# Patient Record
Sex: Female | Born: 1940 | Race: Black or African American | Hispanic: No | Marital: Married | Smoking: Former smoker
Health system: Southern US, Community
[De-identification: ages and names within clinical notes are randomized; demographics above are authoritative.]

## PROBLEM LIST (undated history)

## (undated) DIAGNOSIS — R0600 Dyspnea, unspecified: Secondary | ICD-10-CM

## (undated) DIAGNOSIS — K219 Gastro-esophageal reflux disease without esophagitis: Secondary | ICD-10-CM

## (undated) DIAGNOSIS — E785 Hyperlipidemia, unspecified: Secondary | ICD-10-CM

## (undated) DIAGNOSIS — I10 Essential (primary) hypertension: Secondary | ICD-10-CM

## (undated) DIAGNOSIS — J849 Interstitial pulmonary disease, unspecified: Secondary | ICD-10-CM

## (undated) DIAGNOSIS — M109 Gout, unspecified: Secondary | ICD-10-CM

## (undated) DIAGNOSIS — Z86010 Personal history of colonic polyps: Secondary | ICD-10-CM

## (undated) DIAGNOSIS — M1990 Unspecified osteoarthritis, unspecified site: Secondary | ICD-10-CM

## (undated) DIAGNOSIS — N189 Chronic kidney disease, unspecified: Secondary | ICD-10-CM

## (undated) DIAGNOSIS — D649 Anemia, unspecified: Secondary | ICD-10-CM

## (undated) DIAGNOSIS — R55 Syncope and collapse: Secondary | ICD-10-CM

## (undated) DIAGNOSIS — R011 Cardiac murmur, unspecified: Secondary | ICD-10-CM

## (undated) DIAGNOSIS — Q249 Congenital malformation of heart, unspecified: Secondary | ICD-10-CM

## (undated) DIAGNOSIS — I5042 Chronic combined systolic (congestive) and diastolic (congestive) heart failure: Secondary | ICD-10-CM

## (undated) DIAGNOSIS — J45909 Unspecified asthma, uncomplicated: Secondary | ICD-10-CM

## (undated) DIAGNOSIS — E538 Deficiency of other specified B group vitamins: Secondary | ICD-10-CM

## (undated) DIAGNOSIS — F419 Anxiety disorder, unspecified: Secondary | ICD-10-CM

## (undated) DIAGNOSIS — J84112 Idiopathic pulmonary fibrosis: Secondary | ICD-10-CM

## (undated) DIAGNOSIS — E669 Obesity, unspecified: Secondary | ICD-10-CM

## (undated) DIAGNOSIS — Z9889 Other specified postprocedural states: Secondary | ICD-10-CM

## (undated) DIAGNOSIS — E559 Vitamin D deficiency, unspecified: Secondary | ICD-10-CM

## (undated) DIAGNOSIS — I358 Other nonrheumatic aortic valve disorders: Secondary | ICD-10-CM

## (undated) DIAGNOSIS — M5136 Other intervertebral disc degeneration, lumbar region: Secondary | ICD-10-CM

## (undated) DIAGNOSIS — Z9289 Personal history of other medical treatment: Secondary | ICD-10-CM

## (undated) DIAGNOSIS — E119 Type 2 diabetes mellitus without complications: Secondary | ICD-10-CM

## (undated) DIAGNOSIS — I5032 Chronic diastolic (congestive) heart failure: Secondary | ICD-10-CM

## (undated) DIAGNOSIS — R0602 Shortness of breath: Secondary | ICD-10-CM

## (undated) DIAGNOSIS — I2510 Atherosclerotic heart disease of native coronary artery without angina pectoris: Secondary | ICD-10-CM

## (undated) HISTORY — DX: Personal history of colonic polyps: Z86.010

## (undated) HISTORY — DX: Syncope and collapse: R55

## (undated) HISTORY — DX: Personal history of other medical treatment: Z92.89

## (undated) HISTORY — DX: Vitamin D deficiency, unspecified: E55.9

## (undated) HISTORY — DX: Other specified postprocedural states: Z98.890

## (undated) HISTORY — PX: REPLACEMENT TOTAL KNEE: SUR1224

## (undated) HISTORY — DX: Obesity, unspecified: E66.9

## (undated) HISTORY — DX: Interstitial pulmonary disease, unspecified: J84.9

## (undated) HISTORY — PX: TOTAL KNEE ARTHROPLASTY: SHX125

## (undated) HISTORY — DX: Congenital malformation of heart, unspecified: Q24.9

## (undated) HISTORY — PX: EYE SURGERY: SHX253

## (undated) HISTORY — DX: Type 2 diabetes mellitus without complications: E11.9

## (undated) HISTORY — PX: JOINT REPLACEMENT: SHX530

## (undated) HISTORY — DX: Dyspnea, unspecified: R06.00

## (undated) HISTORY — PX: BREAST BIOPSY: SHX20

## (undated) HISTORY — DX: Shortness of breath: R06.02

## (undated) HISTORY — DX: Hyperlipidemia, unspecified: E78.5

## (undated) HISTORY — PX: REVISION TOTAL KNEE ARTHROPLASTY: SHX767

## (undated) HISTORY — PX: TONSILLECTOMY: SUR1361

## (undated) HISTORY — DX: Other nonrheumatic aortic valve disorders: I35.8

---

## 2003-04-21 ENCOUNTER — Other Ambulatory Visit: Payer: Self-pay

## 2004-04-10 DIAGNOSIS — M5136 Other intervertebral disc degeneration, lumbar region: Secondary | ICD-10-CM

## 2004-04-10 DIAGNOSIS — Z9889 Other specified postprocedural states: Secondary | ICD-10-CM

## 2004-04-10 DIAGNOSIS — M51369 Other intervertebral disc degeneration, lumbar region without mention of lumbar back pain or lower extremity pain: Secondary | ICD-10-CM

## 2004-04-10 HISTORY — DX: Other specified postprocedural states: Z98.890

## 2004-04-10 HISTORY — PX: BACK SURGERY: SHX140

## 2004-04-10 HISTORY — DX: Other intervertebral disc degeneration, lumbar region: M51.36

## 2004-04-10 HISTORY — PX: CARDIAC CATHETERIZATION: SHX172

## 2004-04-10 HISTORY — DX: Other intervertebral disc degeneration, lumbar region without mention of lumbar back pain or lower extremity pain: M51.369

## 2004-04-12 ENCOUNTER — Ambulatory Visit: Payer: Self-pay | Admitting: Family Medicine

## 2004-09-22 ENCOUNTER — Ambulatory Visit: Payer: Self-pay | Admitting: General Practice

## 2005-10-30 ENCOUNTER — Emergency Department: Payer: Self-pay | Admitting: Emergency Medicine

## 2006-06-06 ENCOUNTER — Ambulatory Visit: Payer: Self-pay

## 2007-04-12 ENCOUNTER — Ambulatory Visit: Payer: Self-pay | Admitting: Family Medicine

## 2007-05-23 ENCOUNTER — Ambulatory Visit: Payer: Self-pay | Admitting: Family Medicine

## 2007-09-02 ENCOUNTER — Emergency Department: Payer: Self-pay | Admitting: Emergency Medicine

## 2008-05-27 DIAGNOSIS — Z9289 Personal history of other medical treatment: Secondary | ICD-10-CM

## 2008-05-27 HISTORY — DX: Personal history of other medical treatment: Z92.89

## 2008-09-30 ENCOUNTER — Ambulatory Visit: Payer: Self-pay | Admitting: Family Medicine

## 2008-12-09 ENCOUNTER — Ambulatory Visit: Payer: Self-pay | Admitting: Ophthalmology

## 2009-09-23 DIAGNOSIS — Z9289 Personal history of other medical treatment: Secondary | ICD-10-CM

## 2009-09-23 HISTORY — DX: Personal history of other medical treatment: Z92.89

## 2009-10-05 ENCOUNTER — Ambulatory Visit: Payer: Self-pay | Admitting: General Practice

## 2009-10-18 ENCOUNTER — Inpatient Hospital Stay: Payer: Self-pay | Admitting: General Practice

## 2010-01-05 ENCOUNTER — Ambulatory Visit: Payer: Self-pay | Admitting: Ophthalmology

## 2010-06-11 ENCOUNTER — Emergency Department: Payer: Self-pay | Admitting: Emergency Medicine

## 2010-07-27 ENCOUNTER — Ambulatory Visit: Payer: Self-pay | Admitting: Family Medicine

## 2010-10-28 DIAGNOSIS — Z860100 Personal history of colon polyps, unspecified: Secondary | ICD-10-CM

## 2010-10-28 DIAGNOSIS — D649 Anemia, unspecified: Secondary | ICD-10-CM

## 2010-10-28 DIAGNOSIS — E119 Type 2 diabetes mellitus without complications: Secondary | ICD-10-CM

## 2010-10-28 DIAGNOSIS — E1165 Type 2 diabetes mellitus with hyperglycemia: Secondary | ICD-10-CM | POA: Insufficient documentation

## 2010-10-28 DIAGNOSIS — E538 Deficiency of other specified B group vitamins: Secondary | ICD-10-CM | POA: Insufficient documentation

## 2010-10-28 DIAGNOSIS — I2510 Atherosclerotic heart disease of native coronary artery without angina pectoris: Secondary | ICD-10-CM

## 2010-10-28 DIAGNOSIS — D638 Anemia in other chronic diseases classified elsewhere: Secondary | ICD-10-CM | POA: Insufficient documentation

## 2010-10-28 DIAGNOSIS — M1712 Unilateral primary osteoarthritis, left knee: Secondary | ICD-10-CM | POA: Insufficient documentation

## 2010-10-28 DIAGNOSIS — E785 Hyperlipidemia, unspecified: Secondary | ICD-10-CM | POA: Insufficient documentation

## 2010-10-28 DIAGNOSIS — M1990 Unspecified osteoarthritis, unspecified site: Secondary | ICD-10-CM

## 2010-10-28 DIAGNOSIS — I10 Essential (primary) hypertension: Secondary | ICD-10-CM

## 2010-10-28 DIAGNOSIS — M109 Gout, unspecified: Secondary | ICD-10-CM

## 2010-10-28 DIAGNOSIS — E669 Obesity, unspecified: Secondary | ICD-10-CM

## 2010-10-28 HISTORY — DX: Type 2 diabetes mellitus without complications: E11.9

## 2010-10-28 HISTORY — DX: Gout, unspecified: M10.9

## 2010-10-28 HISTORY — DX: Deficiency of other specified B group vitamins: E53.8

## 2010-10-28 HISTORY — DX: Anemia, unspecified: D64.9

## 2010-10-28 HISTORY — DX: Hyperlipidemia, unspecified: E78.5

## 2010-10-28 HISTORY — DX: Unspecified osteoarthritis, unspecified site: M19.90

## 2010-10-28 HISTORY — DX: Essential (primary) hypertension: I10

## 2010-10-28 HISTORY — DX: Obesity, unspecified: E66.9

## 2010-10-28 HISTORY — DX: Personal history of colonic polyps: Z86.010

## 2010-10-28 HISTORY — DX: Atherosclerotic heart disease of native coronary artery without angina pectoris: I25.10

## 2010-10-28 HISTORY — DX: Personal history of colon polyps, unspecified: Z86.0100

## 2011-02-22 DIAGNOSIS — E559 Vitamin D deficiency, unspecified: Secondary | ICD-10-CM

## 2011-02-22 HISTORY — DX: Vitamin D deficiency, unspecified: E55.9

## 2011-04-11 DIAGNOSIS — R55 Syncope and collapse: Secondary | ICD-10-CM

## 2011-04-11 HISTORY — DX: Syncope and collapse: R55

## 2011-06-18 ENCOUNTER — Emergency Department: Payer: Self-pay | Admitting: *Deleted

## 2011-08-01 ENCOUNTER — Ambulatory Visit: Payer: Self-pay | Admitting: Family Medicine

## 2012-10-25 ENCOUNTER — Ambulatory Visit: Payer: Self-pay | Admitting: Family Medicine

## 2012-11-07 ENCOUNTER — Ambulatory Visit: Payer: Self-pay | Admitting: Ophthalmology

## 2013-08-26 ENCOUNTER — Emergency Department: Payer: Self-pay | Admitting: Emergency Medicine

## 2013-12-02 DIAGNOSIS — N1832 Chronic kidney disease, stage 3b: Secondary | ICD-10-CM | POA: Insufficient documentation

## 2013-12-16 DIAGNOSIS — J8489 Other specified interstitial pulmonary diseases: Secondary | ICD-10-CM | POA: Insufficient documentation

## 2013-12-26 HISTORY — PX: TOTAL HIP ARTHROPLASTY: SHX124

## 2014-02-23 ENCOUNTER — Ambulatory Visit: Payer: Self-pay | Admitting: Family Medicine

## 2015-08-06 ENCOUNTER — Other Ambulatory Visit: Payer: Self-pay | Admitting: Family Medicine

## 2015-08-06 DIAGNOSIS — Z1231 Encounter for screening mammogram for malignant neoplasm of breast: Secondary | ICD-10-CM

## 2015-08-16 ENCOUNTER — Ambulatory Visit: Payer: Self-pay | Attending: Family Medicine

## 2015-10-21 ENCOUNTER — Ambulatory Visit: Admission: RE | Admit: 2015-10-21 | Payer: Self-pay | Source: Ambulatory Visit

## 2015-12-15 ENCOUNTER — Ambulatory Visit: Admission: RE | Admit: 2015-12-15 | Payer: Self-pay | Source: Ambulatory Visit

## 2015-12-23 ENCOUNTER — Ambulatory Visit
Admission: RE | Admit: 2015-12-23 | Discharge: 2015-12-23 | Disposition: A | Discharge status: Home / Self Care | Payer: Medicare HMO | Source: Ambulatory Visit | Attending: Family Medicine | Admitting: Family Medicine

## 2015-12-23 ENCOUNTER — Other Ambulatory Visit: Payer: Self-pay | Admitting: Family Medicine

## 2015-12-23 DIAGNOSIS — Z1231 Encounter for screening mammogram for malignant neoplasm of breast: Secondary | ICD-10-CM | POA: Diagnosis present

## 2016-11-30 ENCOUNTER — Encounter
Admission: RE | Admit: 2016-11-30 | Discharge: 2016-11-30 | Disposition: A | Discharge status: Home / Self Care | Payer: Medicare HMO | Source: Ambulatory Visit | Attending: Orthopedic Surgery | Admitting: Orthopedic Surgery

## 2016-11-30 DIAGNOSIS — Z0183 Encounter for blood typing: Secondary | ICD-10-CM | POA: Insufficient documentation

## 2016-11-30 DIAGNOSIS — Z01812 Encounter for preprocedural laboratory examination: Secondary | ICD-10-CM | POA: Insufficient documentation

## 2016-11-30 DIAGNOSIS — M1611 Unilateral primary osteoarthritis, right hip: Secondary | ICD-10-CM | POA: Insufficient documentation

## 2016-11-30 HISTORY — DX: Other intervertebral disc degeneration, lumbar region: M51.36

## 2016-11-30 HISTORY — DX: Unspecified osteoarthritis, unspecified site: M19.90

## 2016-11-30 HISTORY — DX: Type 2 diabetes mellitus without complications: E11.9

## 2016-11-30 HISTORY — DX: Chronic kidney disease, unspecified: N18.9

## 2016-11-30 HISTORY — DX: Anemia, unspecified: D64.9

## 2016-11-30 HISTORY — DX: Deficiency of other specified B group vitamins: E53.8

## 2016-11-30 HISTORY — DX: Atherosclerotic heart disease of native coronary artery without angina pectoris: I25.10

## 2016-11-30 HISTORY — DX: Cardiac murmur, unspecified: R01.1

## 2016-11-30 HISTORY — DX: Gout, unspecified: M10.9

## 2016-11-30 HISTORY — DX: Anxiety disorder, unspecified: F41.9

## 2016-11-30 HISTORY — DX: Gastro-esophageal reflux disease without esophagitis: K21.9

## 2016-11-30 HISTORY — DX: Unspecified asthma, uncomplicated: J45.909

## 2016-11-30 HISTORY — DX: Hyperlipidemia, unspecified: E78.5

## 2016-11-30 HISTORY — DX: Essential (primary) hypertension: I10

## 2016-11-30 LAB — CBC
HEMATOCRIT: 34 % — AB (ref 35.0–47.0)
HEMOGLOBIN: 11.4 g/dL — AB (ref 12.0–16.0)
MCH: 33.3 pg (ref 26.0–34.0)
MCHC: 33.5 g/dL (ref 32.0–36.0)
MCV: 99.5 fL (ref 80.0–100.0)
Platelets: 260 10*3/uL (ref 150–440)
RBC: 3.42 MIL/uL — AB (ref 3.80–5.20)
RDW: 14.6 % — ABNORMAL HIGH (ref 11.5–14.5)
WBC: 6.4 10*3/uL (ref 3.6–11.0)

## 2016-11-30 LAB — TYPE AND SCREEN
ABO/RH(D): O POS
ANTIBODY SCREEN: NEGATIVE

## 2016-11-30 LAB — PROTIME-INR
INR: 1.13
Prothrombin Time: 14.6 seconds (ref 11.4–15.2)

## 2016-11-30 LAB — BASIC METABOLIC PANEL
Anion gap: 8 (ref 5–15)
BUN: 20 mg/dL (ref 6–20)
CHLORIDE: 106 mmol/L (ref 101–111)
CO2: 27 mmol/L (ref 22–32)
CREATININE: 1.12 mg/dL — AB (ref 0.44–1.00)
Calcium: 10.1 mg/dL (ref 8.9–10.3)
GFR calc non Af Amer: 46 mL/min — ABNORMAL LOW (ref >60)
GFR, EST AFRICAN AMERICAN: 53 mL/min — AB (ref >60)
GLUCOSE: 124 mg/dL — AB (ref 65–99)
Potassium: 4.7 mmol/L (ref 3.5–5.1)
Sodium: 141 mmol/L (ref 135–145)

## 2016-11-30 LAB — URINALYSIS, COMPLETE (UACMP) WITH MICROSCOPIC
BACTERIA UA: NONE SEEN
BILIRUBIN URINE: NEGATIVE
GLUCOSE, UA: NEGATIVE mg/dL
Hgb urine dipstick: NEGATIVE
Ketones, ur: NEGATIVE mg/dL
Leukocytes, UA: NEGATIVE
NITRITE: NEGATIVE
PH: 6 (ref 5.0–8.0)
Protein, ur: NEGATIVE mg/dL
SPECIFIC GRAVITY, URINE: 1.012 (ref 1.005–1.030)

## 2016-11-30 LAB — SURGICAL PCR SCREEN
MRSA, PCR: NEGATIVE
Staphylococcus aureus: NEGATIVE

## 2016-11-30 LAB — APTT: aPTT: 29 seconds (ref 24–36)

## 2016-11-30 LAB — SEDIMENTATION RATE: Sed Rate: 28 mm/hr (ref 0–30)

## 2016-11-30 NOTE — Patient Instructions (Signed)
Your procedure is scheduled on: December 12, 2016 (TUESDAY) Report to Same Day Surgery 2nd floor medical mall [facility] Entrance-take elevator on left to 2nd floor.  Check in with surgery information desk.) To find out your arrival time please call [phone] between 1PM - 3PM on December 08, 2016 (FRIDAY ) Remember: Instructions that are not followed completely may result in serious medical risk, up to and including death, or upon the discretion of your surgeon and anesthesiologist your surgery may need to be rescheduled.    _x___ 1. Do not eat food or drink liquids after midnight. No gum chewing or hard candies                               __x__ 2. No Alcohol for 24 hours before or after surgery.   __x__3. No Smoking for 24 prior to surgery.   ____  4. Bring all medications with you on the day of surgery if instructed.    __x__ 5. Notify your doctor if there is any change in your medical condition     (cold, fever, infections).     Do not wear jewelry, make-up, hairpins, clips or nail polish.  Do not wear lotions, powders, or perfumes.   Do not shave 48 hours prior to surgery. Men may shave face and neck.  Do not bring valuables to the hospital.    [facility] is not responsible for any belongings or valuables.               Contacts, dentures or bridgework may not be worn into surgery.  Leave your suitcase in the car. After surgery it may be brought to your room.  For patients admitted to the hospital, discharge time is determined by your treatment team                     Patients discharged the day of surgery will not be allowed to drive home.  You will need someone to drive you home and stay with you the night of your procedure.    Please read over the following fact sheets that you were given:   [facility] Preparing for Surgery and or MRSA Information   Take the following medications with a sip of water the morning of surgery :  1. GABAPENTIN  2. LISINOPRIL  3.  METOPROLOL  4. RANITIDINE  5.  6.  ____Fleets enema or Magnesium Citrate as directed.   _x___ Use CHG Soap or sage wipes as directed on instruction sheet   __x__ Use inhalers on the day of surgery and bring to hospital day of surgery (USE ADVAIR AND VENTOLIN  INHALERS THE MORNING OF SURGERY AND BRING INHALERS WITH YOU TO HOSPITAL )  _X___ Stop Metformin  2 days prior to surgery.(STOP METFORMIN ON SEPTEMBER   2 )    ____ Take 1/2 of usual insulin dose the night before surgery and none on the morning surgery     _x___ Follow recommendations from Cardiologist, Pulmonologist or PCP regarding          stopping Aspirin, Coumadin, Plavix ,Eliquis, Effient, or Pradaxa, and Pletal. (STOP ASPIRIN ONE WEEK BEFORE SURGERY )  X____Stop Anti-inflammatories such as Advil, Aleve, Ibuprofen, Motrin, Naproxen, Naprosyn, Goodies powders or aspirin products. OK to take Tylenol    _x___ Stop supplements until after surgery.  But may continue Vitamin D, Vitamin B,  And multivitamin  (STOP CINNAMON AND  OTHER HERBAL SUPPLEMENTS )      ____ Bring C-Pap to the hospital.

## 2016-11-30 NOTE — Pre-Procedure Instructions (Signed)
Component Name Value Ref Range  Vent Rate (bpm) 63   PR Interval (msec) 172   QRS Interval (msec) 78   QT Interval (msec) 416   QTc (msec) 425   Result Narrative  Normal sinus rhythm Minimal voltage criteria for LVH, may be normal variant When compared with ECG of 10-Sep-2013 09:04, Inverted T waves have replaced nonspecific T wave abnormality in Inferior leads I reviewed and concur with this report. Electronically signed by:HENKE MD, ELIZABETH ([id number]) on 11/21/2016 7:33:35 PM  Status

## 2016-12-01 LAB — URINE CULTURE

## 2016-12-02 NOTE — Pre-Procedure Instructions (Signed)
Urine culture results sent to Dr. Menz for review.  Asked if wanted it recollected as suggested? 

## 2016-12-11 MED ORDER — CEFAZOLIN SODIUM-DEXTROSE 2-4 GM/100ML-% IV SOLN
2.0000 g | Freq: Once | INTRAVENOUS | Status: AC
  Administered 2016-12-12: 2 g via INTRAVENOUS

## 2016-12-12 ENCOUNTER — Inpatient Hospital Stay: Payer: Medicare HMO | Admitting: Anesthesiology

## 2016-12-12 ENCOUNTER — Inpatient Hospital Stay: Payer: Medicare HMO

## 2016-12-12 ENCOUNTER — Encounter
Admission: RE | Disposition: A | Discharge status: Skilled Nursing Facility | Payer: Self-pay | Source: Ambulatory Visit | Attending: Orthopedic Surgery

## 2016-12-12 ENCOUNTER — Inpatient Hospital Stay
Admission: RE | Admit: 2016-12-12 | Discharge: 2016-12-14 | DRG: 470 | Disposition: A | Discharge status: Skilled Nursing Facility | Payer: Medicare HMO | Source: Ambulatory Visit | Attending: Orthopedic Surgery | Admitting: Orthopedic Surgery

## 2016-12-12 ENCOUNTER — Encounter: Payer: Self-pay | Admitting: *Deleted

## 2016-12-12 DIAGNOSIS — M1611 Unilateral primary osteoarthritis, right hip: Secondary | ICD-10-CM | POA: Diagnosis present

## 2016-12-12 DIAGNOSIS — G8918 Other acute postprocedural pain: Secondary | ICD-10-CM

## 2016-12-12 DIAGNOSIS — Z79899 Other long term (current) drug therapy: Secondary | ICD-10-CM | POA: Diagnosis not present

## 2016-12-12 DIAGNOSIS — I129 Hypertensive chronic kidney disease with stage 1 through stage 4 chronic kidney disease, or unspecified chronic kidney disease: Secondary | ICD-10-CM | POA: Diagnosis present

## 2016-12-12 DIAGNOSIS — F419 Anxiety disorder, unspecified: Secondary | ICD-10-CM | POA: Diagnosis present

## 2016-12-12 DIAGNOSIS — Z885 Allergy status to narcotic agent status: Secondary | ICD-10-CM

## 2016-12-12 DIAGNOSIS — Z833 Family history of diabetes mellitus: Secondary | ICD-10-CM

## 2016-12-12 DIAGNOSIS — J45909 Unspecified asthma, uncomplicated: Secondary | ICD-10-CM | POA: Diagnosis present

## 2016-12-12 DIAGNOSIS — T40605A Adverse effect of unspecified narcotics, initial encounter: Secondary | ICD-10-CM | POA: Diagnosis not present

## 2016-12-12 DIAGNOSIS — E559 Vitamin D deficiency, unspecified: Secondary | ICD-10-CM | POA: Diagnosis present

## 2016-12-12 DIAGNOSIS — D62 Acute posthemorrhagic anemia: Secondary | ICD-10-CM | POA: Diagnosis not present

## 2016-12-12 DIAGNOSIS — K219 Gastro-esophageal reflux disease without esophagitis: Secondary | ICD-10-CM | POA: Diagnosis present

## 2016-12-12 DIAGNOSIS — E1122 Type 2 diabetes mellitus with diabetic chronic kidney disease: Secondary | ICD-10-CM | POA: Diagnosis present

## 2016-12-12 DIAGNOSIS — Z7982 Long term (current) use of aspirin: Secondary | ICD-10-CM | POA: Diagnosis not present

## 2016-12-12 DIAGNOSIS — Z87891 Personal history of nicotine dependence: Secondary | ICD-10-CM

## 2016-12-12 DIAGNOSIS — R011 Cardiac murmur, unspecified: Secondary | ICD-10-CM | POA: Diagnosis present

## 2016-12-12 DIAGNOSIS — Z972 Presence of dental prosthetic device (complete) (partial): Secondary | ICD-10-CM

## 2016-12-12 DIAGNOSIS — M5136 Other intervertebral disc degeneration, lumbar region: Secondary | ICD-10-CM | POA: Diagnosis present

## 2016-12-12 DIAGNOSIS — Z7984 Long term (current) use of oral hypoglycemic drugs: Secondary | ICD-10-CM

## 2016-12-12 DIAGNOSIS — M109 Gout, unspecified: Secondary | ICD-10-CM | POA: Diagnosis present

## 2016-12-12 DIAGNOSIS — E785 Hyperlipidemia, unspecified: Secondary | ICD-10-CM | POA: Diagnosis present

## 2016-12-12 DIAGNOSIS — D631 Anemia in chronic kidney disease: Secondary | ICD-10-CM | POA: Diagnosis present

## 2016-12-12 DIAGNOSIS — N183 Chronic kidney disease, stage 3 (moderate): Secondary | ICD-10-CM | POA: Diagnosis present

## 2016-12-12 DIAGNOSIS — Z419 Encounter for procedure for purposes other than remedying health state, unspecified: Secondary | ICD-10-CM

## 2016-12-12 DIAGNOSIS — I2510 Atherosclerotic heart disease of native coronary artery without angina pectoris: Secondary | ICD-10-CM | POA: Diagnosis present

## 2016-12-12 DIAGNOSIS — R112 Nausea with vomiting, unspecified: Secondary | ICD-10-CM | POA: Diagnosis not present

## 2016-12-12 DIAGNOSIS — Z96642 Presence of left artificial hip joint: Secondary | ICD-10-CM | POA: Diagnosis present

## 2016-12-12 DIAGNOSIS — E538 Deficiency of other specified B group vitamins: Secondary | ICD-10-CM | POA: Diagnosis present

## 2016-12-12 DIAGNOSIS — Z823 Family history of stroke: Secondary | ICD-10-CM

## 2016-12-12 HISTORY — PX: TOTAL HIP ARTHROPLASTY: SHX124

## 2016-12-12 LAB — CBC
HEMATOCRIT: 32.4 % — AB (ref 35.0–47.0)
HEMOGLOBIN: 10.8 g/dL — AB (ref 12.0–16.0)
MCH: 32.5 pg (ref 26.0–34.0)
MCHC: 33.2 g/dL (ref 32.0–36.0)
MCV: 97.8 fL (ref 80.0–100.0)
Platelets: 271 10*3/uL (ref 150–440)
RBC: 3.32 MIL/uL — ABNORMAL LOW (ref 3.80–5.20)
RDW: 13.8 % (ref 11.5–14.5)
WBC: 13.1 10*3/uL — AB (ref 3.6–11.0)

## 2016-12-12 LAB — GLUCOSE, CAPILLARY
GLUCOSE-CAPILLARY: 196 mg/dL — AB (ref 65–99)
GLUCOSE-CAPILLARY: 200 mg/dL — AB (ref 65–99)
Glucose-Capillary: 132 mg/dL — ABNORMAL HIGH (ref 65–99)
Glucose-Capillary: 163 mg/dL — ABNORMAL HIGH (ref 65–99)
Glucose-Capillary: 145 mg/dL — ABNORMAL HIGH (ref 65–99)

## 2016-12-12 LAB — ABO/RH: ABO/RH(D): O POS

## 2016-12-12 LAB — CREATININE, SERUM
CREATININE: 1.2 mg/dL — AB (ref 0.44–1.00)
GFR, EST AFRICAN AMERICAN: 49 mL/min — AB (ref >60)
GFR, EST NON AFRICAN AMERICAN: 42 mL/min — AB (ref >60)

## 2016-12-12 SURGERY — ARTHROPLASTY, HIP, TOTAL, ANTERIOR APPROACH
Anesthesia: General | Site: Hip | Laterality: Right | Wound class: Clean

## 2016-12-12 MED ORDER — NEOMYCIN-POLYMYXIN B GU 40-200000 IR SOLN
Status: AC
  Filled 2016-12-12: qty 4

## 2016-12-12 MED ORDER — ONDANSETRON HCL 4 MG/2ML IJ SOLN
INTRAMUSCULAR | Status: AC
  Filled 2016-12-12: qty 2

## 2016-12-12 MED ORDER — BUPIVACAINE-EPINEPHRINE 0.25% -1:200000 IJ SOLN
INTRAMUSCULAR | Status: DC | PRN
  Administered 2016-12-12: 30 mL

## 2016-12-12 MED ORDER — INSULIN ASPART 100 UNIT/ML ~~LOC~~ SOLN
0.0000 [IU] | Freq: Three times a day (TID) | SUBCUTANEOUS | Status: DC
  Administered 2016-12-12 (×2): 3 [IU] via SUBCUTANEOUS
  Administered 2016-12-13 (×3): 2 [IU] via SUBCUTANEOUS
  Administered 2016-12-14: 3 [IU] via SUBCUTANEOUS
  Filled 2016-12-12 (×6): qty 1

## 2016-12-12 MED ORDER — FAMOTIDINE 20 MG PO TABS
20.0000 mg | ORAL_TABLET | Freq: Every day | ORAL | Status: DC
  Administered 2016-12-13 – 2016-12-14 (×2): 20 mg via ORAL
  Filled 2016-12-12 (×2): qty 1

## 2016-12-12 MED ORDER — BUPIVACAINE-EPINEPHRINE (PF) 0.25% -1:200000 IJ SOLN
INTRAMUSCULAR | Status: AC
  Filled 2016-12-12: qty 30

## 2016-12-12 MED ORDER — BISACODYL 10 MG RE SUPP: 10.0000 mg | Freq: Every day | RECTAL | Status: DC | PRN

## 2016-12-12 MED ORDER — ALUM & MAG HYDROXIDE-SIMETH 200-200-20 MG/5ML PO SUSP: 30.0000 mL | ORAL | Status: DC | PRN

## 2016-12-12 MED ORDER — ONDANSETRON HCL 4 MG/2ML IJ SOLN
INTRAMUSCULAR | Status: DC | PRN
  Administered 2016-12-12: 4 mg via INTRAVENOUS

## 2016-12-12 MED ORDER — ROCURONIUM BROMIDE 100 MG/10ML IV SOLN
INTRAVENOUS | Status: DC | PRN
  Administered 2016-12-12: 10 mg via INTRAVENOUS
  Administered 2016-12-12: 40 mg via INTRAVENOUS

## 2016-12-12 MED ORDER — ONDANSETRON HCL 4 MG/2ML IJ SOLN: 4.0000 mg | Freq: Once | INTRAMUSCULAR | Status: DC | PRN

## 2016-12-12 MED ORDER — SUGAMMADEX SODIUM 200 MG/2ML IV SOLN
INTRAVENOUS | Status: AC
  Filled 2016-12-12: qty 2

## 2016-12-12 MED ORDER — MENTHOL 3 MG MT LOZG
1.0000 | LOZENGE | OROMUCOSAL | Status: DC | PRN
  Filled 2016-12-12: qty 9

## 2016-12-12 MED ORDER — FENTANYL CITRATE (PF) 100 MCG/2ML IJ SOLN
INTRAMUSCULAR | Status: DC | PRN
  Administered 2016-12-12 (×5): 50 ug via INTRAVENOUS

## 2016-12-12 MED ORDER — ALBUTEROL SULFATE (2.5 MG/3ML) 0.083% IN NEBU: 2.5000 mg | INHALATION_SOLUTION | Freq: Four times a day (QID) | RESPIRATORY_TRACT | Status: DC | PRN

## 2016-12-12 MED ORDER — SODIUM CHLORIDE 0.9 % IV SOLN
INTRAVENOUS | Status: DC
  Administered 2016-12-12: 07:00:00 via INTRAVENOUS

## 2016-12-12 MED ORDER — TRANEXAMIC ACID 1000 MG/10ML IV SOLN
INTRAVENOUS | Status: AC
  Filled 2016-12-12: qty 10

## 2016-12-12 MED ORDER — FENTANYL CITRATE (PF) 100 MCG/2ML IJ SOLN
INTRAMUSCULAR | Status: AC
  Filled 2016-12-12: qty 2

## 2016-12-12 MED ORDER — ACETAMINOPHEN 10 MG/ML IV SOLN
INTRAVENOUS | Status: AC
  Filled 2016-12-12: qty 100

## 2016-12-12 MED ORDER — SODIUM CHLORIDE 0.9 % IV SOLN
INTRAVENOUS | Status: DC | PRN
  Administered 2016-12-12: 50 ug/min via INTRAVENOUS

## 2016-12-12 MED ORDER — LIDOCAINE HCL (PF) 2 % IJ SOLN
INTRAMUSCULAR | Status: AC
  Filled 2016-12-12: qty 2

## 2016-12-12 MED ORDER — FLUTICASONE FUROATE-VILANTEROL 200-25 MCG/INH IN AEPB
1.0000 | INHALATION_SPRAY | Freq: Every day | RESPIRATORY_TRACT | Status: DC
  Administered 2016-12-13 – 2016-12-14 (×2): 1 via RESPIRATORY_TRACT
  Filled 2016-12-12: qty 28

## 2016-12-12 MED ORDER — SODIUM CHLORIDE 0.9 % IV SOLN
INTRAVENOUS | Status: DC
  Administered 2016-12-12 (×2): via INTRAVENOUS

## 2016-12-12 MED ORDER — FENTANYL CITRATE (PF) 250 MCG/5ML IJ SOLN
INTRAMUSCULAR | Status: AC
  Filled 2016-12-12: qty 5

## 2016-12-12 MED ORDER — MIDAZOLAM HCL 2 MG/2ML IJ SOLN
INTRAMUSCULAR | Status: AC
  Filled 2016-12-12: qty 2

## 2016-12-12 MED ORDER — VITAMIN D 1000 UNITS PO TABS
1000.0000 [IU] | ORAL_TABLET | Freq: Every day | ORAL | Status: DC
  Administered 2016-12-13 – 2016-12-14 (×2): 1000 [IU] via ORAL
  Filled 2016-12-12 (×2): qty 1

## 2016-12-12 MED ORDER — ENOXAPARIN SODIUM 40 MG/0.4ML ~~LOC~~ SOLN
40.0000 mg | SUBCUTANEOUS | Status: DC
  Administered 2016-12-13 – 2016-12-14 (×2): 40 mg via SUBCUTANEOUS
  Filled 2016-12-12 (×2): qty 0.4

## 2016-12-12 MED ORDER — SCOPOLAMINE 1 MG/3DAYS TD PT72
1.0000 | MEDICATED_PATCH | TRANSDERMAL | Status: DC
  Administered 2016-12-12: 1.5 mg via TRANSDERMAL
  Filled 2016-12-12: qty 1

## 2016-12-12 MED ORDER — LIDOCAINE HCL (CARDIAC) 20 MG/ML IV SOLN
INTRAVENOUS | Status: DC | PRN
  Administered 2016-12-12: 100 mg via INTRAVENOUS

## 2016-12-12 MED ORDER — METOPROLOL SUCCINATE ER 25 MG PO TB24
25.0000 mg | ORAL_TABLET | Freq: Every day | ORAL | Status: DC
  Administered 2016-12-14: 25 mg via ORAL
  Filled 2016-12-12: qty 1

## 2016-12-12 MED ORDER — METHOCARBAMOL 500 MG PO TABS
500.0000 mg | ORAL_TABLET | Freq: Four times a day (QID) | ORAL | Status: DC | PRN
  Administered 2016-12-12 – 2016-12-13 (×2): 500 mg via ORAL
  Filled 2016-12-12 (×2): qty 1

## 2016-12-12 MED ORDER — SUGAMMADEX SODIUM 200 MG/2ML IV SOLN
INTRAVENOUS | Status: DC | PRN
  Administered 2016-12-12: 195 mg via INTRAVENOUS

## 2016-12-12 MED ORDER — HYDROMORPHONE HCL 1 MG/ML IJ SOLN: 0.5000 mg | INTRAMUSCULAR | Status: DC | PRN

## 2016-12-12 MED ORDER — DIPHENHYDRAMINE HCL 12.5 MG/5ML PO ELIX
12.5000 mg | ORAL_SOLUTION | ORAL | Status: DC | PRN
  Administered 2016-12-12 – 2016-12-14 (×5): 12.5 mg via ORAL
  Filled 2016-12-12 (×3): qty 5
  Filled 2016-12-12: qty 10
  Filled 2016-12-12: qty 5

## 2016-12-12 MED ORDER — ACETAMINOPHEN 650 MG RE SUPP: 650.0000 mg | Freq: Four times a day (QID) | RECTAL | Status: DC | PRN

## 2016-12-12 MED ORDER — TRANEXAMIC ACID 1000 MG/10ML IV SOLN
INTRAVENOUS | Status: DC | PRN
  Administered 2016-12-12: 1000 mg via INTRAVENOUS

## 2016-12-12 MED ORDER — DOCUSATE SODIUM 100 MG PO CAPS
100.0000 mg | ORAL_CAPSULE | Freq: Two times a day (BID) | ORAL | Status: DC
  Administered 2016-12-12 – 2016-12-14 (×4): 100 mg via ORAL
  Filled 2016-12-12 (×4): qty 1

## 2016-12-12 MED ORDER — ALLOPURINOL 300 MG PO TABS
300.0000 mg | ORAL_TABLET | Freq: Every day | ORAL | Status: DC
  Administered 2016-12-13 – 2016-12-14 (×2): 300 mg via ORAL
  Filled 2016-12-12 (×3): qty 1

## 2016-12-12 MED ORDER — PROPOFOL 10 MG/ML IV BOLUS
INTRAVENOUS | Status: DC | PRN
  Administered 2016-12-12: 120 mg via INTRAVENOUS

## 2016-12-12 MED ORDER — ONDANSETRON HCL 4 MG PO TABS: 4.0000 mg | ORAL_TABLET | Freq: Four times a day (QID) | ORAL | Status: DC | PRN

## 2016-12-12 MED ORDER — DEXAMETHASONE SODIUM PHOSPHATE 10 MG/ML IJ SOLN
INTRAMUSCULAR | Status: AC
  Filled 2016-12-12: qty 1

## 2016-12-12 MED ORDER — METHOCARBAMOL 1000 MG/10ML IJ SOLN
500.0000 mg | Freq: Four times a day (QID) | INTRAVENOUS | Status: DC | PRN
  Filled 2016-12-12: qty 5

## 2016-12-12 MED ORDER — PROPOFOL 500 MG/50ML IV EMUL
INTRAVENOUS | Status: AC
  Filled 2016-12-12: qty 50

## 2016-12-12 MED ORDER — ONDANSETRON HCL 4 MG/2ML IJ SOLN
4.0000 mg | Freq: Four times a day (QID) | INTRAMUSCULAR | Status: DC | PRN
  Administered 2016-12-12 (×2): 4 mg via INTRAVENOUS
  Filled 2016-12-12 (×2): qty 2

## 2016-12-12 MED ORDER — METFORMIN HCL 500 MG PO TABS
1000.0000 mg | ORAL_TABLET | Freq: Every day | ORAL | Status: DC
Start: 2016-12-12 — End: 2016-12-14
  Administered 2016-12-13: 1000 mg via ORAL
  Filled 2016-12-12 (×3): qty 2

## 2016-12-12 MED ORDER — LACTATED RINGERS IV SOLN: INTRAVENOUS | Status: DC | PRN

## 2016-12-12 MED ORDER — OMEGA-3-ACID ETHYL ESTERS 1 G PO CAPS
1.0000 g | ORAL_CAPSULE | Freq: Every day | ORAL | Status: DC
  Administered 2016-12-13 – 2016-12-14 (×2): 1 g via ORAL
  Filled 2016-12-12 (×2): qty 1

## 2016-12-12 MED ORDER — METOCLOPRAMIDE HCL 10 MG PO TABS: 5.0000 mg | ORAL_TABLET | Freq: Three times a day (TID) | ORAL | Status: DC | PRN

## 2016-12-12 MED ORDER — ACETAMINOPHEN 325 MG PO TABS
650.0000 mg | ORAL_TABLET | Freq: Four times a day (QID) | ORAL | Status: DC | PRN
  Administered 2016-12-12 – 2016-12-14 (×6): 650 mg via ORAL
  Filled 2016-12-12 (×6): qty 2

## 2016-12-12 MED ORDER — ACETAMINOPHEN 10 MG/ML IV SOLN
INTRAVENOUS | Status: DC | PRN
  Administered 2016-12-12: 1000 mg via INTRAVENOUS

## 2016-12-12 MED ORDER — METOCLOPRAMIDE HCL 5 MG/ML IJ SOLN: 5.0000 mg | Freq: Three times a day (TID) | INTRAMUSCULAR | Status: DC | PRN

## 2016-12-12 MED ORDER — PHENYLEPHRINE HCL 10 MG/ML IJ SOLN
INTRAMUSCULAR | Status: DC | PRN
  Administered 2016-12-12 (×2): 100 ug via INTRAVENOUS

## 2016-12-12 MED ORDER — ZOLPIDEM TARTRATE 5 MG PO TABS: 5.0000 mg | ORAL_TABLET | Freq: Every evening | ORAL | Status: DC | PRN

## 2016-12-12 MED ORDER — OXYCODONE-ACETAMINOPHEN 5-325 MG PO TABS: 1.0000 | ORAL_TABLET | ORAL | Status: DC | PRN

## 2016-12-12 MED ORDER — ALBUTEROL SULFATE HFA 108 (90 BASE) MCG/ACT IN AERS: 1.0000 | INHALATION_SPRAY | Freq: Four times a day (QID) | RESPIRATORY_TRACT | Status: DC | PRN

## 2016-12-12 MED ORDER — LISINOPRIL 20 MG PO TABS: 40.0000 mg | ORAL_TABLET | Freq: Every day | ORAL | Status: DC

## 2016-12-12 MED ORDER — VITAMIN B-12 1000 MCG PO TABS
1000.0000 ug | ORAL_TABLET | Freq: Every day | ORAL | Status: DC
  Administered 2016-12-13 – 2016-12-14 (×2): 1000 ug via ORAL
  Filled 2016-12-12 (×2): qty 1

## 2016-12-12 MED ORDER — SIMVASTATIN 20 MG PO TABS
40.0000 mg | ORAL_TABLET | Freq: Every day | ORAL | Status: DC
  Administered 2016-12-13: 40 mg via ORAL
  Filled 2016-12-12 (×2): qty 2

## 2016-12-12 MED ORDER — ASPIRIN EC 81 MG PO TBEC
81.0000 mg | DELAYED_RELEASE_TABLET | Freq: Every day | ORAL | Status: DC
  Administered 2016-12-13 – 2016-12-14 (×2): 81 mg via ORAL
  Filled 2016-12-12 (×2): qty 1

## 2016-12-12 MED ORDER — DEXAMETHASONE SODIUM PHOSPHATE 10 MG/ML IJ SOLN
INTRAMUSCULAR | Status: DC | PRN
  Administered 2016-12-12: 10 mg via INTRAVENOUS

## 2016-12-12 MED ORDER — GABAPENTIN 300 MG PO CAPS
600.0000 mg | ORAL_CAPSULE | Freq: Three times a day (TID) | ORAL | Status: DC
Start: 2016-12-12 — End: 2016-12-14
  Administered 2016-12-12 – 2016-12-14 (×5): 600 mg via ORAL
  Filled 2016-12-12 (×6): qty 2

## 2016-12-12 MED ORDER — MAGNESIUM CITRATE PO SOLN
1.0000 | Freq: Once | ORAL | Status: DC | PRN
  Filled 2016-12-12: qty 296

## 2016-12-12 MED ORDER — MAGNESIUM HYDROXIDE 400 MG/5ML PO SUSP
30.0000 mL | Freq: Every day | ORAL | Status: DC | PRN
  Filled 2016-12-12: qty 30

## 2016-12-12 MED ORDER — CEFAZOLIN SODIUM-DEXTROSE 2-4 GM/100ML-% IV SOLN
2.0000 g | Freq: Four times a day (QID) | INTRAVENOUS | Status: AC
  Administered 2016-12-12 – 2016-12-13 (×3): 2 g via INTRAVENOUS
  Filled 2016-12-12 (×3): qty 100

## 2016-12-12 MED ORDER — HYDROCHLOROTHIAZIDE 25 MG PO TABS: 25.0000 mg | ORAL_TABLET | Freq: Every day | ORAL | Status: DC

## 2016-12-12 MED ORDER — PHENOL 1.4 % MT LIQD
1.0000 | OROMUCOSAL | Status: DC | PRN
  Filled 2016-12-12: qty 177

## 2016-12-12 MED ORDER — OXYCODONE HCL 5 MG PO TABS
5.0000 mg | ORAL_TABLET | ORAL | Status: DC | PRN
  Administered 2016-12-12: 5 mg via ORAL
  Filled 2016-12-12: qty 1

## 2016-12-12 MED ORDER — FENTANYL CITRATE (PF) 100 MCG/2ML IJ SOLN
25.0000 ug | INTRAMUSCULAR | Status: DC | PRN
  Administered 2016-12-12: 25 ug via INTRAVENOUS

## 2016-12-12 MED ORDER — ROCURONIUM BROMIDE 50 MG/5ML IV SOLN
INTRAVENOUS | Status: AC
  Filled 2016-12-12: qty 1

## 2016-12-12 MED ORDER — NEOMYCIN-POLYMYXIN B GU 40-200000 IR SOLN
Status: DC | PRN
  Administered 2016-12-12: 4 mL

## 2016-12-12 MED ORDER — CEFAZOLIN SODIUM-DEXTROSE 2-4 GM/100ML-% IV SOLN
INTRAVENOUS | Status: AC
  Filled 2016-12-12: qty 100

## 2016-12-12 MED ORDER — SUCCINYLCHOLINE CHLORIDE 20 MG/ML IJ SOLN
INTRAMUSCULAR | Status: AC
  Filled 2016-12-12: qty 1

## 2016-12-12 SURGICAL SUPPLY — 51 items
BLADE SAW SAG 18.5X105 (BLADE) ×2 IMPLANT
BNDG COHESIVE 6X5 TAN STRL LF (GAUZE/BANDAGES/DRESSINGS) ×6 IMPLANT
CANISTER SUCT 1200ML W/VALVE (MISCELLANEOUS) ×2 IMPLANT
CAPT HIP TOTAL HALF DEPUY (Capitated) ×2 IMPLANT
CAPT HIP TOTAL HALF MEDACTA (Capitated) ×2 IMPLANT
CATH FOL LEG HOLDER (MISCELLANEOUS) ×2 IMPLANT
CATH TRAY METER 16FR LF (MISCELLANEOUS) ×2 IMPLANT
CHLORAPREP W/TINT 26ML (MISCELLANEOUS) ×2 IMPLANT
DRAPE C-ARM XRAY 36X54 (DRAPES) ×2 IMPLANT
DRAPE INCISE IOBAN 66X60 STRL (DRAPES) IMPLANT
DRAPE POUCH INSTRU U-SHP 10X18 (DRAPES) ×2 IMPLANT
DRAPE SHEET LG 3/4 BI-LAMINATE (DRAPES) ×6 IMPLANT
DRAPE TABLE BACK 80X90 (DRAPES) ×2 IMPLANT
DRESSING SURGICEL FIBRLLR 1X2 (HEMOSTASIS) ×2 IMPLANT
DRSG OPSITE POSTOP 4X8 (GAUZE/BANDAGES/DRESSINGS) ×4 IMPLANT
DRSG SURGICEL FIBRILLAR 1X2 (HEMOSTASIS) ×4
ELECT BLADE 6.5 EXT (BLADE) ×2 IMPLANT
ELECT REM PT RETURN 9FT ADLT (ELECTROSURGICAL) ×2
ELECTRODE REM PT RTRN 9FT ADLT (ELECTROSURGICAL) ×1 IMPLANT
EVACUATOR 1/8 PVC DRAIN (DRAIN) IMPLANT
GLOVE BIOGEL PI IND STRL 9 (GLOVE) ×1 IMPLANT
GLOVE BIOGEL PI INDICATOR 9 (GLOVE) ×1
GLOVE SURG SYN 9.0  PF PI (GLOVE) ×2
GLOVE SURG SYN 9.0 PF PI (GLOVE) ×2 IMPLANT
GOWN SRG 2XL LVL 4 RGLN SLV (GOWNS) ×1 IMPLANT
GOWN STRL NON-REIN 2XL LVL4 (GOWNS) ×1
GOWN STRL REUS W/ TWL LRG LVL3 (GOWN DISPOSABLE) ×1 IMPLANT
GOWN STRL REUS W/TWL LRG LVL3 (GOWN DISPOSABLE) ×1
HOOD PEEL AWAY FLYTE STAYCOOL (MISCELLANEOUS) ×2 IMPLANT
KIT PREVENA INCISION MGT 13 (CANNISTER) ×2 IMPLANT
MAT BLUE FLOOR 46X72 FLO (MISCELLANEOUS) ×2 IMPLANT
NDL SAFETY 18GX1.5 (NEEDLE) ×2 IMPLANT
NEEDLE SPNL 18GX3.5 QUINCKE PK (NEEDLE) ×2 IMPLANT
NS IRRIG 1000ML POUR BTL (IV SOLUTION) ×2 IMPLANT
PACK HIP COMPR (MISCELLANEOUS) ×2 IMPLANT
SOL PREP PVP 2OZ (MISCELLANEOUS) ×2
SOLUTION PREP PVP 2OZ (MISCELLANEOUS) ×1 IMPLANT
SPONGE DRAIN TRACH 4X4 STRL 2S (GAUZE/BANDAGES/DRESSINGS) ×2 IMPLANT
STAPLER SKIN PROX 35W (STAPLE) ×2 IMPLANT
STRAP SAFETY BODY (MISCELLANEOUS) ×2 IMPLANT
SUT DVC 2 QUILL PDO  T11 36X36 (SUTURE) ×1
SUT DVC 2 QUILL PDO T11 36X36 (SUTURE) ×1 IMPLANT
SUT SILK 0 (SUTURE) ×1
SUT SILK 0 30XBRD TIE 6 (SUTURE) ×1 IMPLANT
SUT V-LOC 90 ABS DVC 3-0 CL (SUTURE) ×2 IMPLANT
SUT VIC AB 1 CT1 36 (SUTURE) ×2 IMPLANT
SYR 20CC LL (SYRINGE) ×2 IMPLANT
SYR 30ML LL (SYRINGE) ×2 IMPLANT
TAPE MICROFOAM 4IN (TAPE) ×2 IMPLANT
TOWEL OR 17X26 4PK STRL BLUE (TOWEL DISPOSABLE) ×2 IMPLANT
WND VAC CANISTER 500ML (MISCELLANEOUS) ×2 IMPLANT

## 2016-12-12 NOTE — Care Management Note (Signed)
Case Management Note  Patient Details  Name: Sierra NajjarRena M Guzman MRN: [id number] Date of Birth: [birth year]  Subjective/Objective: POD # 0 THA right hip. RNCM consult for discharge planning. PT pending.                   Action/Plan: Following progression  Expected Discharge Date:                  Expected Discharge Plan:     In-House Referral:     Discharge planning Services  CM Consult  Post Acute Care Choice:    Choice offered to:     DME Arranged:    DME Agency:     HH Arranged:    HH Agency:     Status of Service:  In process, will continue to follow  If discussed at Long Length of Stay Meetings, dates discussed:    Additional Comments:  Marily MemosLisa M Atul Delucia, RN 12/12/2016, 11:09 AM

## 2016-12-12 NOTE — Op Note (Signed)
12/12/2016  9:15 AM  PATIENT:  Sierra Guzman  76 y.o. female  PRE-OPERATIVE DIAGNOSIS:  primary localized osteoarthritis right hip  POST-OPERATIVE DIAGNOSIS:  primary localized osteoarthritis right hip  PROCEDURE:  Procedure(s): TOTAL HIP ARTHROPLASTY ANTERIOR APPROACH (Right)  SURGEON: Leitha SchullerMichael J Aundre Hietala, MD  ASSISTANTS: none  ANESTHESIA:   general  EBL:  Total I/O In: -  Out: 400 [Urine:200; Blood:200]  BLOOD ADMINISTERED:none  DRAINS: none   LOCAL MEDICATIONS USED:  MARCAINE     SPECIMEN:  Source of Specimen:  right femoral head   DISPOSITION OF SPECIMEN:  PATHOLOGY  COUNTS:  YES  TOURNIQUET:  * No tourniquets in log *  IMPLANTS: Medacta 52 DM Mpact cup and liner, DePuy Actis 7 atandard stem with 1.5 mm 28 mm metal head  DICTATION: .Dragon Dictation   The patient was brought to the operating room and after spinal anesthesia was obtained patient was placed on the operative table with the ipsilateral foot into the Medacta attachment, contralateral leg on a well-padded table. C-arm was brought in and preop template x-ray taken. After prepping and draping in usual sterile fashion appropriate patient identification and timeout procedures were completed. Anterior approach to the hip was obtained and centered over the greater trochanter and TFL muscle. The subcutaneous tissue was incised hemostasis being achieved by electrocautery. TFL fascia was incised and the muscle retracted laterally deep retractor placed. The lateral femoral circumflex vessels were identified and ligated. The anterior capsule was exposed and a capsulotomy performed. The neck was identified and a femoral neck cut carried out with a saw. The head was removed without difficulty and showed sclerotic femoral head and acetabulum. Reaming was carried out to 52 mm and a 52 mm cup trial gave appropriate tightness to the acetabular component a 52 cup was impacted into position. The leg was then externally rotated and  ischiofemoral and pubofemoral releases carried out. The femur was sequentially broached to a size 7, size 7 standard with +1.5  trials were placed and the final components chosen. The 7 standard stem was inserted along with a +1.5  28 mm head and 52 mm liner. The hip was reduced and was stable the wound was thoroughly irrigated with fibrilar placed along posterior capsule incision. The deep fascia was closed using a heavy Quill after infiltration of 30 cc of quarter percent Sensorcaine with epinephrine. 2-0 vicryl to close the skin with skin staples and an incisional wound vac applied  PLAN OF CARE: Admit to inpatient

## 2016-12-12 NOTE — Clinical Social Work Placement (Signed)
   CLINICAL SOCIAL WORK PLACEMENT  NOTE  Date:  12/12/2016  Patient Details  Name: Otho NajjarRena M Moure MRN: [id number] Date of Birth: June 06, 1940  Clinical Social Work is seeking post-discharge placement for this patient at the Skilled  Nursing Facility level of care (*CSW will initial, date and re-position this form in  chart as items are completed):  Yes   Patient/family provided with [place] Clinical Social Work Department's list of facilities offering this level of care within the geographic area requested by the patient (or if unable, by the patient's family).  Yes   Patient/family informed of their freedom to choose among providers that offer the needed level of care, that participate in Medicare, Medicaid or managed care program needed by the patient, have an available bed and are willing to accept the patient.  Yes   Patient/family informed of [facility] ownership interest in [facility] and [facility], as well as of the fact that they are under no obligation to receive care at these facilities.  PASRR submitted to EDS on       PASRR number received on       Existing PASRR number confirmed on 12/12/16     FL2 transmitted to all facilities in geographic area requested by pt/family on 12/12/16     FL2 transmitted to all facilities within larger geographic area on       Patient informed that his/her managed care company has contracts with or will negotiate with certain facilities, including the following:        Yes   Patient/family informed of bed offers received.  Patient chooses bed at  [facility]     Physician recommends and patient chooses bed at      Patient to be transferred to   on  .  Patient to be transferred to facility by       Patient family notified on   of transfer.  Name of family member notified:        PHYSICIAN       Additional Comment:    _______________________________________________ Ramsay Bognar, Darleen CrockerBailey M, LCSW 12/12/2016, 2:42  PM

## 2016-12-12 NOTE — Progress Notes (Signed)
Pt nauseous with  400 ml emesis. MD Rosita KeaMenz notified, MD ordering scopolamine patch.

## 2016-12-12 NOTE — Clinical Social Work Note (Signed)
Clinical Social Work Assessment  Patient Details  Name: Sierra Guzman MRN: [id number] Date of Birth: 07-25-1940  Date of referral:  12/12/16               Reason for consult:  Facility Placement                Permission sought to share information with:  Chartered certified accountant granted to share information::  Yes, Verbal Permission Granted  Name::      [place]::   [place]   Relationship::     Contact Information:     Housing/Transportation Living arrangements for the past 2 months:  [place] of Information:  Patient Patient Interpreter Needed:  None Criminal Activity/Legal Involvement Pertinent to Current Situation/Hospitalization:  No - Comment as needed Significant Relationships:  Adult Children, Other Family Members Lives with:  Self, Adult Children Do you feel safe going back to the place where you live?  Yes Need for family participation in patient care:  Yes (Comment)  Care giving concerns:  Patient lives in [place], [place] and usually stays alone however her daughter Sierra Guzman is temporally staying with her.    Social Worker assessment / plan:  Holiday representative (CSW) received SNF consult. PT is recommending SNF. CSW met with patient and her sister in law Sierra Guzman was at bedside. Patient was alert and oriented X4 and was sitting up in the chair at bedside. CSW introduced self and explained role of CSW department. Patient reported that she lives alone however her daughter is staying with her for a little while. CSW explained SNF process and that Mcarthur Rossetti will have to approve SNF. Patient is agreeable to SNF search in [facility] and prefers [facility]. FL2 complete and faxed out.   CSW presented bed offers to patient and she chose [facility]. Patient is okay with a semi-private room at [facility]. CSW will start [place] authorization through [facility] tomorrow on post op day 1. [facility] admissions  coordinator at [facility] is aware of above. CSW will continue to follow and assist as needed.    Employment status:  Retired Nurse, adult PT Recommendations:  [place] / Referral to community resources:  [place]  Patient/Family's Response to care:  Patient is agreeable to go to [facility] for short term rehab.   Patient/Family's Understanding of and Emotional Response to Diagnosis, Current Treatment, and Prognosis:  Patient was very pleasant and thanked CSW for assistance.   Emotional Assessment Appearance:  Appears stated age Attitude/Demeanor/Rapport:    Affect (typically observed):  Accepting, Adaptable, Pleasant Orientation:  Oriented to Self, Oriented to Place, Oriented to  Time, Oriented to Situation Alcohol / Substance use:  Not Applicable Psych involvement (Current and /or in the community):  No (Comment)  Discharge Needs  Concerns to be addressed:  Discharge Planning Concerns Readmission within the last 30 days:  No Current discharge risk:  Dependent with Mobility Barriers to Discharge:  Continued Medical Work up   [employer], Veronia Beets, LCSW 12/12/2016, 2:43 PM

## 2016-12-12 NOTE — Anesthesia Preprocedure Evaluation (Addendum)
Anesthesia Evaluation  Patient identified by MRN, date of birth, ID band Patient awake    Reviewed: Allergy & Precautions, NPO status , Patient's Chart, lab work & pertinent test results  Airway Mallampati: II  TM Distance: >3 FB     Dental  (+) Upper Dentures, Lower Dentures   Pulmonary asthma , former smoker,    Pulmonary exam normal        Cardiovascular hypertension, Pt. on medications + CAD  Normal cardiovascular exam+ Valvular Problems/Murmurs      Neuro/Psych Anxiety    GI/Hepatic GERD  ,  Endo/Other  diabetes, Well Controlled, Type 2, Oral Hypoglycemic Agents  Renal/GU Renal disease  negative genitourinary   Musculoskeletal  (+) Arthritis , Osteoarthritis,    Abdominal Normal abdominal exam  (+)   Peds negative pediatric ROS (+)  Hematology  (+) anemia ,   Anesthesia Other Findings Past Medical History: No date: Anemia No date: Anxiety No date: Arthritis No date: Asthma No date: Chronic kidney disease     Comment:  Chronic Kidney Disease---Stage III No date: Coronary artery disease No date: DDD (degenerative disc disease), lumbar No date: Diabetes mellitus without complication (HCC) No date: GERD (gastroesophageal reflux disease) No date: Gout No date: Heart murmur No date: Hyperlipidemia No date: Hypertension No date: Osteoarthritis No date: Vitamin B 12 deficiency  Reproductive/Obstetrics                            Anesthesia Physical Anesthesia Plan  ASA: III  Anesthesia Plan: General   Post-op Pain Management:    Induction:   PONV Risk Score and Plan:   Airway Management Planned: Oral ETT  Additional Equipment:   Intra-op Plan:   Post-operative Plan: Extubation in OR  Informed Consent: I have reviewed the patients History and Physical, chart, labs and discussed the procedure including the risks, benefits and alternatives for the proposed anesthesia  with the patient or authorized representative who has indicated his/her understanding and acceptance.   Dental advisory given  Plan Discussed with: CRNA and Surgeon  Anesthesia Plan Comments:         Anesthesia Quick Evaluation

## 2016-12-12 NOTE — Evaluation (Signed)
Physical Therapy Evaluation Patient Details Name: Sierra Guzman MRN: [id number] DOB: 1940/05/13 Today's Date: 12/12/2016   History of Present Illness  Pt underwent R THR anterior approach with post-op nausea. She is POD#0 at time of PT evaluation. PMH includes DMII, CKD, anxiety, hyperlipidemia, GERD, HTN, bilateral TKR, L THR, and CAD.   Clinical Impression  Pt admitted with above diagnosis. Pt currently with functional limitations due to the deficits listed below (see PT Problem List).  Pt requiring minA+1 for bed mobility and transfers. She is able to take short, shuffling steps to transfer from bed to recliner. Pt moves very slow and demonstrates decreased weight shifting to RLE. Immediately following ambulation pt becomes very nauseated and proceeds to vomit approximately of fluid. RN notified of vomiting and pt requesting benedryl due to feeling "itchy." Pt demonstrates RLE weakness and limited mobility on this date. Current recommendation is for SNF placement. Will continue to follow and update DC recommendations as appropriate.  Pt will benefit from PT services to address deficits in strength, balance, and mobility in order to return to full function at home.      Follow Up Recommendations SNF    Equipment Recommendations  Rolling walker with 5" wheels    Recommendations for Other Services OT consult     Precautions / Restrictions Precautions Precautions: Anterior Hip Precaution Booklet Issued: Yes (comment) Restrictions Weight Bearing Restrictions: Yes RLE Weight Bearing: Weight bearing as tolerated      Mobility  Bed Mobility Overal bed mobility: Needs Assistance Bed Mobility: Supine to Sit     Supine to sit: Min assist     General bed mobility comments: Prior to bed mobility pt placed on room air. SaO2 drops to 85% and doesn't recover until supplemental O2 reapplied. Pt requiring minA+1 for RLE management when moving toward EOB. HOB elevated and bed rails utilized.  Good UE strength noted. Once upright at EOB pt reports feeling dizzy. Vitals obtained and are WNL.  Transfers Overall transfer level: Needs assistance Equipment used: Rolling walker (2 wheeled) Transfers: Sit to/from Stand Sit to Stand: Min assist         General transfer comment: Pt moves slowly and requires minA+1 due to poor RLE strength and decreased weight acceptance. Once upright she is steady and stable with UE support on rolling walker  Ambulation/Gait Ambulation/Gait assistance: Min guard Ambulation Distance (Feet): 3 Feet Assistive device: Rolling walker (2 wheeled) Gait Pattern/deviations: Decreased weight shift to right;Decreased stance time - right;Decreased step length - left;Step-to pattern Gait velocity: Decreased Gait velocity interpretation: <1.8 ft/sec, indicative of risk for recurrent falls General Gait Details: Pt able to take short, shuffling steps to transfer from bed to recliner. Pt moves very slow and demonstrates decreased weight shifting to RLE. Immediately following ambulation pt becomes very nauseated and proceeds to vomit approximately of fluid. RN notified of vomiting and pt requesting benedryl due to feeling "itchy." RN arrives and administers medication  Stairs            Wheelchair Mobility    Modified Rankin (Stroke Patients Only)       Balance Overall balance assessment: Needs assistance Sitting-balance support: No upper extremity supported Sitting balance-Leahy Scale: Good     Standing balance support: Bilateral upper extremity supported Standing balance-Leahy Scale: Fair Standing balance comment: Pt able to remain in standing with bilateral UE support on rolling walker  Pertinent Vitals/Pain Pain Assessment: 0-10 Pain Score: 9  Pain Location: R hip Pain Descriptors / Indicators: Operative site guarding Pain Intervention(s): Monitored during session;Premedicated before session     Home Living Family/patient expects to be discharged to:: Skilled nursing facility Living Arrangements: Children Available Help at Discharge: Family Type of Home: House Home Access: Ramped entrance     Home Layout: One level;Other (Comment) ("a few steps down into laundry room.") Home Equipment: Cane - single point;Shower seat;Grab bars - tub/shower      Prior Function Level of Independence: Needs assistance   Gait / Transfers Assistance Needed: Independent for ambulation with prn use of spc. No falls  ADL's / Homemaking Assistance Needed: Independent with ADLs, assist for IADLs from family        Hand Dominance   Dominant Hand: Right    Extremity/Trunk Assessment   Upper Extremity Assessment Upper Extremity Assessment: Overall WFL for tasks assessed    Lower Extremity Assessment Lower Extremity Assessment: RLE deficits/detail RLE Deficits / Details: Requires 2 finger assist for RLE SLR. Reports intact sensation to light touch. Full DF/PF.        Communication   Communication: No difficulties  Cognition Arousal/Alertness: Lethargic Behavior During Therapy: WFL for tasks assessed/performed Overall Cognitive Status: Within Functional Limits for tasks assessed                                        General Comments      Exercises Total Joint Exercises Ankle Circles/Pumps: AROM;Both;10 reps;Supine Quad Sets: Strengthening;Both;10 reps;Supine Gluteal Sets: Strengthening;Both;10 reps;Supine Towel Squeeze: Strengthening;Both;10 reps;Supine Short Arc Quad: Strengthening;Right;10 reps;Supine Heel Slides: Strengthening;Right;10 reps;Supine Hip ABduction/ADduction: Strengthening;Right;10 reps;Supine Straight Leg Raises: Strengthening;Right;10 reps;Supine   Assessment/Plan    PT Assessment Patient needs continued PT services  PT Problem List Decreased strength;Decreased activity tolerance;Decreased balance;Decreased mobility;Pain       PT  Treatment Interventions DME instruction;Gait training;Stair training;Functional mobility training;Therapeutic activities;Therapeutic exercise;Balance training;Neuromuscular re-education;Patient/family education;Manual techniques    PT Goals (Current goals can be found in the Care Plan section)  Acute Rehab PT Goals Patient Stated Goal: "I need to go to rehab." Pt eager to return to prior level of function PT Goal Formulation: With patient/family Time For Goal Achievement: 12/26/16 Potential to Achieve Goals: Good    Frequency BID   Barriers to discharge        Co-evaluation               AM-PAC PT "6 Clicks" Daily Activity  Outcome Measure Difficulty turning over in bed (including adjusting bedclothes, sheets and blankets)?: Unable Difficulty moving from lying on back to sitting on the side of the bed? : Unable Difficulty sitting down on and standing up from a chair with arms (e.g., wheelchair, bedside commode, etc,.)?: Unable Help needed moving to and from a bed to chair (including a wheelchair)?: A Little Help needed walking in hospital room?: A Little Help needed climbing 3-5 steps with a railing? : A Lot 6 Click Score: 11    End of Session Equipment Utilized During Treatment: Gait belt;Oxygen Activity Tolerance: Patient tolerated treatment well Patient left: with call bell/phone within reach;in chair;with chair alarm set;with family/visitor present Nurse Communication: Mobility status;Other (comment) (nausea and vomiting, feeling "itchy") PT Visit Diagnosis: Unsteadiness on feet (R26.81);Muscle weakness (generalized) (M62.81);Pain Pain - Right/Left: Right Pain - part of body: Hip    Time: [id number] PT Time Calculation (min) (ACUTE  ONLY): 38 min   Charges:   PT Evaluation $PT Eval Low Complexity: 1 Low PT Treatments $Therapeutic Exercise: 8-22 mins   PT G Codes:   PT G-Codes **NOT FOR INPATIENT CLASS** Functional Assessment Tool Used: AM-PAC 6 Clicks Basic  Mobility Functional Limitation: Mobility: Walking and moving around Mobility: Walking and Moving Around Current Status (W2956): At least 60 percent but less than 80 percent impaired, limited or restricted Mobility: Walking and Moving Around Goal Status [phone]): At least 20 percent but less than 40 percent impaired, limited or restricted    Lynnea Maizes PT, DPT    Huprich,Jason 12/12/2016, 2:38 PM

## 2016-12-12 NOTE — Anesthesia Post-op Follow-up Note (Signed)
Anesthesia QCDR form completed.        

## 2016-12-12 NOTE — Transfer of Care (Signed)
Immediate Anesthesia Transfer of Care Note  Patient: Sierra NajjarRena M Rosero  Procedure(s) Performed: Procedure(s): TOTAL HIP ARTHROPLASTY ANTERIOR APPROACH (Right)  Patient Location: PACU  Anesthesia Type:General  Level of Consciousness: awake, oriented and sedated  Airway & Oxygen Therapy: Patient Spontanous Breathing and Patient connected to face mask oxygen  Post-op Assessment: Report given to RN and Post -op Vital signs reviewed and stable  Post vital signs: Reviewed and stable  Last Vitals:  Vitals:   12/12/16 0618  BP: (!) 178/69  Pulse: 72  Temp: (!) 36.4 C  SpO2: 98%    Last Pain:  Vitals:   12/12/16 0618  TempSrc: Oral         Complications: No apparent anesthesia complications

## 2016-12-12 NOTE — Anesthesia Procedure Notes (Signed)
Procedure Name: Intubation Date/Time: 12/12/2016 8:49 AM Performed by: Nelda Marseille Pre-anesthesia Checklist: Patient identified, Patient being monitored, Timeout performed, Emergency Drugs available and Suction available Patient Re-evaluated:Patient Re-evaluated prior to induction Oxygen Delivery Method: Circle system utilized Preoxygenation: Pre-oxygenation with 100% oxygen Induction Type: IV induction Ventilation: Mask ventilation without difficulty Laryngoscope Size: Mac and 3 Grade View: Grade I Tube type: Oral Tube size: 7.0 mm Number of attempts: 1 Airway Equipment and Method: Stylet Placement Confirmation: ETT inserted through vocal cords under direct vision,  positive ETCO2 and breath sounds checked- equal and bilateral Secured at: 21 cm Tube secured with: Tape Dental Injury: Teeth and Oropharynx as per pre-operative assessment

## 2016-12-12 NOTE — Progress Notes (Signed)
Pt stating oxycodone makes her sick. Pt requesting Percocet. MD Rosita KeaMenz notified. Orders received for Percocet 5-325 mg. Order placed.

## 2016-12-12 NOTE — [place] FL2 (Signed)
[place] MEDICAID FL2 LEVEL OF CARE SCREENING TOOL     IDENTIFICATION  Patient Name: Sierra NajjarRena M Guzman Birthdate: 07-May-1940 Sex: female Admission Date (Current Location): 12/12/2016  [place]   Facility and Address:  [facility], [address], [place], [place] [address]      Provider Number: [id number]  Attending Physician Name and Address:  Kennedy BuckerMenz, Michael, MD  Relative Name and Phone Number:       Current Level of Care: Hospital Recommended Level of Care: Skilled Nursing Facility Prior Approval Number:    Date Approved/Denied:   PASRR Number:  [id number] )  Discharge Plan: SNF    Current Diagnoses: Patient Active Problem List   Diagnosis Date Noted  . Primary localized osteoarthritis of right hip 12/12/2016    Orientation RESPIRATION BLADDER Height & Weight     Self, Time, Situation, Place  O2 (2.5 Liters ) Continent Weight:   Height:     BEHAVIORAL SYMPTOMS/MOOD NEUROLOGICAL BOWEL NUTRITION STATUS   (none)  (none) Continent Diet (Diet: Clear Liquid to be Advanced. )  AMBULATORY STATUS COMMUNICATION OF NEEDS Skin   Extensive Assist Verbally Surgical wounds, Wound Vac (Incision: Right Hip. Provena wound vac. )                       Personal Care Assistance Level of Assistance  Bathing, Feeding, Dressing Bathing Assistance: Limited assistance Feeding assistance: Independent Dressing Assistance: Limited assistance     Functional Limitations Info  Sight, Hearing, Speech Sight Info: Adequate Hearing Info: Adequate Speech Info: Adequate    SPECIAL CARE FACTORS FREQUENCY  PT (By licensed PT), OT (By licensed OT)     PT Frequency:  (5) OT Frequency:  (5)            Contractures      Additional Factors Info  Code Status, Allergies Code Status Info:  (Full Code. ) Allergies Info:  (Morphine and Related )           Current Medications (12/12/2016):  This is the current hospital active  medication list Current Facility-Administered Medications  Medication Dose Route Frequency Provider Last Rate Last Dose  . 0.9 %  sodium chloride infusion   Intravenous Continuous Kennedy BuckerMenz, Michael, MD      . acetaminophen (TYLENOL) tablet 650 mg  650 mg Oral Q6H PRN Kennedy BuckerMenz, Michael, MD       Or  . acetaminophen (TYLENOL) suppository 650 mg  650 mg Rectal Q6H PRN Kennedy BuckerMenz, Michael, MD      . albuterol (PROVENTIL) (2.5 MG/3ML) 0.083% nebulizer solution 2.5 mg  2.5 mg Nebulization Q6H PRN Kennedy BuckerMenz, Michael, MD      . allopurinol (ZYLOPRIM) tablet 300 mg  300 mg Oral Daily Kennedy BuckerMenz, Michael, MD      . alum & mag hydroxide-simeth (MAALOX/MYLANTA) 200-200-20 MG/5ML suspension 30 mL  30 mL Oral Q4H PRN Kennedy BuckerMenz, Michael, MD      . aspirin EC tablet 81 mg  81 mg Oral Daily Kennedy BuckerMenz, Michael, MD      . bisacodyl (DULCOLAX) suppository 10 mg  10 mg Rectal Daily PRN Kennedy BuckerMenz, Michael, MD      . ceFAZolin (ANCEF) IVPB 2g/100 mL premix  2 g Intravenous Q6H Kennedy BuckerMenz, Michael, MD      . cholecalciferol (VITAMIN D) tablet 1,000 Units  1,000 Units Oral Daily Kennedy BuckerMenz, Michael, MD      . diphenhydrAMINE (BENADRYL) 12.5 MG/5ML elixir 12.5-25 mg  12.5-25 mg Oral Q4H PRN  Kennedy Bucker, MD      . docusate sodium (COLACE) capsule 100 mg  100 mg Oral BID Kennedy Bucker, MD      . Melene Muller ON 12/13/2016] enoxaparin (LOVENOX) injection 40 mg  40 mg Subcutaneous Q24H Kennedy Bucker, MD      . Melene Muller ON 12/13/2016] famotidine (PEPCID) tablet 20 mg  20 mg Oral Daily Kennedy Bucker, MD      . fentaNYL (SUBLIMAZE) 100 MCG/2ML injection           . fluticasone furoate-vilanterol (BREO ELLIPTA) 200-25 MCG/INH 1 puff  1 puff Inhalation Daily Kennedy Bucker, MD      . gabapentin (NEURONTIN) capsule 600 mg  600 mg Oral TID Kennedy Bucker, MD      . hydrochlorothiazide (HYDRODIURIL) tablet 25 mg  25 mg Oral Daily Kennedy Bucker, MD      . HYDROmorphone (DILAUDID) injection 0.5 mg  0.5 mg Intravenous Q2H PRN Kennedy Bucker, MD      . insulin aspart (novoLOG) injection 0-15 Units   0-15 Units Subcutaneous TID WC Kennedy Bucker, MD      . Melene Muller ON 12/13/2016] lisinopril (PRINIVIL,ZESTRIL) tablet 40 mg  40 mg Oral Daily Kennedy Bucker, MD      . magnesium citrate solution 1 Bottle  1 Bottle Oral Once PRN Kennedy Bucker, MD      . magnesium hydroxide (MILK OF MAGNESIA) suspension 30 mL  30 mL Oral Daily PRN Kennedy Bucker, MD      . menthol-cetylpyridinium (CEPACOL) lozenge 3 mg  1 lozenge Oral PRN Kennedy Bucker, MD       Or  . phenol (CHLORASEPTIC) mouth spray 1 spray  1 spray Mouth/Throat PRN Kennedy Bucker, MD      . metFORMIN (GLUCOPHAGE) tablet 1,000 mg  1,000 mg Oral Q supper Kennedy Bucker, MD      . methocarbamol (ROBAXIN) tablet 500 mg  500 mg Oral Q6H PRN Kennedy Bucker, MD       Or  . methocarbamol (ROBAXIN) 500 mg in dextrose 5 % 50 mL IVPB  500 mg Intravenous Q6H PRN Kennedy Bucker, MD      . metoCLOPramide (REGLAN) tablet 5-10 mg  5-10 mg Oral Q8H PRN Kennedy Bucker, MD       Or  . metoCLOPramide (REGLAN) injection 5-10 mg  5-10 mg Intravenous Q8H PRN Kennedy Bucker, MD      . Melene Muller ON 12/13/2016] metoprolol succinate (TOPROL-XL) 24 hr tablet 25 mg  25 mg Oral Daily Kennedy Bucker, MD      . omega-3 acid ethyl esters (LOVAZA) capsule 1 g  1 g Oral Daily Kennedy Bucker, MD      . ondansetron [facility]) tablet 4 mg  4 mg Oral Q6H PRN Kennedy Bucker, MD       Or  . ondansetron [facility]) injection 4 mg  4 mg Intravenous Q6H PRN Kennedy Bucker, MD      . oxyCODONE (Oxy IR/ROXICODONE) immediate release tablet 5-10 mg  5-10 mg Oral Q3H PRN Kennedy Bucker, MD      . simvastatin (ZOCOR) tablet 40 mg  40 mg Oral q1800 Kennedy Bucker, MD      . vitamin B-12 (CYANOCOBALAMIN) tablet 1,000 mcg  1,000 mcg Oral Daily Kennedy Bucker, MD      . zolpidem (AMBIEN) tablet 5 mg  5 mg Oral QHS PRN Kennedy Bucker, MD         Discharge Medications: Please see discharge summary for a list of discharge medications.  Relevant Imaging Results:  Relevant Lab Results:   Additional Information  (SSN:  [id number])  Joziyah Roblero, Darleen Crocker, LCSW

## 2016-12-12 NOTE — H&P (Signed)
Reviewed paper H+P, will be scanned into chart. No changes noted.  

## 2016-12-12 NOTE — Anesthesia Postprocedure Evaluation (Signed)
Anesthesia Post Note  Patient: Sierra NajjarRena M Guzman  Procedure(s) Performed: Procedure(s) (LRB): TOTAL HIP ARTHROPLASTY ANTERIOR APPROACH (Right)  Patient location during evaluation: PACU Anesthesia Type: General Level of consciousness: awake and alert and oriented Pain management: pain level controlled Vital Signs Assessment: post-procedure vital signs reviewed and stable Respiratory status: spontaneous breathing Cardiovascular status: blood pressure returned to baseline Anesthetic complications: no     Last Vitals:  Vitals:   12/12/16 1350 12/12/16 1442  BP: 131/73 (!) 114/56  Pulse: 69 67  Resp:  18  Temp:  36.4 C  SpO2: 97%     Last Pain:  Vitals:   12/12/16 1442  TempSrc: Oral  PainSc:                  Sierra Guzman

## 2016-12-13 ENCOUNTER — Encounter: Payer: Self-pay | Admitting: Orthopedic Surgery

## 2016-12-13 LAB — CBC
HCT: 28.1 % — ABNORMAL LOW (ref 35.0–47.0)
HEMOGLOBIN: 9.4 g/dL — AB (ref 12.0–16.0)
MCH: 33.1 pg (ref 26.0–34.0)
MCHC: 33.6 g/dL (ref 32.0–36.0)
MCV: 98.6 fL (ref 80.0–100.0)
PLATELETS: 217 10*3/uL (ref 150–440)
RBC: 2.85 MIL/uL — AB (ref 3.80–5.20)
RDW: 13.5 % (ref 11.5–14.5)
WBC: 10.1 10*3/uL (ref 3.6–11.0)

## 2016-12-13 LAB — BASIC METABOLIC PANEL
Anion gap: 7 (ref 5–15)
BUN: 21 mg/dL — ABNORMAL HIGH (ref 6–20)
CHLORIDE: 108 mmol/L (ref 101–111)
CO2: 23 mmol/L (ref 22–32)
CREATININE: 1.19 mg/dL — AB (ref 0.44–1.00)
Calcium: 9 mg/dL (ref 8.9–10.3)
GFR, EST AFRICAN AMERICAN: 50 mL/min — AB (ref >60)
GFR, EST NON AFRICAN AMERICAN: 43 mL/min — AB (ref >60)
Glucose, Bld: 169 mg/dL — ABNORMAL HIGH (ref 65–99)
Potassium: 4.3 mmol/L (ref 3.5–5.1)
SODIUM: 138 mmol/L (ref 135–145)

## 2016-12-13 LAB — GLUCOSE, CAPILLARY
GLUCOSE-CAPILLARY: 129 mg/dL — AB (ref 65–99)
GLUCOSE-CAPILLARY: 175 mg/dL — AB (ref 65–99)
GLUCOSE-CAPILLARY: 182 mg/dL — AB (ref 65–99)
Glucose-Capillary: 134 mg/dL — ABNORMAL HIGH (ref 65–99)

## 2016-12-13 MED ORDER — FE FUMARATE-B12-VIT C-FA-IFC PO CAPS
1.0000 | ORAL_CAPSULE | Freq: Two times a day (BID) | ORAL | Status: DC
  Administered 2016-12-13 – 2016-12-14 (×3): 1 via ORAL
  Filled 2016-12-13 (×4): qty 1

## 2016-12-13 NOTE — Progress Notes (Signed)
Pt has remained alert and oriented this shift. Medicated for pain with Tylenol with good results. Pt is REFUSING all narcotic medication this shift for the fear of emesis. Pt states that she does not do well with Narcotic medication. Tramadol was mentioned as an alternative and pt did not want md called for tramadol order. She states that tramadol also makes her vomit. Dressing dry and intact. Foley patent and draining urine. No drainage in wound vac. Pt able to sleep in between care. No nausea or vomitting during the night. Pt able to tolerate sips of clear liquids through the shift.

## 2016-12-13 NOTE — Progress Notes (Signed)
[facility], auth # [id number]. Plan is for patient to [facility] pending medical clearance. Patient is aware of above. [facility] coordinator [facility] is aware of above.   [employer]Bailey Payne Garske, LCSW [phone]

## 2016-12-13 NOTE — Progress Notes (Signed)
   Subjective: 1 Day Post-Op Procedure(s) (LRB): TOTAL HIP ARTHROPLASTY ANTERIOR APPROACH (Right) Patient reports pain as moderate.   Patient is well, and has had no acute complaints or problems. Not tolerating narcotics due to nausea. Requesting to only take tylenol for pain.  Denies any CP, SOB, ABD pain. We will continue therapy today.  Plan is to go Skilled nursing facility after hospital stay.  Objective: Vital signs in last 24 hours: Temp:  [97.5 F (36.4 C)-99.1 F (37.3 C)] 99.1 F (37.3 C) (09/05 0414) Pulse Rate:  [54-83] 64 (09/05 0414) Resp:  [12-20] 18 (09/05 0414) BP: (104-150)/(54-99) 105/54 (09/05 0414) SpO2:  [96 %-100 %] 100 % (09/05 0414) Weight:  [97.5 kg (215 lb)] 97.5 kg (215 lb) (09/04 1132)  Intake/Output from previous day: 09/04 0701 - 09/05 0700 In: 2696.7 [P.O.:760; I.V.:1636.7; IV Piggyback:300] Out: 2575 [Urine:1975; Emesis/NG output:400; Blood:200] Intake/Output this shift: No intake/output data recorded.   Recent Labs  12/12/16 1052 12/13/16 0354  HGB 10.8* 9.4*    Recent Labs  12/12/16 1052 12/13/16 0354  WBC 13.1* 10.1  RBC 3.32* 2.85*  HCT 32.4* 28.1*  PLT 271 217    Recent Labs  12/12/16 1052 12/13/16 0354  NA  --  138  K  --  4.3  CL  --  108  CO2  --  23  BUN  --  21*  CREATININE 1.20* 1.19*  GLUCOSE  --  169*  CALCIUM  --  9.0   No results for input(s): LABPT, INR in the last 72 hours.  EXAM General - Patient is Alert, Appropriate and Oriented Extremity - Neurovascular intact Sensation intact distally Intact pulses distally Dorsiflexion/Plantar flexion intact No cellulitis present Compartment soft Dressing - dressing C/D/I, no drainage and wound vac intact, no drainage Motor Function - intact, moving foot and toes well on exam.   Past Medical History:  Diagnosis Date  . Anemia   . Anxiety   . Arthritis   . Asthma   . Chronic kidney disease    Chronic Kidney Disease---Stage III  . Coronary artery  disease   . DDD (degenerative disc disease), lumbar   . Diabetes mellitus without complication (HCC)   . GERD (gastroesophageal reflux disease)   . Gout   . Heart murmur   . Hyperlipidemia   . Hypertension   . Osteoarthritis   . Vitamin B 12 deficiency     Assessment/Plan:   1 Day Post-Op Procedure(s) (LRB): TOTAL HIP ARTHROPLASTY ANTERIOR APPROACH (Right) Active Problems:   Primary localized osteoarthritis of right hip  Estimated body mass index is 33.67 kg/m as calculated from the following:   Height as of this encounter: 5\' 7"  (1.702 m).   Weight as of this encounter: 97.5 kg (215 lb). Advance diet Up with therapy  Needs BM Acute post op blood loss anemia with underlying chronic anemia, start Iron  Recheck labs in the am CM to assist with discharge Encouraged incentive spirometer Continue with IV fluids, monitor BP  DVT Prophylaxis - Lovenox, Foot Pumps and TED hose Weight-Bearing as tolerated to right leg   T. Cranston Neighborhris Gaines, PA-C [facility] 12/13/2016, 8:19 AM

## 2016-12-13 NOTE — Progress Notes (Signed)
Physical Therapy Treatment Patient Details Name: Sierra Guzman MRN: [id number] DOB: 09-07-1940 Today's Date: 12/13/2016    History of Present Illness Pt underwent R THR anterior approach with post-op nausea. She is POD#0 at time of PT evaluation. PMH includes DMII, CKD, anxiety, hyperlipidemia, GERD, HTN, bilateral TKR, L THR, and CAD.     PT Comments    Pt continues to make progress with physical therapy but is fatigued with ambulation and only able to slightly increase her distance during PM session. She is able to complete all seated exercises with improving R hip flexion strength. Reports increased pain this afternoon reporting 8/10 R hip pain at start of session. Pt will benefit from PT services to address deficits in strength, balance, and mobility in order to return to full function at home.      Follow Up Recommendations  SNF     Equipment Recommendations  Rolling walker with 5" wheels    Recommendations for Other Services OT consult     Precautions / Restrictions Precautions Precautions: Anterior Hip Precaution Booklet Issued: Yes (comment) Restrictions Weight Bearing Restrictions: Yes RLE Weight Bearing: Weight bearing as tolerated    Mobility  Bed Mobility Overal bed mobility: Needs Assistance Bed Mobility: Sit to Supine     Supine to sit: Min assist     General bed mobility comments: Pt continues to require bilateral LE assist when returning to bed  Transfers Overall transfer level: Needs assistance Equipment used: Rolling walker (2 wheeled) Transfers: Sit to/from Stand Sit to Stand: Min guard         General transfer comment: Pt continues to demontrate improved safety awareness and stability with transfers. Continues to require UE assist for transfers  Ambulation/Gait Ambulation/Gait assistance: Min guard Ambulation Distance (Feet): 80 Feet Assistive device: Rolling walker (2 wheeled) Gait Pattern/deviations: Decreased weight shift to  right;Decreased stance time - right;Decreased step length - left;Step-to pattern Gait velocity: Decreased Gait velocity interpretation: <1.8 ft/sec, indicative of risk for recurrent falls General Gait Details: Pt able to slightly increase her ambulation distance this afternoon but unable to go farther. Pt continually monitored for fatigue. Improved step length and progression to partial step-through pattern again this afternoon   Stairs            Wheelchair Mobility    Modified Rankin (Stroke Patients Only)       Balance Overall balance assessment: Needs assistance Sitting-balance support: No upper extremity supported Sitting balance-Leahy Scale: Good     Standing balance support: Bilateral upper extremity supported Standing balance-Leahy Scale: Fair Standing balance comment: Pt able to remain in standing with bilateral UE support on rolling walker                            Cognition Arousal/Alertness: Lethargic Behavior During Therapy: WFL for tasks assessed/performed Overall Cognitive Status: Within Functional Limits for tasks assessed                                        Exercises Total Joint Exercises Ankle Circles/Pumps: AROM;Both;10 reps;Seated;Other (comment) (Heel raises) Quad Sets: Strengthening;Both;10 reps;Supine Gluteal Sets: Strengthening;Both;10 reps;Supine Towel Squeeze: Strengthening;Both;10 reps;Supine Short Arc Quad: Strengthening;Right;10 reps;Supine Heel Slides: Strengthening;Right;10 reps;Supine Hip ABduction/ADduction: Strengthening;10 reps;Both;Seated Straight Leg Raises: Strengthening;Right;10 reps;Supine Long Arc Quad: Strengthening;Right;10 reps;Seated Knee Flexion: Strengthening;Right;10 reps;Seated Marching in Standing: Strengthening;Both;10 reps;Seated    General Comments  Pertinent Vitals/Pain Pain Assessment: 0-10 Pain Score: 8  Pain Location: R hip Pain Descriptors / Indicators: Operative  site guarding    Home Living                      Prior Function            PT Goals (current goals can now be found in the care plan section) Acute Rehab PT Goals Patient Stated Goal: "I need to go to rehab." Pt eager to return to prior level of function PT Goal Formulation: With patient/family Time For Goal Achievement: 12/26/16 Potential to Achieve Goals: Good Progress towards PT goals: Progressing toward goals    Frequency    BID      PT Plan Current plan remains appropriate    Co-evaluation              AM-PAC PT "6 Clicks" Daily Activity  Outcome Measure  Difficulty turning over in bed (including adjusting bedclothes, sheets and blankets)?: Unable Difficulty moving from lying on back to sitting on the side of the bed? : Unable Difficulty sitting down on and standing up from a chair with arms (e.g., wheelchair, bedside commode, etc,.)?: A Little Help needed moving to and from a bed to chair (including a wheelchair)?: A Little Help needed walking in hospital room?: A Little Help needed climbing 3-5 steps with a railing? : A Lot 6 Click Score: 13    End of Session Equipment Utilized During Treatment: Gait belt Activity Tolerance: Patient tolerated treatment well Patient left: with family/visitor present;in bed;with bed alarm set;with call bell/phone within reach;with SCD's reapplied Nurse Communication: Other (comment) (Ready for bath, BSC needs to be emptied) PT Visit Diagnosis: Unsteadiness on feet (R26.81);Muscle weakness (generalized) (M62.81);Pain Pain - Right/Left: Right Pain - part of body: Hip     Time: [id number] PT Time Calculation (min) (ACUTE ONLY): 36 min  Charges:  $Gait Training: 8-22 mins $Therapeutic Exercise: 8-22 mins                    G Codes:       Sharalyn InkJason D Angelea Penny PT, DPT     [name] 12/13/2016, 2:41 PM

## 2016-12-13 NOTE — Progress Notes (Signed)
Clinical Child psychotherapistocial Worker (CSW) faxed in clinicals to [facility] [employer] today to start [place]. [facility] pending medical clearance and [facility] authorization.  [employer]Bailey Gyasi Hazzard, LCSW [phone]

## 2016-12-13 NOTE — Progress Notes (Signed)
Per conversation with MD, held BP meds this am 

## 2016-12-13 NOTE — Progress Notes (Signed)
Pt pain 5/10 refusing to take any narcotic medications tonight. Pt states that they make her very sick. Pt would like to stick with just Tylenol for pain control.

## 2016-12-13 NOTE — Progress Notes (Signed)
Physical Therapy Treatment Patient Details Name: Sierra NajjarRena M Guzman MRN: [id number] DOB: Sep 16, 1940 Today's Date: 12/13/2016    History of Present Illness Pt underwent R THR anterior approach with post-op nausea. She is POD#0 at time of PT evaluation. PMH includes DMII, CKD, anxiety, hyperlipidemia, GERD, HTN, bilateral TKR, L THR, and CAD.     PT Comments    Pt making good progress with therapy on this date. She continues to require assist for LE management during bed mobility. Improved sequencing with transfers. She is able to ambulate from room to RN station and back to bed. Gait is slow but steady and pt is stable with use of rolling walker. Education/cues provided for proper sequencing with rolling walker. Step-to pattern initially but progresses with cues to partial step-through pattern. She is able to complete bed exercises as instructed. Pt will benefit from PT services to address deficits in strength, balance, and mobility in order to return to full function at home.   Follow Up Recommendations  SNF     Equipment Recommendations  Rolling walker with 5" wheels    Recommendations for Other Services OT consult     Precautions / Restrictions Precautions Precautions: Anterior Hip Precaution Booklet Issued: Yes (comment) Restrictions Weight Bearing Restrictions: Yes RLE Weight Bearing: Weight bearing as tolerated    Mobility  Bed Mobility Overal bed mobility: Needs Assistance Bed Mobility: Supine to Sit     Supine to sit: Min assist     General bed mobility comments: Pt requiring assist for LE management during bed mobility when exiting on the R side. HOB elevated and use of bed rails  Transfers Overall transfer level: Needs assistance Equipment used: Rolling walker (2 wheeled) Transfers: Sit to/from Stand Sit to Stand: Min guard         General transfer comment: Pt moves slowly but is steady. Improved weight acceptance to RLE. Steady and stable in  standing  Ambulation/Gait Ambulation/Gait assistance: Min guard Ambulation Distance (Feet): 50 Feet Assistive device: Rolling walker (2 wheeled) Gait Pattern/deviations: Decreased weight shift to right;Decreased stance time - right;Decreased step length - left;Step-to pattern Gait velocity: Decreased Gait velocity interpretation: <1.8 ft/sec, indicative of risk for recurrent falls General Gait Details: Pt able to ambulate from room to RN station and back. Gait is slow but steady and pt is stable with use of rolling walker. Education/cues provided for proper sequencing with rolling walker. Step-to pattern initially but progresses with cues to partial step-through pattern   Stairs            Wheelchair Mobility    Modified Rankin (Stroke Patients Only)       Balance Overall balance assessment: Needs assistance Sitting-balance support: No upper extremity supported Sitting balance-Leahy Scale: Good     Standing balance support: Bilateral upper extremity supported Standing balance-Leahy Scale: Fair Standing balance comment: Pt able to remain in standing with bilateral UE support on rolling walker                            Cognition Arousal/Alertness: Lethargic Behavior During Therapy: WFL for tasks assessed/performed Overall Cognitive Status: Within Functional Limits for tasks assessed                                        Exercises Total Joint Exercises Ankle Circles/Pumps: AROM;Both;10 reps;Supine Quad Sets: Strengthening;Both;10 reps;Supine Gluteal Sets: Strengthening;Both;10 reps;Supine  Towel Squeeze: Strengthening;Both;10 reps;Supine Short Arc Quad: Strengthening;Right;10 reps;Supine Heel Slides: Strengthening;Right;10 reps;Supine Hip ABduction/ADduction: Strengthening;Right;10 reps;Supine Straight Leg Raises: Strengthening;Right;10 reps;Supine    General Comments        Pertinent Vitals/Pain Pain Assessment: 0-10 Pain Score: 3   Pain Location: R hip Pain Descriptors / Indicators: Operative site guarding    Home Living                      Prior Function            PT Goals (current goals can now be found in the care plan section) Acute Rehab PT Goals Patient Stated Goal: "I need to go to rehab." Pt eager to return to prior level of function PT Goal Formulation: With patient/family Time For Goal Achievement: 12/26/16 Potential to Achieve Goals: Good Progress towards PT goals: Progressing toward goals    Frequency    BID      PT Plan Current plan remains appropriate    Co-evaluation              AM-PAC PT "6 Clicks" Daily Activity  Outcome Measure  Difficulty turning over in bed (including adjusting bedclothes, sheets and blankets)?: Unable Difficulty moving from lying on back to sitting on the side of the bed? : Unable Difficulty sitting down on and standing up from a chair with arms (e.g., wheelchair, bedside commode, etc,.)?: A Little Help needed moving to and from a bed to chair (including a wheelchair)?: A Little Help needed walking in hospital room?: A Little Help needed climbing 3-5 steps with a railing? : A Lot 6 Click Score: 13    End of Session Equipment Utilized During Treatment: Gait belt Activity Tolerance: Patient tolerated treatment well Patient left: with call bell/phone within reach;in chair;with chair alarm set;with family/visitor present   PT Visit Diagnosis: Unsteadiness on feet (R26.81);Muscle weakness (generalized) (M62.81);Pain Pain - Right/Left: Right Pain - part of body: Hip     Time: 1610-9604 PT Time Calculation (min) (ACUTE ONLY): 30 min  Charges:  $Gait Training: 8-22 mins $Therapeutic Exercise: 8-22 mins                    G Codes:      Sierra Guzman PT, DPT     Sierra Guzman 12/13/2016, 11:16 AM

## 2016-12-14 ENCOUNTER — Encounter
Admission: RE | Admit: 2016-12-14 | Discharge: 2016-12-14 | Disposition: A | Discharge status: Home / Self Care | Payer: Medicare HMO | Source: Ambulatory Visit | Attending: Internal Medicine | Admitting: Internal Medicine

## 2016-12-14 LAB — CBC
HEMATOCRIT: 26.8 % — AB (ref 35.0–47.0)
Hemoglobin: 9.2 g/dL — ABNORMAL LOW (ref 12.0–16.0)
MCH: 33.6 pg (ref 26.0–34.0)
MCHC: 34.4 g/dL (ref 32.0–36.0)
MCV: 97.8 fL (ref 80.0–100.0)
PLATELETS: 197 10*3/uL (ref 150–440)
RBC: 2.74 MIL/uL — AB (ref 3.80–5.20)
RDW: 13.8 % (ref 11.5–14.5)
WBC: 10.2 10*3/uL (ref 3.6–11.0)

## 2016-12-14 LAB — BASIC METABOLIC PANEL
ANION GAP: 6 (ref 5–15)
BUN: 21 mg/dL — AB (ref 6–20)
CO2: 24 mmol/L (ref 22–32)
Calcium: 8.8 mg/dL — ABNORMAL LOW (ref 8.9–10.3)
Chloride: 106 mmol/L (ref 101–111)
Creatinine, Ser: 1.29 mg/dL — ABNORMAL HIGH (ref 0.44–1.00)
GFR calc Af Amer: 45 mL/min — ABNORMAL LOW (ref >60)
GFR, EST NON AFRICAN AMERICAN: 39 mL/min — AB (ref >60)
GLUCOSE: 169 mg/dL — AB (ref 65–99)
POTASSIUM: 3.9 mmol/L (ref 3.5–5.1)
Sodium: 136 mmol/L (ref 135–145)

## 2016-12-14 LAB — SURGICAL PATHOLOGY

## 2016-12-14 LAB — GLUCOSE, CAPILLARY
GLUCOSE-CAPILLARY: 122 mg/dL — AB (ref 65–99)
Glucose-Capillary: 172 mg/dL — ABNORMAL HIGH (ref 65–99)

## 2016-12-14 MED ORDER — METHOCARBAMOL 500 MG PO TABS: 500.0000 mg | ORAL_TABLET | Freq: Four times a day (QID) | ORAL | 0 refills | Status: DC | PRN

## 2016-12-14 MED ORDER — BISACODYL 10 MG RE SUPP: 10.0000 mg | Freq: Every day | RECTAL | 0 refills | Status: DC | PRN

## 2016-12-14 MED ORDER — ENOXAPARIN SODIUM 40 MG/0.4ML ~~LOC~~ SOLN: 40.0000 mg | SUBCUTANEOUS | 0 refills | Status: DC

## 2016-12-14 MED ORDER — DOCUSATE SODIUM 100 MG PO CAPS: 100.0000 mg | ORAL_CAPSULE | Freq: Two times a day (BID) | ORAL | 0 refills | Status: DC

## 2016-12-14 MED ORDER — SODIUM CHLORIDE 0.9 % IV BOLUS (SEPSIS)
500.0000 mL | Freq: Once | INTRAVENOUS | Status: AC
  Administered 2016-12-14: 500 mL via INTRAVENOUS

## 2016-12-14 MED ORDER — ONDANSETRON HCL 4 MG PO TABS: 4.0000 mg | ORAL_TABLET | Freq: Four times a day (QID) | ORAL | 0 refills | Status: DC | PRN

## 2016-12-14 MED ORDER — ACETAMINOPHEN 500 MG PO TABS: 1000.0000 mg | ORAL_TABLET | Freq: Four times a day (QID) | ORAL | 0 refills | Status: DC | PRN

## 2016-12-14 NOTE — Progress Notes (Signed)
Physical Therapy Treatment Patient Details Name: Sierra Guzman MRN: [id number] DOB: [birth year] Today's Date: 12/14/2016    History of Present Illness 76 y/o female w/p R THR anterior approach 9/4.PMH includes DMII, CKD, anxiety, hyperlipidemia, GERD, HTN, bilateral TKR, L THR, and CAD.     PT Comments    Pt did well with PT despite c/o pain t/o session.  She showed increased bed mobility and strength with some exercises but did not feel as though she could increase ambulation distance.  She did have increased confidence and speed with ambulation today, but is still hesitant and reliant on the walker.  Pt eager to get to rehab and continue to PT as her pain levels decrease.     Follow Up Recommendations  SNF     Equipment Recommendations       Recommendations for Other Services       Precautions / Restrictions Precautions Precautions: Anterior Hip Restrictions Weight Bearing Restrictions: Yes RLE Weight Bearing: Weight bearing as tolerated    Mobility  Bed Mobility Overal bed mobility: Needs Assistance Bed Mobility: Sit to Supine     Supine to sit: Min guard     General bed mobility comments: Pt was able to use L LE to assist R to EOB, slow/deliberate with the effort but able to get to sitting w/o direct physical assist  Transfers Overall transfer level: Needs assistance Equipment used: Rolling walker (2 wheeled) Transfers: Sit to/from Stand Sit to Stand: Min guard         General transfer comment: reminders for positioning and sequencing, able to rise w/o assist, despite cuing she did "flop" back into seat after ambulation  Ambulation/Gait Ambulation/Gait assistance: Min guard Ambulation Distance (Feet): 80 Feet Assistive device: Rolling walker (2 wheeled)       General Gait Details: Pt with slow but safe ambulation.  She was fatigued with the effort (O2 did drop to 88% on room air, increased to mid/high 90s once sitting).  She did not feel able to go any  further than previous ambulation attempts, but ultimately was safe and showed increased confidence despite fatigue.    Stairs            Wheelchair Mobility    Modified Rankin (Stroke Patients Only)       Balance Overall balance assessment: Needs assistance Sitting-balance support: No upper extremity supported Sitting balance-Leahy Scale: Good     Standing balance support: Bilateral upper extremity supported Standing balance-Leahy Scale: Fair Standing balance comment: Good balance with walker use, some unsteadiness with transition to sitting/moving UEs                            Cognition Arousal/Alertness: Awake/alert Behavior During Therapy: WFL for tasks assessed/performed Overall Cognitive Status: Within Functional Limits for tasks assessed                                        Exercises Total Joint Exercises Ankle Circles/Pumps: 20 reps;Strengthening Quad Sets: 15 reps;Strengthening Gluteal Sets: 15 reps;Strengthening Short Arc Quad: Strengthening;15 reps Heel Slides: Strengthening;10 reps Hip ABduction/ADduction: Strengthening;15 reps Straight Leg Raises: AAROM;10 reps    General Comments        Pertinent Vitals/Pain Pain Assessment: 0-10 Pain Score: 4  Pain Location: R hip, reports some increased sore with activity and WBing but is able to participate fully  Home Living                      Prior Function            PT Goals (current goals can now be found in the care plan section) Progress towards PT goals: Progressing toward goals    Frequency    BID      PT Plan Current plan remains appropriate    Co-evaluation              AM-PAC PT "6 Clicks" Daily Activity  Outcome Measure  Difficulty turning over in bed (including adjusting bedclothes, sheets and blankets)?: A Lot Difficulty moving from lying on back to sitting on the side of the bed? : A Lot Difficulty sitting down on and  standing up from a chair with arms (e.g., wheelchair, bedside commode, etc,.)?: A Little Help needed moving to and from a bed to chair (including a wheelchair)?: A Little Help needed walking in hospital room?: A Little Help needed climbing 3-5 steps with a railing? : A Lot 6 Click Score: 15    End of Session Equipment Utilized During Treatment: Gait belt Activity Tolerance: Patient tolerated treatment well Patient left: with family/visitor present;in bed;with bed alarm set;with call bell/phone within reach;with SCD's reapplied Nurse Communication: Mobility status PT Visit Diagnosis: Unsteadiness on feet (R26.81);Muscle weakness (generalized) (M62.81);Pain Pain - Right/Left: Right Pain - part of body: Hip     Time: [id number] PT Time Calculation (min) (ACUTE ONLY): 29 min  Charges:  $Gait Training: 8-22 mins $Therapeutic Exercise: 8-22 mins                    G Codes:       Malachi ProGalen R Khaleed Holan, DPT 12/14/2016, 11:16 AM

## 2016-12-14 NOTE — Care Management Important Message (Signed)
Important Message  Patient Details  Name: Otho NajjarRena M Samet MRN: [id number] Date of Birth: [birth year]   Medicare Important Message Given:  N/A - LOS <3 / Initial given by admissions    Marily MemosLisa M Josanna Hefel, RN 12/14/2016, 2:58 PM

## 2016-12-14 NOTE — Progress Notes (Signed)
Patient is being [facility]. Wound vac changed over to prevena. Family made aware. IV removed with cath intact. Report called. Sent paperwork,  prevena booklet, and scripts sent with patient.

## 2016-12-14 NOTE — Progress Notes (Signed)
Patient's BP 100/55 this am, no IV fluids running, HCTZ and Lisinopril ordered.  Notified MD. New orders to hold BP meds and give 500 NS bolus.

## 2016-12-14 NOTE — Clinical Social Work Placement (Signed)
   CLINICAL SOCIAL WORK PLACEMENT  NOTE  Date:  12/14/2016  Patient Details  Name: Sierra NajjarRena M Zegers MRN: [id number] Date of Birth: [birth year]  Clinical Social Work is seeking post-discharge placement for this patient at the Skilled  Nursing Facility level of care (*CSW will initial, date and re-position this form in  chart as items are completed):  Yes   Patient/family provided with [place] Clinical Social Work Department's list of facilities offering this level of care within the geographic area requested by the patient (or if unable, by the patient's family).  Yes   Patient/family informed of their freedom to choose among providers that offer the needed level of care, that participate in Medicare, Medicaid or managed care program needed by the patient, have an available bed and are willing to accept the patient.  Yes   Patient/family informed of [facility] ownership interest in [facility] and [facility], as well as of the fact that they are under no obligation to receive care at these facilities.  PASRR submitted to EDS on       PASRR number received on       Existing PASRR number confirmed on 12/12/16     FL2 transmitted to all facilities in geographic area requested by pt/family on 12/12/16     FL2 transmitted to all facilities within larger geographic area on       Patient informed that his/her managed care company has contracts with or will negotiate with certain facilities, including the following:        Yes   Patient/family informed of bed offers received.  Patient chooses bed at  [facility] )     Physician recommends and patient chooses bed at      Patient to be transferred to  [facility] ) on 12/14/16.  Patient to be transferred to facility by  [facility] )     Patient family notified on 12/14/16 of transfer.  Name of family member notified:   (Patinet's son Christen BameRonnie and daughter Scheryl MartenVerna are at bedside and aware of D/C today. )      PHYSICIAN       Additional Comment:    _______________________________________________ Mujahid Jalomo, Darleen CrockerBailey M, LCSW 12/14/2016, 3:12 PM

## 2016-12-14 NOTE — Progress Notes (Signed)
Patient is medically stable for D/C to [facility] today. [facility] patient can come today to room 204-A. RN will call report at [phone] and arrange EMS for transport. Clinical Child psychotherapistocial Worker (CSW) sent D/C orders [facility] via [employer]. [employer] authorization has been received through [employer]. Patient is aware of above. Patient's son Christen BameRonnie and daughter Scheryl MartenVerna are at bedside and aware of above. Please reconsult if future social work needs arise. CSW signing off.   [employer]Bailey Kea Callan, LCSW [phone]

## 2016-12-14 NOTE — Progress Notes (Signed)
   Subjective: 2 Days Post-Op Procedure(s) (LRB): TOTAL HIP ARTHROPLASTY ANTERIOR APPROACH (Right) Patient reports pain as mild.   Patient is well, and has had no acute complaints or problems.  Denies any CP, SOB, ABD pain. We will continue therapy today.  Plan is to go Skilled nursing facility after hospital stay.  Objective: Vital signs in last 24 hours: Temp:  [97.8 F (36.6 C)-100.1 F (37.8 C)] 100.1 F (37.8 C) (09/06 0746) Pulse Rate:  [92-103] 103 (09/06 0746) Resp:  [18] 18 (09/05 2139) BP: (100-114)/(51-55) 100/55 (09/06 0746) SpO2:  [94 %-97 %] 94 % (09/06 0746)  Intake/Output from previous day: 09/05 0701 - 09/06 0700 In: 240 [P.O.:240] Out: 301 [Urine:301] Intake/Output this shift: No intake/output data recorded.   Recent Labs  12/12/16 1052 12/13/16 0354 12/14/16 0422  HGB 10.8* 9.4* 9.2*    Recent Labs  12/13/16 0354 12/14/16 0422  WBC 10.1 10.2  RBC 2.85* 2.74*  HCT 28.1* 26.8*  PLT 217 197    Recent Labs  12/13/16 0354 12/14/16 0422  NA 138 136  K 4.3 3.9  CL 108 106  CO2 23 24  BUN 21* 21*  CREATININE 1.19* 1.29*  GLUCOSE 169* 169*  CALCIUM 9.0 8.8*   No results for input(s): LABPT, INR in the last 72 hours.  EXAM General - Patient is Alert, Appropriate and Oriented Extremity - Neurovascular intact Sensation intact distally Intact pulses distally Dorsiflexion/Plantar flexion intact No cellulitis present Compartment soft Dressing - dressing C/D/I, no drainage and wound vac intact, no drainage Motor Function - intact, moving foot and toes well on exam.   Past Medical History:  Diagnosis Date  . Anemia   . Anxiety   . Arthritis   . Asthma   . Chronic kidney disease    Chronic Kidney Disease---Stage III  . Coronary artery disease   . DDD (degenerative disc disease), lumbar   . Diabetes mellitus without complication (HCC)   . GERD (gastroesophageal reflux disease)   . Gout   . Heart murmur   . Hyperlipidemia   .  Hypertension   . Osteoarthritis   . Vitamin B 12 deficiency     Assessment/Plan:   2 Days Post-Op Procedure(s) (LRB): TOTAL HIP ARTHROPLASTY ANTERIOR APPROACH (Right) Active Problems:   Primary localized osteoarthritis of right hip  Estimated body mass index is 33.67 kg/m as calculated from the following:   Height as of this encounter: 5\' 7"  (1.702 m).   Weight as of this encounter: 97.5 kg (215 lb). Advance diet Up with therapy  Acute post op blood loss anemia with underlying chronic anemia - Hgb stable Encouraged incentive spirometer IV fluid bolus this am, up with therapy, will continue to monitor BP Discharge to SNF this afternoon  DVT Prophylaxis - Lovenox, Foot Pumps and TED hose Weight-Bearing as tolerated to right leg   T. Cranston Neighborhris Ramsay Bognar, PA-C [facility] 12/14/2016, 8:23 AM

## 2016-12-14 NOTE — Discharge Instructions (Signed)

## 2016-12-14 NOTE — Discharge Summary (Signed)
Physician Discharge Summary  Patient ID: Sierra Guzman MRN: [id number] DOB/AGE: April 12, 1940 76 y.o.  Admit date: 12/12/2016 Discharge date: 12/14/2016  Admission Diagnoses:  primary localized osteoarthritis right hip   Discharge Diagnoses: Patient Active Problem List   Diagnosis Date Noted  . Primary localized osteoarthritis of right hip 12/12/2016    Past Medical History:  Diagnosis Date  . Anemia   . Anxiety   . Arthritis   . Asthma   . Chronic kidney disease    Chronic Kidney Disease---Stage III  . Coronary artery disease   . DDD (degenerative disc disease), lumbar   . Diabetes mellitus without complication (HCC)   . GERD (gastroesophageal reflux disease)   . Gout   . Heart murmur   . Hyperlipidemia   . Hypertension   . Osteoarthritis   . Vitamin B 12 deficiency      Transfusion: noone   Consultants (if any):   Discharged Condition: Improved  Hospital Course: Sierra Guzman is an 76 y.o. female who was admitted 12/12/2016 with a diagnosis of right hip osteoarthritis and went to the operating room on 12/12/2016 and underwent the above named procedures.    Surgeries: Procedure(s): TOTAL HIP ARTHROPLASTY ANTERIOR APPROACH on 12/12/2016 Patient tolerated the surgery well. Taken to PACU where she was stabilized and then transferred to the orthopedic floor.  Started on Lovenox 40 q 24 hrs. Foot pumps applied bilaterally at 80 mm. Heels elevated on bed with rolled towels. No evidence of DVT. Negative Homan. Physical therapy started on day #1 for gait training and transfer. OT started day #1 for ADL and assisted devices.  Patient's foley was d/c on day #1. Patient's IV was d/c on day #2.  On post op day #2 patient was stable and ready for discharge to SNF.   TED hose BLE for 6 weeks. Remove at night time Remove wound vac on 12/21/16 and apply honeycomb dressing Follow up with [facility] in 2 weeks for staple removal  Implants:  Medacta 52 DM Mpact cup and liner, DePuy Actis 7  atandard stem with 1.5 mm 28 mm metal head  She was given perioperative antibiotics:  Anti-infectives    Start     Dose/Rate Route Frequency Ordered Stop   12/12/16 1400  ceFAZolin (ANCEF) IVPB 2g/100 mL premix     2 g 200 mL/hr over 30 Minutes Intravenous Every 6 hours 12/12/16 1037 12/13/16 0235   12/12/16 0603  ceFAZolin (ANCEF) 2-4 GM/100ML-% IVPB    Comments:  Machia, Millissa   : cabinet override      12/12/16 0603 12/12/16 0737   12/11/16 2200  ceFAZolin (ANCEF) IVPB 2g/100 mL premix     2 g 200 mL/hr over 30 Minutes Intravenous  Once 12/11/16 2154 12/12/16 0752    .  She was given sequential compression devices, early ambulation, and Lovenox for DVT prophylaxis.  She benefited maximally from the hospital stay and there were no complications.    Recent vital signs:  Vitals:   12/13/16 2139 12/14/16 0746  BP: (!) 114/51 (!) 100/55  Pulse: 92 (!) 103  Resp: 18   Temp: 97.8 F (36.6 C) 100.1 F (37.8 C)  SpO2: 97% 94%    Recent laboratory studies:  Lab Results  Component Value Date   HGB 9.2 (L) 12/14/2016   HGB 9.4 (L) 12/13/2016   HGB 10.8 (L) 12/12/2016   Lab Results  Component Value Date   WBC 10.2 12/14/2016   PLT 197 12/14/2016   Lab  Results  Component Value Date   INR 1.13 11/30/2016   Lab Results  Component Value Date   NA 136 12/14/2016   K 3.9 12/14/2016   CL 106 12/14/2016   CO2 24 12/14/2016   BUN 21 (H) 12/14/2016   CREATININE 1.29 (H) 12/14/2016   GLUCOSE 169 (H) 12/14/2016    Discharge Medications:   Allergies as of 12/14/2016      Reactions   Morphine And Related Nausea And Vomiting      Medication List    TAKE these medications   acetaminophen 500 MG tablet Commonly known as:  TYLENOL Take 2 tablets (1,000 mg total) by mouth every 6 (six) hours as needed for mild pain (or Fever >/= 101).   allopurinol 300 MG tablet Commonly known as:  ZYLOPRIM Take 300 mg by mouth daily.   aspirin EC 81 MG tablet Take 81 mg by mouth  daily.   bisacodyl 10 MG suppository Commonly known as:  DULCOLAX Place 1 suppository (10 mg total) rectally daily as needed for moderate constipation.   cholecalciferol 1000 units tablet Commonly known as:  VITAMIN D Take 1,000 Units by mouth daily.   Cinnamon 500 MG capsule Take 500 mg by mouth daily.   docusate sodium 100 MG capsule Commonly known as:  COLACE Take 1 capsule (100 mg total) by mouth 2 (two) times daily.   enoxaparin 40 MG/0.4ML injection Commonly known as:  LOVENOX Inject 0.4 mLs (40 mg total) into the skin daily.   Fish Oil 1000 MG Caps Take by mouth 1 day or 1 dose.   Fluticasone-Salmeterol 500-50 MCG/DOSE Aepb Commonly known as:  ADVAIR Inhale 1 puff into the lungs 2 (two) times daily.   gabapentin 300 MG capsule Commonly known as:  NEURONTIN Take 600 mg by mouth 3 (three) times daily.   hydrochlorothiazide 25 MG tablet Commonly known as:  HYDRODIURIL Take 25 mg by mouth daily.   lisinopril 40 MG tablet Commonly known as:  PRINIVIL,ZESTRIL Take 40 mg by mouth daily.   metFORMIN 1000 MG tablet Commonly known as:  GLUCOPHAGE Take 1,000 mg by mouth every evening.   methocarbamol 500 MG tablet Commonly known as:  ROBAXIN Take 1 tablet (500 mg total) by mouth every 6 (six) hours as needed for muscle spasms.   metoprolol succinate 25 MG 24 hr tablet Commonly known as:  TOPROL-XL Take 25 mg by mouth daily.   ondansetron 4 MG tablet Commonly known as:  ZOFRAN Take 1 tablet (4 mg total) by mouth every 6 (six) hours as needed for nausea.   ranitidine 300 MG capsule Commonly known as:  ZANTAC Take 300 mg by mouth every evening.   simvastatin 40 MG tablet Commonly known as:  ZOCOR Take 40 mg by mouth daily at 6 PM.   VENTOLIN HFA 108 (90 Base) MCG/ACT inhaler Generic drug:  albuterol Inhale 1-2 puffs into the lungs every 6 (six) hours as needed for wheezing or shortness of breath.   vitamin B-12 1000 MCG tablet Commonly known as:   CYANOCOBALAMIN Take 1,000 mcg by mouth daily.            Discharge Care Instructions        Start     Ordered   12/15/16 0000  enoxaparin (LOVENOX) 40 MG/0.4ML injection  Every 24 hours    Question:  Supervising Provider  Answer:  Rosita KeaMENZ, MICHAEL   12/14/16 1132   12/14/16 0000  ondansetron (ZOFRAN) 4 MG tablet  Every 6 hours PRN  Question:  Supervising Provider  Answer:  Kennedy Bucker   12/14/16 1132   12/14/16 0000  methocarbamol (ROBAXIN) 500 MG tablet  Every 6 hours PRN    Question:  Supervising Provider  Answer:  Kennedy Bucker   12/14/16 1132   12/14/16 0000  bisacodyl (DULCOLAX) 10 MG suppository  Daily PRN    Question:  Supervising Provider  Answer:  Kennedy Bucker   12/14/16 1132   12/14/16 0000  acetaminophen (TYLENOL) 500 MG tablet  Every 6 hours PRN    Question:  Supervising Provider  Answer:  Kennedy Bucker   12/14/16 1132   12/14/16 0000  docusate sodium (COLACE) 100 MG capsule  2 times daily    Question:  Supervising Provider  AnswerKennedy Bucker   12/14/16 1132      Diagnostic Studies: Dg Hip Operative Unilat W Or W/o Pelvis Right  Result Date: 12/12/2016 CLINICAL DATA:  Intraoperative right hip EXAM: OPERATIVE RIGHT HIP (WITH PELVIS IF PERFORMED) 3 VIEWS TECHNIQUE: Fluoroscopic spot image(s) were submitted for interpretation post-operatively. FLUOROSCOPY TIME:  6 seconds COMPARISON:  None. FINDINGS: Interval right total hip arthroplasty without failure or complication. No fracture or dislocation. IMPRESSION: Interval right total hip arthroplasty. Electronically Signed   By: Elige Ko   On: 12/12/2016 09:09   Dg Hip Unilat W Or W/o Pelvis 2-3 Views Right  Result Date: 12/12/2016 CLINICAL DATA:  Status post right total hip replacement today. EXAM: DG HIP (WITH OR WITHOUT PELVIS) 2-3V RIGHT COMPARISON:  None. FINDINGS: Right hip arthroplasty is in place. The device is located. No fracture is identified. There is some gas in the soft tissues. Surgical staples and  wound drain are noted. IMPRESSION: Status post right hip replacement.  No acute abnormality. Electronically Signed   By: Drusilla Kanner M.D.   On: 12/12/2016 09:52    Disposition:      Contact information for follow-up providers    Kennedy Bucker, MD Follow up in 2 week(s).   Specialty:  Orthopedic Surgery Contact information: [address] [facility] [place] [address] [phone]            Contact information for after-discharge care    Destination    [facility] Follow up.   Specialty:  Skilled Nursing Facility Contact information: [address] [place] [address] [phone]                   Signed: Patience Musca 12/14/2016, 11:34 AM

## 2016-12-18 ENCOUNTER — Other Ambulatory Visit: Payer: Self-pay

## 2016-12-18 MED ORDER — PREGABALIN 50 MG PO CAPS: 50.0000 mg | ORAL_CAPSULE | Freq: Three times a day (TID) | ORAL | 0 refills | Status: DC

## 2016-12-18 NOTE — Telephone Encounter (Signed)
Rx sent to [facility] phone : [phone] , fax : [phone]  

## 2016-12-20 ENCOUNTER — Encounter: Payer: Self-pay | Admitting: Gerontology

## 2016-12-20 ENCOUNTER — Non-Acute Institutional Stay (SKILLED_NURSING_FACILITY): Payer: Medicare HMO | Admitting: Gerontology

## 2016-12-20 DIAGNOSIS — M1611 Unilateral primary osteoarthritis, right hip: Secondary | ICD-10-CM

## 2016-12-20 DIAGNOSIS — I959 Hypotension, unspecified: Secondary | ICD-10-CM

## 2016-12-20 NOTE — Progress Notes (Deleted)
History   Surgery Date Laterality Comments  TOTAL KNEE ARTHROPLASTY   both knees  TONSILLECTOMY     BACK SURGERY 2006  DDD  JOINT REPLACEMENT   right 2005; left twice 2004, 2011  ARTHROPLASTY HIP TOTAL 12/26/2013 Left Procedure: ARTHROPLASTY HIP TOTAL; Surgeon: Christell FaithPaul Francis Lachiewicz, MD; Location: [facility] OR; Service: Orthopedics; Laterality: Left;   Medical History   Medical History Date Comments  Diabetes type 2, controlled (CMS-HCC) 10/28/2010   B12 deficiency 10/28/2010   Hypertension 10/28/2010   Coronary artery disease 10/28/2010   Anemia, unspecified 10/28/2010   Gout 10/28/2010   Personal history of colonic polyps 10/28/2010   Obesity (BMI 30-39.9), unspecified 10/28/2010   Osteoarthritis 10/28/2010 bilateral knees, left hip  Mild vitamin D deficiency, unspecified 02/22/2011   Shortness of breath dyspnea    Aortic sclerosis    Heart murmur, unspecified    Hyperlipidemia LDL goal < 100 10/28/2010 managed by Dr. Burr Medicoingle  Osteoarthritis    Degenerative disc disease 2006   Hx of cardiovascular stress test 09/23/2009 Lexiscan Normal Cardiolite test, EF 61%. LV size and function - normal  Hx of echocardiogram 05/27/2008 EF 55%; Mild aortic insufficiency with minimal aortic sclerosis. L vent normal.  Hx of cardiac catheterization 2006 LAD 50%, D1 50%, rest normal.  Syncope 2013   Interstitial pneumonia , unspecified (CMS-HCC)     Family History   Medical History Relation Name Comments  Stroke Brother    Diabetes Mother    Stroke Mother     Relation Name Status Comments  Brother     Father  Deceased   Mother  Deceased    Social History   Tobacco Use Types Packs/Day Years Used Date  Former Smoker  0.25 10 Quit: 08/18/1990  Smokeless Tobacco: Never Used      Alcohol Use Drinks/Week oz/Week Comments  No 0 Standard drinks or equivalent  0.0     Sex [employer]igned at [employer] Date Recorded  Not on file    Obstetrics History   Grav  Para Term Pre Abrt (TAB) (SAB) (Ect) Mult Lvng Comments  5 5        5     Date Out GA Labor/2nd Wt Sex Deliv Anes Pre Liv A1 A5 Name Loc Clin   Para                Para                Para                Para                Para                HIP SURGERY  Left    Medical History   Medical History Date Comments  Diabetes (CMS-HCC)    Heart abnormality    Anemia    Hypertension     Social History   Tobacco Use Types Packs/Day Years Used Date  Never Assessed       Sex Assigned at Birth Date Recorded  Not on file

## 2016-12-20 NOTE — Progress Notes (Signed)
Location:   [facility] Service:  SNF [phone]) Provider:  Toni Arthurs, NP-C  Ringel, Fanny Dance, MD  Patient Care Team: Marcial Pacas, MD as PCP - [facility])  Extended Emergency Contact Information Primary Emergency Contact: Creola Corn Address: [address]          [place], [place] [address] [place] Phone: [phone] Relation: Daughter  Code Status:  FULL Goals of care: Advanced Directive information Advanced Directives 12/20/2016  Does Patient Have a Medical Advance Directive? No  Would patient like information on creating a medical advance directive? -     Chief Complaint  Patient presents with  . Acute Visit    Follow up on hypotension    HPI:  Pt is a 76 y.o. female seen today for an acute visit for concerns about hypotension. Pt was concerned as her B/P this am was lower than her normal. Pt denies any adverse effects from this. Denies dizziness, headache, light-headedness, confusion, n/v,diaphoresis. Discussed with pt that the pressures were acceptable, and this is not uncommon post-op d/t medications, pain meds, and decreased mobility. Assurred pt it was being monitored closely. Pt and family seemed to be more at ease after conversation. Pt has been working with PT/OT. Has a disposable wound vac on the hip incision. Very minimal output. Calves soft, supple. Negative Homan's sign. Pain well controlled. Appetite good. Having regular BMs. VSS. No other complaints.    Past Medical History:  Diagnosis Date  . Anemia 10/28/2010   unspecified  . Anxiety   . Aortic valve sclerosis   . Arthritis   . Asthma   . Chronic kidney disease    Chronic Kidney Disease---Stage III  . Coronary artery disease 10/28/2010  . DDD (degenerative disc disease), lumbar 2006  . Diabetes mellitus without complication ([place])   . Diabetes type 2, controlled ([place]) 10/28/2010  . Dyspnea   . GERD (gastroesophageal reflux  disease)   . Gout 10/28/2010  . Heart abnormality   . Heart murmur   . History of cardiac catheterization 2006   LAD 50%, D1 50% rest normal.   . History of cardiovascular stress test 09/23/2009    lexiscan Normal Cardiolite test, EF 61%. LV size and function - normal  . History of echocardiogram 05/27/2008   EF 55%; Mild aortic insufficiency with minimal aortic sclerosis. L vent normal.   . Hyperlipidemia   . Hyperlipidemia LDL goal <100 10/28/2010   managed by Dr. Dorena Bodo  . Hypertension 10/28/2010  . 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 Mild vitamin D deficiency 02/22/2011  . Obesity (BMI 30-39.9) 10/28/2010   unspecified  . 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 Personal history of colonic polyps 10/28/2010  . Shortness of breath   . Syncope 2013  . Vitamin B 12 deficiency 10/28/2010   Past Surgical History:  Procedure Laterality Date  . BACK SURGERY  2006   DDD  . BREAST BIOPSY Right    neg  . CARDIAC CATHETERIZATION  2006  . EYE SURGERY Bilateral    Cataract Extraction with IOL  . JOINT REPLACEMENT Left    Left Total Hip Replacement  . JOINT REPLACEMENT     RIGHT 2005; LEFT 2 X 2004, 2011  . REPLACEMENT TOTAL KNEE Left   . REVISION TOTAL KNEE ARTHROPLASTY Left    Dr. Marry Guan  . TONSILLECTOMY    . TOTAL HIP ARTHROPLASTY Right 12/12/2016   Procedure:  TOTAL HIP ARTHROPLASTY ANTERIOR APPROACH;  Surgeon: Hessie Knows, MD;  Location: [facility] ORS;  Service: Orthopedics;  Laterality: Right;  . TOTAL HIP ARTHROPLASTY Left 12/26/2013   Procedure: ARTHROPLASTY HIP TOTAL; Surgeon: Zorita Pang, MD; Location: [place]; Service: Orthopedics; Laterality: left,   . TOTAL KNEE ARTHROPLASTY     both knees    Allergies  Allergen Reactions  . Morphine And Related Nausea And Vomiting    Allergies as of 12/20/2016      Reactions   Morphine And Related Nausea And Vomiting      Medication List       Accurate as of 12/20/16  4:16 PM. Always  use your most recent med list.          acetaminophen 500 MG tablet Commonly known as:  TYLENOL Take 2 tablets (1,000 mg total) by mouth every 6 (six) hours as needed for mild pain (or Fever >/= 101).   allopurinol 300 MG tablet Commonly known as:  ZYLOPRIM Take 300 mg by mouth daily.   aspirin EC 81 MG tablet Take 81 mg by mouth daily.   bisacodyl 10 MG suppository Commonly known as:  DULCOLAX Place 1 suppository (10 mg total) rectally daily as needed for moderate constipation.   celecoxib 100 MG capsule Commonly known as:  CELEBREX Take 100 mg by mouth daily.   cetirizine 10 MG tablet Commonly known as:  ZYRTEC Take 10 mg by mouth at bedtime.   cholecalciferol 1000 units tablet Commonly known as:  VITAMIN D Take 1,000 Units by mouth daily.   Cinnamon 500 MG capsule Take 500 mg by mouth daily.   diphenhydrAMINE 25 mg capsule Commonly known as:  BENADRYL Take 25 mg by mouth as needed for itching.   docusate sodium 100 MG capsule Commonly known as:  COLACE Take 1 capsule (100 mg total) by mouth 2 (two) times daily.   enoxaparin 40 MG/0.4ML injection Commonly known as:  LOVENOX Inject 0.4 mLs (40 mg total) into the skin daily.   Fish Oil 1000 MG Caps Take 1 capsule by mouth daily.   Fluticasone-Salmeterol 500-50 MCG/DOSE Aepb Commonly known as:  ADVAIR Inhale 1 puff into the lungs 2 (two) times daily.   guaiFENesin 100 MG/5ML liquid Commonly known as:  ROBITUSSIN Take 200 mg by mouth every 4 (four) hours as needed for cough. Notify provider if cough worsens/ continues longer than 3 days   hydrochlorothiazide 25 MG tablet Commonly known as:  HYDRODIURIL Take 25 mg by mouth daily.   hydrocortisone cream 1 % Apply 1 application topically 2 (two) times daily. Apply to bottom of right foot for 1 week   hydrOXYzine 10 MG tablet Commonly known as:  ATARAX/VISTARIL Take 10 mg by mouth every 4 (four) hours as needed for itching.   lisinopril 40 MG  tablet Commonly known as:  PRINIVIL,ZESTRIL Take 40 mg by mouth daily.   metFORMIN 1000 MG tablet Commonly known as:  GLUCOPHAGE Take 1,000 mg by mouth every evening.   methocarbamol 500 MG tablet Commonly known as:  ROBAXIN Take 1 tablet (500 mg total) by mouth every 6 (six) hours as needed for muscle spasms.   metoprolol succinate 25 MG 24 hr tablet Commonly known as:  TOPROL-XL Take 25 mg by mouth daily.   ondansetron 4 MG tablet Commonly known as:  ZOFRAN Take 1 tablet (4 mg total) by mouth every 6 (six) hours as needed for nausea.   pregabalin 50 MG capsule Commonly known as:  LYRICA Take 1  capsule (50 mg total) by mouth 3 (three) times daily.   ranitidine 300 MG capsule Commonly known as:  ZANTAC Take 300 mg by mouth every evening.   simvastatin 40 MG tablet Commonly known as:  ZOCOR Take 40 mg by mouth daily at 6 PM.   triamcinolone cream 0.1 % Commonly known as:  KENALOG Apply 1 application topically daily as needed. apply to feet   VENTOLIN HFA 108 (90 Base) MCG/ACT inhaler Generic drug:  albuterol Inhale 1-2 puffs into the lungs every 6 (six) hours as needed for wheezing or shortness of breath.   vitamin B-12 1000 MCG tablet Commonly known as:  CYANOCOBALAMIN Take 1,000 mcg by mouth daily.       Review of Systems  Constitutional: Negative for activity change, appetite change, chills, diaphoresis and fever.  HENT: Negative for congestion, sneezing, sore throat, trouble swallowing and voice change.   Respiratory: Negative for apnea, cough, choking, chest tightness, shortness of breath and wheezing.   Cardiovascular: Negative for chest pain, palpitations and leg swelling.  Gastrointestinal: Negative for abdominal distention, abdominal pain, constipation, diarrhea and nausea.  Genitourinary: Negative for difficulty urinating, dysuria, frequency and urgency.  Musculoskeletal: Positive for arthralgias (typical arthritis). Negative for back pain, gait problem  and myalgias.  Skin: Positive for wound. Negative for color change, pallor and rash.  Neurological: Negative for dizziness, tremors, syncope, speech difficulty, weakness, numbness and headaches.  Psychiatric/Behavioral: Negative for agitation and behavioral problems.  All other systems reviewed and are negative.   Immunization History  Administered Date(s) Administered  . Influenza, Seasonal, Injecte, Preservative Fre 01/24/2013, 01/05/2016  . Influenza,inj,Quad PF,6+ Mos 01/26/2015  . Influenza-Unspecified 01/08/2010, 01/23/2012, 01/24/2013  . Pneumococcal Conjugate-13 08/18/2015  . Pneumococcal Polysaccharide-23 01/10/2007  . Td 06/13/2001  . Tdap 07/27/2014  . Zoster 02/20/2014   Pertinent  Health Maintenance Due  Topic Date Due  . PNA vac Low Risk Adult (1 of 2 - PCV13) 11/04/2004  . INFLUENZA VACCINE  11/08/2016  . DEXA SCAN  Completed   No flowsheet data found. Functional Status Survey:    Vitals:   12/20/16 1525  BP: 119/67  Pulse: 86  Resp: 20  Temp: 98.9 F (37.2 C)  SpO2: 98%  Weight: 218 lb 6.4 oz (99.1 kg)  Height: _0  (1.702 m)   Body mass index is 34.21 kg/m. Physical Exam  Constitutional: She is oriented to person, place, and time. Vital signs are normal. She appears well-developed and well-nourished. She is active and cooperative. She does not appear ill. No distress.  HENT:  Head: Normocephalic and atraumatic.  Mouth/Throat: Uvula is midline, oropharynx is clear and moist and mucous membranes are normal. Mucous membranes are not pale, not dry and not cyanotic.  Eyes: Pupils are equal, round, and reactive to light. Conjunctivae, EOM and lids are normal.  Neck: Trachea normal, normal range of motion and full passive range of motion without pain. Neck supple. No JVD present. No tracheal deviation, no edema and no erythema present. No thyromegaly present.  Cardiovascular: Normal rate, regular rhythm, normal heart sounds, intact distal pulses and normal  pulses.  Exam reveals no gallop, no distant heart sounds and no friction rub.   No murmur heard. Pulses:      Dorsalis pedis pulses are 2+ on the right side, and 2+ on the left side.  No edema  Pulmonary/Chest: Effort normal and breath sounds normal. No accessory muscle usage. No respiratory distress. She has no decreased breath sounds. She has no wheezes. She has  no rhonchi. She has no rales. She exhibits no tenderness.  Abdominal: Normal appearance and bowel sounds are normal. She exhibits no distension and no ascites. There is no tenderness.  Musculoskeletal: She exhibits no edema.       Right hip: She exhibits decreased range of motion, decreased strength, tenderness, swelling and laceration.  Expected osteoarthritis, stiffness; calves soft, supple. Negative Homans sign  Neurological: She is alert and oriented to person, place, and time. She has normal strength.  Skin: Skin is warm and dry. Laceration (right hip) noted. No rash noted. She is not diaphoretic. No cyanosis or erythema. No pallor. Nails show no clubbing.  Psychiatric: She has a normal mood and affect. Her speech is normal and behavior is normal. Judgment and thought content normal. Cognition and memory are normal.  Nursing note and vitals reviewed.   Labs reviewed:  Recent Labs  11/30/16 1019 12/12/16 1052 12/13/16 0354 12/14/16 0422  NA 141  --  138 136  K 4.7  --  4.3 3.9  CL 106  --  108 106  CO2 27  --  23 24  GLUCOSE 124*  --  169* 169*  BUN 20  --  21* 21*  CREATININE 1.12* 1.20* 1.19* 1.29*  CALCIUM 10.1  --  9.0 8.8*   No results for input(s): AST, ALT, ALKPHOS, BILITOT, PROT, ALBUMIN in the last 8760 hours.  Recent Labs  12/12/16 1052 12/13/16 0354 12/14/16 0422  WBC 13.1* 10.1 10.2  HGB 10.8* 9.4* 9.2*  HCT 32.4* 28.1* 26.8*  MCV 97.8 98.6 97.8  PLT 271 217 197   No results found for: TSH No results found for: HGBA1C No results found for: CHOL, HDL, LDLCALC, LDLDIRECT, TRIG,  CHOLHDL  Significant Diagnostic Results in last 30 days:  Dg Hip Operative Unilat W Or W/o Pelvis Right  Result Date: 12/12/2016 CLINICAL DATA:  Intraoperative right hip EXAM: OPERATIVE RIGHT HIP (WITH PELVIS IF PERFORMED) 3 VIEWS TECHNIQUE: Fluoroscopic spot image(s) were submitted for interpretation post-operatively. FLUOROSCOPY TIME:  6 seconds COMPARISON:  None. FINDINGS: Interval right total hip arthroplasty without failure or complication. No fracture or dislocation. IMPRESSION: Interval right total hip arthroplasty. Electronically Signed   By: Kathreen Devoid   On: 12/12/2016 09:09   Dg Hip Unilat W Or W/o Pelvis 2-3 Views Right  Result Date: 12/12/2016 CLINICAL DATA:  Status post right total hip replacement today. EXAM: DG HIP (WITH OR WITHOUT PELVIS) 2-3V RIGHT COMPARISON:  None. FINDINGS: Right hip arthroplasty is in place. The device is located. No fracture is identified. There is some gas in the soft tissues. Surgical staples and wound drain are noted. IMPRESSION: Status post right hip replacement.  No acute abnormality. Electronically Signed   By: Inge Rise M.D.   On: 12/12/2016 09:52    Assessment/Plan 1. Hypotension, unspecified hypotension type  Monitor  Encouraged po fluid intake  2. Primary localized osteoarthritis of right hip  Continue working with PT and OT  Continue exercises as taught by PT/OT  Ice pack to the hip prn  Robaxin 500 mg po Q 6 hours prn  Tylenol 1,000 mg po Q 6 hours prn (pt does not like to take opioids)  Continue Lovenox 40 mg SQ Q day for dvt prophylaxis  Skin, wound care per protocol  Wound Vac care per protocol  Follow up with Orthopedist as instructed  Family/ staff Communication:   Total Time:  Documentation:  Face to Face:  Family/Phone:   Labs/tests ordered:  Cbc, met c  Medication list reviewed and assessed for continued appropriateness.  Vikki Ports, NP-C Geriatrics [facility] [phone], [place] [address] Cell Phone (Mon-Fri 8am-5pm):  [phone] On Call:  [phone] & follow prompts after 5pm & weekends Office Phone:  [phone] Office Fax:  [phone]

## 2016-12-21 ENCOUNTER — Other Ambulatory Visit
Admission: RE | Admit: 2016-12-21 | Discharge: 2016-12-21 | Disposition: A | Discharge status: Home / Self Care | Payer: Medicare HMO | Source: Ambulatory Visit | Attending: Gerontology | Admitting: Gerontology

## 2016-12-21 DIAGNOSIS — N183 Chronic kidney disease, stage 3 (moderate): Secondary | ICD-10-CM | POA: Diagnosis present

## 2016-12-21 LAB — COMPREHENSIVE METABOLIC PANEL
ALBUMIN: 3.1 g/dL — AB (ref 3.5–5.0)
ALK PHOS: 72 U/L (ref 38–126)
ALT: 9 U/L — ABNORMAL LOW (ref 14–54)
AST: 22 U/L (ref 15–41)
Anion gap: 10 (ref 5–15)
BILIRUBIN TOTAL: 0.4 mg/dL (ref 0.3–1.2)
BUN: 22 mg/dL — AB (ref 6–20)
CALCIUM: 9.3 mg/dL (ref 8.9–10.3)
CO2: 23 mmol/L (ref 22–32)
Chloride: 105 mmol/L (ref 101–111)
Creatinine, Ser: 1.11 mg/dL — ABNORMAL HIGH (ref 0.44–1.00)
GFR calc Af Amer: 54 mL/min — ABNORMAL LOW (ref >60)
GFR calc non Af Amer: 47 mL/min — ABNORMAL LOW (ref >60)
GLUCOSE: 144 mg/dL — AB (ref 65–99)
Potassium: 4.1 mmol/L (ref 3.5–5.1)
Sodium: 138 mmol/L (ref 135–145)
TOTAL PROTEIN: 6.4 g/dL — AB (ref 6.5–8.1)

## 2016-12-21 LAB — CBC WITH DIFFERENTIAL/PLATELET
BASOS ABS: 0.1 10*3/uL (ref 0–0.1)
Basophils Relative: 1 %
Eosinophils Absolute: 0.2 10*3/uL (ref 0–0.7)
Eosinophils Relative: 2 %
HCT: 26 % — ABNORMAL LOW (ref 35.0–47.0)
HEMOGLOBIN: 8.8 g/dL — AB (ref 12.0–16.0)
Lymphocytes Relative: 33 %
Lymphs Abs: 3.6 10*3/uL (ref 1.0–3.6)
MCH: 32.9 pg (ref 26.0–34.0)
MCHC: 34 g/dL (ref 32.0–36.0)
MCV: 96.7 fL (ref 80.0–100.0)
Monocytes Absolute: 0.7 10*3/uL (ref 0.2–0.9)
Monocytes Relative: 6 %
NEUTROS PCT: 58 %
Neutro Abs: 6.5 10*3/uL (ref 1.4–6.5)
Platelets: 356 10*3/uL (ref 150–440)
RBC: 2.69 MIL/uL — ABNORMAL LOW (ref 3.80–5.20)
RDW: 14.4 % (ref 11.5–14.5)
WBC: 11 10*3/uL (ref 3.6–11.0)

## 2016-12-29 ENCOUNTER — Emergency Department: Payer: Medicare HMO

## 2016-12-29 DIAGNOSIS — J45909 Unspecified asthma, uncomplicated: Secondary | ICD-10-CM | POA: Diagnosis not present

## 2016-12-29 DIAGNOSIS — I2510 Atherosclerotic heart disease of native coronary artery without angina pectoris: Secondary | ICD-10-CM | POA: Diagnosis not present

## 2016-12-29 DIAGNOSIS — I129 Hypertensive chronic kidney disease with stage 1 through stage 4 chronic kidney disease, or unspecified chronic kidney disease: Secondary | ICD-10-CM | POA: Insufficient documentation

## 2016-12-29 DIAGNOSIS — Z79899 Other long term (current) drug therapy: Secondary | ICD-10-CM | POA: Insufficient documentation

## 2016-12-29 DIAGNOSIS — Z96643 Presence of artificial hip joint, bilateral: Secondary | ICD-10-CM | POA: Diagnosis not present

## 2016-12-29 DIAGNOSIS — M79604 Pain in right leg: Secondary | ICD-10-CM | POA: Insufficient documentation

## 2016-12-29 DIAGNOSIS — Z96652 Presence of left artificial knee joint: Secondary | ICD-10-CM | POA: Insufficient documentation

## 2016-12-29 DIAGNOSIS — Z87891 Personal history of nicotine dependence: Secondary | ICD-10-CM | POA: Diagnosis not present

## 2016-12-29 DIAGNOSIS — N183 Chronic kidney disease, stage 3 (moderate): Secondary | ICD-10-CM | POA: Insufficient documentation

## 2016-12-29 DIAGNOSIS — Z7984 Long term (current) use of oral hypoglycemic drugs: Secondary | ICD-10-CM | POA: Diagnosis not present

## 2016-12-29 DIAGNOSIS — E1122 Type 2 diabetes mellitus with diabetic chronic kidney disease: Secondary | ICD-10-CM | POA: Diagnosis not present

## 2016-12-29 DIAGNOSIS — Z7901 Long term (current) use of anticoagulants: Secondary | ICD-10-CM | POA: Insufficient documentation

## 2016-12-29 NOTE — ED Triage Notes (Signed)
Pt presents via POV c/o right leg pain since today. Reports having right rip replaced 12/12/16. Reports some cramping in LLE. Pain in RLE constant today.

## 2016-12-30 ENCOUNTER — Emergency Department
Admission: EM | Admit: 2016-12-30 | Discharge: 2016-12-30 | Disposition: A | Discharge status: Home / Self Care | Payer: Medicare HMO | Attending: Emergency Medicine | Admitting: Emergency Medicine

## 2016-12-30 DIAGNOSIS — M79604 Pain in right leg: Secondary | ICD-10-CM

## 2016-12-30 NOTE — Discharge Instructions (Signed)

## 2016-12-30 NOTE — ED Provider Notes (Signed)
[facility] Emergency Department Provider Note  ____________________________________________   First MD Initiated Contact with Patient 12/30/16 0149     (approximate)  I have reviewed the triage vital signs and the nursing notes.   HISTORY  Chief Complaint Leg Pain    HPI Sierra Guzman is a 76 y.o. female With medical history as listed below and who had a hip replaced about 2 weeks ago by Dr. Rosita Kea.  She presents by private vehicle for evaluation of  mild to moderate aching pain in her right hip and leg starting today.  She has been doing physical therapy and has been doing well, but developed some pain starting today.  She states that she feels like her leg may be a little bit weak and it was hurting when she was walking.  She was concerned about blood clot.  She denies any other symptoms including chest pain, shortness of breath, fever/chills, nausea, vomiting, abdominal pain.  She states that the hip starts in her hip and groin and radiates downward.  It is better right now.  She has not had any swelling or redness.  She has no numbness or tingling.  Past Medical History:  Diagnosis Date  . Anemia 10/28/2010   unspecified  . Anxiety   . Aortic valve sclerosis   . Arthritis   . Asthma   . Chronic kidney disease    Chronic Kidney Disease---Stage III  . Coronary artery disease 10/28/2010  . DDD (degenerative disc disease), lumbar 2006  . Diabetes mellitus without complication (HCC)   . Diabetes type 2, controlled (HCC) 10/28/2010  . Dyspnea   . GERD (gastroesophageal reflux disease)   . Gout 10/28/2010  . Heart abnormality   . Heart murmur   . History of cardiac catheterization 2006   LAD 50%, D1 50% rest normal.   . History of cardiovascular stress test 09/23/2009    lexiscan Normal Cardiolite test, EF 61%. LV size and function - normal  . History of echocardiogram 05/27/2008   EF 55%; Mild aortic insufficiency with minimal aortic sclerosis. L  vent normal.   . Hyperlipidemia   . Hyperlipidemia LDL goal <100 10/28/2010   managed by Dr. Burr Medico  . Hypertension 10/28/2010  . Interstitial pneumonia (HCC)    unspecified  . Mild vitamin D deficiency 02/22/2011  . Obesity (BMI 30-39.9) 10/28/2010   unspecified  . 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 Personal history of colonic polyps 10/28/2010  . Shortness of breath   . Syncope 2013  . Vitamin B 12 deficiency 10/28/2010    Patient Active Problem List   Diagnosis Date Noted  . Primary localized osteoarthritis of right hip 12/12/2016    Past Surgical History:  Procedure Laterality Date  . BACK SURGERY  2006   DDD  . BREAST BIOPSY Right    neg  . CARDIAC CATHETERIZATION  2006  . EYE SURGERY Bilateral    Cataract Extraction with IOL  . JOINT REPLACEMENT Left    Left Total Hip Replacement  . JOINT REPLACEMENT     RIGHT 2005; LEFT 2 X 2004, 2011  . REPLACEMENT TOTAL KNEE Left   . REVISION TOTAL KNEE ARTHROPLASTY Left    Dr. Ernest Pine  . TONSILLECTOMY    . TOTAL HIP ARTHROPLASTY Right 12/12/2016   Procedure: TOTAL HIP ARTHROPLASTY ANTERIOR APPROACH;  Surgeon: Kennedy Bucker, MD;  Location: [facility] ORS;  Service: Orthopedics;  Laterality: Right;  . TOTAL HIP ARTHROPLASTY Left 12/26/2013  Procedure: ARTHROPLASTY HIP TOTAL; Surgeon: Christell Faith, MD; Location: [facility]; Service: Orthopedics; Laterality: left,   . TOTAL KNEE ARTHROPLASTY     both knees    Prior to Admission medications   Medication Sig Start Date End Date Taking? Authorizing Provider  acetaminophen (TYLENOL) 500 MG tablet Take 2 tablets (1,000 mg total) by mouth every 6 (six) hours as needed for mild pain (or Fever >/= 101). 12/14/16   Evon Slack, PA-C  albuterol (VENTOLIN HFA) 108 (90 Base) MCG/ACT inhaler Inhale 1-2 puffs into the lungs every 6 (six) hours as needed for wheezing or shortness of breath.    [provider]  allopurinol (ZYLOPRIM) 300 MG tablet Take 300 mg  by mouth daily.     [provider]  aspirin EC 81 MG tablet Take 81 mg by mouth daily.    [provider]  bisacodyl (DULCOLAX) 10 MG suppository Place 1 suppository (10 mg total) rectally daily as needed for moderate constipation. 12/14/16   Evon Slack, PA-C  cetirizine (ZYRTEC) 10 MG tablet Take 10 mg by mouth at bedtime.    [provider]  cholecalciferol (VITAMIN D) 1000 units tablet Take 1,000 Units by mouth daily.    [provider]  Cinnamon 500 MG capsule Take 500 mg by mouth daily.    [provider]  diphenhydrAMINE (BENADRYL) 25 mg capsule Take 25 mg by mouth as needed for itching.    [provider]  docusate sodium (COLACE) 100 MG capsule Take 1 capsule (100 mg total) by mouth 2 (two) times daily. 12/14/16   Evon Slack, PA-C  enoxaparin (LOVENOX) 40 MG/0.4ML injection Inject 0.4 mLs (40 mg total) into the skin daily. 12/15/16 12/29/16  Evon Slack, PA-C  Fluticasone-Salmeterol (ADVAIR) 500-50 MCG/DOSE AEPB Inhale 1 puff into the lungs 2 (two) times daily.     [provider]  guaiFENesin (ROBITUSSIN) 100 MG/5ML liquid Take 200 mg by mouth every 4 (four) hours as needed for cough. Notify provider if cough worsens/ continues longer than 3 days    [provider]  hydrochlorothiazide (HYDRODIURIL) 25 MG tablet Take 25 mg by mouth daily.    [provider]  hydrOXYzine (ATARAX/VISTARIL) 10 MG tablet Take 10 mg by mouth every 4 (four) hours as needed for itching.    [provider]  lisinopril (PRINIVIL,ZESTRIL) 40 MG tablet Take 40 mg by mouth daily.    [provider]  metFORMIN (GLUCOPHAGE) 1000 MG tablet Take 1,000 mg by mouth every evening.    [provider]  methocarbamol (ROBAXIN) 500 MG tablet Take 1 tablet (500 mg total) by mouth every 6 (six) hours as needed for muscle spasms. 12/14/16   Evon Slack, PA-C  metoprolol succinate (TOPROL-XL) 25 MG 24 hr tablet  Take 25 mg by mouth daily.     [provider]  Omega-3 Fatty Acids (FISH OIL) 1000 MG CAPS Take 1 capsule by mouth daily.     [provider]  ondansetron (ZOFRAN) 4 MG tablet Take 1 tablet (4 mg total) by mouth every 6 (six) hours as needed for nausea. 12/14/16   Evon Slack, PA-C  pregabalin (LYRICA) 50 MG capsule Take 1 capsule (50 mg total) by mouth 3 (three) times daily. 12/18/16   Lorenso Quarry, NP  ranitidine (ZANTAC) 300 MG capsule Take 300 mg by mouth every evening.    [provider]  simvastatin (ZOCOR) 40 MG tablet Take 40 mg by mouth daily at  6 PM.    [provider]  triamcinolone cream (KENALOG) 0.1 % Apply 1 application topically daily as needed. apply to feet    [provider]  vitamin B-12 (CYANOCOBALAMIN) 1000 MCG tablet Take 1,000 mcg by mouth daily.    [provider]    Allergies Morphine and related  Family History  Problem Relation Age of Onset  . Breast cancer Mother 29  . Diabetes Mother   . Stroke Mother   . Stroke Brother     Social History Social History  Substance Use Topics  . Smoking status: Former Smoker    Packs/day: 0.25    Years: 10.00    Types: Cigarettes    Quit date: 08/18/1990  . Smokeless tobacco: Never Used  . Alcohol use No    Review of Systems Constitutional: No fever/chills Eyes: No visual changes. ENT: No sore throat. Cardiovascular: Denies chest pain. Respiratory: Denies shortness of breath. Gastrointestinal: No abdominal pain.  No nausea, no vomiting.  No diarrhea.  No constipation. Genitourinary: Negative for dysuria. Musculoskeletal: pain in right hip and groin radiating down leg. Negative for neck pain.  Negative for back pain. Integumentary: Negative for rash. Neurological: Negative for headaches, focal weakness or numbness. questionable weakness possibly associated with pain of the right leg   ____________________________________________   PHYSICAL  EXAM:  VITAL SIGNS: ED Triage Vitals  Enc Vitals Group     BP 12/29/16 2017 132/61     Pulse Rate 12/29/16 2017 87     Resp 12/29/16 2017 14     Temp 12/29/16 2017 98.5 F (36.9 C)     Temp Source 12/29/16 2017 Oral     SpO2 12/29/16 2017 98 %     Weight 12/29/16 2018 98 kg (216 lb)     Height 12/29/16 2018 1.702 m ( )     Head Circumference --      Peak Flow --      Pain Score 12/29/16 2017 9     Pain Loc --      Pain Edu? --      Excl. in GC? --     Constitutional: Alert and oriented. Well appearing and in no acute distress. Eyes: Conjunctivae are normal.  Head: Atraumatic. Cardiovascular: Normal rate, regular rhythm. Good peripheral circulation. Grossly normal heart sounds. Respiratory: Normal respiratory effort.  No retractions. Lungs CTAB. Gastrointestinal: Soft and nontender. No distention.  Musculoskeletal: No lower extremity tenderness nor edema. No gross deformities of extremities. she has no tenderness to palpation of the right hip even with pressing firmly on the pelvis. Neurologic:  Normal speech and language. No gross focal neurologic deficits are appreciated.  he has normal and equal lower extremity strength with flexion and extension of the hip, flexion and extension of the knees, and dorsiflexion and plantar flexion of both feet. Skin:  Skin is warm, dry and intact. No rash noted. Psychiatric: Mood and affect are normal. Speech and behavior are normal.  ____________________________________________   LABS (all labs ordered are listed, but only abnormal results are displayed)  Labs Reviewed - No data to display ____________________________________________  EKG  None - EKG not ordered by ED physician ____________________________________________  RADIOLOGY   US Venous Img Lower Unilateral Right  Result Date: 12/29/2016 CLINICAL DATA:  Right lower extremity pain for 1 day EXAM: Right LOWER EXTREMITY VENOUS DOPPLER ULTRASOUND TECHNIQUE: Gray-scale  sonography with graded compression, as well as color Doppler and duplex ultrasound were performed to evaluate the lower extremity deep venous  systems from the level of the common femoral vein and including the common femoral, femoral, profunda femoral, popliteal and calf veins including the posterior tibial, peroneal and gastrocnemius veins when visible. The superficial great saphenous vein was also interrogated. Spectral Doppler was utilized to evaluate flow at rest and with distal augmentation maneuvers in the common femoral, femoral and popliteal veins. COMPARISON:  None. FINDINGS: Contralateral Common Femoral Vein: Respiratory phasicity is normal and symmetric with the symptomatic side. No evidence of thrombus. Normal compressibility. Common Femoral Vein: No evidence of thrombus. Normal compressibility, respiratory phasicity and response to augmentation. Saphenofemoral Junction: No evidence of thrombus. Normal compressibility and flow on color Doppler imaging. Profunda Femoral Vein: No evidence of thrombus. Normal compressibility and flow on color Doppler imaging. Femoral Vein: No evidence of thrombus. Normal compressibility, respiratory phasicity and response to augmentation. Popliteal Vein: No evidence of thrombus. Normal compressibility, respiratory phasicity and response to augmentation. Calf Veins: No evidence of thrombus. Normal compressibility and flow on color Doppler imaging. Other Findings:  None. IMPRESSION: No evidence of DVT within the right lower extremity. Electronically Signed   By: Jasmine Pang M.D.   On: 12/29/2016 23:18    ____________________________________________   PROCEDURES  Critical Care performed: No   Procedure(s) performed:   Procedures   ____________________________________________   INITIAL IMPRESSION / ASSESSMENT AND PLAN / ED COURSE  Pertinent labs & imaging results that were available during my care of the patient were reviewed by me and considered in my  medical decision making (see chart for details).   reviewed the patient's recent discharge summary from her hip surgery.  I reviewed the radiologist's report from her ultrasound.  Differential diagnosis included DVT, but that is not present.  Based on her evaluation I think most likely she is suffering from musculoskeletal strain likely from the physical therapy she has been doing.  She has no appreciable focal neurological deficits at this time and there is nothing to suggest she has had a CVA.  I discussed the results with her and she is comfortable with the plan to follow up with Dr. Trilby Drummer on Monday.   I gave my usual and customary return precautions.         ____________________________________________  FINAL CLINICAL IMPRESSION(S) / ED DIAGNOSES  Final diagnoses:  Musculoskeletal pain of right lower extremity     MEDICATIONS GIVEN DURING THIS VISIT:  Medications - No data to display   NEW OUTPATIENT MEDICATIONS STARTED DURING THIS VISIT:  New Prescriptions   No medications on file    Modified Medications   No medications on file    Discontinued Medications   No medications on file     Note:  This document was prepared using Dragon voice recognition software and may include unintentional dictation errors.    Loleta Rose, MD 12/30/16 0201

## 2016-12-30 NOTE — ED Notes (Signed)
Reviewed d/c instructions, follow-up care, use of ice/heat with patient. Pt verbalized understanding.

## 2016-12-31 ENCOUNTER — Emergency Department: Payer: Medicare HMO

## 2016-12-31 ENCOUNTER — Emergency Department
Admission: EM | Admit: 2016-12-31 | Discharge: 2016-12-31 | Disposition: A | Discharge status: Home / Self Care | Payer: Medicare HMO | Attending: Emergency Medicine | Admitting: Emergency Medicine

## 2016-12-31 ENCOUNTER — Encounter: Payer: Self-pay | Admitting: Emergency Medicine

## 2016-12-31 DIAGNOSIS — N183 Chronic kidney disease, stage 3 (moderate): Secondary | ICD-10-CM | POA: Diagnosis not present

## 2016-12-31 DIAGNOSIS — I2510 Atherosclerotic heart disease of native coronary artery without angina pectoris: Secondary | ICD-10-CM | POA: Diagnosis not present

## 2016-12-31 DIAGNOSIS — E1122 Type 2 diabetes mellitus with diabetic chronic kidney disease: Secondary | ICD-10-CM | POA: Diagnosis not present

## 2016-12-31 DIAGNOSIS — Z87891 Personal history of nicotine dependence: Secondary | ICD-10-CM | POA: Diagnosis not present

## 2016-12-31 DIAGNOSIS — I129 Hypertensive chronic kidney disease with stage 1 through stage 4 chronic kidney disease, or unspecified chronic kidney disease: Secondary | ICD-10-CM | POA: Insufficient documentation

## 2016-12-31 DIAGNOSIS — Z794 Long term (current) use of insulin: Secondary | ICD-10-CM | POA: Insufficient documentation

## 2016-12-31 DIAGNOSIS — M25561 Pain in right knee: Secondary | ICD-10-CM | POA: Insufficient documentation

## 2016-12-31 DIAGNOSIS — Z79899 Other long term (current) drug therapy: Secondary | ICD-10-CM | POA: Insufficient documentation

## 2016-12-31 MED ORDER — DICLOFENAC SODIUM 1 % TD GEL: 4.0000 g | Freq: Four times a day (QID) | TRANSDERMAL | 1 refills | Status: AC

## 2016-12-31 NOTE — ED Provider Notes (Signed)
[facility] Emergency Department Provider Note  ____________________________________________  Time seen: Approximately 6:25 PM  I have reviewed the triage vital signs and the nursing notes.   HISTORY  Chief Complaint Knee Pain    HPI Sierra Guzman is a 76 y.o. female presenting to the emergency department with right knee pain. Patient reports that she had a right total hip replacement approximately 2 weeks ago. Patient states that her pain has been managed with Tylenol alone. Patient reports that she experienced no right knee pain prior to right hip replacement. Patient describes her pain as "kind of tingling". Patient had ultrasound conducted of the right lower extremity one day ago which was negative for thromboembolism. She denies weakness and states that her pain is improved with sitting. Patient states that she experiences frequent emesis secondary to narcotic pain medications and would like to avoid narcotics at all cost. She states that she is looking for conservative management options to improve right knee pain.   Past Medical History:  Diagnosis Date  . Anemia 10/28/2010   unspecified  . Anxiety   . Aortic valve sclerosis   . Arthritis   . Asthma   . Chronic kidney disease    Chronic Kidney Disease---Stage III  . Coronary artery disease 10/28/2010  . DDD (degenerative disc disease), lumbar 2006  . Diabetes mellitus without complication (HCC)   . Diabetes type 2, controlled (HCC) 10/28/2010  . Dyspnea   . GERD (gastroesophageal reflux disease)   . Gout 10/28/2010  . Heart abnormality   . Heart murmur   . History of cardiac catheterization 2006   LAD 50%, D1 50% rest normal.   . History of cardiovascular stress test 09/23/2009    lexiscan Normal Cardiolite test, EF 61%. LV size and function - normal  . History of echocardiogram 05/27/2008   EF 55%; Mild aortic insufficiency with minimal aortic sclerosis. L vent normal.   . Hyperlipidemia   .  Hyperlipidemia LDL goal <100 10/28/2010   managed by Dr. Burr Medico  . Hypertension 10/28/2010  . Interstitial pneumonia (HCC)    unspecified  . Mild vitamin D deficiency 02/22/2011  . Obesity (BMI 30-39.9) 10/28/2010   unspecified  . 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 Personal history of colonic polyps 10/28/2010  . Shortness of breath   . Syncope 2013  . Vitamin B 12 deficiency 10/28/2010    Patient Active Problem List   Diagnosis Date Noted  . Primary localized osteoarthritis of right hip 12/12/2016    Past Surgical History:  Procedure Laterality Date  . BACK SURGERY  2006   DDD  . BREAST BIOPSY Right    neg  . CARDIAC CATHETERIZATION  2006  . EYE SURGERY Bilateral    Cataract Extraction with IOL  . JOINT REPLACEMENT Left    Left Total Hip Replacement  . JOINT REPLACEMENT     RIGHT 2005; LEFT 2 X 2004, 2011  . REPLACEMENT TOTAL KNEE Left   . REVISION TOTAL KNEE ARTHROPLASTY Left    Dr. Ernest Pine  . TONSILLECTOMY    . TOTAL HIP ARTHROPLASTY Right 12/12/2016   Procedure: TOTAL HIP ARTHROPLASTY ANTERIOR APPROACH;  Surgeon: Kennedy Bucker, MD;  Location: [facility] ORS;  Service: Orthopedics;  Laterality: Right;  . TOTAL HIP ARTHROPLASTY Left 12/26/2013   Procedure: ARTHROPLASTY HIP TOTAL; Surgeon: Christell Faith, MD; Location: [facility] OR; Service: Orthopedics; Laterality: left,   . TOTAL KNEE ARTHROPLASTY     both knees    Prior  to Admission medications   Medication Sig Start Date End Date Taking? Authorizing Provider  acetaminophen (TYLENOL) 500 MG tablet Take 2 tablets (1,000 mg total) by mouth every 6 (six) hours as needed for mild pain (or Fever >/= 101). 12/14/16   Evon Slack, PA-C  albuterol (VENTOLIN HFA) 108 (90 Base) MCG/ACT inhaler Inhale 1-2 puffs into the lungs every 6 (six) hours as needed for wheezing or shortness of breath.    [provider]  allopurinol (ZYLOPRIM) 300 MG tablet Take 300 mg by mouth daily.     [provider]  aspirin EC 81 MG tablet Take 81 mg by mouth daily.    [provider]  bisacodyl (DULCOLAX) 10 MG suppository Place 1 suppository (10 mg total) rectally daily as needed for moderate constipation. 12/14/16   Evon Slack, PA-C  cetirizine (ZYRTEC) 10 MG tablet Take 10 mg by mouth at bedtime.    [provider]  cholecalciferol (VITAMIN D) 1000 units tablet Take 1,000 Units by mouth daily.    [provider]  Cinnamon 500 MG capsule Take 500 mg by mouth daily.    [provider]  diclofenac sodium (VOLTAREN) 1 % GEL Apply 4 g topically 4 (four) times daily. 12/31/16 01/07/17  Orvil Feil, PA-C  diphenhydrAMINE (BENADRYL) 25 mg capsule Take 25 mg by mouth as needed for itching.    [provider]  docusate sodium (COLACE) 100 MG capsule Take 1 capsule (100 mg total) by mouth 2 (two) times daily. 12/14/16   Evon Slack, PA-C  enoxaparin (LOVENOX) 40 MG/0.4ML injection Inject 0.4 mLs (40 mg total) into the skin daily. 12/15/16 12/29/16  Evon Slack, PA-C  Fluticasone-Salmeterol (ADVAIR) 500-50 MCG/DOSE AEPB Inhale 1 puff into the lungs 2 (two) times daily.     [provider]  guaiFENesin (ROBITUSSIN) 100 MG/5ML liquid Take 200 mg by mouth every 4 (four) hours as needed for cough. Notify provider if cough worsens/ continues longer than 3 days    [provider]  hydrochlorothiazide (HYDRODIURIL) 25 MG tablet Take 25 mg by mouth daily.    [provider]  hydrOXYzine (ATARAX/VISTARIL) 10 MG tablet Take 10 mg by mouth every 4 (four) hours as needed for itching.    [provider]  lisinopril (PRINIVIL,ZESTRIL) 40 MG tablet Take 40 mg by mouth daily.    [provider]  metFORMIN (GLUCOPHAGE) 1000 MG tablet Take 1,000 mg by mouth every evening.    [provider]  methocarbamol (ROBAXIN) 500 MG tablet Take 1 tablet (500 mg total) by mouth every 6 (six) hours as needed for muscle spasms.  12/14/16   Evon Slack, PA-C  metoprolol succinate (TOPROL-XL) 25 MG 24 hr tablet Take 25 mg by mouth daily.     [provider]  Omega-3 Fatty Acids (FISH OIL) 1000 MG CAPS Take 1 capsule by mouth daily.     [provider]  ondansetron (ZOFRAN) 4 MG tablet Take 1 tablet (4 mg total) by mouth every 6 (six) hours as needed for nausea. 12/14/16   Evon Slack, PA-C  pregabalin (LYRICA) 50 MG capsule Take 1 capsule (50 mg total) by mouth 3 (three) times daily. 12/18/16   Lorenso Quarry, NP  ranitidine (ZANTAC) 300 MG capsule Take 300 mg by mouth every evening.    [provider]  simvastatin (ZOCOR) 40 MG tablet Take 40 mg by mouth daily at 6 PM.    [provider]  triamcinolone  cream (KENALOG) 0.1 % Apply 1 application topically daily as needed. apply to feet    [provider]  vitamin B-12 (CYANOCOBALAMIN) 1000 MCG tablet Take 1,000 mcg by mouth daily.    [provider]    Allergies Morphine and related  Family History  Problem Relation Age of Onset  . Breast cancer Mother 42  . Diabetes Mother   . Stroke Mother   . Stroke Brother     Social History Social History  Substance Use Topics  . Smoking status: Former Smoker    Packs/day: 0.25    Years: 10.00    Types: Cigarettes    Quit date: 08/18/1990  . Smokeless tobacco: Never Used  . Alcohol use No     Review of Systems  Constitutional: No fever/chills Eyes: No visual changes. No discharge ENT: No upper respiratory complaints. Cardiovascular: no chest pain. Respiratory: no cough. No SOB. Gastrointestinal: No abdominal pain.  No nausea, no vomiting.  No diarrhea.  No constipation. Musculoskeletal: Patient has right knee pain.  Skin: Negative for rash, abrasions, lacerations, ecchymosis. Neurological: Negative for headaches, focal weakness or numbness.   ____________________________________________   PHYSICAL EXAM:  VITAL SIGNS: ED Triage Vitals  Enc Vitals  Group     BP 12/31/16 1636 124/62     Pulse Rate 12/31/16 1636 86     Resp 12/31/16 1636 18     Temp 12/31/16 1636 98.2 F (36.8 C)     Temp Source 12/31/16 1636 Oral     SpO2 12/31/16 1636 100 %     Weight 12/31/16 1636 216 lb (98 kg)     Height 12/31/16 1636  (1.702 m)     Head Circumference --      Peak Flow --      Pain Score 12/31/16 1639 8     Pain Loc --      Pain Edu? --      Excl. in GC? --      Constitutional: Alert and oriented. Well appearing and in no acute distress. Eyes: Conjunctivae are normal. PERRL. EOMI. Head: Atraumatic. Cardiovascular: Normal rate, regular rhythm. Normal S1 and S2.  Good peripheral circulation. Respiratory: Normal respiratory effort without tachypnea or retractions. Lungs CTAB. Good air entry to the bases with no decreased or absent breath sounds. Musculoskeletal: Right knee: No laxity with MCL or LCL testing. No popliteal fullness. Palpable dorsalis pedis pulses, right. Neurologic:  Normal speech and language. No gross focal neurologic deficits are appreciated.  Skin:  Skin is warm, dry and intact. No rash noted. Psychiatric: Mood and affect are normal. Speech and behavior are normal. Patient exhibits appropriate insight and judgement.   ____________________________________________   LABS (all labs ordered are listed, but only abnormal results are displayed)  Labs Reviewed - No data to display ____________________________________________  EKG   ____________________________________________  RADIOLOGY Geraldo Pitter, personally viewed and evaluated these images (plain radiographs) as part of my medical decision making, as well as reviewing the written report by the radiologist.  Dg Knee Complete 4 Views Right  Result Date: 12/31/2016 CLINICAL DATA:  Knee pain and swelling EXAM: RIGHT KNEE - COMPLETE 4+ VIEW COMPARISON:  None. FINDINGS: Total knee replacement in satisfactory position alignment. Negative for fracture. No  effusion. Atherosclerotic calcification popliteal artery. IMPRESSION: Satisfactory knee replacement.  No acute abnormality. Electronically Signed   By: Marlan Palau M.D.   On: 12/31/2016 17:18    ____________________________________________    PROCEDURES  Procedure(s) performed:  Procedures    Medications - No data to display   ____________________________________________   INITIAL IMPRESSION / ASSESSMENT AND PLAN / ED COURSE  Pertinent labs & imaging results that were available during my care of the patient were reviewed by me and considered in my medical decision making (see chart for details).  Review of the [place] CSRS was performed in accordance of the [facility] prior to dispensing any controlled drugs.     Assessment and plan Right knee pain Patient presents to the emergency department with right knee pain status post right hip replacement. Patient is looking for a conservative pain management option for her right knee pain. Patient was discharged with diclofenac gel and advised to follow-up with her orthopedist, Dr. Rosita Kea. Vital signs are reassuring prior to discharge. All patients questions were answered. ____________________________________________  FINAL CLINICAL IMPRESSION(S) / ED DIAGNOSES  Final diagnoses:  Acute pain of right knee      NEW MEDICATIONS STARTED DURING THIS VISIT:  New Prescriptions   DICLOFENAC SODIUM (VOLTAREN) 1 % GEL    Apply 4 g topically 4 (four) times daily.        This chart was dictated using voice recognition software/Dragon. Despite best efforts to proofread, errors can occur which can change the meaning. Any change was purely unintentional.    Gasper Lloyd 12/31/16 2339    Sharman Cheek, MD 01/04/17 [phone]

## 2016-12-31 NOTE — ED Triage Notes (Signed)
Patient presents to ED via POV from home with c/o right knee pain. Patient was seen here 2 days ago and had a negative [place]. Patient denies injury or trauma.

## 2016-12-31 NOTE — ED Notes (Signed)
Pt c/o right knee pain, states she was here Friday for the same, but was not XR'd.  Pt had right hip replacement on 9/4 and has not had any hip pain, but knee pain is gradually become worse over the past few weeks. Pt taking Tylenol to help ease some of the pain, but states the pain is still bad.  Pt took Tylenol about an hour prior to arrival.

## 2017-01-05 DIAGNOSIS — Z96641 Presence of right artificial hip joint: Secondary | ICD-10-CM | POA: Insufficient documentation

## 2017-01-30 ENCOUNTER — Other Ambulatory Visit: Payer: Self-pay | Admitting: Family Medicine

## 2017-01-30 DIAGNOSIS — Z1231 Encounter for screening mammogram for malignant neoplasm of breast: Secondary | ICD-10-CM

## 2017-02-21 ENCOUNTER — Ambulatory Visit: Payer: Medicare HMO

## 2017-03-15 ENCOUNTER — Inpatient Hospital Stay: Admission: RE | Admit: 2017-03-15 | Payer: Medicare HMO | Source: Ambulatory Visit

## 2017-04-16 ENCOUNTER — Ambulatory Visit
Admission: RE | Admit: 2017-04-16 | Discharge: 2017-04-16 | Disposition: A | Discharge status: Home / Self Care | Payer: Medicare HMO | Source: Ambulatory Visit | Attending: Family Medicine | Admitting: Family Medicine

## 2017-04-16 DIAGNOSIS — Z1231 Encounter for screening mammogram for malignant neoplasm of breast: Secondary | ICD-10-CM | POA: Diagnosis present

## 2018-05-13 ENCOUNTER — Encounter: Payer: Self-pay | Admitting: Emergency Medicine

## 2018-05-13 ENCOUNTER — Ambulatory Visit
Admission: EM | Admit: 2018-05-13 | Discharge: 2018-05-13 | Disposition: A | Discharge status: Home / Self Care | Payer: Medicare HMO | Attending: Family Medicine | Admitting: Family Medicine

## 2018-05-13 ENCOUNTER — Other Ambulatory Visit: Payer: Self-pay

## 2018-05-13 DIAGNOSIS — R05 Cough: Secondary | ICD-10-CM | POA: Diagnosis not present

## 2018-05-13 DIAGNOSIS — R0981 Nasal congestion: Secondary | ICD-10-CM | POA: Diagnosis not present

## 2018-05-13 DIAGNOSIS — R69 Illness, unspecified: Principal | ICD-10-CM

## 2018-05-13 DIAGNOSIS — J111 Influenza due to unidentified influenza virus with other respiratory manifestations: Secondary | ICD-10-CM

## 2018-05-13 MED ORDER — OSELTAMIVIR PHOSPHATE 30 MG PO CAPS: 30.0000 mg | ORAL_CAPSULE | Freq: Two times a day (BID) | ORAL | 0 refills | Status: DC

## 2018-05-13 NOTE — ED Triage Notes (Signed)
Patient in today c/o cough and runny nose yesterday and head pressure this morning and funny feeling in her stomach. Patient denies emesis or fever.

## 2018-05-13 NOTE — ED Provider Notes (Signed)
[facility]    CSN: [id number] Arrival date & time: 05/13/18  0805     History   Chief Complaint Chief Complaint  Patient presents with  . Cough  . Nasal Congestion    HPI Sierra Guzman is a 78 y.o. female.   The history is provided by the patient.  URI  Presenting symptoms: congestion, cough and rhinorrhea   Severity:  Moderate Onset quality:  Sudden Duration:  1 day Timing:  Constant Progression:  Unchanged Chronicity:  New Relieved by:  None tried Associated symptoms: myalgias   Risk factors: sick contacts (exposure to flu (daughter and granddaughter))     Past Medical History:  Diagnosis Date  . Anemia 10/28/2010   unspecified  . Anxiety   . Aortic valve sclerosis   . Arthritis   . Asthma   . Chronic kidney disease    Chronic Kidney Disease---Stage III  . Coronary artery disease 10/28/2010  . DDD (degenerative disc disease), lumbar 2006  . Diabetes mellitus without complication (HCC)   . Diabetes type 2, controlled (HCC) 10/28/2010  . Dyspnea   . GERD (gastroesophageal reflux disease)   . Gout 10/28/2010  . Heart abnormality   . Heart murmur   . History of cardiac catheterization 2006   LAD 50%, D1 50% rest normal.   . History of cardiovascular stress test 09/23/2009    lexiscan Normal Cardiolite test, EF 61%. LV size and function - normal  . History of echocardiogram 05/27/2008   EF 55%; Mild aortic insufficiency with minimal aortic sclerosis. L vent normal.   . Hyperlipidemia   . Hyperlipidemia LDL goal <100 10/28/2010   managed by Dr. Burr Medico  . Hypertension 10/28/2010  . Interstitial pneumonia (HCC)    unspecified  . Mild vitamin D deficiency 02/22/2011  . Obesity (BMI 30-39.9) 10/28/2010   unspecified  . 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 Personal history of colonic polyps 10/28/2010  . Shortness of breath   . Syncope 2013  . Vitamin B 12 deficiency 10/28/2010    Patient Active Problem List   Diagnosis Date Noted  . Primary localized osteoarthritis of right hip 12/12/2016    Past Surgical History:  Procedure Laterality Date  . BACK SURGERY  2006   DDD  . BREAST BIOPSY Right    neg  . CARDIAC CATHETERIZATION  2006  . EYE SURGERY Bilateral    Cataract Extraction with IOL  . JOINT REPLACEMENT Left    Left Total Hip Replacement  . JOINT REPLACEMENT     RIGHT 2005; LEFT 2 X 2004, 2011  . REPLACEMENT TOTAL KNEE Left   . REVISION TOTAL KNEE ARTHROPLASTY Left    Dr. Ernest Pine  . TONSILLECTOMY    . TOTAL HIP ARTHROPLASTY Right 12/12/2016   Procedure: TOTAL HIP ARTHROPLASTY ANTERIOR APPROACH;  Surgeon: Kennedy Bucker, MD;  Location: [facility] ORS;  Service: Orthopedics;  Laterality: Right;  . TOTAL HIP ARTHROPLASTY Left 12/26/2013   Procedure: ARTHROPLASTY HIP TOTAL; Surgeon: Christell Faith, MD; Location: [facility] OR; Service: Orthopedics; Laterality: left,   . TOTAL KNEE ARTHROPLASTY     both knees    OB History    Gravida  5   Para  5   Term      Preterm      AB      Living  5     SAB      TAB      Ectopic      Multiple  Live Births               Home Medications    Prior to Admission medications   Medication Sig Start Date End Date Taking? Authorizing Provider  albuterol (VENTOLIN HFA) 108 (90 Base) MCG/ACT inhaler Inhale 1-2 puffs into the lungs every 6 (six) hours as needed for wheezing or shortness of breath.   Yes [provider]  allopurinol (ZYLOPRIM) 300 MG tablet Take 300 mg by mouth daily.    Yes [provider]  aspirin EC 81 MG tablet Take 81 mg by mouth daily.   Yes [provider]  bisacodyl (DULCOLAX) 10 MG suppository Place 1 suppository (10 mg total) rectally daily as needed for moderate constipation. 12/14/16  Yes Evon Slack, PA-C  cetirizine (ZYRTEC) 10 MG tablet Take 10 mg by mouth at bedtime.   Yes [provider]  cholecalciferol (VITAMIN D) 1000 units tablet Take 1,000 Units by mouth  daily.   Yes [provider]  Cinnamon 500 MG capsule Take 500 mg by mouth daily.   Yes [provider]  diphenhydrAMINE (BENADRYL) 25 mg capsule Take 25 mg by mouth as needed for itching.   Yes [provider]  Fluticasone-Salmeterol (ADVAIR) 500-50 MCG/DOSE AEPB Inhale 1 puff into the lungs 2 (two) times daily.    Yes [provider]  hydrochlorothiazide (HYDRODIURIL) 25 MG tablet Take 25 mg by mouth daily.   Yes [provider]  lisinopril (PRINIVIL,ZESTRIL) 40 MG tablet Take 40 mg by mouth daily.   Yes [provider]  metFORMIN (GLUCOPHAGE) 1000 MG tablet Take 1,000 mg by mouth every evening.   Yes [provider]  metoprolol succinate (TOPROL-XL) 25 MG 24 hr tablet Take 25 mg by mouth daily.    Yes [provider]  Omega-3 Fatty Acids (FISH OIL) 1000 MG CAPS Take 1 capsule by mouth daily.    Yes [provider]  simvastatin (ZOCOR) 40 MG tablet Take 40 mg by mouth daily at 6 PM.   Yes [provider]  vitamin B-12 (CYANOCOBALAMIN) 1000 MCG tablet Take 1,000 mcg by mouth daily.   Yes [provider]  acetaminophen (TYLENOL) 500 MG tablet Take 2 tablets (1,000 mg total) by mouth every 6 (six) hours as needed for mild pain (or Fever >/= 101). 12/14/16   Evon Slack, PA-C  docusate sodium (COLACE) 100 MG capsule Take 1 capsule (100 mg total) by mouth 2 (two) times daily. 12/14/16   Evon Slack, PA-C  enoxaparin (LOVENOX) 40 MG/0.4ML injection Inject 0.4 mLs (40 mg total) into the skin daily. 12/15/16 12/29/16  Evon Slack, PA-C  gabapentin (NEURONTIN) 300 MG capsule TAKE 2 CAPSULES BY MOUTH TWICE A DAY AND TAKE 3 CAPS AT BEDTIME 03/02/18   [provider]  guaiFENesin (ROBITUSSIN) 100 MG/5ML liquid Take 200 mg by mouth every 4 (four) hours as needed for cough. Notify provider if cough worsens/ continues longer than 3 days    [provider]  hydrOXYzine (ATARAX/VISTARIL) 10  MG tablet Take 10 mg by mouth every 4 (four) hours as needed for itching.    [provider]  methocarbamol (ROBAXIN) 500 MG tablet Take 1 tablet (500 mg total) by mouth every 6 (six) hours as needed for muscle spasms. 12/14/16   Evon Slack, PA-C  ondansetron (ZOFRAN) 4 MG tablet Take 1 tablet (4 mg total) by mouth every 6 (six) hours as needed for nausea. 12/14/16   Evon Slack, PA-C  oseltamivir (TAMIFLU) 30 MG capsule Take 1 capsule (30 mg total) by mouth 2 (two) times daily. 05/13/18   Payton Mccallumonty, Justice Milliron, MD  pregabalin (LYRICA) 50 MG capsule Take 1 capsule (50 mg total) by mouth 3 (three) times daily. 12/18/16   Lorenso QuarryLeach, Shannon, NP  ranitidine (ZANTAC) 300 MG capsule Take 300 mg by mouth every evening.    [provider]  triamcinolone cream (KENALOG) 0.1 % Apply 1 application topically daily as needed. apply to feet    [provider]    Family History Family History  Problem Relation Age of Onset  . Breast cancer Mother 5784  . Diabetes Mother   . Stroke Mother   . Stroke Brother   . Breast cancer Cousin        pat cousin    Social History Social History   Tobacco Use  . Smoking status: Former Smoker    Packs/day: 0.25    Years: 10.00    Pack years: 2.50    Types: Cigarettes    Last attempt to quit: 08/18/1990    Years since quitting: 27.7  . Smokeless tobacco: Never Used  Substance Use Topics  . Alcohol use: No  . Drug use: No     Allergies   Morphine and related   Review of Systems Review of Systems  HENT: Positive for congestion and rhinorrhea.   Respiratory: Positive for cough.   Musculoskeletal: Positive for myalgias.     Physical Exam Triage Vital Signs ED Triage Vitals  Enc Vitals Group     BP 05/13/18 0819 (!) 153/71     Pulse Rate 05/13/18 0819 64     Resp 05/13/18 0819 16     Temp 05/13/18 0819 99 F (37.2 C)     Temp Source 05/13/18 0819 Oral     SpO2 05/13/18 0819 98 %     Weight 05/13/18 0820 202 lb (91.6 kg)      Height 05/13/18 0820 5\' 7"  (1.702 m)     Head Circumference --      Peak Flow --      Pain Score 05/13/18 0820 0     Pain Loc --      Pain Edu? --      Excl. in GC? --    No data found.  Updated Vital Signs BP (!) 153/71 (BP Location: Left Arm)   Pulse 64   Temp 99 F (37.2 C) (Oral)   Resp 16   Ht 5\' 7"  (1.702 m)   Wt 91.6 kg   SpO2 98%   BMI 31.64 kg/m   Visual Acuity Right Eye Distance:   Left Eye Distance:   Bilateral Distance:    Right Eye Near:   Left Eye Near:    Bilateral Near:     Physical Exam Vitals signs and nursing note reviewed.  Constitutional:      General: She is not in acute distress.    Appearance: She is well-developed. She is not toxic-appearing or diaphoretic.  HENT:     Head: Normocephalic and atraumatic.     Nose: Rhinorrhea present.     Mouth/Throat:     Pharynx: Uvula midline. No oropharyngeal exudate.  Eyes:     General: No scleral icterus.       Right eye: No discharge.        Left eye: No discharge.  Neck:     Musculoskeletal: Normal range of motion and neck supple.     Thyroid: No thyromegaly.  Cardiovascular:  Rate and Rhythm: Normal rate and regular rhythm.     Heart sounds: Normal heart sounds.  Pulmonary:     Effort: Pulmonary effort is normal. No respiratory distress.     Breath sounds: Normal breath sounds. No stridor. No wheezing, rhonchi or rales.  Lymphadenopathy:     Cervical: No cervical adenopathy.  Neurological:     Mental Status: She is alert.      UC Treatments / Results  Labs (all labs ordered are listed, but only abnormal results are displayed) Labs Reviewed - No data to display  EKG None  Radiology No results found.  Procedures Procedures (including critical care time)  Medications Ordered in UC Medications - No data to display  Initial Impression / Assessment and Plan / UC Course  I have reviewed the triage vital signs and the nursing notes.  Pertinent labs & imaging results that were  available during my care of the patient were reviewed by me and considered in my medical decision making (see chart for details).      Final Clinical Impressions(s) / UC Diagnoses   Final diagnoses:  Influenza-like illness    ED Prescriptions    Medication Sig Dispense Auth. Provider   oseltamivir (TAMIFLU) 30 MG capsule Take 1 capsule (30 mg total) by mouth 2 (two) times daily. 10 capsule Payton Mccallumonty, Addaleigh Nicholls, MD     1. Lab results and diagnosis reviewed with patient 2. rx as per orders above; reviewed possible side effects, interactions, risks and benefits  3. Recommend supportive treatment with rest, fluids, otc analgesics prn 4. Follow-up prn if symptoms worsen or don't improve  Controlled Substance Prescriptions [place] Controlled Substance Registry consulted? Not Applicable   Payton Mccallumonty, Shellie Rogoff, MD 05/13/18 1038

## 2018-06-03 ENCOUNTER — Other Ambulatory Visit: Payer: Self-pay | Admitting: Family Medicine

## 2018-06-03 DIAGNOSIS — Z1231 Encounter for screening mammogram for malignant neoplasm of breast: Secondary | ICD-10-CM

## 2018-06-06 ENCOUNTER — Ambulatory Visit: Payer: Medicare HMO

## 2018-06-10 ENCOUNTER — Other Ambulatory Visit: Payer: Self-pay | Admitting: Family Medicine

## 2018-06-10 DIAGNOSIS — E2839 Other primary ovarian failure: Secondary | ICD-10-CM

## 2018-06-19 ENCOUNTER — Inpatient Hospital Stay: Admission: RE | Admit: 2018-06-19 | Payer: Medicare HMO | Source: Ambulatory Visit

## 2019-03-25 ENCOUNTER — Ambulatory Visit (INDEPENDENT_AMBULATORY_CARE_PROVIDER_SITE_OTHER): Payer: Medicare HMO

## 2019-03-25 ENCOUNTER — Encounter: Payer: Self-pay | Admitting: Emergency Medicine

## 2019-03-25 ENCOUNTER — Other Ambulatory Visit: Payer: Self-pay

## 2019-03-25 ENCOUNTER — Ambulatory Visit
Admission: EM | Admit: 2019-03-25 | Discharge: 2019-03-25 | Disposition: A | Discharge status: Home / Self Care | Payer: Medicare HMO | Attending: Family Medicine | Admitting: Family Medicine

## 2019-03-25 DIAGNOSIS — J849 Interstitial pulmonary disease, unspecified: Secondary | ICD-10-CM | POA: Diagnosis not present

## 2019-03-25 DIAGNOSIS — R05 Cough: Secondary | ICD-10-CM | POA: Diagnosis not present

## 2019-03-25 DIAGNOSIS — R059 Cough, unspecified: Secondary | ICD-10-CM

## 2019-03-25 MED ORDER — PREDNISONE 50 MG PO TABS: ORAL_TABLET | ORAL | 0 refills | Status: DC

## 2019-03-25 MED ORDER — BENZONATATE 200 MG PO CAPS: 200.0000 mg | ORAL_CAPSULE | Freq: Three times a day (TID) | ORAL | 0 refills | Status: DC | PRN

## 2019-03-25 NOTE — ED Triage Notes (Signed)
Patient states 1 week ago she started coughing. She has had to use her nebulizer recently due to SOB. She states she has had decreased appetite.

## 2019-03-25 NOTE — ED Provider Notes (Signed)
[facility]    CSN: [id number] Arrival date & time: 03/25/19  0905  History   Chief Complaint Chief Complaint  Patient presents with  . Cough  . Shortness of Breath   HPI  78 year old female with a complicated past medical history including interstitial lung disease and asthma who is currently followed by pulmonology presents with cough and shortness of breath.  I have reviewed her prior pulmonology notes.  She has chronic shortness of breath particularly with exertion, although she is able to do exercises in a pool without any difficulty.  Patient is currently on Advair.  She uses albuterol infrequently.  She states that she has had to use it as of late due to her symptoms.  She reports a 1 week history of cough and worsening shortness of breath.  She also reports a decrease in appetite.  She is concerned that it may be from a new medication that she has been placed on by her pulmonologist.  No fever.  No known exposures to COVID-19.  She desires testing today.  Patient concerned about pneumonia.  She states that she had some improvement with albuterol.  No other medications or interventions tried.  No other complaints.  PMH, Surgical Hx, Family Hx, Social History reviewed and updated as below.  Past Medical History:  Diagnosis Date  . Anemia 10/28/2010   unspecified  . Anxiety   . Aortic valve sclerosis   . Arthritis   . Asthma   . Chronic kidney disease    Chronic Kidney Disease---Stage III  . Coronary artery disease 10/28/2010  . DDD (degenerative disc disease), lumbar 2006  . Diabetes mellitus without complication (HCC)   . Diabetes type 2, controlled (HCC) 10/28/2010  . Dyspnea   . GERD (gastroesophageal reflux disease)   . Gout 10/28/2010  . Heart abnormality   . Heart murmur   . History of cardiac catheterization 2006   LAD 50%, D1 50% rest normal.   . History of cardiovascular stress test 09/23/2009    lexiscan Normal Cardiolite test, EF 61%. LV size  and function - normal  . History of echocardiogram 05/27/2008   EF 55%; Mild aortic insufficiency with minimal aortic sclerosis. L vent normal.   . Hyperlipidemia   . Hyperlipidemia LDL goal <100 10/28/2010   managed by Dr. Burr Medico  . Hypertension 10/28/2010  . Interstitial pneumonia (HCC)    unspecified  . Mild vitamin D deficiency 02/22/2011  . Obesity (BMI 30-39.9) 10/28/2010   unspecified  . 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 Personal history of colonic polyps 10/28/2010  . Shortness of breath   . Syncope 2013  . Vitamin B 12 deficiency 10/28/2010    Patient Active Problem List   Diagnosis Date Noted  . Primary localized osteoarthritis of right hip 12/12/2016   Past Surgical History:  Procedure Laterality Date  . BACK SURGERY  2006   DDD  . BREAST BIOPSY Right    neg  . CARDIAC CATHETERIZATION  2006  . EYE SURGERY Bilateral    Cataract Extraction with IOL  . JOINT REPLACEMENT Left    Left Total Hip Replacement  . JOINT REPLACEMENT     RIGHT 2005; LEFT 2 X 2004, 2011  . REPLACEMENT TOTAL KNEE Left   . REVISION TOTAL KNEE ARTHROPLASTY Left    Dr. Ernest Pine  . TONSILLECTOMY    . TOTAL HIP ARTHROPLASTY Right 12/12/2016   Procedure: TOTAL HIP ARTHROPLASTY ANTERIOR APPROACH;  Surgeon: Kennedy Bucker, MD;  Location: [facility] ORS;  Service: Orthopedics;  Laterality: Right;  . TOTAL HIP ARTHROPLASTY Left 12/26/2013   Procedure: ARTHROPLASTY HIP TOTAL; Surgeon: Christell Faith, MD; Location: [facility] OR; Service: Orthopedics; Laterality: left,   . TOTAL KNEE ARTHROPLASTY     both knees   OB History    Gravida  5   Para  5   Term      Preterm      AB      Living  5     SAB      TAB      Ectopic      Multiple      Live Births               Home Medications    Prior to Admission medications   Medication Sig Start Date End Date Taking? Authorizing Provider  acetaminophen (TYLENOL) 500 MG tablet Take 2 tablets (1,000 mg total) by  mouth every 6 (six) hours as needed for mild pain (or Fever >/= 101). 12/14/16  Yes Amador Cunas C, PA-C  albuterol (VENTOLIN HFA) 108 (90 Base) MCG/ACT inhaler Inhale 1-2 puffs into the lungs every 6 (six) hours as needed for wheezing or shortness of breath.   Yes [provider]  allopurinol (ZYLOPRIM) 300 MG tablet Take 300 mg by mouth daily.    Yes [provider]  aspirin EC 81 MG tablet Take 81 mg by mouth daily.   Yes [provider]  bisacodyl (DULCOLAX) 10 MG suppository Place 1 suppository (10 mg total) rectally daily as needed for moderate constipation. 12/14/16  Yes Evon Slack, PA-C  candesartan (ATACAND) 32 MG tablet Take by mouth. 10/01/18  Yes [provider]  cetirizine (ZYRTEC) 10 MG tablet Take 10 mg by mouth at bedtime.   Yes [provider]  cholecalciferol (VITAMIN D) 1000 units tablet Take 1,000 Units by mouth daily.   Yes [provider]  Cinnamon 500 MG capsule Take 500 mg by mouth daily.   Yes [provider]  diphenhydrAMINE (BENADRYL) 25 mg capsule Take 25 mg by mouth as needed for itching.   Yes [provider]  docusate sodium (COLACE) 100 MG capsule Take 1 capsule (100 mg total) by mouth 2 (two) times daily. 12/14/16  Yes Evon Slack, PA-C  ESBRIET 267 MG TABS  03/19/19  Yes [provider]  Fluticasone-Salmeterol (ADVAIR) 500-50 MCG/DOSE AEPB Inhale 1 puff into the lungs 2 (two) times daily.    Yes [provider]  gabapentin (NEURONTIN) 300 MG capsule TAKE 2 CAPSULES BY MOUTH TWICE A DAY AND TAKE 3 CAPS AT BEDTIME 03/02/18  Yes [provider]  hydrochlorothiazide (HYDRODIURIL) 25 MG tablet Take 25 mg by mouth daily.   Yes [provider]  lisinopril (PRINIVIL,ZESTRIL) 40 MG tablet Take 40 mg by mouth daily.   Yes [provider]  metFORMIN (GLUCOPHAGE) 1000 MG tablet Take 1,000 mg by mouth every evening.   Yes [provider]  metoprolol  succinate (TOPROL-XL) 25 MG 24 hr tablet Take 25 mg by mouth daily.    Yes [provider]  Omega-3 Fatty Acids (FISH OIL) 1000 MG CAPS Take 1 capsule by mouth daily.    Yes [provider]  rosuvastatin (CRESTOR) 20 MG tablet Take 20 mg by mouth daily. 01/17/19  Yes [provider]  vitamin B-12 (CYANOCOBALAMIN) 1000 MCG tablet Take 1,000 mcg by mouth daily.   Yes [provider]  benzonatate (TESSALON) 200 MG capsule  Take 1 capsule (200 mg total) by mouth 3 (three) times daily as needed for cough. 03/25/19   Tommie Samsook, Ezri Landers G, DO  predniSONE (DELTASONE) 50 MG tablet 1 tablet daily x 5 days 03/25/19   Everlene Otherook, Agusta Hackenberg G, DO  enoxaparin (LOVENOX) 40 MG/0.4ML injection Inject 0.4 mLs (40 mg total) into the skin daily. 12/15/16 03/25/19  Evon SlackGaines, Thomas C, PA-C  pregabalin (LYRICA) 50 MG capsule Take 1 capsule (50 mg total) by mouth 3 (three) times daily. 12/18/16 03/25/19  Lorenso QuarryLeach, Shannon, NP  ranitidine (ZANTAC) 300 MG capsule Take 300 mg by mouth every evening.  03/25/19  [provider]  simvastatin (ZOCOR) 40 MG tablet Take 40 mg by mouth daily at 6 PM.  03/25/19  [provider]    Family History Family History  Problem Relation Age of Onset  . Breast cancer Mother 2484  . Diabetes Mother   . Stroke Mother   . Stroke Brother   . Breast cancer Cousin        pat cousin    Social History Social History   Tobacco Use  . Smoking status: Former Smoker    Packs/day: 0.25    Years: 10.00    Pack years: 2.50    Types: Cigarettes    Quit date: 08/18/1990    Years since quitting: 28.6  . Smokeless tobacco: Never Used  Substance Use Topics  . Alcohol use: No  . Drug use: No     Allergies   Morphine and related   Review of Systems Review of Systems  Constitutional: Negative for fever.  Respiratory: Positive for cough and shortness of breath.    Physical Exam Triage Vital Signs ED Triage Vitals  Enc Vitals Group     BP 03/25/19 0934  127/68     Pulse Rate 03/25/19 0934 97     Resp 03/25/19 0934 18     Temp 03/25/19 0934 98.6 F (37 C)     Temp Source 03/25/19 0934 Oral     SpO2 03/25/19 0934 98 %     Weight 03/25/19 0929 188 lb (85.3 kg)     Height 03/25/19 0929 5\' 7"  (1.702 m)     Head Circumference --      Peak Flow --      Pain Score 03/25/19 0928 0     Pain Loc --      Pain Edu? --      Excl. in GC? --    Updated Vital Signs BP 127/68 (BP Location: Right Arm)   Pulse 97   Temp 98.6 F (37 C) (Oral)   Resp 18   Ht 5\' 7"  (1.702 m)   Wt 85.3 kg   SpO2 98%   BMI 29.44 kg/m   Visual Acuity Right Eye Distance:   Left Eye Distance:   Bilateral Distance:    Right Eye Near:   Left Eye Near:    Bilateral Near:     Physical Exam Vitals and nursing note reviewed.  Constitutional:      General: She is not in acute distress.    Appearance: Normal appearance. She is not ill-appearing.  HENT:     Head: Normocephalic and atraumatic.  Eyes:     General:        Right eye: No discharge.        Left eye: No discharge.     Conjunctiva/sclera: Conjunctivae normal.  Cardiovascular:     Rate and Rhythm: Normal rate and regular rhythm.  Pulmonary:  Effort: Pulmonary effort is normal.     Comments: Diffuse crackles noted. Skin:    General: Skin is warm.     Findings: No rash.  Neurological:     General: No focal deficit present.     Mental Status: She is alert and oriented to person, place, and time.  Psychiatric:        Mood and Affect: Mood normal.        Behavior: Behavior normal.    UC Treatments / Results  Labs (all labs ordered are listed, but only abnormal results are displayed) Labs Reviewed  NOVEL CORONAVIRUS, NAA (HOSP ORDER, SEND-OUT TO REF LAB; TAT 18-24 HRS)    EKG   Radiology DG Chest 2 View  Result Date: 03/25/2019 CLINICAL DATA:  Cough EXAM: CHEST - 2 VIEW COMPARISON:  Aug 26, 2013 FINDINGS: The heart size and mediastinal contours are within normal limits. There are  coarsened interstitial markings seen throughout both lungs and a small area of honeycombing seen peripherally at the right upper lung. There has been interval progression since the prior exam. No large airspace consolidation or pleural effusion. No acute osseous findings. IMPRESSION: Interval progression of increased interstitial markings seen throughout both lungs. This could be due to infectious etiology such as atypical viral pneumonia or bronchitis, and/or progression of chronic interstitial lung changes. Electronically Signed   By: Prudencio Pair M.D.   On: 03/25/2019 10:21    Procedures Procedures (including critical care time)  Medications Ordered in UC Medications - No data to display  Initial Impression / Assessment and Plan / UC Course  I have reviewed the triage vital signs and the nursing notes.  Pertinent labs & imaging results that were available during my care of the patient were reviewed by me and considered in my medical decision making (see chart for details).    78 year old female presents with cough and shortness of breath.  Chest x-ray obtained and revealed increased interstitial markings.  I suspect that this is likely a due to progression of her interstitial lung disease as opposed to underlying acute process.  Awaiting COVID-19 test result.  Given ongoing cough and history of asthma, placing on prednisone.  Tessalon Perles for cough.  Supportive care.  Final Clinical Impressions(s) / UC Diagnoses   Final diagnoses:  Cough  ILD (interstitial lung disease) ([place])     Discharge Instructions     Medication as prescribed.  Call Pulmonary to follow up.  Take care  Dr. Lacinda Axon    ED Prescriptions    Medication Sig Dispense Auth. Provider   predniSONE (DELTASONE) 50 MG tablet 1 tablet daily x 5 days 5 tablet Christo Hain G, DO   benzonatate (TESSALON) 200 MG capsule Take 1 capsule (200 mg total) by mouth 3 (three) times daily as needed for cough. 30 capsule Coral Spikes, DO     PDMP not reviewed this encounter.   Coral Spikes, DO 03/25/19 1104

## 2019-03-25 NOTE — Discharge Instructions (Signed)
Medication as prescribed.  Call Pulmonary to follow up.  Take care  Dr. Lacinda Axon

## 2019-03-26 LAB — NOVEL CORONAVIRUS, NAA (HOSP ORDER, SEND-OUT TO REF LAB; TAT 18-24 HRS): SARS-CoV-2, NAA: NOT DETECTED

## 2019-07-15 ENCOUNTER — Other Ambulatory Visit: Payer: Self-pay | Admitting: Family Medicine

## 2019-07-15 DIAGNOSIS — Z1231 Encounter for screening mammogram for malignant neoplasm of breast: Secondary | ICD-10-CM

## 2019-07-29 ENCOUNTER — Other Ambulatory Visit: Payer: Self-pay

## 2019-07-29 ENCOUNTER — Ambulatory Visit
Admission: RE | Admit: 2019-07-29 | Discharge: 2019-07-29 | Disposition: A | Discharge status: Home / Self Care | Payer: Medicare HMO | Source: Ambulatory Visit | Attending: Family Medicine | Admitting: Family Medicine

## 2019-07-29 ENCOUNTER — Encounter (INDEPENDENT_AMBULATORY_CARE_PROVIDER_SITE_OTHER): Payer: Self-pay

## 2019-07-29 DIAGNOSIS — Z1231 Encounter for screening mammogram for malignant neoplasm of breast: Secondary | ICD-10-CM

## 2020-05-11 ENCOUNTER — Other Ambulatory Visit: Payer: Self-pay | Admitting: Family Medicine

## 2020-05-11 DIAGNOSIS — E2839 Other primary ovarian failure: Secondary | ICD-10-CM

## 2020-05-11 DIAGNOSIS — Z1231 Encounter for screening mammogram for malignant neoplasm of breast: Secondary | ICD-10-CM

## 2020-08-03 ENCOUNTER — Inpatient Hospital Stay: Admission: RE | Admit: 2020-08-03 | Payer: Medicare HMO | Source: Ambulatory Visit

## 2020-08-03 ENCOUNTER — Ambulatory Visit: Payer: Medicare HMO

## 2020-08-06 ENCOUNTER — Other Ambulatory Visit: Payer: Self-pay

## 2020-08-06 ENCOUNTER — Encounter: Payer: Medicare HMO | Attending: Critical Care Medicine | Admitting: *Deleted

## 2020-08-06 DIAGNOSIS — Q762 Congenital spondylolisthesis: Secondary | ICD-10-CM | POA: Insufficient documentation

## 2020-08-06 DIAGNOSIS — M4802 Spinal stenosis, cervical region: Secondary | ICD-10-CM | POA: Insufficient documentation

## 2020-08-06 DIAGNOSIS — J849 Interstitial pulmonary disease, unspecified: Secondary | ICD-10-CM

## 2020-08-06 DIAGNOSIS — M25559 Pain in unspecified hip: Secondary | ICD-10-CM | POA: Insufficient documentation

## 2020-08-06 NOTE — Progress Notes (Signed)
Initial telephone orientation completed. Diagnosis can be found in [facility] 3/18. EP orientation scheduled for Tuesday 5/3 at 8am.

## 2020-08-10 ENCOUNTER — Other Ambulatory Visit: Payer: Self-pay

## 2020-08-10 ENCOUNTER — Encounter: Payer: Medicare HMO | Attending: Critical Care Medicine | Admitting: *Deleted

## 2020-08-10 VITALS — Ht 65.4 in | Wt 155.9 lb

## 2020-08-10 DIAGNOSIS — J849 Interstitial pulmonary disease, unspecified: Secondary | ICD-10-CM | POA: Diagnosis not present

## 2020-08-10 NOTE — Progress Notes (Signed)
Pulmonary Individual Treatment Plan  Patient Details  Name: Sierra Guzman MRN: [id number] Date of Birth: 11-19-1940 Referring Provider:   Flowsheet Row Pulmonary Rehab from 08/10/2020 in [facility] Cardiac and Pulmonary Rehab  Referring Provider Josph Macho MD      Initial Encounter Date:  Flowsheet Row Pulmonary Rehab from 08/10/2020 in [facility] Cardiac and Pulmonary Rehab  Date 08/10/20      Visit Diagnosis: ILD (interstitial lung disease) (HCC)  Patient's Home Medications on Admission:  Current Outpatient Medications:  .  acetaminophen (TYLENOL) 500 MG tablet, Take 2 tablets (1,000 mg total) by mouth every 6 (six) hours as needed for mild pain (or Fever >/= 101)., Disp: 30 tablet, Rfl: 0 .  albuterol (VENTOLIN HFA) 108 (90 Base) MCG/ACT inhaler, Inhale 1-2 puffs into the lungs every 6 (six) hours as needed for wheezing or shortness of breath., Disp: , Rfl:  .  allopurinol (ZYLOPRIM) 300 MG tablet, Take 300 mg by mouth daily. , Disp: , Rfl:  .  aspirin EC 81 MG tablet, Take 81 mg by mouth daily., Disp: , Rfl:  .  benzonatate (TESSALON) 200 MG capsule, Take 1 capsule (200 mg total) by mouth 3 (three) times daily as needed for cough., Disp: 30 capsule, Rfl: 0 .  bisacodyl (DULCOLAX) 10 MG suppository, Place 1 suppository (10 mg total) rectally daily as needed for moderate constipation. (Patient not taking: Reported on 08/06/2020), Disp: 12 suppository, Rfl: 0 .  Blood Glucose Monitoring Suppl (GLUCOCOM BLOOD GLUCOSE MONITOR) DEVI, See admin instructions., Disp: , Rfl:  .  candesartan (ATACAND) 32 MG tablet, Take by mouth., Disp: , Rfl:  .  cetirizine (ZYRTEC) 10 MG tablet, Take 10 mg by mouth at bedtime., Disp: , Rfl:  .  cholecalciferol (VITAMIN D) 1000 units tablet, Take 1,000 Units by mouth daily., Disp: , Rfl:  .  Cinnamon 500 MG capsule, Take 500 mg by mouth daily., Disp: , Rfl:  .  diphenhydrAMINE (BENADRYL) 25 mg capsule, Take 25 mg by mouth as needed for itching., Disp: , Rfl:  .  docusate  sodium (COLACE) 100 MG capsule, Take 1 capsule (100 mg total) by mouth 2 (two) times daily., Disp: 10 capsule, Rfl: 0 .  ESBRIET 267 MG TABS, , Disp: , Rfl:  .  Fluticasone-Salmeterol (ADVAIR) 500-50 MCG/DOSE AEPB, Inhale 1 puff into the lungs 2 (two) times daily. , Disp: , Rfl:  .  gabapentin (NEURONTIN) 300 MG capsule, TAKE 2 CAPSULES BY MOUTH TWICE A DAY AND TAKE 3 CAPS AT BEDTIME, Disp: , Rfl:  .  hydrochlorothiazide (HYDRODIURIL) 25 MG tablet, Take 25 mg by mouth daily. (Patient not taking: Reported on 08/06/2020), Disp: , Rfl:  .  lisinopril (PRINIVIL,ZESTRIL) 40 MG tablet, Take 40 mg by mouth daily. (Patient not taking: Reported on 08/06/2020), Disp: , Rfl:  .  loratadine (CLARITIN) 10 MG tablet, Take by mouth., Disp: , Rfl:  .  magnesium oxide 400 (240 Mg) MG TABS, Take by mouth., Disp: , Rfl:  .  metFORMIN (GLUCOPHAGE) 1000 MG tablet, Take 1,000 mg by mouth every evening. (Patient not taking: Reported on 08/06/2020), Disp: , Rfl:  .  metoprolol succinate (TOPROL-XL) 50 MG 24 hr tablet, Take 50 mg by mouth daily., Disp: , Rfl:  .  Multiple Vitamin (MULTI-VITAMIN) tablet, Take 1 tablet by mouth as needed., Disp: , Rfl:  .  Omega-3 Fatty Acids (FISH OIL) 1000 MG CAPS, Take 1 capsule by mouth daily. , Disp: , Rfl:  .  predniSONE (DELTASONE) 50 MG tablet, 1  tablet daily x 5 days, Disp: 5 tablet, Rfl: 0 .  rosuvastatin (CRESTOR) 20 MG tablet, Take 20 mg by mouth daily., Disp: , Rfl:  .  vitamin B-12 (CYANOCOBALAMIN) 1000 MCG tablet, Take 1,000 mcg by mouth daily., Disp: , Rfl:   Past Medical History: Past Medical History:  Diagnosis Date  . Anemia 10/28/2010   unspecified  . Anxiety   . Aortic valve sclerosis   . Arthritis   . Asthma   . Chronic kidney disease    Chronic Kidney Disease---Stage III  . Coronary artery disease 10/28/2010  . DDD (degenerative disc disease), lumbar 2006  . Diabetes mellitus without complication (HCC)   . Diabetes type 2, controlled (HCC) 10/28/2010  .  Dyspnea   . GERD (gastroesophageal reflux disease)   . Gout 10/28/2010  . Heart abnormality   . Heart murmur   . History of cardiac catheterization 2006   LAD 50%, D1 50% rest normal.   . History of cardiovascular stress test 09/23/2009    lexiscan Normal Cardiolite test, EF 61%. LV size and function - normal  . History of echocardiogram 05/27/2008   EF 55%; Mild aortic insufficiency with minimal aortic sclerosis. L vent normal.   . Hyperlipidemia   . Hyperlipidemia LDL goal <100 10/28/2010   managed by Dr. Burr Medico  . Hypertension 10/28/2010  . Interstitial pneumonia (HCC)    unspecified  . Mild vitamin D deficiency 02/22/2011  . Obesity (BMI 30-39.9) 10/28/2010   unspecified  . 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 Personal history of colonic polyps 10/28/2010  . Shortness of breath   . Syncope 2013  . Vitamin B 12 deficiency 10/28/2010    Tobacco Use: Social History   Tobacco Use  Smoking Status Former Smoker  . Packs/day: 0.25  . Years: 10.00  . Pack years: 2.50  . Types: Cigarettes  . Quit date: 08/18/1990  . Years since quitting: 30.0  Smokeless Tobacco Never Used    Labs: Recent Review Flowsheet Data   There is no flowsheet data to display.      Pulmonary Assessment Scores:  Pulmonary Assessment Scores    Row Name 08/10/20 0959         ADL UCSD   ADL Phase Entry     SOB Score total 92     Rest 1     Walk 3     Stairs 5     Bath 4     Dress 3     Shop 5           CAT Score   CAT Score 23           mMRC Score   mMRC Score 2            UCSD: Self-administered rating of dyspnea associated with activities of daily living (ADLs) 6-point scale (0 = "not at all" to 5 = "maximal or unable to do because of breathlessness")  Scoring Scores range from 0 to 120.  Minimally important difference is 5 units  CAT: CAT can identify the health impairment of COPD patients and is better correlated with disease progression.  CAT has  a scoring range of zero to 40. The CAT score is classified into four groups of low (less than 10), medium (10 - 20), high (21-30) and very high (31-40) based on the impact level of disease on health status. A CAT score over 10 suggests significant symptoms.  A worsening CAT score could be  explained by an exacerbation, poor medication adherence, poor inhaler technique, or progression of COPD or comorbid conditions.  CAT MCID is 2 points  mMRC: mMRC (Modified Medical Research Council) Dyspnea Scale is used to assess the degree of baseline functional disability in patients of respiratory disease due to dyspnea. No minimal important difference is established. A decrease in score of 1 point or greater is considered a positive change.   Pulmonary Function Assessment:  Pulmonary Function Assessment - 08/10/20 0958      Breath   Shortness of Breath Yes;Fear of Shortness of Breath;Limiting activity           Exercise Target Goals: Exercise Program Goal: Individual exercise prescription set using results from initial 6 min walk test and THRR while considering  patient's activity barriers and safety.   Exercise Prescription Goal: Initial exercise prescription builds to 30-45 minutes a day of aerobic activity, 2-3 days per week.  Home exercise guidelines will be given to patient during program as part of exercise prescription that the participant will acknowledge.  Education: Aerobic Exercise: - Group verbal and visual presentation on the components of exercise prescription. Introduces F.I.T.T principle from ACSM for exercise prescriptions.  Reviews F.I.T.T. principles of aerobic exercise including progression. Written material given at graduation. Flowsheet Row Pulmonary Rehab from 08/10/2020 in [facility] Cardiac and Pulmonary Rehab  Education need identified 08/10/20      Education: Resistance Exercise: - Group verbal and visual presentation on the components of exercise prescription. Introduces  F.I.T.T principle from ACSM for exercise prescriptions  Reviews F.I.T.T. principles of resistance exercise including progression. Written material given at graduation.    Education: Exercise & Equipment Safety: - Individual verbal instruction and demonstration of equipment use and safety with use of the equipment. Flowsheet Row Pulmonary Rehab from 08/10/2020 in [facility] Cardiac and Pulmonary Rehab  Date 08/10/20  Educator [facility]  Instruction Review Code 1- Verbalizes Understanding      Education: Exercise Physiology & General Exercise Guidelines: - Group verbal and written instruction with models to review the exercise physiology of the cardiovascular system and associated critical values. Provides general exercise guidelines with specific guidelines to those with heart or lung disease.    Education: Flexibility, Balance, Mind/Body Relaxation: - Group verbal and visual presentation with interactive activity on the components of exercise prescription. Introduces F.I.T.T principle from ACSM for exercise prescriptions. Reviews F.I.T.T. principles of flexibility and balance exercise training including progression. Also discusses the mind body connection.  Reviews various relaxation techniques to help reduce and manage stress (i.e. Deep breathing, progressive muscle relaxation, and visualization). Balance handout provided to take home. Written material given at graduation.   Activity Barriers & Risk Stratification:  Activity Barriers & Cardiac Risk Stratification - 08/10/20 0948      Activity Barriers & Cardiac Risk Stratification   Activity Barriers Right Hip Replacement;Right Knee Replacement;Left Knee Replacement;Joint Problems;Left Hip Replacement;Back Problems;Shortness of Breath;Muscular Weakness;Deconditioning;Balance Concerns           6 Minute Walk:  6 Minute Walk    Row Name 08/10/20 0945         6 Minute Walk   Phase Initial     Distance 300 feet     Walk Time 2.45 minutes     #  of Rest Breaks 1  then desated and test terminated     MPH 1.39     METS 0.98     RPE 13     Perceived Dyspnea  3     VO2 Peak  3.43     Symptoms Yes (comment)     Comments SOB     Resting HR 84 bpm     Resting BP 146/74     Resting Oxygen Saturation  97 %     Exercise Oxygen Saturation  during 6 min walk 77 %     Max Ex. HR 111 bpm     Max Ex. BP 154/82     2 Minute Post BP 142/82           Interval HR   1 Minute HR 105     2 Minute HR 108     3 Minute HR 111     2 Minute Post HR 104     Interval Heart Rate? Yes           Interval Oxygen   Interval Oxygen? Yes     Baseline Oxygen Saturation % 97 %     1 Minute Oxygen Saturation % 86 %     1 Minute Liters of Oxygen 4 L  pulsed concentrator     2 Minute Oxygen Saturation % 82 %     2 Minute Liters of Oxygen 4 L     3 Minute Oxygen Saturation % 77 %     3 Minute Liters of Oxygen 4 L     2 Minute Post Oxygen Saturation % 88 %     2 Minute Post Liters of Oxygen 4 L           Oxygen Initial Assessment:  Oxygen Initial Assessment - 08/10/20 0959      Home Oxygen   Home Oxygen Device Home Concentrator;Portable Concentrator    Sleep Oxygen Prescription None    Home Exercise Oxygen Prescription Pulsed    Liters per minute 4    Home Resting Oxygen Prescription Pulsed    Liters per minute 2    Compliance with Home Oxygen Use Yes      Initial 6 min Walk   Oxygen Used Pulsed;Portable Concentrator    Liters per minute 4      Program Oxygen Prescription   Program Oxygen Prescription Continuous;E-Tanks    Liters per minute 4      Intervention   Short Term Goals To learn and understand importance of monitoring SPO2 with pulse oximeter and demonstrate accurate use of the pulse oximeter.;To learn and exhibit compliance with exercise, home and travel O2 prescription;To learn and understand importance of maintaining oxygen saturations>88%;To learn and demonstrate proper pursed lip breathing techniques or other breathing  techniques.;To learn and demonstrate proper use of respiratory medications    Long  Term Goals Exhibits compliance with exercise, home and travel O2 prescription;Verbalizes importance of monitoring SPO2 with pulse oximeter and return demonstration;Maintenance of O2 saturations>88%;Exhibits proper breathing techniques, such as pursed lip breathing or other method taught during program session;Compliance with respiratory medication;Demonstrates proper use of MDI's           Oxygen Re-Evaluation:   Oxygen Discharge (Final Oxygen Re-Evaluation):   Initial Exercise Prescription:  Initial Exercise Prescription - 08/10/20 0900      Date of Initial Exercise RX and Referring Provider   Date 08/10/20    Referring Provider Josph Macho MD      Oxygen   Oxygen Continuous    Liters 4      Arm Ergometer   Level 1    Watts 9    RPM 25    Minutes 15    METs 1  Biostep-RELP   Level 1    SPM 80    Minutes 15    METs 2      Track   Laps 15    Minutes 15    METs 1.8      Prescription Details   Frequency (times per week) 2    Duration Progress to 30 minutes of continuous aerobic without signs/symptoms of physical distress      Intensity   THRR 40-80% of Max Heartrate 106-129    Ratings of Perceived Exertion 11-13    Perceived Dyspnea 0-4      Progression   Progression Continue to progress workloads to maintain intensity without signs/symptoms of physical distress.      Resistance Training   Training Prescription Yes    Weight 3 lb    Reps 10-15           Perform Capillary Blood Glucose checks as needed.  Exercise Prescription Changes:  Exercise Prescription Changes    Row Name 08/10/20 0900             Response to Exercise   Blood Pressure (Admit) 146/74       Blood Pressure (Exercise) 154/82       Blood Pressure (Exit) 142/82       Heart Rate (Admit) 84 bpm       Heart Rate (Exercise) 111 bpm       Heart Rate (Exit) 104 bpm       Oxygen Saturation  (Admit) 97 %       Oxygen Saturation (Exercise) 77 %       Oxygen Saturation (Exit) 88 %       Rating of Perceived Exertion (Exercise) 13       Perceived Dyspnea (Exercise) 3       Symptoms SOB       Comments walk test results              Exercise Comments:  Exercise Comments    Row Name 08/10/20 0954           Exercise Comments Pt desaturated on walk test with pulsed concentrator.  Sent note to MD to up concentrator flow to 5L.              Exercise Goals and Review:  Exercise Goals    Row Name 08/10/20 0953             Exercise Goals   Increase Physical Activity Yes       Intervention Provide advice, education, support and counseling about physical activity/exercise needs.;Develop an individualized exercise prescription for aerobic and resistive training based on initial evaluation findings, risk stratification, comorbidities and participant's personal goals.       Expected Outcomes Short Term: Attend rehab on a regular basis to increase amount of physical activity.;Long Term: Add in home exercise to make exercise part of routine and to increase amount of physical activity.;Long Term: Exercising regularly at least 3-5 days a week.       Increase Strength and Stamina Yes       Intervention Provide advice, education, support and counseling about physical activity/exercise needs.;Develop an individualized exercise prescription for aerobic and resistive training based on initial evaluation findings, risk stratification, comorbidities and participant's personal goals.       Expected Outcomes Short Term: Perform resistance training exercises routinely during rehab and add in resistance training at home;Short Term: Increase workloads from initial exercise prescription for resistance, speed, and METs.;Long Term: Improve cardiorespiratory  fitness, muscular endurance and strength as measured by increased METs and functional capacity ( )       Able to understand and use rate of  perceived exertion (RPE) scale Yes       Intervention Provide education and explanation on how to use RPE scale       Expected Outcomes Short Term: Able to use RPE daily in rehab to express subjective intensity level;Long Term:  Able to use RPE to guide intensity level when exercising independently       Able to understand and use Dyspnea scale Yes       Intervention Provide education and explanation on how to use Dyspnea scale       Expected Outcomes Short Term: Able to use Dyspnea scale daily in rehab to express subjective sense of shortness of breath during exertion;Long Term: Able to use Dyspnea scale to guide intensity level when exercising independently       Knowledge and understanding of Target Heart Rate Range (THRR) Yes       Intervention Provide education and explanation of THRR including how the numbers were predicted and where they are located for reference       Expected Outcomes Short Term: Able to state/look up THRR;Short Term: Able to use daily as guideline for intensity in rehab;Long Term: Able to use THRR to govern intensity when exercising independently       Able to check pulse independently Yes       Intervention Provide education and demonstration on how to check pulse in carotid and radial arteries.;Review the importance of being able to check your own pulse for safety during independent exercise       Expected Outcomes Short Term: Able to explain why pulse checking is important during independent exercise;Long Term: Able to check pulse independently and accurately       Understanding of Exercise Prescription Yes       Intervention Provide education, explanation, and written materials on patient's individual exercise prescription       Expected Outcomes Short Term: Able to explain program exercise prescription;Long Term: Able to explain home exercise prescription to exercise independently              Exercise Goals Re-Evaluation :   Discharge Exercise Prescription  (Final Exercise Prescription Changes):  Exercise Prescription Changes - 08/10/20 0900      Response to Exercise   Blood Pressure (Admit) 146/74    Blood Pressure (Exercise) 154/82    Blood Pressure (Exit) 142/82    Heart Rate (Admit) 84 bpm    Heart Rate (Exercise) 111 bpm    Heart Rate (Exit) 104 bpm    Oxygen Saturation (Admit) 97 %    Oxygen Saturation (Exercise) 77 %    Oxygen Saturation (Exit) 88 %    Rating of Perceived Exertion (Exercise) 13    Perceived Dyspnea (Exercise) 3    Symptoms SOB    Comments walk test results           Nutrition:  Target Goals: Understanding of nutrition guidelines, daily intake of sodium 1500mg , cholesterol 200mg , calories 30% from fat and 7% or less from saturated fats, daily to have 5 or more servings of fruits and vegetables.  Education: All About Nutrition: -Group instruction provided by verbal, written material, interactive activities, discussions, models, and posters to present general guidelines for heart healthy nutrition including fat, fiber, MyPlate, the role of sodium in heart healthy nutrition, utilization of the nutrition label, and utilization of this  knowledge for meal planning. Follow up email sent as well. Written material given at graduation.   Biometrics:  Pre Biometrics - 08/10/20 0955      Pre Biometrics   Height 5' 5.4" (1.661 m)    Weight 155 lb 14.4 oz (70.7 kg)    BMI (Calculated) 25.63    Single Leg Stand 0 seconds            Nutrition Therapy Plan and Nutrition Goals:  Nutrition Therapy & Goals - 08/10/20 0955      Intervention Plan   Intervention Prescribe, educate and counsel regarding individualized specific dietary modifications aiming towards targeted core components such as weight, hypertension, lipid management, diabetes, heart failure and other comorbidities.    Expected Outcomes Short Term Goal: Understand basic principles of dietary content, such as calories, fat, sodium, cholesterol and  nutrients.;Short Term Goal: A plan has been developed with personal nutrition goals set during dietitian appointment.;Long Term Goal: Adherence to prescribed nutrition plan.           Nutrition Assessments:  MEDIFICTS Score Key:  ?70 Need to make dietary changes   40-70 Heart Healthy Diet  ? 40 Therapeutic Level Cholesterol Diet  Flowsheet Row Pulmonary Rehab from 08/10/2020 in [facility] Cardiac and Pulmonary Rehab  Picture Your Plate Total Score on Admission 66     Picture Your Plate Scores:  <40 Unhealthy dietary pattern with much room for improvement.  41-50 Dietary pattern unlikely to meet recommendations for good health and room for improvement.  51-60 More healthful dietary pattern, with some room for improvement.   >60 Healthy dietary pattern, although there may be some specific behaviors that could be improved.   Nutrition Goals Re-Evaluation:   Nutrition Goals Discharge (Final Nutrition Goals Re-Evaluation):   Psychosocial: Target Goals: Acknowledge presence or absence of significant depression and/or stress, maximize coping skills, provide positive support system. Participant is able to verbalize types and ability to use techniques and skills needed for reducing stress and depression.   Education: Stress, Anxiety, and Depression - Group verbal and visual presentation to define topics covered.  Reviews how body is impacted by stress, anxiety, and depression.  Also discusses healthy ways to reduce stress and to treat/manage anxiety and depression.  Written material given at graduation.   Education: Sleep Hygiene -Provides group verbal and written instruction about how sleep can affect your health.  Define sleep hygiene, discuss sleep cycles and impact of sleep habits. Review good sleep hygiene tips.    Initial Review & Psychosocial Screening:  Initial Psych Review & Screening - 08/06/20 1404      Initial Review   Current issues with Current Stress Concerns     Source of Stress Concerns Chronic Illness;Unable to participate in former interests or hobbies;Unable to perform yard/household activities      Family Dynamics   Good Support System? Yes   children     Barriers   Psychosocial barriers to participate in program There are no identifiable barriers or psychosocial needs.      Screening Interventions   Interventions Encouraged to exercise    Expected Outcomes Short Term goal: Utilizing psychosocial counselor, staff and physician to assist with identification of specific Stressors or current issues interfering with healing process. Setting desired goal for each stressor or current issue identified.;Long Term Goal: Stressors or current issues are controlled or eliminated.;Short Term goal: Identification and review with participant of any Quality of Life or Depression concerns found by scoring the questionnaire.;Long Term goal: The participant improves  quality of Life and PHQ9 Scores as seen by post scores and/or verbalization of changes           Quality of Life Scores:  Scores of 19 and below usually indicate a poorer quality of life in these areas.  A difference of  2-3 points is a clinically meaningful difference.  A difference of 2-3 points in the total score of the Quality of Life Index has been associated with significant improvement in overall quality of life, self-image, physical symptoms, and general health in studies assessing change in quality of life.  PHQ-9: Recent Review Flowsheet Data    Depression screen [facility] 2/9 08/10/2020   Decreased Interest 0   Down, Depressed, Hopeless 0   PHQ - 2 Score 0   Altered sleeping 1   Tired, decreased energy 1   Change in appetite 3   Feeling bad or failure about yourself  2   Trouble concentrating 0   Moving slowly or fidgety/restless 1   Suicidal thoughts 0   PHQ-9 Score 8   Difficult doing work/chores Not difficult at all     Interpretation of Total Score  Total Score Depression Severity:   1-4 = Minimal depression, 5-9 = Mild depression, 10-14 = Moderate depression, 15-19 = Moderately severe depression, 20-27 = Severe depression   Psychosocial Evaluation and Intervention:  Psychosocial Evaluation - 08/06/20 1426      Psychosocial Evaluation & Interventions   Interventions Encouraged to exercise with the program and follow exercise prescription    Comments Ms. Cherine's main concern is her breathing and unintentional weight loss due to her medication. She has noticed stairs and long distance walking causes a lot of shortness of breath. She wants to maintain her independence for as long as possible so she is motivated to work on her stamina.  She states that she has been sleeping well and using her supplemental oxygen as needed during physical activity. She did state she wants to feel more comfortable using her oxygen and "lugging" it around. Her daughters are very supportive.    Expected Outcomes Short: attend pulmonary rehab for education and exercsie. Long: develop positive self care habits.    Continue Psychosocial Services  Follow up required by staff           Psychosocial Re-Evaluation:   Psychosocial Discharge (Final Psychosocial Re-Evaluation):   Education: Education Goals: Education classes will be provided on a weekly basis, covering required topics. Participant will state understanding/return demonstration of topics presented.  Learning Barriers/Preferences:  Learning Barriers/Preferences - 08/06/20 1429      Learning Barriers/Preferences   Learning Barriers None    Learning Preferences None           General Pulmonary Education Topics:  Infection Prevention: - Provides verbal and written material to individual with discussion of infection control including proper hand washing and proper equipment cleaning during exercise session. Flowsheet Row Pulmonary Rehab from 08/10/2020 in [facility] Cardiac and Pulmonary Rehab  Date 08/10/20  Educator [facility]  Instruction  Review Code 1- Verbalizes Understanding      Falls Prevention: - Provides verbal and written material to individual with discussion of falls prevention and safety. Flowsheet Row Pulmonary Rehab from 08/10/2020 in [facility] Cardiac and Pulmonary Rehab  Date 08/10/20  Educator [facility]  Instruction Review Code 1- Verbalizes Understanding      Chronic Lung Disease Review: - Group verbal instruction with posters, models, PowerPoint presentations and videos,  to review new updates, new respiratory medications, new advancements in procedures  and treatments. Providing information on websites and "800" numbers for continued self-education. Includes information about supplement oxygen, available portable oxygen systems, continuous and intermittent flow rates, oxygen safety, concentrators, and Medicare reimbursement for oxygen. Explanation of Pulmonary Drugs, including class, frequency, complications, importance of spacers, rinsing mouth after steroid MDI's, and proper cleaning methods for nebulizers. Review of basic lung anatomy and physiology related to function, structure, and complications of lung disease. Review of risk factors. Discussion about methods for diagnosing sleep apnea and types of masks and machines for OSA. Includes a review of the use of types of environmental controls: home humidity, furnaces, filters, dust mite/pet prevention, HEPA vacuums. Discussion about weather changes, air quality and the benefits of nasal washing. Instruction on Warning signs, infection symptoms, calling MD promptly, preventive modes, and value of vaccinations. Review of effective airway clearance, coughing and/or vibration techniques. Emphasizing that all should Create an Action Plan. Written material given at graduation. Flowsheet Row Pulmonary Rehab from 08/10/2020 in [facility] Cardiac and Pulmonary Rehab  Education need identified 08/10/20      AED/CPR: - Group verbal and written instruction with the use of models to  demonstrate the basic use of the AED with the basic ABC's of resuscitation.    Anatomy and Cardiac Procedures: - Group verbal and visual presentation and models provide information about basic cardiac anatomy and function. Reviews the testing methods done to diagnose heart disease and the outcomes of the test results. Describes the treatment choices: Medical Management, Angioplasty, or Coronary Bypass Surgery for treating various heart conditions including Myocardial Infarction, Angina, Valve Disease, and Cardiac Arrhythmias.  Written material given at graduation.   Medication Safety: - Group verbal and visual instruction to review commonly prescribed medications for heart and lung disease. Reviews the medication, class of the drug, and side effects. Includes the steps to properly store meds and maintain the prescription regimen.  Written material given at graduation.   Other: -Provides group and verbal instruction on various topics (see comments)   Knowledge Questionnaire Score:  Knowledge Questionnaire Score - 08/10/20 0955      Knowledge Questionnaire Score   Pre Score 13/18 education focus: O2 safety, exercise            Core Components/Risk Factors/Patient Goals at Admission:  Personal Goals and Risk Factors at Admission - 08/10/20 0956      Core Components/Risk Factors/Patient Goals on Admission    Weight Management Yes;Weight Loss    Intervention Weight Management: Develop a combined nutrition and exercise program designed to reach desired caloric intake, while maintaining appropriate intake of nutrient and fiber, sodium and fats, and appropriate energy expenditure required for the weight goal.;Weight Management: Provide education and appropriate resources to help participant work on and attain dietary goals.;Weight Management/Obesity: Establish reasonable short term and long term weight goals.    Admit Weight 155 lb 14.4 oz (70.7 kg)    Goal Weight: Short Term 153 lb (69.4  kg)    Goal Weight: Long Term 150 lb (68 kg)    Expected Outcomes Short Term: Continue to assess and modify interventions until short term weight is achieved;Long Term: Adherence to nutrition and physical activity/exercise program aimed toward attainment of established weight goal;Understanding recommendations for meals to include 15-35% energy as protein, 25-35% energy from fat, 35-60% energy from carbohydrates, less than 200mg  of dietary cholesterol, 20-35 gm of total fiber daily;Weight Loss: Understanding of general recommendations for a balanced deficit meal plan, which promotes 1-2 lb weight loss per week and includes a negative  energy balance of [phone] kcal/d;Understanding of distribution of calorie intake throughout the day with the consumption of 4-5 meals/snacks    Improve shortness of breath with ADL's Yes    Intervention Provide education, individualized exercise plan and daily activity instruction to help decrease symptoms of SOB with activities of daily living.    Expected Outcomes Short Term: Improve cardiorespiratory fitness to achieve a reduction of symptoms when performing ADLs;Long Term: Be able to perform more ADLs without symptoms or delay the onset of symptoms    Hypertension Yes    Intervention Provide education on lifestyle modifcations including regular physical activity/exercise, weight management, moderate sodium restriction and increased consumption of fresh fruit, vegetables, and low fat dairy, alcohol moderation, and smoking cessation.;Monitor prescription use compliance.    Expected Outcomes Short Term: Continued assessment and intervention until BP is < 140/56mm HG in hypertensive participants. < 130/22mm HG in hypertensive participants with diabetes, heart failure or chronic kidney disease.;Long Term: Maintenance of blood pressure at goal levels.    Lipids Yes    Intervention Provide education and support for participant on nutrition & aerobic/resistive exercise along with  prescribed medications to achieve LDL 70mg , HDL >40mg .    Expected Outcomes Short Term: Participant states understanding of desired cholesterol values and is compliant with medications prescribed. Participant is following exercise prescription and nutrition guidelines.;Long Term: Cholesterol controlled with medications as prescribed, with individualized exercise RX and with personalized nutrition plan. Value goals: LDL < 70mg , HDL > 40 mg.           Education:Diabetes - Individual verbal and written instruction to review signs/symptoms of diabetes, desired ranges of glucose level fasting, after meals and with exercise. Acknowledge that pre and post exercise glucose checks will be done for 3 sessions at entry of program. Flowsheet Row Pulmonary Rehab from 08/10/2020 in [facility] Cardiac and Pulmonary Rehab  Date 08/10/20  Educator [facility]  Instruction Review Code 1- Verbalizes Understanding      Know Your Numbers and Heart Failure: - Group verbal and visual instruction to discuss disease risk factors for cardiac and pulmonary disease and treatment options.  Reviews associated critical values for Overweight/Obesity, Hypertension, Cholesterol, and Diabetes.  Discusses basics of heart failure: signs/symptoms and treatments.  Introduces Heart Failure Zone chart for action plan for heart failure.  Written material given at graduation.   Core Components/Risk Factors/Patient Goals Review:    Core Components/Risk Factors/Patient Goals at Discharge (Final Review):    ITP Comments:  ITP Comments    Row Name 08/06/20 1431 08/10/20 0945         ITP Comments Initial telephone orientation completed. Diagnosis can be found in [facility] 3/18. EP orientation scheduled for Tuesday 5/3 at 8am. Completed Wednesday and gym orientation. Initial ITP created and sent for review to Dr. , Medical Director.             Comments: Initial ITP

## 2020-08-10 NOTE — Patient Instructions (Signed)
Patient Instructions  Patient Details  Name: Sierra Guzman MRN: [id number] Date of Birth: 24-Oct-1940 Referring Provider:  Josph Macho, MD  Below are your personal goals for exercise, nutrition, and risk factors. Our goal is to help you stay on track towards obtaining and maintaining these goals. We will be discussing your progress on these goals with you throughout the program.  Initial Exercise Prescription:  Initial Exercise Prescription - 08/10/20 0900      Date of Initial Exercise RX and Referring Provider   Date 08/10/20    Referring Provider Josph Macho MD      Oxygen   Oxygen Continuous    Liters 4      Arm Ergometer   Level 1    Watts 9    RPM 25    Minutes 15    METs 1      Biostep-RELP   Level 1    SPM 80    Minutes 15    METs 2      Track   Laps 15    Minutes 15    METs 1.8      Prescription Details   Frequency (times per week) 2    Duration Progress to 30 minutes of continuous aerobic without signs/symptoms of physical distress      Intensity   THRR 40-80% of Max Heartrate 106-129    Ratings of Perceived Exertion 11-13    Perceived Dyspnea 0-4      Progression   Progression Continue to progress workloads to maintain intensity without signs/symptoms of physical distress.      Resistance Training   Training Prescription Yes    Weight 3 lb    Reps 10-15           Exercise Goals: Frequency: Be able to perform aerobic exercise two to three times per week in program working toward 2-5 days per week of home exercise.  Intensity: Work with a perceived exertion of 11 (fairly light) - 15 (hard) while following your exercise prescription.  We will make changes to your prescription with you as you progress through the program.   Duration: Be able to do 30 to 45 minutes of continuous aerobic exercise in addition to a 5 minute warm-up and a 5 minute cool-down routine.   Nutrition Goals: Your personal nutrition goals will be established when you do your  nutrition analysis with the dietician.  The following are general nutrition guidelines to follow: Cholesterol < 200mg /day Sodium < 1500mg /day Fiber: Women over 50 yrs - 21 grams per day  Personal Goals:  Personal Goals and Risk Factors at Admission - 08/10/20 0956      Core Components/Risk Factors/Patient Goals on Admission    Weight Management Yes;Weight Loss    Intervention Weight Management: Develop a combined nutrition and exercise program designed to reach desired caloric intake, while maintaining appropriate intake of nutrient and fiber, sodium and fats, and appropriate energy expenditure required for the weight goal.;Weight Management: Provide education and appropriate resources to help participant work on and attain dietary goals.;Weight Management/Obesity: Establish reasonable short term and long term weight goals.    Admit Weight 155 lb 14.4 oz (70.7 kg)    Goal Weight: Short Term 153 lb (69.4 kg)    Goal Weight: Long Term 150 lb (68 kg)    Expected Outcomes Short Term: Continue to assess and modify interventions until short term weight is achieved;Long Term: Adherence to nutrition and physical activity/exercise program aimed toward attainment of established weight goal;Understanding  recommendations for meals to include 15-35% energy as protein, 25-35% energy from fat, 35-60% energy from carbohydrates, less than 200mg  of dietary cholesterol, 20-35 gm of total fiber daily;Weight Loss: Understanding of general recommendations for a balanced deficit meal plan, which promotes 1-2 lb weight loss per week and includes a negative energy balance of [phone] kcal/d;Understanding of distribution of calorie intake throughout the day with the consumption of 4-5 meals/snacks    Improve shortness of breath with ADL's Yes    Intervention Provide education, individualized exercise plan and daily activity instruction to help decrease symptoms of SOB with activities of daily living.    Expected Outcomes  Short Term: Improve cardiorespiratory fitness to achieve a reduction of symptoms when performing ADLs;Long Term: Be able to perform more ADLs without symptoms or delay the onset of symptoms    Hypertension Yes    Intervention Provide education on lifestyle modifcations including regular physical activity/exercise, weight management, moderate sodium restriction and increased consumption of fresh fruit, vegetables, and low fat dairy, alcohol moderation, and smoking cessation.;Monitor prescription use compliance.    Expected Outcomes Short Term: Continued assessment and intervention until BP is < 140/55mm HG in hypertensive participants. < 130/28mm HG in hypertensive participants with diabetes, heart failure or chronic kidney disease.;Long Term: Maintenance of blood pressure at goal levels.    Lipids Yes    Intervention Provide education and support for participant on nutrition & aerobic/resistive exercise along with prescribed medications to achieve LDL 70mg , HDL >40mg .    Expected Outcomes Short Term: Participant states understanding of desired cholesterol values and is compliant with medications prescribed. Participant is following exercise prescription and nutrition guidelines.;Long Term: Cholesterol controlled with medications as prescribed, with individualized exercise RX and with personalized nutrition plan. Value goals: LDL < 70mg , HDL > 40 mg.           Tobacco Use Initial Evaluation: Social History   Tobacco Use  Smoking Status Former Smoker  . Packs/day: 0.25  . Years: 10.00  . Pack years: 2.50  . Types: Cigarettes  . Quit date: 08/18/1990  . Years since quitting: 30.0  Smokeless Tobacco Never Used    Exercise Goals and Review:  Exercise Goals    Row Name 08/10/20 0953             Exercise Goals   Increase Physical Activity Yes       Intervention Provide advice, education, support and counseling about physical activity/exercise needs.;Develop an individualized exercise  prescription for aerobic and resistive training based on initial evaluation findings, risk stratification, comorbidities and participant's personal goals.       Expected Outcomes Short Term: Attend rehab on a regular basis to increase amount of physical activity.;Long Term: Add in home exercise to make exercise part of routine and to increase amount of physical activity.;Long Term: Exercising regularly at least 3-5 days a week.       Increase Strength and Stamina Yes       Intervention Provide advice, education, support and counseling about physical activity/exercise needs.;Develop an individualized exercise prescription for aerobic and resistive training based on initial evaluation findings, risk stratification, comorbidities and participant's personal goals.       Expected Outcomes Short Term: Perform resistance training exercises routinely during rehab and add in resistance training at home;Short Term: Increase workloads from initial exercise prescription for resistance, speed, and METs.;Long Term: Improve cardiorespiratory fitness, muscular endurance and strength as measured by increased METs and functional capacity (10/18/1990)       Able to  understand and use rate of perceived exertion (RPE) scale Yes       Intervention Provide education and explanation on how to use RPE scale       Expected Outcomes Short Term: Able to use RPE daily in rehab to express subjective intensity level;Long Term:  Able to use RPE to guide intensity level when exercising independently       Able to understand and use Dyspnea scale Yes       Intervention Provide education and explanation on how to use Dyspnea scale       Expected Outcomes Short Term: Able to use Dyspnea scale daily in rehab to express subjective sense of shortness of breath during exertion;Long Term: Able to use Dyspnea scale to guide intensity level when exercising independently       Knowledge and understanding of Target Heart Rate Range (THRR) Yes        Intervention Provide education and explanation of THRR including how the numbers were predicted and where they are located for reference       Expected Outcomes Short Term: Able to state/look up THRR;Short Term: Able to use daily as guideline for intensity in rehab;Long Term: Able to use THRR to govern intensity when exercising independently       Able to check pulse independently Yes       Intervention Provide education and demonstration on how to check pulse in carotid and radial arteries.;Review the importance of being able to check your own pulse for safety during independent exercise       Expected Outcomes Short Term: Able to explain why pulse checking is important during independent exercise;Long Term: Able to check pulse independently and accurately       Understanding of Exercise Prescription Yes       Intervention Provide education, explanation, and written materials on patient's individual exercise prescription       Expected Outcomes Short Term: Able to explain program exercise prescription;Long Term: Able to explain home exercise prescription to exercise independently              Copy of goals given to participant.

## 2020-08-10 NOTE — Progress Notes (Signed)
Pt desaturated during walk test.  Results shared with MD to see about increasing liter flow on concentrator at home.    08/10/20 0945  6 Minute Walk  Phase Initial  Distance 300 feet  Walk Time 2.45 minutes  # of Rest Breaks 1 (then desated and test terminated)  MPH 1.39  METS 0.98  RPE 13  Perceived Dyspnea  3  VO2 Peak 3.43  Symptoms Yes (comment)  Comments SOB  Resting HR 84 bpm  Resting BP 146/74  Resting Oxygen Saturation  97 %  Exercise Oxygen Saturation  during 6 min walk 77 %  Max Ex. HR 111 bpm  Max Ex. BP 154/82  2 Minute Post BP 142/82  Interval HR  Interval Heart Rate? Yes  1 Minute HR 105  2 Minute HR 108  3 Minute HR 111  2 Minute Post HR 104  Interval Oxygen  Interval Oxygen? Yes  Baseline Oxygen Saturation % 97 %  1 Minute Oxygen Saturation % 86 %  1 Minute Liters of Oxygen 4 L (pulsed concentrator)  2 Minute Oxygen Saturation % 82 %  2 Minute Liters of Oxygen 4 L  3 Minute Oxygen Saturation % 77 %  3 Minute Liters of Oxygen 4 L  2 Minute Post Oxygen Saturation % 88 %  2 Minute Post Liters of Oxygen 4 L   Sierra Pierce, MA, [place], CCRP 08/10/2020 9:51 AM

## 2020-08-17 ENCOUNTER — Other Ambulatory Visit: Payer: Self-pay

## 2020-08-17 DIAGNOSIS — J849 Interstitial pulmonary disease, unspecified: Secondary | ICD-10-CM | POA: Diagnosis not present

## 2020-08-17 NOTE — Progress Notes (Signed)
Daily Session Note  Patient Details  Name: Sierra Guzman MRN: [id number] Date of Birth: 07-Aug-1940 Referring Provider:   Flowsheet Row Pulmonary Rehab from 08/10/2020 in [facility] Cardiac and Pulmonary Rehab  Referring Provider Vonita Moss MD      Encounter Date: 08/17/2020  Check In:  Session Check In - 08/17/20 0927      Check-In   Supervising physician immediately available to respond to emergencies See telemetry face sheet for immediately available ER MD    Location [facility]-Cardiac & Pulmonary Rehab    Staff Present Birdie Sons, MPA, Elveria Rising, BA, ACSM CEP, Exercise Physiologist;Kara Eliezer Bottom, MS Exercise Physiologist    Virtual Visit No    Medication changes reported     No    Fall or balance concerns reported    No    Warm-up and Cool-down Performed on first and last piece of equipment    Resistance Training Performed Yes    VAD Patient? No    PAD/SET Patient? No      Pain Assessment   Currently in Pain? No/denies              Social History   Tobacco Use  Smoking Status Former Smoker  . Packs/day: 0.25  . Years: 10.00  . Pack years: 2.50  . Types: Cigarettes  . Quit date: 08/18/1990  . Years since quitting: 30.0  Smokeless Tobacco Never Used    Goals Met:  Independence with exercise equipment Exercise tolerated well No report of cardiac concerns or symptoms Strength training completed today  Goals Unmet:  Not Applicable  Comments: First full day of exercise!  Patient was oriented to gym and equipment including functions, settings, policies, and procedures.  Patient's individual exercise prescription and treatment plan were reviewed.  All starting workloads were established based on the results of the 6 minute walk test done at initial orientation visit.  The plan for exercise progression was also introduced and progression will be customized based on patient's performance and goals.    Dr. Emily Filbert is Medical Director for [place] and [facility].

## 2020-08-19 ENCOUNTER — Other Ambulatory Visit: Payer: Self-pay

## 2020-08-19 DIAGNOSIS — J849 Interstitial pulmonary disease, unspecified: Secondary | ICD-10-CM | POA: Diagnosis not present

## 2020-08-19 NOTE — Progress Notes (Signed)
Daily Session Note  Patient Details  Name: VENNELA JUTTE MRN: [id number] Date of Birth: 1940-04-24 Referring Provider:   Flowsheet Row Pulmonary Rehab from 08/10/2020 in [facility] Cardiac and Pulmonary Rehab  Referring Provider Vonita Moss MD      Encounter Date: 08/19/2020  Check In:  Session Check In - 08/19/20 0918      Check-In   Supervising physician immediately available to respond to emergencies See telemetry face sheet for immediately available ER MD    Location [facility]-Cardiac & Pulmonary Rehab    Staff Present Birdie Sons, MPA, Mauricia Area, BS, ACSM CEP, Exercise Physiologist;Amanda Oletta Darter, BA, ACSM CEP, Exercise Physiologist    Virtual Visit No    Medication changes reported     No    Fall or balance concerns reported    No    Warm-up and Cool-down Performed on first and last piece of equipment    Resistance Training Performed Yes    VAD Patient? No    PAD/SET Patient? No      Pain Assessment   Currently in Pain? No/denies              Social History   Tobacco Use  Smoking Status Former Smoker  . Packs/day: 0.25  . Years: 10.00  . Pack years: 2.50  . Types: Cigarettes  . Quit date: 08/18/1990  . Years since quitting: 30.0  Smokeless Tobacco Never Used    Goals Met:  Independence with exercise equipment Exercise tolerated well No report of cardiac concerns or symptoms Strength training completed today  Goals Unmet:  Not Applicable  Comments: Pt able to follow exercise prescription today without complaint.  Will continue to monitor for progression.    Dr. Emily Filbert is Medical Director for [place] and [facility].

## 2020-08-24 ENCOUNTER — Other Ambulatory Visit: Payer: Self-pay

## 2020-08-24 DIAGNOSIS — J849 Interstitial pulmonary disease, unspecified: Secondary | ICD-10-CM | POA: Diagnosis not present

## 2020-08-24 NOTE — Progress Notes (Signed)
Daily Session Note  Patient Details  Name: Sierra Guzman MRN: [id number] Date of Birth: 11-Jun-1940 Referring Provider:   Flowsheet Row Pulmonary Rehab from 08/10/2020 in [facility]  Referring Provider Vonita Moss MD      Encounter Date: 08/24/2020  Check In:  Session Check In - 08/24/20 0921      Check-In   Supervising physician immediately available to respond to emergencies See telemetry face sheet for immediately available ER MD    Location [facility]-Cardiac & Pulmonary Rehab    Staff Present Birdie Sons, MPA, RN;Melissa Caiola RDN, Rowe Pavy, BA, ACSM CEP, Exercise Physiologist;Joseph Tessie Fass RCP,RRT,BSRT    Virtual Visit No    Medication changes reported     No    Fall or balance concerns reported    No    Warm-up and Cool-down Performed on first and last piece of equipment    Resistance Training Performed Yes    VAD Patient? No    PAD/SET Patient? No      Pain Assessment   Currently in Pain? No/denies              Social History   Tobacco Use  Smoking Status Former Smoker  . Packs/day: 0.25  . Years: 10.00  . Pack years: 2.50  . Types: Cigarettes  . Quit date: 08/18/1990  . Years since quitting: 30.0  Smokeless Tobacco Never Used    Goals Met:  Independence with exercise equipment Exercise tolerated well No report of cardiac concerns or symptoms Strength training completed today  Goals Unmet:  Not Applicable  Comments: Pt able to follow exercise prescription today without complaint.  Will continue to monitor for progression.    Dr. Emily Filbert is Medical Director for [facility].

## 2020-08-25 ENCOUNTER — Encounter: Payer: Self-pay | Admitting: *Deleted

## 2020-08-25 DIAGNOSIS — J849 Interstitial pulmonary disease, unspecified: Secondary | ICD-10-CM

## 2020-08-25 NOTE — Progress Notes (Signed)
Pulmonary Individual Treatment Plan  Patient Details  Name: Sierra Guzman MRN: [id number] Date of Birth: 11-19-1940 Referring Provider:   Flowsheet Row Pulmonary Rehab from 08/10/2020 in [facility] Cardiac and Pulmonary Rehab  Referring Provider Josph Macho MD      Initial Encounter Date:  Flowsheet Row Pulmonary Rehab from 08/10/2020 in [facility] Cardiac and Pulmonary Rehab  Date 08/10/20      Visit Diagnosis: ILD (interstitial lung disease) (HCC)  Patient's Home Medications on Admission:  Current Outpatient Medications:  .  acetaminophen (TYLENOL) 500 MG tablet, Take 2 tablets (1,000 mg total) by mouth every 6 (six) hours as needed for mild pain (or Fever >/= 101)., Disp: 30 tablet, Rfl: 0 .  albuterol (VENTOLIN HFA) 108 (90 Base) MCG/ACT inhaler, Inhale 1-2 puffs into the lungs every 6 (six) hours as needed for wheezing or shortness of breath., Disp: , Rfl:  .  allopurinol (ZYLOPRIM) 300 MG tablet, Take 300 mg by mouth daily. , Disp: , Rfl:  .  aspirin EC 81 MG tablet, Take 81 mg by mouth daily., Disp: , Rfl:  .  benzonatate (TESSALON) 200 MG capsule, Take 1 capsule (200 mg total) by mouth 3 (three) times daily as needed for cough., Disp: 30 capsule, Rfl: 0 .  bisacodyl (DULCOLAX) 10 MG suppository, Place 1 suppository (10 mg total) rectally daily as needed for moderate constipation. (Patient not taking: Reported on 08/06/2020), Disp: 12 suppository, Rfl: 0 .  Blood Glucose Monitoring Suppl (GLUCOCOM BLOOD GLUCOSE MONITOR) DEVI, See admin instructions., Disp: , Rfl:  .  candesartan (ATACAND) 32 MG tablet, Take by mouth., Disp: , Rfl:  .  cetirizine (ZYRTEC) 10 MG tablet, Take 10 mg by mouth at bedtime., Disp: , Rfl:  .  cholecalciferol (VITAMIN D) 1000 units tablet, Take 1,000 Units by mouth daily., Disp: , Rfl:  .  Cinnamon 500 MG capsule, Take 500 mg by mouth daily., Disp: , Rfl:  .  diphenhydrAMINE (BENADRYL) 25 mg capsule, Take 25 mg by mouth as needed for itching., Disp: , Rfl:  .  docusate  sodium (COLACE) 100 MG capsule, Take 1 capsule (100 mg total) by mouth 2 (two) times daily., Disp: 10 capsule, Rfl: 0 .  ESBRIET 267 MG TABS, , Disp: , Rfl:  .  Fluticasone-Salmeterol (ADVAIR) 500-50 MCG/DOSE AEPB, Inhale 1 puff into the lungs 2 (two) times daily. , Disp: , Rfl:  .  gabapentin (NEURONTIN) 300 MG capsule, TAKE 2 CAPSULES BY MOUTH TWICE A DAY AND TAKE 3 CAPS AT BEDTIME, Disp: , Rfl:  .  hydrochlorothiazide (HYDRODIURIL) 25 MG tablet, Take 25 mg by mouth daily. (Patient not taking: Reported on 08/06/2020), Disp: , Rfl:  .  lisinopril (PRINIVIL,ZESTRIL) 40 MG tablet, Take 40 mg by mouth daily. (Patient not taking: Reported on 08/06/2020), Disp: , Rfl:  .  loratadine (CLARITIN) 10 MG tablet, Take by mouth., Disp: , Rfl:  .  magnesium oxide 400 (240 Mg) MG TABS, Take by mouth., Disp: , Rfl:  .  metFORMIN (GLUCOPHAGE) 1000 MG tablet, Take 1,000 mg by mouth every evening. (Patient not taking: Reported on 08/06/2020), Disp: , Rfl:  .  metoprolol succinate (TOPROL-XL) 50 MG 24 hr tablet, Take 50 mg by mouth daily., Disp: , Rfl:  .  Multiple Vitamin (MULTI-VITAMIN) tablet, Take 1 tablet by mouth as needed., Disp: , Rfl:  .  Omega-3 Fatty Acids (FISH OIL) 1000 MG CAPS, Take 1 capsule by mouth daily. , Disp: , Rfl:  .  predniSONE (DELTASONE) 50 MG tablet, 1  tablet daily x 5 days, Disp: 5 tablet, Rfl: 0 .  rosuvastatin (CRESTOR) 20 MG tablet, Take 20 mg by mouth daily., Disp: , Rfl:  .  vitamin B-12 (CYANOCOBALAMIN) 1000 MCG tablet, Take 1,000 mcg by mouth daily., Disp: , Rfl:   Past Medical History: Past Medical History:  Diagnosis Date  . Anemia 10/28/2010   unspecified  . Anxiety   . Aortic valve sclerosis   . Arthritis   . Asthma   . Chronic kidney disease    Chronic Kidney Disease---Stage III  . Coronary artery disease 10/28/2010  . DDD (degenerative disc disease), lumbar 2006  . Diabetes mellitus without complication (HCC)   . Diabetes type 2, controlled (HCC) 10/28/2010  .  Dyspnea   . GERD (gastroesophageal reflux disease)   . Gout 10/28/2010  . Heart abnormality   . Heart murmur   . History of cardiac catheterization 2006   LAD 50%, D1 50% rest normal.   . History of cardiovascular stress test 09/23/2009    lexiscan Normal Cardiolite test, EF 61%. LV size and function - normal  . History of echocardiogram 05/27/2008   EF 55%; Mild aortic insufficiency with minimal aortic sclerosis. L vent normal.   . Hyperlipidemia   . Hyperlipidemia LDL goal <100 10/28/2010   managed by Dr. Burr Medico  . Hypertension 10/28/2010  . Interstitial pneumonia (HCC)    unspecified  . Mild vitamin D deficiency 02/22/2011  . Obesity (BMI 30-39.9) 10/28/2010   unspecified  . 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 Personal history of colonic polyps 10/28/2010  . Shortness of breath   . Syncope 2013  . Vitamin B 12 deficiency 10/28/2010    Tobacco Use: Social History   Tobacco Use  Smoking Status Former Smoker  . Packs/day: 0.25  . Years: 10.00  . Pack years: 2.50  . Types: Cigarettes  . Quit date: 08/18/1990  . Years since quitting: 30.0  Smokeless Tobacco Never Used    Labs: Recent Review Flowsheet Data   There is no flowsheet data to display.      Pulmonary Assessment Scores:  Pulmonary Assessment Scores    Row Name 08/10/20 0959         ADL UCSD   ADL Phase Entry     SOB Score total 92     Rest 1     Walk 3     Stairs 5     Bath 4     Dress 3     Shop 5           CAT Score   CAT Score 23           mMRC Score   mMRC Score 2            UCSD: Self-administered rating of dyspnea associated with activities of daily living (ADLs) 6-point scale (0 = "not at all" to 5 = "maximal or unable to do because of breathlessness")  Scoring Scores range from 0 to 120.  Minimally important difference is 5 units  CAT: CAT can identify the health impairment of COPD patients and is better correlated with disease progression.  CAT has  a scoring range of zero to 40. The CAT score is classified into four groups of low (less than 10), medium (10 - 20), high (21-30) and very high (31-40) based on the impact level of disease on health status. A CAT score over 10 suggests significant symptoms.  A worsening CAT score could be  explained by an exacerbation, poor medication adherence, poor inhaler technique, or progression of COPD or comorbid conditions.  CAT MCID is 2 points  mMRC: mMRC (Modified Medical Research Council) Dyspnea Scale is used to assess the degree of baseline functional disability in patients of respiratory disease due to dyspnea. No minimal important difference is established. A decrease in score of 1 point or greater is considered a positive change.   Pulmonary Function Assessment:  Pulmonary Function Assessment - 08/10/20 0958      Breath   Shortness of Breath Yes;Fear of Shortness of Breath;Limiting activity           Exercise Target Goals: Exercise Program Goal: Individual exercise prescription set using results from initial 6 min walk test and THRR while considering  patient's activity barriers and safety.   Exercise Prescription Goal: Initial exercise prescription builds to 30-45 minutes a day of aerobic activity, 2-3 days per week.  Home exercise guidelines will be given to patient during program as part of exercise prescription that the participant will acknowledge.  Education: Aerobic Exercise: - Group verbal and visual presentation on the components of exercise prescription. Introduces F.I.T.T principle from ACSM for exercise prescriptions.  Reviews F.I.T.T. principles of aerobic exercise including progression. Written material given at graduation. Flowsheet Row Pulmonary Rehab from 08/19/2020 in [facility] Cardiac and Pulmonary Rehab  Education need identified 08/10/20      Education: Resistance Exercise: - Group verbal and visual presentation on the components of exercise prescription. Introduces  F.I.T.T principle from ACSM for exercise prescriptions  Reviews F.I.T.T. principles of resistance exercise including progression. Written material given at graduation.    Education: Exercise & Equipment Safety: - Individual verbal instruction and demonstration of equipment use and safety with use of the equipment. Flowsheet Row Pulmonary Rehab from 08/19/2020 in [facility] Cardiac and Pulmonary Rehab  Date 08/10/20  Educator [facility]  Instruction Review Code 1- Verbalizes Understanding      Education: Exercise Physiology & General Exercise Guidelines: - Group verbal and written instruction with models to review the exercise physiology of the cardiovascular system and associated critical values. Provides general exercise guidelines with specific guidelines to those with heart or lung disease.    Education: Flexibility, Balance, Mind/Body Relaxation: - Group verbal and visual presentation with interactive activity on the components of exercise prescription. Introduces F.I.T.T principle from ACSM for exercise prescriptions. Reviews F.I.T.T. principles of flexibility and balance exercise training including progression. Also discusses the mind body connection.  Reviews various relaxation techniques to help reduce and manage stress (i.e. Deep breathing, progressive muscle relaxation, and visualization). Balance handout provided to take home. Written material given at graduation.   Activity Barriers & Risk Stratification:  Activity Barriers & Cardiac Risk Stratification - 08/10/20 0948      Activity Barriers & Cardiac Risk Stratification   Activity Barriers Right Hip Replacement;Right Knee Replacement;Left Knee Replacement;Joint Problems;Left Hip Replacement;Back Problems;Shortness of Breath;Muscular Weakness;Deconditioning;Balance Concerns           6 Minute Walk:  6 Minute Walk    Row Name 08/10/20 0945         6 Minute Walk   Phase Initial     Distance 300 feet     Walk Time 2.45 minutes     #  of Rest Breaks 1  then desated and test terminated     MPH 1.39     METS 0.98     RPE 13     Perceived Dyspnea  3     VO2 Peak  3.43     Symptoms Yes (comment)     Comments SOB     Resting HR 84 bpm     Resting BP 146/74     Resting Oxygen Saturation  97 %     Exercise Oxygen Saturation  during 6 min walk 77 %     Max Ex. HR 111 bpm     Max Ex. BP 154/82     2 Minute Post BP 142/82           Interval HR   1 Minute HR 105     2 Minute HR 108     3 Minute HR 111     2 Minute Post HR 104     Interval Heart Rate? Yes           Interval Oxygen   Interval Oxygen? Yes     Baseline Oxygen Saturation % 97 %     1 Minute Oxygen Saturation % 86 %     1 Minute Liters of Oxygen 4 L  pulsed concentrator     2 Minute Oxygen Saturation % 82 %     2 Minute Liters of Oxygen 4 L     3 Minute Oxygen Saturation % 77 %     3 Minute Liters of Oxygen 4 L     2 Minute Post Oxygen Saturation % 88 %     2 Minute Post Liters of Oxygen 4 L           Oxygen Initial Assessment:  Oxygen Initial Assessment - 08/10/20 0959      Home Oxygen   Home Oxygen Device Home Concentrator;Portable Concentrator    Sleep Oxygen Prescription None    Home Exercise Oxygen Prescription Pulsed    Liters per minute 4    Home Resting Oxygen Prescription Pulsed    Liters per minute 2    Compliance with Home Oxygen Use Yes      Initial 6 min Walk   Oxygen Used Pulsed;Portable Concentrator    Liters per minute 4      Program Oxygen Prescription   Program Oxygen Prescription Continuous;E-Tanks    Liters per minute 4      Intervention   Short Term Goals To learn and understand importance of monitoring SPO2 with pulse oximeter and demonstrate accurate use of the pulse oximeter.;To learn and exhibit compliance with exercise, home and travel O2 prescription;To learn and understand importance of maintaining oxygen saturations>88%;To learn and demonstrate proper pursed lip breathing techniques or other breathing  techniques.;To learn and demonstrate proper use of respiratory medications    Long  Term Goals Exhibits compliance with exercise, home and travel O2 prescription;Verbalizes importance of monitoring SPO2 with pulse oximeter and return demonstration;Maintenance of O2 saturations>88%;Exhibits proper breathing techniques, such as pursed lip breathing or other method taught during program session;Compliance with respiratory medication;Demonstrates proper use of MDI's           Oxygen Re-Evaluation:  Oxygen Re-Evaluation    Row Name 08/17/20 0928             Program Oxygen Prescription   Program Oxygen Prescription Continuous;E-Tanks       Liters per minute 4               Home Oxygen   Home Oxygen Device Home Concentrator;Portable Concentrator       Sleep Oxygen Prescription None       Home Exercise Oxygen Prescription Pulsed       Liters  per minute 4       Home Resting Oxygen Prescription Pulsed       Liters per minute 2       Compliance with Home Oxygen Use Yes               Goals/Expected Outcomes   Short Term Goals To learn and understand importance of monitoring SPO2 with pulse oximeter and demonstrate accurate use of the pulse oximeter.;To learn and exhibit compliance with exercise, home and travel O2 prescription;To learn and understand importance of maintaining oxygen saturations>88%;To learn and demonstrate proper pursed lip breathing techniques or other breathing techniques.       Long  Term Goals Exhibits compliance with exercise, home and travel O2 prescription;Verbalizes importance of monitoring SPO2 with pulse oximeter and return demonstration;Maintenance of O2 saturations>88%;Exhibits proper breathing techniques, such as pursed lip breathing or other method taught during program session;Compliance with respiratory medication       Comments Reviewed PLB technique with pt.  Talked about how it works and it's importance in maintaining their exercise saturations.        Goals/Expected Outcomes Short: Become more profiecient at using PLB.   Long: Become independent at using PLB.              Oxygen Discharge (Final Oxygen Re-Evaluation):  Oxygen Re-Evaluation - 08/17/20 0928      Program Oxygen Prescription   Program Oxygen Prescription Continuous;E-Tanks    Liters per minute 4      Home Oxygen   Home Oxygen Device Home Concentrator;Portable Concentrator    Sleep Oxygen Prescription None    Home Exercise Oxygen Prescription Pulsed    Liters per minute 4    Home Resting Oxygen Prescription Pulsed    Liters per minute 2    Compliance with Home Oxygen Use Yes      Goals/Expected Outcomes   Short Term Goals To learn and understand importance of monitoring SPO2 with pulse oximeter and demonstrate accurate use of the pulse oximeter.;To learn and exhibit compliance with exercise, home and travel O2 prescription;To learn and understand importance of maintaining oxygen saturations>88%;To learn and demonstrate proper pursed lip breathing techniques or other breathing techniques.    Long  Term Goals Exhibits compliance with exercise, home and travel O2 prescription;Verbalizes importance of monitoring SPO2 with pulse oximeter and return demonstration;Maintenance of O2 saturations>88%;Exhibits proper breathing techniques, such as pursed lip breathing or other method taught during program session;Compliance with respiratory medication    Comments Reviewed PLB technique with pt.  Talked about how it works and it's importance in maintaining their exercise saturations.    Goals/Expected Outcomes Short: Become more profiecient at using PLB.   Long: Become independent at using PLB.           Initial Exercise Prescription:  Initial Exercise Prescription - 08/10/20 0900      Date of Initial Exercise RX and Referring Provider   Date 08/10/20    Referring Provider Josph Macho MD      Oxygen   Oxygen Continuous    Liters 4      Arm Ergometer   Level 1     Watts 9    RPM 25    Minutes 15    METs 1      Biostep-RELP   Level 1    SPM 80    Minutes 15    METs 2      Track   Laps 15    Minutes 15  METs 1.8      Prescription Details   Frequency (times per week) 2    Duration Progress to 30 minutes of continuous aerobic without signs/symptoms of physical distress      Intensity   THRR 40-80% of Max Heartrate 106-129    Ratings of Perceived Exertion 11-13    Perceived Dyspnea 0-4      Progression   Progression Continue to progress workloads to maintain intensity without signs/symptoms of physical distress.      Resistance Training   Training Prescription Yes    Weight 3 lb    Reps 10-15           Perform Capillary Blood Glucose checks as needed.  Exercise Prescription Changes:  Exercise Prescription Changes    Row Name 08/10/20 0900 08/17/20 1400           Response to Exercise   Blood Pressure (Admit) 146/74 106/62      Blood Pressure (Exercise) 154/82 118/60      Blood Pressure (Exit) 142/82 102/58      Heart Rate (Admit) 84 bpm 76 bpm      Heart Rate (Exercise) 111 bpm 104 bpm      Heart Rate (Exit) 104 bpm 80 bpm      Oxygen Saturation (Admit) 97 % 91 %      Oxygen Saturation (Exercise) 77 % 90 %      Oxygen Saturation (Exit) 88 % 91 %      Rating of Perceived Exertion (Exercise) 13 15      Perceived Dyspnea (Exercise) 3 2      Symptoms SOB SOB      Comments walk test results first day             Progression   Progression -- Continue to progress workloads to maintain intensity without signs/symptoms of physical distress.             Resistance Training   Training Prescription -- Yes      Weight -- 3 lb      Reps -- 10-15             Oxygen   Oxygen -- Continuous      Liters -- 4             Arm Ergometer   Level -- 1      Minutes -- 15             Track   Laps -- 2      Minutes -- 15             Exercise Comments:  Exercise Comments    Row Name 08/10/20 0954 08/17/20 0927          Exercise Comments Pt desaturated on walk test with pulsed concentrator.  Sent note to MD to up concentrator flow to 5L. First full day of exercise!  Patient was oriented to gym and equipment including functions, settings, policies, and procedures.  Patient's individual exercise prescription and treatment plan were reviewed.  All starting workloads were established based on the results of the 6 minute walk test done at initial orientation visit.  The plan for exercise progression was also introduced and progression will be customized based on patient's performance and goals.             Exercise Goals and Review:  Exercise Goals    Row Name 08/10/20 [phone]  Exercise Goals   Increase Physical Activity Yes       Intervention Provide advice, education, support and counseling about physical activity/exercise needs.;Develop an individualized exercise prescription for aerobic and resistive training based on initial evaluation findings, risk stratification, comorbidities and participant's personal goals.       Expected Outcomes Short Term: Attend rehab on a regular basis to increase amount of physical activity.;Long Term: Add in home exercise to make exercise part of routine and to increase amount of physical activity.;Long Term: Exercising regularly at least 3-5 days a week.       Increase Strength and Stamina Yes       Intervention Provide advice, education, support and counseling about physical activity/exercise needs.;Develop an individualized exercise prescription for aerobic and resistive training based on initial evaluation findings, risk stratification, comorbidities and participant's personal goals.       Expected Outcomes Short Term: Perform resistance training exercises routinely during rehab and add in resistance training at home;Short Term: Increase workloads from initial exercise prescription for resistance, speed, and METs.;Long Term: Improve cardiorespiratory fitness,  muscular endurance and strength as measured by increased METs and functional capacity ( )       Able to understand and use rate of perceived exertion (RPE) scale Yes       Intervention Provide education and explanation on how to use RPE scale       Expected Outcomes Short Term: Able to use RPE daily in rehab to express subjective intensity level;Long Term:  Able to use RPE to guide intensity level when exercising independently       Able to understand and use Dyspnea scale Yes       Intervention Provide education and explanation on how to use Dyspnea scale       Expected Outcomes Short Term: Able to use Dyspnea scale daily in rehab to express subjective sense of shortness of breath during exertion;Long Term: Able to use Dyspnea scale to guide intensity level when exercising independently       Knowledge and understanding of Target Heart Rate Range (THRR) Yes       Intervention Provide education and explanation of THRR including how the numbers were predicted and where they are located for reference       Expected Outcomes Short Term: Able to state/look up THRR;Short Term: Able to use daily as guideline for intensity in rehab;Long Term: Able to use THRR to govern intensity when exercising independently       Able to check pulse independently Yes       Intervention Provide education and demonstration on how to check pulse in carotid and radial arteries.;Review the importance of being able to check your own pulse for safety during independent exercise       Expected Outcomes Short Term: Able to explain why pulse checking is important during independent exercise;Long Term: Able to check pulse independently and accurately       Understanding of Exercise Prescription Yes       Intervention Provide education, explanation, and written materials on patient's individual exercise prescription       Expected Outcomes Short Term: Able to explain program exercise prescription;Long Term: Able to explain home  exercise prescription to exercise independently              Exercise Goals Re-Evaluation :  Exercise Goals Re-Evaluation    Row Name 08/17/20 0927             Exercise Goal Re-Evaluation   Exercise Goals Review  Increase Physical Activity;Able to understand and use rate of perceived exertion (RPE) scale;Knowledge and understanding of Target Heart Rate Range (THRR);Understanding of Exercise Prescription;Increase Strength and Stamina;Able to understand and use Dyspnea scale;Able to check pulse independently       Comments Reviewed RPE and dyspnea scales, THR and program prescription with pt today.  Pt voiced understanding and was given a copy of goals to take home.       Expected Outcomes Short: Use RPE daily to regulate intensity. Long: Follow program prescription in THR.              Discharge Exercise Prescription (Final Exercise Prescription Changes):  Exercise Prescription Changes - 08/17/20 1400      Response to Exercise   Blood Pressure (Admit) 106/62    Blood Pressure (Exercise) 118/60    Blood Pressure (Exit) 102/58    Heart Rate (Admit) 76 bpm    Heart Rate (Exercise) 104 bpm    Heart Rate (Exit) 80 bpm    Oxygen Saturation (Admit) 91 %    Oxygen Saturation (Exercise) 90 %    Oxygen Saturation (Exit) 91 %    Rating of Perceived Exertion (Exercise) 15    Perceived Dyspnea (Exercise) 2    Symptoms SOB    Comments first day      Progression   Progression Continue to progress workloads to maintain intensity without signs/symptoms of physical distress.      Resistance Training   Training Prescription Yes    Weight 3 lb    Reps 10-15      Oxygen   Oxygen Continuous    Liters 4      Arm Ergometer   Level 1    Minutes 15      Track   Laps 2    Minutes 15           Nutrition:  Target Goals: Understanding of nutrition guidelines, daily intake of sodium 1500mg , cholesterol 200mg , calories 30% from fat and 7% or less from saturated fats, daily to have 5  or more servings of fruits and vegetables.  Education: All About Nutrition: -Group instruction provided by verbal, written material, interactive activities, discussions, models, and posters to present general guidelines for heart healthy nutrition including fat, fiber, MyPlate, the role of sodium in heart healthy nutrition, utilization of the nutrition label, and utilization of this knowledge for meal planning. Follow up email sent as well. Written material given at graduation. Flowsheet Row Pulmonary Rehab from 08/19/2020 in [facility] Cardiac and Pulmonary Rehab  Date 08/19/20  Educator [facility]  Instruction Review Code 1- Verbalizes Understanding      Biometrics:  Pre Biometrics - 08/10/20 0955      Pre Biometrics   Height 5' 5.4" (1.661 m)    Weight 155 lb 14.4 oz (70.7 kg)    BMI (Calculated) 25.63    Single Leg Stand 0 seconds            Nutrition Therapy Plan and Nutrition Goals:  Nutrition Therapy & Goals - 08/10/20 0955      Intervention Plan   Intervention Prescribe, educate and counsel regarding individualized specific dietary modifications aiming towards targeted core components such as weight, hypertension, lipid management, diabetes, heart failure and other comorbidities.    Expected Outcomes Short Term Goal: Understand basic principles of dietary content, such as calories, fat, sodium, cholesterol and nutrients.;Short Term Goal: A plan has been developed with personal nutrition goals set during dietitian appointment.;Long Term Goal: Adherence to prescribed nutrition  plan.           Nutrition Assessments:  MEDIFICTS Score Key:  ?70 Need to make dietary changes   40-70 Heart Healthy Diet  ? 40 Therapeutic Level Cholesterol Diet  Flowsheet Row Pulmonary Rehab from 08/10/2020 in [facility] Card[facility]  Picture Your Plate Total Score on Admission 66     Picture Your Plate Scores:  <63<40 Unhealthy dietary pattern with much room for improvement.  41-50 Dietary  pattern unlikely to meet recommendations for good health and room for improvement.  51-60 More healthful dietary pattern, with some room for improvement.   >60 Healthy dietary pattern, although there may be some specific behaviors that could be improved.   Nutrition Goals Re-Evaluation:   Nutrition Goals Discharge (Final Nutrition Goals Re-Evaluation):   Psychosocial: Target Goals: Acknowledge presence or absence of significant depression and/or stress, maximize coping skills, provide positive support system. Participant is able to verbalize types and ability to use techniques and skills needed for reducing stress and depression.   Education: Stress, Anxiety, and Depression - Group verbal and visual presentation to define topics covered.  Reviews how body is impacted by stress, anxiety, and depression.  Also discusses healthy ways to reduce stress and to treat/manage anxiety and depression.  Written material given at graduation.   Education: Sleep Hygiene -Provides group verbal and written instruction about how sleep can affect your health.  Define sleep hygiene, discuss sleep cycles and impact of sleep habits. Review good sleep hygiene tips.    Initial Review & Psychosocial Screening:  Initial Psych Review & Screening - 08/06/20 1404      Initial Review   Current issues with Current Stress Concerns    Source of Stress Concerns Chronic Illness;Unable to participate in former interests or hobbies;Unable to perform yard/household activities      Family Dynamics   Good Support System? Yes   children     Barriers   Psychosocial barriers to participate in program There are no identifiable barriers or psychosocial needs.      Screening Interventions   Interventions Encouraged to exercise    Expected Outcomes Short Term goal: Utilizing psychosocial counselor, staff and physician to assist with identification of specific Stressors or current issues interfering with healing process.  Setting desired goal for each stressor or current issue identified.;Long Term Goal: Stressors or current issues are controlled or eliminated.;Short Term goal: Identification and review with participant of any Quality of Life or Depression concerns found by scoring the questionnaire.;Long Term goal: The participant improves quality of Life and PHQ9 Scores as seen by post scores and/or verbalization of changes           Quality of Life Scores:  Scores of 19 and below usually indicate a poorer quality of life in these areas.  A difference of  2-3 points is a clinically meaningful difference.  A difference of 2-3 points in the total score of the Quality of Life Index has been associated with significant improvement in overall quality of life, self-image, physical symptoms, and general health in studies assessing change in quality of life.  PHQ-9: Recent Review Flowsheet Data    Depression screen [facility] 2/[facility] 08/10/2020   Decreased Interest 0   Down, Depressed, Hopeless 0   PHQ - 2 Score 0   Altered sleeping 1   Tired, decreased energy 1   Change in appetite 3   Feeling bad or failure about yourself  2   Trouble concentrating 0   Moving slowly  or fidgety/restless 1   Suicidal thoughts 0   PHQ-9 Score 8   Difficult doing work/chores Not difficult at all     Interpretation of Total Score  Total Score Depression Severity:  1-4 = Minimal depression, 5-9 = Mild depression, 10-14 = Moderate depression, 15-19 = Moderately severe depression, 20-27 = Severe depression   Psychosocial Evaluation and Intervention:  Psychosocial Evaluation - 08/06/20 1426      Psychosocial Evaluation & Interventions   Interventions Encouraged to exercise with the program and follow exercise prescription    Comments Ms. Brielynn's main concern is her breathing and unintentional weight loss due to her medication. She has noticed stairs and long distance walking causes a lot of shortness of breath. She wants to maintain her  independence for as long as possible so she is motivated to work on her stamina.  She states that she has been sleeping well and using her supplemental oxygen as needed during physical activity. She did state she wants to feel more comfortable using her oxygen and "lugging" it around. Her daughters are very supportive.    Expected Outcomes Short: attend pulmonary rehab for education and exercsie. Long: develop positive self care habits.    Continue Psychosocial Services  Follow up required by staff           Psychosocial Re-Evaluation:   Psychosocial Discharge (Final Psychosocial Re-Evaluation):   Education: Education Goals: Education classes will be provided on a weekly basis, covering required topics. Participant will state understanding/return demonstration of topics presented.  Learning Barriers/Preferences:  Learning Barriers/Preferences - 08/06/20 1429      Learning Barriers/Preferences   Learning Barriers None    Learning Preferences None           General Pulmonary Education Topics:  Infection Prevention: - Provides verbal and written material to individual with discussion of infection control including proper hand washing and proper equipment cleaning during exercise session. Flowsheet Row Pulmonary Rehab from 08/19/2020 in [facility] Cardiac and Pulmonary Rehab  Date 08/10/20  Educator [facility]  Instruction Review Code 1- Verbalizes Understanding      Falls Prevention: - Provides verbal and written material to individual with discussion of falls prevention and safety. Flowsheet Row Pulmonary Rehab from 08/19/2020 in [facility] Cardiac and Pulmonary Rehab  Date 08/10/20  Educator [facility]  Instruction Review Code 1- Verbalizes Understanding      Chronic Lung Disease Review: - Group verbal instruction with posters, models, PowerPoint presentations and videos,  to review new updates, new respiratory medications, new advancements in procedures and treatments. Providing information on  websites and "800" numbers for continued self-education. Includes information about supplement oxygen, available portable oxygen systems, continuous and intermittent flow rates, oxygen safety, concentrators, and Medicare reimbursement for oxygen. Explanation of Pulmonary Drugs, including class, frequency, complications, importance of spacers, rinsing mouth after steroid MDI's, and proper cleaning methods for nebulizers. Review of basic lung anatomy and physiology related to function, structure, and complications of lung disease. Review of risk factors. Discussion about methods for diagnosing sleep apnea and types of masks and machines for OSA. Includes a review of the use of types of environmental controls: home humidity, furnaces, filters, dust mite/pet prevention, HEPA vacuums. Discussion about weather changes, air quality and the benefits of nasal washing. Instruction on Warning signs, infection symptoms, calling MD promptly, preventive modes, and value of vaccinations. Review of effective airway clearance, coughing and/or vibration techniques. Emphasizing that all should Create an Action Plan. Written material given at graduation. Flowsheet Row Pulmonary Rehab from 08/19/2020  in [facility] Cardiac and Pulmonary Rehab  Education need identified 08/10/20      AED/CPR: - Group verbal and written instruction with the use of models to demonstrate the basic use of the AED with the basic ABC's of resuscitation.    Anatomy and Cardiac Procedures: - Group verbal and visual presentation and models provide information about basic cardiac anatomy and function. Reviews the testing methods done to diagnose heart disease and the outcomes of the test results. Describes the treatment choices: Medical Management, Angioplasty, or Coronary Bypass Surgery for treating various heart conditions including Myocardial Infarction, Angina, Valve Disease, and Cardiac Arrhythmias.  Written material given at graduation.   Medication  Safety: - Group verbal and visual instruction to review commonly prescribed medications for heart and lung disease. Reviews the medication, class of the drug, and side effects. Includes the steps to properly store meds and maintain the prescription regimen.  Written material given at graduation.   Other: -Provides group and verbal instruction on various topics (see comments)   Knowledge Questionnaire Score:  Knowledge Questionnaire Score - 08/10/20 0955      Knowledge Questionnaire Score   Pre Score 13/18 education focus: O2 safety, exercise            Core Components/Risk Factors/Patient Goals at Admission:  Personal Goals and Risk Factors at Admission - 08/10/20 0956      Core Components/Risk Factors/Patient Goals on Admission    Weight Management Yes;Weight Loss    Intervention Weight Management: Develop a combined nutrition and exercise program designed to reach desired caloric intake, while maintaining appropriate intake of nutrient and fiber, sodium and fats, and appropriate energy expenditure required for the weight goal.;Weight Management: Provide education and appropriate resources to help participant work on and attain dietary goals.;Weight Management/Obesity: Establish reasonable short term and long term weight goals.    Admit Weight 155 lb 14.4 oz (70.7 kg)    Goal Weight: Short Term 153 lb (69.4 kg)    Goal Weight: Long Term 150 lb (68 kg)    Expected Outcomes Short Term: Continue to assess and modify interventions until short term weight is achieved;Long Term: Adherence to nutrition and physical activity/exercise program aimed toward attainment of established weight goal;Understanding recommendations for meals to include 15-35% energy as protein, 25-35% energy from fat, 35-60% energy from carbohydrates, less than 200mg  of dietary cholesterol, 20-35 gm of total fiber daily;Weight Loss: Understanding of general recommendations for a balanced deficit meal plan, which promotes  1-2 lb weight loss per week and includes a negative energy balance of [phone] kcal/d;Understanding of distribution of calorie intake throughout the day with the consumption of 4-5 meals/snacks    Improve shortness of breath with ADL's Yes    Intervention Provide education, individualized exercise plan and daily activity instruction to help decrease symptoms of SOB with activities of daily living.    Expected Outcomes Short Term: Improve cardiorespiratory fitness to achieve a reduction of symptoms when performing ADLs;Long Term: Be able to perform more ADLs without symptoms or delay the onset of symptoms    Hypertension Yes    Intervention Provide education on lifestyle modifcations including regular physical activity/exercise, weight management, moderate sodium restriction and increased consumption of fresh fruit, vegetables, and low fat dairy, alcohol moderation, and smoking cessation.;Monitor prescription use compliance.    Expected Outcomes Short Term: Continued assessment and intervention until BP is < 140/53mm HG in hypertensive participants. < 130/72mm HG in hypertensive participants with diabetes, heart failure or chronic kidney disease.;Long Term: Maintenance of  blood pressure at goal levels.    Lipids Yes    Intervention Provide education and support for participant on nutrition & aerobic/resistive exercise along with prescribed medications to achieve LDL 70mg , HDL >40mg .    Expected Outcomes Short Term: Participant states understanding of desired cholesterol values and is compliant with medications prescribed. Participant is following exercise prescription and nutrition guidelines.;Long Term: Cholesterol controlled with medications as prescribed, with individualized exercise RX and with personalized nutrition plan. Value goals: LDL < 70mg , HDL > 40 mg.           Education:Diabetes - Individual verbal and written instruction to review signs/symptoms of diabetes, desired ranges of glucose  level fasting, after meals and with exercise. Acknowledge that pre and post exercise glucose checks will be done for 3 sessions at entry of program. Flowsheet Row Pulmonary Rehab from 08/19/2020 in [facility] Cardiac and Pulmonary Rehab  Date 08/10/20  Educator [facility]  Instruction Review Code 1- Verbalizes Understanding      Know Your Numbers and Heart Failure: - Group verbal and visual instruction to discuss disease risk factors for cardiac and pulmonary disease and treatment options.  Reviews associated critical values for Overweight/Obesity, Hypertension, Cholesterol, and Diabetes.  Discusses basics of heart failure: signs/symptoms and treatments.  Introduces Heart Failure Zone chart for action plan for heart failure.  Written material given at graduation.   Core Components/Risk Factors/Patient Goals Review:    Core Components/Risk Factors/Patient Goals at Discharge (Final Review):    ITP Comments:  ITP Comments    Row Name 08/06/20 1431 08/10/20 0945 08/17/20 0927 08/25/20 0713     ITP Comments Initial telephone orientation completed. Diagnosis can be found in [facility] 3/18. EP orientation scheduled for Tuesday 5/3 at 8am. Completed Monday and gym orientation. Initial ITP created and sent for review to Dr. , Medical Director. First full day of exercise!  Patient was oriented to gym and equipment including functions, settings, policies, and procedures.  Patient's individual exercise prescription and treatment plan were reviewed.  All starting workloads were established based on the results of the 6 minute walk test done at initial orientation visit.  The plan for exercise progression was also introduced and progression will be customized based on patient's performance and goals. 30 Day review completed. Medical Director ITP review done, changes made as directed, and signed approval by Medical Director.  New to program           Comments:

## 2020-09-02 ENCOUNTER — Other Ambulatory Visit: Payer: Self-pay

## 2020-09-02 DIAGNOSIS — J849 Interstitial pulmonary disease, unspecified: Secondary | ICD-10-CM

## 2020-09-02 NOTE — Progress Notes (Signed)
Daily Session Note  Patient Details  Name: Sierra Guzman MRN: [id number] Date of Birth: October 20, 1940 Referring Provider:   Flowsheet Row Pulmonary Rehab from 08/10/2020 in [facility] Cardiac and Pulmonary Rehab  Referring Provider Vonita Moss MD      Encounter Date: 09/02/2020  Check In:  Session Check In - 09/02/20 0920      Check-In   Supervising physician immediately available to respond to emergencies See telemetry face sheet for immediately available ER MD    Location [facility]-Cardiac & Pulmonary Rehab    Staff Present Birdie Sons, MPA, RN;Melissa Caiola RDN, Rowe Pavy, BA, ACSM CEP, Exercise Physiologist    Virtual Visit No    Medication changes reported     No    Fall or balance concerns reported    No    Warm-up and Cool-down Performed on first and last piece of equipment    Resistance Training Performed Yes    VAD Patient? No    PAD/SET Patient? No      Pain Assessment   Currently in Pain? No/denies              Social History   Tobacco Use  Smoking Status Former Smoker  . Packs/day: 0.25  . Years: 10.00  . Pack years: 2.50  . Types: Cigarettes  . Quit date: 08/18/1990  . Years since quitting: 30.0  Smokeless Tobacco Never Used    Goals Met:  Independence with exercise equipment Exercise tolerated well No report of cardiac concerns or symptoms Strength training completed today  Goals Unmet:  Not Applicable  Comments: Pt able to follow exercise prescription today without complaint.  Will continue to monitor for progression.    Dr. Emily Filbert is Medical Director for [place].  Dr. Ottie Glazier is Medical Director for [facility] Pulmonary Rehabilitation.

## 2020-09-09 ENCOUNTER — Encounter: Payer: Medicare HMO | Attending: Critical Care Medicine | Admitting: *Deleted

## 2020-09-09 ENCOUNTER — Other Ambulatory Visit: Payer: Self-pay

## 2020-09-09 DIAGNOSIS — J849 Interstitial pulmonary disease, unspecified: Secondary | ICD-10-CM | POA: Insufficient documentation

## 2020-09-09 NOTE — Progress Notes (Signed)
Daily Session Note  Patient Details  Name: Sierra Guzman MRN: [id number] Date of Birth: Nov 25, 1940 Referring Provider:   Flowsheet Row Pulmonary Rehab from 08/10/2020 in [facility] Cardiac and Pulmonary Rehab  Referring Provider Vonita Moss MD      Encounter Date: 09/09/2020  Check In:  Session Check In - 09/09/20 1143      Check-In   Supervising physician immediately available to respond to emergencies See telemetry face sheet for immediately available ER MD    Location [facility]-Cardiac & Pulmonary Rehab    Staff Present Heath Lark, RN, BSN, Laveda Norman, BS, ACSM CEP, Exercise Physiologist;Amanda Oletta Darter, [place], ACSM CEP, Exercise Physiologist    Virtual Visit No    Medication changes reported     No    Fall or balance concerns reported    No    Warm-up and Cool-down Performed on first and last piece of equipment    Resistance Training Performed Yes    VAD Patient? No    PAD/SET Patient? No      Pain Assessment   Currently in Pain? No/denies              Social History   Tobacco Use  Smoking Status Former Smoker  . Packs/day: 0.25  . Years: 10.00  . Pack years: 2.50  . Types: Cigarettes  . Quit date: 08/18/1990  . Years since quitting: 30.0  Smokeless Tobacco Never Used    Goals Met:  Proper associated with RPD/PD & O2 Sat Exercise tolerated well No report of cardiac concerns or symptoms  Goals Unmet:  Not Applicable  Comments: Pt able to follow exercise prescription today without complaint.  Will continue to monitor for progression.    Dr. Emily Filbert is Medical Director for [place].  Dr. Ottie Glazier is Medical Director for [facility] Pulmonary Rehabilitation.

## 2020-09-14 ENCOUNTER — Other Ambulatory Visit: Payer: Self-pay

## 2020-09-14 DIAGNOSIS — J849 Interstitial pulmonary disease, unspecified: Secondary | ICD-10-CM

## 2020-09-14 NOTE — Progress Notes (Signed)
Daily Session Note  Patient Details  Name: Sierra Guzman MRN: [id number] Date of Birth: 11/08/1940 Referring Provider:   Flowsheet Row Pulmonary Rehab from 08/10/2020 in [facility] Cardiac and Pulmonary Rehab  Referring Provider Vonita Moss MD      Encounter Date: 09/14/2020  Check In:  Session Check In - 09/14/20 0931      Check-In   Supervising physician immediately available to respond to emergencies See telemetry face sheet for immediately available ER MD    Location [facility]-Cardiac & Pulmonary Rehab    Staff Present Birdie Sons, MPA, Elveria Rising, BA, ACSM CEP, Exercise Physiologist;Kara Eliezer Bottom, MS, ASCM CEP, Exercise Physiologist    Virtual Visit No    Medication changes reported     No    Fall or balance concerns reported    No    Warm-up and Cool-down Performed on first and last piece of equipment    Resistance Training Performed Yes    VAD Patient? No    PAD/SET Patient? No      Pain Assessment   Currently in Pain? No/denies              Social History   Tobacco Use  Smoking Status Former Smoker  . Packs/day: 0.25  . Years: 10.00  . Pack years: 2.50  . Types: Cigarettes  . Quit date: 08/18/1990  . Years since quitting: 30.0  Smokeless Tobacco Never Used    Goals Met:  Independence with exercise equipment Exercise tolerated well No report of cardiac concerns or symptoms Strength training completed today  Goals Unmet:  Not Applicable  Comments: Pt able to follow exercise prescription today without complaint.  Will continue to monitor for progression.    Dr. Emily Filbert is Medical Director for [place].  Dr. Ottie Glazier is Medical Director for [facility] Pulmonary Rehabilitation.

## 2020-09-21 ENCOUNTER — Other Ambulatory Visit: Payer: Self-pay

## 2020-09-21 DIAGNOSIS — J849 Interstitial pulmonary disease, unspecified: Secondary | ICD-10-CM | POA: Diagnosis not present

## 2020-09-21 NOTE — Progress Notes (Signed)
Daily Session Note  Patient Details  Name: Sierra Guzman MRN: [id number] Date of Birth: 1940-10-19 Referring Provider:   Flowsheet Row Pulmonary Rehab from 08/10/2020 in [facility] Cardiac and Pulmonary Rehab  Referring Provider Vonita Moss MD       Encounter Date: 09/21/2020  Check In:  Session Check In - 09/21/20 0919       Check-In   Supervising physician immediately available to respond to emergencies See telemetry face sheet for immediately available ER MD    Location [facility]-Cardiac & Pulmonary Rehab    Staff Present Birdie Sons, MPA, RN;Laureen Owens Shark, BS, RRT, CPFT;Kara Eliezer Bottom, MS, ASCM CEP, Exercise Physiologist    Virtual Visit No    Medication changes reported     No    Fall or balance concerns reported    No    Warm-up and Cool-down Performed on first and last piece of equipment    Resistance Training Performed Yes    VAD Patient? No    PAD/SET Patient? No      Pain Assessment   Currently in Pain? No/denies                Social History   Tobacco Use  Smoking Status Former   Packs/day: 0.25   Years: 10.00   Pack years: 2.50   Types: Cigarettes   Quit date: 08/18/1990   Years since quitting: 30.1  Smokeless Tobacco Never    Goals Met:  Independence with exercise equipment Exercise tolerated well Personal goals reviewed No report of cardiac concerns or symptoms Strength training completed today  Goals Unmet:  Not Applicable  Comments: Pt able to follow exercise prescription today without complaint.  Will continue to monitor for progression.    Dr. Emily Filbert is Medical Director for [place].  Dr. Ottie Glazier is Medical Director for [facility] Pulmonary Rehabilitation.

## 2020-09-22 ENCOUNTER — Encounter: Payer: Self-pay | Admitting: *Deleted

## 2020-09-22 DIAGNOSIS — J849 Interstitial pulmonary disease, unspecified: Secondary | ICD-10-CM

## 2020-09-22 NOTE — Progress Notes (Signed)
Pulmonary Individual Treatment Plan  Patient Details  Name: Sierra Guzman MRN: [id number] Date of Birth: 1941-02-14 Referring Provider:   Flowsheet Row Pulmonary Rehab from 08/10/2020 in [facility] Cardiac and Pulmonary Rehab  Referring Provider Josph Macho MD       Initial Encounter Date:  Flowsheet Row Pulmonary Rehab from 08/10/2020 in [facility] Cardiac and Pulmonary Rehab  Date 08/10/20       Visit Diagnosis: ILD (interstitial lung disease) (HCC)  Patient's Home Medications on Admission:  Current Outpatient Medications:    acetaminophen (TYLENOL) 500 MG tablet, Take 2 tablets (1,000 mg total) by mouth every 6 (six) hours as needed for mild pain (or Fever >/= 101)., Disp: 30 tablet, Rfl: 0   albuterol (VENTOLIN HFA) 108 (90 Base) MCG/ACT inhaler, Inhale 1-2 puffs into the lungs every 6 (six) hours as needed for wheezing or shortness of breath., Disp: , Rfl:    allopurinol (ZYLOPRIM) 300 MG tablet, Take 300 mg by mouth daily. , Disp: , Rfl:    aspirin EC 81 MG tablet, Take 81 mg by mouth daily., Disp: , Rfl:    benzonatate (TESSALON) 200 MG capsule, Take 1 capsule (200 mg total) by mouth 3 (three) times daily as needed for cough., Disp: 30 capsule, Rfl: 0   bisacodyl (DULCOLAX) 10 MG suppository, Place 1 suppository (10 mg total) rectally daily as needed for moderate constipation. (Patient not taking: Reported on 08/06/2020), Disp: 12 suppository, Rfl: 0   Blood Glucose Monitoring Suppl (GLUCOCOM BLOOD GLUCOSE MONITOR) DEVI, See admin instructions., Disp: , Rfl:    candesartan (ATACAND) 32 MG tablet, Take by mouth., Disp: , Rfl:    cetirizine (ZYRTEC) 10 MG tablet, Take 10 mg by mouth at bedtime., Disp: , Rfl:    cholecalciferol (VITAMIN D) 1000 units tablet, Take 1,000 Units by mouth daily., Disp: , Rfl:    Cinnamon 500 MG capsule, Take 500 mg by mouth daily., Disp: , Rfl:    diphenhydrAMINE (BENADRYL) 25 mg capsule, Take 25 mg by mouth as needed for itching., Disp: , Rfl:    docusate sodium  (COLACE) 100 MG capsule, Take 1 capsule (100 mg total) by mouth 2 (two) times daily., Disp: 10 capsule, Rfl: 0   ESBRIET 267 MG TABS, , Disp: , Rfl:    Fluticasone-Salmeterol (ADVAIR) 500-50 MCG/DOSE AEPB, Inhale 1 puff into the lungs 2 (two) times daily. , Disp: , Rfl:    gabapentin (NEURONTIN) 300 MG capsule, TAKE 2 CAPSULES BY MOUTH TWICE A DAY AND TAKE 3 CAPS AT BEDTIME, Disp: , Rfl:    hydrochlorothiazide (HYDRODIURIL) 25 MG tablet, Take 25 mg by mouth daily. (Patient not taking: Reported on 08/06/2020), Disp: , Rfl:    lisinopril (PRINIVIL,ZESTRIL) 40 MG tablet, Take 40 mg by mouth daily. (Patient not taking: Reported on 08/06/2020), Disp: , Rfl:    loratadine (CLARITIN) 10 MG tablet, Take by mouth., Disp: , Rfl:    magnesium oxide 400 (240 Mg) MG TABS, Take by mouth., Disp: , Rfl:    metFORMIN (GLUCOPHAGE) 1000 MG tablet, Take 1,000 mg by mouth every evening. (Patient not taking: Reported on 08/06/2020), Disp: , Rfl:    metoprolol succinate (TOPROL-XL) 50 MG 24 hr tablet, Take 50 mg by mouth daily., Disp: , Rfl:    Multiple Vitamin (MULTI-VITAMIN) tablet, Take 1 tablet by mouth as needed., Disp: , Rfl:    Omega-3 Fatty Acids (FISH OIL) 1000 MG CAPS, Take 1 capsule by mouth daily. , Disp: , Rfl:    predniSONE (DELTASONE) 50 MG  tablet, 1 tablet daily x 5 days, Disp: 5 tablet, Rfl: 0   rosuvastatin (CRESTOR) 20 MG tablet, Take 20 mg by mouth daily., Disp: , Rfl:    vitamin B-12 (CYANOCOBALAMIN) 1000 MCG tablet, Take 1,000 mcg by mouth daily., Disp: , Rfl:   Past Medical History: Past Medical History:  Diagnosis Date   Anemia 10/28/2010   unspecified   Anxiety    Aortic valve sclerosis    Arthritis    Asthma    Chronic kidney disease    Chronic Kidney Disease---Stage III   Coronary artery disease 10/28/2010   DDD (degenerative disc disease), lumbar 2006   Diabetes mellitus without complication (HCC)    Diabetes type 2, controlled (HCC) 10/28/2010   Dyspnea    GERD (gastroesophageal  reflux disease)    Gout 10/28/2010   Heart abnormality    Heart murmur    History of cardiac catheterization 2006   LAD 50%, D1 50% rest normal.    History of cardiovascular stress test 09/23/2009    lexiscan Normal Cardiolite test, EF 61%. LV size and function - normal   History of echocardiogram 05/27/2008   EF 55%; Mild aortic insufficiency with minimal aortic sclerosis. L vent normal.    Hyperlipidemia    Hyperlipidemia LDL goal <100 10/28/2010   managed by Dr. Burr Medico   Hypertension 10/28/2010   Interstitial pneumonia (HCC)    unspecified   Mild vitamin D deficiency 02/22/2011   Obesity (BMI 30-39.9) 10/28/2010   unspecified   Osteoarthritis 10/28/2010   bilateral knees, left hip   Personal history of colonic polyps 10/28/2010   Shortness of breath    Syncope 2013   Vitamin B 12 deficiency 10/28/2010    Tobacco Use: Social History   Tobacco Use  Smoking Status Former   Packs/day: 0.25   Years: 10.00   Pack years: 2.50   Types: Cigarettes   Quit date: 08/18/1990   Years since quitting: 30.1  Smokeless Tobacco Never    Labs: Recent Review Flowsheet Data   There is no flowsheet data to display.      Pulmonary Assessment Scores:  Pulmonary Assessment Scores     Row Name 08/10/20 0959         ADL UCSD   ADL Phase Entry     SOB Score total 92     Rest 1     Walk 3     Stairs 5     Bath 4     Dress 3     Shop 5           CAT Score     CAT Score 23           mMRC Score     mMRC Score 2             UCSD: Self-administered rating of dyspnea associated with activities of daily living (ADLs) 6-point scale (0 = "not at all" to 5 = "maximal or unable to do because of breathlessness")  Scoring Scores range from 0 to 120.  Minimally important difference is 5 units  CAT: CAT can identify the health impairment of COPD patients and is better correlated with disease progression.  CAT has a scoring range of zero to 40. The CAT score is classified into  four groups of low (less than 10), medium (10 - 20), high (21-30) and very high (31-40) based on the impact level of disease on health status. A CAT score over 10 suggests significant symptoms.  A worsening CAT score could be explained by an exacerbation, poor medication adherence, poor inhaler technique, or progression of COPD or comorbid conditions.  CAT MCID is 2 points  mMRC: mMRC (Modified Medical Research Council) Dyspnea Scale is used to assess the degree of baseline functional disability in patients of respiratory disease due to dyspnea. No minimal important difference is established. A decrease in score of 1 point or greater is considered a positive change.   Pulmonary Function Assessment:  Pulmonary Function Assessment - 08/10/20 0958       Breath   Shortness of Breath Yes;Fear of Shortness of Breath;Limiting activity             Exercise Target Goals: Exercise Program Goal: Individual exercise prescription set using results from initial 6 min walk test and THRR while considering  patient's activity barriers and safety.   Exercise Prescription Goal: Initial exercise prescription builds to 30-45 minutes a day of aerobic activity, 2-3 days per week.  Home exercise guidelines will be given to patient during program as part of exercise prescription that the participant will acknowledge.  Education: Aerobic Exercise: - Group verbal and visual presentation on the components of exercise prescription. Introduces F.I.T.T principle from ACSM for exercise prescriptions.  Reviews F.I.T.T. principles of aerobic exercise including progression. Written material given at graduation. Flowsheet Row Pulmonary Rehab from 09/09/2020 in [facility] Cardiac and Pulmonary Rehab  Education need identified 08/10/20       Education: Resistance Exercise: - Group verbal and visual presentation on the components of exercise prescription. Introduces F.I.T.T principle from ACSM for exercise prescriptions   Reviews F.I.T.T. principles of resistance exercise including progression. Written material given at graduation.    Education: Exercise & Equipment Safety: - Individual verbal instruction and demonstration of equipment use and safety with use of the equipment. Flowsheet Row Pulmonary Rehab from 09/09/2020 in [facility] Cardiac and Pulmonary Rehab  Date 08/10/20  Educator [facility]  Instruction Review Code 1- Verbalizes Understanding       Education: Exercise Physiology & General Exercise Guidelines: - Group verbal and written instruction with models to review the exercise physiology of the cardiovascular system and associated critical values. Provides general exercise guidelines with specific guidelines to those with heart or lung disease.    Education: Flexibility, Balance, Mind/Body Relaxation: - Group verbal and visual presentation with interactive activity on the components of exercise prescription. Introduces F.I.T.T principle from ACSM for exercise prescriptions. Reviews F.I.T.T. principles of flexibility and balance exercise training including progression. Also discusses the mind body connection.  Reviews various relaxation techniques to help reduce and manage stress (i.e. Deep breathing, progressive muscle relaxation, and visualization). Balance handout provided to take home. Written material given at graduation.   Activity Barriers & Risk Stratification:  Activity Barriers & Cardiac Risk Stratification - 08/10/20 0948       Activity Barriers & Cardiac Risk Stratification   Activity Barriers Right Hip Replacement;Right Knee Replacement;Left Knee Replacement;Joint Problems;Left Hip Replacement;Back Problems;Shortness of Breath;Muscular Weakness;Deconditioning;Balance Concerns             6 Minute Walk:  6 Minute Walk     Row Name 08/10/20 0945         6 Minute Walk   Phase Initial     Distance 300 feet     Walk Time 2.45 minutes     # of Rest Breaks 1  then desated and test  terminated     MPH 1.39     METS 0.98     RPE  13     Perceived Dyspnea  3     VO2 Peak 3.43     Symptoms Yes (comment)     Comments SOB     Resting HR 84 bpm     Resting BP 146/74     Resting Oxygen Saturation  97 %     Exercise Oxygen Saturation  during 6 min walk 77 %     Max Ex. HR 111 bpm     Max Ex. BP 154/82     2 Minute Post BP 142/82           Interval HR     1 Minute HR 105     2 Minute HR 108     3 Minute HR 111     2 Minute Post HR 104     Interval Heart Rate? Yes           Interval Oxygen     Interval Oxygen? Yes     Baseline Oxygen Saturation % 97 %     1 Minute Oxygen Saturation % 86 %     1 Minute Liters of Oxygen 4 L  pulsed concentrator     2 Minute Oxygen Saturation % 82 %     2 Minute Liters of Oxygen 4 L     3 Minute Oxygen Saturation % 77 %     3 Minute Liters of Oxygen 4 L     2 Minute Post Oxygen Saturation % 88 %     2 Minute Post Liters of Oxygen 4 L            Oxygen Initial Assessment:  Oxygen Initial Assessment - 08/10/20 0959       Home Oxygen   Home Oxygen Device Home Concentrator;Portable Concentrator    Sleep Oxygen Prescription None    Home Exercise Oxygen Prescription Pulsed    Liters per minute 4    Home Resting Oxygen Prescription Pulsed    Liters per minute 2    Compliance with Home Oxygen Use Yes      Initial 6 min Walk   Oxygen Used Pulsed;Portable Concentrator    Liters per minute 4      Program Oxygen Prescription   Program Oxygen Prescription Continuous;E-Tanks    Liters per minute 4      Intervention   Short Term Goals To learn and understand importance of monitoring SPO2 with pulse oximeter and demonstrate accurate use of the pulse oximeter.;To learn and exhibit compliance with exercise, home and travel O2 prescription;To learn and understand importance of maintaining oxygen saturations>88%;To learn and demonstrate proper pursed lip breathing techniques or other breathing techniques. ;To learn and demonstrate  proper use of respiratory medications    Long  Term Goals Exhibits compliance with exercise, home  and travel O2 prescription;Verbalizes importance of monitoring SPO2 with pulse oximeter and return demonstration;Maintenance of O2 saturations>88%;Exhibits proper breathing techniques, such as pursed lip breathing or other method taught during program session;Compliance with respiratory medication;Demonstrates proper use of MDI's             Oxygen Re-Evaluation:  Oxygen Re-Evaluation     Row Name 08/17/20 0928 09/21/20 0937           Program Oxygen Prescription   Program Oxygen Prescription Continuous;E-Tanks Continuous;E-Tanks      Liters per minute 4 4             Home Oxygen      Home Oxygen Device Home Concentrator;Portable Concentrator  Home Concentrator;Portable Concentrator      Sleep Oxygen Prescription None None      Home Exercise Oxygen Prescription Pulsed Pulsed      Liters per minute 4 4      Home Resting Oxygen Prescription Pulsed Pulsed      Liters per minute 2 2  Se reports not using O2 when she is completely resting.      Compliance with Home Oxygen Use Yes No             Goals/Expected Outcomes      Short Term Goals To learn and understand importance of monitoring SPO2 with pulse oximeter and demonstrate accurate use of the pulse oximeter.;To learn and exhibit compliance with exercise, home and travel O2 prescription;To learn and understand importance of maintaining oxygen saturations>88%;To learn and demonstrate proper pursed lip breathing techniques or other breathing techniques.  To learn and understand importance of monitoring SPO2 with pulse oximeter and demonstrate accurate use of the pulse oximeter.;To learn and exhibit compliance with exercise, home and travel O2 prescription;To learn and understand importance of maintaining oxygen saturations>88%;To learn and demonstrate proper pursed lip breathing techniques or other breathing techniques.       Long  Term  Goals Exhibits compliance with exercise, home  and travel O2 prescription;Verbalizes importance of monitoring SPO2 with pulse oximeter and return demonstration;Maintenance of O2 saturations>88%;Exhibits proper breathing techniques, such as pursed lip breathing or other method taught during program session;Compliance with respiratory medication Exhibits compliance with exercise, home  and travel O2 prescription;Verbalizes importance of monitoring SPO2 with pulse oximeter and return demonstration;Maintenance of O2 saturations>88%;Exhibits proper breathing techniques, such as pursed lip breathing or other method taught during program session;Compliance with respiratory medication      Comments Reviewed PLB technique with pt.  Talked about how it works and it's importance in maintaining their exercise saturations. Reviewed PLB technique with pt, she continues to try and practice; she feels it has been helping. She reports her breathing is better some days and worse other days.      Goals/Expected Outcomes Short: Become more profiecient at using PLB.   Long: Become independent at using PLB. Short: Become more profiecient at using PLB.   Long: Become independent at using PLB.              Oxygen Discharge (Final Oxygen Re-Evaluation):  Oxygen Re-Evaluation - 09/21/20 0937       Program Oxygen Prescription   Program Oxygen Prescription Continuous;E-Tanks    Liters per minute 4      Home Oxygen   Home Oxygen Device Home Concentrator;Portable Concentrator    Sleep Oxygen Prescription None    Home Exercise Oxygen Prescription Pulsed    Liters per minute 4    Home Resting Oxygen Prescription Pulsed    Liters per minute 2   Se reports not using O2 when she is completely resting.   Compliance with Home Oxygen Use No      Goals/Expected Outcomes   Short Term Goals To learn and understand importance of monitoring SPO2 with pulse oximeter and demonstrate accurate use of the pulse oximeter.;To learn and  exhibit compliance with exercise, home and travel O2 prescription;To learn and understand importance of maintaining oxygen saturations>88%;To learn and demonstrate proper pursed lip breathing techniques or other breathing techniques.     Long  Term Goals Exhibits compliance with exercise, home  and travel O2 prescription;Verbalizes importance of monitoring SPO2 with pulse oximeter and return demonstration;Maintenance of O2 saturations>88%;Exhibits proper breathing techniques,  such as pursed lip breathing or other method taught during program session;Compliance with respiratory medication    Comments Reviewed PLB technique with pt, she continues to try and practice; she feels it has been helping. She reports her breathing is better some days and worse other days.    Goals/Expected Outcomes Short: Become more profiecient at using PLB.   Long: Become independent at using PLB.             Initial Exercise Prescription:  Initial Exercise Prescription - 08/10/20 0900       Date of Initial Exercise RX and Referring Provider   Date 08/10/20    Referring Provider Josph Macho MD      Oxygen   Oxygen Continuous    Liters 4      Arm Ergometer   Level 1    Watts 9    RPM 25    Minutes 15    METs 1      Biostep-RELP   Level 1    SPM 80    Minutes 15    METs 2      Track   Laps 15    Minutes 15    METs 1.8      Prescription Details   Frequency (times per week) 2    Duration Progress to 30 minutes of continuous aerobic without signs/symptoms of physical distress      Intensity   THRR 40-80% of Max Heartrate 106-129    Ratings of Perceived Exertion 11-13    Perceived Dyspnea 0-4      Progression   Progression Continue to progress workloads to maintain intensity without signs/symptoms of physical distress.      Resistance Training   Training Prescription Yes    Weight 3 lb    Reps 10-15             Perform Capillary Blood Glucose checks as needed.  Exercise  Prescription Changes:   Exercise Prescription Changes     Row Name 08/10/20 0900 08/17/20 1400 09/02/20 0800 09/15/20 1300       Response to Exercise   Blood Pressure (Admit) 146/74 106/62 108/66 120/66    Blood Pressure (Exercise) 154/82 118/60 138/68 118/62    Blood Pressure (Exit) 142/82 102/58 114/62 90/52    Heart Rate (Admit) 84 bpm 76 bpm 102 bpm 89 bpm    Heart Rate (Exercise) 111 bpm 104 bpm 119 bpm 101 bpm    Heart Rate (Exit) 104 bpm 80 bpm 99 bpm 88 bpm    Oxygen Saturation (Admit) 97 % 91 % 93 % 98 %    Oxygen Saturation (Exercise) 77 % 90 % 94 % 90 %    Oxygen Saturation (Exit) 88 % 91 % 93 % 96 %    Rating of Perceived Exertion (Exercise) Perceived Dyspnea (Exercise) Symptoms SOB SOB SOB SOB    Comments walk test results first day -- --    Duration -- -- Progress to 30 minutes of  aerobic without signs/symptoms of physical distress Progress to 30 minutes of  aerobic without signs/symptoms of physical distress    Intensity -- -- THRR unchanged THRR unchanged         Progression        Progression -- Continue to progress workloads to maintain intensity without signs/symptoms of physical distress. Continue to progress workloads to maintain intensity without signs/symptoms of physical distress. Continue to progress  workloads to maintain intensity without signs/symptoms of physical distress.    Average METs -- -- 1 1         Resistance Training        Training Prescription -- Yes Yes Yes    Weight -- 3 lb 3 lb 3 lb    Reps -- 10-15 10-15 10-15         Interval Training        Interval Training -- -- No No         Oxygen        Oxygen -- Continuous Continuous Continuous    Liters -- Arm Ergometer        Level -- 1 1 --    Minutes -- 15 15 --         Biostep-RELP        Level -- -- -- 1    Minutes -- -- -- 15    METs -- -- -- 1         Track        Laps -- 2 7 --    Minutes -- 15 15 --            Exercise  Comments:   Exercise Comments     Row Name 08/10/20 0954 08/17/20 0927         Exercise Comments Pt desaturated on walk test with pulsed concentrator.  Sent note to MD to up concentrator flow to 5L. First full day of exercise!  Patient was oriented to gym and equipment including functions, settings, policies, and procedures.  Patient's individual exercise prescription and treatment plan were reviewed.  All starting workloads were established based on the results of the 6 minute walk test done at initial orientation visit.  The plan for exercise progression was also introduced and progression will be customized based on patient's performance and goals.               Exercise Goals and Review:   Exercise Goals     Row Name 08/10/20 0953             Exercise Goals   Increase Physical Activity Yes       Intervention Provide advice, education, support and counseling about physical activity/exercise needs.;Develop an individualized exercise prescription for aerobic and resistive training based on initial evaluation findings, risk stratification, comorbidities and participant's personal goals.       Expected Outcomes Short Term: Attend rehab on a regular basis to increase amount of physical activity.;Long Term: Add in home exercise to make exercise part of routine and to increase amount of physical activity.;Long Term: Exercising regularly at least 3-5 days a week.       Increase Strength and Stamina Yes       Intervention Provide advice, education, support and counseling about physical activity/exercise needs.;Develop an individualized exercise prescription for aerobic and resistive training based on initial evaluation findings, risk stratification, comorbidities and participant's personal goals.       Expected Outcomes Short Term: Perform resistance training exercises routinely during rehab and add in resistance training at home;Short Term: Increase workloads from initial exercise  prescription for resistance, speed, and METs.;Long Term: Improve cardiorespiratory fitness, muscular endurance and strength as measured by increased METs and functional capacity ( )       Able to understand and use rate of perceived exertion (RPE) scale Yes       Intervention  Provide education and explanation on how to use RPE scale       Expected Outcomes Short Term: Able to use RPE daily in rehab to express subjective intensity level;Long Term:  Able to use RPE to guide intensity level when exercising independently       Able to understand and use Dyspnea scale Yes       Intervention Provide education and explanation on how to use Dyspnea scale       Expected Outcomes Short Term: Able to use Dyspnea scale daily in rehab to express subjective sense of shortness of breath during exertion;Long Term: Able to use Dyspnea scale to guide intensity level when exercising independently       Knowledge and understanding of Target Heart Rate Range (THRR) Yes       Intervention Provide education and explanation of THRR including how the numbers were predicted and where they are located for reference       Expected Outcomes Short Term: Able to state/look up THRR;Short Term: Able to use daily as guideline for intensity in rehab;Long Term: Able to use THRR to govern intensity when exercising independently       Able to check pulse independently Yes       Intervention Provide education and demonstration on how to check pulse in carotid and radial arteries.;Review the importance of being able to check your own pulse for safety during independent exercise       Expected Outcomes Short Term: Able to explain why pulse checking is important during independent exercise;Long Term: Able to check pulse independently and accurately       Understanding of Exercise Prescription Yes       Intervention Provide education, explanation, and written materials on patient's individual exercise prescription       Expected Outcomes  Short Term: Able to explain program exercise prescription;Long Term: Able to explain home exercise prescription to exercise independently                Exercise Goals Re-Evaluation :  Exercise Goals Re-Evaluation     Row Name 08/17/20 4098 09/02/20 0832 09/15/20 1335         Exercise Goal Re-Evaluation   Exercise Goals Review Increase Physical Activity;Able to understand and use rate of perceived exertion (RPE) scale;Knowledge and understanding of Target Heart Rate Range (THRR);Understanding of Exercise Prescription;Increase Strength and Stamina;Able to understand and use Dyspnea scale;Able to check pulse independently Increase Physical Activity;Increase Strength and Stamina;Understanding of Exercise Prescription Increase Physical Activity;Increase Strength and Stamina     Comments Reviewed RPE and dyspnea scales, THR and program prescription with pt today.  Pt voiced understanding and was given a copy of goals to take home. Rayssa is off to a good start in rehab.  She has completed her three full days of exercise.   She would benefit more from better attendance.  We will continue to monitor her progress. Saloni can do 7 laps walking on Track, resting as needed.  Her oxygen stays above 90% most of the time.  Staff instruct her to rest if it gets below 88%     Expected Outcomes Short: Use RPE daily to regulate intensity. Long: Follow program prescription in [facility]. Short: Regular attendance in rehab Long: Continue to follow program prescription Short: work up to 10 laps on track Long: inrease overall stamina              Discharge Exercise Prescription (Final Exercise Prescription Changes):  Exercise Prescription Changes - 09/15/20 1300  Response to Exercise   Blood Pressure (Admit) 120/66    Blood Pressure (Exercise) 118/62    Blood Pressure (Exit) 90/52    Heart Rate (Admit) 89 bpm    Heart Rate (Exercise) 101 bpm    Heart Rate (Exit) 88 bpm    Oxygen Saturation (Admit) 98 %     Oxygen Saturation (Exercise) 90 %    Oxygen Saturation (Exit) 96 %    Rating of Perceived Exertion (Exercise) 12    Perceived Dyspnea (Exercise) 2    Symptoms SOB    Duration Progress to 30 minutes of  aerobic without signs/symptoms of physical distress    Intensity THRR unchanged      Progression   Progression Continue to progress workloads to maintain intensity without signs/symptoms of physical distress.    Average METs 1      Resistance Training   Training Prescription Yes    Weight 3 lb    Reps 10-15      Interval Training   Interval Training No      Oxygen   Oxygen Continuous    Liters 4      Biostep-RELP   Level 1    Minutes 15    METs 1             Nutrition:  Target Goals: Understanding of nutrition guidelines, daily intake of sodium 1500mg , cholesterol 200mg , calories 30% from fat and 7% or less from saturated fats, daily to have 5 or more servings of fruits and vegetables.  Education: All About Nutrition: -Group instruction provided by verbal, written material, interactive activities, discussions, models, and posters to present general guidelines for heart healthy nutrition including fat, fiber, MyPlate, the role of sodium in heart healthy nutrition, utilization of the nutrition label, and utilization of this knowledge for meal planning. Follow up email sent as well. Written material given at graduation. Flowsheet Row Pulmonary Rehab from 09/09/2020 in [facility] Cardiac and Pulmonary Rehab  Date 08/19/20  Educator [facility]  Instruction Review Code 1- Verbalizes Understanding       Biometrics:  Pre Biometrics - 08/10/20 0955       Pre Biometrics   Height 5' 5.4" (1.661 m)    Weight 155 lb 14.4 oz (70.7 kg)    BMI (Calculated) 25.63    Single Leg Stand 0 seconds              Nutrition Therapy Plan and Nutrition Goals:  Nutrition Therapy & Goals - 08/10/20 0955       Intervention Plan   Intervention Prescribe, educate and counsel regarding  individualized specific dietary modifications aiming towards targeted core components such as weight, hypertension, lipid management, diabetes, heart failure and other comorbidities.    Expected Outcomes Short Term Goal: Understand basic principles of dietary content, such as calories, fat, sodium, cholesterol and nutrients.;Short Term Goal: A plan has been developed with personal nutrition goals set during dietitian appointment.;Long Term Goal: Adherence to prescribed nutrition plan.             Nutrition Assessments:  MEDIFICTS Score Key: ?70 Need to make dietary changes  40-70 Heart Healthy Diet ? 40 Therapeutic Level Cholesterol Diet  Flowsheet Row Pulmonary Rehab from 08/10/2020 in [facility] Cardiac and Pulmonary Rehab  Picture Your Plate Total Score on Admission 66      Picture Your Plate Scores: <78 Unhealthy dietary pattern with much room for improvement. 41-50 Dietary pattern unlikely to meet recommendations for good health and room for improvement. 51-60 More  healthful dietary pattern, with some room for improvement.  >60 Healthy dietary pattern, although there may be some specific behaviors that could be improved.   Nutrition Goals Re-Evaluation:  Nutrition Goals Re-Evaluation     Row Name 09/21/20 0915             Goals   Comment Unable to complete intial RD consultation. Rescheduled 3x. Will try to complete consultation sometime while patient is in class.                Nutrition Goals Discharge (Final Nutrition Goals Re-Evaluation):  Nutrition Goals Re-Evaluation - 09/21/20 0915       Goals   Comment Unable to complete intial RD consultation. Rescheduled 3x. Will try to complete consultation sometime while patient is in class.             Psychosocial: Target Goals: Acknowledge presence or absence of significant depression and/or stress, maximize coping skills, provide positive support system. Participant is able to verbalize types and ability to use  techniques and skills needed for reducing stress and depression.   Education: Stress, Anxiety, and Depression - Group verbal and visual presentation to define topics covered.  Reviews how body is impacted by stress, anxiety, and depression.  Also discusses healthy ways to reduce stress and to treat/manage anxiety and depression.  Written material given at graduation.   Education: Sleep Hygiene -Provides group verbal and written instruction about how sleep can affect your health.  Define sleep hygiene, discuss sleep cycles and impact of sleep habits. Review good sleep hygiene tips.    Initial Review & Psychosocial Screening:  Initial Psych Review & Screening - 08/06/20 1404       Initial Review   Current issues with Current Stress Concerns    Source of Stress Concerns Chronic Illness;Unable to participate in former interests or hobbies;Unable to perform yard/household activities      Family Dynamics   Good Support System? Yes   children     Barriers   Psychosocial barriers to participate in program There are no identifiable barriers or psychosocial needs.      Screening Interventions   Interventions Encouraged to exercise    Expected Outcomes Short Term goal: Utilizing psychosocial counselor, staff and physician to assist with identification of specific Stressors or current issues interfering with healing process. Setting desired goal for each stressor or current issue identified.;Long Term Goal: Stressors or current issues are controlled or eliminated.;Short Term goal: Identification and review with participant of any Quality of Life or Depression concerns found by scoring the questionnaire.;Long Term goal: The participant improves quality of Life and PHQ9 Scores as seen by post scores and/or verbalization of changes             Quality of Life Scores:  Scores of 19 and below usually indicate a poorer quality of life in these areas.  A difference of  2-3 points is a clinically  meaningful difference.  A difference of 2-3 points in the total score of the Quality of Life Index has been associated with significant improvement in overall quality of life, self-image, physical symptoms, and general health in studies assessing change in quality of life.  PHQ-9: Recent Review Flowsheet Data     Depression screen [facility] 2/9 09/14/2020 08/10/2020   Decreased Interest 0 0   Down, Depressed, Hopeless 0 0   PHQ - 2 Score 0 0   Altered sleeping 0 1   Tired, decreased energy 0 1   Change in appetite  1 3   Feeling bad or failure about yourself  0 2   Trouble concentrating 0 0   Moving slowly or fidgety/restless 0 1   Suicidal thoughts 0 0   PHQ-9 Score 1 8   Difficult doing work/chores Not difficult at all Not difficult at all      Interpretation of Total Score  Total Score Depression Severity:  1-4 = Minimal depression, 5-9 = Mild depression, 10-14 = Moderate depression, 15-19 = Moderately severe depression, 20-27 = Severe depression   Psychosocial Evaluation and Intervention:  Psychosocial Evaluation - 08/06/20 1426       Psychosocial Evaluation & Interventions   Interventions Encouraged to exercise with the program and follow exercise prescription    Comments Ms. Davy's main concern is her breathing and unintentional weight loss due to her medication. She has noticed stairs and long distance walking causes a lot of shortness of breath. She wants to maintain her independence for as long as possible so she is motivated to work on her stamina.  She states that she has been sleeping well and using her supplemental oxygen as needed during physical activity. She did state she wants to feel more comfortable using her oxygen and "lugging" it around. Her daughters are very supportive.    Expected Outcomes Short: attend pulmonary rehab for education and exercsie. Long: develop positive self care habits.    Continue Psychosocial Services  Follow up required by staff              Psychosocial Re-Evaluation:  Psychosocial Re-Evaluation     Row Name 09/21/20 [phone]             Psychosocial Re-Evaluation   Current issues with None Identified       Comments She reports sleeping wells ince her grandchildren are not in there with her. She reports no current stressors. She loves to paint to reliev stress. She relies on her children and neighbors for a support system.       Expected Outcomes ST: continue to paint LT: maintain positive attitude.       Interventions Encouraged to attend Pulmonary Rehabilitation for the exercise       Continue Psychosocial Services  Follow up required by staff                Psychosocial Discharge (Final Psychosocial Re-Evaluation):  Psychosocial Re-Evaluation - 09/21/20 0939       Psychosocial Re-Evaluation   Current issues with None Identified    Comments She reports sleeping wells ince her grandchildren are not in there with her. She reports no current stressors. She loves to paint to reliev stress. She relies on her children and neighbors for a support system.    Expected Outcomes ST: continue to paint LT: maintain positive attitude.    Interventions Encouraged to attend Pulmonary Rehabilitation for the exercise    Continue Psychosocial Services  Follow up required by staff             Education: Education Goals: Education classes will be provided on a weekly basis, covering required topics. Participant will state understanding/return demonstration of topics presented.  Learning Barriers/Preferences:  Learning Barriers/Preferences - 08/06/20 1429       Learning Barriers/Preferences   Learning Barriers None    Learning Preferences None             General Pulmonary Education Topics:  Infection Prevention: - Provides verbal and written material to individual with discussion of infection control including proper hand  washing and proper equipment cleaning during exercise session. Flowsheet Row Pulmonary  Rehab from 09/09/2020 in [facility] Cardiac and Pulmonary Rehab  Date 08/10/20  Educator [facility]  Instruction Review Code 1- Verbalizes Understanding       Falls Prevention: - Provides verbal and written material to individual with discussion of falls prevention and safety. Flowsheet Row Pulmonary Rehab from 09/09/2020 in [facility] Cardiac and Pulmonary Rehab  Date 08/10/20  Educator [facility]  Instruction Review Code 1- Verbalizes Understanding       Chronic Lung Disease Review: - Group verbal instruction with posters, models, PowerPoint presentations and videos,  to review new updates, new respiratory medications, new advancements in procedures and treatments. Providing information on websites and "800" numbers for continued self-education. Includes information about supplement oxygen, available portable oxygen systems, continuous and intermittent flow rates, oxygen safety, concentrators, and Medicare reimbursement for oxygen. Explanation of Pulmonary Drugs, including class, frequency, complications, importance of spacers, rinsing mouth after steroid MDI's, and proper cleaning methods for nebulizers. Review of basic lung anatomy and physiology related to function, structure, and complications of lung disease. Review of risk factors. Discussion about methods for diagnosing sleep apnea and types of masks and machines for OSA. Includes a review of the use of types of environmental controls: home humidity, furnaces, filters, dust mite/pet prevention, HEPA vacuums. Discussion about weather changes, air quality and the benefits of nasal washing. Instruction on Warning signs, infection symptoms, calling MD promptly, preventive modes, and value of vaccinations. Review of effective airway clearance, coughing and/or vibration techniques. Emphasizing that all should Create an Action Plan. Written material given at graduation. Flowsheet Row Pulmonary Rehab from 09/09/2020 in [facility] Cardiac and Pulmonary Rehab  Education need identified  08/10/20  Date 09/09/20  Educator [facility]  Instruction Review Code 1- Verbalizes Understanding       AED/CPR: - Group verbal and written instruction with the use of models to demonstrate the basic use of the AED with the basic ABC's of resuscitation.    Anatomy and Cardiac Procedures: - Group verbal and visual presentation and models provide information about basic cardiac anatomy and function. Reviews the testing methods done to diagnose heart disease and the outcomes of the test results. Describes the treatment choices: Medical Management, Angioplasty, or Coronary Bypass Surgery for treating various heart conditions including Myocardial Infarction, Angina, Valve Disease, and Cardiac Arrhythmias.  Written material given at graduation.   Medication Safety: - Group verbal and visual instruction to review commonly prescribed medications for heart and lung disease. Reviews the medication, class of the drug, and side effects. Includes the steps to properly store meds and maintain the prescription regimen.  Written material given at graduation.   Other: -Provides group and verbal instruction on various topics (see comments)   Knowledge Questionnaire Score:  Knowledge Questionnaire Score - 08/10/20 0955       Knowledge Questionnaire Score   Pre Score 13/18 education focus: O2 safety, exercise              Core Components/Risk Factors/Patient Goals at Admission:  Personal Goals and Risk Factors at Admission - 08/10/20 0956       Core Components/Risk Factors/Patient Goals on Admission    Weight Management Yes;Weight Loss    Intervention Weight Management: Develop a combined nutrition and exercise program designed to reach desired caloric intake, while maintaining appropriate intake of nutrient and fiber, sodium and fats, and appropriate energy expenditure required for the weight goal.;Weight Management: Provide education and appropriate resources to help participant work on and  attain  dietary goals.;Weight Management/Obesity: Establish reasonable short term and long term weight goals.    Admit Weight 155 lb 14.4 oz (70.7 kg)    Goal Weight: Short Term 153 lb (69.4 kg)    Goal Weight: Long Term 150 lb (68 kg)    Expected Outcomes Short Term: Continue to assess and modify interventions until short term weight is achieved;Long Term: Adherence to nutrition and physical activity/exercise program aimed toward attainment of established weight goal;Understanding recommendations for meals to include 15-35% energy as protein, 25-35% energy from fat, 35-60% energy from carbohydrates, less than  of dietary cholesterol, 20-35 gm of total fiber daily;Weight Loss: Understanding of general recommendations for a balanced deficit meal plan, which promotes 1-2 lb weight loss per week and includes a negative energy balance of [phone] kcal/d;Understanding of distribution of calorie intake throughout the day with the consumption of 4-5 meals/snacks    Improve shortness of breath with ADL's Yes    Intervention Provide education, individualized exercise plan and daily activity instruction to help decrease symptoms of SOB with activities of daily living.    Expected Outcomes Short Term: Improve cardiorespiratory fitness to achieve a reduction of symptoms when performing ADLs;Long Term: Be able to perform more ADLs without symptoms or delay the onset of symptoms    Hypertension Yes    Intervention Provide education on lifestyle modifcations including regular physical activity/exercise, weight management, moderate sodium restriction and increased consumption of fresh fruit, vegetables, and low fat dairy, alcohol moderation, and smoking cessation.;Monitor prescription use compliance.    Expected Outcomes Short Term: Continued assessment and intervention until BP is < 140/63mm HG in hypertensive participants. < 130/36mm HG in hypertensive participants with diabetes, heart failure or chronic kidney  disease.;Long Term: Maintenance of blood pressure at goal levels.    Lipids Yes    Intervention Provide education and support for participant on nutrition & aerobic/resistive exercise along with prescribed medications to achieve LDL 70mg , HDL >40mg .    Expected Outcomes Short Term: Participant states understanding of desired cholesterol values and is compliant with medications prescribed. Participant is following exercise prescription and nutrition guidelines.;Long Term: Cholesterol controlled with medications as prescribed, with individualized exercise RX and with personalized nutrition plan. Value goals: LDL < , HDL > 40 mg.             Education:Diabetes - Individual verbal and written instruction to review signs/symptoms of diabetes, desired ranges of glucose level fasting, after meals and with exercise. Acknowledge that pre and post exercise glucose checks will be done for 3 sessions at entry of program. Flowsheet Row Pulmonary Rehab from 09/09/2020 in [facility] Cardiac and Pulmonary Rehab  Date 08/10/20  Educator [facility]  Instruction Review Code 1- Verbalizes Understanding       Know Your Numbers and Heart Failure: - Group verbal and visual instruction to discuss disease risk factors for cardiac and pulmonary disease and treatment options.  Reviews associated critical values for Overweight/Obesity, Hypertension, Cholesterol, and Diabetes.  Discusses basics of heart failure: signs/symptoms and treatments.  Introduces Heart Failure Zone chart for action plan for heart failure.  Written material given at graduation.   Core Components/Risk Factors/Patient Goals Review:   Goals and Risk Factor Review     Row Name 09/21/20 0916             Core Components/Risk Factors/Patient Goals Review   Personal Goals Review Hypertension;Diabetes;Improve shortness of breath with ADL's;Develop more efficient breathing techniques such as purse lipped breathing and diaphragmatic breathing and practicing  self-pacing with activity.       Review Has not spoken to RD yet due to rescheduling. She does not check her BG because her doctor told her she does not have diabetes anymore and she stopped her medicaitons. BP today was 120/70. She checks her BP at home - normally 110-120/60-70. She reports her breathing is good some days, but bad other days. She practices PLB and reports doing better - she feels it helps.       Expected Outcomes ST: continue to practice PLB and attend rehab. LT: monitor risk factors                Core Components/Risk Factors/Patient Goals at Discharge (Final Review):   Goals and Risk Factor Review - 09/21/20 0916       Core Components/Risk Factors/Patient Goals Review   Personal Goals Review Hypertension;Diabetes;Improve shortness of breath with ADL's;Develop more efficient breathing techniques such as purse lipped breathing and diaphragmatic breathing and practicing self-pacing with activity.    Review Has not spoken to RD yet due to rescheduling. She does not check her BG because her doctor told her she does not have diabetes anymore and she stopped her medicaitons. BP today was 120/70. She checks her BP at home - normally 110-120/60-70. She reports her breathing is good some days, but bad other days. She practices PLB and reports doing better - she feels it helps.    Expected Outcomes ST: continue to practice PLB and attend rehab. LT: monitor risk factors             ITP Comments:  ITP Comments     Row Name 08/06/20 1431 08/10/20 0945 08/17/20 0927 08/25/20 0713 09/22/20 0810   ITP Comments Initial telephone orientation completed. Diagnosis can be found in [facility]. EP orientation scheduled for Tuesday 5/3 at 8am. Completed and gym orientation. Initial ITP created and sent for review to Dr. Bethann Punches, Medical Director. First full day of exercise!  Patient was oriented to gym and equipment including functions, settings, policies, and procedures.  Patient's  individual exercise prescription and treatment plan were reviewed.  All starting workloads were established based on the results of the 6 minute walk test done at initial orientation visit.  The plan for exercise progression was also introduced and progression will be customized based on patient's performance and goals. 30 Day review completed. Medical Director ITP review done, changes made as directed, and signed approval by Medical Director.  New to program 30 Day review completed. Medical Director ITP review done, changes made as directed, and signed approval by Medical Director.            Comments:

## 2020-09-28 ENCOUNTER — Encounter: Payer: Self-pay | Admitting: *Deleted

## 2020-09-28 DIAGNOSIS — J849 Interstitial pulmonary disease, unspecified: Secondary | ICD-10-CM

## 2020-09-28 NOTE — Progress Notes (Signed)
Pulmonary Individual Treatment Plan  Patient Details  Name: GERICA KOBLE MRN: [id number] Date of Birth: 1941-02-14 Referring Provider:   Flowsheet Row Pulmonary Rehab from 08/10/2020 in [facility] Cardiac and Pulmonary Rehab  Referring Provider Josph Macho MD       Initial Encounter Date:  Flowsheet Row Pulmonary Rehab from 08/10/2020 in [facility] Cardiac and Pulmonary Rehab  Date 08/10/20       Visit Diagnosis: ILD (interstitial lung disease) (HCC)  Patient's Home Medications on Admission:  Current Outpatient Medications:    acetaminophen (TYLENOL) 500 MG tablet, Take 2 tablets (1,000 mg total) by mouth every 6 (six) hours as needed for mild pain (or Fever >/= 101)., Disp: 30 tablet, Rfl: 0   albuterol (VENTOLIN HFA) 108 (90 Base) MCG/ACT inhaler, Inhale 1-2 puffs into the lungs every 6 (six) hours as needed for wheezing or shortness of breath., Disp: , Rfl:    allopurinol (ZYLOPRIM) 300 MG tablet, Take 300 mg by mouth daily. , Disp: , Rfl:    aspirin EC 81 MG tablet, Take 81 mg by mouth daily., Disp: , Rfl:    benzonatate (TESSALON) 200 MG capsule, Take 1 capsule (200 mg total) by mouth 3 (three) times daily as needed for cough., Disp: 30 capsule, Rfl: 0   bisacodyl (DULCOLAX) 10 MG suppository, Place 1 suppository (10 mg total) rectally daily as needed for moderate constipation. (Patient not taking: Reported on 08/06/2020), Disp: 12 suppository, Rfl: 0   Blood Glucose Monitoring Suppl (GLUCOCOM BLOOD GLUCOSE MONITOR) DEVI, See admin instructions., Disp: , Rfl:    candesartan (ATACAND) 32 MG tablet, Take by mouth., Disp: , Rfl:    cetirizine (ZYRTEC) 10 MG tablet, Take 10 mg by mouth at bedtime., Disp: , Rfl:    cholecalciferol (VITAMIN D) 1000 units tablet, Take 1,000 Units by mouth daily., Disp: , Rfl:    Cinnamon 500 MG capsule, Take 500 mg by mouth daily., Disp: , Rfl:    diphenhydrAMINE (BENADRYL) 25 mg capsule, Take 25 mg by mouth as needed for itching., Disp: , Rfl:    docusate sodium  (COLACE) 100 MG capsule, Take 1 capsule (100 mg total) by mouth 2 (two) times daily., Disp: 10 capsule, Rfl: 0   ESBRIET 267 MG TABS, , Disp: , Rfl:    Fluticasone-Salmeterol (ADVAIR) 500-50 MCG/DOSE AEPB, Inhale 1 puff into the lungs 2 (two) times daily. , Disp: , Rfl:    gabapentin (NEURONTIN) 300 MG capsule, TAKE 2 CAPSULES BY MOUTH TWICE A DAY AND TAKE 3 CAPS AT BEDTIME, Disp: , Rfl:    hydrochlorothiazide (HYDRODIURIL) 25 MG tablet, Take 25 mg by mouth daily. (Patient not taking: Reported on 08/06/2020), Disp: , Rfl:    lisinopril (PRINIVIL,ZESTRIL) 40 MG tablet, Take 40 mg by mouth daily. (Patient not taking: Reported on 08/06/2020), Disp: , Rfl:    loratadine (CLARITIN) 10 MG tablet, Take by mouth., Disp: , Rfl:    magnesium oxide 400 (240 Mg) MG TABS, Take by mouth., Disp: , Rfl:    metFORMIN (GLUCOPHAGE) 1000 MG tablet, Take 1,000 mg by mouth every evening. (Patient not taking: Reported on 08/06/2020), Disp: , Rfl:    metoprolol succinate (TOPROL-XL) 50 MG 24 hr tablet, Take 50 mg by mouth daily., Disp: , Rfl:    Multiple Vitamin (MULTI-VITAMIN) tablet, Take 1 tablet by mouth as needed., Disp: , Rfl:    Omega-3 Fatty Acids (FISH OIL) 1000 MG CAPS, Take 1 capsule by mouth daily. , Disp: , Rfl:    predniSONE (DELTASONE) 50 MG  tablet, 1 tablet daily x 5 days, Disp: 5 tablet, Rfl: 0   rosuvastatin (CRESTOR) 20 MG tablet, Take 20 mg by mouth daily., Disp: , Rfl:    vitamin B-12 (CYANOCOBALAMIN) 1000 MCG tablet, Take 1,000 mcg by mouth daily., Disp: , Rfl:   Past Medical History: Past Medical History:  Diagnosis Date   Anemia 10/28/2010   unspecified   Anxiety    Aortic valve sclerosis    Arthritis    Asthma    Chronic kidney disease    Chronic Kidney Disease---Stage III   Coronary artery disease 10/28/2010   DDD (degenerative disc disease), lumbar 2006   Diabetes mellitus without complication (HCC)    Diabetes type 2, controlled (HCC) 10/28/2010   Dyspnea    GERD (gastroesophageal  reflux disease)    Gout 10/28/2010   Heart abnormality    Heart murmur    History of cardiac catheterization 2006   LAD 50%, D1 50% rest normal.    History of cardiovascular stress test 09/23/2009    lexiscan Normal Cardiolite test, EF 61%. LV size and function - normal   History of echocardiogram 05/27/2008   EF 55%; Mild aortic insufficiency with minimal aortic sclerosis. L vent normal.    Hyperlipidemia    Hyperlipidemia LDL goal <100 10/28/2010   managed by Dr. Burr Medico   Hypertension 10/28/2010   Interstitial pneumonia (HCC)    unspecified   Mild vitamin D deficiency 02/22/2011   Obesity (BMI 30-39.9) 10/28/2010   unspecified   Osteoarthritis 10/28/2010   bilateral knees, left hip   Personal history of colonic polyps 10/28/2010   Shortness of breath    Syncope 2013   Vitamin B 12 deficiency 10/28/2010    Tobacco Use: Social History   Tobacco Use  Smoking Status Former   Packs/day: 0.25   Years: 10.00   Pack years: 2.50   Types: Cigarettes   Quit date: 08/18/1990   Years since quitting: 30.1  Smokeless Tobacco Never    Labs: Recent Review Flowsheet Data   There is no flowsheet data to display.      Pulmonary Assessment Scores:  Pulmonary Assessment Scores     Row Name 08/10/20 0959         ADL UCSD   ADL Phase Entry     SOB Score total 92     Rest 1     Walk 3     Stairs 5     Bath 4     Dress 3     Shop 5           CAT Score     CAT Score 23           mMRC Score     mMRC Score 2             UCSD: Self-administered rating of dyspnea associated with activities of daily living (ADLs) 6-point scale (0 = "not at all" to 5 = "maximal or unable to do because of breathlessness")  Scoring Scores range from 0 to 120.  Minimally important difference is 5 units  CAT: CAT can identify the health impairment of COPD patients and is better correlated with disease progression.  CAT has a scoring range of zero to 40. The CAT score is classified into  four groups of low (less than 10), medium (10 - 20), high (21-30) and very high (31-40) based on the impact level of disease on health status. A CAT score over 10 suggests significant symptoms.  A worsening CAT score could be explained by an exacerbation, poor medication adherence, poor inhaler technique, or progression of COPD or comorbid conditions.  CAT MCID is 2 points  mMRC: mMRC (Modified Medical Research Council) Dyspnea Scale is used to assess the degree of baseline functional disability in patients of respiratory disease due to dyspnea. No minimal important difference is established. A decrease in score of 1 point or greater is considered a positive change.   Pulmonary Function Assessment:  Pulmonary Function Assessment - 08/10/20 0958       Breath   Shortness of Breath Yes;Fear of Shortness of Breath;Limiting activity             Exercise Target Goals: Exercise Program Goal: Individual exercise prescription set using results from initial 6 min walk test and THRR while considering  patient's activity barriers and safety.   Exercise Prescription Goal: Initial exercise prescription builds to 30-45 minutes a day of aerobic activity, 2-3 days per week.  Home exercise guidelines will be given to patient during program as part of exercise prescription that the participant will acknowledge.  Education: Aerobic Exercise: - Group verbal and visual presentation on the components of exercise prescription. Introduces F.I.T.T principle from ACSM for exercise prescriptions.  Reviews F.I.T.T. principles of aerobic exercise including progression. Written material given at graduation. Flowsheet Row Pulmonary Rehab from 09/09/2020 in [facility] Cardiac and Pulmonary Rehab  Education need identified 08/10/20       Education: Resistance Exercise: - Group verbal and visual presentation on the components of exercise prescription. Introduces F.I.T.T principle from ACSM for exercise prescriptions   Reviews F.I.T.T. principles of resistance exercise including progression. Written material given at graduation.    Education: Exercise & Equipment Safety: - Individual verbal instruction and demonstration of equipment use and safety with use of the equipment. Flowsheet Row Pulmonary Rehab from 09/09/2020 in [facility] Cardiac and Pulmonary Rehab  Date 08/10/20  Educator [facility]  Instruction Review Code 1- Verbalizes Understanding       Education: Exercise Physiology & General Exercise Guidelines: - Group verbal and written instruction with models to review the exercise physiology of the cardiovascular system and associated critical values. Provides general exercise guidelines with specific guidelines to those with heart or lung disease.    Education: Flexibility, Balance, Mind/Body Relaxation: - Group verbal and visual presentation with interactive activity on the components of exercise prescription. Introduces F.I.T.T principle from ACSM for exercise prescriptions. Reviews F.I.T.T. principles of flexibility and balance exercise training including progression. Also discusses the mind body connection.  Reviews various relaxation techniques to help reduce and manage stress (i.e. Deep breathing, progressive muscle relaxation, and visualization). Balance handout provided to take home. Written material given at graduation.   Activity Barriers & Risk Stratification:  Activity Barriers & Cardiac Risk Stratification - 08/10/20 0948       Activity Barriers & Cardiac Risk Stratification   Activity Barriers Right Hip Replacement;Right Knee Replacement;Left Knee Replacement;Joint Problems;Left Hip Replacement;Back Problems;Shortness of Breath;Muscular Weakness;Deconditioning;Balance Concerns             6 Minute Walk:  6 Minute Walk     Row Name 08/10/20 0945         6 Minute Walk   Phase Initial     Distance 300 feet     Walk Time 2.45 minutes     # of Rest Breaks 1  then desated and test  terminated     MPH 1.39     METS 0.98     RPE  13     Perceived Dyspnea  3     VO2 Peak 3.43     Symptoms Yes (comment)     Comments SOB     Resting HR 84 bpm     Resting BP 146/74     Resting Oxygen Saturation  97 %     Exercise Oxygen Saturation  during 6 min walk 77 %     Max Ex. HR 111 bpm     Max Ex. BP 154/82     2 Minute Post BP 142/82           Interval HR     1 Minute HR 105     2 Minute HR 108     3 Minute HR 111     2 Minute Post HR 104     Interval Heart Rate? Yes           Interval Oxygen     Interval Oxygen? Yes     Baseline Oxygen Saturation % 97 %     1 Minute Oxygen Saturation % 86 %     1 Minute Liters of Oxygen 4 L  pulsed concentrator     2 Minute Oxygen Saturation % 82 %     2 Minute Liters of Oxygen 4 L     3 Minute Oxygen Saturation % 77 %     3 Minute Liters of Oxygen 4 L     2 Minute Post Oxygen Saturation % 88 %     2 Minute Post Liters of Oxygen 4 L            Oxygen Initial Assessment:  Oxygen Initial Assessment - 08/10/20 0959       Home Oxygen   Home Oxygen Device Home Concentrator;Portable Concentrator    Sleep Oxygen Prescription None    Home Exercise Oxygen Prescription Pulsed    Liters per minute 4    Home Resting Oxygen Prescription Pulsed    Liters per minute 2    Compliance with Home Oxygen Use Yes      Initial 6 min Walk   Oxygen Used Pulsed;Portable Concentrator    Liters per minute 4      Program Oxygen Prescription   Program Oxygen Prescription Continuous;E-Tanks    Liters per minute 4      Intervention   Short Term Goals To learn and understand importance of monitoring SPO2 with pulse oximeter and demonstrate accurate use of the pulse oximeter.;To learn and exhibit compliance with exercise, home and travel O2 prescription;To learn and understand importance of maintaining oxygen saturations>88%;To learn and demonstrate proper pursed lip breathing techniques or other breathing techniques. ;To learn and demonstrate  proper use of respiratory medications    Long  Term Goals Exhibits compliance with exercise, home  and travel O2 prescription;Verbalizes importance of monitoring SPO2 with pulse oximeter and return demonstration;Maintenance of O2 saturations>88%;Exhibits proper breathing techniques, such as pursed lip breathing or other method taught during program session;Compliance with respiratory medication;Demonstrates proper use of MDI's             Oxygen Re-Evaluation:  Oxygen Re-Evaluation     Row Name 08/17/20 0928 09/21/20 0937           Program Oxygen Prescription   Program Oxygen Prescription Continuous;E-Tanks Continuous;E-Tanks      Liters per minute 4 4             Home Oxygen      Home Oxygen Device Home Concentrator;Portable Concentrator  Home Concentrator;Portable Concentrator      Sleep Oxygen Prescription None None      Home Exercise Oxygen Prescription Pulsed Pulsed      Liters per minute 4 4      Home Resting Oxygen Prescription Pulsed Pulsed      Liters per minute 2 2  Se reports not using O2 when she is completely resting.      Compliance with Home Oxygen Use Yes No             Goals/Expected Outcomes      Short Term Goals To learn and understand importance of monitoring SPO2 with pulse oximeter and demonstrate accurate use of the pulse oximeter.;To learn and exhibit compliance with exercise, home and travel O2 prescription;To learn and understand importance of maintaining oxygen saturations>88%;To learn and demonstrate proper pursed lip breathing techniques or other breathing techniques.  To learn and understand importance of monitoring SPO2 with pulse oximeter and demonstrate accurate use of the pulse oximeter.;To learn and exhibit compliance with exercise, home and travel O2 prescription;To learn and understand importance of maintaining oxygen saturations>88%;To learn and demonstrate proper pursed lip breathing techniques or other breathing techniques.       Long  Term  Goals Exhibits compliance with exercise, home  and travel O2 prescription;Verbalizes importance of monitoring SPO2 with pulse oximeter and return demonstration;Maintenance of O2 saturations>88%;Exhibits proper breathing techniques, such as pursed lip breathing or other method taught during program session;Compliance with respiratory medication Exhibits compliance with exercise, home  and travel O2 prescription;Verbalizes importance of monitoring SPO2 with pulse oximeter and return demonstration;Maintenance of O2 saturations>88%;Exhibits proper breathing techniques, such as pursed lip breathing or other method taught during program session;Compliance with respiratory medication      Comments Reviewed PLB technique with pt.  Talked about how it works and it's importance in maintaining their exercise saturations. Reviewed PLB technique with pt, she continues to try and practice; she feels it has been helping. She reports her breathing is better some days and worse other days.      Goals/Expected Outcomes Short: Become more profiecient at using PLB.   Long: Become independent at using PLB. Short: Become more profiecient at using PLB.   Long: Become independent at using PLB.              Oxygen Discharge (Final Oxygen Re-Evaluation):  Oxygen Re-Evaluation - 09/21/20 0937       Program Oxygen Prescription   Program Oxygen Prescription Continuous;E-Tanks    Liters per minute 4      Home Oxygen   Home Oxygen Device Home Concentrator;Portable Concentrator    Sleep Oxygen Prescription None    Home Exercise Oxygen Prescription Pulsed    Liters per minute 4    Home Resting Oxygen Prescription Pulsed    Liters per minute 2   Se reports not using O2 when she is completely resting.   Compliance with Home Oxygen Use No      Goals/Expected Outcomes   Short Term Goals To learn and understand importance of monitoring SPO2 with pulse oximeter and demonstrate accurate use of the pulse oximeter.;To learn and  exhibit compliance with exercise, home and travel O2 prescription;To learn and understand importance of maintaining oxygen saturations>88%;To learn and demonstrate proper pursed lip breathing techniques or other breathing techniques.     Long  Term Goals Exhibits compliance with exercise, home  and travel O2 prescription;Verbalizes importance of monitoring SPO2 with pulse oximeter and return demonstration;Maintenance of O2 saturations>88%;Exhibits proper breathing techniques,  such as pursed lip breathing or other method taught during program session;Compliance with respiratory medication    Comments Reviewed PLB technique with pt, she continues to try and practice; she feels it has been helping. She reports her breathing is better some days and worse other days.    Goals/Expected Outcomes Short: Become more profiecient at using PLB.   Long: Become independent at using PLB.             Initial Exercise Prescription:  Initial Exercise Prescription - 08/10/20 0900       Date of Initial Exercise RX and Referring Provider   Date 08/10/20    Referring Provider Josph Macho MD      Oxygen   Oxygen Continuous    Liters 4      Arm Ergometer   Level 1    Watts 9    RPM 25    Minutes 15    METs 1      Biostep-RELP   Level 1    SPM 80    Minutes 15    METs 2      Track   Laps 15    Minutes 15    METs 1.8      Prescription Details   Frequency (times per week) 2    Duration Progress to 30 minutes of continuous aerobic without signs/symptoms of physical distress      Intensity   THRR 40-80% of Max Heartrate 106-129    Ratings of Perceived Exertion 11-13    Perceived Dyspnea 0-4      Progression   Progression Continue to progress workloads to maintain intensity without signs/symptoms of physical distress.      Resistance Training   Training Prescription Yes    Weight 3 lb    Reps 10-15             Perform Capillary Blood Glucose checks as needed.  Exercise  Prescription Changes:   Exercise Prescription Changes     Row Name 08/10/20 0900 08/17/20 1400 09/02/20 0800 09/15/20 1300       Response to Exercise   Blood Pressure (Admit) 146/74 106/62 108/66 120/66    Blood Pressure (Exercise) 154/82 118/60 138/68 118/62    Blood Pressure (Exit) 142/82 102/58 114/62 90/52    Heart Rate (Admit) 84 bpm 76 bpm 102 bpm 89 bpm    Heart Rate (Exercise) 111 bpm 104 bpm 119 bpm 101 bpm    Heart Rate (Exit) 104 bpm 80 bpm 99 bpm 88 bpm    Oxygen Saturation (Admit) 97 % 91 % 93 % 98 %    Oxygen Saturation (Exercise) 77 % 90 % 94 % 90 %    Oxygen Saturation (Exit) 88 % 91 % 93 % 96 %    Rating of Perceived Exertion (Exercise) Perceived Dyspnea (Exercise) Symptoms SOB SOB SOB SOB    Comments walk test results first day -- --    Duration -- -- Progress to 30 minutes of  aerobic without signs/symptoms of physical distress Progress to 30 minutes of  aerobic without signs/symptoms of physical distress    Intensity -- -- THRR unchanged THRR unchanged         Progression        Progression -- Continue to progress workloads to maintain intensity without signs/symptoms of physical distress. Continue to progress workloads to maintain intensity without signs/symptoms of physical distress. Continue to progress  workloads to maintain intensity without signs/symptoms of physical distress.    Average METs -- -- 1 1         Resistance Training        Training Prescription -- Yes Yes Yes    Weight -- 3 lb 3 lb 3 lb    Reps -- 10-15 10-15 10-15         Interval Training        Interval Training -- -- No No         Oxygen        Oxygen -- Continuous Continuous Continuous    Liters -- Arm Ergometer        Level -- 1 1 --    Minutes -- 15 15 --         Biostep-RELP        Level -- -- -- 1    Minutes -- -- -- 15    METs -- -- -- 1         Track        Laps -- 2 7 --    Minutes -- 15 15 --            Exercise  Comments:   Exercise Comments     Row Name 08/10/20 0954 08/17/20 0927         Exercise Comments Pt desaturated on walk test with pulsed concentrator.  Sent note to MD to up concentrator flow to 5L. First full day of exercise!  Patient was oriented to gym and equipment including functions, settings, policies, and procedures.  Patient's individual exercise prescription and treatment plan were reviewed.  All starting workloads were established based on the results of the 6 minute walk test done at initial orientation visit.  The plan for exercise progression was also introduced and progression will be customized based on patient's performance and goals.               Exercise Goals and Review:   Exercise Goals     Row Name 08/10/20 0953             Exercise Goals   Increase Physical Activity Yes       Intervention Provide advice, education, support and counseling about physical activity/exercise needs.;Develop an individualized exercise prescription for aerobic and resistive training based on initial evaluation findings, risk stratification, comorbidities and participant's personal goals.       Expected Outcomes Short Term: Attend rehab on a regular basis to increase amount of physical activity.;Long Term: Add in home exercise to make exercise part of routine and to increase amount of physical activity.;Long Term: Exercising regularly at least 3-5 days a week.       Increase Strength and Stamina Yes       Intervention Provide advice, education, support and counseling about physical activity/exercise needs.;Develop an individualized exercise prescription for aerobic and resistive training based on initial evaluation findings, risk stratification, comorbidities and participant's personal goals.       Expected Outcomes Short Term: Perform resistance training exercises routinely during rehab and add in resistance training at home;Short Term: Increase workloads from initial exercise  prescription for resistance, speed, and METs.;Long Term: Improve cardiorespiratory fitness, muscular endurance and strength as measured by increased METs and functional capacity ( )       Able to understand and use rate of perceived exertion (RPE) scale Yes       Intervention  Provide education and explanation on how to use RPE scale       Expected Outcomes Short Term: Able to use RPE daily in rehab to express subjective intensity level;Long Term:  Able to use RPE to guide intensity level when exercising independently       Able to understand and use Dyspnea scale Yes       Intervention Provide education and explanation on how to use Dyspnea scale       Expected Outcomes Short Term: Able to use Dyspnea scale daily in rehab to express subjective sense of shortness of breath during exertion;Long Term: Able to use Dyspnea scale to guide intensity level when exercising independently       Knowledge and understanding of Target Heart Rate Range (THRR) Yes       Intervention Provide education and explanation of THRR including how the numbers were predicted and where they are located for reference       Expected Outcomes Short Term: Able to state/look up THRR;Short Term: Able to use daily as guideline for intensity in rehab;Long Term: Able to use THRR to govern intensity when exercising independently       Able to check pulse independently Yes       Intervention Provide education and demonstration on how to check pulse in carotid and radial arteries.;Review the importance of being able to check your own pulse for safety during independent exercise       Expected Outcomes Short Term: Able to explain why pulse checking is important during independent exercise;Long Term: Able to check pulse independently and accurately       Understanding of Exercise Prescription Yes       Intervention Provide education, explanation, and written materials on patient's individual exercise prescription       Expected Outcomes  Short Term: Able to explain program exercise prescription;Long Term: Able to explain home exercise prescription to exercise independently                Exercise Goals Re-Evaluation :  Exercise Goals Re-Evaluation     Row Name 08/17/20 4098 09/02/20 0832 09/15/20 1335         Exercise Goal Re-Evaluation   Exercise Goals Review Increase Physical Activity;Able to understand and use rate of perceived exertion (RPE) scale;Knowledge and understanding of Target Heart Rate Range (THRR);Understanding of Exercise Prescription;Increase Strength and Stamina;Able to understand and use Dyspnea scale;Able to check pulse independently Increase Physical Activity;Increase Strength and Stamina;Understanding of Exercise Prescription Increase Physical Activity;Increase Strength and Stamina     Comments Reviewed RPE and dyspnea scales, THR and program prescription with pt today.  Pt voiced understanding and was given a copy of goals to take home. Cheryn is off to a good start in rehab.  She has completed her three full days of exercise.   She would benefit more from better attendance.  We will continue to monitor her progress. Kathleena can do 7 laps walking on Track, resting as needed.  Her oxygen stays above 90% most of the time.  Staff instruct her to rest if it gets below 88%     Expected Outcomes Short: Use RPE daily to regulate intensity. Long: Follow program prescription in [facility]. Short: Regular attendance in rehab Long: Continue to follow program prescription Short: work up to 10 laps on track Long: inrease overall stamina              Discharge Exercise Prescription (Final Exercise Prescription Changes):  Exercise Prescription Changes - 09/15/20 1300  Response to Exercise   Blood Pressure (Admit) 120/66    Blood Pressure (Exercise) 118/62    Blood Pressure (Exit) 90/52    Heart Rate (Admit) 89 bpm    Heart Rate (Exercise) 101 bpm    Heart Rate (Exit) 88 bpm    Oxygen Saturation (Admit) 98 %     Oxygen Saturation (Exercise) 90 %    Oxygen Saturation (Exit) 96 %    Rating of Perceived Exertion (Exercise) 12    Perceived Dyspnea (Exercise) 2    Symptoms SOB    Duration Progress to 30 minutes of  aerobic without signs/symptoms of physical distress    Intensity THRR unchanged      Progression   Progression Continue to progress workloads to maintain intensity without signs/symptoms of physical distress.    Average METs 1      Resistance Training   Training Prescription Yes    Weight 3 lb    Reps 10-15      Interval Training   Interval Training No      Oxygen   Oxygen Continuous    Liters 4      Biostep-RELP   Level 1    Minutes 15    METs 1             Nutrition:  Target Goals: Understanding of nutrition guidelines, daily intake of sodium 1500mg , cholesterol 200mg , calories 30% from fat and 7% or less from saturated fats, daily to have 5 or more servings of fruits and vegetables.  Education: All About Nutrition: -Group instruction provided by verbal, written material, interactive activities, discussions, models, and posters to present general guidelines for heart healthy nutrition including fat, fiber, MyPlate, the role of sodium in heart healthy nutrition, utilization of the nutrition label, and utilization of this knowledge for meal planning. Follow up email sent as well. Written material given at graduation. Flowsheet Row Pulmonary Rehab from 09/09/2020 in [facility] Cardiac and Pulmonary Rehab  Date 08/19/20  Educator [facility]  Instruction Review Code 1- Verbalizes Understanding       Biometrics:  Pre Biometrics - 08/10/20 0955       Pre Biometrics   Height 5' 5.4" (1.661 m)    Weight 155 lb 14.4 oz (70.7 kg)    BMI (Calculated) 25.63    Single Leg Stand 0 seconds              Nutrition Therapy Plan and Nutrition Goals:  Nutrition Therapy & Goals - 08/10/20 0955       Intervention Plan   Intervention Prescribe, educate and counsel regarding  individualized specific dietary modifications aiming towards targeted core components such as weight, hypertension, lipid management, diabetes, heart failure and other comorbidities.    Expected Outcomes Short Term Goal: Understand basic principles of dietary content, such as calories, fat, sodium, cholesterol and nutrients.;Short Term Goal: A plan has been developed with personal nutrition goals set during dietitian appointment.;Long Term Goal: Adherence to prescribed nutrition plan.             Nutrition Assessments:  MEDIFICTS Score Key: ?70 Need to make dietary changes  40-70 Heart Healthy Diet ? 40 Therapeutic Level Cholesterol Diet  Flowsheet Row Pulmonary Rehab from 08/10/2020 in [facility] Cardiac and Pulmonary Rehab  Picture Your Plate Total Score on Admission 66      Picture Your Plate Scores: <78 Unhealthy dietary pattern with much room for improvement. 41-50 Dietary pattern unlikely to meet recommendations for good health and room for improvement. 51-60 More  healthful dietary pattern, with some room for improvement.  >60 Healthy dietary pattern, although there may be some specific behaviors that could be improved.   Nutrition Goals Re-Evaluation:  Nutrition Goals Re-Evaluation     Row Name 09/21/20 0915             Goals   Comment Unable to complete intial RD consultation. Rescheduled 3x. Will try to complete consultation sometime while patient is in class.                Nutrition Goals Discharge (Final Nutrition Goals Re-Evaluation):  Nutrition Goals Re-Evaluation - 09/21/20 0915       Goals   Comment Unable to complete intial RD consultation. Rescheduled 3x. Will try to complete consultation sometime while patient is in class.             Psychosocial: Target Goals: Acknowledge presence or absence of significant depression and/or stress, maximize coping skills, provide positive support system. Participant is able to verbalize types and ability to use  techniques and skills needed for reducing stress and depression.   Education: Stress, Anxiety, and Depression - Group verbal and visual presentation to define topics covered.  Reviews how body is impacted by stress, anxiety, and depression.  Also discusses healthy ways to reduce stress and to treat/manage anxiety and depression.  Written material given at graduation.   Education: Sleep Hygiene -Provides group verbal and written instruction about how sleep can affect your health.  Define sleep hygiene, discuss sleep cycles and impact of sleep habits. Review good sleep hygiene tips.    Initial Review & Psychosocial Screening:  Initial Psych Review & Screening - 08/06/20 1404       Initial Review   Current issues with Current Stress Concerns    Source of Stress Concerns Chronic Illness;Unable to participate in former interests or hobbies;Unable to perform yard/household activities      Family Dynamics   Good Support System? Yes   children     Barriers   Psychosocial barriers to participate in program There are no identifiable barriers or psychosocial needs.      Screening Interventions   Interventions Encouraged to exercise    Expected Outcomes Short Term goal: Utilizing psychosocial counselor, staff and physician to assist with identification of specific Stressors or current issues interfering with healing process. Setting desired goal for each stressor or current issue identified.;Long Term Goal: Stressors or current issues are controlled or eliminated.;Short Term goal: Identification and review with participant of any Quality of Life or Depression concerns found by scoring the questionnaire.;Long Term goal: The participant improves quality of Life and PHQ9 Scores as seen by post scores and/or verbalization of changes             Quality of Life Scores:  Scores of 19 and below usually indicate a poorer quality of life in these areas.  A difference of  2-3 points is a clinically  meaningful difference.  A difference of 2-3 points in the total score of the Quality of Life Index has been associated with significant improvement in overall quality of life, self-image, physical symptoms, and general health in studies assessing change in quality of life.  PHQ-9: Recent Review Flowsheet Data     Depression screen [facility] 2/9 09/14/2020 08/10/2020   Decreased Interest 0 0   Down, Depressed, Hopeless 0 0   PHQ - 2 Score 0 0   Altered sleeping 0 1   Tired, decreased energy 0 1   Change in appetite  1 3   Feeling bad or failure about yourself  0 2   Trouble concentrating 0 0   Moving slowly or fidgety/restless 0 1   Suicidal thoughts 0 0   PHQ-9 Score 1 8   Difficult doing work/chores Not difficult at all Not difficult at all      Interpretation of Total Score  Total Score Depression Severity:  1-4 = Minimal depression, 5-9 = Mild depression, 10-14 = Moderate depression, 15-19 = Moderately severe depression, 20-27 = Severe depression   Psychosocial Evaluation and Intervention:  Psychosocial Evaluation - 08/06/20 1426       Psychosocial Evaluation & Interventions   Interventions Encouraged to exercise with the program and follow exercise prescription    Comments Ms. Janiqua's main concern is her breathing and unintentional weight loss due to her medication. She has noticed stairs and long distance walking causes a lot of shortness of breath. She wants to maintain her independence for as long as possible so she is motivated to work on her stamina.  She states that she has been sleeping well and using her supplemental oxygen as needed during physical activity. She did state she wants to feel more comfortable using her oxygen and "lugging" it around. Her daughters are very supportive.    Expected Outcomes Short: attend pulmonary rehab for education and exercsie. Long: develop positive self care habits.    Continue Psychosocial Services  Follow up required by staff              Psychosocial Re-Evaluation:  Psychosocial Re-Evaluation     Row Name 09/21/20 [phone]             Psychosocial Re-Evaluation   Current issues with None Identified       Comments She reports sleeping wells ince her grandchildren are not in there with her. She reports no current stressors. She loves to paint to reliev stress. She relies on her children and neighbors for a support system.       Expected Outcomes ST: continue to paint LT: maintain positive attitude.       Interventions Encouraged to attend Pulmonary Rehabilitation for the exercise       Continue Psychosocial Services  Follow up required by staff                Psychosocial Discharge (Final Psychosocial Re-Evaluation):  Psychosocial Re-Evaluation - 09/21/20 0939       Psychosocial Re-Evaluation   Current issues with None Identified    Comments She reports sleeping wells ince her grandchildren are not in there with her. She reports no current stressors. She loves to paint to reliev stress. She relies on her children and neighbors for a support system.    Expected Outcomes ST: continue to paint LT: maintain positive attitude.    Interventions Encouraged to attend Pulmonary Rehabilitation for the exercise    Continue Psychosocial Services  Follow up required by staff             Education: Education Goals: Education classes will be provided on a weekly basis, covering required topics. Participant will state understanding/return demonstration of topics presented.  Learning Barriers/Preferences:  Learning Barriers/Preferences - 08/06/20 1429       Learning Barriers/Preferences   Learning Barriers None    Learning Preferences None             General Pulmonary Education Topics:  Infection Prevention: - Provides verbal and written material to individual with discussion of infection control including proper hand  washing and proper equipment cleaning during exercise session. Flowsheet Row Pulmonary  Rehab from 09/09/2020 in [facility] Cardiac and Pulmonary Rehab  Date 08/10/20  Educator [facility]  Instruction Review Code 1- Verbalizes Understanding       Falls Prevention: - Provides verbal and written material to individual with discussion of falls prevention and safety. Flowsheet Row Pulmonary Rehab from 09/09/2020 in [facility] Cardiac and Pulmonary Rehab  Date 08/10/20  Educator [facility]  Instruction Review Code 1- Verbalizes Understanding       Chronic Lung Disease Review: - Group verbal instruction with posters, models, PowerPoint presentations and videos,  to review new updates, new respiratory medications, new advancements in procedures and treatments. Providing information on websites and "800" numbers for continued self-education. Includes information about supplement oxygen, available portable oxygen systems, continuous and intermittent flow rates, oxygen safety, concentrators, and Medicare reimbursement for oxygen. Explanation of Pulmonary Drugs, including class, frequency, complications, importance of spacers, rinsing mouth after steroid MDI's, and proper cleaning methods for nebulizers. Review of basic lung anatomy and physiology related to function, structure, and complications of lung disease. Review of risk factors. Discussion about methods for diagnosing sleep apnea and types of masks and machines for OSA. Includes a review of the use of types of environmental controls: home humidity, furnaces, filters, dust mite/pet prevention, HEPA vacuums. Discussion about weather changes, air quality and the benefits of nasal washing. Instruction on Warning signs, infection symptoms, calling MD promptly, preventive modes, and value of vaccinations. Review of effective airway clearance, coughing and/or vibration techniques. Emphasizing that all should Create an Action Plan. Written material given at graduation. Flowsheet Row Pulmonary Rehab from 09/09/2020 in [facility] Cardiac and Pulmonary Rehab  Education need identified  08/10/20  Date 09/09/20  Educator [facility]  Instruction Review Code 1- Verbalizes Understanding       AED/CPR: - Group verbal and written instruction with the use of models to demonstrate the basic use of the AED with the basic ABC's of resuscitation.    Anatomy and Cardiac Procedures: - Group verbal and visual presentation and models provide information about basic cardiac anatomy and function. Reviews the testing methods done to diagnose heart disease and the outcomes of the test results. Describes the treatment choices: Medical Management, Angioplasty, or Coronary Bypass Surgery for treating various heart conditions including Myocardial Infarction, Angina, Valve Disease, and Cardiac Arrhythmias.  Written material given at graduation.   Medication Safety: - Group verbal and visual instruction to review commonly prescribed medications for heart and lung disease. Reviews the medication, class of the drug, and side effects. Includes the steps to properly store meds and maintain the prescription regimen.  Written material given at graduation.   Other: -Provides group and verbal instruction on various topics (see comments)   Knowledge Questionnaire Score:  Knowledge Questionnaire Score - 08/10/20 0955       Knowledge Questionnaire Score   Pre Score 13/18 education focus: O2 safety, exercise              Core Components/Risk Factors/Patient Goals at Admission:  Personal Goals and Risk Factors at Admission - 08/10/20 0956       Core Components/Risk Factors/Patient Goals on Admission    Weight Management Yes;Weight Loss    Intervention Weight Management: Develop a combined nutrition and exercise program designed to reach desired caloric intake, while maintaining appropriate intake of nutrient and fiber, sodium and fats, and appropriate energy expenditure required for the weight goal.;Weight Management: Provide education and appropriate resources to help participant work on and  attain  dietary goals.;Weight Management/Obesity: Establish reasonable short term and long term weight goals.    Admit Weight 155 lb 14.4 oz (70.7 kg)    Goal Weight: Short Term 153 lb (69.4 kg)    Goal Weight: Long Term 150 lb (68 kg)    Expected Outcomes Short Term: Continue to assess and modify interventions until short term weight is achieved;Long Term: Adherence to nutrition and physical activity/exercise program aimed toward attainment of established weight goal;Understanding recommendations for meals to include 15-35% energy as protein, 25-35% energy from fat, 35-60% energy from carbohydrates, less than  of dietary cholesterol, 20-35 gm of total fiber daily;Weight Loss: Understanding of general recommendations for a balanced deficit meal plan, which promotes 1-2 lb weight loss per week and includes a negative energy balance of [phone] kcal/d;Understanding of distribution of calorie intake throughout the day with the consumption of 4-5 meals/snacks    Improve shortness of breath with ADL's Yes    Intervention Provide education, individualized exercise plan and daily activity instruction to help decrease symptoms of SOB with activities of daily living.    Expected Outcomes Short Term: Improve cardiorespiratory fitness to achieve a reduction of symptoms when performing ADLs;Long Term: Be able to perform more ADLs without symptoms or delay the onset of symptoms    Hypertension Yes    Intervention Provide education on lifestyle modifcations including regular physical activity/exercise, weight management, moderate sodium restriction and increased consumption of fresh fruit, vegetables, and low fat dairy, alcohol moderation, and smoking cessation.;Monitor prescription use compliance.    Expected Outcomes Short Term: Continued assessment and intervention until BP is < 140/5mm HG in hypertensive participants. < 130/71mm HG in hypertensive participants with diabetes, heart failure or chronic kidney  disease.;Long Term: Maintenance of blood pressure at goal levels.    Lipids Yes    Intervention Provide education and support for participant on nutrition & aerobic/resistive exercise along with prescribed medications to achieve LDL 70mg , HDL >40mg .    Expected Outcomes Short Term: Participant states understanding of desired cholesterol values and is compliant with medications prescribed. Participant is following exercise prescription and nutrition guidelines.;Long Term: Cholesterol controlled with medications as prescribed, with individualized exercise RX and with personalized nutrition plan. Value goals: LDL < , HDL > 40 mg.             Education:Diabetes - Individual verbal and written instruction to review signs/symptoms of diabetes, desired ranges of glucose level fasting, after meals and with exercise. Acknowledge that pre and post exercise glucose checks will be done for 3 sessions at entry of program. Flowsheet Row Pulmonary Rehab from 09/09/2020 in [facility] Cardiac and Pulmonary Rehab  Date 08/10/20  Educator [facility]  Instruction Review Code 1- Verbalizes Understanding       Know Your Numbers and Heart Failure: - Group verbal and visual instruction to discuss disease risk factors for cardiac and pulmonary disease and treatment options.  Reviews associated critical values for Overweight/Obesity, Hypertension, Cholesterol, and Diabetes.  Discusses basics of heart failure: signs/symptoms and treatments.  Introduces Heart Failure Zone chart for action plan for heart failure.  Written material given at graduation.   Core Components/Risk Factors/Patient Goals Review:   Goals and Risk Factor Review     Row Name 09/21/20 0916             Core Components/Risk Factors/Patient Goals Review   Personal Goals Review Hypertension;Diabetes;Improve shortness of breath with ADL's;Develop more efficient breathing techniques such as purse lipped breathing and diaphragmatic breathing and practicing  self-pacing with activity.       Review Has not spoken to RD yet due to rescheduling. She does not check her BG because her doctor told her she does not have diabetes anymore and she stopped her medicaitons. BP today was 120/70. She checks her BP at home - normally 110-120/60-70. She reports her breathing is good some days, but bad other days. She practices PLB and reports doing better - she feels it helps.       Expected Outcomes ST: continue to practice PLB and attend rehab. LT: monitor risk factors                Core Components/Risk Factors/Patient Goals at Discharge (Final Review):   Goals and Risk Factor Review - 09/21/20 0916       Core Components/Risk Factors/Patient Goals Review   Personal Goals Review Hypertension;Diabetes;Improve shortness of breath with ADL's;Develop more efficient breathing techniques such as purse lipped breathing and diaphragmatic breathing and practicing self-pacing with activity.    Review Has not spoken to RD yet due to rescheduling. She does not check her BG because her doctor told her she does not have diabetes anymore and she stopped her medicaitons. BP today was 120/70. She checks her BP at home - normally 110-120/60-70. She reports her breathing is good some days, but bad other days. She practices PLB and reports doing better - she feels it helps.    Expected Outcomes ST: continue to practice PLB and attend rehab. LT: monitor risk factors             ITP Comments:  ITP Comments     Row Name 08/06/20 1431 08/10/20 0945 08/17/20 0927 08/25/20 0713 09/22/20 0810   ITP Comments Initial telephone orientation completed. Diagnosis can be found in [facility] 3/18. EP orientation scheduled for Tuesday 5/3 at 8am. Completed and gym orientation. Initial ITP created and sent for review to Dr. Bethann Punches, Medical Director. First full day of exercise!  Patient was oriented to gym and equipment including functions, settings, policies, and procedures.  Patient's  individual exercise prescription and treatment plan were reviewed.  All starting workloads were established based on the results of the 6 minute walk test done at initial orientation visit.  The plan for exercise progression was also introduced and progression will be customized based on patient's performance and goals. 30 Day review completed. Medical Director ITP review done, changes made as directed, and signed approval by Medical Director.  New to program 30 Day review completed. Medical Director ITP review done, changes made as directed, and signed approval by Medical Director.    Row Name 09/28/20 1455           ITP Comments Dailyn called today to let us know that she would not be able to attend rehab any longer.  She has had increased demands on her time family wise and it is getting more difficult for her to get to classes.  We will discharge her at this time.                Comments: Discharge ITP

## 2020-09-28 NOTE — Progress Notes (Signed)
Discharge Progress Report  Patient Details  Name: Sierra Guzman MRN: [id number] Date of Birth: 09-May-1940 Referring Provider:   Flowsheet Row Pulmonary Rehab from 08/10/2020 in [facility]  Referring Provider Josph Macho MD        Number of Visits: 8  Reason for Discharge:  Early Exit:  Personal   Smoking History:  Social History   Tobacco Use  Smoking Status Former   Packs/day: 0.25   Years: 10.00   Pack years: 2.50   Types: Cigarettes   Quit date: 08/18/1990   Years since quitting: 30.1  Smokeless Tobacco Never    Diagnosis:  ILD (interstitial lung disease) (HCC)  ADL UCSD:  Pulmonary Assessment Scores     Row Name 08/10/20 0959         ADL UCSD   ADL Phase Entry     SOB Score total 92     Rest 1     Walk 3     Stairs 5     Bath 4     Dress 3     Shop 5           CAT Score     CAT Score 23           mMRC Score     mMRC Score 2             Initial Exercise Prescription:  Initial Exercise Prescription - 08/10/20 0900       Date of Initial Exercise RX and Referring Provider   Date 08/10/20    Referring Provider Josph Macho MD      Oxygen   Oxygen Continuous    Liters 4      Arm Ergometer   Level 1    Watts 9    RPM 25    Minutes 15    METs 1      Biostep-RELP   Level 1    SPM 80    Minutes 15    METs 2      Track   Laps 15    Minutes 15    METs 1.8      Prescription Details   Frequency (times per week) 2    Duration Progress to 30 minutes of continuous aerobic without signs/symptoms of physical distress      Intensity   THRR 40-80% of Max Heartrate 106-129    Ratings of Perceived Exertion 11-13    Perceived Dyspnea 0-4      Progression   Progression Continue to progress workloads to maintain intensity without signs/symptoms of physical distress.      Resistance Training   Training Prescription Yes    Weight 3 lb    Reps 10-15             Discharge Exercise Prescription (Final  Exercise Prescription Changes):  Exercise Prescription Changes - 09/15/20 1300       Response to Exercise   Blood Pressure (Admit) 120/66    Blood Pressure (Exercise) 118/62    Blood Pressure (Exit) 90/52    Heart Rate (Admit) 89 bpm    Heart Rate (Exercise) 101 bpm    Heart Rate (Exit) 88 bpm    Oxygen Saturation (Admit) 98 %    Oxygen Saturation (Exercise) 90 %    Oxygen Saturation (Exit) 96 %    Rating of Perceived Exertion (Exercise) 12    Perceived Dyspnea (Exercise) 2    Symptoms SOB    Duration Progress to  30 minutes of  aerobic without signs/symptoms of physical distress    Intensity THRR unchanged      Progression   Progression Continue to progress workloads to maintain intensity without signs/symptoms of physical distress.    Average METs 1      Resistance Training   Training Prescription Yes    Weight 3 lb    Reps 10-15      Interval Training   Interval Training No      Oxygen   Oxygen Continuous    Liters 4      Biostep-RELP   Level 1    Minutes 15    METs 1             Functional Capacity:  6 Minute Walk     Row Name 08/10/20 0945         6 Minute Walk   Phase Initial     Distance 300 feet     Walk Time 2.45 minutes     # of Rest Breaks 1  then desated and test terminated     MPH 1.39     METS 0.98     RPE 13     Perceived Dyspnea  3     VO2 Peak 3.43     Symptoms Yes (comment)     Comments SOB     Resting HR 84 bpm     Resting BP 146/74     Resting Oxygen Saturation  97 %     Exercise Oxygen Saturation  during 6 min walk 77 %     Max Ex. HR 111 bpm     Max Ex. BP 154/82     2 Minute Post BP 142/82           Interval HR     1 Minute HR 105     2 Minute HR 108     3 Minute HR 111     2 Minute Post HR 104     Interval Heart Rate? Yes           Interval Oxygen     Interval Oxygen? Yes     Baseline Oxygen Saturation % 97 %     1 Minute Oxygen Saturation % 86 %     1 Minute Liters of Oxygen 4 L  pulsed concentrator     2  Minute Oxygen Saturation % 82 %     2 Minute Liters of Oxygen 4 L     3 Minute Oxygen Saturation % 77 %     3 Minute Liters of Oxygen 4 L     2 Minute Post Oxygen Saturation % 88 %     2 Minute Post Liters of Oxygen 4 L             Psychological, QOL, Others - Outcomes: PHQ 2/9: Depression screen [facility] 2/9 09/14/2020 08/10/2020  Decreased Interest 0 0  Down, Depressed, Hopeless 0 0  PHQ - 2 Score 0 0  Altered sleeping 0 1  Tired, decreased energy 0 1  Change in appetite 1 3  Feeling bad or failure about yourself  0 2  Trouble concentrating 0 0  Moving slowly or fidgety/restless 0 1  Suicidal thoughts 0 0  PHQ-9 Score 1 8  Difficult doing work/chores Not difficult at all Not difficult at all       Nutrition & Weight - Outcomes:  Pre Biometrics - 08/10/20 6962       Pre Biometrics  Height 5' 5.4" (1.661 m)    Weight 155 lb 14.4 oz (70.7 kg)    BMI (Calculated) 25.63    Single Leg Stand 0 seconds              Nutrition:  Nutrition Therapy & Goals - 08/10/20 0955       Intervention Plan   Intervention Prescribe, educate and counsel regarding individualized specific dietary modifications aiming towards targeted core components such as weight, hypertension, lipid management, diabetes, heart failure and other comorbidities.    Expected Outcomes Short Term Goal: Understand basic principles of dietary content, such as calories, fat, sodium, cholesterol and nutrients.;Short Term Goal: A plan has been developed with personal nutrition goals set during dietitian appointment.;Long Term Goal: Adherence to prescribed nutrition plan.            Education Questionnaire Score:  Knowledge Questionnaire Score - 08/10/20 0955       Knowledge Questionnaire Score   Pre Score 13/18 education focus: O2 safety, exercise             Goals reviewed with patient; copy given to patient.

## 2020-11-03 ENCOUNTER — Emergency Department: Payer: Medicare HMO

## 2020-11-03 ENCOUNTER — Emergency Department
Admission: EM | Admit: 2020-11-03 | Discharge: 2020-11-04 | Disposition: A | Discharge status: Home / Self Care | Payer: Medicare HMO | Attending: Emergency Medicine | Admitting: Emergency Medicine

## 2020-11-03 DIAGNOSIS — I129 Hypertensive chronic kidney disease with stage 1 through stage 4 chronic kidney disease, or unspecified chronic kidney disease: Secondary | ICD-10-CM | POA: Diagnosis not present

## 2020-11-03 DIAGNOSIS — J849 Interstitial pulmonary disease, unspecified: Secondary | ICD-10-CM

## 2020-11-03 DIAGNOSIS — R0602 Shortness of breath: Secondary | ICD-10-CM | POA: Diagnosis present

## 2020-11-03 DIAGNOSIS — J8410 Pulmonary fibrosis, unspecified: Secondary | ICD-10-CM | POA: Insufficient documentation

## 2020-11-03 DIAGNOSIS — Z96653 Presence of artificial knee joint, bilateral: Secondary | ICD-10-CM | POA: Insufficient documentation

## 2020-11-03 DIAGNOSIS — Z96643 Presence of artificial hip joint, bilateral: Secondary | ICD-10-CM | POA: Diagnosis not present

## 2020-11-03 DIAGNOSIS — Z20822 Contact with and (suspected) exposure to covid-19: Secondary | ICD-10-CM | POA: Insufficient documentation

## 2020-11-03 DIAGNOSIS — E1122 Type 2 diabetes mellitus with diabetic chronic kidney disease: Secondary | ICD-10-CM | POA: Insufficient documentation

## 2020-11-03 DIAGNOSIS — Z87891 Personal history of nicotine dependence: Secondary | ICD-10-CM | POA: Insufficient documentation

## 2020-11-03 DIAGNOSIS — Z79899 Other long term (current) drug therapy: Secondary | ICD-10-CM | POA: Diagnosis not present

## 2020-11-03 DIAGNOSIS — I2510 Atherosclerotic heart disease of native coronary artery without angina pectoris: Secondary | ICD-10-CM | POA: Insufficient documentation

## 2020-11-03 DIAGNOSIS — N1832 Chronic kidney disease, stage 3b: Secondary | ICD-10-CM | POA: Insufficient documentation

## 2020-11-03 DIAGNOSIS — Z7951 Long term (current) use of inhaled steroids: Secondary | ICD-10-CM | POA: Insufficient documentation

## 2020-11-03 DIAGNOSIS — J45909 Unspecified asthma, uncomplicated: Secondary | ICD-10-CM | POA: Diagnosis not present

## 2020-11-03 DIAGNOSIS — Z7982 Long term (current) use of aspirin: Secondary | ICD-10-CM | POA: Insufficient documentation

## 2020-11-03 LAB — COMPREHENSIVE METABOLIC PANEL
ALT: 9 U/L (ref 0–44)
AST: 25 U/L (ref 15–41)
Albumin: 3.2 g/dL — ABNORMAL LOW (ref 3.5–5.0)
Alkaline Phosphatase: 59 U/L (ref 38–126)
Anion gap: 9 (ref 5–15)
BUN: 18 mg/dL (ref 8–23)
CO2: 22 mmol/L (ref 22–32)
Calcium: 9.2 mg/dL (ref 8.9–10.3)
Chloride: 102 mmol/L (ref 98–111)
Creatinine, Ser: 1.24 mg/dL — ABNORMAL HIGH (ref 0.44–1.00)
GFR, Estimated: 44 mL/min — ABNORMAL LOW (ref >60)
Glucose, Bld: 127 mg/dL — ABNORMAL HIGH (ref 70–99)
Potassium: 4.2 mmol/L (ref 3.5–5.1)
Sodium: 133 mmol/L — ABNORMAL LOW (ref 135–145)
Total Bilirubin: 0.5 mg/dL (ref 0.3–1.2)
Total Protein: 7 g/dL (ref 6.5–8.1)

## 2020-11-03 LAB — BRAIN NATRIURETIC PEPTIDE: B Natriuretic Peptide: 93.5 pg/mL (ref 0.0–100.0)

## 2020-11-03 LAB — CBC
HCT: 30.1 % — ABNORMAL LOW (ref 36.0–46.0)
Hemoglobin: 9.2 g/dL — ABNORMAL LOW (ref 12.0–15.0)
MCH: 27.5 pg (ref 26.0–34.0)
MCHC: 30.6 g/dL (ref 30.0–36.0)
MCV: 89.9 fL (ref 80.0–100.0)
Platelets: 300 10*3/uL (ref 150–400)
RBC: 3.35 MIL/uL — ABNORMAL LOW (ref 3.87–5.11)
RDW: 16.3 % — ABNORMAL HIGH (ref 11.5–15.5)
WBC: 12 10*3/uL — ABNORMAL HIGH (ref 4.0–10.5)
nRBC: 0 % (ref 0.0–0.2)

## 2020-11-03 LAB — RESP PANEL BY RT-PCR (FLU A&B, COVID) ARPGX2
Influenza A by PCR: NEGATIVE
Influenza B by PCR: NEGATIVE
SARS Coronavirus 2 by RT PCR: NEGATIVE

## 2020-11-03 LAB — TROPONIN I (HIGH SENSITIVITY)
Troponin I (High Sensitivity): 17 ng/L (ref <18)
Troponin I (High Sensitivity): 16 ng/L (ref <18)

## 2020-11-03 MED ORDER — IPRATROPIUM-ALBUTEROL 0.5-2.5 (3) MG/3ML IN SOLN
3.0000 mL | Freq: Once | RESPIRATORY_TRACT | Status: AC
  Administered 2020-11-03: 3 mL via RESPIRATORY_TRACT
  Filled 2020-11-03: qty 3

## 2020-11-03 MED ORDER — METHYLPREDNISOLONE SODIUM SUCC 125 MG IJ SOLR
125.0000 mg | Freq: Once | INTRAMUSCULAR | Status: AC
  Administered 2020-11-03: 125 mg via INTRAVENOUS
  Filled 2020-11-03: qty 2

## 2020-11-03 MED ORDER — PREDNISONE 10 MG PO TABS: 10.0000 mg | ORAL_TABLET | Freq: Every day | ORAL | 0 refills | Status: DC

## 2020-11-03 NOTE — ED Notes (Signed)
Adult daughter called @[phone] ; MD [phone] gave update and disposition information

## 2020-11-03 NOTE — ED Triage Notes (Signed)
C/o shortness of breath x 2 days; productive cough; denies fever/ chills

## 2020-11-03 NOTE — Discharge Instructions (Addendum)
As we discussed please begin taking your prednisone tomorrow 11/04/2020, please follow-up with your pulmonologist within the next 1 to 2 days for recheck/reevaluation.  Return to the emergency department for any worsening shortness of breath, development of fever, chest pain or any other symptom personally concerning to yourself.

## 2020-11-03 NOTE — ED Provider Notes (Signed)
[facility] Emergency Department Provider Note  Time seen: 7:52 PM  I have reviewed the triage vital signs and the nursing notes.   HISTORY  Chief Complaint Shortness of Breath and Cough   HPI Sierra Guzman is a 80 y.o. female with a past medical history of anemia, CKD, diabetes, hypertension, hyperlipidemia, pulmonary fibrosis, presents to the emergency department for shortness of breath.  According to the patient for the past 2 to 3 days she has had worsening shortness of breath mild cough and congestion.  Spoke to her pulmonologist who called her in Mucinex, was going to check on her today to see if she needed steroids but patient states she did not hear back from them so she came to the emergency department for evaluation.  Patient denies any chest pain.  States she typically wears 2 L of oxygen at rest if needed and 4 L while up walking around.  Currently satting in the 90s on 4 L in the emergency department we will attempt to titrate down.  Patient denies any leg pain or swelling.  Denies any recent fever.   Past Medical History:  Diagnosis Date   Anemia 10/28/2010   unspecified   Anxiety    Aortic valve sclerosis    Arthritis    Asthma    Chronic kidney disease    Chronic Kidney Disease---Stage III   Coronary artery disease 10/28/2010   DDD (degenerative disc disease), lumbar 2006   Diabetes mellitus without complication (HCC)    Diabetes type 2, controlled (HCC) 10/28/2010   Dyspnea    GERD (gastroesophageal reflux disease)    Gout 10/28/2010   Heart abnormality    Heart murmur    History of cardiac catheterization 2006   LAD 50%, D1 50% rest normal.    History of cardiovascular stress test 09/23/2009    lexiscan Normal Cardiolite test, EF 61%. LV size and function - normal   History of echocardiogram 05/27/2008   EF 55%; Mild aortic insufficiency with minimal aortic sclerosis. L vent normal.    Hyperlipidemia    Hyperlipidemia LDL goal <100  10/28/2010   managed by Dr. 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   Primary localized osteoarthritis of right hip 12/12/2016   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   B12 deficiency 10/28/2010   Coronary artery disease 10/28/2010   Diabetes type 2, controlled (HCC) 10/28/2010   Gout 10/28/2010   Hyperlipidemia with target LDL less than 100 10/28/2010   Hypertension 10/28/2010   Osteoarthritis of left knee 10/28/2010    Past Surgical History:  Procedure Laterality Date   BACK SURGERY  2006   DDD   BREAST BIOPSY Right    neg   CARDIAC CATHETERIZATION  2006   EYE SURGERY Bilateral    Cataract Extraction with IOL   JOINT REPLACEMENT Left    Left Total Hip  Replacement   JOINT REPLACEMENT     RIGHT 2005; LEFT 2 X 2004, 2011   REPLACEMENT TOTAL KNEE Left    REVISION TOTAL KNEE ARTHROPLASTY Left    Dr. Ernest Pine   TONSILLECTOMY     TOTAL HIP ARTHROPLASTY Right 12/12/2016   Procedure: TOTAL HIP ARTHROPLASTY ANTERIOR APPROACH;  Surgeon: Kennedy Bucker, MD;  Location: [facility] ORS;  Service: Orthopedics;  Laterality: Right;   TOTAL HIP ARTHROPLASTY Left 12/26/2013   Procedure: ARTHROPLASTY HIP TOTAL; Surgeon: Christell Faith, MD; Location: [facility] OR; Service: Orthopedics; Laterality: left,    TOTAL KNEE ARTHROPLASTY     both knees    Prior to Admission medications   Medication Sig  Start Date End Date Taking? Authorizing Provider  acetaminophen (TYLENOL) 500 MG tablet Take 2 tablets (1,000 mg total) by mouth every 6 (six) hours as needed for mild pain (or Fever >/= 101). 12/14/16   Evon Slack, PA-C  albuterol (VENTOLIN HFA) 108 (90 Base) MCG/ACT inhaler Inhale 1-2 puffs into the lungs every 6 (six) hours as needed for wheezing or shortness of breath.    [provider]  allopurinol (ZYLOPRIM) 300 MG tablet Take 300 mg by mouth daily.     [provider]  aspirin EC 81 MG tablet Take 81 mg by mouth daily.    [provider]  benzonatate (TESSALON) 200 MG capsule Take 1 capsule (200 mg total) by mouth 3 (three) times daily as needed for cough. 03/25/19   Tommie Sams, DO  bisacodyl (DULCOLAX) 10 MG suppository Place 1 suppository (10 mg total) rectally daily as needed for moderate constipation. Patient not taking: Reported on 08/06/2020 12/14/16   Evon Slack, PA-C  Blood Glucose Monitoring Suppl (GLUCOCOM BLOOD GLUCOSE MONITOR) DEVI See admin instructions. 04/23/19   [provider]  candesartan (ATACAND) 32 MG tablet Take by mouth. 10/01/18   [provider]  cetirizine (ZYRTEC) 10 MG tablet Take 10 mg by mouth at bedtime.    [provider]  cholecalciferol (VITAMIN D) 1000 units tablet Take 1,000 Units by mouth daily.    [provider]  Cinnamon 500 MG capsule Take 500 mg by mouth daily.    [provider]  diphenhydrAMINE (BENADRYL) 25 mg capsule Take 25 mg by mouth as needed for itching.    [provider]  docusate sodium (COLACE) 100 MG capsule Take 1 capsule (100 mg total) by mouth 2 (two) times daily. 12/14/16   Evon Slack, PA-C  ESBRIET 267 MG TABS  03/19/19   [provider]  Fluticasone-Salmeterol (ADVAIR) 500-50 MCG/DOSE AEPB Inhale 1 puff into the lungs 2 (two) times daily.     [provider]  gabapentin (NEURONTIN) 300 MG capsule TAKE 2 CAPSULES BY MOUTH  TWICE A DAY AND TAKE 3 CAPS AT BEDTIME 03/02/18   [provider]  hydrochlorothiazide (HYDRODIURIL) 25 MG tablet Take 25 mg by mouth daily. Patient not taking: Reported on 08/06/2020    [provider]  lisinopril (PRINIVIL,ZESTRIL) 40 MG tablet Take 40 mg by mouth daily. Patient not taking: Reported on 08/06/2020    [provider]  loratadine (CLARITIN) 10 MG tablet Take by mouth.    [provider]  magnesium oxide 400 (240 Mg) MG TABS Take by mouth. 07/21/20 07/21/21  [provider]  metFORMIN (GLUCOPHAGE) 1000 MG tablet Take 1,000 mg by mouth every evening. Patient not taking: Reported on 08/06/2020    [provider]  metoprolol succinate (TOPROL-XL) 50 MG  24 hr tablet Take 50 mg by mouth daily.    [provider]  Multiple Vitamin (MULTI-VITAMIN) tablet Take 1 tablet by mouth as needed.    [provider]  Omega-3 Fatty Acids (FISH OIL) 1000 MG CAPS Take 1 capsule by mouth daily.     [provider]  predniSONE (DELTASONE) 50 MG tablet 1 tablet daily x 5 days 03/25/19   Everlene Other G, DO  rosuvastatin (CRESTOR) 20 MG tablet Take 20 mg by mouth daily. 01/17/19   [provider]  vitamin B-12 (CYANOCOBALAMIN) 1000 MCG tablet Take 1,000 mcg by mouth daily.    [provider]  enoxaparin (LOVENOX) 40 MG/0.4ML injection Inject 0.4 mLs (40 mg total) into the skin daily. 12/15/16 03/25/19  Evon Slack, PA-C  pregabalin (LYRICA) 50 MG capsule Take 1 capsule (50 mg total) by mouth 3 (three) times daily. 12/18/16 03/25/19  Lorenso Quarry, NP  ranitidine (ZANTAC) 300 MG capsule Take 300 mg by mouth every evening.  03/25/19  [provider]  simvastatin (ZOCOR) 40 MG tablet Take 40 mg by mouth daily at 6 PM.  03/25/19  [provider]    Allergies  Allergen Reactions   Morphine And Related Nausea And Vomiting    Family History  Problem Relation Age of Onset   Breast cancer Mother  38   Diabetes Mother    Stroke Mother    Stroke Brother    Breast cancer Cousin        pat cousin    Social History Social History   Tobacco Use   Smoking status: Former    Packs/day: 0.25    Years: 10.00    Pack years: 2.50    Types: Cigarettes    Quit date: 08/18/1990    Years since quitting: 30.2   Smokeless tobacco: Never  Vaping Use   Vaping Use: Never used  Substance Use Topics   Alcohol use: No   Drug use: No    Review of Systems Constitutional: Negative for fever. Cardiovascular: Negative for chest pain. Respiratory: Positive for shortness of breath.  Positive for cough. Gastrointestinal: Negative for abdominal pain, vomiting Musculoskeletal: Negative for musculoskeletal complaints Neurological: Negative for headache All other ROS negative  ____________________________________________   PHYSICAL EXAM:  VITAL SIGNS: ED Triage Vitals [11/03/20 1949]  Enc Vitals Group     BP (!) 151/85     Pulse Rate 80     Resp (!) 21     Temp 98.6 F (37 C)     Temp Source Oral     SpO2 99 %     Weight 150 lb (68 kg)     Height 5\' 5"  (1.651 m)     Head Circumference      Peak Flow      Pain Score      Pain Loc      Pain Edu?      Excl. in GC?    Constitutional: Alert and oriented. Well appearing and in no distress. Eyes: Normal exam ENT      Head: Normocephalic and atraumatic.      Mouth/Throat: Mucous membranes are moist. Cardiovascular: Normal rate, regular rhythm.  Respiratory: Normal respiratory effort without tachypnea nor retractions.  Fairly normal respiratory rate.  No obvious wheeze rales or rhonchi. Gastrointestinal: Soft and nontender. No distention.   Musculoskeletal: Nontender with normal range of motion in all extremities. No lower extremity tenderness or edema. Neurologic:  Normal speech and language. No gross focal neurologic  deficits are appreciated. Skin:  Skin is warm, dry and intact.  Psychiatric: Mood and affect are normal. Speech and  behavior are normal.   ____________________________________________    RADIOLOGY  IMPRESSION:  1. Interstitial lung disease with honeycombing and cylindrical  bronchiectasis. 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  2. No superimposed acute airspace disease or pneumonia.  3. Mediastinal adenopathy is likely reactive, however nonspecific.  4. Moderate hiatal hernia. No evidence of esophageal wall thickening  or inflammation.  5. Coronary artery calcifications.  6. Mild cardiomegaly. Borderline dilatation of the main and right  main pulmonary arteries, can be seen with pulmonary arterial  hypertension.   IMPRESSION:  1. Substantially worsened bilateral interstitial accentuation in the  lungs potentially from advancing chronic interstitial lung disease  or acute superimposed process such as atypical pneumonia or drug  reaction.  2. Potential airspace opacity in the left lower lobe in the  retrocardiac region, superimposed pneumonia is not excluded.   ____________________________________________   INITIAL IMPRESSION / ASSESSMENT AND PLAN / ED COURSE  Pertinent labs & imaging results that were available during my care of the patient were reviewed by me and considered in my medical decision making (see chart for details).   Patient presents emergency department for worsening shortness of breath over the past 2 to 3 days.  Currently patient appears overall well, satting in the upper 90s on 4 L.  We will attempt to titrate to 2 L.  We will check labs, COVID swab, chest x-ray and continue to closely monitor.  Differential would include pulmonary fibrosis exacerbation, pneumonia, CHF, ACS.  Patient CT scan shows what appears to be worsening chronic lung disease without any significant superimposed infectious process.  We will place the patient on prednisone, we will discharge with pulmonology  follow-up.  Patient agreeable to plan of care.  There states the daughter called and was concerned about the patient being discharged home and requested a phone call back.  I attempted to call the daughter back at the number provided [phone], but there was no answer.  I discussed the findings with the patient.  Patient states she is feeling much better.  Patient received Solu-Medrol in the emergency department.  We will discharge with a prednisone taper and have the patient follow-up with her pulmonologist.  Patient has oxygen to use at home I recommended that she use 4 L until she can see her pulmonologist.  Patient agreeable to plan of care.  Discussed very strict return precautions for any worsening shortness of breath development of chest pain or fever.  TEMARI SCHOOLER was evaluated in Emergency Department on 11/03/2020 for the symptoms described in the history of present illness. She was evaluated in the context of the global COVID-19 pandemic, which necessitated consideration that the patient might be at risk for infection with the SARS-CoV-2 virus that causes COVID-19. Institutional protocols and algorithms that pertain to the evaluation of patients at risk for COVID-19 are in a state of rapid change based on information released by regulatory bodies including the CDC and federal and state organizations. These policies and algorithms were followed during the patient's care in the ED.  ____________________________________________   FINAL CLINICAL IMPRESSION(S) / ED DIAGNOSES  Dyspnea Pulmonary fibrosis   Minna Antis, MD 11/03/20 2316

## 2020-12-04 ENCOUNTER — Other Ambulatory Visit: Payer: Self-pay

## 2020-12-04 ENCOUNTER — Encounter: Payer: Self-pay | Admitting: *Deleted

## 2020-12-04 ENCOUNTER — Emergency Department: Payer: Medicare HMO

## 2020-12-04 ENCOUNTER — Inpatient Hospital Stay
Admission: EM | Admit: 2020-12-04 | Discharge: 2020-12-19 | DRG: 196 | Disposition: A | Discharge status: Skilled Nursing Facility | Payer: Medicare HMO | Attending: Internal Medicine | Admitting: Internal Medicine

## 2020-12-04 DIAGNOSIS — Z833 Family history of diabetes mellitus: Secondary | ICD-10-CM

## 2020-12-04 DIAGNOSIS — E1165 Type 2 diabetes mellitus with hyperglycemia: Secondary | ICD-10-CM | POA: Diagnosis present

## 2020-12-04 DIAGNOSIS — J4540 Moderate persistent asthma, uncomplicated: Secondary | ICD-10-CM | POA: Diagnosis present

## 2020-12-04 DIAGNOSIS — E538 Deficiency of other specified B group vitamins: Secondary | ICD-10-CM | POA: Diagnosis present

## 2020-12-04 DIAGNOSIS — R0600 Dyspnea, unspecified: Secondary | ICD-10-CM

## 2020-12-04 DIAGNOSIS — Z803 Family history of malignant neoplasm of breast: Secondary | ICD-10-CM

## 2020-12-04 DIAGNOSIS — J84112 Idiopathic pulmonary fibrosis: Principal | ICD-10-CM | POA: Diagnosis present

## 2020-12-04 DIAGNOSIS — Z96653 Presence of artificial knee joint, bilateral: Secondary | ICD-10-CM | POA: Diagnosis present

## 2020-12-04 DIAGNOSIS — E559 Vitamin D deficiency, unspecified: Secondary | ICD-10-CM | POA: Diagnosis present

## 2020-12-04 DIAGNOSIS — Z20822 Contact with and (suspected) exposure to covid-19: Secondary | ICD-10-CM | POA: Diagnosis present

## 2020-12-04 DIAGNOSIS — Z96643 Presence of artificial hip joint, bilateral: Secondary | ICD-10-CM | POA: Diagnosis present

## 2020-12-04 DIAGNOSIS — Z7982 Long term (current) use of aspirin: Secondary | ICD-10-CM

## 2020-12-04 DIAGNOSIS — J9601 Acute respiratory failure with hypoxia: Secondary | ICD-10-CM | POA: Diagnosis present

## 2020-12-04 DIAGNOSIS — R0609 Other forms of dyspnea: Secondary | ICD-10-CM

## 2020-12-04 DIAGNOSIS — Z79899 Other long term (current) drug therapy: Secondary | ICD-10-CM

## 2020-12-04 DIAGNOSIS — M109 Gout, unspecified: Secondary | ICD-10-CM | POA: Diagnosis present

## 2020-12-04 DIAGNOSIS — Z7984 Long term (current) use of oral hypoglycemic drugs: Secondary | ICD-10-CM

## 2020-12-04 DIAGNOSIS — Z823 Family history of stroke: Secondary | ICD-10-CM

## 2020-12-04 DIAGNOSIS — E1122 Type 2 diabetes mellitus with diabetic chronic kidney disease: Secondary | ICD-10-CM | POA: Diagnosis present

## 2020-12-04 DIAGNOSIS — J479 Bronchiectasis, uncomplicated: Secondary | ICD-10-CM | POA: Diagnosis present

## 2020-12-04 DIAGNOSIS — J9611 Chronic respiratory failure with hypoxia: Secondary | ICD-10-CM | POA: Diagnosis present

## 2020-12-04 DIAGNOSIS — I2510 Atherosclerotic heart disease of native coronary artery without angina pectoris: Secondary | ICD-10-CM | POA: Diagnosis present

## 2020-12-04 DIAGNOSIS — E785 Hyperlipidemia, unspecified: Secondary | ICD-10-CM | POA: Diagnosis present

## 2020-12-04 DIAGNOSIS — T380X5A Adverse effect of glucocorticoids and synthetic analogues, initial encounter: Secondary | ICD-10-CM | POA: Diagnosis present

## 2020-12-04 DIAGNOSIS — Z7951 Long term (current) use of inhaled steroids: Secondary | ICD-10-CM

## 2020-12-04 DIAGNOSIS — I5032 Chronic diastolic (congestive) heart failure: Secondary | ICD-10-CM | POA: Diagnosis present

## 2020-12-04 DIAGNOSIS — J9621 Acute and chronic respiratory failure with hypoxia: Secondary | ICD-10-CM | POA: Diagnosis present

## 2020-12-04 DIAGNOSIS — Z9981 Dependence on supplemental oxygen: Secondary | ICD-10-CM

## 2020-12-04 DIAGNOSIS — K219 Gastro-esophageal reflux disease without esophagitis: Secondary | ICD-10-CM | POA: Diagnosis present

## 2020-12-04 DIAGNOSIS — I10 Essential (primary) hypertension: Secondary | ICD-10-CM | POA: Diagnosis present

## 2020-12-04 DIAGNOSIS — N1832 Chronic kidney disease, stage 3b: Secondary | ICD-10-CM | POA: Diagnosis present

## 2020-12-04 DIAGNOSIS — Z87891 Personal history of nicotine dependence: Secondary | ICD-10-CM

## 2020-12-04 DIAGNOSIS — D631 Anemia in chronic kidney disease: Secondary | ICD-10-CM | POA: Diagnosis present

## 2020-12-04 DIAGNOSIS — I130 Hypertensive heart and chronic kidney disease with heart failure and stage 1 through stage 4 chronic kidney disease, or unspecified chronic kidney disease: Secondary | ICD-10-CM | POA: Diagnosis present

## 2020-12-04 DIAGNOSIS — R609 Edema, unspecified: Secondary | ICD-10-CM

## 2020-12-04 DIAGNOSIS — D509 Iron deficiency anemia, unspecified: Secondary | ICD-10-CM | POA: Diagnosis present

## 2020-12-04 DIAGNOSIS — E8809 Other disorders of plasma-protein metabolism, not elsewhere classified: Secondary | ICD-10-CM | POA: Diagnosis present

## 2020-12-04 LAB — BASIC METABOLIC PANEL
Anion gap: 9 (ref 5–15)
BUN: 22 mg/dL (ref 8–23)
CO2: 24 mmol/L (ref 22–32)
Calcium: 9.1 mg/dL (ref 8.9–10.3)
Chloride: 102 mmol/L (ref 98–111)
Creatinine, Ser: 1.33 mg/dL — ABNORMAL HIGH (ref 0.44–1.00)
GFR, Estimated: 40 mL/min — ABNORMAL LOW (ref >60)
Glucose, Bld: 384 mg/dL — ABNORMAL HIGH (ref 70–99)
Potassium: 4.5 mmol/L (ref 3.5–5.1)
Sodium: 135 mmol/L (ref 135–145)

## 2020-12-04 LAB — CBC
HCT: 27 % — ABNORMAL LOW (ref 36.0–46.0)
Hemoglobin: 8.4 g/dL — ABNORMAL LOW (ref 12.0–15.0)
MCH: 27.2 pg (ref 26.0–34.0)
MCHC: 31.1 g/dL (ref 30.0–36.0)
MCV: 87.4 fL (ref 80.0–100.0)
Platelets: 329 10*3/uL (ref 150–400)
RBC: 3.09 MIL/uL — ABNORMAL LOW (ref 3.87–5.11)
RDW: 16.8 % — ABNORMAL HIGH (ref 11.5–15.5)
WBC: 10.3 10*3/uL (ref 4.0–10.5)
nRBC: 0 % (ref 0.0–0.2)

## 2020-12-04 MED ORDER — SODIUM CHLORIDE 0.9 % IV BOLUS (SEPSIS)
500.0000 mL | Freq: Once | INTRAVENOUS | Status: AC
  Administered 2020-12-05: 500 mL via INTRAVENOUS

## 2020-12-04 NOTE — ED Notes (Signed)
Pt. Transferred herself to bed from wheelchair, O2 sats 84%. Once sitting in bed, relaxed, pt's O2 sat recovered to 100%. MD notified.

## 2020-12-04 NOTE — ED Notes (Signed)
Oxygen tank replaced °

## 2020-12-04 NOTE — ED Triage Notes (Signed)
Pt daughter says that for several days, has not been feeling well, has had increasing weakness, mild confusion, and SOB. Pt says she wears b/w 2-6 liters Gideon chronic. Also having urinary incontinence and urgency. Pt daughter does having COVID, pt did home COVID test today and it was negative.

## 2020-12-05 ENCOUNTER — Emergency Department: Payer: Medicare HMO

## 2020-12-05 ENCOUNTER — Encounter: Payer: Self-pay | Admitting: Internal Medicine

## 2020-12-05 DIAGNOSIS — I10 Essential (primary) hypertension: Secondary | ICD-10-CM | POA: Diagnosis not present

## 2020-12-05 DIAGNOSIS — J9611 Chronic respiratory failure with hypoxia: Secondary | ICD-10-CM | POA: Diagnosis present

## 2020-12-05 DIAGNOSIS — Z96643 Presence of artificial hip joint, bilateral: Secondary | ICD-10-CM | POA: Diagnosis present

## 2020-12-05 DIAGNOSIS — M109 Gout, unspecified: Secondary | ICD-10-CM | POA: Diagnosis present

## 2020-12-05 DIAGNOSIS — J479 Bronchiectasis, uncomplicated: Secondary | ICD-10-CM | POA: Diagnosis present

## 2020-12-05 DIAGNOSIS — E1122 Type 2 diabetes mellitus with diabetic chronic kidney disease: Secondary | ICD-10-CM | POA: Diagnosis present

## 2020-12-05 DIAGNOSIS — Z96653 Presence of artificial knee joint, bilateral: Secondary | ICD-10-CM | POA: Diagnosis present

## 2020-12-05 DIAGNOSIS — Z20822 Contact with and (suspected) exposure to covid-19: Secondary | ICD-10-CM | POA: Diagnosis present

## 2020-12-05 DIAGNOSIS — J9601 Acute respiratory failure with hypoxia: Secondary | ICD-10-CM | POA: Diagnosis not present

## 2020-12-05 DIAGNOSIS — J849 Interstitial pulmonary disease, unspecified: Secondary | ICD-10-CM | POA: Diagnosis not present

## 2020-12-05 DIAGNOSIS — J4540 Moderate persistent asthma, uncomplicated: Secondary | ICD-10-CM | POA: Diagnosis present

## 2020-12-05 DIAGNOSIS — J9621 Acute and chronic respiratory failure with hypoxia: Secondary | ICD-10-CM | POA: Diagnosis present

## 2020-12-05 DIAGNOSIS — D509 Iron deficiency anemia, unspecified: Secondary | ICD-10-CM | POA: Diagnosis present

## 2020-12-05 DIAGNOSIS — J84112 Idiopathic pulmonary fibrosis: Secondary | ICD-10-CM | POA: Diagnosis present

## 2020-12-05 DIAGNOSIS — Z9981 Dependence on supplemental oxygen: Secondary | ICD-10-CM | POA: Diagnosis not present

## 2020-12-05 DIAGNOSIS — E559 Vitamin D deficiency, unspecified: Secondary | ICD-10-CM | POA: Diagnosis present

## 2020-12-05 DIAGNOSIS — I2510 Atherosclerotic heart disease of native coronary artery without angina pectoris: Secondary | ICD-10-CM | POA: Diagnosis present

## 2020-12-05 DIAGNOSIS — N1832 Chronic kidney disease, stage 3b: Secondary | ICD-10-CM | POA: Diagnosis present

## 2020-12-05 DIAGNOSIS — K219 Gastro-esophageal reflux disease without esophagitis: Secondary | ICD-10-CM | POA: Diagnosis present

## 2020-12-05 DIAGNOSIS — R0600 Dyspnea, unspecified: Secondary | ICD-10-CM | POA: Diagnosis present

## 2020-12-05 DIAGNOSIS — E1165 Type 2 diabetes mellitus with hyperglycemia: Secondary | ICD-10-CM | POA: Diagnosis present

## 2020-12-05 DIAGNOSIS — I5032 Chronic diastolic (congestive) heart failure: Secondary | ICD-10-CM | POA: Diagnosis present

## 2020-12-05 DIAGNOSIS — T380X5A Adverse effect of glucocorticoids and synthetic analogues, initial encounter: Secondary | ICD-10-CM | POA: Diagnosis present

## 2020-12-05 DIAGNOSIS — N189 Chronic kidney disease, unspecified: Secondary | ICD-10-CM

## 2020-12-05 DIAGNOSIS — E538 Deficiency of other specified B group vitamins: Secondary | ICD-10-CM | POA: Diagnosis present

## 2020-12-05 DIAGNOSIS — E8809 Other disorders of plasma-protein metabolism, not elsewhere classified: Secondary | ICD-10-CM | POA: Diagnosis present

## 2020-12-05 DIAGNOSIS — N179 Acute kidney failure, unspecified: Secondary | ICD-10-CM

## 2020-12-05 DIAGNOSIS — I130 Hypertensive heart and chronic kidney disease with heart failure and stage 1 through stage 4 chronic kidney disease, or unspecified chronic kidney disease: Secondary | ICD-10-CM | POA: Diagnosis present

## 2020-12-05 DIAGNOSIS — D631 Anemia in chronic kidney disease: Secondary | ICD-10-CM | POA: Diagnosis present

## 2020-12-05 DIAGNOSIS — Z87891 Personal history of nicotine dependence: Secondary | ICD-10-CM | POA: Diagnosis not present

## 2020-12-05 DIAGNOSIS — E785 Hyperlipidemia, unspecified: Secondary | ICD-10-CM | POA: Diagnosis present

## 2020-12-05 DIAGNOSIS — R0609 Other forms of dyspnea: Secondary | ICD-10-CM | POA: Diagnosis not present

## 2020-12-05 LAB — URINALYSIS, COMPLETE (UACMP) WITH MICROSCOPIC
Bacteria, UA: NONE SEEN
Bilirubin Urine: NEGATIVE
Glucose, UA: >=500 mg/dL — AB
Hgb urine dipstick: NEGATIVE
Ketones, ur: NEGATIVE mg/dL
Leukocytes,Ua: NEGATIVE
Nitrite: NEGATIVE
Protein, ur: NEGATIVE mg/dL
Specific Gravity, Urine: 1.021 (ref 1.005–1.030)
pH: 5 (ref 5.0–8.0)

## 2020-12-05 LAB — BASIC METABOLIC PANEL
Anion gap: 6 (ref 5–15)
BUN: 19 mg/dL (ref 8–23)
CO2: 25 mmol/L (ref 22–32)
Calcium: 8.9 mg/dL (ref 8.9–10.3)
Chloride: 107 mmol/L (ref 98–111)
Creatinine, Ser: 1.19 mg/dL — ABNORMAL HIGH (ref 0.44–1.00)
GFR, Estimated: 46 mL/min — ABNORMAL LOW (ref >60)
Glucose, Bld: 221 mg/dL — ABNORMAL HIGH (ref 70–99)
Potassium: 4.3 mmol/L (ref 3.5–5.1)
Sodium: 138 mmol/L (ref 135–145)

## 2020-12-05 LAB — CBC
HCT: 24.2 % — ABNORMAL LOW (ref 36.0–46.0)
Hemoglobin: 7.4 g/dL — ABNORMAL LOW (ref 12.0–15.0)
MCH: 26.4 pg (ref 26.0–34.0)
MCHC: 30.6 g/dL (ref 30.0–36.0)
MCV: 86.4 fL (ref 80.0–100.0)
Platelets: 280 10*3/uL (ref 150–400)
RBC: 2.8 MIL/uL — ABNORMAL LOW (ref 3.87–5.11)
RDW: 16.8 % — ABNORMAL HIGH (ref 11.5–15.5)
WBC: 8.9 10*3/uL (ref 4.0–10.5)
nRBC: 0 % (ref 0.0–0.2)

## 2020-12-05 LAB — RESP PANEL BY RT-PCR (FLU A&B, COVID) ARPGX2
Influenza A by PCR: NEGATIVE
Influenza B by PCR: NEGATIVE
SARS Coronavirus 2 by RT PCR: NEGATIVE

## 2020-12-05 LAB — HEPATIC FUNCTION PANEL
ALT: 13 U/L (ref 0–44)
AST: 24 U/L (ref 15–41)
Albumin: 2.9 g/dL — ABNORMAL LOW (ref 3.5–5.0)
Alkaline Phosphatase: 55 U/L (ref 38–126)
Bilirubin, Direct: <0.1 mg/dL (ref 0.0–0.2)
Total Bilirubin: 0.3 mg/dL (ref 0.3–1.2)
Total Protein: 6.8 g/dL (ref 6.5–8.1)

## 2020-12-05 LAB — HEMOGLOBIN A1C
Hgb A1c MFr Bld: 8.1 % — ABNORMAL HIGH (ref 4.8–5.6)
Mean Plasma Glucose: 185.77 mg/dL

## 2020-12-05 LAB — BRAIN NATRIURETIC PEPTIDE: B Natriuretic Peptide: 87.1 pg/mL (ref 0.0–100.0)

## 2020-12-05 LAB — TROPONIN I (HIGH SENSITIVITY)
Troponin I (High Sensitivity): 17 ng/L (ref <18)
Troponin I (High Sensitivity): 18 ng/L — ABNORMAL HIGH (ref <18)

## 2020-12-05 LAB — LIPASE, BLOOD: Lipase: 28 U/L (ref 11–51)

## 2020-12-05 LAB — LACTIC ACID, PLASMA: Lactic Acid, Venous: 1.5 mmol/L (ref 0.5–1.9)

## 2020-12-05 LAB — PROTIME-INR
INR: 1.1 (ref 0.8–1.2)
Prothrombin Time: 14.5 seconds (ref 11.4–15.2)

## 2020-12-05 LAB — GLUCOSE, CAPILLARY: Glucose-Capillary: 222 mg/dL — ABNORMAL HIGH (ref 70–99)

## 2020-12-05 LAB — CBG MONITORING, ED
Glucose-Capillary: 241 mg/dL — ABNORMAL HIGH (ref 70–99)
Glucose-Capillary: 284 mg/dL — ABNORMAL HIGH (ref 70–99)
Glucose-Capillary: 134 mg/dL — ABNORMAL HIGH (ref 70–99)

## 2020-12-05 MED ORDER — IOHEXOL 350 MG/ML SOLN
75.0000 mL | Freq: Once | INTRAVENOUS | Status: AC | PRN
  Administered 2020-12-05: 75 mL via INTRAVENOUS

## 2020-12-05 MED ORDER — INSULIN ASPART 100 UNIT/ML IJ SOLN
0.0000 [IU] | Freq: Three times a day (TID) | INTRAMUSCULAR | Status: DC
  Administered 2020-12-05: 3 [IU] via SUBCUTANEOUS
  Administered 2020-12-05 (×2): 7 [IU] via SUBCUTANEOUS
  Administered 2020-12-05: 11 [IU] via SUBCUTANEOUS
  Administered 2020-12-06: 3 [IU] via SUBCUTANEOUS
  Administered 2020-12-06: 4 [IU] via SUBCUTANEOUS
  Administered 2020-12-06: 7 [IU] via SUBCUTANEOUS
  Administered 2020-12-06: 11 [IU] via SUBCUTANEOUS
  Administered 2020-12-07: 15 [IU] via SUBCUTANEOUS
  Administered 2020-12-08: 3 [IU] via SUBCUTANEOUS
  Administered 2020-12-08: 15 [IU] via SUBCUTANEOUS
  Administered 2020-12-08 (×2): 11 [IU] via SUBCUTANEOUS
  Administered 2020-12-09: 7 [IU] via SUBCUTANEOUS
  Administered 2020-12-09: 3 [IU] via SUBCUTANEOUS
  Administered 2020-12-09: 7 [IU] via SUBCUTANEOUS
  Administered 2020-12-09 – 2020-12-10 (×2): 4 [IU] via SUBCUTANEOUS
  Administered 2020-12-11 (×2): 3 [IU] via SUBCUTANEOUS
  Administered 2020-12-11: 4 [IU] via SUBCUTANEOUS
  Administered 2020-12-12: 3 [IU] via SUBCUTANEOUS
  Administered 2020-12-12: 7 [IU] via SUBCUTANEOUS
  Administered 2020-12-12 – 2020-12-13 (×3): 3 [IU] via SUBCUTANEOUS
  Administered 2020-12-13: 4 [IU] via SUBCUTANEOUS
  Administered 2020-12-14 – 2020-12-15 (×4): 3 [IU] via SUBCUTANEOUS
  Administered 2020-12-15: 4 [IU] via SUBCUTANEOUS
  Administered 2020-12-16: 7 [IU] via SUBCUTANEOUS
  Administered 2020-12-17: 3 [IU] via SUBCUTANEOUS
  Administered 2020-12-17: 4 [IU] via SUBCUTANEOUS
  Administered 2020-12-17: 3 [IU] via SUBCUTANEOUS
  Administered 2020-12-18: 7 [IU] via SUBCUTANEOUS
  Administered 2020-12-18: 3 [IU] via SUBCUTANEOUS
  Administered 2020-12-19: 7 [IU] via SUBCUTANEOUS
  Filled 2020-12-05 (×40): qty 1

## 2020-12-05 MED ORDER — PREDNISONE 20 MG PO TABS
40.0000 mg | ORAL_TABLET | Freq: Every day | ORAL | Status: AC
  Administered 2020-12-06 – 2020-12-09 (×4): 40 mg via ORAL
  Filled 2020-12-05 (×4): qty 2

## 2020-12-05 MED ORDER — ONDANSETRON HCL 4 MG PO TABS: 4.0000 mg | ORAL_TABLET | Freq: Four times a day (QID) | ORAL | Status: DC | PRN

## 2020-12-05 MED ORDER — ENOXAPARIN SODIUM 40 MG/0.4ML IJ SOSY
40.0000 mg | PREFILLED_SYRINGE | INTRAMUSCULAR | Status: DC
  Administered 2020-12-05 – 2020-12-19 (×15): 40 mg via SUBCUTANEOUS
  Filled 2020-12-05 (×15): qty 0.4

## 2020-12-05 MED ORDER — ACETAMINOPHEN 650 MG RE SUPP: 650.0000 mg | Freq: Four times a day (QID) | RECTAL | Status: DC | PRN

## 2020-12-05 MED ORDER — GUAIFENESIN-DM 100-10 MG/5ML PO SYRP
5.0000 mL | ORAL_SOLUTION | ORAL | Status: DC | PRN
  Administered 2020-12-05 – 2020-12-12 (×3): 5 mL via ORAL
  Filled 2020-12-05 (×3): qty 5

## 2020-12-05 MED ORDER — ACETAMINOPHEN 325 MG PO TABS
650.0000 mg | ORAL_TABLET | Freq: Four times a day (QID) | ORAL | Status: DC | PRN
  Administered 2020-12-12 – 2020-12-13 (×2): 650 mg via ORAL
  Filled 2020-12-05 (×2): qty 2

## 2020-12-05 MED ORDER — ONDANSETRON HCL 4 MG/2ML IJ SOLN: 4.0000 mg | Freq: Four times a day (QID) | INTRAMUSCULAR | Status: DC | PRN

## 2020-12-05 MED ORDER — MAGNESIUM HYDROXIDE 400 MG/5ML PO SUSP
30.0000 mL | Freq: Every day | ORAL | Status: DC | PRN
  Administered 2020-12-07 – 2020-12-08 (×2): 30 mL via ORAL
  Filled 2020-12-05 (×2): qty 30

## 2020-12-05 MED ORDER — TRAZODONE HCL 50 MG PO TABS
25.0000 mg | ORAL_TABLET | Freq: Every evening | ORAL | Status: DC | PRN
  Filled 2020-12-05: qty 1

## 2020-12-05 MED ORDER — METHYLPREDNISOLONE SODIUM SUCC 40 MG IJ SOLR
40.0000 mg | Freq: Three times a day (TID) | INTRAMUSCULAR | Status: AC
  Administered 2020-12-05 (×3): 40 mg via INTRAVENOUS
  Filled 2020-12-05 (×3): qty 1

## 2020-12-05 NOTE — ED Provider Notes (Signed)
[facility] Emergency Department Provider Note  ____________________________________________   Event Date/Time   First MD Initiated Contact with Patient 12/04/20 2258     (approximate)  I have reviewed the triage vital signs and the nursing notes.   HISTORY  Chief Complaint Weakness    HPI Sierra Guzman is a 80 y.o. female with extensive medical history as listed below which most notably includes either pulmonary fibrosis or some degree of chronic interstitial pneumonia, the family is not certain exactly which because they have been told both at various times.  She is on anywhere from 2 to 5 L of oxygen at baseline.  She presents because she has been increasingly short of breath, particular with exertion, over the last few weeks but it is gotten much worse over the last couple of days.  She has been using her nebulizer treatments as recommended.  She has to turn up her oxygen to 5 L if she tries to get up at all but now even on 5 L she cannot walk to the door or the commode without becoming very short of breath and becoming lightheaded.  Her symptoms have become severe, are much worse with exertion, slightly better at rest.  She denies fever, sore throat, chest pain, nausea, vomiting, and abdominal pain.  She and her daughter report that she has had no recent change of appetite or oral intake.  She occasionally has some pain after she urinates or a feeling of incomplete voiding, but no burning dysuria.  She has not seen her [facility] pulmonologist recently to discuss her symptoms.     Past Medical History:  Diagnosis Date   Anemia 10/28/2010   unspecified   Anxiety    Aortic valve sclerosis    Arthritis    Asthma    Chronic kidney disease    Chronic Kidney Disease---Stage III   Coronary artery disease 10/28/2010   DDD (degenerative disc disease), lumbar 2006   Diabetes mellitus without complication (HCC)    Diabetes type 2, controlled (HCC) 10/28/2010    Dyspnea    GERD (gastroesophageal reflux disease)    Gout 10/28/2010   Heart abnormality    Heart murmur    History of cardiac catheterization 2006   LAD 50%, D1 50% rest normal.    History of cardiovascular stress test 09/23/2009    lexiscan Normal Cardiolite test, EF 61%. LV size and function - normal   History of echocardiogram 05/27/2008   EF 55%; Mild aortic insufficiency with minimal aortic sclerosis. L vent normal.    Hyperlipidemia    Hyperlipidemia LDL goal <100 10/28/2010   managed by Dr. Burr Medico   Hypertension 10/28/2010   Interstitial pneumonia (HCC)    unspecified   Mild vitamin D deficiency 02/22/2011   Obesity (BMI 30-39.9) 10/28/2010   unspecified   Osteoarthritis 10/28/2010   bilateral knees, left hip   Personal history of colonic polyps 10/28/2010   Shortness of breath    Syncope 2013   Vitamin B 12 deficiency 10/28/2010    Patient Active Problem List   Diagnosis Date Noted   Acute on chronic respiratory failure with hypoxemia (HCC) 12/05/2020   Hip pain 08/06/2020   Spinal stenosis in cervical region 08/06/2020   Spondylolisthesis, congenital 08/06/2020   Status post total hip replacement, 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   Primary localized osteoarthritis of right hip 12/12/2016   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   B12 deficiency 10/28/2010  Coronary artery disease 10/28/2010   Diabetes type 2, controlled (HCC) 10/28/2010   Gout 10/28/2010   Hyperlipidemia with target LDL less than 100 10/28/2010   Hypertension 10/28/2010   Osteoarthritis of left knee 10/28/2010    Past Surgical History:  Procedure Laterality Date   BACK SURGERY  2006   DDD   BREAST BIOPSY Right    neg   CARDIAC CATHETERIZATION  2006   EYE SURGERY Bilateral    Cataract Extraction with IOL   JOINT REPLACEMENT Left    Left Total Hip Replacement   JOINT REPLACEMENT     RIGHT 2005; LEFT 2 X 2004, 2011   REPLACEMENT  TOTAL KNEE Left    REVISION TOTAL KNEE ARTHROPLASTY Left    Dr. Ernest Pine   TONSILLECTOMY     TOTAL HIP ARTHROPLASTY Right 12/12/2016   Procedure: TOTAL HIP ARTHROPLASTY ANTERIOR APPROACH;  Surgeon: Kennedy Bucker, MD;  Location: [facility] ORS;  Service: Orthopedics;  Laterality: Right;   TOTAL HIP ARTHROPLASTY Left 12/26/2013   Procedure: ARTHROPLASTY HIP TOTAL; Surgeon: Christell Faith, MD; Location: [facility]; Service: Orthopedics; Laterality: left,    TOTAL KNEE ARTHROPLASTY     both knees    Prior to Admission medications   Medication Sig Start Date End Date Taking? Authorizing Provider  acetaminophen (TYLENOL) 500 MG tablet Take 2 tablets (1,000 mg total) by mouth every 6 (six) hours as needed for mild pain (or Fever >/= 101). 12/14/16  Yes Evon Slack, PA-C  albuterol (ACCUNEB) 0.63 MG/3ML nebulizer solution SMARTSIG:3 Milliliter(s) Via Nebulizer Every 6 Hours PRN 11/26/20  Yes [provider]  albuterol (VENTOLIN HFA) 108 (90 Base) MCG/ACT inhaler Inhale 1-2 puffs into the lungs every 6 (six) hours as needed for wheezing or shortness of breath.   Yes [provider]  allopurinol (ZYLOPRIM) 100 MG tablet Take 100 mg by mouth daily. 10/17/20  Yes [provider]  allopurinol (ZYLOPRIM) 300 MG tablet Take 300 mg by mouth daily.    Yes [provider]  aspirin EC 81 MG tablet Take 81 mg by mouth daily.   Yes [provider]  Blood Glucose Monitoring Suppl (GLUCOCOM BLOOD GLUCOSE MONITOR) DEVI See admin instructions. 04/23/19  Yes [provider]  candesartan (ATACAND) 32 MG tablet Take 32 mg by mouth daily. 10/01/18  Yes [provider]  cetirizine (ZYRTEC) 10 MG tablet Take 10 mg by mouth at bedtime.   Yes [provider]  cholecalciferol (VITAMIN D) 1000 units tablet Take 1,000 Units by mouth daily.   Yes [provider]  Cinnamon 500 MG capsule Take 500 mg by mouth daily.   Yes [provider]  docusate  sodium (COLACE) 100 MG capsule Take 1 capsule (100 mg total) by mouth 2 (two) times daily. 12/14/16  Yes Amador Cunas C, PA-C  ESBRIET 267 MG TABS Take 2 tablets by mouth 3 (three) times daily. 03/19/19  Yes [provider]  ferrous gluconate (FERGON) 324 MG tablet Take 324 mg by mouth daily with breakfast. 12/28/13  Yes [provider]  Fluticasone-Salmeterol (ADVAIR) 500-50 MCG/DOSE AEPB Inhale 1 puff into the lungs 2 (two) times daily.    Yes [provider]  gabapentin (NEURONTIN) 300 MG capsule TAKE 2 CAPSULES BY MOUTH TWICE A DAY AND TAKE 3 CAPS AT BEDTIME 03/02/18  Yes [provider]  INCRUSE ELLIPTA 62.5 MCG/INH AEPB Inhale 1 puff into the lungs daily. 12/01/20  Yes [provider]  ipratropium (ATROVENT) 0.02 % nebulizer solution SMARTSIG:2.5 Milliliter(s)  Via Nebulizer 4 Times Daily 11/05/20  Yes [provider]  loratadine (CLARITIN) 10 MG tablet Take by mouth.   Yes [provider]  magnesium oxide (MAG-OX) 400 MG tablet Take 1 tablet by mouth daily. 07/21/20  Yes [provider]  magnesium oxide 400 (240 Mg) MG TABS Take by mouth. 07/21/20 07/21/21 Yes [provider]  metoprolol succinate (TOPROL-XL) 50 MG 24 hr tablet Take 50 mg by mouth daily.   Yes [provider]  Multiple Vitamin (MULTI-VITAMIN) tablet Take 1 tablet by mouth as needed.   Yes [provider]  Omega-3 Fatty Acids (FISH OIL) 1000 MG CAPS Take 1 capsule by mouth daily.    Yes [provider]  omeprazole (PRILOSEC) 20 MG capsule Take 20 mg by mouth daily. 11/05/20  Yes [provider]  rosuvastatin (CRESTOR) 40 MG tablet Take 40 mg by mouth daily. 09/17/20  Yes [provider]  vitamin B-12 (CYANOCOBALAMIN) 1000 MCG tablet Take 1,000 mcg by mouth daily.   Yes [provider]  diphenhydrAMINE (BENADRYL) 25 mg capsule Take 25 mg by mouth as needed for itching.    [provider]   hydrochlorothiazide (HYDRODIURIL) 25 MG tablet Take 25 mg by mouth daily. Patient not taking: Reported on 08/06/2020    [provider]  lisinopril (PRINIVIL,ZESTRIL) 40 MG tablet Take 40 mg by mouth daily.    [provider]  metFORMIN (GLUCOPHAGE) 1000 MG tablet Take 1,000 mg by mouth every evening. Patient not taking: Reported on 08/06/2020    [provider]  NONFORMULARY OR COMPOUNDED ITEM     [provider]  enoxaparin (LOVENOX) 40 MG/0.4ML injection Inject 0.4 mLs (40 mg total) into the skin daily. 12/15/16 03/25/19  Evon Slack, PA-C  pregabalin (LYRICA) 50 MG capsule Take 1 capsule (50 mg total) by mouth 3 (three) times daily. 12/18/16 03/25/19  Lorenso Quarry, NP  ranitidine (ZANTAC) 300 MG capsule Take 300 mg by mouth every evening.  03/25/19  [provider]  simvastatin (ZOCOR) 40 MG tablet Take 40 mg by mouth daily at 6 PM.  03/25/19  [provider]    Allergies Morphine and related  Family History  Problem Relation Age of Onset   Breast cancer Mother 38   Diabetes Mother    Stroke Mother    Stroke Brother    Breast cancer Cousin        pat cousin    Social History Social History   Tobacco Use   Smoking status: Former    Packs/day: 0.25    Years: 10.00    Pack years: 2.50    Types: Cigarettes    Quit date: 08/18/1990    Years since quitting: 30.3   Smokeless tobacco: Never  Vaping Use   Vaping Use: Never used  Substance Use Topics   Alcohol use: No   Drug use: No    Review of Systems Constitutional: No fever/chills Eyes: No visual changes. ENT: No sore throat. Cardiovascular: Denies chest pain. Respiratory: Gradually worsening and now severe shortness of breath particularly with exertion. Gastrointestinal: No abdominal pain.  No nausea, no vomiting.  No diarrhea.  No constipation. Genitourinary: Negative for dysuria. Musculoskeletal: Negative for neck pain.  Negative for back  pain. Integumentary: Negative for rash. Neurological: Negative for headaches, focal weakness or numbness.   ____________________________________________   PHYSICAL EXAM:  VITAL SIGNS: ED Triage Vitals  Enc Vitals Group     BP 12/04/20 1914 130/69     Pulse  Rate 12/04/20 1914 (!) 109     Resp 12/04/20 1914 (!) 22     Temp 12/04/20 1914 99 F (37.2 C)     Temp Source 12/04/20 1914 Oral     SpO2 12/04/20 1914 100 %     Weight --      Height --      Head Circumference --      Peak Flow --      Pain Score 12/04/20 1917 0     Pain Loc --      Pain Edu? --      Excl. in GC? --     Constitutional: Alert and oriented.  Eyes: Conjunctivae are normal.  Head: Atraumatic. Nose: No congestion/rhinnorhea. Mouth/Throat: Patient is wearing a mask. Neck: No stridor.  No meningeal signs.   Cardiovascular: Normal rate, regular rhythm. Good peripheral circulation. Respiratory: Mild tachypnea but with normal respiratory effort (no retractions and no accessory muscle usage).  Significant tachypnea with any amount of exertion.  No wheezing with appropriate air movement upon auscultation.  No significantly coarse breath sounds. Gastrointestinal: Soft and nontender. No distention.  Musculoskeletal: No lower extremity tenderness nor edema. No gross deformities of extremities. Neurologic:  Normal speech and language. No gross focal neurologic deficits are appreciated.  Skin:  Skin is warm, dry and intact. Psychiatric: Mood and affect are normal. Speech and behavior are normal.  ____________________________________________   LABS (all labs ordered are listed, but only abnormal results are displayed)  Labs Reviewed  BASIC METABOLIC PANEL - Abnormal; Notable for the following components:      Result Value   Glucose, Bld 384 (*)    Creatinine, Ser 1.33 (*)    GFR, Estimated 40 (*)    All other components within normal limits  CBC - Abnormal; Notable for the following components:   RBC 3.09  (*)    Hemoglobin 8.4 (*)    HCT 27.0 (*)    RDW 16.8 (*)    All other components within normal limits  URINALYSIS, COMPLETE (UACMP) WITH MICROSCOPIC - Abnormal; Notable for the following components:   Color, Urine YELLOW (*)    APPearance CLEAR (*)    Glucose, UA >=500 (*)    All other components within normal limits  HEPATIC FUNCTION PANEL - Abnormal; Notable for the following components:   Albumin 2.9 (*)    All other components within normal limits  TROPONIN I (HIGH SENSITIVITY) - Abnormal; Notable for the following components:   Troponin I (High Sensitivity) 18 (*)    All other components within normal limits  RESP PANEL BY RT-PCR (FLU A&B, COVID) ARPGX2  CULTURE, BLOOD (SINGLE)  URINE CULTURE  LACTIC ACID, PLASMA  PROTIME-INR  LIPASE, BLOOD  BRAIN NATRIURETIC PEPTIDE  BASIC METABOLIC PANEL  CBC  TROPONIN I (HIGH SENSITIVITY)   ____________________________________________  EKG  ED ECG REPORT I, Loleta Rose, the attending physician, personally viewed and interpreted this ECG.  Date: 12/04/2020 EKG Time: 19:22 Rate: 106 Rhythm: mild sinus tachycardia with occasional PVC QRS Axis: normal Intervals: normal ST/T Wave abnormalities: Non-specific ST segment / T-wave changes, but no clear evidence of acute ischemia. Narrative Interpretation: no definitive evidence of acute ischemia; does not meet STEMI criteria.  ____________________________________________  RADIOLOGY I, Loleta Rose, personally viewed and evaluated these images (plain radiographs) as part of my medical decision making, as well as reviewing the written report by the radiologist.  ED MD interpretation: No acute abnormalities on CXR.  CTA chest shows no acute abnormalities.  Official radiology report(s): CT Angio Chest PE W and/or Wo Contrast  Result Date: 12/05/2020 CLINICAL DATA:  Weakness, confusion, shortness of breath EXAM: CT ANGIOGRAPHY CHEST WITH CONTRAST TECHNIQUE: Multidetector CT imaging of  the chest was performed using the standard protocol during bolus administration of intravenous contrast. Multiplanar CT image reconstructions and MIPs were obtained to evaluate the vascular anatomy. CONTRAST:  75mL OMNIPAQUE IOHEXOL 350 MG/ML SOLN COMPARISON:  CT chest dated 11/03/2020 FINDINGS: Cardiovascular: Satisfactory opacification of the bilateral pulmonary arteries to the segmental level. No evidence of pulmonary embolism. Although not tailored for evaluation of the thoracic aorta, there is no evidence thoracic aortic aneurysm or dissection. The heart is top-normal in size.  No pericardial effusion. Coronary atherosclerosis of the LAD and left circumflex. Mediastinum/Nodes: Mild mediastinal lymphadenopathy, including a 15 mm short axis low right paratracheal node (series 5/image 113), likely reactive. Visualized thyroid is unremarkable. Lungs/Pleura: Subpleural reticulation/fibrosis in the lungs bilaterally, lower lobe predominant with associated bronchiectasis. This appearance is similar to the recent prior, reflecting sequela of chronic interstitial lung disease. Progressive ground-glass in the bilateral lower lobes favor atelectasis due to poor inspiration. No suspicious pulmonary nodules. No focal consolidation. No pleural effusion or pneumothorax. Upper Abdomen: Visualized upper abdomen is notable for a 3.2 cm left upper pole renal cyst. Musculoskeletal: Mild degenerative changes of the visualized thoracolumbar spine. Review of the MIP images confirms the above findings. IMPRESSION: No evidence of pulmonary embolism. Chronic interstitial lung disease. Mild mediastinal lymphadenopathy, likely reactive. Aortic Atherosclerosis (ICD10-I70.0). Electronically Signed   By: Charline BillsSriyesh  Krishnan M.D.   On: 12/05/2020 01:25   DG Chest Port 1 View  Result Date: 12/04/2020 CLINICAL DATA:  Weakness, confusion, shortness of breath EXAM: PORTABLE CHEST 1 VIEW COMPARISON:  CT chest dated 11/03/2020 FINDINGS:  Subpleural reticulation/fibrosis in the lungs bilaterally. Associated lower lobe bronchiectasis. This appearance is unchanged from the prior, reflecting chronic interstitial lung disease. No superimposed consolidation.  No pleural effusion or pneumothorax. The heart is normal in size. IMPRESSION: Stable chronic interstitial lung disease. Electronically Signed   By: Charline BillsSriyesh  Krishnan M.D.   On: 12/04/2020 23:22    ____________________________________________   PROCEDURES   Procedure(s) performed (including Critical Care):  .1-3 Lead EKG Interpretation  Date/Time: 12/05/2020 12:24 AM Performed by: Loleta RoseForbach, Brady Schiller, MD Authorized by: Loleta RoseForbach, Rikki Trosper, MD     Interpretation: abnormal     ECG rate:  105   ECG rate assessment: tachycardic     Rhythm: sinus tachycardia     Ectopy: PVCs     Conduction: normal     ____________________________________________   INITIAL IMPRESSION / MDM / ASSESSMENT AND PLAN / ED COURSE  As part of my medical decision making, I reviewed the following data within the electronic MEDICAL RECORD NUMBER History obtained from family, Nursing notes reviewed and incorporated, Labs reviewed , EKG interpreted , Old chart reviewed, Radiograph reviewed , Discussed with admitting physician , and Notes from prior ED visits   Differential diagnosis includes, but is not limited to, sepsis, CHF, pulmonary fibrosis, ACS, UTI, pneumothorax, pneumonia.  The patient is on the cardiac monitor to evaluate for evidence of arrhythmia and/or significant heart rate changes.  I doubt sepsis but I am initiating a "possible sepsis" work-up.  On 5 L of oxygen at rest, her oxygen saturation is in the mid 90s but when she simply got up from her wheelchair to go to the commode she dropped to 84% almost immediately and became extremely tachypneic and her heart rate increased as well.  Symptoms have been gradually worsening but are more acute recently.  Prior echocardiogram demonstrates an EF of 55% but  unclear if this is worsened acutely.  Denies chest pain, EKG shows no sign of ischemia.  I personally reviewed the patient's imaging and agree with the radiologist's interpretation that there are no acute findings, just chronic disease.  Troponin pending.  CBC is generally reassuring with a mild anemia but not far off of her baseline.  BMP is essentially normal with a very slightly elevated creatinine but again it is very similar to her baseline which seems to be between 1.1 and 1.2 (today it is 1.3).  Additional "possible sepsis work-up is pending".  I asked the nurse to check a rectal temperature and it is normal at 98.6 but the patient has tachypnea and tachycardia and hypoxemia when she moves around.  Given her acceptable creatinine, I will check a CTA chest for further assessment of the lung parenchyma as well as to rule out pulmonary embolism.  Based on her degree of dyspnea I believe she would benefit from admission.     Clinical Course as of 12/05/20 0503  Wynelle Link Dec 05, 2020  0027 Urinalysis, Complete w Microscopic Nasopharyngeal Swab(!) Normal urinalysis with no evidence of infection [CF]  0103 Troponin I (High Sensitivity)(!): 18 Very slightly elevated troponin not likely suggestive of ACS [CF]  0103 Lactic Acid, Venous: 1.5 [CF]  0110 SARS Coronavirus 2 by RT PCR: NEGATIVE [CF]  0144 CT Angio Chest PE W and/or Wo Contrast Unremarkable CTA chest without any clear indication of an acute or emergent condition.  I will consult hospitalist for admission given her severe exertional dyspnea and worsening hypoxemia on home oxygen. [CF]  0202 Discussed case by phone with Dr. Arville Care who will admit [CF]    Clinical Course User Index [CF] Loleta Rose, MD     ____________________________________________  FINAL CLINICAL IMPRESSION(S) / ED DIAGNOSES  Final diagnoses:  Acute on chronic respiratory failure with hypoxemia (HCC)  Exertional dyspnea     MEDICATIONS GIVEN DURING THIS  VISIT:  Medications  enoxaparin (LOVENOX) injection 40 mg (0 mg Subcutaneous Hold 12/05/20 0352)  acetaminophen (TYLENOL) tablet 650 mg (has no administration in time range)    Or  acetaminophen (TYLENOL) suppository 650 mg (has no administration in time range)  traZODone (DESYREL) tablet 25 mg (has no administration in time range)  magnesium hydroxide (MILK OF MAGNESIA) suspension 30 mL (has no administration in time range)  ondansetron (ZOFRAN) tablet 4 mg (has no administration in time range)    Or  ondansetron (ZOFRAN) injection 4 mg (has no administration in time range)  methylPREDNISolone sodium succinate (SOLU-MEDROL) 40 mg/mL injection 40 mg (40 mg Intravenous Given 12/05/20 0350)    Followed by  predniSONE (DELTASONE) tablet 40 mg (has no administration in time range)  sodium chloride 0.9 % bolus 500 mL (0 mLs Intravenous Stopped 12/05/20 0038)  iohexol (OMNIPAQUE) 350 MG/ML injection 75 mL (75 mLs Intravenous Contrast Given 12/05/20 0106)     ED Discharge Orders     None        Note:  This document was prepared using Dragon voice recognition software and may include unintentional dictation errors.   Loleta Rose, MD 12/05/20 [phone]

## 2020-12-05 NOTE — ED Notes (Signed)
CN aware ready bed no RN assigned.

## 2020-12-05 NOTE — ED Notes (Signed)
Sent msg to attending to request PRN cough medicine per pt request for nagging cough.

## 2020-12-05 NOTE — H&P (Addendum)
[place]   PATIENT NAME: Sierra Guzman    MR#:  [id number]  DATE OF BIRTH:  November 25, 1940  DATE OF ADMISSION:  12/04/2020  PRIMARY CARE PHYSICIAN: System, Provider Not In   Patient is coming from: Home  REQUESTING/REFERRING PHYSICIAN: Loleta Rose, MD  CHIEF COMPLAINT:   Chief Complaint  Patient presents with  . Weakness    HISTORY OF PRESENT ILLNESS:  Sierra Guzman is a 80 y.o. African-American African-American female with medical history significant for asthma, stage III chronic kidney disease, coronary artery disease, type 2 diabetes mellitus, gout, dyslipidemia and GERD, presented to the ER with acute onset of worsening dyspnea mainly on exertion over the last couple weeks which have been getting significantly worse over the last couple of days.  She is not able to walk to the bathroom due to severe dyspnea.  She uses 2 to 5 L of O2 by nasal cannula at home.  She was noted that she drops to 84% when she moves on 5 L.  She has been having occasional productive cough but mainly dry as well as occasional wheezing.  She denies any fever or chills.  No chest pain or palpitations.  No nausea or vomiting or abdominal pain.  No dysuria, oliguria or hematuria or flank pain.  ED Course: Upon presenting to the emergency room blood pressure Pulsoxymeter at 84% on 4 L of O2 nasal cannula and respiratory rate was 23 and otherwise vital signs were within normal.  Labs revealed blood glucose of 384 and creatinine 1.33 with BUN of 22 and albumin 2.9 with total protein of 6.8.  BNP was 87.1 and high-sensitivity troponin was 18 and later 17.  Lactic acid was 1.5 and CBC showed anemia slightly worse than previous levels with hemoglobin 8.4 hematocrit 27.  UA showed more than 500 glucose.    EKG as reviewed by me : EKG showed sinus tachycardia with rate of 106 with PVCs and PACs Imaging: Chest x-ray showed stable chronic interstitial lung disease.  The patient was given 500 mill IV normal saline  bolus.  She will be admitted to an observation medical monitored bed for further evaluation and management. PAST MEDICAL HISTORY:   Past Medical History:  Diagnosis Date  . Anemia 10/28/2010   unspecified  . Anxiety   . Aortic valve sclerosis   . Arthritis   . Asthma   . Chronic kidney disease    Chronic Kidney Disease---Stage III  . Coronary artery disease 10/28/2010  . DDD (degenerative disc disease), lumbar 2006  . Diabetes mellitus without complication (HCC)   . Diabetes type 2, controlled (HCC) 10/28/2010  . Dyspnea   . GERD (gastroesophageal reflux disease)   . Gout 10/28/2010  . Heart abnormality   . Heart murmur   . History of cardiac catheterization 2006   LAD 50%, D1 50% rest normal.   . History of cardiovascular stress test 09/23/2009    lexiscan Normal Cardiolite test, EF 61%. LV size and function - normal  . History of echocardiogram 05/27/2008   EF 55%; Mild aortic insufficiency with minimal aortic sclerosis. L vent normal.   . Hyperlipidemia   . Hyperlipidemia LDL goal <100 10/28/2010   managed by Dr. Burr Medico  . Hypertension 10/28/2010  . Interstitial pneumonia (HCC)    unspecified  . Mild vitamin D deficiency 02/22/2011  . Obesity (BMI 30-39.9) 10/28/2010   unspecified  . 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 Personal history of colonic  polyps 10/28/2010  . Shortness of breath   . Syncope 2013  . Vitamin B 12 deficiency 10/28/2010    PAST SURGICAL HISTORY:   Past Surgical History:  Procedure Laterality Date  . BACK SURGERY  2006   DDD  . BREAST BIOPSY Right    neg  . CARDIAC CATHETERIZATION  2006  . EYE SURGERY Bilateral    Cataract Extraction with IOL  . JOINT REPLACEMENT Left    Left Total Hip Replacement  . JOINT REPLACEMENT     RIGHT 2005; LEFT 2 X 2004, 2011  . REPLACEMENT TOTAL KNEE Left   . REVISION TOTAL KNEE ARTHROPLASTY Left    Dr. Ernest Pine  . TONSILLECTOMY    . TOTAL HIP ARTHROPLASTY Right 12/12/2016    Procedure: TOTAL HIP ARTHROPLASTY ANTERIOR APPROACH;  Surgeon: Kennedy Bucker, MD;  Location: [facility] ORS;  Service: Orthopedics;  Laterality: Right;  . TOTAL HIP ARTHROPLASTY Left 12/26/2013   Procedure: ARTHROPLASTY HIP TOTAL; Surgeon: Christell Faith, MD; Location[facility] OR; Service: Orthopedics; Laterality: left,   . TOTAL KNEE ARTHROPLASTY     both knees    SOCIAL HISTORY:   Social History   Tobacco Use  . Smoking status: Former    Packs/day: 0.25    Years: 10.00    Pack years: 2.50    Types: Cigarettes    Quit date: 08/18/1990    Years since quitting: 30.3  . Smokeless tobacco: Never  Substance Use Topics  . Alcohol use: No    FAMILY HISTORY:   Family History  Problem Relation Age of Onset  . Breast cancer Mother 68  . Diabetes Mother   . Stroke Mother   . Stroke Brother   . Breast cancer Cousin        pat cousin    DRUG ALLERGIES:   Allergies  Allergen Reactions  . Morphine And Related Nausea And Vomiting    REVIEW OF SYSTEMS:   ROS As per history of present illness. All pertinent systems were reviewed above. Constitutional, HEENT, cardiovascular, respiratory, GI, GU, musculoskeletal, neuro, psychiatric, endocrine, integumentary and hematologic systems were reviewed and are otherwise negative/unremarkable except for positive findings mentioned above in the HPI.   MEDICATIONS AT HOME:   Prior to Admission medications   Medication Sig Start Date End Date Taking? Authorizing Provider  acetaminophen (TYLENOL) 500 MG tablet Take 2 tablets (1,000 mg total) by mouth every 6 (six) hours as needed for mild pain (or Fever >/= 101). 12/14/16  Yes Evon Slack, PA-C  albuterol (ACCUNEB) 0.63 MG/3ML nebulizer solution SMARTSIG:3 Milliliter(s) Via Nebulizer Every 6 Hours PRN 11/26/20  Yes [provider]  albuterol (VENTOLIN HFA) 108 (90 Base) MCG/ACT inhaler Inhale 1-2 puffs into the lungs every 6 (six) hours as needed for wheezing or shortness of breath.    Yes [provider]  allopurinol (ZYLOPRIM) 100 MG tablet Take 100 mg by mouth daily. 10/17/20  Yes [provider]  allopurinol (ZYLOPRIM) 300 MG tablet Take 300 mg by mouth daily.    Yes [provider]  aspirin EC 81 MG tablet Take 81 mg by mouth daily.   Yes [provider]  Blood Glucose Monitoring Suppl (GLUCOCOM BLOOD GLUCOSE MONITOR) DEVI See admin instructions. 04/23/19  Yes [provider]  candesartan (ATACAND) 32 MG tablet Take 32 mg by mouth daily. 10/01/18  Yes [provider]  cetirizine (ZYRTEC) 10 MG tablet Take 10 mg by mouth at bedtime.   Yes [provider]  cholecalciferol (VITAMIN D)  1000 units tablet Take 1,000 Units by mouth daily.   Yes [provider]  Cinnamon 500 MG capsule Take 500 mg by mouth daily.   Yes [provider]  docusate sodium (COLACE) 100 MG capsule Take 1 capsule (100 mg total) by mouth 2 (two) times daily. 12/14/16  Yes Amador CunasGaines, Thomas C, PA-C  ESBRIET 267 MG TABS Take 2 tablets by mouth 3 (three) times daily. 03/19/19  Yes [provider]  ferrous gluconate (FERGON) 324 MG tablet Take 324 mg by mouth daily with breakfast. 12/28/13  Yes [provider]  Fluticasone-Salmeterol (ADVAIR) 500-50 MCG/DOSE AEPB Inhale 1 puff into the lungs 2 (two) times daily.    Yes [provider]  gabapentin (NEURONTIN) 300 MG capsule TAKE 2 CAPSULES BY MOUTH TWICE A DAY AND TAKE 3 CAPS AT BEDTIME 03/02/18  Yes [provider]  INCRUSE ELLIPTA 62.5 MCG/INH AEPB Inhale 1 puff into the lungs daily. 12/01/20  Yes [provider]  ipratropium (ATROVENT) 0.02 % nebulizer solution SMARTSIG:2.5 Milliliter(s) Via Nebulizer 4 Times Daily 11/05/20  Yes [provider]  loratadine (CLARITIN) 10 MG tablet Take by mouth.   Yes [provider]  magnesium oxide (MAG-OX) 400 MG tablet Take 1 tablet by mouth daily. 07/21/20  Yes [provider]   magnesium oxide 400 (240 Mg) MG TABS Take by mouth. 07/21/20 07/21/21 Yes [provider]  metoprolol succinate (TOPROL-XL) 50 MG 24 hr tablet Take 50 mg by mouth daily.   Yes [provider]  Multiple Vitamin (MULTI-VITAMIN) tablet Take 1 tablet by mouth as needed.   Yes [provider]  Omega-3 Fatty Acids (FISH OIL) 1000 MG CAPS Take 1 capsule by mouth daily.    Yes [provider]  omeprazole (PRILOSEC) 20 MG capsule Take 20 mg by mouth daily. 11/05/20  Yes [provider]  rosuvastatin (CRESTOR) 40 MG tablet Take 40 mg by mouth daily. 09/17/20  Yes [provider]  vitamin B-12 (CYANOCOBALAMIN) 1000 MCG tablet Take 1,000 mcg by mouth daily.   Yes [provider]  diphenhydrAMINE (BENADRYL) 25 mg capsule Take 25 mg by mouth as needed for itching.    [provider]  hydrochlorothiazide (HYDRODIURIL) 25 MG tablet Take 25 mg by mouth daily. Patient not taking: Reported on 08/06/2020    [provider]  lisinopril (PRINIVIL,ZESTRIL) 40 MG tablet Take 40 mg by mouth daily.    [provider]  metFORMIN (GLUCOPHAGE) 1000 MG tablet Take 1,000 mg by mouth every evening. Patient not taking: Reported on 08/06/2020    [provider]  NONFORMULARY OR COMPOUNDED ITEM     [provider]  enoxaparin (LOVENOX) 40 MG/0.4ML injection Inject 0.4 mLs (40 mg total) into the skin daily. 12/15/16 03/25/19  Evon SlackGaines, Thomas C, PA-C  pregabalin (LYRICA) 50 MG capsule Take 1 capsule (50 mg total) by mouth 3 (three) times daily. 12/18/16 03/25/19  Lorenso QuarryLeach, Shannon, NP  ranitidine (ZANTAC) 300 MG capsule Take 300 mg by mouth every evening.  03/25/19  [provider]  simvastatin (ZOCOR) 40 MG tablet Take 40 mg by mouth daily at 6 PM.  03/25/19  [provider]      VITAL SIGNS:  Blood pressure (!) 126/55, pulse 79, temperature 98.7 F (37.1 C), temperature source Rectal, resp. rate 18, SpO2 90  %.  PHYSICAL EXAMINATION:  Physical Exam  GENERAL:  80 y.o.-year-old African-American female patient lying in the bed with mild respiratory distress with conversational dyspnea. EYES: Pupils equal, round,  reactive to light and accommodation. No scleral icterus. Extraocular muscles intact.  HEENT: Head atraumatic, normocephalic. Oropharynx and nasopharynx clear.  NECK:  Supple, no jugular venous distention. No thyroid enlargement, no tenderness.  LUNGS: Slight diminished bibasilar breath sounds with bilateral basilar and midlung zone crackles with occasional wheezing and rhonchi. CARDIOVASCULAR: Regular rate and rhythm, S1, S2 normal. No murmurs, rubs, or gallops.  ABDOMEN: Soft, nondistended, nontender. Bowel sounds present. No organomegaly or mass.  EXTREMITIES: No pedal edema, cyanosis, or clubbing.  NEUROLOGIC: Cranial nerves II through XII are intact. Muscle strength 5/5 in all extremities. Sensation intact. Gait not checked.  PSYCHIATRIC: The patient is alert and oriented x 3.  Normal affect and good eye contact. SKIN: No obvious rash, lesion, or ulcer.   LABORATORY PANEL:   CBC Recent Labs  Lab 12/04/20 1920  WBC 10.3  HGB 8.4*  HCT 27.0*  PLT 329   ------------------------------------------------------------------------------------------------------------------  Chemistries  Recent Labs  Lab 12/04/20 1920  NA 135  K 4.5  CL 102  CO2 24  GLUCOSE 384*  BUN 22  CREATININE 1.33*  CALCIUM 9.1  AST 24  ALT 13  ALKPHOS 55  BILITOT 0.3   ------------------------------------------------------------------------------------------------------------------  Cardiac Enzymes No results for input(s): TROPONINI in the last 168 hours. ------------------------------------------------------------------------------------------------------------------  RADIOLOGY:  CT Angio Chest PE W and/or Wo Contrast  Result Date: 12/05/2020 CLINICAL DATA:  Weakness, confusion, shortness of  breath EXAM: CT ANGIOGRAPHY CHEST WITH CONTRAST TECHNIQUE: Multidetector CT imaging of the chest was performed using the standard protocol during bolus administration of intravenous contrast. Multiplanar CT image reconstructions and MIPs were obtained to evaluate the vascular anatomy. CONTRAST:  22mL OMNIPAQUE IOHEXOL 350 MG/ML SOLN COMPARISON:  CT chest dated 11/03/2020 FINDINGS: Cardiovascular: Satisfactory opacification of the bilateral pulmonary arteries to the segmental level. No evidence of pulmonary embolism. Although not tailored for evaluation of the thoracic aorta, there is no evidence thoracic aortic aneurysm or dissection. The heart is top-normal in size.  No pericardial effusion. Coronary atherosclerosis of the LAD and left circumflex. Mediastinum/Nodes: Mild mediastinal lymphadenopathy, including a 15 mm short axis low right paratracheal node (series 5/image 113), likely reactive. Visualized thyroid is unremarkable. Lungs/Pleura: Subpleural reticulation/fibrosis in the lungs bilaterally, lower lobe predominant with associated bronchiectasis. This appearance is similar to the recent prior, reflecting sequela of chronic interstitial lung disease. Progressive ground-glass in the bilateral lower lobes favor atelectasis due to poor inspiration. No suspicious pulmonary nodules. No focal consolidation. No pleural effusion or pneumothorax. Upper Abdomen: Visualized upper abdomen is notable for a 3.2 cm left upper pole renal cyst. Musculoskeletal: Mild degenerative changes of the visualized thoracolumbar spine. Review of the MIP images confirms the above findings. IMPRESSION: No evidence of pulmonary embolism. Chronic interstitial lung disease. Mild mediastinal lymphadenopathy, likely reactive. Aortic Atherosclerosis (ICD10-I70.0). Electronically Signed   By: Charline Bills M.D.   On: 12/05/2020 01:25   DG Chest Port 1 View  Result Date: 12/04/2020 CLINICAL DATA:  Weakness, confusion, shortness of breath  EXAM: PORTABLE CHEST 1 VIEW COMPARISON:  CT chest dated 11/03/2020 FINDINGS: Subpleural reticulation/fibrosis in the lungs bilaterally. Associated lower lobe bronchiectasis. This appearance is unchanged from the prior, reflecting chronic interstitial lung disease. No superimposed consolidation.  No pleural effusion or pneumothorax. The heart is normal in size. IMPRESSION: Stable chronic interstitial lung disease. Electronically Signed   By: Charline Bills M.D.   On: 12/04/2020 23:22      IMPRESSION AND PLAN:  Active Problems:   Acute on chronic respiratory failure with hypoxemia (  HCC)  1.  Acute on chronic hypoxic respiratory failure likely due to exacerbation of interstitial lung disease/interstitial pulmonary fibrosis. - The patient will be admitted to a medical monitored observation bed. - We will place her on DuoNebs 4 times daily and every 4 hours as needed. - We will place on IV steroid therapy. - Mucolytic therapy will be provided. - I do not believe she needs antibiotic therapy at this time.  2. Mild acute kidney injury superimposed on stage IIIb chronic kidney disease. - The patient will be hydrated with IV normal saline and will follow her BMP. - We will hold off nephrotoxins.  3.  Essential hypertension. - We will place the patient on as needed IV labetalol and hold off HCTZ and ARB and ACE therapy pending improvement of creatinine with hydration.  4.  Type II diabetes mellitus. - We will place the patient on supplement coverage with NovoLog and hold off metformin at this time.  5.  Dyslipidemia. - Statin therapy and fish oil will be continued.  7.  GERD. - PPI therapy will be resumed.   DVT prophylaxis: Lovenox. Code Status: full code. Family Communication:  The plan of care was discussed in details with the patient (and family). I answered all questions. The patient agreed to proceed with the above mentioned plan. Further management will depend upon hospital  course. Disposition Plan: Back to previous home environment Consults called: none. All the records are reviewed and case discussed with ED provider.  Status is: Observation  The patient remains OBS appropriate and will d/c before 2 midnights.  Dispo: The patient is from: Home              Anticipated d/c is to: Home              Patient currently is not medically stable to d/c.   Difficult to place patient No  TOTAL TIME TAKING CARE OF THIS PATIENT: 55 minutes.    Hannah Beat M.D on 12/05/2020 at 2:48 AM  [facility]   From 7 PM-7 AM, contact night-coverage [email or web address]  CC: Primary care physician; System, Provider Not In

## 2020-12-05 NOTE — ED Notes (Signed)
Pt. Place on bedpan, removed from bedpan, linens changed, and fresh chucks, and sheet applied. Pt. Repositioned. Pure wick in place. Resting comfortably, NAD.

## 2020-12-05 NOTE — ED Notes (Signed)
Pt assisted with bed pan and repositioning.

## 2020-12-05 NOTE — ED Notes (Signed)
Pt received breakfast tray at this time

## 2020-12-05 NOTE — Progress Notes (Signed)
PROGRESS NOTE    Sierra NajjarRena M Guzman  ZOX:[id number] DOB: 1940-08-28 DOA: 12/04/2020 PCP: System, Provider Not In   Brief Narrative:  Sierra Guzman is a 80 y.o. 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  Patient admitted for acute on chronic hypoxic respiratory failure in setting of ILD flare with profound ambulatory dysfunction hypoxia and dyspnea with exertion.  Assessment & Plan:  Acute on chronic hypoxic respiratory failure likely due to exacerbation of interstitial lung disease/interstitial pulmonary fibrosis. -Continue nebs, steroids, supportive care, hold antibiotics at this time -Continue respiratory therapy, incentive spirometry, flutter -Early ambulation -Continue to wean oxygen back to home baseline of 2 L at rest and 5 L with exertion -We will likely need close follow-up in the outpatient setting with pulmonology  Acute kidney injury superimposed on stage IIIb chronic kidney disease. -Continue IV fluids, supportive care, creatinine appears to be improving back to baseline -Once tolerating p.o. appropriately will discontinue IV fluids  Essential hypertension. - We will place the patient on as needed IV labetalol  -Holding home HCTZ and ARB and ACE - unclear why patient is on both ARB and ACE inhibitor, will need to follow-up with PCP.  Type II diabetes mellitus. - We will place the patient on supplement coverage with NovoLog and hold off metformin at this time.  Dyslipidemia. - Statin therapy and fish oil will be continued.  GERD. - PPI therapy will be resumed.     DVT prophylaxis:  Lovenox. Code Status: full code. Family Communication:  The plan of care was discussed in details with the patient and family at bedside  Status is: Inpatient  Dispo: The patient is from: Home              Anticipated d/c is to: To be determined              Anticipated d/c date is: 48 to 72 hours              Patient currently not medically stable for discharge  Consultants:  None  Procedures:  None  Antimicrobials:  None  Subjective: No acute issues or events overnight denies nausea vomiting diarrhea constipation headache fevers chills or chest pain.  Dyspnea ongoing but markedly improved, not yet back to baseline  Objective: Vitals:   12/05/20 0615 12/05/20 0630 12/05/20 0645 12/05/20 0700  BP:  (!) 119/55  (!) 106/58  Pulse: (!) 56 (!) 54 (!) 54 62  Resp: 20 (!) 24 19 20   Temp:      TempSrc:      SpO2: 100% 99% 100% 100%  Weight:        Intake/Output Summary (Last 24 hours) at 12/05/2020 0757 Last data filed at 12/05/2020 0038 Gross per 24 hour  Intake 500 ml  Output 50 ml  Net 450 ml   Filed Weights   12/05/20 0540  Weight: 70 kg    Examination:  General:  Pleasantly resting in bed, No acute distress. HEENT:  Normocephalic atraumatic.  Sclerae nonicteric, noninjected.  Extraocular movements intact bilaterally. Neck:  Without mass or deformity.  Trachea is midline. Lungs: Diffuse bilateral rhonchi without overt wheeze or rales. Heart:  Regular rate and rhythm.  Without murmurs, rubs, or gallops. Abdomen:  Soft, nontender, nondistended.  Without guarding or rebound. Extremities: Without cyanosis, clubbing, edema, or obvious deformity. Vascular:  Dorsalis pedis and posterior tibial pulses palpable bilaterally. Skin:  Warm and dry, no erythema, no ulcerations.   Data Reviewed: I have personally reviewed following labs and imaging studies  CBC: Recent Labs  Lab 12/04/20 1920 12/05/20 0623  WBC 10.3 8.9  HGB 8.4* 7.4*  HCT 27.0* 24.2*  MCV 87.4 86.4   PLT 329 280   Basic Metabolic Panel: Recent Labs  Lab 12/04/20 1920 12/05/20 0623  NA 135 138  K 4.5 4.3  CL 102 107  CO2 24 25  GLUCOSE 384* 221*  BUN 22 19  CREATININE 1.33* 1.19*  CALCIUM 9.1 8.9   GFR: Estimated Creatinine Clearance: 36.4 mL/min (A) (by C-G formula based on SCr of 1.19 mg/dL (H)). Liver Function Tests: Recent Labs  Lab 12/04/20 1920  AST 24  ALT 13  ALKPHOS 55  BILITOT 0.3  PROT 6.8  ALBUMIN 2.9*   Recent Labs  Lab 12/04/20 2330  LIPASE 28   No results for input(s): AMMONIA in the last 168 hours. Coagulation Profile: Recent Labs  Lab 12/04/20 2330  INR 1.1   Cardiac Enzymes: No results for input(s): CKTOTAL, CKMB, CKMBINDEX, TROPONINI in the last 168 hours. BNP (last 3 results) No results for input(s): PROBNP in the last 8760 hours. HbA1C: No results for input(s): HGBA1C in the last 72 hours. CBG: Recent Labs  Lab 12/05/20 0726  GLUCAP 241*   Lipid Profile: No results for input(s): CHOL, HDL, LDLCALC, TRIG, CHOLHDL, LDLDIRECT in the last 72 hours. Thyroid Function Tests: No results for input(s): TSH, T4TOTAL, FREET4, T3FREE, THYROIDAB in the last 72 hours. Anemia Panel: No results for input(s): VITAMINB12, FOLATE, FERRITIN, TIBC, IRON, RETICCTPCT in the last 72 hours. Sepsis Labs: Recent Labs  Lab 12/04/20 2330  LATICACIDVEN 1.5    Recent Results (from the past 240 hour(s))  Blood culture (routine single)     Status: None (Preliminary result)   Collection Time: 12/04/20 11:30 PM   Specimen: BLOOD  Result Value Ref Range Status   Specimen Description BLOOD BLOOD LEFT HAND  Final   Special Requests   Final    BOTTLES DRAWN AEROBIC AND ANAEROBIC Blood Culture adequate volume   Culture   Final    NO GROWTH < 12 HOURS Performed at [facility], [address] [place], [place] [address]    Report Status PENDING  Incomplete  Resp Panel by RT-PCR (Flu A&B, Covid) Nasopharyngeal Swab     Status: None   Collection  Time: 12/04/20 11:30 PM   Specimen: Nasopharyngeal Swab; Nasopharyngeal(NP) swabs in vial transport medium  Result Value Ref Range Status   SARS Coronavirus 2 by RT PCR NEGATIVE NEGATIVE Final    Comment: (NOTE) SARS-CoV-2 target nucleic acids are NOT DETECTED.  The SARS-CoV-2 RNA is generally detectable in upper respiratory specimens during the acute phase of infection. The lowest concentration of SARS-CoV-2 viral copies this assay can detect is 138 copies/mL. A negative result does not preclude SARS-Cov-2 infection and should not be used as the sole basis for treatment or other patient management decisions. A negative result may occur with  improper specimen collection/handling, submission of specimen other than nasopharyngeal swab, presence of viral mutation(s) within the areas targeted by this assay, and inadequate number of viral copies(<138 copies/mL). A negative result must be combined with clinical observations, patient history,  and epidemiological information. The expected result is Negative.  Fact Sheet for Patients:  BloggerCourse.com  Fact Sheet for Healthcare Providers:  SeriousBroker.it  This test is no t yet approved or cleared by the [place] FDA and  has been authorized for detection and/or diagnosis of SARS-CoV-2 by FDA under an Emergency Use Authorization (EUA). This EUA will remain  in effect (meaning this test can be used) for the duration of the COVID-19 declaration under Section 564(b)(1) of the Act, 21 U.S.C.section 360bbb-3(b)(1), unless the authorization is terminated  or revoked sooner.       Influenza A by PCR NEGATIVE NEGATIVE Final   Influenza B by PCR NEGATIVE NEGATIVE Final    Comment: (NOTE) The Xpert Xpress SARS-CoV-2/FLU/RSV plus assay is intended as an aid in the diagnosis of influenza from Nasopharyngeal swab specimens and should not be used as a sole basis for treatment. Nasal washings  and aspirates are unacceptable for Xpert Xpress SARS-CoV-2/FLU/RSV testing.  Fact Sheet for Patients: BloggerCourse.com  Fact Sheet for Healthcare Providers: SeriousBroker.it  This test is not yet approved or cleared by the [place] FDA and has been authorized for detection and/or diagnosis of SARS-CoV-2 by FDA under an Emergency Use Authorization (EUA). This EUA will remain in effect (meaning this test can be used) for the duration of the COVID-19 declaration under Section 564(b)(1) of the Act, 21 U.S.C. section 360bbb-3(b)(1), unless the authorization is terminated or revoked.  Performed at [facility], [address] [place], [place] [address]          Radiology Studies: CT Angio Chest PE W and/or Wo Contrast  Result Date: 12/05/2020 CLINICAL DATA:  Weakness, confusion, shortness of breath EXAM: CT ANGIOGRAPHY CHEST WITH CONTRAST TECHNIQUE: Multidetector CT imaging of the chest was performed using the standard protocol during bolus administration of intravenous contrast. Multiplanar CT image reconstructions and MIPs were obtained to evaluate the vascular anatomy. CONTRAST:  73mL OMNIPAQUE IOHEXOL 350 MG/ML SOLN COMPARISON:  CT chest dated 11/03/2020 FINDINGS: Cardiovascular: Satisfactory opacification of the bilateral pulmonary arteries to the segmental level. No evidence of pulmonary embolism. Although not tailored for evaluation of the thoracic aorta, there is no evidence thoracic aortic aneurysm or dissection. The heart is top-normal in size.  No pericardial effusion. Coronary atherosclerosis of the LAD and left circumflex. Mediastinum/Nodes: Mild mediastinal lymphadenopathy, including a 15 mm short axis low right paratracheal node (series 5/image 113), likely reactive. Visualized thyroid is unremarkable. Lungs/Pleura: Subpleural reticulation/fibrosis in the lungs bilaterally, lower lobe predominant with associated  bronchiectasis. This appearance is similar to the recent prior, reflecting sequela of chronic interstitial lung disease. Progressive ground-glass in the bilateral lower lobes favor atelectasis due to poor inspiration. No suspicious pulmonary nodules. No focal consolidation. No pleural effusion or pneumothorax. Upper Abdomen: Visualized upper abdomen is notable for a 3.2 cm left upper pole renal cyst. Musculoskeletal: Mild degenerative changes of the visualized thoracolumbar spine. Review of the MIP images confirms the above findings. IMPRESSION: No evidence of pulmonary embolism. Chronic interstitial lung disease. Mild mediastinal lymphadenopathy, likely reactive. Aortic Atherosclerosis (ICD10-I70.0). Electronically Signed   By: Charline Bills M.D.   On: 12/05/2020 01:25   DG Chest Port 1 View  Result Date: 12/04/2020 CLINICAL DATA:  Weakness, confusion, shortness of breath EXAM: PORTABLE CHEST 1 VIEW COMPARISON:  CT chest dated 11/03/2020 FINDINGS: Subpleural reticulation/fibrosis in the lungs bilaterally. Associated lower lobe bronchiectasis. This appearance is unchanged from the prior, reflecting chronic interstitial lung disease. No superimposed consolidation.  No pleural effusion or  pneumothorax. The heart is normal in size. IMPRESSION: Stable chronic interstitial lung disease. Electronically Signed   By: Charline Bills M.D.   On: 12/04/2020 23:22     Scheduled Meds:  enoxaparin (LOVENOX) injection  40 mg Subcutaneous Q24H   insulin aspart  0-20 Units Subcutaneous TID AC & HS   methylPREDNISolone (SOLU-MEDROL) injection  40 mg Intravenous Q8H   Followed by   Melene Muller ON 12/06/2020] predniSONE  40 mg Oral Q breakfast     LOS: 0 days   Time spent:  Azucena Fallen, DO [facility]  If 7PM-7AM, please contact night-coverage [email or web address]  12/05/2020, 7:57 AM

## 2020-12-05 NOTE — ED Notes (Signed)
Found remote and gave to pt.

## 2020-12-05 NOTE — ED Notes (Signed)
Dr. Mansy at bedside. 

## 2020-12-05 NOTE — Plan of Care (Signed)

## 2020-12-05 NOTE — ED Notes (Signed)
Pt. Provided more wram blankets, daughter updated on admission and POC. Pt. Resting, NAD.

## 2020-12-06 LAB — BASIC METABOLIC PANEL
Anion gap: 6 (ref 5–15)
BUN: 27 mg/dL — ABNORMAL HIGH (ref 8–23)
CO2: 25 mmol/L (ref 22–32)
Calcium: 8.8 mg/dL — ABNORMAL LOW (ref 8.9–10.3)
Chloride: 101 mmol/L (ref 98–111)
Creatinine, Ser: 1.27 mg/dL — ABNORMAL HIGH (ref 0.44–1.00)
GFR, Estimated: 42 mL/min — ABNORMAL LOW (ref >60)
Glucose, Bld: 195 mg/dL — ABNORMAL HIGH (ref 70–99)
Potassium: 4.4 mmol/L (ref 3.5–5.1)
Sodium: 132 mmol/L — ABNORMAL LOW (ref 135–145)

## 2020-12-06 LAB — CBC
HCT: 23.6 % — ABNORMAL LOW (ref 36.0–46.0)
Hemoglobin: 7.3 g/dL — ABNORMAL LOW (ref 12.0–15.0)
MCH: 27 pg (ref 26.0–34.0)
MCHC: 30.9 g/dL (ref 30.0–36.0)
MCV: 87.4 fL (ref 80.0–100.0)
Platelets: 309 10*3/uL (ref 150–400)
RBC: 2.7 MIL/uL — ABNORMAL LOW (ref 3.87–5.11)
RDW: 16.6 % — ABNORMAL HIGH (ref 11.5–15.5)
WBC: 10.9 10*3/uL — ABNORMAL HIGH (ref 4.0–10.5)
nRBC: 0 % (ref 0.0–0.2)

## 2020-12-06 LAB — GLUCOSE, CAPILLARY
Glucose-Capillary: 180 mg/dL — ABNORMAL HIGH (ref 70–99)
Glucose-Capillary: 230 mg/dL — ABNORMAL HIGH (ref 70–99)
Glucose-Capillary: 259 mg/dL — ABNORMAL HIGH (ref 70–99)
Glucose-Capillary: 141 mg/dL — ABNORMAL HIGH (ref 70–99)

## 2020-12-06 LAB — URINE CULTURE: Culture: NO GROWTH

## 2020-12-06 NOTE — Evaluation (Signed)
Physical Therapy Evaluation Patient Details Name: Sierra Guzman MRN: [id number] DOB: [birth year] Today's Date: 12/06/2020   History of Present Illness  Pt is an 80 y/o F admitted on 12/04/20 with c/c of DOE that has been worsening over the past couple days. Pt is being treated for acute on chronic hypoxic respiratory failure likely due to exacerbation of interstitial lung disease/interstitial pulmonary fibrosis. PMH: asthma, stage 3 CKD, CAD, DM2, gout, dyslipidemia, GERD  Clinical Impression  Pt seen for PT evaluation with pt reporting she was mod I with rollator & still driving until 2-3 weeks ago when pt has been only able to ambulate 20 ft at a time 2/2 DOE. On this date, pt is able to complete bed mobility with mod I, transfers with supervision<>CGA & gait in room with RW & supervision. Pt is limited by SOB with mobility & O2 desaturation after task. Pt would benefit from assistance at d/c but lives alone so recommending STR to maximize independence with mobility prior to return home alone.   Pt on 4L/min via nasal cannula at rest, SpO2 >90% After transferring to recliner SpO2 dropped to 80% but pt able to recover to >90% with prolonged seated rest break After gait SpO2 dropped to 78% on 4L/min so increased to 5L/min & after prolonged rest break pt titrated back down to 4L/min and SpO2 89-91% at rest in recliner - nurse notified HR up to 135 bpm with gait    Follow Up Recommendations SNF;Supervision for mobility/OOB    Equipment Recommendations  None recommended by PT    Recommendations for Other Services       Precautions / Restrictions Precautions Precautions: Fall Restrictions Weight Bearing Restrictions: No      Mobility  Bed Mobility Overal bed mobility: Modified Independent             General bed mobility comments: supine>sit with HOB elevated, bed rails    Transfers Overall transfer level: Needs assistance Equipment used: Rolling walker (2 wheeled) Transfers:  Sit to/from [employer] Sit to Stand: Supervision Stand pivot transfers: Supervision;Min guard       General transfer comment: cuing to push to standing  Ambulation/Gait Ambulation/Gait assistance: Supervision Gait Distance (Feet): 10 Feet Assistive device: Rolling walker (2 wheeled) Gait Pattern/deviations: Decreased stride length Gait velocity: decreased      Stairs            Wheelchair Mobility    Modified Rankin (Stroke Patients Only)       Balance Overall balance assessment: Needs assistance Sitting-balance support: Feet supported;Bilateral upper extremity supported Sitting balance-Leahy Scale: Normal     Standing balance support: Bilateral upper extremity supported;During functional activity Standing balance-Leahy Scale: Good                               Pertinent Vitals/Pain Pain Assessment: No/denies pain    Home Living Family/patient expects to be discharged to:: Private residence Living Arrangements: Alone Available Help at Discharge: Family;Available PRN/intermittently Type of Home: House Home Access: Ramped entrance     Home Layout: Multi-level Home Equipment:  (rollator)      Prior Function           Comments: Mod I with rollator, household ambulator, still driving (due to pt's decline over the past 2-3 weeks pt only able to walk ~20 ft at one time before sitting on rollator 2/2 DOE)     Hand Dominance  Extremity/Trunk Assessment   Upper Extremity Assessment Upper Extremity Assessment: Overall WFL for tasks assessed    Lower Extremity Assessment Lower Extremity Assessment: Generalized weakness       Communication   Communication: No difficulties  Cognition Arousal/Alertness: Awake/alert Behavior During Therapy: WFL for tasks assessed/performed Overall Cognitive Status: Within Functional Limits for tasks assessed                                        General  Comments      Exercises     Assessment/Plan    PT Assessment Patient needs continued PT services  PT Problem List Decreased strength;Decreased mobility;Cardiopulmonary status limiting activity;Decreased activity tolerance;Decreased balance       PT Treatment Interventions DME instruction;Therapeutic activities;Modalities;Gait training;Therapeutic exercise;Patient/family education;Stair training;Balance training;Functional mobility training;Neuromuscular re-education;Manual techniques    PT Goals (Current goals can be found in the Care Plan section)  Acute Rehab PT Goals Patient Stated Goal: get better, figure out what's causing this PT Goal Formulation: With patient Time For Goal Achievement: 12/20/20 Potential to Achieve Goals: Good    Frequency Min 2X/week   Barriers to discharge Inaccessible home environment;Decreased caregiver support lives alone, 3 steps within house    Co-evaluation               AM-PAC PT "6 Clicks" Mobility  Outcome Measure Help needed turning from your back to your side while in a flat bed without using bedrails?: A Little Help needed moving from lying on your back to sitting on the side of a flat bed without using bedrails?: A Little Help needed moving to and from a bed to a chair (including a wheelchair)?: A Little Help needed standing up from a chair using your arms (e.g., wheelchair or bedside chair)?: A Little Help needed to walk in hospital room?: A Little Help needed climbing 3-5 steps with a railing? : A Lot 6 Click Score: 17    End of Session Equipment Utilized During Treatment: Gait belt;Oxygen Activity Tolerance: Patient tolerated treatment well (limited by O2) Patient left: in chair;with chair alarm set;with call bell/phone within reach Nurse Communication: Mobility status (O2) PT Visit Diagnosis: Muscle weakness (generalized) (M62.81)    Time: 1962-2297 PT Time Calculation (min) (ACUTE ONLY): 32 min   Charges:   PT  Evaluation $PT Eval Moderate Complexity: 1 Mod PT Treatments $Therapeutic Activity: 23-37 mins        Aleda Grana, PT, DPT 12/06/20, 3:32 PM   [name] 12/06/2020, 3:30 PM

## 2020-12-06 NOTE — Progress Notes (Signed)
PROGRESS NOTE    Sierra Guzman  QQI:[id number] DOB: 03/28/1941 DOA: 12/04/2020 PCP: System, Provider Not In   Brief Narrative:  Sierra Guzman is a 80 y.o. 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  Patient admitted for acute on chronic hypoxic respiratory failure in setting of ILD flare with profound ambulatory dysfunction hypoxia and dyspnea with exertion.  Assessment & Plan:  Acute on chronic hypoxic respiratory failure likely due to exacerbation of interstitial lung disease/interstitial pulmonary fibrosis. -Continue nebs, steroids, supportive care, hold antibiotics at this time -Continue respiratory therapy, incentive spirometry, flutter -Early ambulation -Continue to wean oxygen back to home baseline of 2 L at rest and 5 L with exertion -We will likely need close follow-up in the outpatient setting with pulmonology  Acute kidney injury superimposed on stage IIIb chronic kidney disease. -Continue IV fluids, supportive care, creatinine appears to be improving back to baseline -Once tolerating p.o. appropriately will discontinue IV fluids  Essential hypertension. - We will place the patient on as needed IV labetalol  -Holding home HCTZ and ARB and ACE - unclear why patient is on both ARB and ACE inhibitor, will need to follow-up with PCP.  Type II diabetes mellitus, uncontrolled with hyperglycemia. - We will place the patient on supplement coverage with NovoLog and hold off metformin at this time. -A1c 8.1 -lengthy discussion about dietary compliance  Dyslipidemia. - Statin therapy and fish oil  continued.  GERD. - PPI therapy will be resumed.    DVT prophylaxis: Lovenox. Code Status: full code. Family Communication:  The plan of care was discussed in details with the patient and family at bedside  Status is: Inpatient  Dispo: The patient is from: Home              Anticipated d/c is to: To be determined              Anticipated d/c date is: 24-48 hours              Patient currently not medically stable for discharge  Consultants:  None  Procedures:  None  Antimicrobials:  None  Subjective: No acute issues or events overnight denies nausea vomiting diarrhea constipation headache fevers chills or chest pain.  Dyspnea ongoing but markedly improved, not yet back to baseline  Objective: Vitals:   12/05/20 1800 12/05/20 1846 12/06/20 0348 12/06/20 0715  BP: (!) 153/64 (!) 157/79 134/65 140/60  Pulse: 61 71 72 63  Resp: 18 20 18  (!) 24  Temp:  98.4 F (36.9 C) 97.7 F (36.5 C) 98 F (36.7 C)  TempSrc:   Oral Oral  SpO2: 95% 100% 94% 100%  Weight:        Intake/Output Summary (Last 24 hours) at 12/06/2020 0809 Last data filed at 12/06/2020 0354 Gross per 24 hour  Intake 750 ml  Output 1600 ml  Net -850 ml    Filed Weights   12/05/20 0540  Weight: 70 kg    Examination:  General:  Pleasantly resting in bed, No acute distress. HEENT:  Normocephalic atraumatic.  Sclerae nonicteric, noninjected.  Extraocular movements intact bilaterally. Neck:  Without mass or deformity.  Trachea  is midline. Lungs: Diffuse bilateral rhonchi without overt wheeze or rales. Heart:  Regular rate and rhythm.  Without murmurs, rubs, or gallops. Abdomen:  Soft, nontender, nondistended.  Without guarding or rebound. Extremities: Without cyanosis, clubbing, edema, or obvious deformity. Vascular:  Dorsalis pedis and posterior tibial pulses palpable bilaterally. Skin:  Warm and dry, no erythema, no ulcerations.   Data Reviewed: I have personally reviewed following labs and  imaging studies  CBC: Recent Labs  Lab 12/04/20 1920 12/05/20 0623 12/06/20 0520  WBC 10.3 8.9 10.9*  HGB 8.4* 7.4* 7.3*  HCT 27.0* 24.2* 23.6*  MCV 87.4 86.4 87.4  PLT 329 280 309    Basic Metabolic Panel: Recent Labs  Lab 12/04/20 1920 12/05/20 0623 12/06/20 0520  NA 135 138 132*  K 4.5 4.3 4.4  CL 102 107 101  CO2 24 25 25   GLUCOSE 384* 221* 195*  BUN 22 19 27*  CREATININE 1.33* 1.19* 1.27*  CALCIUM 9.1 8.9 8.8*    GFR: Estimated Creatinine Clearance: 34.1 mL/min (A) (by C-G formula based on SCr of 1.27 mg/dL (H)). Liver Function Tests: Recent Labs  Lab 12/04/20 1920  AST 24  ALT 13  ALKPHOS 55  BILITOT 0.3  PROT 6.8  ALBUMIN 2.9*    Recent Labs  Lab 12/04/20 2330  LIPASE 28    No results for input(s): AMMONIA in the last 168 hours. Coagulation Profile: Recent Labs  Lab 12/04/20 2330  INR 1.1    Cardiac Enzymes: No results for input(s): CKTOTAL, CKMB, CKMBINDEX, TROPONINI in the last 168 hours. BNP (last 3 results) No results for input(s): PROBNP in the last 8760 hours. HbA1C: Recent Labs    12/05/20 0623  HGBA1C 8.1*   CBG: Recent Labs  Lab 12/05/20 0726 12/05/20 1239 12/05/20 1649 12/05/20 2018  GLUCAP 241* 284* 134* 222*    Lipid Profile: No results for input(s): CHOL, HDL, LDLCALC, TRIG, CHOLHDL, LDLDIRECT in the last 72 hours. Thyroid Function Tests: No results for input(s): TSH, T4TOTAL, FREET4, T3FREE, THYROIDAB in the last 72 hours. Anemia Panel: No results for input(s): VITAMINB12, FOLATE, FERRITIN, TIBC, IRON, RETICCTPCT in the last 72 hours. Sepsis Labs: Recent Labs  Lab 12/04/20 2330  LATICACIDVEN 1.5     Recent Results (from the past 240 hour(s))  Blood culture (routine single)     Status: None (Preliminary result)   Collection Time: 12/04/20 11:30 PM   Specimen: BLOOD  Result Value Ref Range Status   Specimen Description BLOOD BLOOD LEFT HAND  Final   Special Requests   Final    BOTTLES DRAWN AEROBIC  AND ANAEROBIC Blood Culture adequate volume   Culture   Final    NO GROWTH 1 DAY Performed at [facility], [address], [place], [place]    Report Status PENDING  Incomplete  Urine Culture     Status: None   Collection Time: 12/04/20 11:30 PM   Specimen: In/Out Cath Urine  Result Value Ref Range Status   Specimen Description   Final    IN/OUT CATH URINE Performed at [facility], [address], [place], [place]    Special Requests   Final    NONE Performed at [facility], [address] [place], [place]    Culture   Final    NO GROWTH Performed at [facility], [address] [place], [place]    Report Status 12/06/2020 FINAL  Final  Resp Panel by RT-PCR (Flu  A&B, Covid) Nasopharyngeal Swab     Status: None   Collection Time: 12/04/20 11:30 PM   Specimen: Nasopharyngeal Swab; Nasopharyngeal(NP) swabs in vial transport medium  Result Value Ref Range Status   SARS Coronavirus 2 by RT PCR NEGATIVE NEGATIVE Final    Comment: (NOTE) SARS-CoV-2 target nucleic acids are NOT DETECTED.  The SARS-CoV-2 RNA is generally detectable in upper respiratory specimens during the acute phase of infection. The lowest concentration of SARS-CoV-2 viral copies this assay can detect is 138 copies/mL. A negative result does not preclude SARS-Cov-2 infection and should not be used as the sole basis for treatment or other patient management decisions. A negative result may occur with  improper specimen collection/handling, submission of specimen other than nasopharyngeal swab, presence of viral mutation(s) within the areas targeted by this assay, and inadequate number of viral copies(<138 copies/mL). A negative result must be combined with clinical observations, patient history, and epidemiological information. The expected result is Negative.  Fact Sheet for Patients:   BloggerCourse.com  Fact Sheet for Healthcare Providers:  SeriousBroker.it  This test is no t yet approved or cleared by the [place] FDA and  has been authorized for detection and/or diagnosis of SARS-CoV-2 by FDA under an Emergency Use Authorization (EUA). This EUA will remain  in effect (meaning this test can be used) for the duration of the COVID-19 declaration under Section 564(b)(1) of the Act, 21 U.S.C.section 360bbb-3(b)(1), unless the authorization is terminated  or revoked sooner.       Influenza A by PCR NEGATIVE NEGATIVE Final   Influenza B by PCR NEGATIVE NEGATIVE Final    Comment: (NOTE) The Xpert Xpress SARS-CoV-2/FLU/RSV plus assay is intended as an aid in the diagnosis of influenza from Nasopharyngeal swab specimens and should not be used as a sole basis for treatment. Nasal washings and aspirates are unacceptable for Xpert Xpress SARS-CoV-2/FLU/RSV testing.  Fact Sheet for Patients: BloggerCourse.com  Fact Sheet for Healthcare Providers: SeriousBroker.it  This test is not yet approved or cleared by the [place] FDA and has been authorized for detection and/or diagnosis of SARS-CoV-2 by FDA under an Emergency Use Authorization (EUA). This EUA will remain in effect (meaning this test can be used) for the duration of the COVID-19 declaration under Section 564(b)(1) of the Act, 21 U.S.C. section 360bbb-3(b)(1), unless the authorization is terminated or revoked.  Performed at [facility], [address] [place], [place] [address]           Radiology Studies: CT Angio Chest PE W and/or Wo Contrast  Result Date: 12/05/2020 CLINICAL DATA:  Weakness, confusion, shortness of breath EXAM: CT ANGIOGRAPHY CHEST WITH CONTRAST TECHNIQUE: Multidetector CT imaging of the chest was performed using the standard protocol during bolus administration  of intravenous contrast. Multiplanar CT image reconstructions and MIPs were obtained to evaluate the vascular anatomy. CONTRAST:  20mL OMNIPAQUE IOHEXOL 350 MG/ML SOLN COMPARISON:  CT chest dated 11/03/2020 FINDINGS: Cardiovascular: Satisfactory opacification of the bilateral pulmonary arteries to the segmental level. No evidence of pulmonary embolism. Although not tailored for evaluation of the thoracic aorta, there is no evidence thoracic aortic aneurysm or dissection. The heart is top-normal in size.  No pericardial effusion. Coronary atherosclerosis of the LAD and left circumflex. Mediastinum/Nodes: Mild mediastinal lymphadenopathy, including a 15 mm short axis low right paratracheal node (series 5/image 113), likely reactive. Visualized thyroid is unremarkable. Lungs/Pleura: Subpleural reticulation/fibrosis in the lungs bilaterally, lower lobe predominant with associated bronchiectasis. This appearance is similar to the recent prior,  reflecting sequela of chronic interstitial lung disease. Progressive ground-glass in the bilateral lower lobes favor atelectasis due to poor inspiration. No suspicious pulmonary nodules. No focal consolidation. No pleural effusion or pneumothorax. Upper Abdomen: Visualized upper abdomen is notable for a 3.2 cm left upper pole renal cyst. Musculoskeletal: Mild degenerative changes of the visualized thoracolumbar spine. Review of the MIP images confirms the above findings. IMPRESSION: No evidence of pulmonary embolism. Chronic interstitial lung disease. Mild mediastinal lymphadenopathy, likely reactive. Aortic Atherosclerosis (ICD10-I70.0). Electronically Signed   By: Charline BillsSriyesh  Krishnan M.D.   On: 12/05/2020 01:25   DG Chest Port 1 View  Result Date: 12/04/2020 CLINICAL DATA:  Weakness, confusion, shortness of breath EXAM: PORTABLE CHEST 1 VIEW COMPARISON:  CT chest dated 11/03/2020 FINDINGS: Subpleural reticulation/fibrosis in the lungs bilaterally. Associated lower lobe  bronchiectasis. This appearance is unchanged from the prior, reflecting chronic interstitial lung disease. No superimposed consolidation.  No pleural effusion or pneumothorax. The heart is normal in size. IMPRESSION: Stable chronic interstitial lung disease. Electronically Signed   By: Charline BillsSriyesh  Krishnan M.D.   On: 12/04/2020 23:22     Scheduled Meds:  enoxaparin (LOVENOX) injection  40 mg Subcutaneous Q24H   insulin aspart  0-20 Units Subcutaneous TID AC & HS   predniSONE  40 mg Oral Q breakfast     LOS: 1 day   Time spent: 40min  Azucena FallenWilliam C Aslan Himes, DO [facility]  If 7PM-7AM, please contact night-coverage [email or web address]  12/06/2020, 8:09 AM

## 2020-12-07 LAB — CBC
HCT: 25.2 % — ABNORMAL LOW (ref 36.0–46.0)
Hemoglobin: 7.8 g/dL — ABNORMAL LOW (ref 12.0–15.0)
MCH: 27 pg (ref 26.0–34.0)
MCHC: 31 g/dL (ref 30.0–36.0)
MCV: 87.2 fL (ref 80.0–100.0)
Platelets: 335 10*3/uL (ref 150–400)
RBC: 2.89 MIL/uL — ABNORMAL LOW (ref 3.87–5.11)
RDW: 16.7 % — ABNORMAL HIGH (ref 11.5–15.5)
WBC: 11.7 10*3/uL — ABNORMAL HIGH (ref 4.0–10.5)
nRBC: 0 % (ref 0.0–0.2)

## 2020-12-07 LAB — GLUCOSE, CAPILLARY
Glucose-Capillary: 119 mg/dL — ABNORMAL HIGH (ref 70–99)
Glucose-Capillary: 343 mg/dL — ABNORMAL HIGH (ref 70–99)
Glucose-Capillary: 84 mg/dL (ref 70–99)

## 2020-12-07 LAB — BASIC METABOLIC PANEL
Anion gap: 8 (ref 5–15)
BUN: 28 mg/dL — ABNORMAL HIGH (ref 8–23)
CO2: 26 mmol/L (ref 22–32)
Calcium: 9 mg/dL (ref 8.9–10.3)
Chloride: 100 mmol/L (ref 98–111)
Creatinine, Ser: 1.15 mg/dL — ABNORMAL HIGH (ref 0.44–1.00)
GFR, Estimated: 48 mL/min — ABNORMAL LOW (ref >60)
Glucose, Bld: 136 mg/dL — ABNORMAL HIGH (ref 70–99)
Potassium: 3.9 mmol/L (ref 3.5–5.1)
Sodium: 134 mmol/L — ABNORMAL LOW (ref 135–145)

## 2020-12-07 MED ORDER — LORATADINE 10 MG PO TABS
10.0000 mg | ORAL_TABLET | Freq: Every day | ORAL | Status: DC
  Administered 2020-12-07 – 2020-12-19 (×13): 10 mg via ORAL
  Filled 2020-12-07 (×13): qty 1

## 2020-12-07 MED ORDER — IPRATROPIUM-ALBUTEROL 0.5-2.5 (3) MG/3ML IN SOLN
3.0000 mL | RESPIRATORY_TRACT | Status: DC | PRN
  Administered 2020-12-17 – 2020-12-19 (×4): 3 mL via RESPIRATORY_TRACT
  Filled 2020-12-07 (×4): qty 3

## 2020-12-07 MED ORDER — UMECLIDINIUM BROMIDE 62.5 MCG/INH IN AEPB
1.0000 | INHALATION_SPRAY | Freq: Every day | RESPIRATORY_TRACT | Status: DC
  Administered 2020-12-07 – 2020-12-19 (×13): 1 via RESPIRATORY_TRACT
  Filled 2020-12-07 (×3): qty 7

## 2020-12-07 MED ORDER — PIRFENIDONE 267 MG PO TABS
2.0000 | ORAL_TABLET | Freq: Three times a day (TID) | ORAL | Status: DC
  Administered 2020-12-08 – 2020-12-15 (×22): 534 mg via ORAL
  Filled 2020-12-07 (×23): qty 2

## 2020-12-07 MED ORDER — ROSUVASTATIN CALCIUM 10 MG PO TABS
40.0000 mg | ORAL_TABLET | Freq: Every day | ORAL | Status: DC
  Administered 2020-12-07 – 2020-12-19 (×13): 40 mg via ORAL
  Filled 2020-12-07 (×13): qty 4

## 2020-12-07 MED ORDER — MOMETASONE FURO-FORMOTEROL FUM 200-5 MCG/ACT IN AERO
2.0000 | INHALATION_SPRAY | Freq: Two times a day (BID) | RESPIRATORY_TRACT | Status: DC
  Administered 2020-12-07 – 2020-12-19 (×25): 2 via RESPIRATORY_TRACT
  Filled 2020-12-07: qty 8.8

## 2020-12-07 MED ORDER — IRBESARTAN 150 MG PO TABS
300.0000 mg | ORAL_TABLET | Freq: Every day | ORAL | Status: DC
  Administered 2020-12-07 – 2020-12-19 (×12): 300 mg via ORAL
  Filled 2020-12-07 (×13): qty 2

## 2020-12-07 NOTE — Progress Notes (Signed)
Inpatient Diabetes Program Recommendations  AACE/ADA: New Consensus Statement on Inpatient Glycemic Control   Target Ranges:  Prepandial:   less than 140 mg/dL      Peak postprandial:   less than 180 mg/dL (1-2 hours)      Critically ill patients:  140 - 180 mg/dL   Results for TERRICKA, ONOFRIO (MRN [id number]) as of 12/07/2020 09:31  Ref. Range 12/06/2020 08:12 12/06/2020 11:46 12/06/2020 16:31 12/06/2020 21:33 12/07/2020 09:17  Glucose-Capillary Latest Ref Range: 70 - 99 mg/dL 741 (H) 287 (H) 867 (H) 141 (H) 119 (H)    Review of Glycemic Control  Diabetes history: DM2 Outpatient Diabetes medications: Metformin 1000 mg QPM Current orders for Inpatient glycemic control: Novolog 0-20 units AC&HS; Prednisone 40 mg QAM  Inpatient Diabetes Program Recommendations:    Insulin: If steroids are continued, please consider ordering Novolog 3 units TID with meals for meal coverage if patient eats at least 50% of meals.  Thanks, Orlando Penner, RN, MSN, CDE Diabetes Coordinator Inpatient Diabetes Program [phone] (Team Pager from 8am to 5pm)

## 2020-12-07 NOTE — Progress Notes (Signed)
PROGRESS NOTE    Sierra Guzman  KWI:[id number] DOB: 1940/09/25 DOA: 12/04/2020 PCP: System, Provider Not In   Brief Narrative:  Sierra Guzman is a 80 y.o. 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  Patient admitted for acute on chronic hypoxic respiratory failure in setting of ILD flare with profound ambulatory dysfunction hypoxia and dyspnea with exertion.  Assessment & Plan:  Acute on chronic hypoxic respiratory failure likely due to exacerbation of interstitial lung disease/interstitial pulmonary fibrosis. -Continue nebs, steroids, supportive care, hold antibiotics at this time -Continue respiratory therapy, incentive spirometry, flutter -Early ambulation -Continue to wean oxygen back to home baseline of 2 L at rest and 5 L with exertion -We will likely need close follow-up in the outpatient setting with pulmonology -currently refusing SNF placement despite recommendations  Acute kidney injury superimposed on stage IIIb chronic kidney disease, resolving. -Transition to p.o., DC IV fluids  Essential hypertension. - We will place the patient on as needed IV labetalol  -Holding home HCTZ and ARB and ACE - unclear why patient is on both ARB and ACE inhibitor, will need to follow-up with PCP.  Type II diabetes mellitus, uncontrolled with hyperglycemia. - We will place the patient on supplement coverage with NovoLog and hold off metformin at this time. -A1c 8.1 -lengthy discussion about dietary compliance  Dyslipidemia. - Statin therapy and fish oil continued.  GERD. - PPI therapy will be  resumed.    DVT prophylaxis: Lovenox. Code Status: full code. Family Communication:  The plan of care was discussed in details with the patient and family at bedside  Status is: Inpatient  Dispo: The patient is from: Home              Anticipated d/c is to: To be determined, recommending SNF, patient requesting home health              Anticipated d/c date is: 24-48 hours              Patient currently not medically stable for discharge  Consultants:  None  Procedures:  None  Antimicrobials:  None  Subjective: No acute issues or events overnight denies nausea vomiting diarrhea constipation headache fevers chills or chest pain.  Dyspnea ongoing but markedly improved, not yet back to baseline  Objective: Vitals:   12/06/20 1115 12/06/20 1630 12/06/20 2001 12/07/20 0500  BP: (!) 149/70 131/78 137/74 138/72  Pulse: 68 72 68 72  Resp: (!) 22 (!) 25 (!) 22 (!) 22  Temp: 98.1 F (36.7 C) 98.6 F (37 C) 98.7 F (37.1 C) 98.6 F (37 C)  TempSrc: Oral Oral  Oral  SpO2: 95% 100% 100% 100%  Weight:        Intake/Output Summary (Last 24 hours) at 12/07/2020 0805 Last data filed at 12/07/2020 0500 Gross per 24 hour  Intake 240 ml  Output 1800 ml  Net -1560 ml    Filed Weights   12/05/20 0540  Weight: 70 kg    Examination:  General:  Pleasantly resting in bed, No acute distress. HEENT:  Normocephalic atraumatic.  Sclerae nonicteric, noninjected.  Extraocular movements intact bilaterally. Neck:  Without mass or  deformity.  Trachea is midline. Lungs: Diffuse bilateral rhonchi without overt wheeze or rales. Heart:  Regular rate and rhythm.  Without murmurs, rubs, or gallops. Abdomen:  Soft, nontender, nondistended.  Without guarding or rebound. Extremities: Without cyanosis, clubbing, edema, or obvious deformity. Vascular:  Dorsalis pedis and posterior tibial pulses palpable bilaterally. Skin:  Warm and dry, no erythema, no ulcerations.   Data Reviewed: I have  personally reviewed following labs and imaging studies  CBC: Recent Labs  Lab 12/04/20 1920 12/05/20 0623 12/06/20 0520 12/07/20 0515  WBC 10.3 8.9 10.9* 11.7*  HGB 8.4* 7.4* 7.3* 7.8*  HCT 27.0* 24.2* 23.6* 25.2*  MCV 87.4 86.4 87.4 87.2  PLT 329 280 309 335    Basic Metabolic Panel: Recent Labs  Lab 12/04/20 1920 12/05/20 0623 12/06/20 0520 12/07/20 0515  NA 135 138 132* 134*  K 4.5 4.3 4.4 3.9  CL 102 107 101 100  CO2 24 25 25 26   GLUCOSE 384* 221* 195* 136*  BUN 22 19 27* 28*  CREATININE 1.33* 1.19* 1.27* 1.15*  CALCIUM 9.1 8.9 8.8* 9.0    GFR: Estimated Creatinine Clearance: 37.7 mL/min (A) (by C-G formula based on SCr of 1.15 mg/dL (H)). Liver Function Tests: Recent Labs  Lab 12/04/20 1920  AST 24  ALT 13  ALKPHOS 55  BILITOT 0.3  PROT 6.8  ALBUMIN 2.9*    Recent Labs  Lab 12/04/20 2330  LIPASE 28    No results for input(s): AMMONIA in the last 168 hours. Coagulation Profile: Recent Labs  Lab 12/04/20 2330  INR 1.1    Cardiac Enzymes: No results for input(s): CKTOTAL, CKMB, CKMBINDEX, TROPONINI in the last 168 hours. BNP (last 3 results) No results for input(s): PROBNP in the last 8760 hours. HbA1C: Recent Labs    12/05/20 0623  HGBA1C 8.1*    CBG: Recent Labs  Lab 12/05/20 2018 12/06/20 0812 12/06/20 1146 12/06/20 1631 12/06/20 2133  GLUCAP 222* 180* 230* 259* 141*    Lipid Profile: No results for input(s): CHOL, HDL, LDLCALC, TRIG, CHOLHDL, LDLDIRECT in the last 72 hours. Thyroid Function Tests: No results for input(s): TSH, T4TOTAL, FREET4, T3FREE, THYROIDAB in the last 72 hours. Anemia Panel: No results for input(s): VITAMINB12, FOLATE, FERRITIN, TIBC, IRON, RETICCTPCT in the last 72 hours. Sepsis Labs: Recent Labs  Lab 12/04/20 2330  LATICACIDVEN 1.5     Recent Results (from the past 240 hour(s))  Blood culture (routine single)     Status: None (Preliminary result)   Collection Time: 12/04/20 11:30 PM    Specimen: BLOOD  Result Value Ref Range Status   Specimen Description BLOOD BLOOD LEFT HAND  Final   Special Requests   Final    BOTTLES DRAWN AEROBIC AND ANAEROBIC Blood Culture adequate volume   Culture   Final    NO GROWTH 2 DAYS Performed at [facility], [address] [place], [place]    Report Status PENDING  Incomplete  Urine Culture     Status: None   Collection Time: 12/04/20 11:30 PM   Specimen: In/Out Cath Urine  Result Value Ref Range Status   Specimen Description   Final    IN/OUT CATH URINE Performed at [facility], [address] [place], [place]    Special Requests   Final    NONE Performed at [facility], [address], [place], [place]    Culture   Final    NO GROWTH Performed at [facility], [address], [place], [place] [address]    Report Status 12/06/2020 FINAL  Final  Resp Panel by RT-PCR (Flu A&B, Covid) Nasopharyngeal Swab     Status: None   Collection Time: 12/04/20 11:30 PM   Specimen: Nasopharyngeal Swab; Nasopharyngeal(NP) swabs in vial transport medium  Result Value Ref Range Status   SARS Coronavirus 2 by RT PCR NEGATIVE NEGATIVE Final    Comment: (NOTE) SARS-CoV-2 target nucleic acids are NOT DETECTED.  The SARS-CoV-2 RNA is generally detectable in upper respiratory specimens during the acute phase of infection. The lowest concentration of SARS-CoV-2 viral copies this assay can detect is 138 copies/mL. A negative result does not preclude SARS-Cov-2 infection and should not be used as the sole basis for treatment or other patient management decisions. A negative result may occur with  improper specimen collection/handling, submission of specimen other than nasopharyngeal swab, presence of viral mutation(s) within the areas targeted by this assay, and inadequate number of viral copies(<138 copies/mL). A negative result must be combined with clinical observations,  patient history, and epidemiological information. The expected result is Negative.  Fact Sheet for Patients:  BloggerCourse.com  Fact Sheet for Healthcare Providers:  SeriousBroker.it  This test is no t yet approved or cleared by the [place] FDA and  has been authorized for detection and/or diagnosis of SARS-CoV-2 by FDA under an Emergency Use Authorization (EUA). This EUA will remain  in effect (meaning this test can be used) for the duration of the COVID-19 declaration under Section 564(b)(1) of the Act, 21 U.S.C.section 360bbb-3(b)(1), unless the authorization is terminated  or revoked sooner.       Influenza A by PCR NEGATIVE NEGATIVE Final   Influenza B by PCR NEGATIVE NEGATIVE Final    Comment: (NOTE) The Xpert Xpress SARS-CoV-2/FLU/RSV plus assay is intended as an aid in the diagnosis of influenza from Nasopharyngeal swab specimens and should not be used as a sole basis for treatment. Nasal washings and aspirates are unacceptable for Xpert Xpress SARS-CoV-2/FLU/RSV testing.  Fact Sheet for Patients: BloggerCourse.com  Fact Sheet for Healthcare Providers: SeriousBroker.it  This test is not yet approved or cleared by the [place] FDA and has been authorized for detection and/or diagnosis of SARS-CoV-2 by FDA under an Emergency Use Authorization (EUA). This EUA will remain in effect (meaning this test can be used) for the duration of the COVID-19 declaration under Section 564(b)(1) of the Act, 21 U.S.C. section 360bbb-3(b)(1), unless the authorization is terminated or revoked.  Performed at [facility], [address] [place], [place] [address]      Radiology Studies: No results found.   Scheduled Meds:  enoxaparin (LOVENOX) injection  40 mg Subcutaneous Q24H   insulin aspart  0-20 Units Subcutaneous TID AC & HS   predniSONE  40 mg Oral Q  breakfast     LOS: 2 days   Time spent:  Azucena Fallen, DO [facility]  If 7PM-7AM, please contact night-coverage [email or web address]  12/07/2020, 8:05 AM

## 2020-12-07 NOTE — Progress Notes (Signed)
Mobility Specialist - Progress Note   12/07/20 1600  Mobility  Activity Transferred:  Bed to chair  Level of Assistance Standby assist, set-up cues, supervision of patient - no hands on  Assistive Device None  Distance Ambulated (ft) 3 ft  Mobility Out of bed to chair with meals  Mobility Response Tolerated well  Mobility performed by Mobility specialist  $Mobility charge 1 Mobility    Pt transferred bed-chair. O2 desat to 78% on 3L, rebounded to 93% prior to exit.    Filiberto Pinks Mobility Specialist 12/07/20, 4:49 PM

## 2020-12-07 NOTE — Progress Notes (Signed)
Mobility Specialist - Progress Note   12/07/20 1500  Mobility  Activity Ambulated in room;Transferred:  Bed to chair  Level of Assistance Standby assist, set-up cues, supervision of patient - no hands on  Assistive Device Front wheel walker  Distance Ambulated (ft) 30 ft  Mobility Ambulated with assistance in room  Mobility Response Tolerated well  Mobility performed by Mobility specialist  $Mobility charge 1 Mobility    Pre-mobility: 98% SpO2 During mobility: 119 HR, 89% SpO2 Post-mobility: 95% SpO2   Pt ambulated in room with RW. PLB educated and engaged throughout. Energy conservation method used. O2 maintained > 88% on 4L most of session, but did desat to a low of 81% once returned to EOB. Pt anxious but controlled with calming technique. Coughing up phlegm. Pt returned to 3L prior to exit. NT at bedside.    Filiberto Pinks Mobility Specialist 12/07/20, 4:44 PM

## 2020-12-07 NOTE — TOC Initial Note (Signed)
Transition of Care [facility]) - Initial/Assessment Note    Patient Details  Name: Sierra Guzman MRN: [id number] Date of Birth: 1940/06/29  Transition of Care [facility]) CM/SW Contact:    Chapman Fitch, RN Phone Number: 12/07/2020, 1:27 PM  Clinical Narrative:                 Admitted for: respiratory failre Admitted from:Home alone PCP: Katrinka Blazing - patient states at baseline she drives herself [facility] - denies issues obtaining [facility] Current home health/prior home health/DME:[employer], o2  PT recommending SNF.  Patient declines SNF.  State she is concerned about covid in facilities, and does not feel like it would  be good for her "mental state".  Patient agreeable to home health services. States she does not have a preferance of home health agency.  Referral made to [place] with [facility] health  Patient states that she has O2 through [employer], portable concentrator and home concentrator that goes up to 5L.  If patient requires higher then 5, home concentrator will need to be switched out    Expected Discharge Plan: Home w Home Health Services Barriers to Discharge: Continued Medical Work up   Patient Goals and CMS Choice Patient states their goals for this hospitalization and ongoing recovery are:: To figure out what is going on with me before I got home      Expected Discharge Plan and Services Expected Discharge Plan: Home w Home Health Services   Discharge Planning Services: CM Consult   Living arrangements for the past 2 months: Single Family Home                           HH Arranged: RN, PT, OT [facility] Agency: [facility] Date [facility] Agency Contacted: 12/07/20   Representative spoke with at [facility] Agency: [place]  Prior Living Arrangements/Services Living arrangements for the past 2 months: Single Family Home Lives with:: Self Patient language and need for interpreter reviewed:: Yes Do you feel safe going back to the place where you live?: Yes      Need for Family  Participation in Patient Care: Yes (Comment) Care giver support system in place?: Yes (comment) Current home services: DME Criminal Activity/Legal Involvement Pertinent to Current Situation/Hospitalization: No - Comment as needed  Activities of Daily Living Home Assistive Devices/Equipment: Bathtub lift, Walker (specify type) (rolling walker with seat) ADL Screening (condition at time of admission) Patient's cognitive ability adequate to safely complete daily activities?: Yes Is the patient deaf or have difficulty hearing?: No Does the patient have difficulty seeing, even when wearing glasses/contacts?: No Does the patient have difficulty concentrating, remembering, or making decisions?: No Patient able to express need for assistance with ADLs?: No Does the patient have difficulty dressing or bathing?: Yes Independently performs ADLs?: Yes (appropriate for developmental age) Does the patient have difficulty walking or climbing stairs?: No Weakness of Legs: Both Weakness of Arms/Hands: None  Permission Sought/Granted                  Emotional Assessment       Orientation: : Oriented to Self, Oriented to Situation, Oriented to Place, Oriented to  Time Alcohol / Substance Use: Not Applicable Psych Involvement: No (comment)  Admission diagnosis:  Exertional dyspnea [R06.00] Acute respiratory failure with hypoxia (HCC) [J96.01] Acute on chronic respiratory failure with hypoxemia (HCC) [J96.21] Patient Active Problem List   Diagnosis Date Noted   Acute on chronic respiratory failure with hypoxemia (  HCC) 12/05/2020   Acute respiratory failure with hypoxia (HCC) 12/05/2020   Hip pain 08/06/2020   Spinal stenosis in cervical region 08/06/2020   Spondylolisthesis, congenital 08/06/2020   Status post total hip replacement, 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   Primary localized osteoarthritis of right hip 12/12/2016   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   Stage 3b chronic  kidney disease (HCC) 12/02/2013   B12 deficiency 10/28/2010   Coronary artery disease 10/28/2010   Diabetes type 2, controlled (HCC) 10/28/2010   Gout 10/28/2010   Hyperlipidemia with target LDL less than 100 10/28/2010   Hypertension 10/28/2010   Osteoarthritis of left knee 10/28/2010   PCP:  System, Provider Not In Pharmacy:   [id number] [place] - [address] [place] [address] Phone: [phone] Fax: [phone]     Social Determinants of Health (SDOH) Interventions    Readmission Risk Interventions No flowsheet data found.

## 2020-12-07 NOTE — Progress Notes (Signed)
Chaplain Maggie attempted to visit pt to introduce pastoral care, but pt was on the telephone. Spiritual care available per on call chaplain.

## 2020-12-08 LAB — BASIC METABOLIC PANEL
Anion gap: 6 (ref 5–15)
BUN: 25 mg/dL — ABNORMAL HIGH (ref 8–23)
CO2: 28 mmol/L (ref 22–32)
Calcium: 8.7 mg/dL — ABNORMAL LOW (ref 8.9–10.3)
Chloride: 101 mmol/L (ref 98–111)
Creatinine, Ser: 1.05 mg/dL — ABNORMAL HIGH (ref 0.44–1.00)
GFR, Estimated: 53 mL/min — ABNORMAL LOW (ref >60)
Glucose, Bld: 180 mg/dL — ABNORMAL HIGH (ref 70–99)
Potassium: 4 mmol/L (ref 3.5–5.1)
Sodium: 135 mmol/L (ref 135–145)

## 2020-12-08 LAB — IRON AND TIBC
Iron: 26 ug/dL — ABNORMAL LOW (ref 28–170)
Saturation Ratios: 8 % — ABNORMAL LOW (ref 10.4–31.8)
TIBC: 347 ug/dL (ref 250–450)
UIBC: 321 ug/dL

## 2020-12-08 LAB — VITAMIN B12: Vitamin B-12: 1399 pg/mL — ABNORMAL HIGH (ref 180–914)

## 2020-12-08 LAB — GLUCOSE, CAPILLARY
Glucose-Capillary: 142 mg/dL — ABNORMAL HIGH (ref 70–99)
Glucose-Capillary: 256 mg/dL — ABNORMAL HIGH (ref 70–99)
Glucose-Capillary: 258 mg/dL — ABNORMAL HIGH (ref 70–99)
Glucose-Capillary: 308 mg/dL — ABNORMAL HIGH (ref 70–99)

## 2020-12-08 LAB — CBC
HCT: 25 % — ABNORMAL LOW (ref 36.0–46.0)
Hemoglobin: 7.6 g/dL — ABNORMAL LOW (ref 12.0–15.0)
MCH: 25.9 pg — ABNORMAL LOW (ref 26.0–34.0)
MCHC: 30.4 g/dL (ref 30.0–36.0)
MCV: 85 fL (ref 80.0–100.0)
Platelets: 331 10*3/uL (ref 150–400)
RBC: 2.94 MIL/uL — ABNORMAL LOW (ref 3.87–5.11)
RDW: 16.7 % — ABNORMAL HIGH (ref 11.5–15.5)
WBC: 11.4 10*3/uL — ABNORMAL HIGH (ref 4.0–10.5)
nRBC: 0 % (ref 0.0–0.2)

## 2020-12-08 LAB — OCCULT BLOOD X 1 CARD TO LAB, STOOL: Fecal Occult Bld: NEGATIVE

## 2020-12-08 LAB — VITAMIN D 25 HYDROXY (VIT D DEFICIENCY, FRACTURES): Vit D, 25-Hydroxy: 28.15 ng/mL — ABNORMAL LOW (ref 30–100)

## 2020-12-08 LAB — FOLATE: Folate: 14 ng/mL (ref >5.9)

## 2020-12-08 MED ORDER — SODIUM CHLORIDE 0.9 % IV SOLN
300.0000 mg | INTRAVENOUS | Status: AC
  Administered 2020-12-08 – 2020-12-10 (×3): 300 mg via INTRAVENOUS
  Filled 2020-12-08 (×3): qty 15

## 2020-12-08 MED ORDER — GABAPENTIN 300 MG PO CAPS
300.0000 mg | ORAL_CAPSULE | Freq: Three times a day (TID) | ORAL | Status: DC | PRN
  Administered 2020-12-08 – 2020-12-19 (×13): 300 mg via ORAL
  Filled 2020-12-08 (×13): qty 1

## 2020-12-08 MED ORDER — VITAMIN D (ERGOCALCIFEROL) 1.25 MG (50000 UNIT) PO CAPS
50000.0000 [IU] | ORAL_CAPSULE | ORAL | Status: DC
  Administered 2020-12-08 – 2020-12-15 (×2): 50000 [IU] via ORAL
  Filled 2020-12-08 (×2): qty 1

## 2020-12-08 NOTE — TOC Progression Note (Signed)
Transition of Care [facility]) - Progression Note    Patient Details  Name: Sierra Guzman MRN: [id number] Date of Birth: Sep 18, 1940  Transition of Care [facility]) CM/SW Contact  Chapman Fitch, RN Phone Number: 12/08/2020, 10:42 AM  Clinical Narrative:      Followed up with patient.  She has changed her mind and would like to pursue SNF Existing PASRR Fl2 sent for signature Bed search initiated   Patient requests that her daughter [name] be the point of contact.  Call [facility] and updated, she is in agreement of plan Expected Discharge Plan: Home w Home Health Services Barriers to Discharge: Continued Medical Work up  Expected Discharge Plan and Services Expected Discharge Plan: Home w Home Health Services   Discharge Planning Services: CM Consult   Living arrangements for the past 2 months: Single Family Home                           HH Arranged: RN, PT, OT [facility] Agency: [facility] Date [facility] Agency Contacted: 12/07/20   Representative spoke with at [facility] Agency: [place]   Social Determinants of Health (SDOH) Interventions    Readmission Risk Interventions No flowsheet data found.

## 2020-12-08 NOTE — Progress Notes (Signed)
Mobility Specialist - Progress Note   12/08/20 0900  Mobility  Activity Transferred:  Bed to chair  Level of Assistance Standby assist, set-up cues, supervision of patient - no hands on  Assistive Device None  Distance Ambulated (ft) 3 ft  Mobility Out of bed to chair with meals  Mobility Response Tolerated well  Mobility performed by Mobility specialist  $Mobility charge 1 Mobility    Pre-mobility: 94% SpO2 During mobility: 82% SpO2 Post-mobility: 94% SpO2   Pt SPT bed-chair. Utilizing 2L, O2 desat to low 80s once seated. Alarm set.   Filiberto Pinks Mobility Specialist 12/08/20, 9:08 AM

## 2020-12-08 NOTE — TOC Progression Note (Signed)
Transition of Care [facility]) - Progression Note    Patient Details  Name: Sierra Guzman MRN: [id number] Date of Birth: Aug 27, 1940  Transition of Care [facility]) CM/SW Contact  Chapman Fitch, RN Phone Number: 12/08/2020, 2:35 PM  Clinical Narrative:    Bed offers presented to daughter [name].  She is to discuss to family tonight, and Ill follow up tomorrow morning to get their decision    Expected Discharge Plan: Home w Home Health Services Barriers to Discharge: Continued Medical Work up  Expected Discharge Plan and Services Expected Discharge Plan: Home w Home Health Services   Discharge Planning Services: CM Consult   Living arrangements for the past 2 months: Single Family Home                           HH Arranged: RN, PT, OT [facility] Agency: [facility] Date [facility] Agency Contacted: 12/07/20   Representative spoke with at [facility] Agency: [place]   Social Determinants of Health (SDOH) Interventions    Readmission Risk Interventions No flowsheet data found.

## 2020-12-08 NOTE — Progress Notes (Signed)
Mobility Specialist - Progress Note   12/08/20 1556  Mobility  Activity Ambulated in room  Level of Assistance Standby assist, set-up cues, supervision of patient - no hands on  Assistive Device Front wheel walker  Distance Ambulated (ft) 30 ft  Mobility Ambulated with assistance in room  Mobility Response Tolerated well  Mobility performed by Mobility specialist  $Mobility charge 1 Mobility    Pre-mobility: 99% SpO2 During mobility: 81-84% SpO2 Post-mobility: 93% SpO2   Pt in recliner utilizing 2L, increased to 4L for OOB activity. O2 mid 80s most of activity, but does desat to a low of 81% once seated. Participated in breathing exercises. Returned to 2L prior to exit, alarm set, needs in reach.    Filiberto Pinks Mobility Specialist 12/08/20, 3:59 PM

## 2020-12-08 NOTE — Progress Notes (Signed)
A wonderful visit with this gentle woman. Her niece is a Personnel officer. Provided presence and support to this patient.

## 2020-12-08 NOTE — NC FL2 (Signed)
[place] MEDICAID FL2 LEVEL OF CARE SCREENING TOOL     IDENTIFICATION  Patient Name: Sierra Guzman Birthdate: [birth year] Sex: female Admission Date (Current Location): 12/04/2020  [facility] and [place] Number:  Chiropodist and Address:         Provider Number: [id number]  Attending Physician Name and Address:  Gillis Santa, MD  Relative Name and Phone Number:       Current Level of Care: Hospital Recommended Level of Care: Skilled Nursing Facility Prior Approval Number:    Date Approved/Denied:   PASRR Number: [id number]  Discharge Plan: SNF    Current Diagnoses: Patient Active Problem List   Diagnosis Date Noted   Acute on chronic respiratory failure with hypoxemia (HCC) 12/05/2020   Acute respiratory failure with hypoxia (HCC) 12/05/2020   Hip pain 08/06/2020   Spinal stenosis in cervical region 08/06/2020   Spondylolisthesis, congenital 08/06/2020   Status post total hip replacement, 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   Primary localized osteoarthritis of right hip 12/12/2016   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   B12 deficiency 10/28/2010   Coronary artery disease 10/28/2010   Diabetes type 2, controlled (HCC) 10/28/2010   Gout 10/28/2010   Hyperlipidemia with target LDL less than 100 10/28/2010   Hypertension 10/28/2010   Osteoarthritis of left knee 10/28/2010    Orientation RESPIRATION BLADDER Height & Weight     Self, Time, Situation, Place  O2 (2-5 L [place]) Incontinent Weight: 70 kg Height:  5\' 5"  (165.1 cm)  BEHAVIORAL SYMPTOMS/MOOD NEUROLOGICAL BOWEL NUTRITION STATUS      Continent Diet (Carb modified)  AMBULATORY STATUS COMMUNICATION OF NEEDS Skin   Limited Assist Verbally Normal                       Personal Care Assistance Level of Assistance              Functional Limitations Info             SPECIAL CARE FACTORS FREQUENCY  PT (By licensed PT), OT (By  licensed OT)                    Contractures Contractures Info: Not present    Additional Factors Info  Code Status, Allergies Code Status Info: Full Allergies Info: Morphine and related           Current Medications (12/08/2020):  This is the current hospital active medication list Current Facility-Administered Medications  Medication Dose Route Frequency Provider Last Rate Last Admin   acetaminophen (TYLENOL) tablet 650 mg  650 mg Oral Q6H PRN Mansy, Jan A, MD       Or   acetaminophen (TYLENOL) suppository 650 mg  650 mg Rectal Q6H PRN Mansy, Jan A, MD       enoxaparin (LOVENOX) injection 40 mg  40 mg Subcutaneous Q24H Mansy, Jan A, MD   40 mg at 12/08/20 0946   gabapentin (NEURONTIN) capsule 300 mg  300 mg Oral TID PRN 12/10/20, MD       guaiFENesin-dextromethorphan (ROBITUSSIN DM) 100-10 MG/5ML syrup 5 mL  5 mL Oral Q4H PRN Gillis Santa, MD   5 mL at 12/06/20 1649   insulin aspart (novoLOG) injection 0-20 Units  0-20 Units Subcutaneous TID [facility] Mansy, Jan A, MD   3 Units at 12/08/20 0945   ipratropium-albuterol (DUONEB) 0.5-2.5 (3) MG/3ML nebulizer solution 3 mL  3 mL Nebulization  Q2H PRN Azucena Fallen, MD       irbesartan Evlyn Kanner) tablet 300 mg  300 mg Oral Daily Azucena Fallen, MD   300 mg at 12/07/20 1452   loratadine (CLARITIN) tablet 10 mg  10 mg Oral Daily Azucena Fallen, MD   10 mg at 12/08/20 0945   magnesium hydroxide (MILK OF MAGNESIA) suspension 30 mL  30 mL Oral Daily PRN Mansy, Jan A, MD   30 mL at 12/07/20 2017   mometasone-formoterol (DULERA) 200-5 MCG/ACT inhaler 2 puff  2 puff Inhalation BID Azucena Fallen, MD   2 puff at 12/08/20 0946   ondansetron [facility]) tablet 4 mg  4 mg Oral Q6H PRN Mansy, Jan A, MD       Or   ondansetron [facility]) injection 4 mg  4 mg Intravenous Q6H PRN Mansy, Jan A, MD       Pirfenidone TABS 534 mg  2 tablet Oral TID Azucena Fallen, MD   534 mg at 12/08/20 0945   predniSONE (DELTASONE)  tablet 40 mg  40 mg Oral Q breakfast Mansy, Jan A, MD   40 mg at 12/08/20 0945   rosuvastatin (CRESTOR) tablet 40 mg  40 mg Oral Daily Azucena Fallen, MD   40 mg at 12/08/20 0944   traZODone (DESYREL) tablet 25 mg  25 mg Oral QHS PRN Mansy, Jan A, MD       umeclidinium bromide (INCRUSE ELLIPTA) 62.5 MCG/INH 1 puff  1 puff Inhalation Daily Azucena Fallen, MD   1 puff at 12/08/20 4268     Discharge Medications: Please see discharge summary for a list of discharge medications.  Relevant Imaging Results:  Relevant Lab Results:   Additional Information ss [id number]  Chapman Fitch, RN

## 2020-12-08 NOTE — Care Management Important Message (Signed)
Important Message  Patient Details  Name: Sierra Guzman MRN: [id number] Date of Birth: Dec 26, 1940   Medicare Important Message Given:  Yes     [name] 12/08/2020, 10:59 AM

## 2020-12-08 NOTE — Progress Notes (Signed)
Inpatient Diabetes Program Recommendations  AACE/ADA: New Consensus Statement on Inpatient Glycemic Control   Target Ranges:  Prepandial:   less than 140 mg/dL      Peak postprandial:   less than 180 mg/dL (1-2 hours)      Critically ill patients:  140 - 180 mg/dL   Results for Sierra Guzman, Sierra Guzman (MRN [id number]) as of 12/08/2020 09:38  Ref. Range 12/07/2020 09:17 12/07/2020 16:27 12/07/2020 22:59 12/08/2020 07:58  Glucose-Capillary Latest Ref Range: 70 - 99 mg/dL 941 (H) 740 (H)  Novolog 15 units 84 142 (H)   Review of Glycemic Control  Diabetes history: DM2 Outpatient Diabetes medications: Metformin 1000 mg QPM Current orders for Inpatient glycemic control: Novolog 0-20 units AC&HS; Prednisone 40 mg QAM   Inpatient Diabetes Program Recommendations:     Insulin: If steroids are continued, please consider ordering Novolog 3 units TID with meals for meal coverage if patient eats at least 50% of meals.   Thanks Orlando Penner, RN, MSN, CDE Diabetes Coordinator Inpatient Diabetes Program [phone] (Team Pager from 8am to 5pm)

## 2020-12-08 NOTE — Progress Notes (Signed)
PROGRESS NOTE    Sierra Guzman  YWV:[id number] DOB: Nov 09, 1940 DOA: 12/04/2020 PCP: System, Provider Not In   Brief Narrative:  Sierra Guzman is a 80 y.o. 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  Patient admitted for acute on chronic hypoxic respiratory failure in setting of ILD flare with profound ambulatory dysfunction hypoxia and dyspnea with exertion.  Assessment & Plan:  Acute on chronic hypoxic respiratory failure likely due to exacerbation of interstitial lung disease/interstitial pulmonary fibrosis. -Continue nebs, steroids, supportive care, hold antibiotics at this time -Continue respiratory therapy, incentive spirometry, flutter -Early ambulation -Continue to wean oxygen back to home baseline of 2 L at rest and 5 L with exertion -We will likely need close follow-up in the outpatient setting with pulmonology  -Agreed for SNF placement Follow 2D echocardiogram for persistent dyspnea on exertion, to assess LVEF   Anemia of chronic disease,and iron deficiency.  Hemoglobin 7.6, Denied any GI bleeding.  FOBT negative Patient several years ago in the past Iron deficiency, started IV Venofer 300 milligrams daily for 3 days Folic acid within normal range, B12 elevated, patient does not need B12 supplement at this time   Acute kidney injury superimposed on stage IIIb chronic kidney disease, resolving. -Transition to p.o., DC IV fluids  Essential hypertension. - We will place the patient on as needed IV labetalol  -Holding home HCTZ and ARB and ACE - unclear why patient is on both ARB and ACE  inhibitor, will need to follow-up with PCP.  Type II diabetes mellitus, uncontrolled with hyperglycemia. - We will place the patient on supplement coverage with NovoLog and hold off metformin at this time. -A1c 8.1 -lengthy discussion about dietary compliance  Dyslipidemia. - Statin therapy and fish oil continued.  GERD. - PPI therapy will be resumed.    DVT prophylaxis: Lovenox. Code Status: full code. Family Communication:  The plan of care was discussed in details with the patient and family at bedside  Status is: Inpatient  Dispo: The patient is from: Home              Anticipated d/c is to: SNF              Anticipated d/c date is: when bed is available               Patient currently stable for discharge  Consultants:  None  Procedures:  None  Antimicrobials:  None  Subjective: No significant overnight events, patient is still complaining of dyspnea on exertion, feels some improvement.  Denied any chest pain or palpitations, no any abdominal pain.   Objective: Vitals:   12/07/20 2050 12/08/20 0756 12/08/20 0900 12/08/20 1511  BP: 132/78 (!) 124/57  120/61  Pulse: 98 73  (!) 103  Resp: 18 20  19   Temp: 98.6 F (37 C) 98.2 F (36.8 C)  98.2 F (36.8 C)  TempSrc: Oral Oral  Oral  SpO2: 98% 97%  98%  Weight:      Height:   5\' 5"  (1.651 m)     Intake/Output Summary (Last 24 hours) at 12/08/2020 1752 Last data filed at 12/08/2020 1418 Gross per 24  hour  Intake 600 ml  Output 1200 ml  Net -600 ml   Filed Weights   12/05/20 0540  Weight: 70 kg    Examination:  General:  Pleasantly resting in bed, No acute distress. HEENT:  Normocephalic atraumatic.  Sclerae nonicteric, noninjected.  Extraocular movements intact bilaterally. Neck:  Without mass or deformity.  Trachea is midline. Lungs: Diffuse bilateral rhonchi without overt wheeze or rales. Heart:  Regular rate and rhythm.  Without murmurs, rubs, or gallops. Abdomen:  Soft, nontender, nondistended.   Without guarding or rebound. Extremities: Without cyanosis, clubbing, edema, or obvious deformity. Vascular:  Dorsalis pedis and posterior tibial pulses palpable bilaterally. Skin:  Warm and dry, no erythema, no ulcerations.   Data Reviewed: I have personally reviewed following labs and imaging studies  CBC: Recent Labs  Lab 12/04/20 1920 12/05/20 0623 12/06/20 0520 12/07/20 0515 12/08/20 0537  WBC 10.3 8.9 10.9* 11.7* 11.4*  HGB 8.4* 7.4* 7.3* 7.8* 7.6*  HCT 27.0* 24.2* 23.6* 25.2* 25.0*  MCV 87.4 86.4 87.4 87.2 85.0  PLT 329 280 309 335 331   Basic Metabolic Panel: Recent Labs  Lab 12/04/20 1920 12/05/20 0623 12/06/20 0520 12/07/20 0515 12/08/20 0537  NA 135 138 132* 134* 135  K 4.5 4.3 4.4 3.9 4.0  CL 102 107 101 100 101  CO2 24 25 25 26 28   GLUCOSE 384* 221* 195* 136* 180*  BUN 22 19 27* 28* 25*  CREATININE 1.33* 1.19* 1.27* 1.15* 1.05*  CALCIUM 9.1 8.9 8.8* 9.0 8.7*   GFR: Estimated Creatinine Clearance: 41.3 mL/min (A) (by C-G formula based on SCr of 1.05 mg/dL (H)). Liver Function Tests: Recent Labs  Lab 12/04/20 1920  AST 24  ALT 13  ALKPHOS 55  BILITOT 0.3  PROT 6.8  ALBUMIN 2.9*   Recent Labs  Lab 12/04/20 2330  LIPASE 28   No results for input(s): AMMONIA in the last 168 hours. Coagulation Profile: Recent Labs  Lab 12/04/20 2330  INR 1.1   Cardiac Enzymes: No results for input(s): CKTOTAL, CKMB, CKMBINDEX, TROPONINI in the last 168 hours. BNP (last 3 results) No results for input(s): PROBNP in the last 8760 hours. HbA1C: No results for input(s): HGBA1C in the last 72 hours. CBG: Recent Labs  Lab 12/07/20 1627 12/07/20 2259 12/08/20 0758 12/08/20 1136 12/08/20 1647  GLUCAP 343* 84 142* 256* 258*   Lipid Profile: No results for input(s): CHOL, HDL, LDLCALC, TRIG, CHOLHDL, LDLDIRECT in the last 72 hours. Thyroid Function Tests: No results for input(s): TSH, T4TOTAL, FREET4, T3FREE, THYROIDAB in the last 72 hours. Anemia  Panel: Recent Labs    12/08/20 1035  VITAMINB12 1,399*  FOLATE 14.0  TIBC 347  IRON 26*   Sepsis Labs: Recent Labs  Lab 12/04/20 2330  LATICACIDVEN 1.5    Recent Results (from the past 240 hour(s))  Blood culture (routine single)     Status: None (Preliminary result)   Collection Time: 12/04/20 11:30 PM   Specimen: BLOOD  Result Value Ref Range Status   Specimen Description BLOOD BLOOD LEFT HAND  Final   Special Requests   Final    BOTTLES DRAWN AEROBIC AND ANAEROBIC Blood Culture adequate volume   Culture   Final    NO GROWTH 3 DAYS Performed at [facility], [address], [place], [place]    Report Status PENDING  Incomplete  Urine Culture     Status: None   Collection Time: 12/04/20 11:30 PM   Specimen: In/Out Cath Urine  Result Value Ref Range Status   Specimen Description   Final    IN/OUT CATH URINE Performed at [facility], [address] [place], [place] [address]    Special Requests   Final    NONE Performed at [facility], [address], [place], [place] [address]    Culture   Final    NO GROWTH Performed at [facility], [address] [place], [place] [address]    Report Status 12/06/2020 FINAL  Final  Resp Panel by RT-PCR (Flu A&B, Covid) Nasopharyngeal Swab     Status: None   Collection Time: 12/04/20 11:30 PM   Specimen: Nasopharyngeal Swab; Nasopharyngeal(NP) swabs in vial transport medium  Result Value Ref Range Status   SARS Coronavirus 2 by RT PCR NEGATIVE NEGATIVE Final    Comment: (NOTE) SARS-CoV-2 target nucleic acids are NOT DETECTED.  The SARS-CoV-2 RNA is generally detectable in upper respiratory specimens during the acute phase of infection. The lowest concentration of SARS-CoV-2 viral copies this assay can detect is 138 copies/mL. A negative result does not preclude SARS-Cov-2 infection and should not be used as the sole basis for treatment or other patient management decisions.  A negative result may occur with  improper specimen collection/handling, submission of specimen other than nasopharyngeal swab, presence of viral mutation(s) within the areas targeted by this assay, and inadequate number of viral copies(<138 copies/mL). A negative result must be combined with clinical observations, patient history, and epidemiological information. The expected result is Negative.  Fact Sheet for Patients:  BloggerCourse.com  Fact Sheet for Healthcare Providers:  SeriousBroker.it  This test is no t yet approved or cleared by the [place] FDA and  has been authorized for detection and/or diagnosis of SARS-CoV-2 by FDA under an Emergency Use Authorization (EUA). This EUA will remain  in effect (meaning this test can be used) for the duration of the COVID-19 declaration under Section 564(b)(1) of the Act, 21 U.S.C.section 360bbb-3(b)(1), unless the authorization is terminated  or revoked sooner.       Influenza A by PCR NEGATIVE NEGATIVE Final   Influenza B by PCR NEGATIVE NEGATIVE Final    Comment: (NOTE) The Xpert Xpress SARS-CoV-2/FLU/RSV plus assay is intended as an aid in the diagnosis of influenza from Nasopharyngeal swab specimens and should not be used as a sole basis for treatment. Nasal washings and aspirates are unacceptable for Xpert Xpress SARS-CoV-2/FLU/RSV testing.  Fact Sheet for Patients: BloggerCourse.com  Fact Sheet for Healthcare Providers: SeriousBroker.it  This test is not yet approved or cleared by the [place] FDA and has been authorized for detection and/or diagnosis of SARS-CoV-2 by FDA under an Emergency Use Authorization (EUA). This EUA will remain in effect (meaning this test can be used) for the duration of the COVID-19 declaration under Section 564(b)(1) of the Act, 21 U.S.C. section 360bbb-3(b)(1), unless the authorization  is terminated or revoked.  Performed at [facility], [address] [place], [place] [address]      Radiology Studies: No results found.   Scheduled Meds:  enoxaparin (LOVENOX) injection  40 mg Subcutaneous Q24H   insulin aspart  0-20 Units Subcutaneous TID AC & HS   irbesartan  300 mg Oral Daily   loratadine  10 mg Oral Daily   mometasone-formoterol  2 puff Inhalation BID   Pirfenidone  2 tablet Oral TID   predniSONE  40 mg Oral Q breakfast   rosuvastatin  40 mg Oral Daily   umeclidinium bromide  1 puff Inhalation Daily     LOS: 3 days   Time spent: 40min  Gillis Santaileep Donatello Kleve, DO [facility]  If 7PM-7AM, please contact night-coverage [email or web address]  12/08/2020, 5:52 PM

## 2020-12-09 ENCOUNTER — Inpatient Hospital Stay (HOSPITAL_COMMUNITY)
Admit: 2020-12-09 | Discharge: 2020-12-09 | Disposition: A | Discharge status: Home / Self Care | Payer: Medicare HMO | Attending: Student | Admitting: Student

## 2020-12-09 DIAGNOSIS — R0609 Other forms of dyspnea: Secondary | ICD-10-CM

## 2020-12-09 LAB — GLUCOSE, CAPILLARY
Glucose-Capillary: 124 mg/dL — ABNORMAL HIGH (ref 70–99)
Glucose-Capillary: 224 mg/dL — ABNORMAL HIGH (ref 70–99)
Glucose-Capillary: 230 mg/dL — ABNORMAL HIGH (ref 70–99)
Glucose-Capillary: 174 mg/dL — ABNORMAL HIGH (ref 70–99)

## 2020-12-09 LAB — BASIC METABOLIC PANEL
Anion gap: 4 — ABNORMAL LOW (ref 5–15)
BUN: 27 mg/dL — ABNORMAL HIGH (ref 8–23)
CO2: 26 mmol/L (ref 22–32)
Calcium: 8.7 mg/dL — ABNORMAL LOW (ref 8.9–10.3)
Chloride: 105 mmol/L (ref 98–111)
Creatinine, Ser: 1.03 mg/dL — ABNORMAL HIGH (ref 0.44–1.00)
GFR, Estimated: 55 mL/min — ABNORMAL LOW (ref >60)
Glucose, Bld: 81 mg/dL (ref 70–99)
Potassium: 4.4 mmol/L (ref 3.5–5.1)
Sodium: 135 mmol/L (ref 135–145)

## 2020-12-09 LAB — CBC
HCT: 24.5 % — ABNORMAL LOW (ref 36.0–46.0)
Hemoglobin: 7.5 g/dL — ABNORMAL LOW (ref 12.0–15.0)
MCH: 26.8 pg (ref 26.0–34.0)
MCHC: 30.6 g/dL (ref 30.0–36.0)
MCV: 87.5 fL (ref 80.0–100.0)
Platelets: 326 10*3/uL (ref 150–400)
RBC: 2.8 MIL/uL — ABNORMAL LOW (ref 3.87–5.11)
RDW: 17 % — ABNORMAL HIGH (ref 11.5–15.5)
WBC: 13.2 10*3/uL — ABNORMAL HIGH (ref 4.0–10.5)
nRBC: 0 % (ref 0.0–0.2)

## 2020-12-09 LAB — ECHOCARDIOGRAM COMPLETE
AR max vel: 2.07 cm2
AV Area VTI: 2.11 cm2
AV Area mean vel: 1.91 cm2
AV Mean grad: 11 mmHg
AV Peak grad: 18.5 mmHg
Ao pk vel: 2.15 m/s
Area-P 1/2: 3.02 cm2
Height: 65 in
S' Lateral: 2.4 cm
Weight: 2469.15 oz

## 2020-12-09 MED ORDER — INSULIN ASPART 100 UNIT/ML IJ SOLN
3.0000 [IU] | Freq: Three times a day (TID) | INTRAMUSCULAR | Status: DC
  Administered 2020-12-09 – 2020-12-19 (×17): 3 [IU] via SUBCUTANEOUS
  Filled 2020-12-09 (×18): qty 1

## 2020-12-09 MED ORDER — POLYETHYLENE GLYCOL 3350 17 G PO PACK
17.0000 g | PACK | Freq: Every day | ORAL | Status: DC
  Administered 2020-12-09 – 2020-12-19 (×8): 17 g via ORAL
  Filled 2020-12-09 (×11): qty 1

## 2020-12-09 MED ORDER — BISACODYL 5 MG PO TBEC: 10.0000 mg | DELAYED_RELEASE_TABLET | Freq: Every day | ORAL | Status: DC | PRN

## 2020-12-09 MED ORDER — BISACODYL 10 MG RE SUPP: 10.0000 mg | Freq: Every day | RECTAL | Status: DC | PRN

## 2020-12-09 MED ORDER — DOCUSATE SODIUM 100 MG PO CAPS
100.0000 mg | ORAL_CAPSULE | Freq: Every day | ORAL | Status: DC
  Administered 2020-12-09 – 2020-12-19 (×11): 100 mg via ORAL
  Filled 2020-12-09 (×11): qty 1

## 2020-12-09 NOTE — Progress Notes (Signed)
PROGRESS NOTE    Sierra NajjarRena M Bowlby  ZOX:[id number] DOB: 12-Sep-1940 DOA: 12/04/2020 PCP: System, Provider Not In   Brief Narrative:  Sierra Guzman is a 80 y.o. 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  Patient admitted for acute on chronic hypoxic respiratory failure in setting of ILD flare with profound ambulatory dysfunction hypoxia and dyspnea with exertion.  Assessment & Plan:  Acute on chronic hypoxic respiratory failure likely due to exacerbation of interstitial lung disease/interstitial pulmonary fibrosis. -Continue nebs, steroids, supportive care, hold antibiotics at this time -Continue respiratory therapy, incentive spirometry, flutter -Early ambulation -Continue to wean oxygen back to home baseline of 2 L at rest and 5 L with exertion -We will likely need close follow-up in the outpatient setting with pulmonology  -Agreed for SNF placement Follow 2D echocardiogram for persistent dyspnea on exertion, to assess LVEF   Anemia of chronic disease,and iron deficiency.  Hemoglobin 7.6, Denied any GI bleeding.  FOBT negative Patient several years ago in the past Iron deficiency, started IV Venofer 300 milligrams daily for 3 days Folic acid within normal range, B12 elevated, patient does not need B12 supplement at this time   Vitamin D insufficiency, V.D level 28, started vitamin D supplement.   Acute kidney injury superimposed on stage IIIb chronic kidney disease, resolving. -Transition to p.o., DC IV fluids  Essential hypertension. - We will place the patient on as needed IV labetalol   -Holding home HCTZ and ARB and ACE - unclear why patient is on both ARB and ACE inhibitor, will need to follow-up with PCP.  Type II diabetes mellitus, uncontrolled with hyperglycemia. - We will place the patient on supplement coverage with NovoLog and hold off metformin at this time. -A1c 8.1 -lengthy discussion about dietary compliance  Dyslipidemia. - Statin therapy and fish oil continued.  GERD. - PPI therapy will be resumed.    DVT prophylaxis: Lovenox. Code Status: full code. Family Communication:  The plan of care was discussed in details with the patient and family at bedside  Status is: Inpatient  Dispo: The patient is from: Home              Anticipated d/c is to: SNF              Anticipated d/c date is: when bed is available               Patient currently stable for discharge  Consultants:  None  Procedures:  None  Antimicrobials:  None  Subjective: No significant overnight events, patient is still complaining of dyspnea on exertion, feels some improvement.  Denied any chest pain or palpitations, no any abdominal pain.   Objective: Vitals:   12/08/20 2035 12/08/20 2103 12/09/20 0424 12/09/20 0735  BP:  125/69 107/68 (!) 120/59  Pulse:  75 75 70  Resp:  20 20   Temp:  98.2 F (36.8 C) 98 F (36.7 C) 97.9 F (36.6 C)  TempSrc:  Oral Oral Oral  SpO2: 98% 96% 96% 100%  Weight:      Height:        Intake/Output Summary (Last 24 hours) at 12/09/2020 1255 Last  data filed at 12/09/2020 1028 Gross per 24 hour  Intake 724.58 ml  Output --  Net 724.58 ml   Filed Weights   12/05/20 0540  Weight: 70 kg    Examination:  General:  Pleasantly resting in bed, No acute distress. HEENT:  Normocephalic atraumatic.  Sclerae nonicteric, noninjected.  Extraocular movements intact bilaterally. Neck:  Without mass or deformity.  Trachea is midline. Lungs: Diffuse bilateral rhonchi without overt wheeze or rales. Heart:  Regular rate and rhythm.  Without murmurs,  rubs, or gallops. Abdomen:  Soft, nontender, nondistended.  Without guarding or rebound. Extremities: Without cyanosis, clubbing, edema, or obvious deformity. Vascular:  Dorsalis pedis and posterior tibial pulses palpable bilaterally. Skin:  Warm and dry, no erythema, no ulcerations.   Data Reviewed: I have personally reviewed following labs and imaging studies  CBC: Recent Labs  Lab 12/05/20 0623 12/06/20 0520 12/07/20 0515 12/08/20 0537 12/09/20 0213  WBC 8.9 10.9* 11.7* 11.4* 13.2*  HGB 7.4* 7.3* 7.8* 7.6* 7.5*  HCT 24.2* 23.6* 25.2* 25.0* 24.5*  MCV 86.4 87.4 87.2 85.0 87.5  PLT 280 309 335 331 326   Basic Metabolic Panel: Recent Labs  Lab 12/05/20 0623 12/06/20 0520 12/07/20 0515 12/08/20 0537 12/09/20 0213  NA 138 132* 134* 135 135  K 4.3 4.4 3.9 4.0 4.4  CL 107 101 100 101 105  CO2 25 25 26 28 26   GLUCOSE 221* 195* 136* 180* 81  BUN 19 27* 28* 25* 27*  CREATININE 1.19* 1.27* 1.15* 1.05* 1.03*  CALCIUM 8.9 8.8* 9.0 8.7* 8.7*   GFR: Estimated Creatinine Clearance: 42.1 mL/min (A) (by C-G formula based on SCr of 1.03 mg/dL (H)). Liver Function Tests: Recent Labs  Lab 12/04/20 1920  AST 24  ALT 13  ALKPHOS 55  BILITOT 0.3  PROT 6.8  ALBUMIN 2.9*   Recent Labs  Lab 12/04/20 2330  LIPASE 28   No results for input(s): AMMONIA in the last 168 hours. Coagulation Profile: Recent Labs  Lab 12/04/20 2330  INR 1.1   Cardiac Enzymes: No results for input(s): CKTOTAL, CKMB, CKMBINDEX, TROPONINI in the last 168 hours. BNP (last 3 results) No results for input(s): PROBNP in the last 8760 hours. HbA1C: No results for input(s): HGBA1C in the last 72 hours. CBG: Recent Labs  Lab 12/08/20 1136 12/08/20 1647 12/08/20 2013 12/09/20 0734 12/09/20 1123  GLUCAP 256* 258* 308* 124* 224*   Lipid Profile: No results for input(s): CHOL, HDL, LDLCALC, TRIG, CHOLHDL, LDLDIRECT in the last 72 hours. Thyroid Function Tests: No results for input(s): TSH,  T4TOTAL, FREET4, T3FREE, THYROIDAB in the last 72 hours. Anemia Panel: Recent Labs    12/08/20 1035  VITAMINB12 1,399*  FOLATE 14.0  TIBC 347  IRON 26*   Sepsis Labs: Recent Labs  Lab 12/04/20 2330  LATICACIDVEN 1.5    Recent Results (from the past 240 hour(s))  Blood culture (routine single)     Status: None (Preliminary result)   Collection Time: 12/04/20 11:30 PM   Specimen: BLOOD  Result Value Ref Range Status   Specimen Description BLOOD BLOOD LEFT HAND  Final   Special Requests   Final    BOTTLES DRAWN AEROBIC AND ANAEROBIC Blood Culture adequate volume   Culture   Final    NO GROWTH 4 DAYS Performed at [facility], [address], [place], [place]    Report Status PENDING  Incomplete  Urine Culture     Status: None   Collection Time: 12/04/20 11:30 PM  Specimen: In/Out Cath Urine  Result Value Ref Range Status   Specimen Description   Final    IN/OUT CATH URINE Performed at [facility], [address] [place], [place] [address]    Special Requests   Final    NONE Performed at [facility], [address], [place], [place] [address]    Culture   Final    NO GROWTH Performed at [facility], [address] [place], [place] [address]    Report Status 12/06/2020 FINAL  Final  Resp Panel by RT-PCR (Flu A&B, Covid) Nasopharyngeal Swab     Status: None   Collection Time: 12/04/20 11:30 PM   Specimen: Nasopharyngeal Swab; Nasopharyngeal(NP) swabs in vial transport medium  Result Value Ref Range Status   SARS Coronavirus 2 by RT PCR NEGATIVE NEGATIVE Final    Comment: (NOTE) SARS-CoV-2 target nucleic acids are NOT DETECTED.  The SARS-CoV-2 RNA is generally detectable in upper respiratory specimens during the acute phase of infection. The lowest concentration of SARS-CoV-2 viral copies this assay can detect is 138 copies/mL. A negative result does not preclude SARS-Cov-2 infection and should not be used as the  sole basis for treatment or other patient management decisions. A negative result may occur with  improper specimen collection/handling, submission of specimen other than nasopharyngeal swab, presence of viral mutation(s) within the areas targeted by this assay, and inadequate number of viral copies(<138 copies/mL). A negative result must be combined with clinical observations, patient history, and epidemiological information. The expected result is Negative.  Fact Sheet for Patients:  BloggerCourse.com  Fact Sheet for Healthcare Providers:  SeriousBroker.it  This test is no t yet approved or cleared by the [place] FDA and  has been authorized for detection and/or diagnosis of SARS-CoV-2 by FDA under an Emergency Use Authorization (EUA). This EUA will remain  in effect (meaning this test can be used) for the duration of the COVID-19 declaration under Section 564(b)(1) of the Act, 21 U.S.C.section 360bbb-3(b)(1), unless the authorization is terminated  or revoked sooner.       Influenza A by PCR NEGATIVE NEGATIVE Final   Influenza B by PCR NEGATIVE NEGATIVE Final    Comment: (NOTE) The Xpert Xpress SARS-CoV-2/FLU/RSV plus assay is intended as an aid in the diagnosis of influenza from Nasopharyngeal swab specimens and should not be used as a sole basis for treatment. Nasal washings and aspirates are unacceptable for Xpert Xpress SARS-CoV-2/FLU/RSV testing.  Fact Sheet for Patients: BloggerCourse.com  Fact Sheet for Healthcare Providers: SeriousBroker.it  This test is not yet approved or cleared by the [place] FDA and has been authorized for detection and/or diagnosis of SARS-CoV-2 by FDA under an Emergency Use Authorization (EUA). This EUA will remain in effect (meaning this test can be used) for the duration of the COVID-19 declaration under Section 564(b)(1) of the  Act, 21 U.S.C. section 360bbb-3(b)(1), unless the authorization is terminated or revoked.  Performed at [facility], [address], [place], [place] [address]      Radiology Studies: No results found.   Scheduled Meds:  docusate sodium  100 mg Oral Daily   enoxaparin (LOVENOX) injection  40 mg Subcutaneous Q24H   insulin aspart  0-20 Units Subcutaneous TID AC & HS   insulin aspart  3 Units Subcutaneous TID WC   irbesartan  300 mg Oral Daily   loratadine  10 mg Oral Daily   mometasone-formoterol  2 puff Inhalation BID   Pirfenidone  2  tablet Oral TID   polyethylene glycol  17 g Oral Daily   rosuvastatin  40 mg Oral Daily   umeclidinium bromide  1 puff Inhalation Daily   Vitamin D (Ergocalciferol)  50,000 Units Oral Q7 days     LOS: 4 days   Time spent:  Gillis Santa, DO [facility]  If 7PM-7AM, please contact night-coverage [email or web address]  12/09/2020, 12:55 PM

## 2020-12-09 NOTE — TOC Progression Note (Signed)
Transition of Care [facility]) - Progression Note    Patient Details  Name: Sierra Guzman MRN: [id number] Date of Birth: March 21, 1941  [facility]) CM/SW Contact  Chapman Fitch, RN Phone Number: 12/09/2020, 3:55 PM  Clinical Narrative:     VM left on main phone number to accept bed offer and request insurance auth to be started   Expected Discharge Plan: Home w Home Health Services Barriers to Discharge: Continued Medical Work up  Expected Discharge Plan and Services Expected Discharge Plan: Home w Home Health Services   Discharge Planning Services: CM Consult   Living arrangements for the past 2 months: Single Family Home                           HH Arranged: RN, PT, OT [facility] Agency: [facility] Date [facility] Agency Contacted: 12/07/20   Representative spoke with at [facility] Agency: [place]   Social Determinants of Health (SDOH) Interventions    Readmission Risk Interventions No flowsheet data found.

## 2020-12-09 NOTE — TOC Progression Note (Signed)
Transition of Care [facility]) - Progression Note    Patient Details  Name: Sierra Guzman MRN: [id number] Date of Birth: 1940-05-06  Transition of Care [facility]) CM/SW Contact  Chapman Fitch, RN Phone Number: 12/09/2020, 9:31 AM  Clinical Narrative:     Bed offers presented to daughter [name].  Hannah Beat accepted Accepted in [place].  VM left for admission coordinator Marchelle Folks to notify and request that auth be initiated    Expected Discharge Plan: Home w Home Health Services Barriers to Discharge: Continued Medical Work up  Expected Discharge Plan and Services Expected Discharge Plan: Home w Home Health Services   Discharge Planning Services: CM Consult   Living arrangements for the past 2 months: Single Family Home                           HH Arranged: RN, PT, OT [facility] Agency: [facility] Date [facility] Agency Contacted: 12/07/20   Representative spoke with at [facility] Agency: [place]   Social Determinants of Health (SDOH) Interventions    Readmission Risk Interventions No flowsheet data found.

## 2020-12-09 NOTE — Progress Notes (Addendum)
Inpatient Diabetes Program Recommendations  AACE/ADA: New Consensus Statement on Inpatient Glycemic Control   Target Ranges:  Prepandial:   less than 140 mg/dL      Peak postprandial:   less than 180 mg/dL (1-2 hours)      Critically ill patients:  140 - 180 mg/dL   Results for Sierra Guzman, Sierra Guzman (MRN [id number]) as of 12/09/2020 09:28  Ref. Range 12/08/2020 07:58 12/08/2020 11:36 12/08/2020 16:47 12/08/2020 20:13 12/09/2020 07:34  Glucose-Capillary Latest Ref Range: 70 - 99 mg/dL 627 (H)  Novolog 3 units 256 (H)  Novolog 11 units 258 (H)  Novolog 11 units 308 (H)  Novolog 15 units 124 (H)    Review of Glycemic Control  Diabetes history: DM2 Outpatient Diabetes medications: Metformin 1000 mg QPM Current orders for Inpatient glycemic control: Novolog 0-20 units AC&HS; Prednisone 40 mg QAM   Inpatient Diabetes Program Recommendations:     Insulin: Please consider ordering Novolog 3 units TID with meals for meal coverage if patient eats at least 50% of meals.   Thanks Orlando Penner, RN, MSN, CDE Diabetes Coordinator Inpatient Diabetes Program [phone] (Team Pager from 8am to 5pm)

## 2020-12-09 NOTE — Progress Notes (Signed)
*  PRELIMINARY RESULTS* Echocardiogram 2D Echocardiogram has been performed.  Sierra Guzman 12/09/2020, 8:16 AM

## 2020-12-09 NOTE — Progress Notes (Signed)
Physical Therapy Treatment Patient Details Name: Sierra Guzman MRN: [id number] DOB: 12/11/1940 Today's Date: 12/09/2020    History of Present Illness Pt is an 80 y/o F admitted on 12/04/20 with c/c of DOE that has been worsening over the past couple days. Pt is being treated for acute on chronic hypoxic respiratory failure likely due to exacerbation of interstitial lung disease/interstitial pulmonary fibrosis. PMH: asthma, stage 3 CKD, CAD, DM2, gout, dyslipidemia, GERD    PT Comments    Patient alert, in recliner, agreeable to PT. Recently saw mobility tech but more than willing to work with PT today as well. Session focused on promoting pt endurance and activity tolerance, deficits still limiting pt functional mobility and safety. She was able to perform sit <> stand with RW and supervision, and ambulated ~42ft, three times with appropriate sitting rest breaks. On 6L for all mobility. Pt endorsed fatigue after activity but remains highly motivated to improve. In chair with all needs in reach at end of session. Recommendation remains appropriate due to decline in function from PLOF and to maximize pt independence and safety.      Follow Up Recommendations  SNF;Supervision for mobility/OOB     Equipment Recommendations  None recommended by PT    Recommendations for Other Services       Precautions / Restrictions Precautions Precautions: Fall Restrictions Weight Bearing Restrictions: No    Mobility  Bed Mobility               General bed mobility comments: pt up in recliner at start/end of session    Transfers Overall transfer level: Needs assistance Equipment used: Rolling walker (2 wheeled) Transfers: Sit to/from Stand Sit to Stand: Supervision            Ambulation/Gait Ambulation/Gait assistance: Supervision Gait Distance (Feet):  (61ft x 3 rounds) Assistive device: Rolling walker (2 wheeled) Gait Pattern/deviations: Decreased stride length Gait velocity:  decreased   General Gait Details: cued for continued breathing   Stairs             Wheelchair Mobility    Modified Rankin (Stroke Patients Only)       Balance Overall balance assessment: Needs assistance Sitting-balance support: Feet supported;Bilateral upper extremity supported Sitting balance-Leahy Scale: Normal     Standing balance support: Bilateral upper extremity supported;During functional activity Standing balance-Leahy Scale: Good                              Cognition Arousal/Alertness: Awake/alert Behavior During Therapy: WFL for tasks assessed/performed Overall Cognitive Status: Within Functional Limits for tasks assessed                                        Exercises      General Comments        Pertinent Vitals/Pain Pain Assessment: No/denies pain    Home Living                      Prior Function            PT Goals (current goals can now be found in the care plan section) Progress towards PT goals: Progressing toward goals    Frequency    Min 2X/week      PT Plan Current plan remains appropriate    Co-evaluation  AM-PAC PT "6 Clicks" Mobility   Outcome Measure  Help needed turning from your back to your side while in a flat bed without using bedrails?: A Little Help needed moving from lying on your back to sitting on the side of a flat bed without using bedrails?: A Little Help needed moving to and from a bed to a chair (including a wheelchair)?: A Little Help needed standing up from a chair using your arms (e.g., wheelchair or bedside chair)?: A Little Help needed to walk in hospital room?: None Help needed climbing 3-5 steps with a railing? : A Lot 6 Click Score: 18    End of Session Equipment Utilized During Treatment: Gait belt;Oxygen Activity Tolerance: Patient tolerated treatment well Patient left: in chair;with chair alarm set;with call bell/phone within  reach Nurse Communication: Mobility status PT Visit Diagnosis: Muscle weakness (generalized) (M62.81)     Time: 7353-2992 PT Time Calculation (min) (ACUTE ONLY): 32 min  Charges:  $Therapeutic Exercise: 23-37 mins                     Olga Coaster PT, DPT 1:38 PM,12/09/20

## 2020-12-09 NOTE — TOC Progression Note (Signed)
Transition of Care [facility]) - Progression Note    Patient Details  Name: Sierra Guzman MRN: [id number] Date of Birth: [birth year]  [facility] CM/SW Contact  Chapman Fitch, RN Phone Number: 12/09/2020, 1:11 PM  Clinical Narrative:     2nd message left for [facility] at [facility] to accept bed and request for them to start insurance auth  Expected Discharge Plan: Home w Home Health Services Barriers to Discharge: Continued Medical Work up  Expected Discharge Plan and Services Expected Discharge Plan: Home w Home Health Services   Discharge Planning Services: CM Consult   Living arrangements for the past 2 months: Single Family Home                           HH Arranged: RN, PT, OT [facility] Agency: [facility] Date [facility] Agency Contacted: 12/07/20   Representative spoke with at [facility] Agency: [place]   Social Determinants of Health (SDOH) Interventions    Readmission Risk Interventions No flowsheet data found.

## 2020-12-09 NOTE — Progress Notes (Signed)
Mobility Specialist - Progress Note   12/09/20 1344  Mobility  Activity Ambulated in hall;Ambulated in room;Transferred:  Bed to chair  Level of Assistance Standby assist, set-up cues, supervision of patient - no hands on  Assistive Device Front wheel walker  Distance Ambulated (ft) 50 ft  Mobility Out of bed to chair with meals;Ambulated with assistance in hallway  Mobility Response Tolerated well  Mobility performed by Mobility specialist  $Mobility charge 1 Mobility    Pre-mobility: 100% SpO2 During mobility: 87% SpO2 Post-mobility: 92% SpO2   Pt utilizing 2L on arrival, bumped up to 6L for OOB activity. Pt ambulated just outside doorway, 1 rest break taken during activity to manage breathing and practice energy conservation. O2 desat to 87% every ~20'. Pt returned to recliner, O2 desat to a low of 80% once seated. Returned to 2L prior to exit. Pt disappointed in performance, encouragement provided. RN notified.    Filiberto Pinks Mobility Specialist 12/09/20, 1:48 PM

## 2020-12-10 LAB — CULTURE, BLOOD (SINGLE)
Culture: NO GROWTH
Special Requests: ADEQUATE

## 2020-12-10 LAB — CBC
HCT: 26.3 % — ABNORMAL LOW (ref 36.0–46.0)
Hemoglobin: 7.9 g/dL — ABNORMAL LOW (ref 12.0–15.0)
MCH: 26 pg (ref 26.0–34.0)
MCHC: 30 g/dL (ref 30.0–36.0)
MCV: 86.5 fL (ref 80.0–100.0)
Platelets: 340 10*3/uL (ref 150–400)
RBC: 3.04 MIL/uL — ABNORMAL LOW (ref 3.87–5.11)
RDW: 17.3 % — ABNORMAL HIGH (ref 11.5–15.5)
WBC: 13 10*3/uL — ABNORMAL HIGH (ref 4.0–10.5)
nRBC: 0 % (ref 0.0–0.2)

## 2020-12-10 LAB — BASIC METABOLIC PANEL
Anion gap: 4 — ABNORMAL LOW (ref 5–15)
BUN: 25 mg/dL — ABNORMAL HIGH (ref 8–23)
CO2: 28 mmol/L (ref 22–32)
Calcium: 9.2 mg/dL (ref 8.9–10.3)
Chloride: 103 mmol/L (ref 98–111)
Creatinine, Ser: 1.2 mg/dL — ABNORMAL HIGH (ref 0.44–1.00)
GFR, Estimated: 45 mL/min — ABNORMAL LOW (ref >60)
Glucose, Bld: 108 mg/dL — ABNORMAL HIGH (ref 70–99)
Potassium: 4.4 mmol/L (ref 3.5–5.1)
Sodium: 135 mmol/L (ref 135–145)

## 2020-12-10 LAB — GLUCOSE, CAPILLARY
Glucose-Capillary: 114 mg/dL — ABNORMAL HIGH (ref 70–99)
Glucose-Capillary: 169 mg/dL — ABNORMAL HIGH (ref 70–99)
Glucose-Capillary: 116 mg/dL — ABNORMAL HIGH (ref 70–99)
Glucose-Capillary: 239 mg/dL — ABNORMAL HIGH (ref 70–99)

## 2020-12-10 MED ORDER — SODIUM CHLORIDE 0.9 % IV SOLN: INTRAVENOUS | Status: AC

## 2020-12-10 NOTE — Progress Notes (Signed)
Mobility Specialist - Progress Note   12/10/20 1500  Mobility  Activity Refused mobility  Mobility performed by Mobility specialist    Pt politely declined. Just finished ambulating with PT moments prior, needing time to rest and recover. Will attempt session another date.   Filiberto Pinks Mobility Specialist 12/10/20, 3:13 PM

## 2020-12-10 NOTE — Progress Notes (Signed)
PROGRESS NOTE    Sierra NajjarRena M Guzman  ZOX:[id number] DOB: [birth year] DOA: 12/04/2020 PCP: System, Provider Not In   Brief Narrative:  Sierra Guzman is a 80 y.o. 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  Patient admitted for acute on chronic hypoxic respiratory failure in setting of ILD flare with profound ambulatory dysfunction hypoxia and dyspnea with exertion.  Assessment & Plan:  Acute on chronic hypoxic respiratory failure likely due to exacerbation of interstitial lung disease/interstitial pulmonary fibrosis. -Continue nebs, steroids, supportive care, hold antibiotics at this time -Continue respiratory therapy, incentive spirometry, flutter -Early ambulation -Continue to wean oxygen back to home baseline of 2 L at rest and 5 L with exertion -We will likely need close follow-up in the outpatient setting with pulmonology  -Agreed for SNF placement TTE shows LVEF 6065%, grade 1 diastolic dysfunction.  No any other significant findings.    Anemia of chronic disease,and iron deficiency.  Hemoglobin 7.6, Denied any GI bleeding.  FOBT negative Patient several years ago in the past Iron deficiency, started IV Venofer 300 milligrams daily for 3 days Folic acid within normal range, B12 elevated, patient does not need B12 supplement at this time   Vitamin D insufficiency, V.D level 28, started vitamin D supplement.   Acute kidney injury superimposed on stage IIIb chronic kidney disease, resolving. -Transition to p.o., DC IV fluids  Essential hypertension. - We will place the patient on as needed IV  labetalol  -Holding home HCTZ and ARB and ACE - unclear why patient is on both ARB and ACE inhibitor, will need to follow-up with PCP.  Type II diabetes mellitus, uncontrolled with hyperglycemia. - We will place the patient on supplement coverage with NovoLog and hold off metformin at this time. -A1c 8.1 -lengthy discussion about dietary compliance  Dyslipidemia. - Statin therapy and fish oil continued.  GERD. - PPI therapy will be resumed.    DVT prophylaxis: Lovenox. Code Status: full code. Family Communication:  The plan of care was discussed in details with the patient and family at bedside  Status is: Inpatient  Dispo: The patient is from: Home              Anticipated d/c is to: SNF              Anticipated d/c date is: when bed is available               Patient currently stable for discharge  Consultants:  None  Procedures:  None  Antimicrobials:  None  Subjective: No significant overnight events, patient still has dyspnea on exertion but feels improvement, denied any chest palpitation, no any other active issues. Patient is still willing to go to rehab  Objective: Vitals:   12/10/20 0518 12/10/20 0724 12/10/20 0802 12/10/20 1532  BP: (!) 121/57  119/64 (!) 105/55  Pulse: 64  69 87  Resp: 18  (!) 24 20  Temp: 97.7 F (36.5 C)  98.2 F (36.8 C) 97.8 F (36.6 C)  TempSrc: Oral  Oral Oral  SpO2:  100%  95%  Weight:      Height:  Intake/Output Summary (Last 24 hours) at 12/10/2020 1801 Last data filed at 12/10/2020 1413 Gross per 24 hour  Intake 720 ml  Output 900 ml  Net -180 ml   Filed Weights   12/05/20 0540  Weight: 70 kg    Examination:  General:  Pleasantly resting in bed, No acute distress. HEENT:  Normocephalic atraumatic.  Sclerae nonicteric, noninjected.  Extraocular movements intact bilaterally. Neck:  Without mass or deformity.  Trachea is midline. Lungs: Diffuse bilateral rhonchi without overt wheeze or rales. Heart:  Regular  rate and rhythm.  Without murmurs, rubs, or gallops. Abdomen:  Soft, nontender, nondistended.  Without guarding or rebound. Extremities: Without cyanosis, clubbing, edema, or obvious deformity. Vascular:  Dorsalis pedis and posterior tibial pulses palpable bilaterally. Skin:  Warm and dry, no erythema, no ulcerations.   Data Reviewed: I have personally reviewed following labs and imaging studies  CBC: Recent Labs  Lab 12/06/20 0520 12/07/20 0515 12/08/20 0537 12/09/20 0213 12/10/20 0637  WBC 10.9* 11.7* 11.4* 13.2* 13.0*  HGB 7.3* 7.8* 7.6* 7.5* 7.9*  HCT 23.6* 25.2* 25.0* 24.5* 26.3*  MCV 87.4 87.2 85.0 87.5 86.5  PLT 309 335 331 326 340   Basic Metabolic Panel: Recent Labs  Lab 12/06/20 0520 12/07/20 0515 12/08/20 0537 12/09/20 0213 12/10/20 0637  NA 132* 134* 135 135 135  K 4.4 3.9 4.0 4.4 4.4  CL 101 100 101 105 103  CO2 25 26 28 26 28   GLUCOSE 195* 136* 180* 81 108*  BUN 27* 28* 25* 27* 25*  CREATININE 1.27* 1.15* 1.05* 1.03* 1.20*  CALCIUM 8.8* 9.0 8.7* 8.7* 9.2   GFR: Estimated Creatinine Clearance: 36.1 mL/min (A) (by C-G formula based on SCr of 1.2 mg/dL (H)). Liver Function Tests: Recent Labs  Lab 12/04/20 1920  AST 24  ALT 13  ALKPHOS 55  BILITOT 0.3  PROT 6.8  ALBUMIN 2.9*   Recent Labs  Lab 12/04/20 2330  LIPASE 28   No results for input(s): AMMONIA in the last 168 hours. Coagulation Profile: Recent Labs  Lab 12/04/20 2330  INR 1.1   Cardiac Enzymes: No results for input(s): CKTOTAL, CKMB, CKMBINDEX, TROPONINI in the last 168 hours. BNP (last 3 results) No results for input(s): PROBNP in the last 8760 hours. HbA1C: No results for input(s): HGBA1C in the last 72 hours. CBG: Recent Labs  Lab 12/09/20 1633 12/09/20 2053 12/10/20 0759 12/10/20 1143 12/10/20 1646  GLUCAP 230* 174* 114* 169* 116*   Lipid Profile: No results for input(s): CHOL, HDL, LDLCALC, TRIG, CHOLHDL, LDLDIRECT in the last 72 hours. Thyroid Function  Tests: No results for input(s): TSH, T4TOTAL, FREET4, T3FREE, THYROIDAB in the last 72 hours. Anemia Panel: Recent Labs    12/08/20 1035  VITAMINB12 1,399*  FOLATE 14.0  TIBC 347  IRON 26*   Sepsis Labs: Recent Labs  Lab 12/04/20 2330  LATICACIDVEN 1.5    Recent Results (from the past 240 hour(s))  Blood culture (routine single)     Status: None   Collection Time: 12/04/20 11:30 PM   Specimen: BLOOD  Result Value Ref Range Status   Specimen Description BLOOD BLOOD LEFT HAND  Final   Special Requests   Final    BOTTLES DRAWN AEROBIC AND ANAEROBIC Blood Culture adequate volume   Culture   Final    NO GROWTH 5 DAYS Performed at [facility], [address] [place], [place]    Report Status 12/10/2020 FINAL  Final  Urine Culture  Status: None   Collection Time: 12/04/20 11:30 PM   Specimen: In/Out Cath Urine  Result Value Ref Range Status   Specimen Description   Final    IN/OUT CATH URINE Performed at [facility], [address], [place], [place] [address]    Special Requests   Final    NONE Performed at [facility], [address] [place], [place] [address]    Culture   Final    NO GROWTH Performed at [facility], [address] [place], [place] [address]    Report Status 12/06/2020 FINAL  Final  Resp Panel by RT-PCR (Flu A&B, Covid) Nasopharyngeal Swab     Status: None   Collection Time: 12/04/20 11:30 PM   Specimen: Nasopharyngeal Swab; Nasopharyngeal(NP) swabs in vial transport medium  Result Value Ref Range Status   SARS Coronavirus 2 by RT PCR NEGATIVE NEGATIVE Final    Comment: (NOTE) SARS-CoV-2 target nucleic acids are NOT DETECTED.  The SARS-CoV-2 RNA is generally detectable in upper respiratory specimens during the acute phase of infection. The lowest concentration of SARS-CoV-2 viral copies this assay can detect is 138 copies/mL. A negative result does not preclude SARS-Cov-2 infection and  should not be used as the sole basis for treatment or other patient management decisions. A negative result may occur with  improper specimen collection/handling, submission of specimen other than nasopharyngeal swab, presence of viral mutation(s) within the areas targeted by this assay, and inadequate number of viral copies(<138 copies/mL). A negative result must be combined with clinical observations, patient history, and epidemiological information. The expected result is Negative.  Fact Sheet for Patients:  BloggerCourse.com  Fact Sheet for Healthcare Providers:  SeriousBroker.it  This test is no t yet approved or cleared by the [place] FDA and  has been authorized for detection and/or diagnosis of SARS-CoV-2 by FDA under an Emergency Use Authorization (EUA). This EUA will remain  in effect (meaning this test can be used) for the duration of the COVID-19 declaration under Section 564(b)(1) of the Act, 21 U.S.C.section 360bbb-3(b)(1), unless the authorization is terminated  or revoked sooner.       Influenza A by PCR NEGATIVE NEGATIVE Final   Influenza B by PCR NEGATIVE NEGATIVE Final    Comment: (NOTE) The Xpert Xpress SARS-CoV-2/FLU/RSV plus assay is intended as an aid in the diagnosis of influenza from Nasopharyngeal swab specimens and should not be used as a sole basis for treatment. Nasal washings and aspirates are unacceptable for Xpert Xpress SARS-CoV-2/FLU/RSV testing.  Fact Sheet for Patients: BloggerCourse.com  Fact Sheet for Healthcare Providers: SeriousBroker.it  This test is not yet approved or cleared by the [place] FDA and has been authorized for detection and/or diagnosis of SARS-CoV-2 by FDA under an Emergency Use Authorization (EUA). This EUA will remain in effect (meaning this test can be used) for the duration of the COVID-19 declaration  under Section 564(b)(1) of the Act, 21 U.S.C. section 360bbb-3(b)(1), unless the authorization is terminated or revoked.  Performed at [facility], [address], [place], [place] [address]      Radiology Studies: ECHOCARDIOGRAM COMPLETE  Result Date: 12/09/2020    ECHOCARDIOGRAM REPORT   Patient Name:   CANDEE HOON Date of Exam: 12/09/2020 Medical Rec #:  [id number]    Height:       65.0 in Accession #:    [id number]   Weight:       154.3 lb Date of Birth:  03-05-1941  BSA:          1.772 m Patient Age:    80 years     BP:           120/59 mmHg Patient Gender: F            HR:           70 bpm. Exam Location:  [facility] Procedure: 2D Echo, Cardiac Doppler, Color Doppler and Strain Analysis Indications:     Dyspnea R06.00  History:         Patient has no prior history of Echocardiogram examinations.                  Risk Factors:Hypertension and Diabetes.  Sonographer:     Cristela Blue Referring Phys:  [id number] Gillis Santa Diagnosing Phys: Julien Nordmann MD  Sonographer Comments: Global longitudinal strain was attempted. IMPRESSIONS  1. Left ventricular ejection fraction, by estimation, is 60 to 65%. The left ventricle has normal function. The left ventricle has no regional wall motion abnormalities. Left ventricular diastolic parameters are consistent with Grade I diastolic dysfunction (impaired relaxation). The average left ventricular global longitudinal strain is -16.6 %. The global longitudinal strain is normal.  2. Right ventricular systolic function is normal. The right ventricular size is normal.  3. The aortic valve is normal in structure. Aortic valve regurgitation is not visualized. Mild to moderate aortic valve sclerosis/calcification is present, without any evidence of aortic stenosis. FINDINGS  Left Ventricle: Left ventricular ejection fraction, by estimation, is 60 to 65%. The left ventricle has normal function. The left ventricle has no regional wall motion abnormalities. The average  left ventricular global longitudinal strain is -16.6 %. The global longitudinal strain is normal. The left ventricular internal cavity size was normal in size. There is no left ventricular hypertrophy. Left ventricular diastolic parameters are consistent with Grade I diastolic dysfunction (impaired relaxation). Right Ventricle: The right ventricular size is normal. No increase in right ventricular wall thickness. Right ventricular systolic function is normal. Left Atrium: Left atrial size was normal in size. Right Atrium: Right atrial size was normal in size. Pericardium: There is no evidence of pericardial effusion. Mitral Valve: The mitral valve is normal in structure. No evidence of mitral valve regurgitation. No evidence of mitral valve stenosis. Tricuspid Valve: The tricuspid valve is normal in structure. Tricuspid valve regurgitation is mild . No evidence of tricuspid stenosis. Aortic Valve: The aortic valve is normal in structure. Aortic valve regurgitation is not visualized. Mild to moderate aortic valve sclerosis/calcification is present, without any evidence of aortic stenosis. Aortic valve mean gradient measures 11.0 mmHg.  Aortic valve peak gradient measures 18.5 mmHg. Aortic valve area, by VTI measures 2.11 cm. Pulmonic Valve: The pulmonic valve was normal in structure. Pulmonic valve regurgitation is not visualized. No evidence of pulmonic stenosis. Aorta: The aortic root is normal in size and structure. Venous: The inferior vena cava is normal in size with greater than 50% respiratory variability, suggesting right atrial pressure of 3 mmHg. IAS/Shunts: No atrial level shunt detected by color flow Doppler.  LEFT VENTRICLE PLAX 2D LVIDd:         3.90 cm  Diastology LVIDs:         2.40 cm  LV e' medial:    5.33 cm/s LV PW:         1.30 cm  LV E/e' medial:  13.9 LV IVS:        0.80 cm  LV e' lateral:  5.00 cm/s LVOT diam:     2.10 cm  LV E/e' lateral: 14.8 LV SV:         93 LV SV Index:   53       2D  Longitudinal Strain LVOT Area:     3.46 cm 2D Strain GLS Avg:     -16.6 %                          3D Volume EF:                         3D EF:        54 %                         LV EDV:       85 ml                         LV ESV:       39 ml                         LV SV:        46 ml RIGHT VENTRICLE RV Basal diam:  3.70 cm RV S prime:     21.60 cm/s TAPSE (M-mode): 3.2 cm LEFT ATRIUM             Index       RIGHT ATRIUM           Index LA diam:        3.50 cm 1.98 cm/m  RA Area:     21.30 cm LA Vol (A2C):   36.1 ml 20.38 ml/m RA Volume:   63.90 ml  36.07 ml/m LA Vol (A4C):   57.7 ml 32.57 ml/m LA Biplane Vol: 48.3 ml 27.26 ml/m  AORTIC VALVE                    PULMONIC VALVE AV Area (Vmax):    2.07 cm     RVOT Peak grad: 2 mmHg AV Area (Vmean):   1.91 cm AV Area (VTI):     2.11 cm AV Vmax:           215.33 cm/s AV Vmean:          156.667 cm/s AV VTI:            0.441 m AV Peak Grad:      18.5 mmHg AV Mean Grad:      11.0 mmHg LVOT Vmax:         129.00 cm/s LVOT Vmean:        86.600 cm/s LVOT VTI:          0.269 m LVOT/AV VTI ratio: 0.61  AORTA Ao Root diam: 3.20 cm MITRAL VALVE                TRICUSPID VALVE MV Area (PHT): 3.02 cm     TR Peak grad:   22.3 mmHg MV Decel Time: 251 msec     TR Vmax:        236.00 cm/s MV E velocity: 74.10 cm/s MV A velocity: 106.00 cm/s  SHUNTS MV E/A ratio:  0.70         Systemic VTI:  0.27 m  Systemic Diam: 2.10 cm Julien Nordmann MD Electronically signed by Julien Nordmann MD Signature Date/Time: 12/09/2020/5:40:42 PM    Final      Scheduled Meds:  docusate sodium  100 mg Oral Daily   enoxaparin (LOVENOX) injection  40 mg Subcutaneous Q24H   insulin aspart  0-20 Units Subcutaneous TID AC & HS   insulin aspart  3 Units Subcutaneous TID WC   irbesartan  300 mg Oral Daily   loratadine  10 mg Oral Daily   mometasone-formoterol  2 puff Inhalation BID   Pirfenidone  2 tablet Oral TID   polyethylene glycol  17 g Oral Daily   rosuvastatin   40 mg Oral Daily   umeclidinium bromide  1 puff Inhalation Daily   Vitamin D (Ergocalciferol)  50,000 Units Oral Q7 days     LOS: 5 days   Time spent:  Gillis Santa, DO [facility]  If 7PM-7AM, please contact night-coverage [email or web address]  12/10/2020, 6:01 PM

## 2020-12-10 NOTE — Progress Notes (Signed)
Physical Therapy Treatment Patient Details Name: Sierra Guzman MRN: [id number] DOB: 1941/02/14 Today's Date: 12/10/2020    History of Present Illness Pt is an 80 y/o F admitted on 12/04/20 with c/c of DOE that has been worsening over the past couple days. Pt is being treated for acute on chronic hypoxic respiratory failure likely due to exacerbation of interstitial lung disease/interstitial pulmonary fibrosis. PMH: asthma, stage 3 CKD, CAD, DM2, gout, dyslipidemia, GERD    PT Comments    Pt received seated in recliner agreeable and motivated to participate with PT.  Resting on 2 L/min with SPO2 >90% and HR trending in mid 80's to low 90's. Performed seated therex for strengthening prior to ambulation. Pt remains supervision for STS and ambulation in hospital room with good hand placement and sequencing with RW. Gait remains slow and cautious. Requires titrating O2 to 6L/min with gait due to desatting on 2 L on first walking bout (10') to 80-81%. SPO2 returned after seated rest for 2 min with education on pursed lip breathing. Pt ambulated 10' by four bouts with seated rest b/t. Then two bouts of 16' with seated rest between. With last bouts of 16', HR trending to 131-134 BPM with SPO2 remaining > 90%. Pt returned to recliner with all needs within reach. Pt still requiring frequent seated rest due to difficultly breathing and LE weakness but is progressing. Pt will still benefit from STR to return to PLOF of short community distances.    Follow Up Recommendations  SNF;Supervision for mobility/OOB     Equipment Recommendations  None recommended by PT    Recommendations for Other Services       Precautions / Restrictions Precautions Precautions: Fall Restrictions Weight Bearing Restrictions: No    Mobility  Bed Mobility               General bed mobility comments: pt up in recliner at start/end of session    Transfers Overall transfer level: Needs assistance Equipment used:  Rolling walker (2 wheeled) Transfers: Sit to/from Stand Sit to Stand: Supervision            Ambulation/Gait Ambulation/Gait assistance: Supervision (10' x 4 rounds, 16' x2 rounds)   Assistive device: Rolling walker (2 wheeled) Gait Pattern/deviations: Decreased stride length Gait velocity: decreased   General Gait Details: cued for continued breathing   Stairs             Wheelchair Mobility    Modified Rankin (Stroke Patients Only)       Balance Overall balance assessment: Needs assistance Sitting-balance support: Feet supported;Bilateral upper extremity supported Sitting balance-Leahy Scale: Normal     Standing balance support: Bilateral upper extremity supported;During functional activity Standing balance-Leahy Scale: Good                              Cognition Arousal/Alertness: Awake/alert Behavior During Therapy: WFL for tasks assessed/performed Overall Cognitive Status: Within Functional Limits for tasks assessed                                 General Comments: Remains very motivated.      Exercises General Exercises - Lower Extremity Ankle Circles/Pumps: AROM;Seated;Both;20 reps Long Arc Quad: AROM;Strengthening;Both;20 reps Hip Flexion/Marching: AROM;Strengthening;Both;20 reps;Seated Toe Raises: AROM;Strengthening;Both;20 reps;Seated Heel Raises: Seated;Strengthening;AROM;Both    General Comments General comments (skin integrity, edema, etc.): Still relies on 6 L/min with ambulation via  [place]      Pertinent Vitals/Pain Pain Assessment: No/denies pain    Home Living                      Prior Function            PT Goals (current goals can now be found in the care plan section) Acute Rehab PT Goals Patient Stated Goal: get better, figure out what's causing this PT Goal Formulation: With patient Time For Goal Achievement: 12/20/20 Potential to Achieve Goals: Good Progress towards PT goals:  Progressing toward goals    Frequency    Min 2X/week      PT Plan Current plan remains appropriate    Co-evaluation              AM-PAC PT "6 Clicks" Mobility   Outcome Measure  Help needed turning from your back to your side while in a flat bed without using bedrails?: A Little Help needed moving from lying on your back to sitting on the side of a flat bed without using bedrails?: A Little Help needed moving to and from a bed to a chair (including a wheelchair)?: A Little Help needed standing up from a chair using your arms (e.g., wheelchair or bedside chair)?: A Little Help needed to walk in hospital room?: A Little Help needed climbing 3-5 steps with a railing? : A Lot 6 Click Score: 17    End of Session Equipment Utilized During Treatment: Gait belt;Oxygen Activity Tolerance: Patient tolerated treatment well Patient left: in chair;with call bell/phone within reach Nurse Communication: Mobility status PT Visit Diagnosis: Muscle weakness (generalized) (M62.81)     Time: 3329-5188 PT Time Calculation (min) (ACUTE ONLY): 44 min  Charges:  $Therapeutic Exercise: 38-52 mins                    Cordney Barstow M. Fairly IV, PT, DPT Physical Therapist- [facility]  12/10/2020, 2:42 PM

## 2020-12-10 NOTE — Care Management Important Message (Signed)
Important Message  Patient Details  Name: Sierra Guzman MRN: [id number] Date of Birth: 21-Sep-1940   Medicare Important Message Given:  Yes     [name] 12/10/2020, 5:13 PM

## 2020-12-10 NOTE — TOC Progression Note (Addendum)
Transition of Care [facility]) - Progression Note    Patient Details  Name: Sierra Guzman MRN: [id number] Date of Birth: 07-21-1940  Transition of Care [facility]) CM/SW Contact  Liliana Cline, LCSW Phone Number: 12/10/2020, 9:44 AM  Clinical Narrative:   Left 2 VMs for Marchelle Folks with [facility] requesting update on insurance authorization (on both her work cell phone and office phone).  10:32- Message from [place] with [facility] who confirmed she received TOC VM. Asked if they can take patient over the weekend if they get auth, waiting for response.   11:35- Marchelle Folks with [facility] reported they have to withdraw their bed offer. No reason given. Left VM for daughter [name] requesting a return call.    Expected Discharge Plan: Home w Home Health Services Barriers to Discharge: Continued Medical Work up  Expected Discharge Plan and Services Expected Discharge Plan: Home w Home Health Services   Discharge Planning Services: CM Consult   Living arrangements for the past 2 months: Single Family Home                           HH Arranged: RN, PT, OT [facility] Agency: [facility] Date [facility] Agency Contacted: 12/07/20   Representative spoke with at [facility] Agency: [place]   Social Determinants of Health (SDOH) Interventions    Readmission Risk Interventions No flowsheet data found.

## 2020-12-11 LAB — PHOSPHORUS: Phosphorus: 3.2 mg/dL (ref 2.5–4.6)

## 2020-12-11 LAB — CBC
HCT: 26.2 % — ABNORMAL LOW (ref 36.0–46.0)
Hemoglobin: 8 g/dL — ABNORMAL LOW (ref 12.0–15.0)
MCH: 27.4 pg (ref 26.0–34.0)
MCHC: 30.5 g/dL (ref 30.0–36.0)
MCV: 89.7 fL (ref 80.0–100.0)
Platelets: 287 10*3/uL (ref 150–400)
RBC: 2.92 MIL/uL — ABNORMAL LOW (ref 3.87–5.11)
RDW: 17.8 % — ABNORMAL HIGH (ref 11.5–15.5)
WBC: 12.6 10*3/uL — ABNORMAL HIGH (ref 4.0–10.5)
nRBC: 0.3 % — ABNORMAL HIGH (ref 0.0–0.2)

## 2020-12-11 LAB — BASIC METABOLIC PANEL
Anion gap: 8 (ref 5–15)
BUN: 18 mg/dL (ref 8–23)
CO2: 24 mmol/L (ref 22–32)
Calcium: 8.7 mg/dL — ABNORMAL LOW (ref 8.9–10.3)
Chloride: 107 mmol/L (ref 98–111)
Creatinine, Ser: 1.2 mg/dL — ABNORMAL HIGH (ref 0.44–1.00)
GFR, Estimated: 45 mL/min — ABNORMAL LOW (ref >60)
Glucose, Bld: 187 mg/dL — ABNORMAL HIGH (ref 70–99)
Potassium: 4.1 mmol/L (ref 3.5–5.1)
Sodium: 139 mmol/L (ref 135–145)

## 2020-12-11 LAB — GLUCOSE, CAPILLARY
Glucose-Capillary: 95 mg/dL (ref 70–99)
Glucose-Capillary: 136 mg/dL — ABNORMAL HIGH (ref 70–99)
Glucose-Capillary: 140 mg/dL — ABNORMAL HIGH (ref 70–99)
Glucose-Capillary: 196 mg/dL — ABNORMAL HIGH (ref 70–99)

## 2020-12-11 LAB — MAGNESIUM: Magnesium: 2.2 mg/dL (ref 1.7–2.4)

## 2020-12-11 MED ORDER — SODIUM CHLORIDE 0.9 % IV SOLN: INTRAVENOUS | Status: DC

## 2020-12-11 NOTE — Progress Notes (Signed)
Mobility Specialist - Progress Note   12/11/20 1000  Mobility  Activity Transferred:  Bed to chair  Level of Assistance Modified independent, requires aide device or extra time  Assistive Device Front wheel walker  Distance Ambulated (ft) 3 ft  Mobility Out of bed to chair with meals  Mobility Response Tolerated well  Mobility performed by Mobility specialist  $Mobility charge 1 Mobility    Pt transferred bed-chair without physical assist. O2 bumped up to 4L for transfer with sats maintaining > 90%. Returned to 2L once seated. Needs in reach.    Filiberto Pinks Mobility Specialist 12/11/20, 10:06 AM

## 2020-12-11 NOTE — Progress Notes (Signed)
PROGRESS NOTE    Sierra NajjarRena M Leja  ZOX:[id number] DOB: 1940/10/26 DOA: 12/04/2020 PCP: System, Provider Not In   Brief Narrative:  Sierra Guzman is a 80 y.o. 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  Patient admitted for acute on chronic hypoxic respiratory failure in setting of ILD flare with profound ambulatory dysfunction hypoxia and dyspnea with exertion.  Assessment & Plan:  Acute on chronic hypoxic respiratory failure likely due to exacerbation of interstitial lung disease/interstitial pulmonary fibrosis. -Continue nebs, steroids, supportive care, hold antibiotics at this time -Continue respiratory therapy, incentive spirometry, flutter -Early ambulation -Continue to wean oxygen back to home baseline of 2 L at rest and 5 L with exertion -We will likely need close follow-up in the outpatient setting with pulmonology  -Agreed for SNF placement TTE shows LVEF 6065%, grade 1 diastolic dysfunction.  No any other significant findings.    Anemia of chronic disease,and iron deficiency.  Hemoglobin 7.6, Denied any GI bleeding.  FOBT negative Patient several years ago in the past Iron deficiency, started IV Venofer 300 milligrams daily for 3 days Folic acid within normal range, B12 elevated, patient does not need B12 supplement at this time 9/3 Hb 8.0, improved  Vitamin D insufficiency, V.D level 28, started vitamin D supplement.   Acute kidney injury superimposed on stage IIIb chronic kidney disease, resolving. Advised to increase oral hydration Continue IV fluid in the meantime Creatinine 1.2  remained stable, but slightly elevated   Essential hypertension. - We will place the patient on as needed IV labetalol  -Holding home HCTZ and ARB and ACE - unclear why patient is on both ARB and ACE inhibitor, will need to follow-up with PCP.  Type II diabetes mellitus, uncontrolled with hyperglycemia. - We will place the patient on supplement coverage with NovoLog and hold off metformin at this time. -A1c 8.1 -lengthy discussion about dietary compliance  Dyslipidemia. - Statin therapy and fish oil continued.  GERD. - PPI therapy will be resumed.    DVT prophylaxis: Lovenox. Code Status: full code. Family Communication:  The plan of care was discussed in details with the patient and family at bedside  Status is: Inpatient  Dispo: The patient is from: Home              Anticipated d/c is to: SNF              Anticipated d/c date is: when bed is available               Patient currently stable for discharge  Consultants:  None  Procedures:  None  Antimicrobials:  None  Subjective: No significant overnight events, patient still has dyspnea on exertion but feels improvement, denied any chest palpitation, no any other active issues. Patient is still willing to go to rehab  Objective: Vitals:   12/10/20 2148 12/11/20 0527 12/11/20 0717 12/11/20 0748  BP: 108/63 110/60  (!) 110/57  Pulse: 80   68  Resp: 20 18  19   Temp: 98.2 F (36.8 C) 98.2 F (36.8 C)  98.8 F (37.1 C)  TempSrc:    Oral  SpO2: 92%  98% 100%  Weight:      Height:        Intake/Output Summary (Last 24 hours) at 12/11/2020 1357 Last data filed at 12/11/2020 0502 Gross per 24 hour  Intake 887.14 ml  Output 600 ml  Net 287.14 ml   Filed Weights   12/05/20 0540  Weight: 70 kg    Examination:  General:  Pleasantly resting in bed, No acute distress. HEENT:  Normocephalic atraumatic.  Sclerae nonicteric, noninjected.  Extraocular movements intact bilaterally. Neck:  Without mass or deformity.   Trachea is midline. Lungs: Diffuse bilateral rhonchi without overt wheeze or rales. Heart:  Regular rate and rhythm.  Without murmurs, rubs, or gallops. Abdomen:  Soft, nontender, nondistended.  Without guarding or rebound. Extremities: Without cyanosis, clubbing, edema, or obvious deformity. Vascular:  Dorsalis pedis and posterior tibial pulses palpable bilaterally. Skin:  Warm and dry, no erythema, no ulcerations.   Data Reviewed: I have personally reviewed following labs and imaging studies  CBC: Recent Labs  Lab 12/07/20 0515 12/08/20 0537 12/09/20 0213 12/10/20 0637 12/11/20 0942  WBC 11.7* 11.4* 13.2* 13.0* 12.6*  HGB 7.8* 7.6* 7.5* 7.9* 8.0*  HCT 25.2* 25.0* 24.5* 26.3* 26.2*  MCV 87.2 85.0 87.5 86.5 89.7  PLT 335 331 326 340 287   Basic Metabolic Panel: Recent Labs  Lab 12/07/20 0515 12/08/20 0537 12/09/20 0213 12/10/20 0637 12/11/20 0942  NA 134* 135 135 135 139  K 3.9 4.0 4.4 4.4 4.1  CL 100 101 105 103 107  CO2 26 28 26 28 24   GLUCOSE 136* 180* 81 108* 187*  BUN 28* 25* 27* 25* 18  CREATININE 1.15* 1.05* 1.03* 1.20* 1.20*  CALCIUM 9.0 8.7* 8.7* 9.2 8.7*  MG  --   --   --   --  2.2  PHOS  --   --   --   --  3.2   GFR: Estimated Creatinine Clearance: 36.1 mL/min (A) (by C-G formula based on SCr of 1.2 mg/dL (H)). Liver Function Tests: Recent Labs  Lab 12/04/20 1920  AST 24  ALT 13  ALKPHOS 55  BILITOT 0.3  PROT 6.8  ALBUMIN 2.9*   Recent Labs  Lab 12/04/20 2330  LIPASE 28   No results for input(s): AMMONIA in the last 168 hours. Coagulation Profile: Recent Labs  Lab 12/04/20 2330  INR 1.1   Cardiac Enzymes: No results for input(s): CKTOTAL, CKMB, CKMBINDEX, TROPONINI in the last 168 hours. BNP (last 3 results) No results for input(s): PROBNP in the last 8760 hours. HbA1C: No results for input(s): HGBA1C in the last 72 hours. CBG: Recent Labs  Lab 12/10/20 1143 12/10/20 1646 12/10/20 2329 12/11/20 0753 12/11/20 1135  GLUCAP  169* 116* 239* 95 136*   Lipid Profile: No results for input(s): CHOL, HDL, LDLCALC, TRIG, CHOLHDL, LDLDIRECT in the last 72 hours. Thyroid Function Tests: No results for input(s): TSH, T4TOTAL, FREET4, T3FREE, THYROIDAB in the last 72 hours. Anemia Panel: No results for input(s): VITAMINB12, FOLATE, FERRITIN, TIBC, IRON, RETICCTPCT in the last 72 hours.  Sepsis Labs: Recent Labs  Lab 12/04/20 2330  LATICACIDVEN 1.5    Recent Results (from the past 240 hour(s))  Blood culture (routine single)     Status: None   Collection Time: 12/04/20 11:30 PM   Specimen: BLOOD  Result Value Ref Range Status   Specimen Description BLOOD BLOOD LEFT HAND  Final   Special Requests   Final    BOTTLES DRAWN AEROBIC AND ANAEROBIC Blood Culture adequate volume  Culture   Final    NO GROWTH 5 DAYS Performed at [facility], [address] [place] [place], [place] [address]    Report Status 12/10/2020 FINAL  Final  Urine Culture     Status: None   Collection Time: 12/04/20 11:30 PM   Specimen: In/Out Cath Urine  Result Value Ref Range Status   Specimen Description   Final    IN/OUT CATH URINE Performed at [facility], [address] [place], [place] [address]    Special Requests   Final    NONE Performed at [facility], [address], [place], [place] [address]    Culture   Final    NO GROWTH Performed at [facility], [address] [place], [place] [address]    Report Status 12/06/2020 FINAL  Final  Resp Panel by RT-PCR (Flu A&B, Covid) Nasopharyngeal Swab     Status: None   Collection Time: 12/04/20 11:30 PM   Specimen: Nasopharyngeal Swab; Nasopharyngeal(NP) swabs in vial transport medium  Result Value Ref Range Status   SARS Coronavirus 2 by RT PCR NEGATIVE NEGATIVE Final    Comment: (NOTE) SARS-CoV-2 target nucleic acids are NOT DETECTED.  The SARS-CoV-2 RNA is generally detectable in upper respiratory specimens during the acute phase of  infection. The lowest concentration of SARS-CoV-2 viral copies this assay can detect is 138 copies/mL. A negative result does not preclude SARS-Cov-2 infection and should not be used as the sole basis for treatment or other patient management decisions. A negative result may occur with  improper specimen collection/handling, submission of specimen other than nasopharyngeal swab, presence of viral mutation(s) within the areas targeted by this assay, and inadequate number of viral copies(<138 copies/mL). A negative result must be combined with clinical observations, patient history, and epidemiological information. The expected result is Negative.  Fact Sheet for Patients:  BloggerCourse.com  Fact Sheet for Healthcare Providers:  SeriousBroker.it  This test is no t yet approved or cleared by the [place] FDA and  has been authorized for detection and/or diagnosis of SARS-CoV-2 by FDA under an Emergency Use Authorization (EUA). This EUA will remain  in effect (meaning this test can be used) for the duration of the COVID-19 declaration under Section 564(b)(1) of the Act, 21 U.S.C.section 360bbb-3(b)(1), unless the authorization is terminated  or revoked sooner.       Influenza A by PCR NEGATIVE NEGATIVE Final   Influenza B by PCR NEGATIVE NEGATIVE Final    Comment: (NOTE) The Xpert Xpress SARS-CoV-2/FLU/RSV plus assay is intended as an aid in the diagnosis of influenza from Nasopharyngeal swab specimens and should not be used as a sole basis for treatment. Nasal washings and aspirates are unacceptable for Xpert Xpress SARS-CoV-2/FLU/RSV testing.  Fact Sheet for Patients: BloggerCourse.com  Fact Sheet for Healthcare Providers: SeriousBroker.it  This test is not yet approved or cleared by the [place] FDA and has been authorized for detection and/or diagnosis of SARS-CoV-2  by FDA under an Emergency Use Authorization (EUA). This EUA will remain in effect (meaning this test can be used) for the duration of the COVID-19 declaration under Section 564(b)(1) of the Act, 21 U.S.C. section 360bbb-3(b)(1), unless the authorization is terminated or revoked.  Performed at [facility], [address], [place], [place] [address]      Radiology Studies: No results found.   Scheduled Meds:  docusate sodium  100 mg Oral Daily   enoxaparin (LOVENOX) injection  40 mg Subcutaneous Q24H  insulin aspart  0-20 Units Subcutaneous TID AC & HS   insulin aspart  3 Units Subcutaneous TID WC   irbesartan  300 mg Oral Daily   loratadine  10 mg Oral Daily   mometasone-formoterol  2 puff Inhalation BID   Pirfenidone  2 tablet Oral TID   polyethylene glycol  17 g Oral Daily   rosuvastatin  40 mg Oral Daily   umeclidinium bromide  1 puff Inhalation Daily   Vitamin D (Ergocalciferol)  50,000 Units Oral Q7 days     LOS: 6 days   Time spent:  Gillis Santa, DO [facility]  If 7PM-7AM, please contact night-coverage [email or web address]  12/11/2020, 1:57 PM

## 2020-12-11 NOTE — TOC Progression Note (Signed)
Transition of Care [facility]) - Progression Note    Patient Details  Name: Sierra Guzman MRN: [id number] Date of Birth: 1940-06-04  Transition of Care [facility]) CM/SW Contact  Liliana Cline, LCSW Phone Number: 12/11/2020, 10:33 AM  Clinical Narrative:   Spoke to daughters [name] and [place]. Informed them that [facility] rescinded bed offer. Provided list of other bed offers. They stated they will call back in a few hours with a decision. New SNF will have to get insurance auth.    Expected Discharge Plan: Home w Home Health Services Barriers to Discharge: Continued Medical Work up  Expected Discharge Plan and Services Expected Discharge Plan: Home w Home Health Services   Discharge Planning Services: CM Consult   Living arrangements for the past 2 months: Single Family Home                           HH Arranged: RN, PT, OT [facility] Agency: [facility] Date [facility] Agency Contacted: 12/07/20   Representative spoke with at [facility] Agency: [place]   Social Determinants of Health (SDOH) Interventions    Readmission Risk Interventions No flowsheet data found.

## 2020-12-12 DIAGNOSIS — J9601 Acute respiratory failure with hypoxia: Secondary | ICD-10-CM

## 2020-12-12 LAB — GLUCOSE, CAPILLARY
Glucose-Capillary: 123 mg/dL — ABNORMAL HIGH (ref 70–99)
Glucose-Capillary: 136 mg/dL — ABNORMAL HIGH (ref 70–99)
Glucose-Capillary: 152 mg/dL — ABNORMAL HIGH (ref 70–99)
Glucose-Capillary: 214 mg/dL — ABNORMAL HIGH (ref 70–99)

## 2020-12-12 MED ORDER — HYDROCORTISONE 1 % EX CREA
TOPICAL_CREAM | Freq: Two times a day (BID) | CUTANEOUS | Status: DC
  Administered 2020-12-13: 1 via TOPICAL
  Filled 2020-12-12 (×2): qty 28

## 2020-12-12 MED ORDER — HYDROXYZINE HCL 25 MG PO TABS: 25.0000 mg | ORAL_TABLET | Freq: Three times a day (TID) | ORAL | Status: DC | PRN

## 2020-12-12 NOTE — TOC Progression Note (Addendum)
Transition of Care [facility]) - Progression Note    Patient Details  Name: Sierra Guzman MRN: [id number] Date of Birth: May 17, 1940  Transition of Care [facility]) CM/SW Contact  Liliana Cline, LCSW Phone Number: 12/12/2020, 8:34 AM  Clinical Narrative:      Call from patient's daughters who reported they do not want any of the other SNF options. Explained that patient is medically ready so they do need to choose a SNF today so that auth can be started. They asked if patient can go to a SNF in [place] or [place], [place]. Explained they would need to send a list of specific SNFs and CSW can fax patient's info to those. They agreed to get a list of these SNFs today. They are willing to transport patient to a SNF in that area if she is approved and if EMS will not.   1:40- Reached out to SNFs on list sent by daughter from Medicare.gov website. Faxed referrals. See below.. Left VM and provided CSW's # as well as weekday [facility] # for return calls.  Updated daughter. Informed her that it would be helpful for family to reach out to SNFs as well about availability. Informed her since patient is medically ready that if these SNFs cannot take patient or do not reply, they would have to choose a current bed offer and could work with the SW there on possibly getting patient transferred to another SNF if they would like. Provided RNCM [place] number for follow up tomorrow if needed.   --[facility] [phone])- Left VM. Faxed referral to [phone]. --[facility] [phone])- Left VM for Delice Bison. Faxed referral to [phone].  --[facility] [phone])- left VM. Faxed referral to [phone].  --[facility] [phone])- Left VM for Christina. Faxed referral to [phone].  --[facility]- per Medicare website it looks like this is inpatient rehab which is different than SNF, notified  daughter.     Expected Discharge Plan: Home w Home Health Services Barriers to Discharge: Continued Medical Work up  Expected Discharge Plan and Services Expected Discharge Plan: Home w Home Health Services   Discharge Planning Services: CM Consult   Living arrangements for the past 2 months: Single Family Home                           HH Arranged: RN, PT, OT [facility] Agency: [facility] Date [facility] Agency Contacted: 12/07/20   Representative spoke with at [facility] Agency: [place]   Social Determinants of Health (SDOH) Interventions    Readmission Risk Interventions No flowsheet data found.

## 2020-12-12 NOTE — Progress Notes (Signed)
Follow up visit with patient to provide presence and support which is mutually enjoyable each visit. Patient is waiting for her daughter to visit and to take her to the lobby to see a younger granddaughter who is under age to be admitted in. Patient expresses excitement for this to happen later today.

## 2020-12-12 NOTE — Progress Notes (Signed)
PROGRESS NOTE    Sierra Guzman  CLE:[id number] DOB: 04-29-1940 DOA: 12/04/2020 PCP: System, Provider Not In   Brief Narrative:  Sierra Guzman is a 80 y.o. 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  Patient admitted for acute on chronic hypoxic respiratory failure in setting of ILD flare with profound ambulatory dysfunction hypoxia and dyspnea with exertion.  Assessment & Plan:  Acute on chronic hypoxic respiratory failure likely due to exacerbation of interstitial lung disease/interstitial pulmonary fibrosis. -Continue nebs, steroids, supportive care, hold antibiotics at this time -Continue respiratory therapy, incentive spirometry, flutter -Early ambulation -Continue to wean oxygen back to home baseline of 2 L at rest and 5 L with exertion -We will likely need close follow-up in the outpatient setting with pulmonology  -Agreed for SNF placement TTE shows LVEF 6065%, grade 1 diastolic dysfunction.  No any other significant findings.    Anemia of chronic disease,and iron deficiency.  Hemoglobin 7.6, Denied any GI bleeding.  FOBT negative Patient several years ago in the past Iron deficiency, started IV Venofer 300 milligrams daily for 3 days Folic acid within normal range, B12 elevated, patient does not need B12 supplement at this time 9/3 Hb 8.0, improved  Vitamin D insufficiency, V.D level 28, started vitamin D supplement.   Acute kidney injury superimposed on stage IIIb chronic kidney disease, resolving. Advised to increase oral hydration Continue IV fluid in the meantime Creatinine 1.2  remained stable, but slightly elevated   Essential hypertension. - We will place the patient on as needed IV labetalol  -Holding home HCTZ and ARB and ACE - unclear why patient is on both ARB and ACE inhibitor, will need to follow-up with PCP.  Type II diabetes mellitus, uncontrolled with hyperglycemia. - We will place the patient on supplement coverage with NovoLog and hold off metformin at this time. -A1c 8.1 -lengthy discussion about dietary compliance  Dyslipidemia. - Statin therapy and fish oil continued.  GERD. - PPI therapy will be resumed.    DVT prophylaxis: Lovenox. Code Status: full code. Family Communication:  The plan of care was discussed in details with the patient and family at bedside  Status is: Inpatient  Dispo: The patient is from: Home              Anticipated d/c is to: SNF              Anticipated d/c date is: when bed is available               Patient currently stable for discharge  Consultants:  None  Procedures:  None  Antimicrobials:  None  Subjective: No significant overnight events, patient's shortness of breath and physical activity is getting better while walking with PT.  Patient denies any worsening of symptoms.  Denied any chest palpitation.  Patient was complaining of itching in the right lower extremity in the calf area and after scratching she had a repeat bleed.  No any visible findings on exam. Patient was started on Atarax as needed and hydrocortisone topical cream for itching   Objective: Vitals:   12/11/20 1632 12/11/20 2010 12/12/20 0439 12/12/20 0805  BP: Marland Kitchen)  117/58 (!) 111/58 (!) 108/58 (!) 111/52  Pulse: 82 (!) 103 80 68  Resp: 20 20 18 20   Temp: 98.8 F (37.1 C) 98.9 F (37.2 C) 98.1 F (36.7 C) 98.5 F (36.9 C)  TempSrc: Oral  Oral Oral  SpO2: 99% 92% 96% 100%  Weight:      Height:        Intake/Output Summary (Last 24 hours) at 12/12/2020 1337 Last data filed at 12/12/2020 0425 Gross per 24 hour  Intake  1171.53 ml  Output 600 ml  Net 571.53 ml   Filed Weights   12/05/20 0540  Weight: 70 kg    Examination:  General:  Pleasantly resting in bed, No acute distress. HEENT:  Normocephalic atraumatic.  Sclerae nonicteric, noninjected.  Extraocular movements intact bilaterally. Neck:  Without mass or deformity.  Trachea is midline. Lungs: Diffuse bilateral rhonchi without overt wheeze or rales. Heart:  Regular rate and rhythm.  Without murmurs, rubs, or gallops. Abdomen:  Soft, nontender, nondistended.  Without guarding or rebound. Extremities: Without cyanosis, clubbing, edema, or obvious deformity. Vascular:  Dorsalis pedis and posterior tibial pulses palpable bilaterally. Skin:  Warm and dry, no erythema, no ulcerations.   Data Reviewed: I have personally reviewed following labs and imaging studies  CBC: Recent Labs  Lab 12/07/20 0515 12/08/20 0537 12/09/20 0213 12/10/20 0637 12/11/20 0942  WBC 11.7* 11.4* 13.2* 13.0* 12.6*  HGB 7.8* 7.6* 7.5* 7.9* 8.0*  HCT 25.2* 25.0* 24.5* 26.3* 26.2*  MCV 87.2 85.0 87.5 86.5 89.7  PLT 335 331 326 340 287   Basic Metabolic Panel: Recent Labs  Lab 12/07/20 0515 12/08/20 0537 12/09/20 0213 12/10/20 0637 12/11/20 0942  NA 134* 135 135 135 139  K 3.9 4.0 4.4 4.4 4.1  CL 100 101 105 103 107  CO2 26 28 26 28 24   GLUCOSE 136* 180* 81 108* 187*  BUN 28* 25* 27* 25* 18  CREATININE 1.15* 1.05* 1.03* 1.20* 1.20*  CALCIUM 9.0 8.7* 8.7* 9.2 8.7*  MG  --   --   --   --  2.2  PHOS  --   --   --   --  3.2   GFR: Estimated Creatinine Clearance: 36.1 mL/min (A) (by C-G formula based on SCr of 1.2 mg/dL (H)). Liver Function Tests: No results for input(s): AST, ALT, ALKPHOS, BILITOT, PROT, ALBUMIN in the last 168 hours.  No results for input(s): LIPASE, AMYLASE in the last 168 hours.  No results for input(s): AMMONIA in the last 168 hours. Coagulation Profile: No results for input(s): INR, PROTIME in the last 168 hours.  Cardiac  Enzymes: No results for input(s): CKTOTAL, CKMB, CKMBINDEX, TROPONINI in the last 168 hours. BNP (last 3 results) No results for input(s): PROBNP in the last 8760 hours. HbA1C: No results for input(s): HGBA1C in the last 72 hours. CBG: Recent Labs  Lab 12/11/20 1135 12/11/20 1635 12/11/20 2042 12/12/20 0807 12/12/20 1149  GLUCAP 136* 140* 196* 123* 136*   Lipid Profile: No results for input(s): CHOL, HDL, LDLCALC, TRIG, CHOLHDL, LDLDIRECT in the last 72 hours. Thyroid Function Tests: No results for input(s): TSH, T4TOTAL, FREET4, T3FREE, THYROIDAB in the last 72 hours. Anemia Panel: No results for input(s): VITAMINB12, FOLATE, FERRITIN, TIBC, IRON, RETICCTPCT in the last 72 hours.  Sepsis Labs: No results for input(s): PROCALCITON, LATICACIDVEN in the last 168 hours.   Recent Results (from the past 240 hour(s))  Blood culture (routine single)     Status: None   Collection Time:  12/04/20 11:30 PM   Specimen: BLOOD  Result Value Ref Range Status   Specimen Description BLOOD BLOOD LEFT HAND  Final   Special Requests   Final    BOTTLES DRAWN AEROBIC AND ANAEROBIC Blood Culture adequate volume   Culture   Final    NO GROWTH 5 DAYS Performed at [facility], [address] [place], [place] [address]    Report Status 12/10/2020 FINAL  Final  Urine Culture     Status: None   Collection Time: 12/04/20 11:30 PM   Specimen: In/Out Cath Urine  Result Value Ref Range Status   Specimen Description   Final    IN/OUT CATH URINE Performed at [facility], [address] [place], [place] [address]    Special Requests   Final    NONE Performed at [facility], [address], [place], [place] [address]    Culture   Final    NO GROWTH Performed at [facility], [address], [place], [place] [address]    Report Status 12/06/2020 FINAL  Final  Resp Panel by RT-PCR (Flu A&B, Covid) Nasopharyngeal Swab     Status: None   Collection Time:  12/04/20 11:30 PM   Specimen: Nasopharyngeal Swab; Nasopharyngeal(NP) swabs in vial transport medium  Result Value Ref Range Status   SARS Coronavirus 2 by RT PCR NEGATIVE NEGATIVE Final    Comment: (NOTE) SARS-CoV-2 target nucleic acids are NOT DETECTED.  The SARS-CoV-2 RNA is generally detectable in upper respiratory specimens during the acute phase of infection. The lowest concentration of SARS-CoV-2 viral copies this assay can detect is 138 copies/mL. A negative result does not preclude SARS-Cov-2 infection and should not be used as the sole basis for treatment or other patient management decisions. A negative result may occur with  improper specimen collection/handling, submission of specimen other than nasopharyngeal swab, presence of viral mutation(s) within the areas targeted by this assay, and inadequate number of viral copies(<138 copies/mL). A negative result must be combined with clinical observations, patient history, and epidemiological information. The expected result is Negative.  Fact Sheet for Patients:  BloggerCourse.com  Fact Sheet for Healthcare Providers:  SeriousBroker.it  This test is no t yet approved or cleared by the [place] FDA and  has been authorized for detection and/or diagnosis of SARS-CoV-2 by FDA under an Emergency Use Authorization (EUA). This EUA will remain  in effect (meaning this test can be used) for the duration of the COVID-19 declaration under Section 564(b)(1) of the Act, 21 U.S.C.section 360bbb-3(b)(1), unless the authorization is terminated  or revoked sooner.       Influenza A by PCR NEGATIVE NEGATIVE Final   Influenza B by PCR NEGATIVE NEGATIVE Final    Comment: (NOTE) The Xpert Xpress SARS-CoV-2/FLU/RSV plus assay is intended as an aid in the diagnosis of influenza from Nasopharyngeal swab specimens and should not be used as a sole basis for treatment. Nasal washings  and aspirates are unacceptable for Xpert Xpress SARS-CoV-2/FLU/RSV testing.  Fact Sheet for Patients: BloggerCourse.com  Fact Sheet for Healthcare Providers: SeriousBroker.it  This test is not yet approved or cleared by the [place] FDA and has been authorized for detection and/or diagnosis of SARS-CoV-2 by FDA under an Emergency Use Authorization (EUA). This EUA will remain in effect (meaning this test can be used) for the duration of the COVID-19 declaration under Section 564(b)(1) of the Act, 21 U.S.C. section 360bbb-3(b)(1), unless the authorization is terminated or revoked.  Performed  at [facility], [address] [place], [place] [address]      Radiology Studies: No results found.   Scheduled Meds:  docusate sodium  100 mg Oral Daily   enoxaparin (LOVENOX) injection  40 mg Subcutaneous Q24H   hydrocortisone cream   Topical BID   insulin aspart  0-20 Units Subcutaneous TID AC & HS   insulin aspart  3 Units Subcutaneous TID WC   irbesartan  300 mg Oral Daily   loratadine  10 mg Oral Daily   mometasone-formoterol  2 puff Inhalation BID   Pirfenidone  2 tablet Oral TID   polyethylene glycol  17 g Oral Daily   rosuvastatin  40 mg Oral Daily   umeclidinium bromide  1 puff Inhalation Daily   Vitamin D (Ergocalciferol)  50,000 Units Oral Q7 days     LOS: 7 days   Time spent: 40min  Gillis Santaileep Bergen Melle, DO [facility]  If 7PM-7AM, please contact night-coverage [email or web address]  12/12/2020, 1:37 PM

## 2020-12-13 ENCOUNTER — Inpatient Hospital Stay: Payer: Medicare HMO

## 2020-12-13 LAB — PHOSPHORUS: Phosphorus: 2 mg/dL — ABNORMAL LOW (ref 2.5–4.6)

## 2020-12-13 LAB — CBC
HCT: 26.9 % — ABNORMAL LOW (ref 36.0–46.0)
Hemoglobin: 8.2 g/dL — ABNORMAL LOW (ref 12.0–15.0)
MCH: 27.8 pg (ref 26.0–34.0)
MCHC: 30.5 g/dL (ref 30.0–36.0)
MCV: 91.2 fL (ref 80.0–100.0)
Platelets: 224 10*3/uL (ref 150–400)
RBC: 2.95 MIL/uL — ABNORMAL LOW (ref 3.87–5.11)
RDW: 19.2 % — ABNORMAL HIGH (ref 11.5–15.5)
WBC: 11.3 10*3/uL — ABNORMAL HIGH (ref 4.0–10.5)
nRBC: 0 % (ref 0.0–0.2)

## 2020-12-13 LAB — BASIC METABOLIC PANEL
Anion gap: 4 — ABNORMAL LOW (ref 5–15)
BUN: 11 mg/dL (ref 8–23)
CO2: 24 mmol/L (ref 22–32)
Calcium: 8.8 mg/dL — ABNORMAL LOW (ref 8.9–10.3)
Chloride: 109 mmol/L (ref 98–111)
Creatinine, Ser: 1.09 mg/dL — ABNORMAL HIGH (ref 0.44–1.00)
GFR, Estimated: 51 mL/min — ABNORMAL LOW (ref >60)
Glucose, Bld: 164 mg/dL — ABNORMAL HIGH (ref 70–99)
Potassium: 3.6 mmol/L (ref 3.5–5.1)
Sodium: 137 mmol/L (ref 135–145)

## 2020-12-13 LAB — GLUCOSE, CAPILLARY
Glucose-Capillary: 100 mg/dL — ABNORMAL HIGH (ref 70–99)
Glucose-Capillary: 145 mg/dL — ABNORMAL HIGH (ref 70–99)
Glucose-Capillary: 160 mg/dL — ABNORMAL HIGH (ref 70–99)
Glucose-Capillary: 104 mg/dL — ABNORMAL HIGH (ref 70–99)

## 2020-12-13 LAB — MAGNESIUM: Magnesium: 1.9 mg/dL (ref 1.7–2.4)

## 2020-12-13 LAB — FERRITIN: Ferritin: 791 ng/mL — ABNORMAL HIGH (ref 11–307)

## 2020-12-13 MED ORDER — K PHOS MONO-SOD PHOS DI & MONO 155-852-130 MG PO TABS
500.0000 mg | ORAL_TABLET | Freq: Three times a day (TID) | ORAL | Status: AC
  Administered 2020-12-13 (×2): 500 mg via ORAL
  Filled 2020-12-13 (×2): qty 2

## 2020-12-13 MED ORDER — PANTOPRAZOLE SODIUM 40 MG PO TBEC
40.0000 mg | DELAYED_RELEASE_TABLET | Freq: Every day | ORAL | Status: DC
  Administered 2020-12-13 – 2020-12-19 (×7): 40 mg via ORAL
  Filled 2020-12-13 (×7): qty 1

## 2020-12-13 MED ORDER — FUROSEMIDE 10 MG/ML IJ SOLN: 40.0000 mg | Freq: Once | INTRAMUSCULAR | Status: DC

## 2020-12-13 MED ORDER — FUROSEMIDE 10 MG/ML IJ SOLN
20.0000 mg | Freq: Once | INTRAMUSCULAR | Status: AC
  Administered 2020-12-13: 20 mg via INTRAVENOUS
  Filled 2020-12-13: qty 4

## 2020-12-13 NOTE — Progress Notes (Addendum)
PROGRESS NOTE    [name]  GYJ:[id number] DOB: 1941-02-13 DOA: 12/04/2020 PCP: System, Provider Not In   Brief Narrative: Sierra Guzman is a 80 y.o. female with history of asthma CKD stage III, ILD/pulmonary fibrosis, chronic respiratory failure on 2 L of oxygen at rest and 5 L with ambulation, CAD, diabetes mellitus type 2, gout, hyperlipidemia, GERD.  Patient presented secondary to worsening dyspnea on exertion with evidence of acute on chronic respiratory failure with hypoxia and was diagnosed with a mildly flare.  Patient was treated with supplemental oxygen in addition to steroids with improvement.  Attempting to wean patient's oxygen prior to discharge.   Assessment & Plan:   Principal Problem:   Acute on chronic respiratory failure with hypoxemia (HCC) Active Problems:   Hyperlipidemia with target LDL less than 100   Hypertension   Stage 3b chronic kidney disease (HCC)   Acute respiratory failure with hypoxia (HCC)   Acute on chronic respiratory failure with hypoxia Secondary to below.  Patient chronically uses 2 L at rest and 5 L with ambulation.  Currently requiring up to 6 L with ambulation.  PT/OT recommend SNF for discharge. -Wean to baseline oxygen use -Continue ambulation/mobility -Two-view chest x-ray  Addendum: chest x-ray with edema; likely in setting of IV fluids. Lasix IV 20 mg x1 since naive. Increase as needed/tolerated  Exacerbation of ILD Patient initially treated with Solu-Medrol 40 mg IV every 8 hours for 1 day and transition to prednisone 40 mg for an additional 4 days.  No concern for pneumonia at this time. -Continue Dulera, Incruse Ellipta, DuoNeb as needed  CKD stage IIIb Baseline creatinine of 1.2.  Does not meet criteria for AKI.  Stable  Anemia Chronic. Patient has required transfusion therapy, IV iron/oral iron therapies.  Also in the setting of his chronic kidney disease.  Currently not taking iron as she was asked to discontinue oral  iron. Iron/TIBC/iron saturations suggests possibly iron deficiency anemia. -Check ferritin; if ferritin suggests iron deficiency, depending on level would give IV iron vs resuming PO iron  Primary hypertension Patient is on candesartan and metoprolol succinate 50 mg daily which is not resumed on admission.  Blood pressure is currently stable. -Will continue irbesartan (substituted for candesartan)  Diabetes mellitus, type 2 Steroid induced diabetes Current Hemoglobin A1c of 8.1%.  Patient's hemoglobin A1c of 5.4% in February 2022.  Patient reports this is secondary to steroids.  Medication as an outpatient.  Currently receiving sliding-scale insulin in addition to NovoLog 3 units with meals.  Hyperlipidemia Patient is on Crestor as an outpatient. -Continue Crestor 40 mg daily  GERD Patient is on omeprazole as an outpatient currently asymptomatic.  Gout Patient is on allopurinol as an outpatient which is not resumed on admission.  Patient is on multiple doses.   35 minutes face-to-face; counseling/coordination of care > 50 percent of visit.   DVT prophylaxis: Lovenox Code Status:   Code Status: Full Code Family Communication: None at bedside Disposition Plan: Discharge to SNF likely in 1 to 3 days pending ability to wean to home oxygen   Consultants:  None  Procedures:  TRANSTHORACIC ECHOCARDIOGRAM (12/09/2020) IMPRESSIONS     1. Left ventricular ejection fraction, by estimation, is 60 to 65%. The  left ventricle has normal function. The left ventricle has no regional  wall motion abnormalities. Left ventricular diastolic parameters are  consistent with Grade I diastolic  dysfunction (impaired relaxation). The average left ventricular global  longitudinal strain is -16.6 %. The  global longitudinal strain is normal.   2. Right ventricular systolic function is normal. The right ventricular  size is normal.   3. The aortic valve is normal in structure. Aortic valve  regurgitation is  not visualized. Mild to moderate aortic valve sclerosis/calcification is  present, without any evidence of aortic stenosis.   Antimicrobials: None   Subjective: Patient reports frustration that she is not been up and ambulating.  She feels like she needs to be ambulating better.  No significant dyspnea or chest pain.  Objective: Vitals:   12/12/20 1641 12/12/20 2000 12/13/20 0500 12/13/20 0743  BP: 129/73 126/69 126/72 (!) 118/47  Pulse: 90  86 65  Resp: 20 18 18 18   Temp: 98.5 F (36.9 C) 98.5 F (36.9 C) 98.7 F (37.1 C) 98.5 F (36.9 C)  TempSrc: Oral  Oral Oral  SpO2: 100%  98% 98%  Weight:      Height:        Intake/Output Summary (Last 24 hours) at 12/13/2020 1023 Last data filed at 12/13/2020 1016 Gross per 24 hour  Intake 2888.36 ml  Output 800 ml  Net 2088.36 ml   Filed Weights   12/05/20 0540  Weight: 70 kg    Examination:  General exam: Appears calm and comfortable Respiratory system: bilateral lower/mid level rales. No wheezing or rhonchi. Respiratory effort normal. Cardiovascular system: Prominent S1 & S2 heard, regular rhythm with normal rate. Harsh 2/6 systolic murmur. Gastrointestinal system: Abdomen is nondistended, soft and nontender. No organomegaly or masses felt. Normal bowel sounds heard. Central nervous system: Alert and oriented. No focal neurological deficits. Musculoskeletal: No edema. No calf tenderness Skin: No cyanosis. No rashes Psychiatry: Judgement and insight appear normal. Mood & affect appropriate.     Data Reviewed: I have personally reviewed following labs and imaging studies  CBC Lab Results  Component Value Date   WBC 12.6 (H) 12/11/2020   RBC 2.92 (L) 12/11/2020   HGB 8.0 (L) 12/11/2020   HCT 26.2 (L) 12/11/2020   MCV 89.7 12/11/2020   MCH 27.4 12/11/2020   PLT 287 12/11/2020   MCHC 30.5 12/11/2020   RDW 17.8 (H) 12/11/2020   LYMPHSABS 3.6 12/21/2016   MONOABS 0.7 12/21/2016   EOSABS 0.2  12/21/2016   BASOSABS 0.1 12/21/2016     Last metabolic panel Lab Results  Component Value Date   NA 139 12/11/2020   K 4.1 12/11/2020   CL 107 12/11/2020   CO2 24 12/11/2020   BUN 18 12/11/2020   CREATININE 1.20 (H) 12/11/2020   GLUCOSE 187 (H) 12/11/2020   GFRNONAA 45 (L) 12/11/2020   GFRAA 54 (L) 12/21/2016   CALCIUM 8.7 (L) 12/11/2020   PHOS 3.2 12/11/2020   PROT 6.8 12/04/2020   ALBUMIN 2.9 (L) 12/04/2020   BILITOT 0.3 12/04/2020   ALKPHOS 55 12/04/2020   AST 24 12/04/2020   ALT 13 12/04/2020   ANIONGAP 8 12/11/2020    CBG (last 3)  Recent Labs    12/12/20 1643 12/12/20 2021 12/13/20 0741  GLUCAP 152* 214* 100*     GFR: Estimated Creatinine Clearance: 36.1 mL/min (A) (by C-G formula based on SCr of 1.2 mg/dL (H)).  Coagulation Profile: No results for input(s): INR, PROTIME in the last 168 hours.  Recent Results (from the past 240 hour(s))  Blood culture (routine single)     Status: None   Collection Time: 12/04/20 11:30 PM   Specimen: BLOOD  Result Value Ref Range Status   Specimen Description BLOOD BLOOD LEFT  HAND  Final   Special Requests   Final    BOTTLES DRAWN AEROBIC AND ANAEROBIC Blood Culture adequate volume   Culture   Final    NO GROWTH 5 DAYS Performed at [facility], [address] [place], [place] [address]    Report Status 12/10/2020 FINAL  Final  Urine Culture     Status: None   Collection Time: 12/04/20 11:30 PM   Specimen: In/Out Cath Urine  Result Value Ref Range Status   Specimen Description   Final    IN/OUT CATH URINE Performed at [facility], [address], [place], [place] [address]    Special Requests   Final    NONE Performed at [facility], [address], [place], [place] [address]    Culture   Final    NO GROWTH Performed at [facility], [address], [place], [place] [address]    Report Status 12/06/2020 FINAL  Final  Resp Panel by RT-PCR (Flu A&B, Covid)  Nasopharyngeal Swab     Status: None   Collection Time: 12/04/20 11:30 PM   Specimen: Nasopharyngeal Swab; Nasopharyngeal(NP) swabs in vial transport medium  Result Value Ref Range Status   SARS Coronavirus 2 by RT PCR NEGATIVE NEGATIVE Final    Comment: (NOTE) SARS-CoV-2 target nucleic acids are NOT DETECTED.  The SARS-CoV-2 RNA is generally detectable in upper respiratory specimens during the acute phase of infection. The lowest concentration of SARS-CoV-2 viral copies this assay can detect is 138 copies/mL. A negative result does not preclude SARS-Cov-2 infection and should not be used as the sole basis for treatment or other patient management decisions. A negative result may occur with  improper specimen collection/handling, submission of specimen other than nasopharyngeal swab, presence of viral mutation(s) within the areas targeted by this assay, and inadequate number of viral copies(<138 copies/mL). A negative result must be combined with clinical observations, patient history, and epidemiological information. The expected result is Negative.  Fact Sheet for Patients:  BloggerCourse.com  Fact Sheet for Healthcare Providers:  SeriousBroker.it  This test is no t yet approved or cleared by the [place] FDA and  has been authorized for detection and/or diagnosis of SARS-CoV-2 by FDA under an Emergency Use Authorization (EUA). This EUA will remain  in effect (meaning this test can be used) for the duration of the COVID-19 declaration under Section 564(b)(1) of the Act, 21 U.S.C.section 360bbb-3(b)(1), unless the authorization is terminated  or revoked sooner.       Influenza A by PCR NEGATIVE NEGATIVE Final   Influenza B by PCR NEGATIVE NEGATIVE Final    Comment: (NOTE) The Xpert Xpress SARS-CoV-2/FLU/RSV plus assay is intended as an aid in the diagnosis of influenza from Nasopharyngeal swab specimens and should not be  used as a sole basis for treatment. Nasal washings and aspirates are unacceptable for Xpert Xpress SARS-CoV-2/FLU/RSV testing.  Fact Sheet for Patients: BloggerCourse.com  Fact Sheet for Healthcare Providers: SeriousBroker.it  This test is not yet approved or cleared by the [place] FDA and has been authorized for detection and/or diagnosis of SARS-CoV-2 by FDA under an Emergency Use Authorization (EUA). This EUA will remain in effect (meaning this test can be used) for the duration of the COVID-19 declaration under Section 564(b)(1) of the Act, 21 U.S.C. section 360bbb-3(b)(1), unless the authorization is terminated or revoked.  Performed at [facility], [address] [place], [place] [address]         Radiology  Studies: No results found.      Scheduled Meds:  docusate sodium  100 mg Oral Daily   enoxaparin (LOVENOX) injection  40 mg Subcutaneous Q24H   hydrocortisone cream   Topical BID   insulin aspart  0-20 Units Subcutaneous TID AC & HS   insulin aspart  3 Units Subcutaneous TID WC   irbesartan  300 mg Oral Daily   loratadine  10 mg Oral Daily   mometasone-formoterol  2 puff Inhalation BID   Pirfenidone  2 tablet Oral TID   polyethylene glycol  17 g Oral Daily   rosuvastatin  40 mg Oral Daily   umeclidinium bromide  1 puff Inhalation Daily   Vitamin D (Ergocalciferol)  50,000 Units Oral Q7 days   Continuous Infusions:   LOS: 8 days     Jacquelin Hawkingalph Alicea Wente, MD [facility] 12/13/2020, 10:23 AM  If 7PM-7AM, please contact night-coverage [email or web address]

## 2020-12-14 DIAGNOSIS — E785 Hyperlipidemia, unspecified: Secondary | ICD-10-CM

## 2020-12-14 DIAGNOSIS — N1832 Chronic kidney disease, stage 3b: Secondary | ICD-10-CM

## 2020-12-14 LAB — RENAL FUNCTION PANEL
Albumin: 2.4 g/dL — ABNORMAL LOW (ref 3.5–5.0)
Anion gap: 4 — ABNORMAL LOW (ref 5–15)
BUN: 9 mg/dL (ref 8–23)
CO2: 27 mmol/L (ref 22–32)
Calcium: 8.6 mg/dL — ABNORMAL LOW (ref 8.9–10.3)
Chloride: 108 mmol/L (ref 98–111)
Creatinine, Ser: 1.2 mg/dL — ABNORMAL HIGH (ref 0.44–1.00)
GFR, Estimated: 45 mL/min — ABNORMAL LOW (ref >60)
Glucose, Bld: 109 mg/dL — ABNORMAL HIGH (ref 70–99)
Phosphorus: 4.3 mg/dL (ref 2.5–4.6)
Potassium: 4 mmol/L (ref 3.5–5.1)
Sodium: 139 mmol/L (ref 135–145)

## 2020-12-14 LAB — GLUCOSE, CAPILLARY
Glucose-Capillary: 106 mg/dL — ABNORMAL HIGH (ref 70–99)
Glucose-Capillary: 148 mg/dL — ABNORMAL HIGH (ref 70–99)
Glucose-Capillary: 138 mg/dL — ABNORMAL HIGH (ref 70–99)
Glucose-Capillary: 133 mg/dL — ABNORMAL HIGH (ref 70–99)

## 2020-12-14 NOTE — Progress Notes (Signed)
PROGRESS NOTE    Sierra NajjarRena M Guzman  ZOX:[id number] DOB: 06/18/1940 DOA: 12/04/2020 PCP: System, Provider Not In   Brief Narrative: Sierra NajjarRena M Guzman is a 80 y.o. female with history of asthma CKD stage III, ILD/pulmonary fibrosis, chronic respiratory failure on 2 L of oxygen at rest and 5 L with ambulation, CAD, diabetes mellitus type 2, gout, hyperlipidemia, GERD.  Patient presented secondary to worsening dyspnea on exertion with evidence of acute on chronic respiratory failure with hypoxia and was diagnosed with a mildly flare.  Patient was treated with supplemental oxygen in addition to steroids with improvement.  Attempting to wean patient's oxygen prior to discharge.   Assessment & Plan:   Principal Problem:   Acute on chronic respiratory failure with hypoxemia (HCC) Active Problems:   Hyperlipidemia with target LDL less than 100   Hypertension   Stage 3b chronic kidney disease (HCC)   Acute respiratory failure with hypoxia (HCC)   Acute on chronic respiratory failure with hypoxia Secondary to below.  Patient chronically uses 2 L at rest and 5 L with ambulation.  Currently requiring up to 6 L with ambulation.  PT/OT recommend SNF for discharge. Some edema on chest x-ray (9/5) and patient treated with Lasix 20 mg IV. Weaned to 2 L at rest this morning and Patient remained with stable SpO2. -Continue oxygen therapy at 2 L/min at rest -Ambulatory pulse oximetry daily  Exacerbation of ILD Patient initially treated with Solu-Medrol 40 mg IV every 8 hours for 1 day and transition to prednisone 40 mg for an additional 4 days.  No concern for pneumonia at this time. -Continue Dulera, Incruse Ellipta, DuoNeb as needed  CKD stage IIIb Baseline creatinine of 1.2.  Does not meet criteria for AKI.  Stable  Anemia Chronic. Patient has required transfusion therapy, IV iron/oral iron therapies.  Also in the setting of his chronic kidney disease.  Currently not taking iron as she was asked to  discontinue oral iron. Iron/TIBC/iron saturations suggests possible iron deficiency anemia but with elevated ferritin, more likely from CKD.  Primary hypertension Patient is on candesartan and metoprolol succinate 50 mg daily which is not resumed on admission.  Blood pressure is currently stable. -Continue irbesartan (substituted for candesartan)  Diabetes mellitus, type 2 Steroid induced diabetes Current Hemoglobin A1c of 8.1%.  Patient's hemoglobin A1c of 5.4% in February 2022.  Patient reports this is secondary to steroids.  Medication as an outpatient.  Currently receiving sliding-scale insulin in addition to NovoLog 3 units with meals.  Hyperlipidemia Patient is on Crestor as an outpatient. -Continue Crestor 40 mg daily  GERD Patient is on omeprazole as an outpatient currently asymptomatic.  Gout Patient is on allopurinol as an outpatient which is not resumed on admission.  Patient is on multiple doses.   35 minutes face-to-face; counseling/coordination of care > 50 percent of visit.   DVT prophylaxis: Lovenox Code Status:   Code Status: Full Code Family Communication: None at bedside. Daughters on telephone Disposition Plan: Discharge to SNF likely in 1 to 2 days pending ability to wean to home oxygen of 5 L/min with ambulation   Consultants:  None  Procedures:  TRANSTHORACIC ECHOCARDIOGRAM (12/09/2020) IMPRESSIONS     1. Left ventricular ejection fraction, by estimation, is 60 to 65%. The  left ventricle has normal function. The left ventricle has no regional  wall motion abnormalities. Left ventricular diastolic parameters are  consistent with Grade I diastolic  dysfunction (impaired relaxation). The average left ventricular global  longitudinal  strain is -16.6 %. The global longitudinal strain is normal.   2. Right ventricular systolic function is normal. The right ventricular  size is normal.   3. The aortic valve is normal in structure. Aortic valve  regurgitation is  not visualized. Mild to moderate aortic valve sclerosis/calcification is  present, without any evidence of aortic stenosis.   Antimicrobials: None   Subjective: Urinated well overnight. No issues. Ambulated yesterday. Ambulated well but did have some hypoxia that resolved with rest.  Objective: Vitals:   12/13/20 0743 12/13/20 1622 12/13/20 1948 12/14/20 0621  BP: (!) 118/47 (!) 110/55 (!) 120/58 (!) 104/51  Pulse: 65 89 89 96  Resp: 18 20 17 18   Temp: 98.5 F (36.9 C) 98.3 F (36.8 C) 98.1 F (36.7 C) 98.6 F (37 C)  TempSrc: Oral Oral Oral Oral  SpO2: 98% 100% 100%   Weight:      Height:        Intake/Output Summary (Last 24 hours) at 12/14/2020 0846 Last data filed at 12/14/2020 0133 Gross per 24 hour  Intake 1200 ml  Output 1350 ml  Net -150 ml    Filed Weights   12/05/20 0540  Weight: 70 kg    Examination:  General exam: Appears calm and comfortable Respiratory system: Diffuse rales. Respiratory effort normal. Cardiovascular system: S1 & S2 heard, RRR. No murmurs, rubs, gallops or clicks. Gastrointestinal system: Abdomen is nondistended, soft and nontender. No organomegaly or masses felt. Normal bowel sounds heard. Central nervous system: Alert and oriented. No focal neurological deficits. Musculoskeletal: Trace ankle edema. No calf tenderness Skin: No cyanosis. No rashes Psychiatry: Judgement and insight appear normal. Mood & affect appropriate.     Data Reviewed: I have personally reviewed following labs and imaging studies  CBC Lab Results  Component Value Date   WBC 11.3 (H) 12/13/2020   RBC 2.95 (L) 12/13/2020   HGB 8.2 (L) 12/13/2020   HCT 26.9 (L) 12/13/2020   MCV 91.2 12/13/2020   MCH 27.8 12/13/2020   PLT 224 12/13/2020   MCHC 30.5 12/13/2020   RDW 19.2 (H) 12/13/2020   LYMPHSABS 3.6 12/21/2016   MONOABS 0.7 12/21/2016   EOSABS 0.2 12/21/2016   BASOSABS 0.1 12/21/2016     Last metabolic panel Lab Results   Component Value Date   NA 139 12/14/2020   K 4.0 12/14/2020   CL 108 12/14/2020   CO2 27 12/14/2020   BUN 9 12/14/2020   CREATININE 1.20 (H) 12/14/2020   GLUCOSE 109 (H) 12/14/2020   GFRNONAA 45 (L) 12/14/2020   GFRAA 54 (L) 12/21/2016   CALCIUM 8.6 (L) 12/14/2020   PHOS 4.3 12/14/2020   PROT 6.8 12/04/2020   ALBUMIN 2.4 (L) 12/14/2020   BILITOT 0.3 12/04/2020   ALKPHOS 55 12/04/2020   AST 24 12/04/2020   ALT 13 12/04/2020   ANIONGAP 4 (L) 12/14/2020    CBG (last 3)  Recent Labs    12/13/20 1620 12/13/20 2110 12/14/20 0753  GLUCAP 160* 104* 106*      GFR: Estimated Creatinine Clearance: 36.1 mL/min (A) (by C-G formula based on SCr of 1.2 mg/dL (H)).  Coagulation Profile: No results for input(s): INR, PROTIME in the last 168 hours.  Recent Results (from the past 240 hour(s))  Blood culture (routine single)     Status: None   Collection Time: 12/04/20 11:30 PM   Specimen: BLOOD  Result Value Ref Range Status   Specimen Description BLOOD BLOOD LEFT HAND  Final   Special Requests  Final    BOTTLES DRAWN AEROBIC AND ANAEROBIC Blood Culture adequate volume   Culture   Final    NO GROWTH 5 DAYS Performed at [facility], [address] [place] [place], [place] [address]    Report Status 12/10/2020 FINAL  Final  Urine Culture     Status: None   Collection Time: 12/04/20 11:30 PM   Specimen: In/Out Cath Urine  Result Value Ref Range Status   Specimen Description   Final    IN/OUT CATH URINE Performed at [facility], [address], [place], [place] [address]    Special Requests   Final    NONE Performed at [facility], [address], [place], [place] [address]    Culture   Final    NO GROWTH Performed at [facility], [address], [place], [place] [address]    Report Status 12/06/2020 FINAL  Final  Resp Panel by RT-PCR (Flu A&B, Covid) Nasopharyngeal Swab     Status: None   Collection Time: 12/04/20 11:30 PM   Specimen:  Nasopharyngeal Swab; Nasopharyngeal(NP) swabs in vial transport medium  Result Value Ref Range Status   SARS Coronavirus 2 by RT PCR NEGATIVE NEGATIVE Final    Comment: (NOTE) SARS-CoV-2 target nucleic acids are NOT DETECTED.  The SARS-CoV-2 RNA is generally detectable in upper respiratory specimens during the acute phase of infection. The lowest concentration of SARS-CoV-2 viral copies this assay can detect is 138 copies/mL. A negative result does not preclude SARS-Cov-2 infection and should not be used as the sole basis for treatment or other patient management decisions. A negative result may occur with  improper specimen collection/handling, submission of specimen other than nasopharyngeal swab, presence of viral mutation(s) within the areas targeted by this assay, and inadequate number of viral copies(<138 copies/mL). A negative result must be combined with clinical observations, patient history, and epidemiological information. The expected result is Negative.  Fact Sheet for Patients:  BloggerCourse.com  Fact Sheet for Healthcare Providers:  SeriousBroker.it  This test is no t yet approved or cleared by the [place] FDA and  has been authorized for detection and/or diagnosis of SARS-CoV-2 by FDA under an Emergency Use Authorization (EUA). This EUA will remain  in effect (meaning this test can be used) for the duration of the COVID-19 declaration under Section 564(b)(1) of the Act, 21 U.S.C.section 360bbb-3(b)(1), unless the authorization is terminated  or revoked sooner.       Influenza A by PCR NEGATIVE NEGATIVE Final   Influenza B by PCR NEGATIVE NEGATIVE Final    Comment: (NOTE) The Xpert Xpress SARS-CoV-2/FLU/RSV plus assay is intended as an aid in the diagnosis of influenza from Nasopharyngeal swab specimens and should not be used as a sole basis for treatment. Nasal washings and aspirates are unacceptable for  Xpert Xpress SARS-CoV-2/FLU/RSV testing.  Fact Sheet for Patients: BloggerCourse.com  Fact Sheet for Healthcare Providers: SeriousBroker.it  This test is not yet approved or cleared by the [place] FDA and has been authorized for detection and/or diagnosis of SARS-CoV-2 by FDA under an Emergency Use Authorization (EUA). This EUA will remain in effect (meaning this test can be used) for the duration of the COVID-19 declaration under Section 564(b)(1) of the Act, 21 U.S.C. section 360bbb-3(b)(1), unless the authorization is terminated or revoked.  Performed at [facility], [address], [place], [place] [address]          Radiology Studies: DG Chest 2 View  Result Date:  12/13/2020 CLINICAL DATA:  Dyspnea EXAM: CHEST - 2 VIEW COMPARISON:  12/04/2020 FINDINGS: Slight worsening extensive diffuse and bilateral reticulonodular interstitial opacities. This may represent some component of superimposed edema or infection on chronic fibrotic lung disease. Heart is enlarged. No large effusion or pneumothorax. Low lung volumes. Aorta atherosclerotic and degenerative changes of the spine. IMPRESSION: Slight worsening diffuse interstitial opacities concerning for superimposed edema, less likely infection on background chronic fibrotic lung disease. No large effusion or pneumothorax Aortic Atherosclerosis (ICD10-I70.0). Electronically Signed   By: Judie Petit.  Shick M.D.   On: 12/13/2020 10:54        Scheduled Meds:  docusate sodium  100 mg Oral Daily   enoxaparin (LOVENOX) injection  40 mg Subcutaneous Q24H   hydrocortisone cream   Topical BID   insulin aspart  0-20 Units Subcutaneous TID AC & HS   insulin aspart  3 Units Subcutaneous TID WC   irbesartan  300 mg Oral Daily   loratadine  10 mg Oral Daily   mometasone-formoterol  2 puff Inhalation BID   pantoprazole  40 mg Oral Daily   Pirfenidone  2 tablet Oral TID   polyethylene  glycol  17 g Oral Daily   rosuvastatin  40 mg Oral Daily   umeclidinium bromide  1 puff Inhalation Daily   Vitamin D (Ergocalciferol)  50,000 Units Oral Q7 days   Continuous Infusions:   LOS: 9 days     Jacquelin Hawking, MD [facility] 12/14/2020, 8:46 AM  If 7PM-7AM, please contact night-coverage [email or web address]

## 2020-12-14 NOTE — Progress Notes (Signed)
Mobility Specialist - Progress Note   12/14/20 1300  Mobility  Activity Ambulated in room  Level of Assistance Standby assist, set-up cues, supervision of patient - no hands on  Assistive Device Front wheel walker  Distance Ambulated (ft) 40 ft  Mobility Ambulated with assistance in room  Mobility Response Tolerated well  Mobility performed by Mobility specialist  $Mobility charge 1 Mobility    Pre-mobility: 97% SpO2 During mobility: 90% SpO2 Post-mobility: 95% SPO2   Pt ambulated in room with supervision. First bout of activity on 6L, second bout on 5L with little to no change in saturations. Course set-up with mobility initiating seated rest breaks every ~10'. O2 maintained >/= 90% during ambulation, desats to low 80s-high 70s once seated. Pt transferred to [facility] prior to exit for urinary output and returned to recliner. Pt motivated to attempt ambulation again this afternoon, will attempt as time permits.    Filiberto Pinks Mobility Specialist 12/14/20, 1:13 PM

## 2020-12-14 NOTE — Progress Notes (Signed)
Physical Therapy Treatment Patient Details Name: Sierra Guzman MRN: [id number] DOB: 1940-07-13 Today's Date: 12/14/2020    History of Present Illness Pt is an 80 y/o F admitted on 12/04/20 with c/c of DOE that has been worsening over the past couple days. Pt is being treated for acute on chronic hypoxic respiratory failure likely due to exacerbation of interstitial lung disease/interstitial pulmonary fibrosis. PMH: asthma, stage 3 CKD, CAD, DM2, gout, dyslipidemia, GERD    PT Comments    Pt was sitting in recliner upon arriving on 6 L o2 with sao2 100%. Decreased to 2 L o2 and pt able to maintain > 90% until she stood and ambulated. At baseline, pt was on 5 L o2 during ambulation/standing activity. She desaturates to 78% at worst during ambulation 40 ft with RW. She ambulated 3 x 40 ft without LOB or safety concern. Does desaturate each trial with prolonged recovery time. Gait trial recovery time; 1st) 1 minute 22 sec, 2nd) 52 sec, 3rd) 1 minute 34 sec. Overall pt tolerated well but is severely limited by fatigue/o2  limitations. Pt does not feel she is at her baseline. Author recommends DC to SNF to improve activity tolerance while improving independence during ADLs. Acute PT will continue to progress as able per pt current POC.    Follow Up Recommendations  SNF;Supervision for mobility/OOB     Equipment Recommendations  None recommended by PT       Precautions / Restrictions Precautions Precautions: Fall Restrictions Weight Bearing Restrictions: No    Mobility  Bed Mobility  General bed mobility comments: in recliner pre/post session    Transfers Overall transfer level: Needs assistance Equipment used: Rolling walker (2 wheeled) Transfers: Sit to/from Stand Sit to Stand: Supervision         General transfer comment: no physical assistance required to stand or sit. Vcs oinly for breathing control and breathing techniques  Ambulation/Gait Ambulation/Gait assistance:  Supervision Gait Distance (Feet): 40 Feet Assistive device: Rolling walker (2 wheeled) Gait Pattern/deviations: Step-through pattern Gait velocity: decreased   General Gait Details: pt ambulated 3 x 40 ft with 6 L o2. She desaturates to 78 but quickly recovers with seated rest. First trial recovers in 1 minute and 22 sec to > 88%, secound trial recovers to > 88% in 52 sec., third attempt recovers in 34 sec.         Cognition Arousal/Alertness: Awake/alert Behavior During Therapy: WFL for tasks assessed/performed Overall Cognitive Status: Within Functional Limits for tasks assessed    General Comments: Pt is extremely motivated and pleasant. A and O x 4 + good historian of past medical hx             Pertinent Vitals/Pain Pain Assessment: No/denies pain     PT Goals (current goals can now be found in the care plan section) Acute Rehab PT Goals Patient Stated Goal: "get stronger at rehab then go home." Progress towards PT goals: Progressing toward goals    Frequency    Min 2X/week      PT Plan Current plan remains appropriate       AM-PAC PT "6 Clicks" Mobility   Outcome Measure  Help needed turning from your back to your side while in a flat bed without using bedrails?: A Little Help needed moving from lying on your back to sitting on the side of a flat bed without using bedrails?: A Little Help needed moving to and from a bed to a chair (including a wheelchair)?: A  Little Help needed standing up from a chair using your arms (e.g., wheelchair or bedside chair)?: A Little Help needed to walk in hospital room?: A Little Help needed climbing 3-5 steps with a railing? : A Little 6 Click Score: 18    End of Session Equipment Utilized During Treatment: Gait belt;Oxygen Activity Tolerance: Patient tolerated treatment well Patient left: in chair;with call bell/phone within reach;with chair alarm set Nurse Communication: Mobility status PT Visit Diagnosis: Muscle  weakness (generalized) (M62.81)     Time: 3785-8850 PT Time Calculation (min) (ACUTE ONLY): 25 min  Charges:  $Gait Training: 8-22 mins $Therapeutic Activity: 8-22 mins                    Jetta Lout PTA 12/14/20, 2:30 PM

## 2020-12-14 NOTE — Progress Notes (Signed)
SATURATION QUALIFICATIONS: (This note is used to comply with regulatory documentation for home oxygen)  Patient Saturations on Room Air at Rest = 86%  Patient on 2L resting 98%  Patient Saturations on 2L while standing = 85%  Patient Saturation on 6L while sitting =96%  Patient Saturations on 6L while ambulating= 82%  Patient back in chair on 2L of [place] resting at 94%  Please briefly explain why patient needs home oxygen: Pulmonary fibrosis

## 2020-12-14 NOTE — Progress Notes (Signed)
Mobility Specialist - Progress Note   12/14/20 1525  Mobility  Activity Transferred to/from [facility]  Level of Assistance Standby assist, set-up cues, supervision of patient - no hands on  Assistive Device Front wheel walker  Distance Ambulated (ft) 4 ft  Mobility Out of bed for toileting  Mobility Response Tolerated well  Mobility performed by Mobility specialist  $Mobility charge 1 Mobility    Pt transferred BSC-chair. Did have BM with urinal output.    Filiberto Pinks Mobility Specialist 12/14/20, 3:25 PM

## 2020-12-14 NOTE — TOC Progression Note (Addendum)
Transition of Care [facility]) - Progression Note    Patient Details  Name: Sierra Guzman MRN: [id number] Date of Birth: [birth year]  [facility]) CM/SW Contact  Chapman Fitch, RN Phone Number: 12/14/2020, 1:15 PM  Clinical Narrative:     --[facility] [phone])- Left VM for Raynelle Fanning --[facility] [phone])- Spoke with Delice Bison.  She has sent referral to DON to review  --[facility] at [facility] [phone])- left VM.  Elige Radon [facility] [phone])- Left VM   Family supposed to give me their decision on facility by 5pm based on the offers we have at that time   Expected Discharge Plan: Home w Home Health Services Barriers to Discharge: Continued Medical Work up  Expected Discharge Plan and Services Expected Discharge Plan: Home w Home Health Services   Discharge Planning Services: CM Consult   Living arrangements for the past 2 months: Single Family Home                           HH Arranged: RN, PT, OT [facility] Agency: [facility] Date [facility] Agency Contacted: 12/07/20   Representative spoke with at [facility] Agency: [place]   Social Determinants of Health (SDOH) Interventions    Readmission Risk Interventions No flowsheet data found.

## 2020-12-15 DIAGNOSIS — R0600 Dyspnea, unspecified: Secondary | ICD-10-CM

## 2020-12-15 LAB — SEDIMENTATION RATE: Sed Rate: 63 mm/hr — ABNORMAL HIGH (ref 0–30)

## 2020-12-15 LAB — GLUCOSE, CAPILLARY
Glucose-Capillary: 106 mg/dL — ABNORMAL HIGH (ref 70–99)
Glucose-Capillary: 168 mg/dL — ABNORMAL HIGH (ref 70–99)
Glucose-Capillary: 65 mg/dL — ABNORMAL LOW (ref 70–99)
Glucose-Capillary: 63 mg/dL — ABNORMAL LOW (ref 70–99)
Glucose-Capillary: 99 mg/dL (ref 70–99)
Glucose-Capillary: 155 mg/dL — ABNORMAL HIGH (ref 70–99)

## 2020-12-15 LAB — PROCALCITONIN: Procalcitonin: 0.17 ng/mL

## 2020-12-15 LAB — MRSA NEXT GEN BY PCR, NASAL: MRSA by PCR Next Gen: NOT DETECTED

## 2020-12-15 LAB — C-REACTIVE PROTEIN: CRP: 2.8 mg/dL — ABNORMAL HIGH (ref <1.0)

## 2020-12-15 LAB — CK: Total CK: 36 U/L — ABNORMAL LOW (ref 38–234)

## 2020-12-15 MED ORDER — METHYLPREDNISOLONE SODIUM SUCC 40 MG IJ SOLR
40.0000 mg | Freq: Two times a day (BID) | INTRAMUSCULAR | Status: DC
  Administered 2020-12-15 – 2020-12-16 (×2): 40 mg via INTRAVENOUS
  Filled 2020-12-15 (×2): qty 1

## 2020-12-15 MED ORDER — FUROSEMIDE 10 MG/ML IJ SOLN
20.0000 mg | Freq: Every day | INTRAMUSCULAR | Status: DC
  Administered 2020-12-15 – 2020-12-17 (×3): 20 mg via INTRAVENOUS
  Filled 2020-12-15 (×3): qty 4

## 2020-12-15 NOTE — Progress Notes (Signed)
PROGRESS NOTE    Sierra Guzman  XFG:[id number] DOB: January 19, 1941 DOA: 12/04/2020 PCP: System, Provider Not In    Brief Narrative:  80 y.o. female with history of asthma CKD stage III, ILD/pulmonary fibrosis, chronic respiratory failure on 2 L of oxygen at rest and 5 L with ambulation, CAD, diabetes mellitus type 2, gout, hyperlipidemia, GERD.  Patient presented secondary to worsening dyspnea on exertion with evidence of acute on chronic respiratory failure with hypoxia and was diagnosed with a mildly flare.  Patient was treated with supplemental oxygen in addition to steroids with improvement  Pt remains on supplemental O2 beyond her baseline, found to have difficulty weaning  Assessment & Plan:   Principal Problem:   Acute on chronic respiratory failure with hypoxemia (HCC) Active Problems:   Hyperlipidemia with target LDL less than 100   Hypertension   Stage 3b chronic kidney disease (HCC)   Acute respiratory failure with hypoxia (HCC)   Acute on chronic respiratory failure with hypoxia Secondary to below.  Patient chronically uses 2 L at rest and 5 L with ambulation.  Currently requiring up to 6 L with ambulation.  PT/OT recommend SNF for discharge. Some edema on chest x-ray (9/5) and patient treated with Lasix 20 mg IV. Weaned to 2 L at rest however continues to desaturate on ambulation trials beyond her baseline O2 requirement -Prolonged attempt at weaning, have consulted Pulmonary   Exacerbation of ILD Patient was earlier given a course of Solu-Medrol 40 mg IV every 8 hours with 4 day prednisone taper - still O2 dependent over her baseline -Pulmonary consulted per above   CKD stage IIIb Baseline creatinine of 1.2.  Does not meet criteria for AKI.  Stable   Anemia Chronic. Patient has required transfusion therapy, IV iron/oral iron therapies.  Also in the setting of his chronic kidney disease.  Currently not taking iron as she was asked to discontinue oral iron. Iron/TIBC/iron  saturations suggests possible iron deficiency anemia but with elevated ferritin, more likely from CKD.   Primary hypertension Patient is on candesartan and metoprolol succinate 50 mg daily which is not resumed on admission.   -BP stable and controlled this AM -Continue irbesartan as tolerated   Diabetes mellitus, type 2 Steroid induced diabetes Current Hemoglobin A1c of 8.1%.  Patient's hemoglobin A1c of 5.4% in February 2022.  Patient reports this is secondary to steroids.   Continue with sliding-scale insulin in addition to NovoLog 3 units with meals.   Hyperlipidemia Patient is on Crestor as an outpatient. -Continue Crestor 40 mg daily   GERD Patient is on omeprazole as an outpatient currently asymptomatic.   Gout Patient is on allopurinol as an outpatient which is not resumed on admission.   -No evidence of flare this AM, appears stable    DVT prophylaxis: Lovenox subq Code Status: Full Family Communication: Pt in room, family at bedside  Status is: Inpatient  Remains inpatient appropriate because:Inpatient level of care appropriate due to severity of illness  Dispo: The patient is from: Home              Anticipated d/c is to: SNF              Patient currently is not medically stable to d/c.   Difficult to place patient No       Consultants:  Pulmonary  Procedures:    Antimicrobials: Anti-infectives (From admission, onward)    None       Subjective: Without complaints while at rest. Pt  reports increased sob on attempted ambulation trial this AM  Objective: Vitals:   12/14/20 1657 12/14/20 2017 12/15/20 0428 12/15/20 0809  BP: 112/61 (!) 123/51 (!) 125/54 (!) 123/59  Pulse: 70 78 82 78  Resp:  20 18 20   Temp:  98.4 F (36.9 C) 99 F (37.2 C) 98.4 F (36.9 C)  TempSrc:  Oral Oral Oral  SpO2:  97% 91% 96%  Weight:      Height:        Intake/Output Summary (Last 24 hours) at 12/15/2020 1554 Last data filed at 12/15/2020 1403 Gross per 24 hour   Intake 120 ml  Output 900 ml  Net -780 ml   Filed Weights   12/05/20 0540  Weight: 70 kg    Examination: General exam: Awake, laying in bed, in nad Respiratory system: Normal respiratory effort, no wheezing Cardiovascular system: regular rate, s1, s2 Gastrointestinal system: Soft, nondistended, positive BS Central nervous system: CN2-12 grossly intact, strength intact Extremities: Perfused, no clubbing Skin: Normal skin turgor, no notable skin lesions seen Psychiatry: Mood normal // no visual hallucinations   Data Reviewed: I have personally reviewed following labs and imaging studies  CBC: Recent Labs  Lab 12/09/20 0213 12/10/20 0637 12/11/20 0942 12/13/20 1340  WBC 13.2* 13.0* 12.6* 11.3*  HGB 7.5* 7.9* 8.0* 8.2*  HCT 24.5* 26.3* 26.2* 26.9*  MCV 87.5 86.5 89.7 91.2  PLT 326 340 287 224   Basic Metabolic Panel: Recent Labs  Lab 12/09/20 0213 12/10/20 0637 12/11/20 0942 12/13/20 1340 12/14/20 0513  NA 135 135 139 137 139  K 4.4 4.4 4.1 3.6 4.0  CL 105 103 107 109 108  CO2 26 28 24 24 27   GLUCOSE 81 108* 187* 164* 109*  BUN 27* 25* 18 11 9   CREATININE 1.03* 1.20* 1.20* 1.09* 1.20*  CALCIUM 8.7* 9.2 8.7* 8.8* 8.6*  MG  --   --  2.2 1.9  --   PHOS  --   --  3.2 2.0* 4.3   GFR: Estimated Creatinine Clearance: 36.1 mL/min (A) (by C-G formula based on SCr of 1.2 mg/dL (H)). Liver Function Tests: Recent Labs  Lab 12/14/20 0513  ALBUMIN 2.4*   No results for input(s): LIPASE, AMYLASE in the last 168 hours. No results for input(s): AMMONIA in the last 168 hours. Coagulation Profile: No results for input(s): INR, PROTIME in the last 168 hours. Cardiac Enzymes: No results for input(s): CKTOTAL, CKMB, CKMBINDEX, TROPONINI in the last 168 hours. BNP (last 3 results) No results for input(s): PROBNP in the last 8760 hours. HbA1C: No results for input(s): HGBA1C in the last 72 hours. CBG: Recent Labs  Lab 12/14/20 1152 12/14/20 1601 12/14/20 2111  12/15/20 0810 12/15/20 1152  GLUCAP 148* 138* 133* 106* 168*   Lipid Profile: No results for input(s): CHOL, HDL, LDLCALC, TRIG, CHOLHDL, LDLDIRECT in the last 72 hours. Thyroid Function Tests: No results for input(s): TSH, T4TOTAL, FREET4, T3FREE, THYROIDAB in the last 72 hours. Anemia Panel: Recent Labs    12/13/20 1340  FERRITIN 791*   Sepsis Labs: No results for input(s): PROCALCITON, LATICACIDVEN in the last 168 hours.  No results found for this or any previous visit (from the past 240 hour(s)).   Radiology Studies: No results found.  Scheduled Meds:  docusate sodium  100 mg Oral Daily   enoxaparin (LOVENOX) injection  40 mg Subcutaneous Q24H   hydrocortisone cream   Topical BID   insulin aspart  0-20 Units Subcutaneous TID AC & HS  insulin aspart  3 Units Subcutaneous TID WC   irbesartan  300 mg Oral Daily   loratadine  10 mg Oral Daily   mometasone-formoterol  2 puff Inhalation BID   pantoprazole  40 mg Oral Daily   Pirfenidone  2 tablet Oral TID   polyethylene glycol  17 g Oral Daily   rosuvastatin  40 mg Oral Daily   umeclidinium bromide  1 puff Inhalation Daily   Vitamin D (Ergocalciferol)  50,000 Units Oral Q7 days   Continuous Infusions:   LOS: 10 days   Rickey Barbara, MD [facility] Pager On Amion  If 7PM-7AM, please contact night-coverage 12/15/2020, 3:54 PM

## 2020-12-15 NOTE — Consult Note (Signed)
Pulmonary Medicine          Date: 12/15/2020,   MRN# [id number] Sierra Guzman 07-19-1940     AdmissionWeight: 19 kg                 CurrentWeight: 70 kg    Referring physician: Dr. Wyline Copas  CHIEF COMPLAINT:   Refractory pneumonia concern for interstitial lung disease   HISTORY OF PRESENT ILLNESS   Pleasant 80 year old female with history of moderate persistent asthma, CKD CAD, type 2 diabetes, gout, dyslipidemia and GERD, came into the ER initially with complaints of dyspnea progressively getting worse over 24 to 72 hours.  She generally uses 2 L supplemental oxygen but noted her SPO2 was decreasing to mid 80s and had to increase her oxygen over 5 L to reach normoxia.  She does have cough which is chronic and sometimes wheezing which is related to her asthma.  She denies flulike illness, COVID contacts, nausea vomiting diarrhea, hemoptysis or hematuria.  Upon arrival to the ER she was acutely hypoxemic with tachypnea had hyperglycemia and decreased renal function with creatinine 1.3 as well as hypoalbuminemia with albumin 2.9 her BNP was not significantly elevated at 87 and high-sensitivity troponin was within reference range for CKD patients, she did have anemia with a hemoglobin of 8.4 which is chronic and a UA did show high amounts of glucose in the urine.  Chest imaging with pictorial documentation is included below and was independently reviewed by me.  PCCM consultation was placed for further evaluation management of patient with possible interstitial lung disease refractory to therapy.   PAST MEDICAL HISTORY   Past Medical History:  Diagnosis Date  . Anemia 10/28/2010   unspecified  . Anxiety   . Aortic valve sclerosis   . Arthritis   . Asthma   . Chronic kidney disease    Chronic Kidney Disease---Stage III  . Coronary artery disease 10/28/2010  . DDD (degenerative disc disease), lumbar 2006  . Diabetes mellitus without complication ([place])   . Diabetes type 2,  controlled ([place]) 10/28/2010  . Dyspnea   . GERD (gastroesophageal reflux disease)   . Gout 10/28/2010  . Heart abnormality   . Heart murmur   . History of cardiac catheterization 2006   LAD 50%, D1 50% rest normal.   . History of cardiovascular stress test 09/23/2009    lexiscan Normal Cardiolite test, EF 61%. LV size and function - normal  . History of echocardiogram 05/27/2008   EF 55%; Mild aortic insufficiency with minimal aortic sclerosis. L vent normal.   . Hyperlipidemia   . Hyperlipidemia LDL goal <100 10/28/2010   managed by Dr. Dorena Bodo  . Hypertension 10/28/2010  . Interstitial pneumonia ([place])    unspecified  . Mild vitamin D deficiency 02/22/2011  . Obesity (BMI 30-39.9) 10/28/2010   unspecified  . 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 Personal history of colonic polyps 10/28/2010  . Shortness of breath   . Syncope 2013  . Vitamin B 12 deficiency 10/28/2010     SURGICAL HISTORY   Past Surgical History:  Procedure Laterality Date  . BACK SURGERY  2006   DDD  . BREAST BIOPSY Right    neg  . CARDIAC CATHETERIZATION  2006  . EYE SURGERY Bilateral    Cataract Extraction with IOL  . JOINT REPLACEMENT Left    Left Total Hip Replacement  . JOINT REPLACEMENT     RIGHT 2005; LEFT 2 X 2004, 2011  .  REPLACEMENT TOTAL KNEE Left   . REVISION TOTAL KNEE ARTHROPLASTY Left    Dr. Marry Guan  . TONSILLECTOMY    . TOTAL HIP ARTHROPLASTY Right 12/12/2016   Procedure: TOTAL HIP ARTHROPLASTY ANTERIOR APPROACH;  Surgeon: Hessie Knows, MD;  Location: [facility] ORS;  Service: Orthopedics;  Laterality: Right;  . TOTAL HIP ARTHROPLASTY Left 12/26/2013   Procedure: ARTHROPLASTY HIP TOTAL; Surgeon: Zorita Pang, MD; Location: [facility]; Service: Orthopedics; Laterality: left,   . TOTAL KNEE ARTHROPLASTY     both knees     FAMILY HISTORY   Family History  Problem Relation Age of Onset  . Breast cancer Mother 71  . Diabetes Mother   . Stroke Mother   .  Stroke Brother   . Breast cancer Cousin        pat cousin     SOCIAL HISTORY   Social History   Tobacco Use  . Smoking status: Former    Packs/day: 0.25    Years: 10.00    Pack years: 2.50    Types: Cigarettes    Quit date: 08/18/1990    Years since quitting: 30.3  . Smokeless tobacco: Never  Vaping Use  . Vaping Use: Never used  Substance Use Topics  . Alcohol use: No  . Drug use: No     MEDICATIONS    Home Medication:    Current Medication:  Current Facility-Administered Medications:  .  acetaminophen (TYLENOL) tablet 650 mg, 650 mg, Oral, Q6H PRN, 650 mg at 12/13/20 2112 **OR** acetaminophen (TYLENOL) suppository 650 mg, 650 mg, Rectal, Q6H PRN, Mansy, Jan A, MD .  bisacodyl (DULCOLAX) EC tablet 10 mg, 10 mg, Oral, Daily PRN, Val Riles, MD .  bisacodyl (DULCOLAX) suppository 10 mg, 10 mg, Rectal, Daily PRN, Val Riles, MD .  docusate sodium (COLACE) capsule 100 mg, 100 mg, Oral, Daily, Val Riles, MD, 100 mg at 12/15/20 0855 .  enoxaparin (LOVENOX) injection 40 mg, 40 mg, Subcutaneous, Q24H, Mansy, Jan A, MD, 40 mg at 12/15/20 0857 .  gabapentin (NEURONTIN) capsule 300 mg, 300 mg, Oral, TID PRN, Val Riles, MD, 300 mg at 12/15/20 0919 .  guaiFENesin-dextromethorphan (ROBITUSSIN DM) 100-10 MG/5ML syrup 5 mL, 5 mL, Oral, Q4H PRN, Little Ishikawa, MD, 5 mL at 12/12/20 1725 .  hydrocortisone cream 1 %, , Topical, BID, Val Riles, MD, Given at 12/14/20 2043 .  hydrOXYzine (ATARAX/VISTARIL) tablet 25 mg, 25 mg, Oral, TID PRN, Val Riles, MD .  insulin aspart (novoLOG) injection 0-20 Units, 0-20 Units, Subcutaneous, TID AC & HS, Mansy, Arvella Merles, MD, 3 Units at 12/14/20 2212 .  insulin aspart (novoLOG) injection 3 Units, 3 Units, Subcutaneous, TID WC, Val Riles, MD, 3 Units at 12/15/20 0902 .  ipratropium-albuterol (DUONEB) 0.5-2.5 (3) MG/3ML nebulizer solution 3 mL, 3 mL, Nebulization, Q2H PRN, Little Ishikawa, MD .  irbesartan (AVAPRO) tablet  300 mg, 300 mg, Oral, Daily, Little Ishikawa, MD, 300 mg at 12/15/20 0856 .  loratadine (CLARITIN) tablet 10 mg, 10 mg, Oral, Daily, Little Ishikawa, MD, 10 mg at 12/15/20 0856 .  magnesium hydroxide (MILK OF MAGNESIA) suspension 30 mL, 30 mL, Oral, Daily PRN, Mansy, Jan A, MD, 30 mL at 12/08/20 1057 .  mometasone-formoterol (DULERA) 200-5 MCG/ACT inhaler 2 puff, 2 puff, Inhalation, BID, Little Ishikawa, MD, 2 puff at 12/15/20 [phone] .  ondansetron (ZOFRAN) tablet 4 mg, 4 mg, Oral, Q6H PRN **OR** ondansetron (ZOFRAN) injection 4 mg, 4 mg, Intravenous, Q6H PRN, Mansy, Arvella Merles, MD .  pantoprazole (PROTONIX) EC tablet 40 mg, 40 mg, Oral, Daily, Mariel Aloe, MD, 40 mg at 12/15/20 0856 .  Pirfenidone TABS 534 mg, 2 tablet, Oral, TID, Little Ishikawa, MD, 534 mg at 12/15/20 0919 .  polyethylene glycol (MIRALAX / GLYCOLAX) packet 17 g, 17 g, Oral, Daily, Val Riles, MD, 17 g at 12/15/20 0855 .  rosuvastatin (CRESTOR) tablet 40 mg, 40 mg, Oral, Daily, Little Ishikawa, MD, 40 mg at 12/15/20 0856 .  traZODone (DESYREL) tablet 25 mg, 25 mg, Oral, QHS PRN, Mansy, Jan A, MD .  umeclidinium bromide (INCRUSE ELLIPTA) 62.5 MCG/INH 1 puff, 1 puff, Inhalation, Daily, Little Ishikawa, MD, 1 puff at 12/15/20 0900 .  Vitamin D (Ergocalciferol) (DRISDOL) capsule 50,000 Units, 50,000 Units, Oral, Q7 days, Val Riles, MD, 50,000 Units at 12/08/20 2112    ALLERGIES   Morphine and related     REVIEW OF SYSTEMS    Review of Systems:  Gen:  Denies  fever, sweats, chills weigh loss  HEENT: Denies blurred vision, double vision, ear pain, eye pain, hearing loss, nose bleeds, sore throat Cardiac:  No dizziness, chest pain or heaviness, chest tightness,edema Resp:   Denies cough or sputum porduction, shortness of breath,wheezing, hemoptysis,  Gi: Denies swallowing difficulty, stomach pain, nausea or vomiting, diarrhea, constipation, bowel incontinence Gu:  Denies bladder  incontinence, burning urine Ext:   Denies Joint pain, stiffness or swelling Skin: Denies  skin rash, easy bruising or bleeding or hives Endoc:  Denies polyuria, polydipsia , polyphagia or weight change Psych:   Denies depression, insomnia or hallucinations   Other:  All other systems negative   VS: BP (!) 123/59 (BP Location: Right Arm)   Pulse 78   Temp 98.4 F (36.9 C) (Oral)   Resp 20   Ht _0  (1.651 m)   Wt 70 kg   SpO2 96%   BMI 25.68 kg/m      PHYSICAL EXAM    GENERAL:NAD, no fevers, chills, no weakness no fatigue HEAD: Normocephalic, atraumatic.  EYES: Pupils equal, round, reactive to light. Extraocular muscles intact. No scleral icterus.  MOUTH: Moist mucosal membrane. Dentition intact. No abscess noted.  EAR, NOSE, THROAT: Clear without exudates. No external lesions.  NECK: Supple. No thyromegaly. No nodules. No JVD.  PULMONARY: Diffuse coarse rhonchi right sided +wheezes CARDIOVASCULAR: S1 and S2. Regular rate and rhythm. No murmurs, rubs, or gallops. No edema. Pedal pulses 2+ bilaterally.  GASTROINTESTINAL: Soft, nontender, nondistended. No masses. Positive bowel sounds. No hepatosplenomegaly.  MUSCULOSKELETAL: No swelling, clubbing, or edema. Range of motion full in all extremities.  NEUROLOGIC: Cranial nerves II through XII are intact. No gross focal neurological deficits. Sensation intact. Reflexes intact.  SKIN: No ulceration, lesions, rashes, or cyanosis. Skin warm and dry. Turgor intact.  PSYCHIATRIC: Mood, affect within normal limits. The patient is awake, alert and oriented x 3. Insight, judgment intact.       IMAGING    DG Chest 2 View  Result Date: 12/13/2020 CLINICAL DATA:  Dyspnea EXAM: CHEST - 2 VIEW COMPARISON:  12/04/2020 FINDINGS: Slight worsening extensive diffuse and bilateral reticulonodular interstitial opacities. This may represent some component of superimposed edema or infection on chronic fibrotic lung disease. Heart is enlarged. No  large effusion or pneumothorax. Low lung volumes. Aorta atherosclerotic and degenerative changes of the spine. IMPRESSION: Slight worsening diffuse interstitial opacities concerning for superimposed edema, less likely infection on background chronic fibrotic lung disease. No large effusion or pneumothorax Aortic Atherosclerosis (ICD10-I70.0). Electronically Signed  By: Eugenie Filler M.D.   On: 12/13/2020 10:54   CT Angio Chest PE W and/or Wo Contrast  Result Date: 12/05/2020 CLINICAL DATA:  Weakness, confusion, shortness of breath EXAM: CT ANGIOGRAPHY CHEST WITH CONTRAST TECHNIQUE: Multidetector CT imaging of the chest was performed using the standard protocol during bolus administration of intravenous contrast. Multiplanar CT image reconstructions and MIPs were obtained to evaluate the vascular anatomy. CONTRAST:  59m OMNIPAQUE IOHEXOL 350 MG/ML SOLN COMPARISON:  CT chest dated 11/03/2020 FINDINGS: Cardiovascular: Satisfactory opacification of the bilateral pulmonary arteries to the segmental level. No evidence of pulmonary embolism. Although not tailored for evaluation of the thoracic aorta, there is no evidence thoracic aortic aneurysm or dissection. The heart is top-normal in size.  No pericardial effusion. Coronary atherosclerosis of the LAD and left circumflex. Mediastinum/Nodes: Mild mediastinal lymphadenopathy, including a 15 mm short axis low right paratracheal node (series 5/image 113), likely reactive. Visualized thyroid is unremarkable. Lungs/Pleura: Subpleural reticulation/fibrosis in the lungs bilaterally, lower lobe predominant with associated bronchiectasis. This appearance is similar to the recent prior, reflecting sequela of chronic interstitial lung disease. Progressive ground-glass in the bilateral lower lobes favor atelectasis due to poor inspiration. No suspicious pulmonary nodules. No focal consolidation. No pleural effusion or pneumothorax. Upper Abdomen: Visualized upper abdomen is notable  for a 3.2 cm left upper pole renal cyst. Musculoskeletal: Mild degenerative changes of the visualized thoracolumbar spine. Review of the MIP images confirms the above findings. IMPRESSION: No evidence of pulmonary embolism. Chronic interstitial lung disease. Mild mediastinal lymphadenopathy, likely reactive. Aortic Atherosclerosis (ICD10-I70.0). Electronically Signed   By: SJulian HyM.D.   On: 12/05/2020 01:25   DG Chest Port 1 View  Result Date: 12/04/2020 CLINICAL DATA:  Weakness, confusion, shortness of breath EXAM: PORTABLE CHEST 1 VIEW COMPARISON:  CT chest dated 11/03/2020 FINDINGS: Subpleural reticulation/fibrosis in the lungs bilaterally. Associated lower lobe bronchiectasis. This appearance is unchanged from the prior, reflecting chronic interstitial lung disease. No superimposed consolidation.  No pleural effusion or pneumothorax. The heart is normal in size. IMPRESSION: Stable chronic interstitial lung disease. Electronically Signed   By: SJulian HyM.D.   On: 12/04/2020 23:22   ECHOCARDIOGRAM COMPLETE  Result Date: 12/09/2020    ECHOCARDIOGRAM REPORT   Patient Name:   RMCKINSEY KEAGLEDate of Exam: 12/09/2020 Medical Rec #:  [id number]   Height:       65.0 in Accession #:    [id number]  Weight:       154.3 lb Date of Birth:  730-Jan-1942   BSA:          1.772 m Patient Age:    89years     BP:           120/59 mmHg Patient Gender: F            HR:           70 bpm. Exam Location:  [facility] Procedure: 2D Echo, Cardiac Doppler, Color Doppler and Strain Analysis Indications:     Dyspnea R06.00  History:         Patient has no prior history of Echocardiogram examinations.                  Risk Factors:Hypertension and Diabetes.  Sonographer:     JSherrie SportReferring Phys:  [id number] RilesDiagnosing Phys: TIda RogueMD  Sonographer Comments: Global longitudinal strain was attempted. IMPRESSIONS  1. Left ventricular ejection fraction, by estimation, is 60 to 65%.  The left ventricle has  normal function. The left ventricle has no regional wall motion abnormalities. Left ventricular diastolic parameters are consistent with Grade I diastolic dysfunction (impaired relaxation). The average left ventricular global longitudinal strain is -16.6 %. The global longitudinal strain is normal.  2. Right ventricular systolic function is normal. The right ventricular size is normal.  3. The aortic valve is normal in structure. Aortic valve regurgitation is not visualized. Mild to moderate aortic valve sclerosis/calcification is present, without any evidence of aortic stenosis. FINDINGS  Left Ventricle: Left ventricular ejection fraction, by estimation, is 60 to 65%. The left ventricle has normal function. The left ventricle has no regional wall motion abnormalities. The average left ventricular global longitudinal strain is -16.6 %. The global longitudinal strain is normal. The left ventricular internal cavity size was normal in size. There is no left ventricular hypertrophy. Left ventricular diastolic parameters are consistent with Grade I diastolic dysfunction (impaired relaxation). Right Ventricle: The right ventricular size is normal. No increase in right ventricular wall thickness. Right ventricular systolic function is normal. Left Atrium: Left atrial size was normal in size. Right Atrium: Right atrial size was normal in size. Pericardium: There is no evidence of pericardial effusion. Mitral Valve: The mitral valve is normal in structure. No evidence of mitral valve regurgitation. No evidence of mitral valve stenosis. Tricuspid Valve: The tricuspid valve is normal in structure. Tricuspid valve regurgitation is mild . No evidence of tricuspid stenosis. Aortic Valve: The aortic valve is normal in structure. Aortic valve regurgitation is not visualized. Mild to moderate aortic valve sclerosis/calcification is present, without any evidence of aortic stenosis. Aortic valve mean gradient measures 11.0 mmHg.   Aortic valve peak gradient measures 18.5 mmHg. Aortic valve area, by VTI measures 2.11 cm. Pulmonic Valve: The pulmonic valve was normal in structure. Pulmonic valve regurgitation is not visualized. No evidence of pulmonic stenosis. Aorta: The aortic root is normal in size and structure. Venous: The inferior vena cava is normal in size with greater than 50% respiratory variability, suggesting right atrial pressure of 3 mmHg. IAS/Shunts: No atrial level shunt detected by color flow Doppler.  LEFT VENTRICLE PLAX 2D LVIDd:         3.90 cm  Diastology LVIDs:         2.40 cm  LV e' medial:    5.33 cm/s LV PW:         1.30 cm  LV E/e' medial:  13.9 LV IVS:        0.80 cm  LV e' lateral:   5.00 cm/s LVOT diam:     2.10 cm  LV E/e' lateral: 14.8 LV SV:         93 LV SV Index:   53       2D Longitudinal Strain LVOT Area:     3.46 cm 2D Strain GLS Avg:     -16.6 %                          3D Volume EF:                         3D EF:        54 %                         LV EDV:       85 ml  LV ESV:       39 ml                         LV SV:        46 ml RIGHT VENTRICLE RV Basal diam:  3.70 cm RV S prime:     21.60 cm/s TAPSE (M-mode): 3.2 cm LEFT ATRIUM             Index       RIGHT ATRIUM           Index LA diam:        3.50 cm 1.98 cm/m  RA Area:     21.30 cm LA Vol (A2C):   36.1 ml 20.38 ml/m RA Volume:   63.90 ml  36.07 ml/m LA Vol (A4C):   57.7 ml 32.57 ml/m LA Biplane Vol: 48.3 ml 27.26 ml/m  AORTIC VALVE                    PULMONIC VALVE AV Area (Vmax):    2.07 cm     RVOT Peak grad: 2 mmHg AV Area (Vmean):   1.91 cm AV Area (VTI):     2.11 cm AV Vmax:           215.33 cm/s AV Vmean:          156.667 cm/s AV VTI:            0.441 m AV Peak Grad:      18.5 mmHg AV Mean Grad:      11.0 mmHg LVOT Vmax:         129.00 cm/s LVOT Vmean:        86.600 cm/s LVOT VTI:          0.269 m LVOT/AV VTI ratio: 0.61  AORTA Ao Root diam: 3.20 cm MITRAL VALVE                TRICUSPID VALVE MV Area (PHT):  3.02 cm     TR Peak grad:   22.3 mmHg MV Decel Time: 251 msec     TR Vmax:        236.00 cm/s MV E velocity: 74.10 cm/s MV A velocity: 106.00 cm/s  SHUNTS MV E/A ratio:  0.70         Systemic VTI:  0.27 m                             Systemic Diam: 2.10 cm Ida Rogue MD Electronically signed by Ida Rogue MD Signature Date/Time: 12/09/2020/5:40:42 PM    Final        CLINICAL DATA:  Weakness, confusion, shortness of breath   EXAM: CT ANGIOGRAPHY CHEST WITH CONTRAST   TECHNIQUE: Multidetector CT imaging of the chest was performed using the standard protocol during bolus administration of intravenous contrast. Multiplanar CT image reconstructions and MIPs were obtained to evaluate the vascular anatomy.   CONTRAST:  78m OMNIPAQUE IOHEXOL 350 MG/ML SOLN   COMPARISON:  CT chest dated 11/03/2020   FINDINGS: Cardiovascular: Satisfactory opacification of the bilateral pulmonary arteries to the segmental level. No evidence of pulmonary embolism.   Although not tailored for evaluation of the thoracic aorta, there is no evidence thoracic aortic aneurysm or dissection.   The heart is top-normal in size.  No pericardial effusion.   Coronary atherosclerosis of the LAD and left circumflex.   Mediastinum/Nodes: Mild mediastinal lymphadenopathy,  including a 15 mm short axis low right paratracheal node (series 5/image 113), likely reactive.   Visualized thyroid is unremarkable.   Lungs/Pleura: Subpleural reticulation/fibrosis in the lungs bilaterally, lower lobe predominant with associated bronchiectasis. This appearance is similar to the recent prior, reflecting sequela of chronic interstitial lung disease. Progressive ground-glass in the bilateral lower lobes favor atelectasis due to poor inspiration.   No suspicious pulmonary nodules.   No focal consolidation.   No pleural effusion or pneumothorax.   Upper Abdomen: Visualized upper abdomen is notable for a 3.2 cm left upper  pole renal cyst.   Musculoskeletal: Mild degenerative changes of the visualized thoracolumbar spine.   Review of the MIP images confirms the above findings.   IMPRESSION: No evidence of pulmonary embolism.   Chronic interstitial lung disease.   Mild mediastinal lymphadenopathy, likely reactive.   Aortic Atherosclerosis (ICD10-I70.0).     Electronically Signed   By: Julian Hy M.D.   On: 12/05/2020 01:25   ASSESSMENT/PLAN   Acute on chronic hypoxemic respiratory failure -Patient with findings on CT chest with diffuse groundglass attenuation UIP pattern apical basal gradient suggestive of idiopathic pulmonary fibrosis with acute exacerbation. -We will initiate steroids, Solu-Medrol IV 40 daily -Require monitoring and increasing insulin coverage due to likely reactive hyperglycemia -There is associated bronchiectasis and traction bronchiolectasis -We will obtain respiratory sputum cultures as well as Fungitell serology -ANA comprehensive, CPK, ESR, CRP  -CTPE with no venous thromboembolism  COPD with Moderate persistent asthma -Steroids will dually help ILD and asthma at the same time -patient smoked from age 23 -51appx  Thank you for allowing me to participate in the care of this patient.  Total face to face encounter time for this patient visit was >1mn. >50% of the time was  spent in counseling and coordination of care.   Patient/Family are satisfied with care plan and all questions have been answered.  This document was prepared using Dragon voice recognition software and may include unintentional dictation errors.     FOttie Glazier M.D.  Division of [facility]

## 2020-12-15 NOTE — Care Management Important Message (Signed)
Important Message  Patient Details  Name: Sierra Guzman MRN: [id number] Date of Birth: [birth year]   Medicare Important Message Given:  Yes     [name] 12/15/2020, 11:09 AM

## 2020-12-15 NOTE — TOC Progression Note (Signed)
Transition of Care [facility]) - Progression Note    Patient Details  Name: Sierra Guzman MRN: [id number] Date of Birth: [birth year]  [facility] CM/SW Contact  Chapman Fitch, RN Phone Number: 12/15/2020, 1:44 PM  Clinical Narrative:    Insurance auth for SNF pending    Expected Discharge Plan: Home w Home Health Services Barriers to Discharge: Continued Medical Work up  Expected Discharge Plan and Services Expected Discharge Plan: Home w Home Health Services   Discharge Planning Services: CM Consult   Living arrangements for the past 2 months: Single Family Home                           HH Arranged: RN, PT, OT [facility] Agency: [facility] Date [facility] Agency Contacted: 12/07/20   Representative spoke with at [facility] Agency: [place]   Social Determinants of Health (SDOH) Interventions    Readmission Risk Interventions No flowsheet data found.

## 2020-12-15 NOTE — Progress Notes (Signed)
Physical Therapy Treatment Patient Details Name: Sierra Guzman MRN: [id number] DOB: [birth year] Today's Date: 12/15/2020    History of Present Illness Pt is an 80 y/o F admitted on 12/04/20 with c/c of DOE that has been worsening over the past couple days. Pt is being treated for acute on chronic hypoxic respiratory failure likely due to exacerbation of interstitial lung disease/interstitial pulmonary fibrosis. PMH: asthma, stage 3 CKD, CAD, DM2, gout, dyslipidemia, GERD    PT Comments    Pt was sitting in recliner upon arriving. She is A and O x 4 and extremely motivated. On 6L o2 throughout session. Continues to be able to stand and ambulate short distances however desaturates to 80 % while only ambulating 40 ft. Prolonged ( 2 minutes 25 sec.) recovery to achieve > 90%. She was visibly more SOB than previous day. She requested to rest after 1 x ambulating. Repositioned in recliner post session with call bell in reach and pt resting comfortably. Author feels pt is too deconditioned to safely return home from acute hospital. Recommend DC to SNF to improve activity tolerance while improving independence with ADLs.    Follow Up Recommendations  SNF;Supervision for mobility/OOB     Equipment Recommendations  None recommended by PT       Precautions / Restrictions Precautions Precautions: Fall Restrictions Weight Bearing Restrictions: No    Mobility  Bed Mobility  General bed mobility comments: in recliner pre/post session    Transfers Overall transfer level: Needs assistance Equipment used: Rolling walker (2 wheeled) Transfers: Sit to/from Stand Sit to Stand: Supervision         General transfer comment: no physical assistance required to stand or sit. Vcs only for breathing control and breathing techniques  Ambulation/Gait Ambulation/Gait assistance: Supervision Gait Distance (Feet): 40 Feet Assistive device: Rolling walker (2 wheeled) Gait Pattern/deviations: Step-through  pattern Gait velocity: decreased   General Gait Details: pt was able to ambulate 1 x 40 ft with 6 L o2. destaurates to 80 % with 2 min 25 sec to recover today. much more SOB than previous date. longer recovery time noted. RN aware.    Balance Overall balance assessment: Needs assistance Sitting-balance support: Bilateral upper extremity supported Sitting balance-Leahy Scale: Normal     Standing balance support: Bilateral upper extremity supported;During functional activity Standing balance-Leahy Scale: Good      Cognition Arousal/Alertness: Awake/alert Behavior During Therapy: WFL for tasks assessed/performed Overall Cognitive Status: Within Functional Limits for tasks assessed    General Comments: Pt is extremely motivated and pleasant. A and O x 4 + good historian of past medical hx             Pertinent Vitals/Pain Pain Assessment: No/denies pain     PT Goals (current goals can now be found in the care plan section) Acute Rehab PT Goals Patient Stated Goal: "get stronger at rehab then go home." Progress towards PT goals: Progressing toward goals    Frequency    Min 2X/week      PT Plan Current plan remains appropriate       AM-PAC PT "6 Clicks" Mobility   Outcome Measure  Help needed turning from your back to your side while in a flat bed without using bedrails?: A Little Help needed moving from lying on your back to sitting on the side of a flat bed without using bedrails?: A Little Help needed moving to and from a bed to a chair (including a wheelchair)?: A Little Help needed standing up from  a chair using your arms (e.g., wheelchair or bedside chair)?: A Little Help needed to walk in hospital room?: A Little Help needed climbing 3-5 steps with a railing? : A Little 6 Click Score: 18    End of Session Equipment Utilized During Treatment: Gait belt;Oxygen Activity Tolerance: Patient tolerated treatment well Patient left: in chair;with call bell/phone  within reach;with chair alarm set Nurse Communication: Mobility status PT Visit Diagnosis: Muscle weakness (generalized) (M62.81)     Time: 9144-4584 PT Time Calculation (min) (ACUTE ONLY): 23 min  Charges:  $Gait Training: 8-22 mins $Therapeutic Activity: 8-22 mins                     Jetta Lout PTA 12/15/20, 1:53 PM

## 2020-12-15 NOTE — Progress Notes (Addendum)
Mobility Specialist - Progress Note   12/15/20 1400  Mobility  Activity Ambulated in room  Level of Assistance Standby assist, set-up cues, supervision of patient - no hands on  Assistive Device Front wheel walker  Distance Ambulated (ft) 80 ft  Mobility Ambulated with assistance in room  Mobility Response Tolerated well  Mobility performed by Mobility specialist  $Mobility charge 1 Mobility    Pre-mobility: 100% SpO2 During mobility: 80-94% SpO2 Post-mobility: 97% SpO2   Pt ambulated 40' x 2 in room with supervision and RW. Focus on pacing, breathing technique, and energy conservation. 1st bout pt maintained 90s most of ambulation with 2 very brief desats to mid 80s. Pt motivated for continued activity, rest break taken. 2nd bout maintained >/= 87%. Desat both occassions to low 80s once seated. Recovery time ~45 seconds to achieve sats > 90%. 6L. Denied fatigue and only reported mild SOB this attempt. Pt returned to 2L prior to exit.    Filiberto Pinks Mobility Specialist 12/15/20, 2:51 PM

## 2020-12-15 NOTE — TOC Progression Note (Signed)
Transition of Care [facility]) - Progression Note    Patient Details  Name: Sierra Guzman MRN: [id number] Date of Birth: Jan 13, 1941  Transition of Care [facility]) CM/SW Contact  Chapman Fitch, RN Phone Number: 12/15/2020, 10:29 AM  Clinical Narrative:    Spoke with daughter [name]. Family accepts bed at [facility].  Accepted in HUB.  Debbie at [facility] to start auth   Expected Discharge Plan: Home w Home Health Services Barriers to Discharge: Continued Medical Work up  Expected Discharge Plan and Services Expected Discharge Plan: Home w Home Health Services   Discharge Planning Services: CM Consult   Living arrangements for the past 2 months: Single Family Home                           HH Arranged: RN, PT, OT [facility] Agency: [facility] Date [facility] Agency Contacted: 12/07/20   Representative spoke with at [facility] Agency: [place]   Social Determinants of Health (SDOH) Interventions    Readmission Risk Interventions No flowsheet data found.

## 2020-12-15 NOTE — TOC Progression Note (Signed)
Transition of Care [facility]) - Progression Note    Patient Details  Name: Sierra Guzman MRN: [id number] Date of Birth: Aug 20, 1940  Transition of Care [facility]) CM/SW Contact  Chapman Fitch, RN Phone Number: 12/15/2020, 10:33 AM  Clinical Narrative:    Updated clinical sent in HUB   Expected Discharge Plan: Home w Home Health Services Barriers to Discharge: Continued Medical Work up  Expected Discharge Plan and Services Expected Discharge Plan: Home w Home Health Services   Discharge Planning Services: CM Consult   Living arrangements for the past 2 months: Single Family Home                           HH Arranged: RN, PT, OT [facility] Agency: [facility] Date [facility] Agency Contacted: 12/07/20   Representative spoke with at [facility] Agency: [place]   Social Determinants of Health (SDOH) Interventions    Readmission Risk Interventions No flowsheet data found.

## 2020-12-16 LAB — GLUCOSE, CAPILLARY
Glucose-Capillary: 134 mg/dL — ABNORMAL HIGH (ref 70–99)
Glucose-Capillary: 247 mg/dL — ABNORMAL HIGH (ref 70–99)
Glucose-Capillary: 188 mg/dL — ABNORMAL HIGH (ref 70–99)
Glucose-Capillary: 85 mg/dL (ref 70–99)
Glucose-Capillary: 95 mg/dL (ref 70–99)

## 2020-12-16 MED ORDER — METHYLPREDNISOLONE SODIUM SUCC 40 MG IJ SOLR
40.0000 mg | INTRAMUSCULAR | Status: DC
  Administered 2020-12-17 – 2020-12-18 (×2): 40 mg via INTRAVENOUS
  Filled 2020-12-16 (×2): qty 1

## 2020-12-16 NOTE — Progress Notes (Signed)
Mobility Specialist - Progress Note   12/16/20 0900  Mobility  Activity Transferred:  Bed to chair  Level of Assistance Standby assist, set-up cues, supervision of patient - no hands on  Assistive Device None  Distance Ambulated (ft) 3 ft  Mobility Out of bed to chair with meals  Mobility Response Tolerated well  Mobility performed by Mobility specialist  $Mobility charge 1 Mobility    Pt bumped up to 5L for transfer. Supervision with no AD. O2 maintained 95% during transfer, weaned back down to 2L at rest.    Sierra Guzman Mobility Specialist 12/16/20, 9:17 AM

## 2020-12-16 NOTE — Progress Notes (Signed)
Pulmonary Medicine          Date: 12/16/2020,   MRN# [id number] Sierra Guzman 08/10/1940     AdmissionWeight: 62 kg                 CurrentWeight: 70 kg    Referring physician: Dr. Wyline Copas  CHIEF COMPLAINT:   Refractory pneumonia concern for interstitial lung disease   HISTORY OF PRESENT ILLNESS   Pleasant 80 year old female with history of moderate persistent asthma, CKD CAD, type 2 diabetes, gout, dyslipidemia and GERD, came into the ER initially with complaints of dyspnea progressively getting worse over 24 to 72 hours.  She generally uses 2 L supplemental oxygen but noted her SPO2 was decreasing to mid 80s and had to increase her oxygen over 5 L to reach normoxia.  She does have cough which is chronic and sometimes wheezing which is related to her asthma.  She denies flulike illness, COVID contacts, nausea vomiting diarrhea, hemoptysis or hematuria.  Upon arrival to the ER she was acutely hypoxemic with tachypnea had hyperglycemia and decreased renal function with creatinine 1.3 as well as hypoalbuminemia with albumin 2.9 her BNP was not significantly elevated at 87 and high-sensitivity troponin was within reference range for CKD patients, she did have anemia with a hemoglobin of 8.4 which is chronic and a UA did show high amounts of glucose in the urine.  Chest imaging with pictorial documentation is included below and was independently reviewed by me.  PCCM consultation was placed for further evaluation management of patient with possible interstitial lung disease refractory to therapy.  9/822- Patient is improved she is down to 2l/min [place] and feels slightly better. Plan to continue steroids for now and increase BPH with metaneb  PAST MEDICAL HISTORY   Past Medical History:  Diagnosis Date  . Anemia 10/28/2010   unspecified  . Anxiety   . Aortic valve sclerosis   . Arthritis   . Asthma   . Chronic kidney disease    Chronic Kidney Disease---Stage III  . Coronary artery  disease 10/28/2010  . DDD (degenerative disc disease), lumbar 2006  . Diabetes mellitus without complication ([place])   . Diabetes type 2, controlled ([place]) 10/28/2010  . Dyspnea   . GERD (gastroesophageal reflux disease)   . Gout 10/28/2010  . Heart abnormality   . Heart murmur   . History of cardiac catheterization 2006   LAD 50%, D1 50% rest normal.   . History of cardiovascular stress test 09/23/2009    lexiscan Normal Cardiolite test, EF 61%. LV size and function - normal  . History of echocardiogram 05/27/2008   EF 55%; Mild aortic insufficiency with minimal aortic sclerosis. L vent normal.   . Hyperlipidemia   . Hyperlipidemia LDL goal <100 10/28/2010   managed by Dr. Dorena Bodo  . Hypertension 10/28/2010  . Interstitial pneumonia ([place])    unspecified  . Mild vitamin D deficiency 02/22/2011  . Obesity (BMI 30-39.9) 10/28/2010   unspecified  . 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 Personal history of colonic polyps 10/28/2010  . Shortness of breath   . Syncope 2013  . Vitamin B 12 deficiency 10/28/2010     SURGICAL HISTORY   Past Surgical History:  Procedure Laterality Date  . BACK SURGERY  2006   DDD  . BREAST BIOPSY Right    neg  . CARDIAC CATHETERIZATION  2006  . EYE SURGERY Bilateral    Cataract Extraction with IOL  .  JOINT REPLACEMENT Left    Left Total Hip Replacement  . JOINT REPLACEMENT     RIGHT 2005; LEFT 2 X 2004, 2011  . REPLACEMENT TOTAL KNEE Left   . REVISION TOTAL KNEE ARTHROPLASTY Left    Dr. Marry Guan  . TONSILLECTOMY    . TOTAL HIP ARTHROPLASTY Right 12/12/2016   Procedure: TOTAL HIP ARTHROPLASTY ANTERIOR APPROACH;  Surgeon: Hessie Knows, MD;  Location: [facility] ORS;  Service: Orthopedics;  Laterality: Right;  . TOTAL HIP ARTHROPLASTY Left 12/26/2013   Procedure: ARTHROPLASTY HIP TOTAL; Surgeon: Zorita Pang, MD; Location: [place]; Service: Orthopedics; Laterality: left,   . TOTAL KNEE ARTHROPLASTY     both knees      FAMILY HISTORY   Family History  Problem Relation Age of Onset  . Breast cancer Mother 58  . Diabetes Mother   . Stroke Mother   . Stroke Brother   . Breast cancer Cousin        pat cousin     SOCIAL HISTORY   Social History   Tobacco Use  . Smoking status: Former    Packs/day: 0.25    Years: 10.00    Pack years: 2.50    Types: Cigarettes    Quit date: 08/18/1990    Years since quitting: 30.3  . Smokeless tobacco: Never  Vaping Use  . Vaping Use: Never used  Substance Use Topics  . Alcohol use: No  . Drug use: No     MEDICATIONS    Home Medication:    Current Medication:  Current Facility-Administered Medications:  .  acetaminophen (TYLENOL) tablet 650 mg, 650 mg, Oral, Q6H PRN, 650 mg at 12/13/20 2112 **OR** acetaminophen (TYLENOL) suppository 650 mg, 650 mg, Rectal, Q6H PRN, Mansy, Jan A, MD .  bisacodyl (DULCOLAX) EC tablet 10 mg, 10 mg, Oral, Daily PRN, Val Riles, MD .  bisacodyl (DULCOLAX) suppository 10 mg, 10 mg, Rectal, Daily PRN, Val Riles, MD .  docusate sodium (COLACE) capsule 100 mg, 100 mg, Oral, Daily, Val Riles, MD, 100 mg at 12/16/20 0840 .  enoxaparin (LOVENOX) injection 40 mg, 40 mg, Subcutaneous, Q24H, Mansy, Jan A, MD, 40 mg at 12/16/20 0840 .  furosemide (LASIX) injection 20 mg, 20 mg, Intravenous, Daily, Lanney Gins, Solmon Bohr, MD, 20 mg at 12/16/20 0841 .  gabapentin (NEURONTIN) capsule 300 mg, 300 mg, Oral, TID PRN, Val Riles, MD, 300 mg at 12/15/20 0919 .  guaiFENesin-dextromethorphan (ROBITUSSIN DM) 100-10 MG/5ML syrup 5 mL, 5 mL, Oral, Q4H PRN, Little Ishikawa, MD, 5 mL at 12/12/20 1725 .  hydrocortisone cream 1 %, , Topical, BID, Val Riles, MD, Given at 12/15/20 2050 .  hydrOXYzine (ATARAX/VISTARIL) tablet 25 mg, 25 mg, Oral, TID PRN, Val Riles, MD .  insulin aspart (novoLOG) injection 0-20 Units, 0-20 Units, Subcutaneous, TID AC & HS, Mansy, Arvella Merles, MD, 3 Units at 12/15/20 2229 .  insulin aspart (novoLOG)  injection 3 Units, 3 Units, Subcutaneous, TID WC, Val Riles, MD, 3 Units at 12/16/20 0844 .  ipratropium-albuterol (DUONEB) 0.5-2.5 (3) MG/3ML nebulizer solution 3 mL, 3 mL, Nebulization, Q2H PRN, Little Ishikawa, MD .  irbesartan (AVAPRO) tablet 300 mg, 300 mg, Oral, Daily, Little Ishikawa, MD, 300 mg at 12/16/20 0839 .  loratadine (CLARITIN) tablet 10 mg, 10 mg, Oral, Daily, Little Ishikawa, MD, 10 mg at 12/16/20 0842 .  magnesium hydroxide (MILK OF MAGNESIA) suspension 30 mL, 30 mL, Oral, Daily PRN, Mansy, Jan A, MD, 30 mL at 12/08/20 1057 .  [START ON  12/17/2020] methylPREDNISolone sodium succinate (SOLU-MEDROL) 40 mg/mL injection 40 mg, 40 mg, Intravenous, Q24H, Lanney Gins, Icelynn Onken, MD .  mometasone-formoterol (DULERA) 200-5 MCG/ACT inhaler 2 puff, 2 puff, Inhalation, BID, Little Ishikawa, MD, 2 puff at 12/16/20 [phone] .  ondansetron (ZOFRAN) tablet 4 mg, 4 mg, Oral, Q6H PRN **OR** ondansetron (ZOFRAN) injection 4 mg, 4 mg, Intravenous, Q6H PRN, Mansy, Jan A, MD .  pantoprazole (PROTONIX) EC tablet 40 mg, 40 mg, Oral, Daily, Mariel Aloe, MD, 40 mg at 12/16/20 0839 .  polyethylene glycol (MIRALAX / GLYCOLAX) packet 17 g, 17 g, Oral, Daily, Val Riles, MD, 17 g at 12/16/20 0840 .  rosuvastatin (CRESTOR) tablet 40 mg, 40 mg, Oral, Daily, Little Ishikawa, MD, 40 mg at 12/16/20 0841 .  traZODone (DESYREL) tablet 25 mg, 25 mg, Oral, QHS PRN, Mansy, Jan A, MD .  umeclidinium bromide (INCRUSE ELLIPTA) 62.5 MCG/INH 1 puff, 1 puff, Inhalation, Daily, Little Ishikawa, MD, 1 puff at 12/16/20 [phone] .  Vitamin D (Ergocalciferol) (DRISDOL) capsule 50,000 Units, 50,000 Units, Oral, Q7 days, Val Riles, MD, 50,000 Units at 12/15/20 2049    ALLERGIES   Morphine and related     REVIEW OF SYSTEMS    Review of Systems:  Gen:  Denies  fever, sweats, chills weigh loss  HEENT: Denies blurred vision, double vision, ear pain, eye pain, hearing loss, nose bleeds, sore  throat Cardiac:  No dizziness, chest pain or heaviness, chest tightness,edema Resp:   Denies cough or sputum porduction, shortness of breath,wheezing, hemoptysis,  Gi: Denies swallowing difficulty, stomach pain, nausea or vomiting, diarrhea, constipation, bowel incontinence Gu:  Denies bladder incontinence, burning urine Ext:   Denies Joint pain, stiffness or swelling Skin: Denies  skin rash, easy bruising or bleeding or hives Endoc:  Denies polyuria, polydipsia , polyphagia or weight change Psych:   Denies depression, insomnia or hallucinations   Other:  All other systems negative   VS: BP 118/61 (BP Location: Left Arm)   Pulse (!) 56   Temp 98.9 F (37.2 C) (Oral)   Resp 17   Ht _0  (1.651 m)   Wt 70 kg   SpO2 99%   BMI 25.68 kg/m      PHYSICAL EXAM    GENERAL:NAD, no fevers, chills, no weakness no fatigue HEAD: Normocephalic, atraumatic.  EYES: Pupils equal, round, reactive to light. Extraocular muscles intact. No scleral icterus.  MOUTH: Moist mucosal membrane. Dentition intact. No abscess noted.  EAR, NOSE, THROAT: Clear without exudates. No external lesions.  NECK: Supple. No thyromegaly. No nodules. No JVD.  PULMONARY: Diffuse coarse rhonchi right sided +wheezes CARDIOVASCULAR: S1 and S2. Regular rate and rhythm. No murmurs, rubs, or gallops. No edema. Pedal pulses 2+ bilaterally.  GASTROINTESTINAL: Soft, nontender, nondistended. No masses. Positive bowel sounds. No hepatosplenomegaly.  MUSCULOSKELETAL: No swelling, clubbing, or edema. Range of motion full in all extremities.  NEUROLOGIC: Cranial nerves II through XII are intact. No gross focal neurological deficits. Sensation intact. Reflexes intact.  SKIN: No ulceration, lesions, rashes, or cyanosis. Skin warm and dry. Turgor intact.  PSYCHIATRIC: Mood, affect within normal limits. The patient is awake, alert and oriented x 3. Insight, judgment intact.       IMAGING    DG Chest 2 View  Result Date:  12/13/2020 CLINICAL DATA:  Dyspnea EXAM: CHEST - 2 VIEW COMPARISON:  12/04/2020 FINDINGS: Slight worsening extensive diffuse and bilateral reticulonodular interstitial opacities. This may represent some component of superimposed edema or infection on chronic fibrotic lung disease.  Heart is enlarged. No large effusion or pneumothorax. Low lung volumes. Aorta atherosclerotic and degenerative changes of the spine. IMPRESSION: Slight worsening diffuse interstitial opacities concerning for superimposed edema, less likely infection on background chronic fibrotic lung disease. No large effusion or pneumothorax Aortic Atherosclerosis (ICD10-I70.0). Electronically Signed   By: Jerilynn Mages.  Shick M.D.   On: 12/13/2020 10:54   CT Angio Chest PE W and/or Wo Contrast  Result Date: 12/05/2020 CLINICAL DATA:  Weakness, confusion, shortness of breath EXAM: CT ANGIOGRAPHY CHEST WITH CONTRAST TECHNIQUE: Multidetector CT imaging of the chest was performed using the standard protocol during bolus administration of intravenous contrast. Multiplanar CT image reconstructions and MIPs were obtained to evaluate the vascular anatomy. CONTRAST:  66m OMNIPAQUE IOHEXOL 350 MG/ML SOLN COMPARISON:  CT chest dated 11/03/2020 FINDINGS: Cardiovascular: Satisfactory opacification of the bilateral pulmonary arteries to the segmental level. No evidence of pulmonary embolism. Although not tailored for evaluation of the thoracic aorta, there is no evidence thoracic aortic aneurysm or dissection. The heart is top-normal in size.  No pericardial effusion. Coronary atherosclerosis of the LAD and left circumflex. Mediastinum/Nodes: Mild mediastinal lymphadenopathy, including a 15 mm short axis low right paratracheal node (series 5/image 113), likely reactive. Visualized thyroid is unremarkable. Lungs/Pleura: Subpleural reticulation/fibrosis in the lungs bilaterally, lower lobe predominant with associated bronchiectasis. This appearance is similar to the recent  prior, reflecting sequela of chronic interstitial lung disease. Progressive ground-glass in the bilateral lower lobes favor atelectasis due to poor inspiration. No suspicious pulmonary nodules. No focal consolidation. No pleural effusion or pneumothorax. Upper Abdomen: Visualized upper abdomen is notable for a 3.2 cm left upper pole renal cyst. Musculoskeletal: Mild degenerative changes of the visualized thoracolumbar spine. Review of the MIP images confirms the above findings. IMPRESSION: No evidence of pulmonary embolism. Chronic interstitial lung disease. Mild mediastinal lymphadenopathy, likely reactive. Aortic Atherosclerosis (ICD10-I70.0). Electronically Signed   By: SJulian HyM.D.   On: 12/05/2020 01:25   DG Chest Port 1 View  Result Date: 12/04/2020 CLINICAL DATA:  Weakness, confusion, shortness of breath EXAM: PORTABLE CHEST 1 VIEW COMPARISON:  CT chest dated 11/03/2020 FINDINGS: Subpleural reticulation/fibrosis in the lungs bilaterally. Associated lower lobe bronchiectasis. This appearance is unchanged from the prior, reflecting chronic interstitial lung disease. No superimposed consolidation.  No pleural effusion or pneumothorax. The heart is normal in size. IMPRESSION: Stable chronic interstitial lung disease. Electronically Signed   By: SJulian HyM.D.   On: 12/04/2020 23:22   ECHOCARDIOGRAM COMPLETE  Result Date: 12/09/2020    ECHOCARDIOGRAM REPORT   Patient Name:   RSEERAT PEADENDate of Exam: 12/09/2020 Medical Rec #:  [id number]   Height:       65.0 in Accession #:    [id number]  Weight:       154.3 lb Date of Birth:  722-Apr-1942   BSA:          1.772 m Patient Age:    81years     BP:           120/59 mmHg Patient Gender: F            HR:           70 bpm. Exam Location:  [facility] Procedure: 2D Echo, Cardiac Doppler, Color Doppler and Strain Analysis Indications:     Dyspnea R06.00  History:         Patient has no prior history of Echocardiogram examinations.  Risk  Factors:Hypertension and Diabetes.  Sonographer:     Sherrie Sport Referring Phys:  [id number] Val Riles Diagnosing Phys: Ida Rogue MD  Sonographer Comments: Global longitudinal strain was attempted. IMPRESSIONS  1. Left ventricular ejection fraction, by estimation, is 60 to 65%. The left ventricle has normal function. The left ventricle has no regional wall motion abnormalities. Left ventricular diastolic parameters are consistent with Grade I diastolic dysfunction (impaired relaxation). The average left ventricular global longitudinal strain is -16.6 %. The global longitudinal strain is normal.  2. Right ventricular systolic function is normal. The right ventricular size is normal.  3. The aortic valve is normal in structure. Aortic valve regurgitation is not visualized. Mild to moderate aortic valve sclerosis/calcification is present, without any evidence of aortic stenosis. FINDINGS  Left Ventricle: Left ventricular ejection fraction, by estimation, is 60 to 65%. The left ventricle has normal function. The left ventricle has no regional wall motion abnormalities. The average left ventricular global longitudinal strain is -16.6 %. The global longitudinal strain is normal. The left ventricular internal cavity size was normal in size. There is no left ventricular hypertrophy. Left ventricular diastolic parameters are consistent with Grade I diastolic dysfunction (impaired relaxation). Right Ventricle: The right ventricular size is normal. No increase in right ventricular wall thickness. Right ventricular systolic function is normal. Left Atrium: Left atrial size was normal in size. Right Atrium: Right atrial size was normal in size. Pericardium: There is no evidence of pericardial effusion. Mitral Valve: The mitral valve is normal in structure. No evidence of mitral valve regurgitation. No evidence of mitral valve stenosis. Tricuspid Valve: The tricuspid valve is normal in structure. Tricuspid valve regurgitation  is mild . No evidence of tricuspid stenosis. Aortic Valve: The aortic valve is normal in structure. Aortic valve regurgitation is not visualized. Mild to moderate aortic valve sclerosis/calcification is present, without any evidence of aortic stenosis. Aortic valve mean gradient measures 11.0 mmHg.  Aortic valve peak gradient measures 18.5 mmHg. Aortic valve area, by VTI measures 2.11 cm. Pulmonic Valve: The pulmonic valve was normal in structure. Pulmonic valve regurgitation is not visualized. No evidence of pulmonic stenosis. Aorta: The aortic root is normal in size and structure. Venous: The inferior vena cava is normal in size with greater than 50% respiratory variability, suggesting right atrial pressure of 3 mmHg. IAS/Shunts: No atrial level shunt detected by color flow Doppler.  LEFT VENTRICLE PLAX 2D LVIDd:         3.90 cm  Diastology LVIDs:         2.40 cm  LV e' medial:    5.33 cm/s LV PW:         1.30 cm  LV E/e' medial:  13.9 LV IVS:        0.80 cm  LV e' lateral:   5.00 cm/s LVOT diam:     2.10 cm  LV E/e' lateral: 14.8 LV SV:         93 LV SV Index:   53       2D Longitudinal Strain LVOT Area:     3.46 cm 2D Strain GLS Avg:     -16.6 %                          3D Volume EF:                         3D EF:  54 %                         LV EDV:       85 ml                         LV ESV:       39 ml                         LV SV:        46 ml RIGHT VENTRICLE RV Basal diam:  3.70 cm RV S prime:     21.60 cm/s TAPSE (M-mode): 3.2 cm LEFT ATRIUM             Index       RIGHT ATRIUM           Index LA diam:        3.50 cm 1.98 cm/m  RA Area:     21.30 cm LA Vol (A2C):   36.1 ml 20.38 ml/m RA Volume:   63.90 ml  36.07 ml/m LA Vol (A4C):   57.7 ml 32.57 ml/m LA Biplane Vol: 48.3 ml 27.26 ml/m  AORTIC VALVE                    PULMONIC VALVE AV Area (Vmax):    2.07 cm     RVOT Peak grad: 2 mmHg AV Area (Vmean):   1.91 cm AV Area (VTI):     2.11 cm AV Vmax:           215.33 cm/s AV Vmean:           156.667 cm/s AV VTI:            0.441 m AV Peak Grad:      18.5 mmHg AV Mean Grad:      11.0 mmHg LVOT Vmax:         129.00 cm/s LVOT Vmean:        86.600 cm/s LVOT VTI:          0.269 m LVOT/AV VTI ratio: 0.61  AORTA Ao Root diam: 3.20 cm MITRAL VALVE                TRICUSPID VALVE MV Area (PHT): 3.02 cm     TR Peak grad:   22.3 mmHg MV Decel Time: 251 msec     TR Vmax:        236.00 cm/s MV E velocity: 74.10 cm/s MV A velocity: 106.00 cm/s  SHUNTS MV E/A ratio:  0.70         Systemic VTI:  0.27 m                             Systemic Diam: 2.10 cm Ida Rogue MD Electronically signed by Ida Rogue MD Signature Date/Time: 12/09/2020/5:40:42 PM    Final        CLINICAL DATA:  Weakness, confusion, shortness of breath   EXAM: CT ANGIOGRAPHY CHEST WITH CONTRAST   TECHNIQUE: Multidetector CT imaging of the chest was performed using the standard protocol during bolus administration of intravenous contrast. Multiplanar CT image reconstructions and MIPs were obtained to evaluate the vascular anatomy.   CONTRAST:  73m OMNIPAQUE IOHEXOL 350 MG/ML SOLN   COMPARISON:  CT chest dated 11/03/2020   FINDINGS: Cardiovascular: Satisfactory opacification of the  bilateral pulmonary arteries to the segmental level. No evidence of pulmonary embolism.   Although not tailored for evaluation of the thoracic aorta, there is no evidence thoracic aortic aneurysm or dissection.   The heart is top-normal in size.  No pericardial effusion.   Coronary atherosclerosis of the LAD and left circumflex.   Mediastinum/Nodes: Mild mediastinal lymphadenopathy, including a 15 mm short axis low right paratracheal node (series 5/image 113), likely reactive.   Visualized thyroid is unremarkable.   Lungs/Pleura: Subpleural reticulation/fibrosis in the lungs bilaterally, lower lobe predominant with associated bronchiectasis. This appearance is similar to the recent prior, reflecting sequela of chronic interstitial  lung disease. Progressive ground-glass in the bilateral lower lobes favor atelectasis due to poor inspiration.   No suspicious pulmonary nodules.   No focal consolidation.   No pleural effusion or pneumothorax.   Upper Abdomen: Visualized upper abdomen is notable for a 3.2 cm left upper pole renal cyst.   Musculoskeletal: Mild degenerative changes of the visualized thoracolumbar spine.   Review of the MIP images confirms the above findings.   IMPRESSION: No evidence of pulmonary embolism.   Chronic interstitial lung disease.   Mild mediastinal lymphadenopathy, likely reactive.   Aortic Atherosclerosis (ICD10-I70.0).     Electronically Signed   By: Julian Hy M.D.   On: 12/05/2020 01:25   ASSESSMENT/PLAN   Acute on chronic hypoxemic respiratory failure -Patient with findings on CT chest with diffuse groundglass attenuation UIP pattern apical basal gradient suggestive of idiopathic pulmonary fibrosis with acute exacerbation. -We will initiate steroids, Solu-Medrol IV 40 daily -Require monitoring and increasing insulin coverage due to likely reactive hyperglycemia -There is associated bronchiectasis and traction bronchiolectasis -We will obtain respiratory sputum cultures as well as Fungitell serology -ANA comprehensive, CPK, ESR, CRP  -CTPE with no venous thromboembolism  COPD with Moderate persistent asthma -Steroids will dually help ILD and asthma at the same time -patient smoked from age 34 -34appx  Thank you for allowing me to participate in the care of this patient.  Total face to face encounter time for this patient visit was >35mn. >50% of the time was  spent in counseling and coordination of care.   Patient/Family are satisfied with care plan and all questions have been answered.  This document was prepared using Dragon voice recognition software and may include unintentional dictation errors.     FOttie Glazier M.D.  [place]

## 2020-12-16 NOTE — Progress Notes (Signed)
PROGRESS NOTE    Sierra Guzman  UXN:[id number] DOB: 1940-04-15 DOA: 12/04/2020 PCP: System, Provider Not In    Brief Narrative:  80 y.o. female with history of asthma CKD stage III, ILD/pulmonary fibrosis, chronic respiratory failure on 2 L of oxygen at rest and 5 L with ambulation, CAD, diabetes mellitus type 2, gout, hyperlipidemia, GERD.  Patient presented secondary to worsening dyspnea on exertion with evidence of acute on chronic respiratory failure with hypoxia and was diagnosed with a mildly flare.  Patient was treated with supplemental oxygen in addition to steroids with improvement  Pt remains on supplemental O2 beyond her baseline, found to have difficulty weaning  Assessment & Plan:   Principal Problem:   Acute on chronic respiratory failure with hypoxemia (HCC) Active Problems:   Hyperlipidemia with target LDL less than 100   Hypertension   Stage 3b chronic kidney disease (HCC)   Acute respiratory failure with hypoxia (HCC)   Acute on chronic respiratory failure with hypoxia Secondary to below.  Patient chronically uses 2 L at rest and 5 L with ambulation.  Currently requiring up to 6 L with ambulation.  PT/OT recommend SNF for discharge. Some edema on chest x-ray (9/5) and patient treated with Lasix 20 mg IV. Weaned to 2 L at rest however continues to desaturate on ambulation trials beyond her baseline O2 requirement -Prolonged attempt at weaning, have consulted Pulmonary, appreciate recs -Pulmonary recs to cont current steroids (IV solumedrol 40mg  q24hrs) and to increase BPH with metaneb   Exacerbation of ILD - still O2 dependent over her baseline -Appreciate Pulmonary recs. Continue course of steroids per above   CKD stage IIIb Baseline creatinine of 1.2.  Does not meet criteria for AKI.  Stable   Anemia Chronic. Patient has required transfusion therapy, IV iron/oral iron therapies.  Also in the setting of his chronic kidney disease.  Currently not taking iron as she  was asked to discontinue oral iron. Iron/TIBC/iron saturations suggests possible iron deficiency anemia but with elevated ferritin, more likely from CKD. -Hgb trends had remained stable. Repeat cbc in AM   Primary hypertension Home meds include candesartan and metoprolol succinate 50 mg daily which was not resumed on admission.   -BP stable and controlled this AM -Continue irbesartan as tolerated   Diabetes mellitus, type 2 Steroid induced diabetes Current Hemoglobin A1c of 8.1%.  Patient's hemoglobin A1c of 5.4% in February 2022.  Patient reports this is secondary to steroids.   Continue with sliding-scale insulin in addition to NovoLog 3 units with meals.   Hyperlipidemia Patient is on Crestor as an outpatient. -Continue as tolerated   GERD Patient is on omeprazole as an outpatient currently asymptomatic. -Cont protonix as tolerated while in hospital   Gout Patient is on allopurinol as an outpatient which is not resumed on admission.   -Remains without evidence of flare    DVT prophylaxis: Lovenox subq Code Status: Full Family Communication: Pt in room, family not at bedside  Status is: Inpatient  Remains inpatient appropriate because:Inpatient level of care appropriate due to severity of illness  Dispo: The patient is from: Home              Anticipated d/c is to: SNF              Patient currently is not medically stable to d/c.   Difficult to place patient No   Consultants:  Pulmonary  Procedures:    Antimicrobials: Anti-infectives (From admission, onward)    None  Subjective: Reports feeling somewhat better  Objective: Vitals:   12/15/20 2003 12/16/20 0508 12/16/20 0838 12/16/20 1507  BP: 110/63 98/67 118/61 118/72  Pulse: 92 68 (!) 56 90  Resp: 16 18 17 18   Temp: 98.9 F (37.2 C) 98 F (36.7 C) 98.9 F (37.2 C) 98.6 F (37 C)  TempSrc: Oral Oral Oral   SpO2: 93% 99% 99% 92%  Weight:      Height:        Intake/Output Summary (Last  24 hours) at 12/16/2020 1623 Last data filed at 12/16/2020 1131 Gross per 24 hour  Intake 0 ml  Output 2250 ml  Net -2250 ml    Filed Weights   12/05/20 0540  Weight: 70 kg    Examination: General exam: Conversant, in no acute distress Respiratory system: slightly increased resp effort, clear, no audible wheezing Cardiovascular system: regular rhythm, s1-s2 Gastrointestinal system: Nondistended, nontender, pos BS Central nervous system: No seizures, no tremors Extremities: No cyanosis, no joint deformities Skin: No rashes, no pallor Psychiatry: Affect normal // no auditory hallucinations   Data Reviewed: I have personally reviewed following labs and imaging studies  CBC: Recent Labs  Lab 12/10/20 0637 12/11/20 0942 12/13/20 1340  WBC 13.0* 12.6* 11.3*  HGB 7.9* 8.0* 8.2*  HCT 26.3* 26.2* 26.9*  MCV 86.5 89.7 91.2  PLT 340 287 224    Basic Metabolic Panel: Recent Labs  Lab 12/10/20 0637 12/11/20 0942 12/13/20 1340 12/14/20 0513  NA 135 139 137 139  K 4.4 4.1 3.6 4.0  CL 103 107 109 108  CO2 28 24 24 27   GLUCOSE 108* 187* 164* 109*  BUN 25* 18 11 9   CREATININE 1.20* 1.20* 1.09* 1.20*  CALCIUM 9.2 8.7* 8.8* 8.6*  MG  --  2.2 1.9  --   PHOS  --  3.2 2.0* 4.3    GFR: Estimated Creatinine Clearance: 36.1 mL/min (A) (by C-G formula based on SCr of 1.2 mg/dL (H)). Liver Function Tests: Recent Labs  Lab 12/14/20 0513  ALBUMIN 2.4*    No results for input(s): LIPASE, AMYLASE in the last 168 hours. No results for input(s): AMMONIA in the last 168 hours. Coagulation Profile: No results for input(s): INR, PROTIME in the last 168 hours. Cardiac Enzymes: Recent Labs  Lab 12/15/20 1801  CKTOTAL 36*   BNP (last 3 results) No results for input(s): PROBNP in the last 8760 hours. HbA1C: No results for input(s): HGBA1C in the last 72 hours. CBG: Recent Labs  Lab 12/15/20 1656 12/15/20 2117 12/16/20 0820 12/16/20 1209 12/16/20 1358  GLUCAP 99 155* 134*  247* 188*    Lipid Profile: No results for input(s): CHOL, HDL, LDLCALC, TRIG, CHOLHDL, LDLDIRECT in the last 72 hours. Thyroid Function Tests: No results for input(s): TSH, T4TOTAL, FREET4, T3FREE, THYROIDAB in the last 72 hours. Anemia Panel: No results for input(s): VITAMINB12, FOLATE, FERRITIN, TIBC, IRON, RETICCTPCT in the last 72 hours.  Sepsis Labs: Recent Labs  Lab 12/15/20 1801  PROCALCITON 0.17    Recent Results (from the past 240 hour(s))  MRSA Next Gen by PCR, Nasal     Status: None   Collection Time: 12/15/20  6:28 PM   Specimen: Nasal Mucosa; Nasal Swab  Result Value Ref Range Status   MRSA by PCR Next Gen NOT DETECTED NOT DETECTED Final    Comment: (NOTE) The GeneXpert MRSA Assay (FDA approved for NASAL specimens only), is one component of a comprehensive MRSA colonization surveillance program. It is not intended to diagnose  MRSA infection nor to guide or monitor treatment for MRSA infections. Test performance is not FDA approved in patients less than 59 years old. Performed at [facility], [address] [place], [place] [address]      Radiology Studies: No results found.  Scheduled Meds:  docusate sodium  100 mg Oral Daily   enoxaparin (LOVENOX) injection  40 mg Subcutaneous Q24H   furosemide  20 mg Intravenous Daily   hydrocortisone cream   Topical BID   insulin aspart  0-20 Units Subcutaneous TID AC & HS   insulin aspart  3 Units Subcutaneous TID WC   irbesartan  300 mg Oral Daily   loratadine  10 mg Oral Daily   [START ON 12/17/2020] methylPREDNISolone (SOLU-MEDROL) injection  40 mg Intravenous Q24H   mometasone-formoterol  2 puff Inhalation BID   pantoprazole  40 mg Oral Daily   polyethylene glycol  17 g Oral Daily   rosuvastatin  40 mg Oral Daily   umeclidinium bromide  1 puff Inhalation Daily   Vitamin D (Ergocalciferol)  50,000 Units Oral Q7 days   Continuous Infusions:   LOS: 11 days   Rickey Barbara, MD [facility] Pager On Amion  If 7PM-7AM, please contact night-coverage 12/16/2020, 4:23 PM

## 2020-12-16 NOTE — Progress Notes (Signed)
Mobility Specialist - Progress Note   12/16/20 1551  Mobility  Activity Ambulated in room  Level of Assistance Standby assist, set-up cues, supervision of patient - no hands on  Assistive Device Front wheel walker  Distance Ambulated (ft) 80 ft  Mobility Ambulated with assistance in room  Mobility Response Tolerated well  Mobility performed by Mobility specialist  $Mobility charge 1 Mobility    Pre-mobility: 99% SpO2 During mobility: 78%-90% SpO2 Post-mobility: 95% SpO2   Pt ambulated 2 bouts of 40' in room. Increased to 6L for activity. Pt desat to a low of 78% during first bout with difficulty trying to manage breathing. Returned to recliner for rest, extensive recovery time but motivated for second trial. PLB education prior to continuation of activity. Second bout sats maintaining >/= 90% with quicker recovery time. Pt returned to recliner with needs in reach.     Sierra Guzman Mobility Specialist 12/16/20, 4:08 PM

## 2020-12-16 NOTE — TOC Progression Note (Addendum)
Transition of Care [facility]) - Progression Note    Patient Details  Name: Sierra Guzman MRN: [id number] Date of Birth: 10-Sep-1940  Transition of Care [facility] CM/SW Contact  Chapman Fitch, RN Phone Number: 12/16/2020, 9:40 AM  Clinical Narrative:    Message sent to Debbie at [facility] to determine if Berkley Harvey is still pending.  Awaiting response    1055 - confirmed with Debbie that Berkley Harvey is still pending  Expected Discharge Plan: Home w Home Health Services Barriers to Discharge: Continued Medical Work up  Expected Discharge Plan and Services Expected Discharge Plan: Home w Home Health Services   Discharge Planning Services: CM Consult   Living arrangements for the past 2 months: Single Family Home                           HH Arranged: RN, PT, OT [facility] Agency: [facility] Date [facility] Agency Contacted: 12/07/20   Representative spoke with at [facility] Agency: [place]   Social Determinants of Health (SDOH) Interventions    Readmission Risk Interventions No flowsheet data found.

## 2020-12-17 ENCOUNTER — Inpatient Hospital Stay: Payer: Medicare HMO

## 2020-12-17 LAB — COMPREHENSIVE METABOLIC PANEL
ALT: 9 U/L (ref 0–44)
AST: 22 U/L (ref 15–41)
Albumin: 2.8 g/dL — ABNORMAL LOW (ref 3.5–5.0)
Alkaline Phosphatase: 49 U/L (ref 38–126)
Anion gap: 6 (ref 5–15)
BUN: 11 mg/dL (ref 8–23)
CO2: 26 mmol/L (ref 22–32)
Calcium: 9.3 mg/dL (ref 8.9–10.3)
Chloride: 104 mmol/L (ref 98–111)
Creatinine, Ser: 1.13 mg/dL — ABNORMAL HIGH (ref 0.44–1.00)
GFR, Estimated: 49 mL/min — ABNORMAL LOW (ref >60)
Glucose, Bld: 139 mg/dL — ABNORMAL HIGH (ref 70–99)
Potassium: 4 mmol/L (ref 3.5–5.1)
Sodium: 136 mmol/L (ref 135–145)
Total Bilirubin: 0.5 mg/dL (ref 0.3–1.2)
Total Protein: 6.1 g/dL — ABNORMAL LOW (ref 6.5–8.1)

## 2020-12-17 LAB — ANA COMPREHENSIVE PANEL

## 2020-12-17 LAB — GLUCOSE, CAPILLARY
Glucose-Capillary: 135 mg/dL — ABNORMAL HIGH (ref 70–99)
Glucose-Capillary: 144 mg/dL — ABNORMAL HIGH (ref 70–99)
Glucose-Capillary: 180 mg/dL — ABNORMAL HIGH (ref 70–99)
Glucose-Capillary: 104 mg/dL — ABNORMAL HIGH (ref 70–99)

## 2020-12-17 NOTE — Progress Notes (Signed)
Pulmonary Medicine          Date: 12/17/2020,   MRN# [id number] Sierra Guzman 1941/01/11     AdmissionWeight: 47 kg                 CurrentWeight: 70 kg    Referring physician: Dr. Wyline Copas  CHIEF COMPLAINT:   Refractory pneumonia concern for interstitial lung disease   HISTORY OF PRESENT ILLNESS   Pleasant 80 year old female with history of moderate persistent asthma, CKD CAD, type 2 diabetes, gout, dyslipidemia and GERD, came into the ER initially with complaints of dyspnea progressively getting worse over 24 to 72 hours.  She generally uses 2 L supplemental oxygen but noted her SPO2 was decreasing to mid 80s and had to increase her oxygen over 5 L to reach normoxia.  She does have cough which is chronic and sometimes wheezing which is related to her asthma.  She denies flulike illness, COVID contacts, nausea vomiting diarrhea, hemoptysis or hematuria.  Upon arrival to the ER she was acutely hypoxemic with tachypnea had hyperglycemia and decreased renal function with creatinine 1.3 as well as hypoalbuminemia with albumin 2.9 her BNP was not significantly elevated at 87 and high-sensitivity troponin was within reference range for CKD patients, she did have anemia with a hemoglobin of 8.4 which is chronic and a UA did show high amounts of glucose in the urine.  Chest imaging with pictorial documentation is included below and was independently reviewed by me.  PCCM consultation was placed for further evaluation management of patient with possible interstitial lung disease refractory to therapy.  9/822- Patient is improved she is down to 2l/min [place] and feels slightly better. Plan to continue steroids for now and increase BPH with metaneb   12/17/20- patient is now on home O2 requirement , she is waiting of CIR approval for physical therapy  PAST MEDICAL HISTORY   Past Medical History:  Diagnosis Date  . Anemia 10/28/2010   unspecified  . Anxiety   . Aortic valve sclerosis   .  Arthritis   . Asthma   . Chronic kidney disease    Chronic Kidney Disease---Stage III  . Coronary artery disease 10/28/2010  . DDD (degenerative disc disease), lumbar 2006  . Diabetes mellitus without complication ([place])   . Diabetes type 2, controlled ([place]) 10/28/2010  . Dyspnea   . GERD (gastroesophageal reflux disease)   . Gout 10/28/2010  . Heart abnormality   . Heart murmur   . History of cardiac catheterization 2006   LAD 50%, D1 50% rest normal.   . History of cardiovascular stress test 09/23/2009    lexiscan Normal Cardiolite test, EF 61%. LV size and function - normal  . History of echocardiogram 05/27/2008   EF 55%; Mild aortic insufficiency with minimal aortic sclerosis. L vent normal.   . Hyperlipidemia   . Hyperlipidemia LDL goal <100 10/28/2010   managed by Dr. Dorena Bodo  . Hypertension 10/28/2010  . Interstitial pneumonia ([place])    unspecified  . Mild vitamin D deficiency 02/22/2011  . Obesity (BMI 30-39.9) 10/28/2010   unspecified  . 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 Personal history of colonic polyps 10/28/2010  . Shortness of breath   . Syncope 2013  . Vitamin B 12 deficiency 10/28/2010     SURGICAL HISTORY   Past Surgical History:  Procedure Laterality Date  . BACK SURGERY  2006   DDD  . BREAST BIOPSY Right    neg  .  CARDIAC CATHETERIZATION  2006  . EYE SURGERY Bilateral    Cataract Extraction with IOL  . JOINT REPLACEMENT Left    Left Total Hip Replacement  . JOINT REPLACEMENT     RIGHT 2005; LEFT 2 X 2004, 2011  . REPLACEMENT TOTAL KNEE Left   . REVISION TOTAL KNEE ARTHROPLASTY Left    Dr. Marry Guan  . TONSILLECTOMY    . TOTAL HIP ARTHROPLASTY Right 12/12/2016   Procedure: TOTAL HIP ARTHROPLASTY ANTERIOR APPROACH;  Surgeon: Hessie Knows, MD;  Location: [facility] ORS;  Service: Orthopedics;  Laterality: Right;  . TOTAL HIP ARTHROPLASTY Left 12/26/2013   Procedure: ARTHROPLASTY HIP TOTAL; Surgeon: Zorita Pang, MD;  Location: [facility]; Service: Orthopedics; Laterality: left,   . TOTAL KNEE ARTHROPLASTY     both knees     FAMILY HISTORY   Family History  Problem Relation Age of Onset  . Breast cancer Mother 44  . Diabetes Mother   . Stroke Mother   . Stroke Brother   . Breast cancer Cousin        pat cousin     SOCIAL HISTORY   Social History   Tobacco Use  . Smoking status: Former    Packs/day: 0.25    Years: 10.00    Pack years: 2.50    Types: Cigarettes    Quit date: 08/18/1990    Years since quitting: 30.3  . Smokeless tobacco: Never  Vaping Use  . Vaping Use: Never used  Substance Use Topics  . Alcohol use: No  . Drug use: No     MEDICATIONS    Home Medication:    Current Medication:  Current Facility-Administered Medications:  .  acetaminophen (TYLENOL) tablet 650 mg, 650 mg, Oral, Q6H PRN, 650 mg at 12/13/20 2112 **OR** acetaminophen (TYLENOL) suppository 650 mg, 650 mg, Rectal, Q6H PRN, Mansy, Jan A, MD .  bisacodyl (DULCOLAX) EC tablet 10 mg, 10 mg, Oral, Daily PRN, Val Riles, MD .  bisacodyl (DULCOLAX) suppository 10 mg, 10 mg, Rectal, Daily PRN, Val Riles, MD .  docusate sodium (COLACE) capsule 100 mg, 100 mg, Oral, Daily, Val Riles, MD, 100 mg at 12/17/20 0918 .  enoxaparin (LOVENOX) injection 40 mg, 40 mg, Subcutaneous, Q24H, Mansy, Jan A, MD, 40 mg at 12/17/20 7867 .  furosemide (LASIX) injection 20 mg, 20 mg, Intravenous, Daily, Ottie Glazier, MD, 20 mg at 12/16/20 0841 .  gabapentin (NEURONTIN) capsule 300 mg, 300 mg, Oral, TID PRN, Val Riles, MD, 300 mg at 12/15/20 0919 .  guaiFENesin-dextromethorphan (ROBITUSSIN DM) 100-10 MG/5ML syrup 5 mL, 5 mL, Oral, Q4H PRN, Little Ishikawa, MD, 5 mL at 12/12/20 1725 .  hydrocortisone cream 1 %, , Topical, BID, Val Riles, MD, Given at 12/16/20 2118 .  hydrOXYzine (ATARAX/VISTARIL) tablet 25 mg, 25 mg, Oral, TID PRN, Val Riles, MD .  insulin aspart (novoLOG) injection 0-20 Units, 0-20  Units, Subcutaneous, TID [facility], Mansy, Arvella Merles, MD, 3 Units at 12/17/20 0926 .  insulin aspart (novoLOG) injection 3 Units, 3 Units, Subcutaneous, TID WC, Val Riles, MD, 3 Units at 12/17/20 [phone]  ipratropium-albuterol (DUONEB) 0.5-2.5 (3) MG/3ML nebulizer solution 3 mL, 3 mL, Nebulization, Q2H PRN, Little Ishikawa, MD, 3 mL at 12/17/20 0738 .  irbesartan (AVAPRO) tablet 300 mg, 300 mg, Oral, Daily, Little Ishikawa, MD, 300 mg at 12/17/20 9470 .  loratadine (CLARITIN) tablet 10 mg, 10 mg, Oral, Daily, Little Ishikawa, MD, 10 mg at 12/17/20 0918 .  magnesium hydroxide (MILK  OF MAGNESIA) suspension 30 mL, 30 mL, Oral, Daily PRN, Mansy, Jan A, MD, 30 mL at 12/08/20 1057 .  methylPREDNISolone sodium succinate (SOLU-MEDROL) 40 mg/mL injection 40 mg, 40 mg, Intravenous, Q24H, Lanney Gins, Shailen Thielen, MD .  mometasone-formoterol (DULERA) 200-5 MCG/ACT inhaler 2 puff, 2 puff, Inhalation, BID, Little Ishikawa, MD, 2 puff at 12/17/20 [phone]  ondansetron (ZOFRAN) tablet 4 mg, 4 mg, Oral, Q6H PRN **OR** ondansetron (ZOFRAN) injection 4 mg, 4 mg, Intravenous, Q6H PRN, Mansy, Jan A, MD .  pantoprazole (PROTONIX) EC tablet 40 mg, 40 mg, Oral, Daily, Mariel Aloe, MD, 40 mg at 12/17/20 0918 .  polyethylene glycol (MIRALAX / GLYCOLAX) packet 17 g, 17 g, Oral, Daily, Val Riles, MD, 17 g at 12/16/20 0840 .  rosuvastatin (CRESTOR) tablet 40 mg, 40 mg, Oral, Daily, Little Ishikawa, MD, 40 mg at 12/17/20 8891 .  traZODone (DESYREL) tablet 25 mg, 25 mg, Oral, QHS PRN, Mansy, Jan A, MD .  umeclidinium bromide (INCRUSE ELLIPTA) 62.5 MCG/INH 1 puff, 1 puff, Inhalation, Daily, Little Ishikawa, MD, 1 puff at 12/17/20 [phone] .  Vitamin D (Ergocalciferol) (DRISDOL) capsule 50,000 Units, 50,000 Units, Oral, Q7 days, Val Riles, MD, 50,000 Units at 12/15/20 2049    ALLERGIES   Morphine and related     REVIEW OF SYSTEMS    Review of Systems:  Gen:  Denies  fever, sweats, chills weigh  loss  HEENT: Denies blurred vision, double vision, ear pain, eye pain, hearing loss, nose bleeds, sore throat Cardiac:  No dizziness, chest pain or heaviness, chest tightness,edema Resp:   Denies cough or sputum porduction, shortness of breath,wheezing, hemoptysis,  Gi: Denies swallowing difficulty, stomach pain, nausea or vomiting, diarrhea, constipation, bowel incontinence Gu:  Denies bladder incontinence, burning urine Ext:   Denies Joint pain, stiffness or swelling Skin: Denies  skin rash, easy bruising or bleeding or hives Endoc:  Denies polyuria, polydipsia , polyphagia or weight change Psych:   Denies depression, insomnia or hallucinations   Other:  All other systems negative   VS: BP 119/65 (BP Location: Right Arm)   Pulse 81   Temp 98.4 F (36.9 C) (Oral)   Resp 18   Ht _0  (1.651 m)   Wt 70 kg   SpO2 94%   BMI 25.68 kg/m      PHYSICAL EXAM    GENERAL:NAD, no fevers, chills, no weakness no fatigue HEAD: Normocephalic, atraumatic.  EYES: Pupils equal, round, reactive to light. Extraocular muscles intact. No scleral icterus.  MOUTH: Moist mucosal membrane. Dentition intact. No abscess noted.  EAR, NOSE, THROAT: Clear without exudates. No external lesions.  NECK: Supple. No thyromegaly. No nodules. No JVD.  PULMONARY: mild velcro crepitations bilaterally CARDIOVASCULAR: S1 and S2. Regular rate and rhythm. No murmurs, rubs, or gallops. No edema. Pedal pulses 2+ bilaterally.  GASTROINTESTINAL: Soft, nontender, nondistended. No masses. Positive bowel sounds. No hepatosplenomegaly.  MUSCULOSKELETAL: No swelling, clubbing, or edema. Range of motion full in all extremities.  NEUROLOGIC: Cranial nerves II through XII are intact. No gross focal neurological deficits. Sensation intact. Reflexes intact.  SKIN: No ulceration, lesions, rashes, or cyanosis. Skin warm and dry. Turgor intact.  PSYCHIATRIC: Mood, affect within normal limits. The patient is awake, alert and oriented  x 3. Insight, judgment intact.       IMAGING    DG Chest 2 View  Result Date: 12/13/2020 CLINICAL DATA:  Dyspnea EXAM: CHEST - 2 VIEW COMPARISON:  12/04/2020 FINDINGS: Slight worsening extensive diffuse and bilateral reticulonodular  interstitial opacities. This may represent some component of superimposed edema or infection on chronic fibrotic lung disease. Heart is enlarged. No large effusion or pneumothorax. Low lung volumes. Aorta atherosclerotic and degenerative changes of the spine. IMPRESSION: Slight worsening diffuse interstitial opacities concerning for superimposed edema, less likely infection on background chronic fibrotic lung disease. No large effusion or pneumothorax Aortic Atherosclerosis (ICD10-I70.0). Electronically Signed   By: Jerilynn Mages.  Shick M.D.   On: 12/13/2020 10:54   CT Angio Chest PE W and/or Wo Contrast  Result Date: 12/05/2020 CLINICAL DATA:  Weakness, confusion, shortness of breath EXAM: CT ANGIOGRAPHY CHEST WITH CONTRAST TECHNIQUE: Multidetector CT imaging of the chest was performed using the standard protocol during bolus administration of intravenous contrast. Multiplanar CT image reconstructions and MIPs were obtained to evaluate the vascular anatomy. CONTRAST:  54m OMNIPAQUE IOHEXOL 350 MG/ML SOLN COMPARISON:  CT chest dated 11/03/2020 FINDINGS: Cardiovascular: Satisfactory opacification of the bilateral pulmonary arteries to the segmental level. No evidence of pulmonary embolism. Although not tailored for evaluation of the thoracic aorta, there is no evidence thoracic aortic aneurysm or dissection. The heart is top-normal in size.  No pericardial effusion. Coronary atherosclerosis of the LAD and left circumflex. Mediastinum/Nodes: Mild mediastinal lymphadenopathy, including a 15 mm short axis low right paratracheal node (series 5/image 113), likely reactive. Visualized thyroid is unremarkable. Lungs/Pleura: Subpleural reticulation/fibrosis in the lungs bilaterally, lower lobe  predominant with associated bronchiectasis. This appearance is similar to the recent prior, reflecting sequela of chronic interstitial lung disease. Progressive ground-glass in the bilateral lower lobes favor atelectasis due to poor inspiration. No suspicious pulmonary nodules. No focal consolidation. No pleural effusion or pneumothorax. Upper Abdomen: Visualized upper abdomen is notable for a 3.2 cm left upper pole renal cyst. Musculoskeletal: Mild degenerative changes of the visualized thoracolumbar spine. Review of the MIP images confirms the above findings. IMPRESSION: No evidence of pulmonary embolism. Chronic interstitial lung disease. Mild mediastinal lymphadenopathy, likely reactive. Aortic Atherosclerosis (ICD10-I70.0). Electronically Signed   By: SJulian HyM.D.   On: 12/05/2020 01:25   DG Chest Port 1 View  Result Date: 12/04/2020 CLINICAL DATA:  Weakness, confusion, shortness of breath EXAM: PORTABLE CHEST 1 VIEW COMPARISON:  CT chest dated 11/03/2020 FINDINGS: Subpleural reticulation/fibrosis in the lungs bilaterally. Associated lower lobe bronchiectasis. This appearance is unchanged from the prior, reflecting chronic interstitial lung disease. No superimposed consolidation.  No pleural effusion or pneumothorax. The heart is normal in size. IMPRESSION: Stable chronic interstitial lung disease. Electronically Signed   By: SJulian HyM.D.   On: 12/04/2020 23:22   ECHOCARDIOGRAM COMPLETE  Result Date: 12/09/2020    ECHOCARDIOGRAM REPORT   Patient Name:   RKARLYE IHRIGDate of Exam: 12/09/2020 Medical Rec #:  [id number]   Height:       65.0 in Accession #:    [id number]  Weight:       154.3 lb Date of Birth:  [birth year]   BSA:          1.772 m Patient Age:    89years     BP:           120/59 mmHg Patient Gender: F            HR:           70 bpm. Exam Location:  [facility] Procedure: 2D Echo, Cardiac Doppler, Color Doppler and Strain Analysis Indications:     Dyspnea R06.00  History:          Patient  has no prior history of Echocardiogram examinations.                  Risk Factors:Hypertension and Diabetes.  Sonographer:     Sherrie Sport Referring Phys:  [id number] Val Riles Diagnosing Phys: Ida Rogue MD  Sonographer Comments: Global longitudinal strain was attempted. IMPRESSIONS  1. Left ventricular ejection fraction, by estimation, is 60 to 65%. The left ventricle has normal function. The left ventricle has no regional wall motion abnormalities. Left ventricular diastolic parameters are consistent with Grade I diastolic dysfunction (impaired relaxation). The average left ventricular global longitudinal strain is -16.6 %. The global longitudinal strain is normal.  2. Right ventricular systolic function is normal. The right ventricular size is normal.  3. The aortic valve is normal in structure. Aortic valve regurgitation is not visualized. Mild to moderate aortic valve sclerosis/calcification is present, without any evidence of aortic stenosis. FINDINGS  Left Ventricle: Left ventricular ejection fraction, by estimation, is 60 to 65%. The left ventricle has normal function. The left ventricle has no regional wall motion abnormalities. The average left ventricular global longitudinal strain is -16.6 %. The global longitudinal strain is normal. The left ventricular internal cavity size was normal in size. There is no left ventricular hypertrophy. Left ventricular diastolic parameters are consistent with Grade I diastolic dysfunction (impaired relaxation). Right Ventricle: The right ventricular size is normal. No increase in right ventricular wall thickness. Right ventricular systolic function is normal. Left Atrium: Left atrial size was normal in size. Right Atrium: Right atrial size was normal in size. Pericardium: There is no evidence of pericardial effusion. Mitral Valve: The mitral valve is normal in structure. No evidence of mitral valve regurgitation. No evidence of mitral valve stenosis.  Tricuspid Valve: The tricuspid valve is normal in structure. Tricuspid valve regurgitation is mild . No evidence of tricuspid stenosis. Aortic Valve: The aortic valve is normal in structure. Aortic valve regurgitation is not visualized. Mild to moderate aortic valve sclerosis/calcification is present, without any evidence of aortic stenosis. Aortic valve mean gradient measures 11.0 mmHg.  Aortic valve peak gradient measures 18.5 mmHg. Aortic valve area, by VTI measures 2.11 cm. Pulmonic Valve: The pulmonic valve was normal in structure. Pulmonic valve regurgitation is not visualized. No evidence of pulmonic stenosis. Aorta: The aortic root is normal in size and structure. Venous: The inferior vena cava is normal in size with greater than 50% respiratory variability, suggesting right atrial pressure of 3 mmHg. IAS/Shunts: No atrial level shunt detected by color flow Doppler.  LEFT VENTRICLE PLAX 2D LVIDd:         3.90 cm  Diastology LVIDs:         2.40 cm  LV e' medial:    5.33 cm/s LV PW:         1.30 cm  LV E/e' medial:  13.9 LV IVS:        0.80 cm  LV e' lateral:   5.00 cm/s LVOT diam:     2.10 cm  LV E/e' lateral: 14.8 LV SV:         93 LV SV Index:   53       2D Longitudinal Strain LVOT Area:     3.46 cm 2D Strain GLS Avg:     -16.6 %                          3D Volume EF:  3D EF:        54 %                         LV EDV:       85 ml                         LV ESV:       39 ml                         LV SV:        46 ml RIGHT VENTRICLE RV Basal diam:  3.70 cm RV S prime:     21.60 cm/s TAPSE (M-mode): 3.2 cm LEFT ATRIUM             Index       RIGHT ATRIUM           Index LA diam:        3.50 cm 1.98 cm/m  RA Area:     21.30 cm LA Vol (A2C):   36.1 ml 20.38 ml/m RA Volume:   63.90 ml  36.07 ml/m LA Vol (A4C):   57.7 ml 32.57 ml/m LA Biplane Vol: 48.3 ml 27.26 ml/m  AORTIC VALVE                    PULMONIC VALVE AV Area (Vmax):    2.07 cm     RVOT Peak grad: 2 mmHg AV Area  (Vmean):   1.91 cm AV Area (VTI):     2.11 cm AV Vmax:           215.33 cm/s AV Vmean:          156.667 cm/s AV VTI:            0.441 m AV Peak Grad:      18.5 mmHg AV Mean Grad:      11.0 mmHg LVOT Vmax:         129.00 cm/s LVOT Vmean:        86.600 cm/s LVOT VTI:          0.269 m LVOT/AV VTI ratio: 0.61  AORTA Ao Root diam: 3.20 cm MITRAL VALVE                TRICUSPID VALVE MV Area (PHT): 3.02 cm     TR Peak grad:   22.3 mmHg MV Decel Time: 251 msec     TR Vmax:        236.00 cm/s MV E velocity: 74.10 cm/s MV A velocity: 106.00 cm/s  SHUNTS MV E/A ratio:  0.70         Systemic VTI:  0.27 m                             Systemic Diam: 2.10 cm Ida Rogue MD Electronically signed by Ida Rogue MD Signature Date/Time: 12/09/2020/5:40:42 PM    Final        CLINICAL DATA:  Weakness, confusion, shortness of breath   EXAM: CT ANGIOGRAPHY CHEST WITH CONTRAST   TECHNIQUE: Multidetector CT imaging of the chest was performed using the standard protocol during bolus administration of intravenous contrast. Multiplanar CT image reconstructions and MIPs were obtained to evaluate the vascular anatomy.   CONTRAST:  18m OMNIPAQUE IOHEXOL 350 MG/ML SOLN   COMPARISON:  CT chest dated  11/03/2020   FINDINGS: Cardiovascular: Satisfactory opacification of the bilateral pulmonary arteries to the segmental level. No evidence of pulmonary embolism.   Although not tailored for evaluation of the thoracic aorta, there is no evidence thoracic aortic aneurysm or dissection.   The heart is top-normal in size.  No pericardial effusion.   Coronary atherosclerosis of the LAD and left circumflex.   Mediastinum/Nodes: Mild mediastinal lymphadenopathy, including a 15 mm short axis low right paratracheal node (series 5/image 113), likely reactive.   Visualized thyroid is unremarkable.   Lungs/Pleura: Subpleural reticulation/fibrosis in the lungs bilaterally, lower lobe predominant with associated  bronchiectasis. This appearance is similar to the recent prior, reflecting sequela of chronic interstitial lung disease. Progressive ground-glass in the bilateral lower lobes favor atelectasis due to poor inspiration.   No suspicious pulmonary nodules.   No focal consolidation.   No pleural effusion or pneumothorax.   Upper Abdomen: Visualized upper abdomen is notable for a 3.2 cm left upper pole renal cyst.   Musculoskeletal: Mild degenerative changes of the visualized thoracolumbar spine.   Review of the MIP images confirms the above findings.   IMPRESSION: No evidence of pulmonary embolism.   Chronic interstitial lung disease.   Mild mediastinal lymphadenopathy, likely reactive.   Aortic Atherosclerosis (ICD10-I70.0).     Electronically Signed   By: Julian Hy M.D.   On: 12/05/2020 01:25   ASSESSMENT/PLAN   Acute on chronic hypoxemic respiratory failure -Patient with findings on CT chest with diffuse groundglass attenuation UIP pattern apical basal gradient suggestive of idiopathic pulmonary fibrosis with acute exacerbation. -We will initiate steroids, Solu-Medrol IV 40 daily -Require monitoring and increasing insulin coverage due to likely reactive hyperglycemia -There is associated bronchiectasis and traction bronchiolectasis -We will obtain respiratory sputum cultures as well as Fungitell serology -ANA comprehensive, CPK, ESR, CRP  -CTPE with no venous thromboembolism  COPD with Moderate persistent asthma -Steroids will dually help ILD and asthma at the same time -patient smoked from age 2 -4appx     Thank you for allowing me to participate in the care of this patient.  Total face to face encounter time for this patient visit was >69mn. >50% of the time was  spent in counseling and coordination of care.   Patient/Family are satisfied with care plan and all questions have been answered.  This document was prepared using Dragon voice recognition  software and may include unintentional dictation errors.     FOttie Glazier M.D.  [facility] of [facility]

## 2020-12-17 NOTE — Care Management Important Message (Signed)
Important Message  Patient Details  Name: Sierra Guzman MRN: [id number] Date of Birth: [birth year]   Medicare Important Message Given:  Yes     [name] 12/17/2020, 3:19 PM

## 2020-12-17 NOTE — Progress Notes (Signed)
Physical Therapy Treatment Patient Details Name: Sierra Guzman MRN: [id number] DOB: 1940-05-17 Today's Date: 12/17/2020    History of Present Illness Pt is an 80 y/o F admitted on 12/04/20 with c/c of DOE that has been worsening over the past couple days. Pt is being treated for acute on chronic hypoxic respiratory failure likely due to exacerbation of interstitial lung disease/interstitial pulmonary fibrosis. PMH: asthma, stage 3 CKD, CAD, DM2, gout, dyslipidemia, GERD    PT Comments    Pt was sitting in recliner upon arriving on 2 L o2 with sao2 96%. She agrees to session even with endorsing no sleep previous night. Pt is extremely motivated and cooperative throughout session. She was able to ambulate on less o2 (4 L) but does continue to desaturate to  83%. Recovers to 96% in 2 minutes 5 sec. Eventually able to return to 2 L with sao2  remaining > 90%. RN in room at conclusion of session. Per pt's request, Thereasa Parkin reach out to pt's daughter (regina) to give update. Pt still will greatly benefit form SNF at DC to improve activity tolerance while decreasing assistance with ADLs.    Follow Up Recommendations  SNF;Supervision for mobility/OOB     Equipment Recommendations  Other (comment) (defer to next level of care)       Precautions / Restrictions Precautions Precautions: Fall Restrictions Weight Bearing Restrictions: No    Mobility  Bed Mobility Overal bed mobility: Modified Independent    General bed mobility comments: in recliner/pre/post session    Transfers Overall transfer level: Needs assistance Equipment used: Rolling walker (2 wheeled) Transfers: Sit to/from Stand Sit to Stand: Supervision         General transfer comment: pt was on 2 L o2 upon arrivig however had to be increased to 4 L during ambulation. desaturates to 83% but recovers to 96% at 2 minutes. was able to wean back to 2 L by the conclusion of session  Ambulation/Gait Ambulation/Gait assistance:  Supervision Gait Distance (Feet): 40 Feet Assistive device: Rolling walker (2 wheeled) Gait Pattern/deviations: Step-through pattern Gait velocity: decreased   General Gait Details: Pt was able to ambulate 40 ft with RW + supervision. constant vcs for improve breathing techniques. pt goes continue to be SOB with minimal activity     Balance Overall balance assessment: Needs assistance Sitting-balance support: Bilateral upper extremity supported Sitting balance-Leahy Scale: Normal     Standing balance support: Bilateral upper extremity supported;During functional activity Standing balance-Leahy Scale: Good      Cognition Arousal/Alertness: Awake/alert Behavior During Therapy: WFL for tasks assessed/performed Overall Cognitive Status: Within Functional Limits for tasks assessed        General Comments: Pt is extremely motivated and pleasant. A and O x 4 + good historian of past medical hx       Pertinent Vitals/Pain Pain Assessment: No/denies pain     PT Goals (current goals can now be found in the care plan section) Acute Rehab PT Goals Patient Stated Goal: "get stronger at rehab then go home." Progress towards PT goals: Progressing toward goals (continues to be limited by poor activity tolerance/ desaturation with minimal ambulation)    Frequency    Min 2X/week      PT Plan Current plan remains appropriate       AM-PAC PT "6 Clicks" Mobility   Outcome Measure  Help needed turning from your back to your side while in a flat bed without using bedrails?: A Little Help needed moving from lying on your  back to sitting on the side of a flat bed without using bedrails?: A Little Help needed moving to and from a bed to a chair (including a wheelchair)?: A Little Help needed standing up from a chair using your arms (e.g., wheelchair or bedside chair)?: A Little Help needed to walk in hospital room?: A Little Help needed climbing 3-5 steps with a railing? : A Little 6  Click Score: 18    End of Session Equipment Utilized During Treatment: Gait belt;Oxygen Activity Tolerance: Patient tolerated treatment well Patient left: in chair;with call bell/phone within reach;with chair alarm set Nurse Communication: Mobility status PT Visit Diagnosis: Muscle weakness (generalized) (M62.81)     Time: 6578-4696 PT Time Calculation (min) (ACUTE ONLY): 32 min  Charges:  $Gait Training: 8-22 mins $Therapeutic Activity: 8-22 mins                    Jetta Lout PTA 12/17/20, 1:50 PM

## 2020-12-17 NOTE — TOC Progression Note (Addendum)
Transition of Care [facility]) - Progression Note    Patient Details  Name: Sierra Guzman MRN: [id number] Date of Birth: 1941-02-23  Transition of Care [facility]) CM/SW Contact  Liliana Cline, LCSW Phone Number: 12/17/2020, 9:01 AM  Clinical Narrative:   Left VM for Debbie at [place] requesting return call with update on insurance authorization status.   9:10- Call from [place] who stated they do not have auth yet. She will keep TOC updated over the weekend.   2:25- Update from [place]. [facility] has denied SNF. Option for Peer to Peer, notified MD.  [phone] option 3  3:35- Per MD, Peer to Peer approved. Patient may be ready over the weekend, latest would be Monday. Updated Debbie with [facility] who confirms she can take patient over the weekend if medically ready.   Expected Discharge Plan: Home w Home Health Services Barriers to Discharge: Continued Medical Work up  Expected Discharge Plan and Services Expected Discharge Plan: Home w Home Health Services   Discharge Planning Services: CM Consult   Living arrangements for the past 2 months: Single Family Home                           HH Arranged: RN, PT, OT [facility] Agency: [facility] Date [facility] Agency Contacted: 12/07/20   Representative spoke with at [facility] Agency: [place]   Social Determinants of Health (SDOH) Interventions    Readmission Risk Interventions No flowsheet data found.

## 2020-12-17 NOTE — Progress Notes (Signed)
Mobility Specialist - Progress Note   12/17/20 1432  Mobility  Activity Transferred to/from [facility]  Level of Assistance Standby assist, set-up cues, supervision of patient - no hands on  Assistive Device None  Distance Ambulated (ft) 2 ft  Mobility Out of bed for toileting  Mobility Response Tolerated well  Mobility performed by Mobility specialist  $Mobility charge 1 Mobility    Pt transferred chair-BSC-chair with supervision.    Filiberto Pinks Mobility Specialist 12/17/20, 2:33 PM

## 2020-12-17 NOTE — Progress Notes (Signed)
PROGRESS NOTE    Sierra Guzman  HUD:[id number] DOB: 11/12/1940 DOA: 12/04/2020 PCP: System, Provider Not In   Chief Complain: Dyspnea on exertion  Brief Narrative:  Patient is a 80 year old female with history of ILD/pulmonary fibrosis on 2 L of oxygen per minute at rest and 5 L on ambulation, coronary disease, diabetes type 2, gout, hyperlipidemia, GERD, CKD stage III who presented with worsening dyspnea on exertion.  Patient was admitted for the management of acute on chronic respiratory failure with hypoxia from ILD flare.  Pulmonology following, currently on IV steroids.  PT/OT recommended skilled nursing facility on discharge.  She is waiting for bed availability.  Currently on baseline oxygen requirement  Assessment & Plan:   Principal Problem:   Acute on chronic respiratory failure with hypoxemia (HCC) Active Problems:   Hyperlipidemia with target LDL less than 100   Hypertension   Stage 3b chronic kidney disease (HCC)   Acute respiratory failure with hypoxia (HCC)   Acute on chronic hypoxic respiratory failure: Secondary to ILD/pulmonary fibrosis exacerbation.  CT chest showed diffuse groundglass attenuation so stable for diabetic pulmonary fibrosis with acute exacerbation.  There is associated bronchiectasis.  CT angiogram did not show any PE Respiratory status has improved today, but she still looks mildly dyspneic.  Currently maintaining her saturation on 2 L of oxygen at rest which is her baseline.  She is on daily  IV Lasix 20 mg.  PCCM is following.  Also on steroid 40 mg daily IV.  Will change change to oral on discharge. She will follow-up with pulmonology as an outpatient.  History of COPD/moderate persistent asthma: Continue steroids.  She is a past smoker.  Continue bronchodilator as needed, inhalers  CKD stage IIIb: Baseline creatinine 1.2.  Currently kidney function at baseline.  Chronic normocytic anemia: She has required transfusion therapy, IV iron/oral iron  supplementation.  Likely associated  with chronic disease.  Currently hemoglobin stable  Hypertension: Blood pressures currently well controlled.  On candesartan, metoprolol at home.  Currently on irbesartan  Diabetes type 2: Recent hemoglobin A1c of 8.1.  It has worsened from 5.4% as per February 2022.  Most likely secondary to steroids.  Continue current insulin regimen.  Monitor blood sugars  Hyperlipidemia: On Crestor at home  GERD: Continue PPI  Gout: Without evidence of exacerbation.  Takes allopurinol as an outpatient           DVT prophylaxis:Lovenox Code Status: Full Family Communication: None at the bedside Status is: Inpatient  Remains inpatient appropriate because:Unsafe d/c plan  Dispo: The patient is from: Home              Anticipated d/c is to: SNF              Patient currently is not medically stable to d/c.   Difficult to place patient No     Consultants: PCCM  Procedures: None  Antimicrobials:  Anti-infectives (From admission, onward)    None       Subjective: Patient seen and examined at the bedside this morning.  Hemodynamically stable during my evaluation.  She was sitting on the chair, eating her breakfast.  She was on 2 L of oxygen per minute.  Denies any worsening shortness of breath or cough.  She feels better today.  She lives alone at home.  Objective: Vitals:   12/16/20 1943 12/17/20 0510 12/17/20 0730 12/17/20 0750  BP: 116/60 (!) 116/50 119/65   Pulse: 70 75 81   Resp: 18 18  18   Temp: 98.4 F (36.9 C) 98.3 F (36.8 C) 98.4 F (36.9 C)   TempSrc: Oral  Oral   SpO2: 99% 96% 96% 94%  Weight:      Height:        Intake/Output Summary (Last 24 hours) at 12/17/2020 1144 Last data filed at 12/17/2020 1009 Gross per 24 hour  Intake 240 ml  Output 1500 ml  Net -1260 ml   Filed Weights   12/05/20 0540  Weight: 70 kg    Examination:  General exam: Overall comfortable, not in distress, pleasant elderly female HEENT:  PERRL Respiratory system: Mild decreased air entry on both sides, no obvious wheezes or crackles  Cardiovascular system: S1 & S2 heard, RRR.  Gastrointestinal system: Abdomen is nondistended, soft and nontender. Central nervous system: Alert and oriented Extremities: No edema, no clubbing ,no cyanosis Skin: No rashes, no ulcers,no icterus      Data Reviewed: I have personally reviewed following labs and imaging studies  CBC: Recent Labs  Lab 12/11/20 0942 12/13/20 1340  WBC 12.6* 11.3*  HGB 8.0* 8.2*  HCT 26.2* 26.9*  MCV 89.7 91.2  PLT 287 224   Basic Metabolic Panel: Recent Labs  Lab 12/11/20 0942 12/13/20 1340 12/14/20 0513 12/17/20 0824  NA 139 137 139 136  K 4.1 3.6 4.0 4.0  CL 107 109 108 104  CO2 24 24 27 26   GLUCOSE 187* 164* 109* 139*  BUN 18 11 9 11   CREATININE 1.20* 1.09* 1.20* 1.13*  CALCIUM 8.7* 8.8* 8.6* 9.3  MG 2.2 1.9  --   --   PHOS 3.2 2.0* 4.3  --    GFR: Estimated Creatinine Clearance: 38.3 mL/min (A) (by C-G formula based on SCr of 1.13 mg/dL (H)). Liver Function Tests: Recent Labs  Lab 12/14/20 0513 12/17/20 0824  AST  --  22  ALT  --  9  ALKPHOS  --  49  BILITOT  --  0.5  PROT  --  6.1*  ALBUMIN 2.4* 2.8*   No results for input(s): LIPASE, AMYLASE in the last 168 hours. No results for input(s): AMMONIA in the last 168 hours. Coagulation Profile: No results for input(s): INR, PROTIME in the last 168 hours. Cardiac Enzymes: Recent Labs  Lab 12/15/20 1801  CKTOTAL 36*   BNP (last 3 results) No results for input(s): PROBNP in the last 8760 hours. HbA1C: No results for input(s): HGBA1C in the last 72 hours. CBG: Recent Labs  Lab 12/16/20 1209 12/16/20 1358 12/16/20 1637 12/16/20 2044 12/17/20 0731  GLUCAP 247* 188* 85 95 135*   Lipid Profile: No results for input(s): CHOL, HDL, LDLCALC, TRIG, CHOLHDL, LDLDIRECT in the last 72 hours. Thyroid Function Tests: No results for input(s): TSH, T4TOTAL, FREET4, T3FREE,  THYROIDAB in the last 72 hours. Anemia Panel: No results for input(s): VITAMINB12, FOLATE, FERRITIN, TIBC, IRON, RETICCTPCT in the last 72 hours. Sepsis Labs: Recent Labs  Lab 12/15/20 1801  PROCALCITON 0.17    Recent Results (from the past 240 hour(s))  MRSA Next Gen by PCR, Nasal     Status: None   Collection Time: 12/15/20  6:28 PM   Specimen: Nasal Mucosa; Nasal Swab  Result Value Ref Range Status   MRSA by PCR Next Gen NOT DETECTED NOT DETECTED Final    Comment: (NOTE) The GeneXpert MRSA Assay (FDA approved for NASAL specimens only), is one component of a comprehensive MRSA colonization surveillance program. It is not intended to diagnose MRSA infection nor to guide  or monitor treatment for MRSA infections. Test performance is not FDA approved in patients less than 29 years old. Performed at [facility], [address] [place], [place] [address]          Radiology Studies: No results found.      Scheduled Meds:  docusate sodium  100 mg Oral Daily   enoxaparin (LOVENOX) injection  40 mg Subcutaneous Q24H   furosemide  20 mg Intravenous Daily   hydrocortisone cream   Topical BID   insulin aspart  0-20 Units Subcutaneous TID AC & HS   insulin aspart  3 Units Subcutaneous TID WC   irbesartan  300 mg Oral Daily   loratadine  10 mg Oral Daily   methylPREDNISolone (SOLU-MEDROL) injection  40 mg Intravenous Q24H   mometasone-formoterol  2 puff Inhalation BID   pantoprazole  40 mg Oral Daily   polyethylene glycol  17 g Oral Daily   rosuvastatin  40 mg Oral Daily   umeclidinium bromide  1 puff Inhalation Daily   Vitamin D (Ergocalciferol)  50,000 Units Oral Q7 days   Continuous Infusions:   LOS: 12 days    Time spent: 35 mins.More than 50% of that time was spent in counseling and/or coordination of care.      Burnadette Pop, MD [facility] P9/12/2020, 11:44 AM

## 2020-12-18 LAB — BASIC METABOLIC PANEL
Anion gap: 3 — ABNORMAL LOW (ref 5–15)
BUN: 12 mg/dL (ref 8–23)
CO2: 29 mmol/L (ref 22–32)
Calcium: 8.9 mg/dL (ref 8.9–10.3)
Chloride: 106 mmol/L (ref 98–111)
Creatinine, Ser: 1.06 mg/dL — ABNORMAL HIGH (ref 0.44–1.00)
GFR, Estimated: 53 mL/min — ABNORMAL LOW (ref >60)
Glucose, Bld: 117 mg/dL — ABNORMAL HIGH (ref 70–99)
Potassium: 3.6 mmol/L (ref 3.5–5.1)
Sodium: 138 mmol/L (ref 135–145)

## 2020-12-18 LAB — CBC WITH DIFFERENTIAL/PLATELET
Abs Immature Granulocytes: 0.06 10*3/uL (ref 0.00–0.07)
Basophils Absolute: 0 10*3/uL (ref 0.0–0.1)
Basophils Relative: 0 %
Eosinophils Absolute: 0.2 10*3/uL (ref 0.0–0.5)
Eosinophils Relative: 1 %
HCT: 24.3 % — ABNORMAL LOW (ref 36.0–46.0)
Hemoglobin: 7.6 g/dL — ABNORMAL LOW (ref 12.0–15.0)
Immature Granulocytes: 1 %
Lymphocytes Relative: 24 %
Lymphs Abs: 2.8 10*3/uL (ref 0.7–4.0)
MCH: 28.3 pg (ref 26.0–34.0)
MCHC: 31.3 g/dL (ref 30.0–36.0)
MCV: 90.3 fL (ref 80.0–100.0)
Monocytes Absolute: 0.9 10*3/uL (ref 0.1–1.0)
Monocytes Relative: 8 %
Neutro Abs: 7.9 10*3/uL — ABNORMAL HIGH (ref 1.7–7.7)
Neutrophils Relative %: 66 %
Platelets: 221 10*3/uL (ref 150–400)
RBC: 2.69 MIL/uL — ABNORMAL LOW (ref 3.87–5.11)
RDW: 21.6 % — ABNORMAL HIGH (ref 11.5–15.5)
WBC: 11.9 10*3/uL — ABNORMAL HIGH (ref 4.0–10.5)
nRBC: 0 % (ref 0.0–0.2)

## 2020-12-18 LAB — GLUCOSE, CAPILLARY
Glucose-Capillary: 118 mg/dL — ABNORMAL HIGH (ref 70–99)
Glucose-Capillary: 233 mg/dL — ABNORMAL HIGH (ref 70–99)
Glucose-Capillary: 147 mg/dL — ABNORMAL HIGH (ref 70–99)
Glucose-Capillary: 134 mg/dL — ABNORMAL HIGH (ref 70–99)

## 2020-12-18 LAB — PHOSPHORUS: Phosphorus: 3.4 mg/dL (ref 2.5–4.6)

## 2020-12-18 LAB — RESP PANEL BY RT-PCR (FLU A&B, COVID) ARPGX2
Influenza A by PCR: NEGATIVE
Influenza B by PCR: NEGATIVE
SARS Coronavirus 2 by RT PCR: NEGATIVE

## 2020-12-18 LAB — FUNGITELL, SERUM: Fungitell Result: <31 pg/mL (ref <80)

## 2020-12-18 MED ORDER — PREDNISONE 20 MG PO TABS
40.0000 mg | ORAL_TABLET | Freq: Every day | ORAL | Status: DC
  Administered 2020-12-19: 40 mg via ORAL
  Filled 2020-12-18: qty 2

## 2020-12-18 MED ORDER — FUROSEMIDE 20 MG PO TABS
20.0000 mg | ORAL_TABLET | Freq: Every day | ORAL | Status: DC
  Administered 2020-12-18 – 2020-12-19 (×2): 20 mg via ORAL
  Filled 2020-12-18 (×2): qty 1

## 2020-12-18 MED ORDER — FERROUS SULFATE 325 (65 FE) MG PO TABS
325.0000 mg | ORAL_TABLET | Freq: Every day | ORAL | Status: DC
  Administered 2020-12-18 – 2020-12-19 (×2): 325 mg via ORAL
  Filled 2020-12-18 (×2): qty 1

## 2020-12-18 NOTE — Progress Notes (Signed)
Mobility Specialist - Progress Note   12/18/20 1100  Mobility  Activity Ambulated in room  Level of Assistance Standby assist, set-up cues, supervision of patient - no hands on  Assistive Device None;Front wheel walker  Distance Ambulated (ft) 100 ft  Mobility Ambulated with assistance in room  Mobility Response Tolerated well  Mobility performed by Mobility specialist  $Mobility charge 1 Mobility    Pt ambulated in room, 2 x 40' with RW. Utilizing 6L for OOB activity. O2 desat to a low of 83% during first trial with 2 minute recovery, low of 86% during second trial with 45 second recovery. Pt then ambulated 2 x 10' without RW. No LOB minGuard. O2 maintained > 92% on both bouts of ambulation without AD.     Filiberto Pinks Mobility Specialist 12/18/20, 11:45 AM

## 2020-12-18 NOTE — TOC Progression Note (Addendum)
Transition of Care [facility]) - Progression Note    Patient Details  Name: Sierra Guzman MRN: [id number] Date of Birth: December 06, 1940  Transition of Care [facility]) CM/SW Contact  Liliana Cline, LCSW Phone Number: 12/18/2020, 11:26 AM  Clinical Narrative:   Per MD, plan to DC to SNF tomorrow. Notified Debbie with [employer].  Patient will need a new COVID test. Notified MD and RN. Updated patient's daughter [name].[name] confirms patient has had both COVID boosters.     Expected Discharge Plan: Home w Home Health Services Barriers to Discharge: Continued Medical Work up  Expected Discharge Plan and Services Expected Discharge Plan: Home w Home Health Services   Discharge Planning Services: CM Consult   Living arrangements for the past 2 months: Single Family Home                           HH Arranged: RN, PT, OT [facility] Agency: [facility] Date [facility] Agency Contacted: 12/07/20   Representative spoke with at [facility] Agency: [place]   Social Determinants of Health (SDOH) Interventions    Readmission Risk Interventions No flowsheet data found.

## 2020-12-18 NOTE — Progress Notes (Signed)
PROGRESS NOTE    ADDILYNE Guzman  WUJ:[id number] DOB: 05-18-1940 DOA: 12/04/2020 PCP: System, Provider Not In   Chief Complain: Dyspnea on exertion  Brief Narrative:  Patient is a 80 year old female with history of ILD/pulmonary fibrosis on 2 L of oxygen per minute at rest and 5 L on ambulation, coronary disease, diabetes type 2, gout, hyperlipidemia, GERD, CKD stage III who presented with worsening dyspnea on exertion.  Patient was admitted for the management of acute on chronic respiratory failure with hypoxia from ILD flare.  Pulmonology following, currently on IV steroids.  PT/OT recommended skilled nursing facility on discharge.    Currently on baseline oxygen requirement.  IV Lasix and IV steroids changed to oral today.  Plan for discharge to skilled nursing facility tomorrow.  Assessment & Plan:   Principal Problem:   Acute on chronic respiratory failure with hypoxemia (HCC) Active Problems:   Hyperlipidemia with target LDL less than 100   Hypertension   Stage 3b chronic kidney disease (HCC)   Acute respiratory failure with hypoxia (HCC)   Acute on chronic hypoxic respiratory failure: Secondary to ILD/pulmonary fibrosis exacerbation.  CT chest showed diffuse groundglass attenuation so stable for diabetic pulmonary fibrosis with acute exacerbation.  There is associated bronchiectasis.  CT angiogram did not show any PE.  Currently maintaining her saturation on 2 L of oxygen at rest which is her baseline.  She is on daily  IV Lasix 20 mg,changed to oral.  PCCM is following.  Also on steroid 40 mg daily IV, changed to oral  she will follow-up with pulmonology as an outpatient.  History of COPD/moderate persistent asthma:   She is a past smoker.  Continue bronchodilator as needed, inhalers  CKD stage IIIb: Baseline creatinine 1.2.  Currently kidney function at baseline.  Chronic normocytic anemia: She has required transfusion therapy, IV iron/oral iron supplementation.  Hemoglobin in  the range of 7.  Her iron studies showed low iron.  She was given IV iron infusion during this hospitalization.  Continue oral supplementation on discharge  Hypertension: Blood pressures currently well controlled.  On candesartan, metoprolol at home.  Currently on irbesartan  Diabetes type 2: Recent hemoglobin A1c of 8.1.  It has worsened from 5.4% as per February 2022.  Most likely secondary to steroids.  Continue current insulin regimen.  Monitor blood sugars  Hyperlipidemia: On Crestor at home  GERD: Continue PPI  Gout: Without evidence of exacerbation.  Takes allopurinol as an outpatient  Debility/deconditioning: PT/OT recommended skilled nursing facility on discharge          DVT prophylaxis:Lovenox Code Status: Full Family Communication: None at the bedside Status is: Inpatient  Remains inpatient appropriate because:Unsafe d/c plan  Dispo: The patient is from: Home              Anticipated d/c is to: SNF              Patient currently is not medically stable to d/c.   Difficult to place patient No     Consultants: PCCM  Procedures: None  Antimicrobials:  Anti-infectives (From admission, onward)    None       Subjective:  Patient seen and examined at the bedside this morning.  Hemodynamically stable during evaluation.  She feels better today.  Not complaining of worsening cough or shortness of breath.  On baseline oxygen requirement.  Eager to be discharged..  Objective: Vitals:   12/17/20 0750 12/17/20 1526 12/18/20 0521 12/18/20 0802  BP:  111/69 Sierra Guzman)  97/51 (!) 105/55  Pulse:  97 75 76  Resp:  20 18 (!) 24  Temp:  98.3 F (36.8 C) 98.2 F (36.8 C) 98.2 F (36.8 C)  TempSrc:  Oral Oral Oral  SpO2: 94% 100% 95% 95%  Weight:      Height:        Intake/Output Summary (Last 24 hours) at 12/18/2020 0837 Last data filed at 12/18/2020 0540 Gross per 24 hour  Intake 360 ml  Output 400 ml  Net -40 ml   Filed Weights   12/05/20 0540  Weight: 70 kg     Examination:  General exam: Overall comfortable, not in distress, pleasant elderly female HEENT: PERRL Respiratory system: Diminished air sounds bilaterally, no wheezes or crackles  Cardiovascular system: S1 & S2 heard, RRR.  Gastrointestinal system: Abdomen is nondistended, soft and nontender. Central nervous system: Alert and oriented Extremities: No edema, no clubbing ,no cyanosis Skin: No rashes, no ulcers,no icterus      Data Reviewed: I have personally reviewed following labs and imaging studies  CBC: Recent Labs  Lab 12/11/20 0942 12/13/20 1340 12/18/20 0446  WBC 12.6* 11.3* 11.9*  NEUTROABS  --   --  7.9*  HGB 8.0* 8.2* 7.6*  HCT 26.2* 26.9* 24.3*  MCV 89.7 91.2 90.3  PLT 287 224 221   Basic Metabolic Panel: Recent Labs  Lab 12/11/20 0942 12/13/20 1340 12/14/20 0513 12/17/20 0824 12/18/20 0446  NA 139 137 139 136 138  K 4.1 3.6 4.0 4.0 3.6  CL 107 109 108 104 106  CO2 24 24 27 26 29   GLUCOSE 187* 164* 109* 139* 117*  BUN 18 11 9 11 12   CREATININE 1.20* 1.09* 1.20* 1.13* 1.06*  CALCIUM 8.7* 8.8* 8.6* 9.3 8.9  MG 2.2 1.9  --   --   --   PHOS 3.2 2.0* 4.3  --  3.4   GFR: Estimated Creatinine Clearance: 40.9 mL/min (A) (by C-G formula based on SCr of 1.06 mg/dL (H)). Liver Function Tests: Recent Labs  Lab 12/14/20 0513 12/17/20 0824  AST  --  22  ALT  --  9  ALKPHOS  --  49  BILITOT  --  0.5  PROT  --  6.1*  ALBUMIN 2.4* 2.8*   No results for input(s): LIPASE, AMYLASE in the last 168 hours. No results for input(s): AMMONIA in the last 168 hours. Coagulation Profile: No results for input(s): INR, PROTIME in the last 168 hours. Cardiac Enzymes: Recent Labs  Lab 12/15/20 1801  CKTOTAL 36*   BNP (last 3 results) No results for input(s): PROBNP in the last 8760 hours. HbA1C: No results for input(s): HGBA1C in the last 72 hours. CBG: Recent Labs  Lab 12/17/20 0731 12/17/20 1234 12/17/20 1704 12/17/20 2057 12/18/20 0748  GLUCAP  135* 144* 180* 104* 118*   Lipid Profile: No results for input(s): CHOL, HDL, LDLCALC, TRIG, CHOLHDL, LDLDIRECT in the last 72 hours. Thyroid Function Tests: No results for input(s): TSH, T4TOTAL, FREET4, T3FREE, THYROIDAB in the last 72 hours. Anemia Panel: No results for input(s): VITAMINB12, FOLATE, FERRITIN, TIBC, IRON, RETICCTPCT in the last 72 hours. Sepsis Labs: Recent Labs  Lab 12/15/20 1801  PROCALCITON 0.17    Recent Results (from the past 240 hour(s))  MRSA Next Gen by PCR, Nasal     Status: None   Collection Time: 12/15/20  6:28 PM   Specimen: Nasal Mucosa; Nasal Swab  Result Value Ref Range Status   MRSA by PCR Next Gen NOT  DETECTED NOT DETECTED Final    Comment: (NOTE) The GeneXpert MRSA Assay (FDA approved for NASAL specimens only), is one component of a comprehensive MRSA colonization surveillance program. It is not intended to diagnose MRSA infection nor to guide or monitor treatment for MRSA infections. Test performance is not FDA approved in patients less than 76 years old. Performed at [facility], [address], [place], [place] [address]          Radiology Studies: DG Chest [place] 1 View  Result Date: 12/17/2020 CLINICAL DATA:  Interstitial edema EXAM: PORTABLE CHEST 1 VIEW COMPARISON:  Chest radiograph 12/13/2020 FINDINGS: The cardiomediastinal silhouette is stable. There are patchy and interstitial opacities throughout both lungs. Aeration of the left mid lung has slightly improved, while aeration of the remainder of the lungs is not significantly changed. There is no significant pleural effusion. There is no pneumothorax. There is no acute osseous abnormality. IMPRESSION: Patchy and interstitial opacities throughout both lungs with slight interval improvement in aeration of the left mid lung. Electronically Signed   By: Lesia Hausen M.D.   On: 12/17/2020 16:52        Scheduled Meds:  docusate sodium  100 mg Oral Daily   enoxaparin  (LOVENOX) injection  40 mg Subcutaneous Q24H   furosemide  20 mg Oral Daily   hydrocortisone cream   Topical BID   insulin aspart  0-20 Units Subcutaneous TID AC & HS   insulin aspart  3 Units Subcutaneous TID WC   irbesartan  300 mg Oral Daily   loratadine  10 mg Oral Daily   methylPREDNISolone (SOLU-MEDROL) injection  40 mg Intravenous Q24H   mometasone-formoterol  2 puff Inhalation BID   pantoprazole  40 mg Oral Daily   polyethylene glycol  17 g Oral Daily   rosuvastatin  40 mg Oral Daily   umeclidinium bromide  1 puff Inhalation Daily   Vitamin D (Ergocalciferol)  50,000 Units Oral Q7 days   Continuous Infusions:   LOS: 13 days    Time spent: 35 mins.More than 50% of that time was spent in counseling and/or coordination of care.      Burnadette Pop, MD [facility] P9/01/2021, 8:37 AM

## 2020-12-18 NOTE — Progress Notes (Signed)
Pulmonary Medicine          Date: 12/18/2020,   MRN# [id number] Sierra Guzman October 01, 1940     AdmissionWeight: 34 kg                 CurrentWeight: 70 kg    Referring physician: Dr. Wyline Copas  CHIEF COMPLAINT:   Refractory pneumonia concern for interstitial lung disease   HISTORY OF PRESENT ILLNESS   Pleasant 80 year old female with history of moderate persistent asthma, CKD CAD, type 2 diabetes, gout, dyslipidemia and GERD, came into the ER initially with complaints of dyspnea progressively getting worse over 24 to 72 hours.  She generally uses 2 L supplemental oxygen but noted her SPO2 was decreasing to mid 80s and had to increase her oxygen over 5 L to reach normoxia.  She does have cough which is chronic and sometimes wheezing which is related to her asthma.  She denies flulike illness, COVID contacts, nausea vomiting diarrhea, hemoptysis or hematuria.  Upon arrival to the ER she was acutely hypoxemic with tachypnea had hyperglycemia and decreased renal function with creatinine 1.3 as well as hypoalbuminemia with albumin 2.9 her BNP was not significantly elevated at 87 and high-sensitivity troponin was within reference range for CKD patients, she did have anemia with a hemoglobin of 8.4 which is chronic and a UA did show high amounts of glucose in the urine.  Chest imaging with pictorial documentation is included below and was independently reviewed by me.  PCCM consultation was placed for further evaluation management of patient with possible interstitial lung disease refractory to therapy.  9/822- Patient is improved she is down to 2l/min [place] and feels slightly better. Plan to continue steroids for now and increase BPH with metaneb   12/17/20- patient is now on home O2 requirement , she is waiting of CIR approval for physical therapy.   12/18/20- patient is stable, she is cleared for dc home from pulmonary perspective with outapatient follow up.  PAST MEDICAL HISTORY   Past  Medical History:  Diagnosis Date  . Anemia 10/28/2010   unspecified  . Anxiety   . Aortic valve sclerosis   . Arthritis   . Asthma   . Chronic kidney disease    Chronic Kidney Disease---Stage III  . Coronary artery disease 10/28/2010  . DDD (degenerative disc disease), lumbar 2006  . Diabetes mellitus without complication ([place])   . Diabetes type 2, controlled ([place]) 10/28/2010  . Dyspnea   . GERD (gastroesophageal reflux disease)   . Gout 10/28/2010  . Heart abnormality   . Heart murmur   . History of cardiac catheterization 2006   LAD 50%, D1 50% rest normal.   . History of cardiovascular stress test 09/23/2009    lexiscan Normal Cardiolite test, EF 61%. LV size and function - normal  . History of echocardiogram 05/27/2008   EF 55%; Mild aortic insufficiency with minimal aortic sclerosis. L vent normal.   . Hyperlipidemia   . Hyperlipidemia LDL goal <100 10/28/2010   managed by Dr. Dorena Bodo  . Hypertension 10/28/2010  . Interstitial pneumonia ([place])    unspecified  . Mild vitamin D deficiency 02/22/2011  . Obesity (BMI 30-39.9) 10/28/2010   unspecified  . 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 Personal history of colonic polyps 10/28/2010  . Shortness of breath   . Syncope 2013  . Vitamin B 12 deficiency 10/28/2010     SURGICAL HISTORY   Past Surgical History:  Procedure Laterality  Date  . BACK SURGERY  2006   DDD  . BREAST BIOPSY Right    neg  . CARDIAC CATHETERIZATION  2006  . EYE SURGERY Bilateral    Cataract Extraction with IOL  . JOINT REPLACEMENT Left    Left Total Hip Replacement  . JOINT REPLACEMENT     RIGHT 2005; LEFT 2 X 2004, 2011  . REPLACEMENT TOTAL KNEE Left   . REVISION TOTAL KNEE ARTHROPLASTY Left    Dr. Marry Guan  . TONSILLECTOMY    . TOTAL HIP ARTHROPLASTY Right 12/12/2016   Procedure: TOTAL HIP ARTHROPLASTY ANTERIOR APPROACH;  Surgeon: Hessie Knows, MD;  Location: [facility];  Service: Orthopedics;  Laterality: Right;  .  TOTAL HIP ARTHROPLASTY Left 12/26/2013   Procedure: ARTHROPLASTY HIP TOTAL; Surgeon: Zorita Pang, MD; Location: [place]; Service: Orthopedics; Laterality: left,   . TOTAL KNEE ARTHROPLASTY     both knees     FAMILY HISTORY   Family History  Problem Relation Age of Onset  . Breast cancer Mother 77  . Diabetes Mother   . Stroke Mother   . Stroke Brother   . Breast cancer Cousin        pat cousin     SOCIAL HISTORY   Social History   Tobacco Use  . Smoking status: Former    Packs/day: 0.25    Years: 10.00    Pack years: 2.50    Types: Cigarettes    Quit date: 08/18/1990    Years since quitting: 30.3  . Smokeless tobacco: Never  Vaping Use  . Vaping Use: Never used  Substance Use Topics  . Alcohol use: No  . Drug use: No     MEDICATIONS    Home Medication:    Current Medication:  Current Facility-Administered Medications:  .  acetaminophen (TYLENOL) tablet 650 mg, 650 mg, Oral, Q6H PRN, 650 mg at 12/13/20 2112 **OR** acetaminophen (TYLENOL) suppository 650 mg, 650 mg, Rectal, Q6H PRN, Mansy, Jan A, MD .  bisacodyl (DULCOLAX) EC tablet 10 mg, 10 mg, Oral, Daily PRN, Val Riles, MD .  bisacodyl (DULCOLAX) suppository 10 mg, 10 mg, Rectal, Daily PRN, Val Riles, MD .  docusate sodium (COLACE) capsule 100 mg, 100 mg, Oral, Daily, Val Riles, MD, 100 mg at 12/18/20 0941 .  enoxaparin (LOVENOX) injection 40 mg, 40 mg, Subcutaneous, Q24H, Mansy, Jan A, MD, 40 mg at 12/18/20 0941 .  ferrous sulfate tablet 325 mg, 325 mg, Oral, Q breakfast, Adhikari, Amrit, MD, 325 mg at 12/18/20 1208 .  furosemide (LASIX) tablet 20 mg, 20 mg, Oral, Daily, Adhikari, Amrit, MD, 20 mg at 12/18/20 0942 .  gabapentin (NEURONTIN) capsule 300 mg, 300 mg, Oral, TID PRN, Val Riles, MD, 300 mg at 12/18/20 0959 .  guaiFENesin-dextromethorphan (ROBITUSSIN DM) 100-10 MG/5ML syrup 5 mL, 5 mL, Oral, Q4H PRN, Little Ishikawa, MD, 5 mL at 12/12/20 1725 .  hydrocortisone cream  1 %, , Topical, BID, Val Riles, MD, Given at 12/18/20 [phone] .  hydrOXYzine (ATARAX/VISTARIL) tablet 25 mg, 25 mg, Oral, TID PRN, Val Riles, MD .  insulin aspart (novoLOG) injection 0-20 Units, 0-20 Units, Subcutaneous, TID [facility], Mansy, Arvella Merles, MD, 7 Units at 12/18/20 1207 .  insulin aspart (novoLOG) injection 3 Units, 3 Units, Subcutaneous, TID WC, Val Riles, MD, 3 Units at 12/18/20 1207 .  ipratropium-albuterol (DUONEB) 0.5-2.5 (3) MG/3ML nebulizer solution 3 mL, 3 mL, Nebulization, Q2H PRN, Little Ishikawa, MD, 3 mL at 12/18/20 0741 .  irbesartan (AVAPRO)  tablet 300 mg, 300 mg, Oral, Daily, Little Ishikawa, MD, 300 mg at 12/18/20 0941 .  loratadine (CLARITIN) tablet 10 mg, 10 mg, Oral, Daily, Little Ishikawa, MD, 10 mg at 12/18/20 0940 .  magnesium hydroxide (MILK OF MAGNESIA) suspension 30 mL, 30 mL, Oral, Daily PRN, Mansy, Jan A, MD, 30 mL at 12/08/20 1057 .  mometasone-formoterol (DULERA) 200-5 MCG/ACT inhaler 2 puff, 2 puff, Inhalation, BID, Little Ishikawa, MD, 2 puff at 12/18/20 0945 .  ondansetron (ZOFRAN) tablet 4 mg, 4 mg, Oral, Q6H PRN **OR** ondansetron (ZOFRAN) injection 4 mg, 4 mg, Intravenous, Q6H PRN, Mansy, Jan A, MD .  pantoprazole (PROTONIX) EC tablet 40 mg, 40 mg, Oral, Daily, Mariel Aloe, MD, 40 mg at 12/18/20 0940 .  polyethylene glycol (MIRALAX / GLYCOLAX) packet 17 g, 17 g, Oral, Daily, Val Riles, MD, 17 g at 12/16/20 0840 .  [START ON 12/19/2020] predniSONE (DELTASONE) tablet 40 mg, 40 mg, Oral, Q breakfast, Adhikari, Amrit, MD .  rosuvastatin (CRESTOR) tablet 40 mg, 40 mg, Oral, Daily, Little Ishikawa, MD, 40 mg at 12/18/20 0940 .  traZODone (DESYREL) tablet 25 mg, 25 mg, Oral, QHS PRN, Mansy, Jan A, MD .  umeclidinium bromide (INCRUSE ELLIPTA) 62.5 MCG/INH 1 puff, 1 puff, Inhalation, Daily, Little Ishikawa, MD, 1 puff at 12/18/20 [phone] .  Vitamin D (Ergocalciferol) (DRISDOL) capsule 50,000 Units, 50,000 Units, Oral, Q7 days,  Val Riles, MD, 50,000 Units at 12/15/20 2049    ALLERGIES   Morphine and related     REVIEW OF SYSTEMS    Review of Systems:  Gen:  Denies  fever, sweats, chills weigh loss  HEENT: Denies blurred vision, double vision, ear pain, eye pain, hearing loss, nose bleeds, sore throat Cardiac:  No dizziness, chest pain or heaviness, chest tightness,edema Resp:   Denies cough or sputum porduction, shortness of breath,wheezing, hemoptysis,  Gi: Denies swallowing difficulty, stomach pain, nausea or vomiting, diarrhea, constipation, bowel incontinence Gu:  Denies bladder incontinence, burning urine Ext:   Denies Joint pain, stiffness or swelling Skin: Denies  skin rash, easy bruising or bleeding or hives Endoc:  Denies polyuria, polydipsia , polyphagia or weight change Psych:   Denies depression, insomnia or hallucinations   Other:  All other systems negative   VS: BP (!) 105/55 (BP Location: Right Arm)   Pulse 76   Temp 98.2 F (36.8 C) (Oral)   Resp (!) 24   Ht '5\' 5"'  (1.651 m)   Wt 70 kg   SpO2 95%   BMI 25.68 kg/m      PHYSICAL EXAM    GENERAL:NAD, no fevers, chills, no weakness no fatigue HEAD: Normocephalic, atraumatic.  EYES: Pupils equal, round, reactive to light. Extraocular muscles intact. No scleral icterus.  MOUTH: Moist mucosal membrane. Dentition intact. No abscess noted.  EAR, NOSE, THROAT: Clear without exudates. No external lesions.  NECK: Supple. No thyromegaly. No nodules. No JVD.  PULMONARY: mild velcro crepitations bilaterally CARDIOVASCULAR: S1 and S2. Regular rate and rhythm. No murmurs, rubs, or gallops. No edema. Pedal pulses 2+ bilaterally.  GASTROINTESTINAL: Soft, nontender, nondistended. No masses. Positive bowel sounds. No hepatosplenomegaly.  MUSCULOSKELETAL: No swelling, clubbing, or edema. Range of motion full in all extremities.  NEUROLOGIC: Cranial nerves II through XII are intact. No gross focal neurological deficits. Sensation intact.  Reflexes intact.  SKIN: No ulceration, lesions, rashes, or cyanosis. Skin warm and dry. Turgor intact.  PSYCHIATRIC: Mood, affect within normal limits. The patient is awake, alert and oriented  x 3. Insight, judgment intact.       IMAGING    DG Chest 2 View  Result Date: 12/13/2020 CLINICAL DATA:  Dyspnea EXAM: CHEST - 2 VIEW COMPARISON:  12/04/2020 FINDINGS: Slight worsening extensive diffuse and bilateral reticulonodular interstitial opacities. This may represent some component of superimposed edema or infection on chronic fibrotic lung disease. Heart is enlarged. No large effusion or pneumothorax. Low lung volumes. Aorta atherosclerotic and degenerative changes of the spine. IMPRESSION: Slight worsening diffuse interstitial opacities concerning for superimposed edema, less likely infection on background chronic fibrotic lung disease. No large effusion or pneumothorax Aortic Atherosclerosis (ICD10-I70.0). Electronically Signed   By: Jerilynn Mages.  Shick M.D.   On: 12/13/2020 10:54   CT Angio Chest PE W and/or Wo Contrast  Result Date: 12/05/2020 CLINICAL DATA:  Weakness, confusion, shortness of breath EXAM: CT ANGIOGRAPHY CHEST WITH CONTRAST TECHNIQUE: Multidetector CT imaging of the chest was performed using the standard protocol during bolus administration of intravenous contrast. Multiplanar CT image reconstructions and MIPs were obtained to evaluate the vascular anatomy. CONTRAST:  66m OMNIPAQUE IOHEXOL 350 MG/ML SOLN COMPARISON:  CT chest dated 11/03/2020 FINDINGS: Cardiovascular: Satisfactory opacification of the bilateral pulmonary arteries to the segmental level. No evidence of pulmonary embolism. Although not tailored for evaluation of the thoracic aorta, there is no evidence thoracic aortic aneurysm or dissection. The heart is top-normal in size.  No pericardial effusion. Coronary atherosclerosis of the LAD and left circumflex. Mediastinum/Nodes: Mild mediastinal lymphadenopathy, including a 15 mm  short axis low right paratracheal node (series 5/image 113), likely reactive. Visualized thyroid is unremarkable. Lungs/Pleura: Subpleural reticulation/fibrosis in the lungs bilaterally, lower lobe predominant with associated bronchiectasis. This appearance is similar to the recent prior, reflecting sequela of chronic interstitial lung disease. Progressive ground-glass in the bilateral lower lobes favor atelectasis due to poor inspiration. No suspicious pulmonary nodules. No focal consolidation. No pleural effusion or pneumothorax. Upper Abdomen: Visualized upper abdomen is notable for a 3.2 cm left upper pole renal cyst. Musculoskeletal: Mild degenerative changes of the visualized thoracolumbar spine. Review of the MIP images confirms the above findings. IMPRESSION: No evidence of pulmonary embolism. Chronic interstitial lung disease. Mild mediastinal lymphadenopathy, likely reactive. Aortic Atherosclerosis (ICD10-I70.0). Electronically Signed   By: SJulian HyM.D.   On: 12/05/2020 01:25   DG Chest Port 1 View  Result Date: 12/17/2020 CLINICAL DATA:  Interstitial edema EXAM: PORTABLE CHEST 1 VIEW COMPARISON:  Chest radiograph 12/13/2020 FINDINGS: The cardiomediastinal silhouette is stable. There are patchy and interstitial opacities throughout both lungs. Aeration of the left mid lung has slightly improved, while aeration of the remainder of the lungs is not significantly changed. There is no significant pleural effusion. There is no pneumothorax. There is no acute osseous abnormality. IMPRESSION: Patchy and interstitial opacities throughout both lungs with slight interval improvement in aeration of the left mid lung. Electronically Signed   By: PValetta MoleM.D.   On: 12/17/2020 16:52   DG Chest Port 1 View  Result Date: 12/04/2020 CLINICAL DATA:  Weakness, confusion, shortness of breath EXAM: PORTABLE CHEST 1 VIEW COMPARISON:  CT chest dated 11/03/2020 FINDINGS: Subpleural reticulation/fibrosis in  the lungs bilaterally. Associated lower lobe bronchiectasis. This appearance is unchanged from the prior, reflecting chronic interstitial lung disease. No superimposed consolidation.  No pleural effusion or pneumothorax. The heart is normal in size. IMPRESSION: Stable chronic interstitial lung disease. Electronically Signed   By: SJulian HyM.D.   On: 12/04/2020 23:22   ECHOCARDIOGRAM COMPLETE  Result Date: 12/09/2020  ECHOCARDIOGRAM REPORT   Patient Name:   Sierra Guzman Date of Exam: 12/09/2020 Medical Rec #:  [id number]    Height:       65.0 in Accession #:    [id number]   Weight:       154.3 lb Date of Birth:  [birth year]    BSA:          1.772 m Patient Age:    27 years     BP:           120/59 mmHg Patient Gender: F            HR:           70 bpm. Exam Location:  [facility] Procedure: 2D Echo, Cardiac Doppler, Color Doppler and Strain Analysis Indications:     Dyspnea R06.00  History:         Patient has no prior history of Echocardiogram examinations.                  Risk Factors:Hypertension and Diabetes.  Sonographer:     Sherrie Sport Referring Phys:  [id number] Val Riles Diagnosing Phys: Ida Rogue MD  Sonographer Comments: Global longitudinal strain was attempted. IMPRESSIONS  1. Left ventricular ejection fraction, by estimation, is 60 to 65%. The left ventricle has normal function. The left ventricle has no regional wall motion abnormalities. Left ventricular diastolic parameters are consistent with Grade I diastolic dysfunction (impaired relaxation). The average left ventricular global longitudinal strain is -16.6 %. The global longitudinal strain is normal.  2. Right ventricular systolic function is normal. The right ventricular size is normal.  3. The aortic valve is normal in structure. Aortic valve regurgitation is not visualized. Mild to moderate aortic valve sclerosis/calcification is present, without any evidence of aortic stenosis. FINDINGS  Left Ventricle: Left ventricular ejection  fraction, by estimation, is 60 to 65%. The left ventricle has normal function. The left ventricle has no regional wall motion abnormalities. The average left ventricular global longitudinal strain is -16.6 %. The global longitudinal strain is normal. The left ventricular internal cavity size was normal in size. There is no left ventricular hypertrophy. Left ventricular diastolic parameters are consistent with Grade I diastolic dysfunction (impaired relaxation). Right Ventricle: The right ventricular size is normal. No increase in right ventricular wall thickness. Right ventricular systolic function is normal. Left Atrium: Left atrial size was normal in size. Right Atrium: Right atrial size was normal in size. Pericardium: There is no evidence of pericardial effusion. Mitral Valve: The mitral valve is normal in structure. No evidence of mitral valve regurgitation. No evidence of mitral valve stenosis. Tricuspid Valve: The tricuspid valve is normal in structure. Tricuspid valve regurgitation is mild . No evidence of tricuspid stenosis. Aortic Valve: The aortic valve is normal in structure. Aortic valve regurgitation is not visualized. Mild to moderate aortic valve sclerosis/calcification is present, without any evidence of aortic stenosis. Aortic valve mean gradient measures 11.0 mmHg.  Aortic valve peak gradient measures 18.5 mmHg. Aortic valve area, by VTI measures 2.11 cm. Pulmonic Valve: The pulmonic valve was normal in structure. Pulmonic valve regurgitation is not visualized. No evidence of pulmonic stenosis. Aorta: The aortic root is normal in size and structure. Venous: The inferior vena cava is normal in size with greater than 50% respiratory variability, suggesting right atrial pressure of 3 mmHg. IAS/Shunts: No atrial level shunt detected by color flow Doppler.  LEFT VENTRICLE PLAX 2D LVIDd:         3.90 cm  Diastology LVIDs:         2.40 cm  LV e' medial:    5.33 cm/s LV PW:         1.30 cm  LV E/e'  medial:  13.9 LV IVS:        0.80 cm  LV e' lateral:   5.00 cm/s LVOT diam:     2.10 cm  LV E/e' lateral: 14.8 LV SV:         93 LV SV Index:   53       2D Longitudinal Strain LVOT Area:     3.46 cm 2D Strain GLS Avg:     -16.6 %                          3D Volume EF:                         3D EF:        54 %                         LV EDV:       85 ml                         LV ESV:       39 ml                         LV SV:        46 ml RIGHT VENTRICLE RV Basal diam:  3.70 cm RV S prime:     21.60 cm/s TAPSE (M-mode): 3.2 cm LEFT ATRIUM             Index       RIGHT ATRIUM           Index LA diam:        3.50 cm 1.98 cm/m  RA Area:     21.30 cm LA Vol (A2C):   36.1 ml 20.38 ml/m RA Volume:   63.90 ml  36.07 ml/m LA Vol (A4C):   57.7 ml 32.57 ml/m LA Biplane Vol: 48.3 ml 27.26 ml/m  AORTIC VALVE                    PULMONIC VALVE AV Area (Vmax):    2.07 cm     RVOT Peak grad: 2 mmHg AV Area (Vmean):   1.91 cm AV Area (VTI):     2.11 cm AV Vmax:           215.33 cm/s AV Vmean:          156.667 cm/s AV VTI:            0.441 m AV Peak Grad:      18.5 mmHg AV Mean Grad:      11.0 mmHg LVOT Vmax:         129.00 cm/s LVOT Vmean:        86.600 cm/s LVOT VTI:          0.269 m LVOT/AV VTI ratio: 0.61  AORTA Ao Root diam: 3.20 cm MITRAL VALVE                TRICUSPID VALVE MV Area (PHT): 3.02 cm     TR Peak grad:   22.3 mmHg MV Decel Time: 251 msec  TR Vmax:        236.00 cm/s MV E velocity: 74.10 cm/s MV A velocity: 106.00 cm/s  SHUNTS MV E/A ratio:  0.70         Systemic VTI:  0.27 m                             Systemic Diam: 2.10 cm Ida Rogue MD Electronically signed by Ida Rogue MD Signature Date/Time: 12/09/2020/5:40:42 PM    Final        CLINICAL DATA:  Weakness, confusion, shortness of breath   EXAM: CT ANGIOGRAPHY CHEST WITH CONTRAST   TECHNIQUE: Multidetector CT imaging of the chest was performed using the standard protocol during bolus administration of intravenous contrast.  Multiplanar CT image reconstructions and MIPs were obtained to evaluate the vascular anatomy.   CONTRAST:  60m OMNIPAQUE IOHEXOL 350 MG/ML SOLN   COMPARISON:  CT chest dated 11/03/2020   FINDINGS: Cardiovascular: Satisfactory opacification of the bilateral pulmonary arteries to the segmental level. No evidence of pulmonary embolism.   Although not tailored for evaluation of the thoracic aorta, there is no evidence thoracic aortic aneurysm or dissection.   The heart is top-normal in size.  No pericardial effusion.   Coronary atherosclerosis of the LAD and left circumflex.   Mediastinum/Nodes: Mild mediastinal lymphadenopathy, including a 15 mm short axis low right paratracheal node (series 5/image 113), likely reactive.   Visualized thyroid is unremarkable.   Lungs/Pleura: Subpleural reticulation/fibrosis in the lungs bilaterally, lower lobe predominant with associated bronchiectasis. This appearance is similar to the recent prior, reflecting sequela of chronic interstitial lung disease. Progressive ground-glass in the bilateral lower lobes favor atelectasis due to poor inspiration.   No suspicious pulmonary nodules.   No focal consolidation.   No pleural effusion or pneumothorax.   Upper Abdomen: Visualized upper abdomen is notable for a 3.2 cm left upper pole renal cyst.   Musculoskeletal: Mild degenerative changes of the visualized thoracolumbar spine.   Review of the MIP images confirms the above findings.   IMPRESSION: No evidence of pulmonary embolism.   Chronic interstitial lung disease.   Mild mediastinal lymphadenopathy, likely reactive.   Aortic Atherosclerosis (ICD10-I70.0).     Electronically Signed   By: SJulian HyM.D.   On: 12/05/2020 01:25   ASSESSMENT/PLAN   Acute on chronic hypoxemic respiratory failure -Patient with findings on CT chest with diffuse groundglass attenuation UIP pattern apical basal gradient suggestive of  idiopathic pulmonary fibrosis with acute exacerbation. -We will initiate steroids, Solu-Medrol IV 40 daily transition to prednisone with tapering protocol -Require monitoring and increasing insulin coverage due to likely reactive hyperglycemia -There is associated bronchiectasis and traction bronchiolectasis -We will obtain respiratory sputum cultures as well as Fungitell serology -reviewed ANA comprehensive, CPK, ESR, CRP  -CTPE with no venous thromboembolism    COPD with Moderate persistent asthma -Steroids will dually help ILD and asthma at the same time -patient smoked from age [id number]    Thank you for allowing me to participate in the care of this patient.   Patient/Family are satisfied with care plan and all questions have been answered.  This document was prepared using Dragon voice recognition software and may include unintentional dictation errors.     FOttie Glazier M.D.  [facility] [place] [place]

## 2020-12-19 LAB — CBC WITH DIFFERENTIAL/PLATELET
Abs Immature Granulocytes: 0.03 10*3/uL (ref 0.00–0.07)
Basophils Absolute: 0 10*3/uL (ref 0.0–0.1)
Basophils Relative: 0 %
Eosinophils Absolute: 0.2 10*3/uL (ref 0.0–0.5)
Eosinophils Relative: 2 %
HCT: 24.6 % — ABNORMAL LOW (ref 36.0–46.0)
Hemoglobin: 7.5 g/dL — ABNORMAL LOW (ref 12.0–15.0)
Immature Granulocytes: 0 %
Lymphocytes Relative: 35 %
Lymphs Abs: 3.2 10*3/uL (ref 0.7–4.0)
MCH: 28.4 pg (ref 26.0–34.0)
MCHC: 30.5 g/dL (ref 30.0–36.0)
MCV: 93.2 fL (ref 80.0–100.0)
Monocytes Absolute: 0.7 10*3/uL (ref 0.1–1.0)
Monocytes Relative: 8 %
Neutro Abs: 5.1 10*3/uL (ref 1.7–7.7)
Neutrophils Relative %: 55 %
Platelets: 224 10*3/uL (ref 150–400)
RBC: 2.64 MIL/uL — ABNORMAL LOW (ref 3.87–5.11)
RDW: 21.7 % — ABNORMAL HIGH (ref 11.5–15.5)
WBC: 9.2 10*3/uL (ref 4.0–10.5)
nRBC: 0 % (ref 0.0–0.2)

## 2020-12-19 LAB — GLUCOSE, CAPILLARY
Glucose-Capillary: 112 mg/dL — ABNORMAL HIGH (ref 70–99)
Glucose-Capillary: 211 mg/dL — ABNORMAL HIGH (ref 70–99)

## 2020-12-19 LAB — BASIC METABOLIC PANEL
Anion gap: 6 (ref 5–15)
BUN: 17 mg/dL (ref 8–23)
CO2: 27 mmol/L (ref 22–32)
Calcium: 8.8 mg/dL — ABNORMAL LOW (ref 8.9–10.3)
Chloride: 105 mmol/L (ref 98–111)
Creatinine, Ser: 1.02 mg/dL — ABNORMAL HIGH (ref 0.44–1.00)
GFR, Estimated: 55 mL/min — ABNORMAL LOW (ref >60)
Glucose, Bld: 130 mg/dL — ABNORMAL HIGH (ref 70–99)
Potassium: 4.2 mmol/L (ref 3.5–5.1)
Sodium: 138 mmol/L (ref 135–145)

## 2020-12-19 MED ORDER — PREDNISONE 5 MG PO TABS: 5.0000 mg | ORAL_TABLET | Freq: Every day | ORAL | 0 refills | Status: DC

## 2020-12-19 MED ORDER — PREDNISONE 5 MG PO TABS: 5.0000 mg | ORAL_TABLET | Freq: Every day | ORAL | Status: DC

## 2020-12-19 MED ORDER — PREDNISONE 10 MG PO TABS: 10.0000 mg | ORAL_TABLET | Freq: Every day | ORAL | 0 refills | Status: DC

## 2020-12-19 MED ORDER — PREDNISONE 20 MG PO TABS: 20.0000 mg | ORAL_TABLET | Freq: Every day | ORAL | 0 refills | Status: AC

## 2020-12-19 MED ORDER — PREDNISONE 20 MG PO TABS: 20.0000 mg | ORAL_TABLET | Freq: Every day | ORAL | Status: DC

## 2020-12-19 MED ORDER — POLYETHYLENE GLYCOL 3350 17 G PO PACK: 17.0000 g | PACK | Freq: Every day | ORAL | 0 refills | Status: AC

## 2020-12-19 MED ORDER — FUROSEMIDE 20 MG PO TABS: 20.0000 mg | ORAL_TABLET | Freq: Every day | ORAL | Status: DC

## 2020-12-19 MED ORDER — PREDNISONE 20 MG PO TABS: 10.0000 mg | ORAL_TABLET | Freq: Every day | ORAL | Status: DC

## 2020-12-19 MED ORDER — METOPROLOL SUCCINATE ER 25 MG PO TB24
25.0000 mg | ORAL_TABLET | Freq: Every day | ORAL | Status: DC
Start: 2020-12-19 — End: 2021-01-17

## 2020-12-19 MED ORDER — GUAIFENESIN-DM 100-10 MG/5ML PO SYRP: 5.0000 mL | ORAL_SOLUTION | ORAL | 0 refills | Status: DC | PRN

## 2020-12-19 MED ORDER — METFORMIN HCL 1000 MG PO TABS: 1000.0000 mg | ORAL_TABLET | Freq: Two times a day (BID) | ORAL | 11 refills | Status: DC

## 2020-12-19 MED ORDER — VITAMIN D (ERGOCALCIFEROL) 1.25 MG (50000 UNIT) PO CAPS: 50000.0000 [IU] | ORAL_CAPSULE | ORAL | Status: DC

## 2020-12-19 MED ORDER — METFORMIN HCL 850 MG PO TABS: 850.0000 mg | ORAL_TABLET | Freq: Two times a day (BID) | ORAL | 11 refills | Status: DC

## 2020-12-19 MED ORDER — PREDNISONE 20 MG PO TABS: 40.0000 mg | ORAL_TABLET | Freq: Every day | ORAL | Status: AC

## 2020-12-19 NOTE — Progress Notes (Signed)
Stage 2 pressure ulcer found on sacrum. Applied pressure foam dressing.

## 2020-12-19 NOTE — Discharge Summary (Signed)
Physician Discharge Summary  Sierra Guzman GGE:[id number] DOB: Jul 02, 1940 DOA: 12/04/2020  PCP: System, Provider Not In  Admit date: 12/04/2020 Discharge date: 12/19/2020  Admitted From: Home Disposition:  SNF  Discharge Condition:Stable CODE STATUS:FULL Diet recommendation: Heart Healthy    Brief/Interim Summary: Patient is a 80 year old female with history of ILD/pulmonary fibrosis on 2 L of oxygen per minute at rest and 5 L on ambulation, coronary disease, diabetes type 2, gout, hyperlipidemia, GERD, CKD stage III who presented with worsening dyspnea on exertion.  Patient was admitted for the management of acute on chronic respiratory failure with hypoxia from ILD flare.  Pulmonology were following, she was started on IV steroids.  She had prolonged hospital stay.  Her respiratory status has improved and is currently at baseline.  PT/OT recommended skilled nursing facility on discharge.   She is medically stable for discharge to skilled nursing facility today.  Following problems were addressed during her hospitalization:  Acute on chronic hypoxic respiratory failure: Secondary to ILD/pulmonary fibrosis exacerbation.  CT chest showed diffuse groundglass attenuation suggesting  pulmonary fibrosis with acute exacerbation.  There was associated bronchiectasis.  CT angiogram did not show any PE.  Currently maintaining her saturation on 2 L of oxygen at rest which is her baseline. she will follow-up with pulmonology as an outpatient.She is on Esbriet She will continue on Lasix 20 mg daily and will be discharged on tapering dose of prednisone.   History of COPD/moderate persistent asthma:   She is a past smoker.  Continue bronchodilator as needed, inhalers  Diastolic congestive heart failure: Echo done here showed EF of 60 -65%%, grade 1 diastolic dysfunction.  Continue Lasix 20 mg daily on discharge.  Currently she is euvolemic   CKD stage IIIb: Baseline creatinine 1.2.  Currently kidney function  at baseline.   Chronic normocytic anemia: She has required transfusion therapy, IV iron/oral iron supplementation.  Hemoglobin in the range of 7.  Her iron studies showed low iron.  She was given IV iron infusion during this hospitalization.  Continue oral supplementation on discharge   Hypertension: Blood pressures currently well controlled.  On candesartan, metoprolol at home.  Dose of metoprolol reduced to 25 mg daily   Diabetes type 2: Recent hemoglobin A1c of 8.1.  It has worsened from 5.4% as per February 2022.  Most likely secondary to steroids.  Monitor blood sugars.Started on metformin 850 mg bid.We recommend to check hemoglobin A1c in 3 months.  Hyperlipidemia: On Crestor at home   GERD: Continue PPI   Gout: Without evidence of exacerbation.  Takes allopurinol as an outpatient   Debility/deconditioning: PT/OT recommended skilled nursing facility on discharge    Discharge Diagnoses:  Principal Problem:   Acute on chronic respiratory failure with hypoxemia (HCC) Active Problems:   Hyperlipidemia with target LDL less than 100   Hypertension   Stage 3b chronic kidney disease (HCC)   Acute respiratory failure with hypoxia [facility])    Discharge Instructions  Discharge Instructions     Diet - low sodium heart healthy   Complete by: As directed    Discharge instructions   Complete by: As directed    1)Please take prescribed medications as instructed 2)Follow up with pulmonology as an outpatient 3)Do a CBC and Bmp tests in a week   Increase activity slowly   Complete by: As directed       Allergies as of 12/19/2020       Reactions   Morphine And Related Nausea And Vomiting  Medication List     STOP taking these medications    cetirizine 10 MG tablet Commonly known as: ZYRTEC   magnesium oxide 400 (240 Mg) MG tablet Commonly known as: MAG-OX       TAKE these medications    acetaminophen 500 MG tablet Commonly known as: TYLENOL Take 2 tablets  (1,000 mg total) by mouth every 6 (six) hours as needed for mild pain (or Fever >/= 101).   albuterol 108 (90 Base) MCG/ACT inhaler Commonly known as: VENTOLIN HFA Inhale 1-2 puffs into the lungs every 6 (six) hours as needed for wheezing or shortness of breath.   albuterol 0.63 MG/3ML nebulizer solution Commonly known as: ACCUNEB SMARTSIG:3 Milliliter(s) Via Nebulizer Every 6 Hours PRN   allopurinol 300 MG tablet Commonly known as: ZYLOPRIM Take 300 mg by mouth daily. What changed: Another medication with the same name was removed. Continue taking this medication, and follow the directions you see here.   aspirin EC 81 MG tablet Take 81 mg by mouth daily.   candesartan 32 MG tablet Commonly known as: ATACAND Take 32 mg by mouth daily.   cholecalciferol 1000 units tablet Commonly known as: VITAMIN D Take 1,000 Units by mouth daily.   Cinnamon 500 MG capsule Take 500 mg by mouth daily.   diphenhydrAMINE 25 mg capsule Commonly known as: BENADRYL Take 25 mg by mouth as needed for itching.   docusate sodium 100 MG capsule Commonly known as: COLACE Take 1 capsule (100 mg total) by mouth 2 (two) times daily.   Esbriet 267 MG Tabs Generic drug: Pirfenidone Take 2 tablets by mouth 3 (three) times daily.   ferrous gluconate 324 MG tablet Commonly known as: FERGON Take 324 mg by mouth daily with breakfast.   Fish Oil 1000 MG Caps Take 1 capsule by mouth daily.   Fluticasone-Salmeterol 500-50 MCG/DOSE Aepb Commonly known as: ADVAIR Inhale 1 puff into the lungs 2 (two) times daily.   furosemide 20 MG tablet Commonly known as: LASIX Take 1 tablet (20 mg total) by mouth daily.   gabapentin 300 MG capsule Commonly known as: NEURONTIN TAKE 2 CAPSULES BY MOUTH TWICE A DAY AND TAKE 3 CAPS AT BEDTIME   GlucoCom Blood Glucose Monitor Devi See admin instructions.   guaiFENesin-dextromethorphan 100-10 MG/5ML syrup Commonly known as: ROBITUSSIN DM Take 5 mLs by mouth every 4  (four) hours as needed for cough.   Incruse Ellipta 62.5 MCG/INH Aepb Generic drug: umeclidinium bromide Inhale 1 puff into the lungs daily.   ipratropium 0.02 % nebulizer solution Commonly known as: ATROVENT SMARTSIG:2.5 Milliliter(s) Via Nebulizer 4 Times Daily   loratadine 10 MG tablet Commonly known as: CLARITIN Take by mouth.   magnesium oxide 400 MG tablet Commonly known as: MAG-OX Take 1 tablet by mouth daily.   metoprolol succinate 25 MG 24 hr tablet Commonly known as: TOPROL-XL Take 1 tablet (25 mg total) by mouth daily. What changed:  medication strength how much to take   Multi-Vitamin tablet Take 1 tablet by mouth as needed.   NONFORMULARY OR COMPOUNDED ITEM   omeprazole 20 MG capsule Commonly known as: PRILOSEC Take 20 mg by mouth daily.   polyethylene glycol 17 g packet Commonly known as: MIRALAX / GLYCOLAX Take 17 g by mouth daily.   predniSONE 20 MG tablet Commonly known as: DELTASONE Take 2 tablets (40 mg total) by mouth daily with breakfast for 2 days. Start taking on: December 20, 2020   predniSONE 20 MG tablet Commonly known as: DELTASONE  Take 1 tablet (20 mg total) by mouth daily with breakfast for 3 days. Start taking on: December 22, 2020   predniSONE 10 MG tablet Commonly known as: DELTASONE Take 1 tablet (10 mg total) by mouth daily with breakfast for 3 days. Start taking on: December 25, 2020   predniSONE 5 MG tablet Commonly known as: DELTASONE Take 1 tablet (5 mg total) by mouth daily with breakfast for 3 days. Start taking on: December 28, 2020   rosuvastatin 40 MG tablet Commonly known as: CRESTOR Take 40 mg by mouth daily.   vitamin B-12 1000 MCG tablet Commonly known as: CYANOCOBALAMIN Take 1,000 mcg by mouth daily.   Vitamin D (Ergocalciferol) 1.25 MG (50000 UNIT) Caps capsule Commonly known as: DRISDOL Take 1 capsule (50,000 Units total) by mouth every 7 (seven) days. Start taking on: December 22, 2020         Contact information for follow-up providers     Vida Rigger, MD. Schedule an appointment as soon as possible for a visit in 3 week(s).   Specialty: Pulmonary Disease Contact information: [address] [place] [address] [phone]              Contact information for after-discharge care     Destination     [facility] .   Service: Skilled Nursing Contact information: [address] [place] [address] [phone]                    Allergies  Allergen Reactions   Morphine And Related Nausea And Vomiting    Consultations: PCCM   Procedures/Studies: DG Chest 2 View  Result Date: 12/13/2020 CLINICAL DATA:  Dyspnea EXAM: CHEST - 2 VIEW COMPARISON:  12/04/2020 FINDINGS: Slight worsening extensive diffuse and bilateral reticulonodular interstitial opacities. This may represent some component of superimposed edema or infection on chronic fibrotic lung disease. Heart is enlarged. No large effusion or pneumothorax. Low lung volumes. Aorta atherosclerotic and degenerative changes of the spine. IMPRESSION: Slight worsening diffuse interstitial opacities concerning for superimposed edema, less likely infection on background chronic fibrotic lung disease. No large effusion or pneumothorax Aortic Atherosclerosis (ICD10-I70.0). Electronically Signed   By: Judie Petit.  Shick M.D.   On: 12/13/2020 10:54   CT Angio Chest PE W and/or Wo Contrast  Result Date: 12/05/2020 CLINICAL DATA:  Weakness, confusion, shortness of breath EXAM: CT ANGIOGRAPHY CHEST WITH CONTRAST TECHNIQUE: Multidetector CT imaging of the chest was performed using the standard protocol during bolus administration of intravenous contrast. Multiplanar CT image reconstructions and MIPs were obtained to evaluate the vascular anatomy. CONTRAST:  60mL OMNIPAQUE IOHEXOL 350 MG/ML SOLN COMPARISON:  CT chest dated 11/03/2020 FINDINGS: Cardiovascular: Satisfactory  opacification of the bilateral pulmonary arteries to the segmental level. No evidence of pulmonary embolism. Although not tailored for evaluation of the thoracic aorta, there is no evidence thoracic aortic aneurysm or dissection. The heart is top-normal in size.  No pericardial effusion. Coronary atherosclerosis of the LAD and left circumflex. Mediastinum/Nodes: Mild mediastinal lymphadenopathy, including a 15 mm short axis low right paratracheal node (series 5/image 113), likely reactive. Visualized thyroid is unremarkable. Lungs/Pleura: Subpleural reticulation/fibrosis in the lungs bilaterally, lower lobe predominant with associated bronchiectasis. This appearance is similar to the recent prior, reflecting sequela of chronic interstitial lung disease. Progressive ground-glass in the bilateral lower lobes favor atelectasis due to poor inspiration. No suspicious pulmonary nodules. No focal consolidation. No pleural effusion or pneumothorax. Upper Abdomen: Visualized upper abdomen is notable for a  3.2 cm left upper pole renal cyst. Musculoskeletal: Mild degenerative changes of the visualized thoracolumbar spine. Review of the MIP images confirms the above findings. IMPRESSION: No evidence of pulmonary embolism. Chronic interstitial lung disease. Mild mediastinal lymphadenopathy, likely reactive. Aortic Atherosclerosis (ICD10-I70.0). Electronically Signed   By: Charline Bills M.D.   On: 12/05/2020 01:25   DG Chest Port 1 View  Result Date: 12/17/2020 CLINICAL DATA:  Interstitial edema EXAM: PORTABLE CHEST 1 VIEW COMPARISON:  Chest radiograph 12/13/2020 FINDINGS: The cardiomediastinal silhouette is stable. There are patchy and interstitial opacities throughout both lungs. Aeration of the left mid lung has slightly improved, while aeration of the remainder of the lungs is not significantly changed. There is no significant pleural effusion. There is no pneumothorax. There is no acute osseous abnormality.  IMPRESSION: Patchy and interstitial opacities throughout both lungs with slight interval improvement in aeration of the left mid lung. Electronically Signed   By: Lesia Hausen M.D.   On: 12/17/2020 16:52   DG Chest Port 1 View  Result Date: 12/04/2020 CLINICAL DATA:  Weakness, confusion, shortness of breath EXAM: PORTABLE CHEST 1 VIEW COMPARISON:  CT chest dated 11/03/2020 FINDINGS: Subpleural reticulation/fibrosis in the lungs bilaterally. Associated lower lobe bronchiectasis. This appearance is unchanged from the prior, reflecting chronic interstitial lung disease. No superimposed consolidation.  No pleural effusion or pneumothorax. The heart is normal in size. IMPRESSION: Stable chronic interstitial lung disease. Electronically Signed   By: Charline Bills M.D.   On: 12/04/2020 23:22   ECHOCARDIOGRAM COMPLETE  Result Date: 12/09/2020    ECHOCARDIOGRAM REPORT   Patient Name:   Sierra Guzman Date of Exam: 12/09/2020 Medical Rec #:  [id number]    Height:       65.0 in Accession #:    [id number]   Weight:       154.3 lb Date of Birth:  06/19/1940    BSA:          1.772 m Patient Age:    80 years     BP:           120/59 mmHg Patient Gender: F            HR:           70 bpm. Exam Location:  [facility] Procedure: 2D Echo, Cardiac Doppler, Color Doppler and Strain Analysis Indications:     Dyspnea R06.00  History:         Patient has no prior history of Echocardiogram examinations.                  Risk Factors:Hypertension and Diabetes.  Sonographer:     Cristela Blue Referring Phys:  [id number] Gillis Santa Diagnosing Phys: Julien Nordmann MD  Sonographer Comments: Global longitudinal strain was attempted. IMPRESSIONS  1. Left ventricular ejection fraction, by estimation, is 60 to 65%. The left ventricle has normal function. The left ventricle has no regional wall motion abnormalities. Left ventricular diastolic parameters are consistent with Grade I diastolic dysfunction (impaired relaxation). The average left ventricular  global longitudinal strain is -16.6 %. The global longitudinal strain is normal.  2. Right ventricular systolic function is normal. The right ventricular size is normal.  3. The aortic valve is normal in structure. Aortic valve regurgitation is not visualized. Mild to moderate aortic valve sclerosis/calcification is present, without any evidence of aortic stenosis. FINDINGS  Left Ventricle: Left ventricular ejection fraction, by estimation, is 60 to 65%. The left ventricle has normal function. The left ventricle has  no regional wall motion abnormalities. The average left ventricular global longitudinal strain is -16.6 %. The global longitudinal strain is normal. The left ventricular internal cavity size was normal in size. There is no left ventricular hypertrophy. Left ventricular diastolic parameters are consistent with Grade I diastolic dysfunction (impaired relaxation). Right Ventricle: The right ventricular size is normal. No increase in right ventricular wall thickness. Right ventricular systolic function is normal. Left Atrium: Left atrial size was normal in size. Right Atrium: Right atrial size was normal in size. Pericardium: There is no evidence of pericardial effusion. Mitral Valve: The mitral valve is normal in structure. No evidence of mitral valve regurgitation. No evidence of mitral valve stenosis. Tricuspid Valve: The tricuspid valve is normal in structure. Tricuspid valve regurgitation is mild . No evidence of tricuspid stenosis. Aortic Valve: The aortic valve is normal in structure. Aortic valve regurgitation is not visualized. Mild to moderate aortic valve sclerosis/calcification is present, without any evidence of aortic stenosis. Aortic valve mean gradient measures 11.0 mmHg.  Aortic valve peak gradient measures 18.5 mmHg. Aortic valve area, by VTI measures 2.11 cm. Pulmonic Valve: The pulmonic valve was normal in structure. Pulmonic valve regurgitation is not visualized. No evidence of pulmonic  stenosis. Aorta: The aortic root is normal in size and structure. Venous: The inferior vena cava is normal in size with greater than 50% respiratory variability, suggesting right atrial pressure of 3 mmHg. IAS/Shunts: No atrial level shunt detected by color flow Doppler.  LEFT VENTRICLE PLAX 2D LVIDd:         3.90 cm  Diastology LVIDs:         2.40 cm  LV e' medial:    5.33 cm/s LV PW:         1.30 cm  LV E/e' medial:  13.9 LV IVS:        0.80 cm  LV e' lateral:   5.00 cm/s LVOT diam:     2.10 cm  LV E/e' lateral: 14.8 LV SV:         93 LV SV Index:   53       2D Longitudinal Strain LVOT Area:     3.46 cm 2D Strain GLS Avg:     -16.6 %                          3D Volume EF:                         3D EF:        54 %                         LV EDV:       85 ml                         LV ESV:       39 ml                         LV SV:        46 ml RIGHT VENTRICLE RV Basal diam:  3.70 cm RV S prime:     21.60 cm/s TAPSE (M-mode): 3.2 cm LEFT ATRIUM             Index       RIGHT ATRIUM  Index LA diam:        3.50 cm 1.98 cm/m  RA Area:     21.30 cm LA Vol (A2C):   36.1 ml 20.38 ml/m RA Volume:   63.90 ml  36.07 ml/m LA Vol (A4C):   57.7 ml 32.57 ml/m LA Biplane Vol: 48.3 ml 27.26 ml/m  AORTIC VALVE                    PULMONIC VALVE AV Area (Vmax):    2.07 cm     RVOT Peak grad: 2 mmHg AV Area (Vmean):   1.91 cm AV Area (VTI):     2.11 cm AV Vmax:           215.33 cm/s AV Vmean:          156.667 cm/s AV VTI:            0.441 m AV Peak Grad:      18.5 mmHg AV Mean Grad:      11.0 mmHg LVOT Vmax:         129.00 cm/s LVOT Vmean:        86.600 cm/s LVOT VTI:          0.269 m LVOT/AV VTI ratio: 0.61  AORTA Ao Root diam: 3.20 cm MITRAL VALVE                TRICUSPID VALVE MV Area (PHT): 3.02 cm     TR Peak grad:   22.3 mmHg MV Decel Time: 251 msec     TR Vmax:        236.00 cm/s MV E velocity: 74.10 cm/s MV A velocity: 106.00 cm/s  SHUNTS MV E/A ratio:  0.70         Systemic VTI:  0.27 m                              Systemic Diam: 2.10 cm Julien Nordmannimothy Gollan MD Electronically signed by Julien Nordmannimothy Gollan MD Signature Date/Time: 12/09/2020/5:40:42 PM    Final       Subjective: Patient seen and examined the bedside this morning.  Medically stable for discharge today  Discharge Exam: Vitals:   12/19/20 0751 12/19/20 0802  BP:  110/61  Pulse:  64  Resp:  18  Temp:  97.9 F (36.6 C)  SpO2: 96% 98%   Vitals:   12/18/20 1522 12/19/20 0432 12/19/20 0751 12/19/20 0802  BP: 101/60 119/61  110/61  Pulse: 93 76  64  Resp: (!) 24 15  18   Temp: 98.4 F (36.9 C) 98 F (36.7 C)  97.9 F (36.6 C)  TempSrc: Oral Oral    SpO2: 98% 95% 96% 98%  Weight:      Height:        General: Pt is alert, awake, not in acute distress Cardiovascular: RRR, S1/S2 +, no rubs, no gallops Respiratory: CTA bilaterally, no wheezing, no rhonchi Abdominal: Soft, NT, ND, bowel sounds + Extremities: no edema, no cyanosis    The results of significant diagnostics from this hospitalization (including imaging, microbiology, ancillary and laboratory) are listed below for reference.     Microbiology: Recent Results (from the past 240 hour(s))  MRSA Next Gen by PCR, Nasal     Status: None   Collection Time: 12/15/20  6:28 PM   Specimen: Nasal Mucosa; Nasal Swab  Result Value Ref Range Status   MRSA by PCR Next Gen NOT DETECTED NOT  DETECTED Final    Comment: (NOTE) The GeneXpert MRSA Assay (FDA approved for NASAL specimens only), is one component of a comprehensive MRSA colonization surveillance program. It is not intended to diagnose MRSA infection nor to guide or monitor treatment for MRSA infections. Test performance is not FDA approved in patients less than 53 years old. Performed at [facility], [address] [place], [place] [address]   Resp Panel by RT-PCR (Flu A&B, Covid) Nasopharyngeal Swab     Status: None   Collection Time: 12/18/20 11:37 AM   Specimen: Nasopharyngeal Swab; Nasopharyngeal(NP)  swabs in vial transport medium  Result Value Ref Range Status   SARS Coronavirus 2 by RT PCR NEGATIVE NEGATIVE Final    Comment: (NOTE) SARS-CoV-2 target nucleic acids are NOT DETECTED.  The SARS-CoV-2 RNA is generally detectable in upper respiratory specimens during the acute phase of infection. The lowest concentration of SARS-CoV-2 viral copies this assay can detect is 138 copies/mL. A negative result does not preclude SARS-Cov-2 infection and should not be used as the sole basis for treatment or other patient management decisions. A negative result may occur with  improper specimen collection/handling, submission of specimen other than nasopharyngeal swab, presence of viral mutation(s) within the areas targeted by this assay, and inadequate number of viral copies(<138 copies/mL). A negative result must be combined with clinical observations, patient history, and epidemiological information. The expected result is Negative.  Fact Sheet for Patients:  BloggerCourse.com  Fact Sheet for Healthcare Providers:  SeriousBroker.it  This test is no t yet approved or cleared by the [place] FDA and  has been authorized for detection and/or diagnosis of SARS-CoV-2 by FDA under an Emergency Use Authorization (EUA). This EUA will remain  in effect (meaning this test can be used) for the duration of the COVID-19 declaration under Section 564(b)(1) of the Act, 21 U.S.C.section 360bbb-3(b)(1), unless the authorization is terminated  or revoked sooner.       Influenza A by PCR NEGATIVE NEGATIVE Final   Influenza B by PCR NEGATIVE NEGATIVE Final    Comment: (NOTE) The Xpert Xpress SARS-CoV-2/FLU/RSV plus assay is intended as an aid in the diagnosis of influenza from Nasopharyngeal swab specimens and should not be used as a sole basis for treatment. Nasal washings and aspirates are unacceptable for Xpert Xpress  SARS-CoV-2/FLU/RSV testing.  Fact Sheet for Patients: BloggerCourse.com  Fact Sheet for Healthcare Providers: SeriousBroker.it  This test is not yet approved or cleared by the [place] FDA and has been authorized for detection and/or diagnosis of SARS-CoV-2 by FDA under an Emergency Use Authorization (EUA). This EUA will remain in effect (meaning this test can be used) for the duration of the COVID-19 declaration under Section 564(b)(1) of the Act, 21 U.S.C. section 360bbb-3(b)(1), unless the authorization is terminated or revoked.  Performed at [facility], [address] [place], [place] [address]      Labs: BNP (last 3 results) Recent Labs    11/03/20 1952 12/04/20 2330  BNP 93.5 87.1   Basic Metabolic Panel: Recent Labs  Lab 12/13/20 1340 12/14/20 0513 12/17/20 0824 12/18/20 0446 12/19/20 0456  NA 137 139 136 138 138  K 3.6 4.0 4.0 3.6 4.2  CL 109 108 104 106 105  CO2 24 27 26 29 27   GLUCOSE 164* 109* 139* 117* 130*  BUN 11 9 11 12 17   CREATININE 1.09* 1.20* 1.13* 1.06* 1.02*  CALCIUM 8.8* 8.6* 9.3 8.9 8.8*  MG 1.9  --   --   --   --  PHOS 2.0* 4.3  --  3.4  --    Liver Function Tests: Recent Labs  Lab 12/14/20 0513 12/17/20 0824  AST  --  22  ALT  --  9  ALKPHOS  --  49  BILITOT  --  0.5  PROT  --  6.1*  ALBUMIN 2.4* 2.8*   No results for input(s): LIPASE, AMYLASE in the last 168 hours. No results for input(s): AMMONIA in the last 168 hours. CBC: Recent Labs  Lab 12/13/20 1340 12/18/20 0446 12/19/20 0456  WBC 11.3* 11.9* 9.2  NEUTROABS  --  7.9* 5.1  HGB 8.2* 7.6* 7.5*  HCT 26.9* 24.3* 24.6*  MCV 91.2 90.3 93.2  PLT 224 221 224   Cardiac Enzymes: Recent Labs  Lab 12/15/20 1801  CKTOTAL 36*   BNP: Invalid input(s): POCBNP CBG: Recent Labs  Lab 12/18/20 0748 12/18/20 1144 12/18/20 1628 12/18/20 2141 12/19/20 0755  GLUCAP 118* 233* 147* 134* 112*    D-Dimer No results for input(s): DDIMER in the last 72 hours. Hgb A1c No results for input(s): HGBA1C in the last 72 hours. Lipid Profile No results for input(s): CHOL, HDL, LDLCALC, TRIG, CHOLHDL, LDLDIRECT in the last 72 hours. Thyroid function studies No results for input(s): TSH, T4TOTAL, T3FREE, THYROIDAB in the last 72 hours.  Invalid input(s): FREET3 Anemia work up No results for input(s): VITAMINB12, FOLATE, FERRITIN, TIBC, IRON, RETICCTPCT in the last 72 hours. Urinalysis    Component Value Date/Time   COLORURINE YELLOW (A) 12/04/2020 2330   APPEARANCEUR CLEAR (A) 12/04/2020 2330   LABSPEC 1.021 12/04/2020 2330   PHURINE 5.0 12/04/2020 2330   GLUCOSEU >=500 (A) 12/04/2020 2330   HGBUR NEGATIVE 12/04/2020 2330   BILIRUBINUR NEGATIVE 12/04/2020 2330   KETONESUR NEGATIVE 12/04/2020 2330   PROTEINUR NEGATIVE 12/04/2020 2330   NITRITE NEGATIVE 12/04/2020 2330   LEUKOCYTESUR NEGATIVE 12/04/2020 2330   Sepsis Labs Invalid input(s): PROCALCITONIN,  WBC,  LACTICIDVEN Microbiology Recent Results (from the past 240 hour(s))  MRSA Next Gen by PCR, Nasal     Status: None   Collection Time: 12/15/20  6:28 PM   Specimen: Nasal Mucosa; Nasal Swab  Result Value Ref Range Status   MRSA by PCR Next Gen NOT DETECTED NOT DETECTED Final    Comment: (NOTE) The GeneXpert MRSA Assay (FDA approved for NASAL specimens only), is one component of a comprehensive MRSA colonization surveillance program. It is not intended to diagnose MRSA infection nor to guide or monitor treatment for MRSA infections. Test performance is not FDA approved in patients less than 59 years old. Performed at [facility], [address] [place], [place] [address]   Resp Panel by RT-PCR (Flu A&B, Covid) Nasopharyngeal Swab     Status: None   Collection Time: 12/18/20 11:37 AM   Specimen: Nasopharyngeal Swab; Nasopharyngeal(NP) swabs in vial transport medium  Result Value Ref Range Status   SARS  Coronavirus 2 by RT PCR NEGATIVE NEGATIVE Final    Comment: (NOTE) SARS-CoV-2 target nucleic acids are NOT DETECTED.  The SARS-CoV-2 RNA is generally detectable in upper respiratory specimens during the acute phase of infection. The lowest concentration of SARS-CoV-2 viral copies this assay can detect is 138 copies/mL. A negative result does not preclude SARS-Cov-2 infection and should not be used as the sole basis for treatment or other patient management decisions. A negative result may occur with  improper specimen collection/handling, submission of specimen other than nasopharyngeal swab, presence of viral mutation(s) within the areas targeted by this  assay, and inadequate number of viral copies(<138 copies/mL). A negative result must be combined with clinical observations, patient history, and epidemiological information. The expected result is Negative.  Fact Sheet for Patients:  BloggerCourse.com  Fact Sheet for Healthcare Providers:  SeriousBroker.it  This test is no t yet approved or cleared by the [place] FDA and  has been authorized for detection and/or diagnosis of SARS-CoV-2 by FDA under an Emergency Use Authorization (EUA). This EUA will remain  in effect (meaning this test can be used) for the duration of the COVID-19 declaration under Section 564(b)(1) of the Act, 21 U.S.C.section 360bbb-3(b)(1), unless the authorization is terminated  or revoked sooner.       Influenza A by PCR NEGATIVE NEGATIVE Final   Influenza B by PCR NEGATIVE NEGATIVE Final    Comment: (NOTE) The Xpert Xpress SARS-CoV-2/FLU/RSV plus assay is intended as an aid in the diagnosis of influenza from Nasopharyngeal swab specimens and should not be used as a sole basis for treatment. Nasal washings and aspirates are unacceptable for Xpert Xpress SARS-CoV-2/FLU/RSV testing.  Fact Sheet for  Patients: BloggerCourse.com  Fact Sheet for Healthcare Providers: SeriousBroker.it  This test is not yet approved or cleared by the [place] FDA and has been authorized for detection and/or diagnosis of SARS-CoV-2 by FDA under an Emergency Use Authorization (EUA). This EUA will remain in effect (meaning this test can be used) for the duration of the COVID-19 declaration under Section 564(b)(1) of the Act, 21 U.S.C. section 360bbb-3(b)(1), unless the authorization is terminated or revoked.  Performed at [facility], [address], [place], [place] [address]     Please note: You were cared for by a hospitalist during your hospital stay. Once you are discharged, your primary care physician will handle any further medical issues. Please note that NO REFILLS for any discharge medications will be authorized once you are discharged, as it is imperative that you return to your primary care physician (or establish a relationship with a primary care physician if you do not have one) for your post hospital discharge needs so that they can reassess your need for medications and monitor your lab values.    Time coordinating discharge: 40 minutes  SIGNED:   Burnadette Pop, MD  [facility] 12/19/2020, 9:57 AM Pager [phone]  If 7PM-7AM, please contact night-coverage [email or web address] Password [email or web address]

## 2020-12-19 NOTE — Progress Notes (Signed)
Called report to [place] at [facility]. Discharged via Doctor, general practice by [employer].

## 2020-12-19 NOTE — TOC Transition Note (Signed)
Transition of Care [facility]) - CM/SW Discharge Note   Patient Details  Name: Sierra Guzman MRN: [id number] Date of Birth: 1940/12/29  Transition of Care [facility]) CM/SW Contact:  Margarito Liner, LCSW Phone Number: 12/19/2020, 11:31 AM   Clinical Narrative:  Patient has orders to discharge to [facility] today. RN will call report to [phone] (Room B3, Bed 2). [facility] scheduled for 2:30. No further concerns. CSW signing off.   Final next level of care: Skilled Nursing Facility Barriers to Discharge: Barriers Resolved   Patient Goals and CMS Choice Patient states their goals for this hospitalization and ongoing recovery are:: To figure out what is going on with me before I got home   Choice offered to / list presented to : Patient, Adult Children  Discharge Placement   Existing PASRR number confirmed : 12/08/20          Patient chooses bed at: Other - please specify in the comment section below: [facility] in [place]) Patient to be transferred to facility by: [facility] Name of family member notified: [name] Patient and family notified of of transfer: 12/19/20  Discharge Plan and Services   Discharge Planning Services: CM Consult                      HH Arranged: RN, PT, OT [facility] Agency: [facility] Date [facility] Agency Contacted: 12/07/20   Representative spoke with at [facility] Agency: [place]  Social Determinants of Health (SDOH) Interventions     Readmission Risk Interventions No flowsheet data found.

## 2020-12-19 NOTE — TOC Progression Note (Signed)
Transition of Care [facility]) - Progression Note    Patient Details  Name: Sierra Guzman MRN: [id number] Date of Birth: 1940/09/10  Transition of Care [facility]) CM/SW Contact  Margarito Liner, LCSW Phone Number: 12/19/2020, 10:29 AM  Clinical Narrative:  Left voicemail for [facility] admissions coordinator to let her know discharge summary is in. Asked her to call back and let me know when transport can be arranged.   Expected Discharge Plan: Home w Home Health Services Barriers to Discharge: Continued Medical Work up  Expected Discharge Plan and Services Expected Discharge Plan: Home w Home Health Services   Discharge Planning Services: CM Consult   Living arrangements for the past 2 months: Single Family Home Expected Discharge Date: 12/19/20                         HH Arranged: RN, PT, OT HH Agency: [facility] Date [facility] Agency Contacted: 12/07/20   Representative spoke with at [facility] Agency: [place]   Social Determinants of Health (SDOH) Interventions    Readmission Risk Interventions No flowsheet data found.

## 2020-12-21 ENCOUNTER — Emergency Department (HOSPITAL_COMMUNITY): Payer: Medicare HMO

## 2020-12-21 ENCOUNTER — Encounter (HOSPITAL_COMMUNITY): Payer: Self-pay | Admitting: *Deleted

## 2020-12-21 ENCOUNTER — Other Ambulatory Visit: Payer: Self-pay

## 2020-12-21 ENCOUNTER — Emergency Department (HOSPITAL_COMMUNITY)
Admission: EM | Admit: 2020-12-21 | Discharge: 2020-12-22 | Disposition: A | Discharge status: Home / Self Care | Payer: Medicare HMO | Attending: Emergency Medicine | Admitting: Emergency Medicine

## 2020-12-21 DIAGNOSIS — Z7982 Long term (current) use of aspirin: Secondary | ICD-10-CM | POA: Insufficient documentation

## 2020-12-21 DIAGNOSIS — D649 Anemia, unspecified: Secondary | ICD-10-CM | POA: Diagnosis not present

## 2020-12-21 DIAGNOSIS — E1165 Type 2 diabetes mellitus with hyperglycemia: Secondary | ICD-10-CM | POA: Diagnosis not present

## 2020-12-21 DIAGNOSIS — Z96653 Presence of artificial knee joint, bilateral: Secondary | ICD-10-CM | POA: Diagnosis not present

## 2020-12-21 DIAGNOSIS — I129 Hypertensive chronic kidney disease with stage 1 through stage 4 chronic kidney disease, or unspecified chronic kidney disease: Secondary | ICD-10-CM | POA: Insufficient documentation

## 2020-12-21 DIAGNOSIS — Z7901 Long term (current) use of anticoagulants: Secondary | ICD-10-CM | POA: Insufficient documentation

## 2020-12-21 DIAGNOSIS — N1832 Chronic kidney disease, stage 3b: Secondary | ICD-10-CM | POA: Insufficient documentation

## 2020-12-21 DIAGNOSIS — Z96643 Presence of artificial hip joint, bilateral: Secondary | ICD-10-CM | POA: Diagnosis not present

## 2020-12-21 DIAGNOSIS — I2510 Atherosclerotic heart disease of native coronary artery without angina pectoris: Secondary | ICD-10-CM | POA: Insufficient documentation

## 2020-12-21 DIAGNOSIS — Z87891 Personal history of nicotine dependence: Secondary | ICD-10-CM | POA: Insufficient documentation

## 2020-12-21 DIAGNOSIS — Z7984 Long term (current) use of oral hypoglycemic drugs: Secondary | ICD-10-CM | POA: Diagnosis not present

## 2020-12-21 DIAGNOSIS — G609 Hereditary and idiopathic neuropathy, unspecified: Secondary | ICD-10-CM | POA: Insufficient documentation

## 2020-12-21 DIAGNOSIS — M79609 Pain in unspecified limb: Secondary | ICD-10-CM | POA: Insufficient documentation

## 2020-12-21 DIAGNOSIS — E871 Hypo-osmolality and hyponatremia: Secondary | ICD-10-CM | POA: Insufficient documentation

## 2020-12-21 DIAGNOSIS — M1991 Primary osteoarthritis, unspecified site: Secondary | ICD-10-CM | POA: Insufficient documentation

## 2020-12-21 DIAGNOSIS — M545 Low back pain, unspecified: Secondary | ICD-10-CM | POA: Insufficient documentation

## 2020-12-21 DIAGNOSIS — M5030 Other cervical disc degeneration, unspecified cervical region: Secondary | ICD-10-CM | POA: Insufficient documentation

## 2020-12-21 DIAGNOSIS — Z79899 Other long term (current) drug therapy: Secondary | ICD-10-CM | POA: Insufficient documentation

## 2020-12-21 DIAGNOSIS — R079 Chest pain, unspecified: Secondary | ICD-10-CM | POA: Insufficient documentation

## 2020-12-21 HISTORY — DX: Cystic fibrosis with pulmonary manifestations: E84.0

## 2020-12-21 LAB — BASIC METABOLIC PANEL
Anion gap: 7 (ref 5–15)
BUN: 25 mg/dL — ABNORMAL HIGH (ref 8–23)
CO2: 25 mmol/L (ref 22–32)
Calcium: 8.9 mg/dL (ref 8.9–10.3)
Chloride: 100 mmol/L (ref 98–111)
Creatinine, Ser: 1.67 mg/dL — ABNORMAL HIGH (ref 0.44–1.00)
GFR, Estimated: 31 mL/min — ABNORMAL LOW (ref >60)
Glucose, Bld: 217 mg/dL — ABNORMAL HIGH (ref 70–99)
Potassium: 4.5 mmol/L (ref 3.5–5.1)
Sodium: 132 mmol/L — ABNORMAL LOW (ref 135–145)

## 2020-12-21 LAB — CBC
HCT: 29.3 % — ABNORMAL LOW (ref 36.0–46.0)
Hemoglobin: 8.9 g/dL — ABNORMAL LOW (ref 12.0–15.0)
MCH: 28.5 pg (ref 26.0–34.0)
MCHC: 30.4 g/dL (ref 30.0–36.0)
MCV: 93.9 fL (ref 80.0–100.0)
Platelets: 292 10*3/uL (ref 150–400)
RBC: 3.12 MIL/uL — ABNORMAL LOW (ref 3.87–5.11)
RDW: 20.8 % — ABNORMAL HIGH (ref 11.5–15.5)
WBC: 9.4 10*3/uL (ref 4.0–10.5)
nRBC: 0 % (ref 0.0–0.2)

## 2020-12-21 LAB — TROPONIN I (HIGH SENSITIVITY)
Troponin I (High Sensitivity): 9 ng/L (ref <18)
Troponin I (High Sensitivity): 9 ng/L (ref <18)

## 2020-12-21 NOTE — ED Provider Notes (Signed)
[facility] EMERGENCY DEPARTMENT Provider Note   CSN: [id number] Arrival date & time: 12/21/20  1936     History Chief Complaint  Patient presents with  . Chest Pain    Sierra Guzman is a 80 y.o. female.  HPI Patient presents for evaluation of chest pain.  She is a somewhat vague historian.  Apparently she became upset, after she saw a bug, inside her room at her rehab facility.  She had been placed there after hospital discharge, 2 days ago.  Level 5 caveat-poor historian    Past Medical History:  Diagnosis Date  . Anemia 10/28/2010   unspecified  . Anxiety   . Aortic valve sclerosis   . Arthritis   . Asthma   . Chronic kidney disease    Chronic Kidney Disease---Stage III  . Coronary artery disease 10/28/2010  . Cystic fibrosis of the lung (HCC)   . DDD (degenerative disc disease), lumbar 2006  . Diabetes mellitus without complication (HCC)   . Diabetes type 2, controlled (HCC) 10/28/2010  . Dyspnea   . GERD (gastroesophageal reflux disease)   . Gout 10/28/2010  . Heart abnormality   . Heart murmur   . History of cardiac catheterization 2006   LAD 50%, D1 50% rest normal.   . History of cardiovascular stress test 09/23/2009    lexiscan Normal Cardiolite test, EF 61%. LV size and function - normal  . History of echocardiogram 05/27/2008   EF 55%; Mild aortic insufficiency with minimal aortic sclerosis. L vent normal.   . Hyperlipidemia   . Hyperlipidemia LDL goal <100 10/28/2010   managed by Dr. Burr Medico  . Hypertension 10/28/2010  . Interstitial pneumonia (HCC)    unspecified  . Mild vitamin D deficiency 02/22/2011  . Obesity (BMI 30-39.9) 10/28/2010   unspecified  . 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 Personal history of colonic polyps 10/28/2010  . Shortness of breath   . Syncope 2013  . Vitamin B 12 deficiency 10/28/2010    Patient Active Problem List   Diagnosis Date Noted  . DDD (degenerative disc disease), cervical 12/21/2020   . Hereditary and idiopathic peripheral neuropathy 12/21/2020  . Localized, primary osteoarthritis 12/21/2020  . Low back pain 12/21/2020  . Pain in limb 12/21/2020  . Acute on chronic respiratory failure with hypoxemia (HCC) 12/05/2020  . Acute respiratory failure with hypoxia (HCC) 12/05/2020  . Hip pain 08/06/2020  . Spinal stenosis in cervical region 08/06/2020  . Spondylolisthesis, congenital 08/06/2020  . 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 Primary localized osteoarthritis of right hip 12/12/2016  . 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  . B12 deficiency 10/28/2010  . Coronary artery disease 10/28/2010  . Diabetes type 2, controlled (HCC) 10/28/2010  . Gout 10/28/2010  . Hyperlipidemia with target LDL less than 100 10/28/2010  . Hypertension 10/28/2010  . Osteoarthritis of left knee 10/28/2010  . Anemia 10/28/2010  . History of colonic polyps 10/28/2010    Past Surgical History:  Procedure Laterality Date  . BACK SURGERY  2006   DDD  . BREAST BIOPSY Right    neg  . CARDIAC CATHETERIZATION  2006  . EYE SURGERY Bilateral    Cataract Extraction with IOL  . JOINT REPLACEMENT Left    Left Total Hip Replacement  . JOINT REPLACEMENT     RIGHT 2005; LEFT 2 X 2004, 2011  . REPLACEMENT TOTAL KNEE Left   .  REVISION TOTAL KNEE ARTHROPLASTY Left    Dr. Ernest Pine  . TONSILLECTOMY    . TOTAL HIP ARTHROPLASTY Right 12/12/2016   Procedure: TOTAL HIP ARTHROPLASTY ANTERIOR APPROACH;  Surgeon: Kennedy Bucker, MD;  Location: [facility] ORS;  Service: Orthopedics;  Laterality: Right;  . TOTAL HIP ARTHROPLASTY Left 12/26/2013   Procedure: ARTHROPLASTY HIP TOTAL; Surgeon: Christell Faith, MD; Location: [facility] OR; Service: Orthopedics; Laterality: left,   . TOTAL KNEE ARTHROPLASTY     both knees     OB History     Gravida  5   Para  5   Term      Preterm      AB      Living  5      SAB       IAB      Ectopic      Multiple      Live Births              Family History  Problem Relation Age of Onset  . Breast cancer Mother 53  . Diabetes Mother   . Stroke Mother   . Stroke Brother   . Breast cancer Cousin        pat cousin    Social History   Tobacco Use  . Smoking status: Former    Packs/day: 0.25    Years: 10.00    Pack years: 2.50    Types: Cigarettes    Quit date: 08/18/1990    Years since quitting: 30.3  . Smokeless tobacco: Never  Vaping Use  . Vaping Use: Never used  Substance Use Topics  . Alcohol use: No  . Drug use: No    Home Medications Prior to Admission medications   Medication Sig Start Date End Date Taking? Authorizing Provider  acetaminophen (TYLENOL) 500 MG tablet Take 2 tablets (1,000 mg total) by mouth every 6 (six) hours as needed for mild pain (or Fever >/= 101). 12/14/16   Evon Slack, PA-C  albuterol (ACCUNEB) 0.63 MG/3ML nebulizer solution SMARTSIG:3 Milliliter(s) Via Nebulizer Every 6 Hours PRN 11/26/20   [provider]  albuterol (VENTOLIN HFA) 108 (90 Base) MCG/ACT inhaler Inhale 1-2 puffs into the lungs every 6 (six) hours as needed for wheezing or shortness of breath.    [provider]  allopurinol (ZYLOPRIM) 300 MG tablet Take 300 mg by mouth daily.     [provider]  aspirin EC 81 MG tablet Take 81 mg by mouth daily.    [provider]  Blood Glucose Monitoring Suppl (GLUCOCOM BLOOD GLUCOSE MONITOR) DEVI See admin instructions. 04/23/19   [provider]  candesartan (ATACAND) 32 MG tablet Take 32 mg by mouth daily. 10/01/18   [provider]  cholecalciferol (VITAMIN D) 1000 units tablet Take 1,000 Units by mouth daily.    [provider]  Cinnamon 500 MG capsule Take 500 mg by mouth daily.    [provider]  diphenhydrAMINE (BENADRYL) 25 mg capsule Take 25 mg by mouth as needed for itching.    [provider]  docusate sodium  (COLACE) 100 MG capsule Take 1 capsule (100 mg total) by mouth 2 (two) times daily. 12/14/16   Evon Slack, PA-C  ESBRIET 267 MG TABS Take 2 tablets by mouth 3 (three) times daily. 03/19/19   [provider]  ferrous gluconate (FERGON) 324 MG tablet Take 324 mg by mouth daily with breakfast. 12/28/13   [provider]  Fluticasone-Salmeterol (ADVAIR) 500-50 MCG/DOSE AEPB  Inhale 1 puff into the lungs 2 (two) times daily.     [provider]  furosemide (LASIX) 20 MG tablet Take 1 tablet (20 mg total) by mouth daily. 12/19/20   Burnadette Pop, MD  gabapentin (NEURONTIN) 300 MG capsule TAKE 2 CAPSULES BY MOUTH TWICE A DAY AND TAKE 3 CAPS AT BEDTIME 03/02/18   [provider]  guaiFENesin-dextromethorphan (ROBITUSSIN DM) 100-10 MG/5ML syrup Take 5 mLs by mouth every 4 (four) hours as needed for cough. 12/19/20   Burnadette Pop, MD  INCRUSE ELLIPTA 62.5 MCG/INH AEPB Inhale 1 puff into the lungs daily. 12/01/20   [provider]  ipratropium (ATROVENT) 0.02 % nebulizer solution SMARTSIG:2.5 Milliliter(s) Via Nebulizer 4 Times Daily 11/05/20   [provider]  loratadine (CLARITIN) 10 MG tablet Take by mouth.    [provider]  magnesium oxide (MAG-OX) 400 MG tablet Take 1 tablet by mouth daily. 07/21/20   [provider]  metFORMIN (GLUCOPHAGE) 850 MG tablet Take 1 tablet (850 mg total) by mouth 2 (two) times daily with a meal. 12/19/20 12/19/21  Burnadette Pop, MD  metoprolol succinate (TOPROL-XL) 25 MG 24 hr tablet Take 1 tablet (25 mg total) by mouth daily. 12/19/20   Burnadette Pop, MD  Multiple Vitamin (MULTI-VITAMIN) tablet Take 1 tablet by mouth as needed.    [provider]  NONFORMULARY OR COMPOUNDED ITEM     [provider]  Omega-3 Fatty Acids (FISH OIL) 1000 MG CAPS Take 1 capsule by mouth daily.     [provider]  omeprazole (PRILOSEC) 20 MG capsule Take 20 mg by mouth daily. 11/05/20   [provider]  polyethylene glycol (MIRALAX / GLYCOLAX) 17 g packet Take 17 g by mouth daily. 12/19/20   Burnadette Pop, MD  predniSONE (DELTASONE) 10 MG tablet Take 1 tablet (10 mg total) by mouth daily with breakfast for 3 days. 12/25/20 12/28/20  Burnadette Pop, MD  predniSONE (DELTASONE) 20 MG tablet Take 2 tablets (40 mg total) by mouth daily with breakfast for 2 days. 12/20/20 12/22/20  Burnadette Pop, MD  predniSONE (DELTASONE) 20 MG tablet Take 1 tablet (20 mg total) by mouth daily with breakfast for 3 days. 12/22/20 12/25/20  Burnadette Pop, MD  predniSONE (DELTASONE) 5 MG tablet Take 1 tablet (5 mg total) by mouth daily with breakfast for 3 days. 12/28/20 12/31/20  Burnadette Pop, MD  rosuvastatin (CRESTOR) 40 MG tablet Take 40 mg by mouth daily. 09/17/20   [provider]  vitamin B-12 (CYANOCOBALAMIN) 1000 MCG tablet Take 1,000 mcg by mouth daily.    [provider]  Vitamin D, Ergocalciferol, (DRISDOL) 1.25 MG (50000 UNIT) CAPS capsule Take 1 capsule (50,000 Units total) by mouth every 7 (seven) days. 12/22/20   Burnadette Pop, MD  enoxaparin (LOVENOX) 40 MG/0.4ML injection Inject 0.4 mLs (40 mg total) into the skin daily. 12/15/16 03/25/19  Evon Slack, PA-C  pregabalin (LYRICA) 50 MG capsule Take 1 capsule (50 mg total) by mouth 3 (three) times daily. 12/18/16 03/25/19  Lorenso Quarry, NP  ranitidine (ZANTAC) 300 MG capsule Take 300 mg by mouth every evening.  03/25/19  [provider]  simvastatin (ZOCOR) 40 MG tablet Take 40 mg by mouth daily at 6 PM.  03/25/19  [provider]    Allergies    Morphine and related and Morphine  Review of Systems   Review of Systems  Unable to perform ROS: Other   Physical Exam Updated Vital Signs BP (!) 144/64  Pulse 67   Temp 98.5 F (36.9 C) (Oral)   Resp (!) 21   Ht 5\' 5"  (1.651 m)   Wt 69.4 kg   SpO2 100%   BMI 25.46 kg/m   Physical Exam Vitals and nursing note reviewed.  Constitutional:       Appearance: She is well-developed. She is not ill-appearing.  HENT:     Head: Normocephalic and atraumatic.     Right Ear: External ear normal.     Left Ear: External ear normal.     Nose: No congestion.     Mouth/Throat:     Pharynx: No posterior oropharyngeal erythema.  Eyes:     Conjunctiva/sclera: Conjunctivae normal.     Pupils: Pupils are equal, round, and reactive to light.  Neck:     Trachea: Phonation normal.  Cardiovascular:     Rate and Rhythm: Normal rate and regular rhythm.     Heart sounds: Normal heart sounds.  Pulmonary:     Effort: Pulmonary effort is normal. No respiratory distress.     Breath sounds: Normal breath sounds. No stridor.  Abdominal:     General: There is no distension.     Palpations: Abdomen is soft.     Tenderness: There is no abdominal tenderness.  Musculoskeletal:        General: Normal range of motion.     Cervical back: Normal range of motion and neck supple.  Skin:    General: Skin is warm and dry.     Coloration: Skin is not jaundiced or pale.  Neurological:     Mental Status: She is alert.     Cranial Nerves: No cranial nerve deficit.     Sensory: No sensory deficit.     Motor: No abnormal muscle tone.     Coordination: Coordination normal.  Psychiatric:        Mood and Affect: Mood normal.        Behavior: Behavior normal.    ED Results / Procedures / Treatments   Labs (all labs ordered are listed, but only abnormal results are displayed) Labs Reviewed  BASIC METABOLIC PANEL - Abnormal; Notable for the following components:      Result Value   Sodium 132 (*)    Glucose, Bld 217 (*)    BUN 25 (*)    Creatinine, Ser 1.67 (*)    GFR, Estimated 31 (*)    All other components within normal limits  CBC - Abnormal; Notable for the following components:   RBC 3.12 (*)    Hemoglobin 8.9 (*)    HCT 29.3 (*)    RDW 20.8 (*)    All other components within normal limits  TROPONIN I (HIGH SENSITIVITY)  TROPONIN I (HIGH  SENSITIVITY)    EKG None  Radiology DG Chest Portable 1 View  Result Date: 12/21/2020 CLINICAL DATA:  Chest pain EXAM: PORTABLE CHEST 1 VIEW COMPARISON:  12/17/2020 FINDINGS: Chronic interstitial markings with subpleural reticulation/fibrosis in the lungs bilaterally. No pleural effusion or pneumothorax. The heart is normal in size. IMPRESSION: Stable chronic interstitial lung disease. Electronically Signed   By: 02/16/2021 M.D.   On: 12/21/2020 20:08    Procedures Procedures   Medications Ordered in ED Medications - No data to display  ED Course  I have reviewed the triage vital signs and the nursing notes.  Pertinent labs & imaging results that were available during my care of the patient were reviewed by me and considered in my medical decision  making (see chart for details).  Clinical Course as of 12/21/20 2322  Tue Dec 21, 2020  2318 I have completed 2 separate telephone calls with the patient's daughter, Dario Guardian.  She states that she was with her mother in the nursing home tonight, where "I saw flying roaches, the room needs a deep cleaning, the staff there gave clients flies waters to kill the bugs, we are trying to move her mother to [facility], and have talked to social workers at both facilities about the need for this transfer."  I communicated to Gena, that there was no indication to keep her in the ED for placement, and/or admit her to the hospital for medical management. [EW]    Clinical Course User Index [EW] Mancel Bale, MD   MDM Rules/Calculators/A&P                            Patient Vitals for the past 24 hrs:  BP Temp Temp src Pulse Resp SpO2 Height Weight  12/21/20 2300 (!) 130/57 -- -- 73 (!) 21 97 % -- --  12/21/20 2230 (!) 106/57 -- -- (!) 56 15 99 % -- --  12/21/20 2200 105/61 -- -- (!) 57 14 100 % -- --  12/21/20 2130 (!) 144/64 -- -- 67 (!) 21 100 % -- --  12/21/20 2100 126/63 -- -- (!) 52 20 100 % -- --  12/21/20 2030 113/64 -- -- 69  (!) 22 98 % -- --  12/21/20 2000 (!) 143/56 -- -- 90 (!) 24 92 % -- --  12/21/20 1959 -- -- -- 88 (!) 22 100 % -- --  12/21/20 1951 (!) 150/78 98.5 F (36.9 C) Oral 81 20 92 % -- --  12/21/20 1945 (!) 150/78 -- -- 84 (!) 22 95 % -- --  12/21/20 1940 -- -- -- -- -- --  (1.651 m) 69.4 kg    11:15 PM Reevaluation with update and discussion. After initial assessment and treatment, an updated evaluation reveals no change in clinical status. Mancel Bale   Medical Decision Making:  This patient is presenting for evaluation of chest pain, which does require a range of treatment options, and is a complaint that involves a moderate risk of morbidity and mortality. The differential diagnoses include chest pain, anxiety, acute pulmonary disorder. I decided to review old records, and in summary elderly female, in a nursing care facility, for rehab following hospital discharge.  She became upset, when she saw a "cockroach."  This is apparently happened more than once since she was placed in the facility, 2 days ago.  I obtained additional historical information from her daughter Domingo Mend, by telephone.  Clinical Laboratory Tests Ordered, included CBC, Metabolic panel, and delta troponin . Review indicates normal except hemoglobin low, sodium low, glucose high, BUN high, creatinine high, GFR low. Radiologic Tests Ordered, included chest x-ray.  I independently Visualized: Radiographic images, which show no acute abnormalities   Critical Interventions-clinical evaluation, laboratory testing, radiography, observation, discussion with patient's family members, consultation with nursing staff, both ED charge nurse, and house [facility].  After These Interventions, the Patient was reevaluated and was found stable for discharge.  Patient with likely anxiety reaction after seeing a bug, in her room.  There is no indication for acute coronary or pulmonary problem requiring further evaluation in the ED.  She has  chronic mild blood abnormalities which are new baseline.  She can be treated symptomatically  with usual medications at her current facility.  There is no indication to keep her here.  Family members have essentially stated that the patient would return here if she saw another bug, and developed chest pain and/or hypoxia.  CRITICAL CARE-no Performed by: Mancel Bale  Nursing Notes Reviewed/ Care Coordinated Applicable Imaging Reviewed Interpretation of Laboratory Data incorporated into ED treatment  The patient appears reasonably screened and/or stabilized for discharge and I doubt any other medical condition or other [facility] requiring further screening, evaluation, or treatment in the ED at this time prior to discharge.  Plan: Home Medications-continue usual; Home Treatments-rest, fluid; return here if the recommended treatment, does not improve the symptoms; Recommended follow up-PCP, PRN     Final Clinical Impression(s) / ED Diagnoses Final diagnoses:  Nonspecific chest pain    Rx / DC Orders ED Discharge Orders     None        Mancel Bale, MD 12/21/20 2322

## 2020-12-21 NOTE — ED Notes (Signed)
Family states don't be surprised if pt come back, I told her pt would be seen again but there was not a reason to admit her and she cannot stay here.

## 2020-12-21 NOTE — Discharge Instructions (Addendum)
The testing today does not show any acute problems with your heart or lungs.  This is reassuring that you do not have any problems that require hospitalization or further evaluation and/or treatment in the emergency department.  Continue taking your usual medications.  Continue to work with the social workers who are trying to place you in a different nursing care facility.

## 2020-12-21 NOTE — ED Triage Notes (Signed)
Pt with c/o CP after seeing a bug.  Pt is from [place] and transported here by [facility].  Pt with mid CP at present .

## 2020-12-22 NOTE — ED Notes (Addendum)
Pt called for "her nurse" to come to her room. Pt reports that her room at [facility] had two bugs in it and they were flying around the room, that staff will not bring her water, and that staff will not help her ambulate to the bathroom. Pt reviewed events of her recent hospitalization and events leading up to the hospitalization as well as SNF placement.  Pt also expresses dissatisfaction with "the way the doctor spoke to" her in the ED.  Advised pt that this nurse did not witness her interaction with the physician and cannot answer for what was said or the manner in which it was said. Pt tearful.

## 2020-12-22 NOTE — ED Notes (Addendum)
Pt left facility in care of [facility] via stretcher en route to [facility]. Charge nurse updated on pts events.

## 2020-12-22 NOTE — ED Notes (Signed)
Upon presentation to room pt is on speaker phone with her daughter who reports pt is having chest pain. Advised pt and daughter that it is most likely anxiety related chest pain and that physician will be notified.  Dr. Effie Shy notified and went to pts room and spoke with pt and daughter. Dr. Effie Shy offered pt pain medication and pt and daughter refused. Daughter requests that pt stay at this facility for 5-6 hours until she can come to pick her up. This nurse, as well as, Dr. Effie Shy advised daughter that pt can be picked up at [facility]. Report then called to [facility] and advised of the events of the evening. [facility] then came to transport pt to [place], report given to [facility].

## 2020-12-22 NOTE — ED Notes (Signed)
This nurse spoke with pts daughter Sierra Guzman via phone who reports she is a Engineer, civil (consulting). Sierra Guzman reports pt would like pure wick removed and prefers to ambulate to the bathroom with a walker but will require oxygen to do so. Sierra Guzman reports that pt does not have access to a call bell to call staff for assistance. Caller reports that pt does not want to return to [facility] due to concerns for bugs in patients room. Advised Sierra Guzman that if pt is discharged from ED that pt will need to return to [facility] or her family would need to come pick her up. Advised Sierra Guzman that pts testing is not complete at this time and that discharge destination can be discussed when care is completed in ED.  This nurse presented to pts room and confirmed that pt did not want the pure wick, pure wick removed. Facility call bell is at pts immediate left, within reach. Call bell pointed out to pt and instruction provided to pt on how to request staff assistance. Advised pt to call prior to getting up or out of bed. Education on call bell operation reinforced. Department walker placed in room for future use. Pt verbalizes understanding.

## 2020-12-27 ENCOUNTER — Other Ambulatory Visit: Payer: Self-pay

## 2020-12-27 ENCOUNTER — Encounter (HOSPITAL_COMMUNITY): Payer: Self-pay

## 2020-12-27 ENCOUNTER — Inpatient Hospital Stay (HOSPITAL_COMMUNITY)
Admission: EM | Admit: 2020-12-27 | Discharge: 2021-01-03 | DRG: 682 | Disposition: A | Discharge status: Skilled Nursing Facility | Payer: Medicare HMO | Source: Skilled Nursing Facility | Attending: Internal Medicine | Admitting: Internal Medicine

## 2020-12-27 ENCOUNTER — Emergency Department (HOSPITAL_COMMUNITY): Payer: Medicare HMO

## 2020-12-27 DIAGNOSIS — Z96643 Presence of artificial hip joint, bilateral: Secondary | ICD-10-CM | POA: Diagnosis present

## 2020-12-27 DIAGNOSIS — L89159 Pressure ulcer of sacral region, unspecified stage: Secondary | ICD-10-CM | POA: Diagnosis present

## 2020-12-27 DIAGNOSIS — I5032 Chronic diastolic (congestive) heart failure: Secondary | ICD-10-CM | POA: Diagnosis not present

## 2020-12-27 DIAGNOSIS — I2510 Atherosclerotic heart disease of native coronary artery without angina pectoris: Secondary | ICD-10-CM | POA: Diagnosis present

## 2020-12-27 DIAGNOSIS — N1832 Chronic kidney disease, stage 3b: Secondary | ICD-10-CM | POA: Diagnosis present

## 2020-12-27 DIAGNOSIS — I130 Hypertensive heart and chronic kidney disease with heart failure and stage 1 through stage 4 chronic kidney disease, or unspecified chronic kidney disease: Secondary | ICD-10-CM | POA: Diagnosis present

## 2020-12-27 DIAGNOSIS — J84112 Idiopathic pulmonary fibrosis: Secondary | ICD-10-CM | POA: Diagnosis present

## 2020-12-27 DIAGNOSIS — E860 Dehydration: Secondary | ICD-10-CM | POA: Diagnosis present

## 2020-12-27 DIAGNOSIS — N183 Chronic kidney disease, stage 3 unspecified: Secondary | ICD-10-CM

## 2020-12-27 DIAGNOSIS — L89156 Pressure-induced deep tissue damage of sacral region: Secondary | ICD-10-CM | POA: Diagnosis present

## 2020-12-27 DIAGNOSIS — Z885 Allergy status to narcotic agent status: Secondary | ICD-10-CM

## 2020-12-27 DIAGNOSIS — Z79899 Other long term (current) drug therapy: Secondary | ICD-10-CM

## 2020-12-27 DIAGNOSIS — E1165 Type 2 diabetes mellitus with hyperglycemia: Secondary | ICD-10-CM

## 2020-12-27 DIAGNOSIS — J449 Chronic obstructive pulmonary disease, unspecified: Secondary | ICD-10-CM | POA: Diagnosis present

## 2020-12-27 DIAGNOSIS — Z96652 Presence of left artificial knee joint: Secondary | ICD-10-CM | POA: Diagnosis present

## 2020-12-27 DIAGNOSIS — Z803 Family history of malignant neoplasm of breast: Secondary | ICD-10-CM

## 2020-12-27 DIAGNOSIS — Z8719 Personal history of other diseases of the digestive system: Secondary | ICD-10-CM

## 2020-12-27 DIAGNOSIS — E1122 Type 2 diabetes mellitus with diabetic chronic kidney disease: Secondary | ICD-10-CM | POA: Diagnosis present

## 2020-12-27 DIAGNOSIS — D631 Anemia in chronic kidney disease: Secondary | ICD-10-CM | POA: Diagnosis present

## 2020-12-27 DIAGNOSIS — D649 Anemia, unspecified: Secondary | ICD-10-CM | POA: Diagnosis not present

## 2020-12-27 DIAGNOSIS — D638 Anemia in other chronic diseases classified elsewhere: Secondary | ICD-10-CM | POA: Diagnosis present

## 2020-12-27 DIAGNOSIS — E119 Type 2 diabetes mellitus without complications: Secondary | ICD-10-CM

## 2020-12-27 DIAGNOSIS — Z7984 Long term (current) use of oral hypoglycemic drugs: Secondary | ICD-10-CM

## 2020-12-27 DIAGNOSIS — Z20822 Contact with and (suspected) exposure to covid-19: Secondary | ICD-10-CM | POA: Diagnosis present

## 2020-12-27 DIAGNOSIS — M109 Gout, unspecified: Secondary | ICD-10-CM | POA: Diagnosis present

## 2020-12-27 DIAGNOSIS — I959 Hypotension, unspecified: Secondary | ICD-10-CM | POA: Diagnosis present

## 2020-12-27 DIAGNOSIS — L308 Other specified dermatitis: Secondary | ICD-10-CM | POA: Diagnosis present

## 2020-12-27 DIAGNOSIS — J9621 Acute and chronic respiratory failure with hypoxia: Secondary | ICD-10-CM | POA: Diagnosis present

## 2020-12-27 DIAGNOSIS — M1990 Unspecified osteoarthritis, unspecified site: Secondary | ICD-10-CM | POA: Diagnosis present

## 2020-12-27 DIAGNOSIS — N200 Calculus of kidney: Secondary | ICD-10-CM | POA: Diagnosis present

## 2020-12-27 DIAGNOSIS — I10 Essential (primary) hypertension: Secondary | ICD-10-CM

## 2020-12-27 DIAGNOSIS — N179 Acute kidney failure, unspecified: Secondary | ICD-10-CM | POA: Diagnosis not present

## 2020-12-27 DIAGNOSIS — Z823 Family history of stroke: Secondary | ICD-10-CM

## 2020-12-27 DIAGNOSIS — D509 Iron deficiency anemia, unspecified: Secondary | ICD-10-CM | POA: Diagnosis present

## 2020-12-27 DIAGNOSIS — J9611 Chronic respiratory failure with hypoxia: Secondary | ICD-10-CM | POA: Diagnosis not present

## 2020-12-27 DIAGNOSIS — Z833 Family history of diabetes mellitus: Secondary | ICD-10-CM

## 2020-12-27 DIAGNOSIS — N1831 Chronic kidney disease, stage 3a: Secondary | ICD-10-CM

## 2020-12-27 DIAGNOSIS — K219 Gastro-esophageal reflux disease without esophagitis: Secondary | ICD-10-CM | POA: Diagnosis present

## 2020-12-27 DIAGNOSIS — R32 Unspecified urinary incontinence: Secondary | ICD-10-CM | POA: Diagnosis present

## 2020-12-27 DIAGNOSIS — N281 Cyst of kidney, acquired: Secondary | ICD-10-CM | POA: Diagnosis present

## 2020-12-27 DIAGNOSIS — R195 Other fecal abnormalities: Secondary | ICD-10-CM | POA: Diagnosis present

## 2020-12-27 DIAGNOSIS — E785 Hyperlipidemia, unspecified: Secondary | ICD-10-CM | POA: Diagnosis present

## 2020-12-27 DIAGNOSIS — K922 Gastrointestinal hemorrhage, unspecified: Secondary | ICD-10-CM | POA: Diagnosis present

## 2020-12-27 DIAGNOSIS — L249 Irritant contact dermatitis, unspecified cause: Secondary | ICD-10-CM | POA: Diagnosis present

## 2020-12-27 DIAGNOSIS — T501X5A Adverse effect of loop [high-ceiling] diuretics, initial encounter: Secondary | ICD-10-CM | POA: Diagnosis present

## 2020-12-27 DIAGNOSIS — E1142 Type 2 diabetes mellitus with diabetic polyneuropathy: Secondary | ICD-10-CM | POA: Diagnosis present

## 2020-12-27 HISTORY — DX: Idiopathic pulmonary fibrosis: J84.112

## 2020-12-27 HISTORY — DX: Chronic diastolic (congestive) heart failure: I50.32

## 2020-12-27 LAB — CBC WITH DIFFERENTIAL/PLATELET
Abs Immature Granulocytes: 0.03 10*3/uL (ref 0.00–0.07)
Basophils Absolute: 0 10*3/uL (ref 0.0–0.1)
Basophils Relative: 0 %
Eosinophils Absolute: 0.3 10*3/uL (ref 0.0–0.5)
Eosinophils Relative: 4 %
HCT: 28 % — ABNORMAL LOW (ref 36.0–46.0)
Hemoglobin: 8.3 g/dL — ABNORMAL LOW (ref 12.0–15.0)
Immature Granulocytes: 0 %
Lymphocytes Relative: 25 %
Lymphs Abs: 2.3 10*3/uL (ref 0.7–4.0)
MCH: 28.7 pg (ref 26.0–34.0)
MCHC: 29.6 g/dL — ABNORMAL LOW (ref 30.0–36.0)
MCV: 96.9 fL (ref 80.0–100.0)
Monocytes Absolute: 0.7 10*3/uL (ref 0.1–1.0)
Monocytes Relative: 7 %
Neutro Abs: 5.7 10*3/uL (ref 1.7–7.7)
Neutrophils Relative %: 64 %
Platelets: 273 10*3/uL (ref 150–400)
RBC: 2.89 MIL/uL — ABNORMAL LOW (ref 3.87–5.11)
RDW: 20.9 % — ABNORMAL HIGH (ref 11.5–15.5)
WBC: 9 10*3/uL (ref 4.0–10.5)
nRBC: 0 % (ref 0.0–0.2)

## 2020-12-27 LAB — URINALYSIS, ROUTINE W REFLEX MICROSCOPIC
Bilirubin Urine: NEGATIVE
Glucose, UA: NEGATIVE mg/dL
Hgb urine dipstick: NEGATIVE
Ketones, ur: NEGATIVE mg/dL
Nitrite: NEGATIVE
Protein, ur: NEGATIVE mg/dL
Specific Gravity, Urine: 1.01 (ref 1.005–1.030)
pH: 6 (ref 5.0–8.0)

## 2020-12-27 LAB — POC OCCULT BLOOD, ED: Fecal Occult Bld: POSITIVE — AB

## 2020-12-27 LAB — TYPE AND SCREEN
ABO/RH(D): O POS
Antibody Screen: NEGATIVE

## 2020-12-27 LAB — URINALYSIS, MICROSCOPIC (REFLEX)

## 2020-12-27 LAB — COMPREHENSIVE METABOLIC PANEL
ALT: 8 U/L (ref 0–44)
AST: 24 U/L (ref 15–41)
Albumin: 2.8 g/dL — ABNORMAL LOW (ref 3.5–5.0)
Alkaline Phosphatase: 44 U/L (ref 38–126)
Anion gap: 10 (ref 5–15)
BUN: 29 mg/dL — ABNORMAL HIGH (ref 8–23)
CO2: 23 mmol/L (ref 22–32)
Calcium: 9 mg/dL (ref 8.9–10.3)
Chloride: 103 mmol/L (ref 98–111)
Creatinine, Ser: 1.91 mg/dL — ABNORMAL HIGH (ref 0.44–1.00)
GFR, Estimated: 26 mL/min — ABNORMAL LOW (ref >60)
Glucose, Bld: 136 mg/dL — ABNORMAL HIGH (ref 70–99)
Potassium: 4.5 mmol/L (ref 3.5–5.1)
Sodium: 136 mmol/L (ref 135–145)
Total Bilirubin: 0.4 mg/dL (ref 0.3–1.2)
Total Protein: 5.8 g/dL — ABNORMAL LOW (ref 6.5–8.1)

## 2020-12-27 LAB — LACTIC ACID, PLASMA: Lactic Acid, Venous: 1.5 mmol/L (ref 0.5–1.9)

## 2020-12-27 LAB — RESP PANEL BY RT-PCR (FLU A&B, COVID) ARPGX2
Influenza A by PCR: NEGATIVE
Influenza B by PCR: NEGATIVE
SARS Coronavirus 2 by RT PCR: NEGATIVE

## 2020-12-27 LAB — BRAIN NATRIURETIC PEPTIDE: B Natriuretic Peptide: 32.1 pg/mL (ref 0.0–100.0)

## 2020-12-27 MED ORDER — ONDANSETRON HCL 4 MG/2ML IJ SOLN: 4.0000 mg | Freq: Four times a day (QID) | INTRAMUSCULAR | Status: DC | PRN

## 2020-12-27 MED ORDER — ONDANSETRON HCL 4 MG PO TABS: 4.0000 mg | ORAL_TABLET | Freq: Four times a day (QID) | ORAL | Status: DC | PRN

## 2020-12-27 MED ORDER — LACTATED RINGERS IV BOLUS
500.0000 mL | Freq: Once | INTRAVENOUS | Status: AC
  Administered 2020-12-27: 500 mL via INTRAVENOUS

## 2020-12-27 MED ORDER — POLYETHYLENE GLYCOL 3350 17 G PO PACK
17.0000 g | PACK | Freq: Every day | ORAL | Status: DC
  Administered 2020-12-28 – 2021-01-02 (×3): 17 g via ORAL
  Filled 2020-12-27 (×8): qty 1

## 2020-12-27 MED ORDER — MOMETASONE FURO-FORMOTEROL FUM 200-5 MCG/ACT IN AERO
2.0000 | INHALATION_SPRAY | Freq: Two times a day (BID) | RESPIRATORY_TRACT | Status: DC
  Administered 2020-12-28 – 2021-01-03 (×13): 2 via RESPIRATORY_TRACT
  Filled 2020-12-27: qty 8.8

## 2020-12-27 MED ORDER — ACETAMINOPHEN 650 MG RE SUPP: 650.0000 mg | Freq: Four times a day (QID) | RECTAL | Status: DC | PRN

## 2020-12-27 MED ORDER — ACETAMINOPHEN 325 MG PO TABS: 650.0000 mg | ORAL_TABLET | Freq: Four times a day (QID) | ORAL | Status: DC | PRN

## 2020-12-27 MED ORDER — GABAPENTIN 300 MG PO CAPS
600.0000 mg | ORAL_CAPSULE | Freq: Two times a day (BID) | ORAL | Status: DC
  Administered 2020-12-28 – 2021-01-03 (×11): 600 mg via ORAL
  Filled 2020-12-27 (×11): qty 2

## 2020-12-27 MED ORDER — ALLOPURINOL 300 MG PO TABS
300.0000 mg | ORAL_TABLET | Freq: Every day | ORAL | Status: DC
  Administered 2020-12-28 – 2021-01-03 (×7): 300 mg via ORAL
  Filled 2020-12-27 (×7): qty 1

## 2020-12-27 MED ORDER — FERROUS GLUCONATE 324 (38 FE) MG PO TABS
324.0000 mg | ORAL_TABLET | Freq: Every day | ORAL | Status: DC
  Administered 2020-12-28 – 2020-12-29 (×2): 324 mg via ORAL
  Filled 2020-12-27 (×2): qty 1

## 2020-12-27 MED ORDER — PREDNISONE 10 MG PO TABS
5.0000 mg | ORAL_TABLET | Freq: Every day | ORAL | Status: AC
  Administered 2020-12-28 – 2020-12-30 (×3): 5 mg via ORAL
  Filled 2020-12-27 (×5): qty 1

## 2020-12-27 MED ORDER — UMECLIDINIUM BROMIDE 62.5 MCG/INH IN AEPB
1.0000 | INHALATION_SPRAY | Freq: Every day | RESPIRATORY_TRACT | Status: DC
  Administered 2020-12-28 – 2021-01-03 (×7): 1 via RESPIRATORY_TRACT
  Filled 2020-12-27: qty 7

## 2020-12-27 MED ORDER — METOPROLOL SUCCINATE ER 25 MG PO TB24: 25.0000 mg | ORAL_TABLET | Freq: Every day | ORAL | Status: DC

## 2020-12-27 MED ORDER — INSULIN ASPART 100 UNIT/ML IJ SOLN
0.0000 [IU] | Freq: Three times a day (TID) | INTRAMUSCULAR | Status: DC
  Administered 2020-12-28 (×2): 1 [IU] via SUBCUTANEOUS
  Administered 2020-12-30: 3 [IU] via SUBCUTANEOUS
  Administered 2020-12-31 – 2021-01-02 (×3): 2 [IU] via SUBCUTANEOUS
  Administered 2021-01-02: 3 [IU] via SUBCUTANEOUS
  Administered 2021-01-03: 2 [IU] via SUBCUTANEOUS

## 2020-12-27 MED ORDER — ALBUTEROL SULFATE (2.5 MG/3ML) 0.083% IN NEBU: 2.5000 mg | INHALATION_SOLUTION | Freq: Four times a day (QID) | RESPIRATORY_TRACT | Status: DC | PRN

## 2020-12-27 MED ORDER — ROSUVASTATIN CALCIUM 20 MG PO TABS
40.0000 mg | ORAL_TABLET | Freq: Every day | ORAL | Status: DC
  Administered 2020-12-28 – 2021-01-03 (×7): 40 mg via ORAL
  Filled 2020-12-27 (×8): qty 2

## 2020-12-27 MED ORDER — ASPIRIN EC 81 MG PO TBEC: 81.0000 mg | DELAYED_RELEASE_TABLET | Freq: Every day | ORAL | Status: DC

## 2020-12-27 MED ORDER — GABAPENTIN 300 MG PO CAPS
900.0000 mg | ORAL_CAPSULE | Freq: Every day | ORAL | Status: DC
  Administered 2020-12-28 – 2021-01-02 (×6): 900 mg via ORAL
  Filled 2020-12-27 (×6): qty 3

## 2020-12-27 MED ORDER — HEPARIN SODIUM (PORCINE) 5000 UNIT/ML IJ SOLN: 5000.0000 [IU] | Freq: Three times a day (TID) | INTRAMUSCULAR | Status: DC

## 2020-12-27 MED ORDER — DOCUSATE SODIUM 100 MG PO CAPS
100.0000 mg | ORAL_CAPSULE | Freq: Two times a day (BID) | ORAL | Status: DC
  Administered 2020-12-28 – 2021-01-02 (×6): 100 mg via ORAL
  Filled 2020-12-27 (×12): qty 1

## 2020-12-27 MED ORDER — PANTOPRAZOLE SODIUM 40 MG PO TBEC
80.0000 mg | DELAYED_RELEASE_TABLET | Freq: Every day | ORAL | Status: DC
  Administered 2020-12-28 – 2020-12-31 (×4): 80 mg via ORAL
  Filled 2020-12-27 (×5): qty 2

## 2020-12-27 NOTE — ED Provider Notes (Signed)
[facility] EMERGENCY DEPARTMENT Provider Note   CSN: [id number] Arrival date & time: 12/27/20  1719     History No chief complaint on file.   Sierra Guzman is a 80 y.o. female.  HPI Patient presents from nursing home with generalized weakness.  States she is having difficulty lifting things because both arms and legs feel weak.  States that she if she would lift a coffee cup she would probably drop with either arm.  Not lateralizing numbness weakness.  States she has been a little shaky.  History of anemia.  Recent admission and had iron infusion.  Also has had previous transfusions.  Patient states she was previously told she needed a transfusion but had refused it.  States she will get it now.  Sent in for hemoglobin of 7.4.  Patient states she is at [facility] for rehab.  States now she would not be able to get up and walk around would normally be able to do that.  States she was able to get up and walk with rehab.  No fevers.  No chills.  Does have a decubitus ulcer on her sacral area.    Past Medical History:  Diagnosis Date   Anemia 10/28/2010   unspecified   Anxiety    Aortic valve sclerosis    Arthritis    Asthma    Chronic kidney disease    Chronic Kidney Disease---Stage III   Coronary artery disease 10/28/2010   Cystic fibrosis of the lung (HCC)    DDD (degenerative disc disease), lumbar 2006   Diabetes mellitus without complication (HCC)    Diabetes type 2, controlled (HCC) 10/28/2010   Dyspnea    GERD (gastroesophageal reflux disease)    Gout 10/28/2010   Heart abnormality    Heart murmur    History of cardiac catheterization 2006   LAD 50%, D1 50% rest normal.    History of cardiovascular stress test 09/23/2009    lexiscan Normal Cardiolite test, EF 61%. LV size and function - normal   History of echocardiogram 05/27/2008   EF 55%; Mild aortic insufficiency with minimal aortic sclerosis. L vent normal.    Hyperlipidemia    Hyperlipidemia  LDL goal <100 10/28/2010   managed by Dr. 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   Localized, primary osteoarthritis 12/21/2020   Low back pain 12/21/2020   Pain in limb 12/21/2020   Acute on chronic respiratory failure with hypoxemia (HCC) 12/05/2020   Acute respiratory failure with hypoxia (HCC) 12/05/2020   Hip pain 08/06/2020   Spinal stenosis in cervical region 08/06/2020   Spondylolisthesis, congenital 08/06/2020   Status post total hip replacement, 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   Primary localized osteoarthritis of right hip 12/12/2016   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   B12 deficiency 10/28/2010   Coronary artery disease 10/28/2010   Diabetes type 2, controlled (HCC) 10/28/2010   Gout 10/28/2010   Hyperlipidemia with target LDL  less than 100 10/28/2010   Hypertension 10/28/2010   Osteoarthritis of left knee 10/28/2010   Anemia 10/28/2010   History of colonic polyps 10/28/2010    Past Surgical History:  Procedure Laterality Date   BACK SURGERY  2006   DDD   BREAST BIOPSY Right    neg   CARDIAC CATHETERIZATION  2006   EYE SURGERY Bilateral    Cataract Extraction with IOL   JOINT REPLACEMENT Left    Left Total Hip Replacement   JOINT REPLACEMENT     RIGHT 2005; LEFT 2 X 2004, 2011   REPLACEMENT TOTAL KNEE Left    REVISION TOTAL KNEE ARTHROPLASTY Left    Dr. Ernest Pine   TONSILLECTOMY     TOTAL HIP ARTHROPLASTY Right 12/12/2016    Procedure: TOTAL HIP ARTHROPLASTY ANTERIOR APPROACH;  Surgeon: Kennedy Bucker, MD;  Location: [facility] ORS;  Service: Orthopedics;  Laterality: Right;   TOTAL HIP ARTHROPLASTY Left 12/26/2013   Procedure: ARTHROPLASTY HIP TOTAL; Surgeon: Christell Faith, MD; Location: [facility] OR; Service: Orthopedics; Laterality: left,    TOTAL KNEE ARTHROPLASTY     both knees     OB History     Gravida  5   Para  5   Term      Preterm      AB      Living  5      SAB      IAB      Ectopic      Multiple      Live Births              Family History  Problem Relation Age of Onset   Breast cancer Mother 19   Diabetes Mother    Stroke Mother    Stroke Brother    Breast cancer Cousin        pat cousin    Social History   Tobacco Use   Smoking status: Former    Packs/day: 0.25    Years: 10.00    Pack years: 2.50    Types: Cigarettes    Quit date: 08/18/1990    Years since quitting: 30.3   Smokeless tobacco: Never  Vaping Use   Vaping Use: Never used  Substance Use Topics   Alcohol use: No   Drug use: No    Home Medications Prior to Admission medications   Medication Sig Start Date End Date Taking? Authorizing Provider  albuterol (VENTOLIN HFA) 108 (90 Base) MCG/ACT inhaler Inhale 1-2 puffs into the lungs every 6 (six) hours as needed for wheezing or shortness of breath.   Yes [provider]  allopurinol (ZYLOPRIM) 300 MG tablet Take 300 mg by mouth daily.    Yes [provider]  aspirin EC 81 MG tablet Take 81 mg by mouth daily.   Yes [provider]  candesartan (ATACAND) 32 MG tablet Take 32 mg by mouth daily. 10/01/18  Yes [provider]  cholecalciferol (VITAMIN D) 1000 units tablet Take 1,000 Units by mouth daily.   Yes [provider]  Cinnamon 500 MG capsule Take 500 mg by mouth daily.   Yes [provider]  docusate sodium (COLACE) 100 MG capsule Take 1 capsule (100 mg total) by mouth 2 (two) times  daily. 12/14/16  Yes Evon Slack, PA-C  ferrous gluconate (FERGON) 324 MG tablet Take 324 mg by mouth daily with breakfast. 12/28/13  Yes [provider]  Fluticasone-Salmeterol (ADVAIR) 500-50 MCG/DOSE AEPB Inhale 1 puff into the lungs every  12 (twelve) hours.   Yes [provider]  furosemide (LASIX) 20 MG tablet Take 1 tablet (20 mg total) by mouth daily. 12/19/20  Yes Burnadette Pop, MD  gabapentin (NEURONTIN) 300 MG capsule Take 600-900 mg by mouth See admin instructions. Take 600mg  at 8am, 600mg  at 2pm, and 900mg  at bedtime 03/02/18  Yes [provider]  INCRUSE ELLIPTA 62.5 MCG/INH AEPB Inhale 1 puff into the lungs daily. 12/01/20  Yes [provider]  loratadine (CLARITIN) 10 MG tablet Take 10 mg by mouth daily.   Yes [provider]  magnesium oxide (MAG-OX) 400 MG tablet Take 1 tablet by mouth daily. 07/21/20  Yes [provider]  metFORMIN (GLUCOPHAGE) 850 MG tablet Take 1 tablet (850 mg total) by mouth 2 (two) times daily with a meal. Patient taking differently: Take 500 mg by mouth 2 (two) times daily with a meal. 12/19/20 12/19/21 Yes Adhikari, 07/23/20, MD  metoprolol succinate (TOPROL-XL) 25 MG 24 hr tablet Take 1 tablet (25 mg total) by mouth daily. 12/19/20  Yes 02/18/22, MD  Multiple Vitamin (MULTI-VITAMIN) tablet Take 1 tablet by mouth as needed.   Yes [provider]  Omega-3 Fatty Acids (FISH OIL) 1000 MG CAPS Take 1 capsule by mouth daily.    Yes [provider]  omeprazole (PRILOSEC) 20 MG capsule Take 20 mg by mouth daily. 11/05/20  Yes [provider]  polyethylene glycol (MIRALAX / GLYCOLAX) 17 g packet Take 17 g by mouth daily. 12/19/20  Yes Burnadette Pop, MD  predniSONE (DELTASONE) 10 MG tablet Take 1 tablet (10 mg total) by mouth daily with breakfast for 3 days. 12/25/20 12/28/20 Yes Burnadette Pop, MD  rosuvastatin (CRESTOR) 40 MG tablet Take 40 mg by mouth daily. 09/17/20  Yes [provider]  vitamin B-12 (CYANOCOBALAMIN) 1000 MCG tablet Take 1,000 mcg by mouth daily.   Yes [provider]  Vitamin D, Ergocalciferol, (DRISDOL) 1.25 MG (50000 UNIT) CAPS capsule Take 1 capsule (50,000 Units total) by mouth every 7 (seven) days. 12/22/20  Yes Burnadette Pop, MD  acetaminophen (TYLENOL) 500 MG tablet Take 2 tablets (1,000 mg total) by mouth every 6 (six) hours as needed for mild pain (or Fever >/= 101). Patient not taking: No sig reported 12/14/16   12/24/20, PA-C  Blood Glucose Monitoring Suppl (GLUCOCOM BLOOD GLUCOSE MONITOR) DEVI See admin instructions. 04/23/19   [provider]  guaiFENesin-dextromethorphan (ROBITUSSIN DM) 100-10 MG/5ML syrup Take 5 mLs by mouth every 4 (four) hours as needed for cough. Patient not taking: No sig reported 12/19/20   Evon Slack, MD  predniSONE (DELTASONE) 5 MG tablet Take 1 tablet (5 mg total) by mouth daily with breakfast for 3 days. Patient not taking: No sig reported 12/28/20 12/31/20  Burnadette Pop, MD  enoxaparin (LOVENOX) 40 MG/0.4ML injection Inject 0.4 mLs (40 mg total) into the skin daily. 12/15/16 03/25/19  Burnadette Pop, PA-C  pregabalin (LYRICA) 50 MG capsule Take 1 capsule (50 mg total) by mouth 3 (three) times daily. 12/18/16 03/25/19  Evon Slack, NP  ranitidine (ZANTAC) 300 MG capsule Take 300 mg by mouth every evening.  03/25/19  [provider]  simvastatin (ZOCOR) 40 MG tablet Take 40 mg by mouth daily at 6 PM.  03/25/19  [provider]    Allergies    Morphine and related  Review of Systems   Review of Systems  Constitutional:  Positive for fatigue. Negative for appetite change.  HENT:  Negative for congestion.  Respiratory:  Positive for shortness of breath.   Cardiovascular:  Negative for leg swelling.  Gastrointestinal:  Negative for blood in stool.  Endocrine: Negative for polyuria.  Genitourinary:  Negative for flank pain.  Musculoskeletal:  Negative for  back pain.  Skin:  Positive for wound.  Neurological:  Positive for weakness.  Psychiatric/Behavioral:  Negative for confusion.    Physical Exam Updated Vital Signs BP 101/82   Pulse 74   Temp 100.1 F (37.8 C) (Rectal)   Resp 18   SpO2 100%   Physical Exam Vitals and nursing note reviewed.  HENT:     Head: Atraumatic.  Eyes:     Extraocular Movements: Extraocular movements intact.  Cardiovascular:     Rate and Rhythm: Regular rhythm.  Abdominal:     Tenderness: There is no abdominal tenderness.  Musculoskeletal:        General: No tenderness.     Cervical back: Neck supple.  Skin:    General: Skin is warm.     Capillary Refill: Capillary refill takes less than 2 seconds.     Comments: Rather superficial decubitus ulcer on sacral area.  Goes through superficial levels of skin but not all the way through the skin.  Has mild surrounding erythema but no induration  Neurological:     Mental Status: She is alert.  Psychiatric:        Mood and Affect: Mood normal.    ED Results / Procedures / Treatments   Labs (all labs ordered are listed, but only abnormal results are displayed) Labs Reviewed  COMPREHENSIVE METABOLIC PANEL - Abnormal; Notable for the following components:      Result Value   Glucose, Bld 136 (*)    BUN 29 (*)    Creatinine, Ser 1.91 (*)    Total Protein 5.8 (*)    Albumin 2.8 (*)    GFR, Estimated 26 (*)    All other components within normal limits  CBC WITH DIFFERENTIAL/PLATELET - Abnormal; Notable for the following components:   RBC 2.89 (*)    Hemoglobin 8.3 (*)    HCT 28.0 (*)    MCHC 29.6 (*)    RDW 20.9 (*)    All other components within normal limits  URINALYSIS, ROUTINE W REFLEX MICROSCOPIC - Abnormal; Notable for the following components:   Leukocytes,Ua MODERATE (*)    All other components within normal limits  URINALYSIS, MICROSCOPIC (REFLEX) - Abnormal; Notable for the following components:   Bacteria, UA RARE (*)    Non Squamous  Epithelial PRESENT (*)    All other components within normal limits  POC OCCULT BLOOD, ED - Abnormal; Notable for the following components:   Fecal Occult Bld POSITIVE (*)    All other components within normal limits  RESP PANEL BY RT-PCR (FLU A&B, COVID) ARPGX2  CULTURE, BLOOD (ROUTINE X 2)  CULTURE, BLOOD (ROUTINE X 2)  LACTIC ACID, PLASMA  BRAIN NATRIURETIC PEPTIDE  LACTIC ACID, PLASMA  TYPE AND SCREEN    EKG None  Radiology DG Chest Portable 1 View  Result Date: 12/27/2020 CLINICAL DATA:  Generalized weakness and tremors. EXAM: PORTABLE CHEST 1 VIEW COMPARISON:  December 21, 2020 FINDINGS: Stable, chronic interstitial lung disease is seen throughout both lungs. There is no evidence of focal consolidation, pleural effusion or pneumothorax. The heart size and mediastinal contours are within normal limits. The visualized skeletal structures are unremarkable. IMPRESSION: Chronic interstitial lung disease without evidence of acute or active cardiopulmonary disease. Electronically Signed   By:  Aram Candela M.D.   On: 12/27/2020 18:24    Procedures Procedures   Medications Ordered in ED Medications  lactated ringers bolus 500 mL (0 mLs Intravenous Stopped 12/27/20 2035)    ED Course  I have reviewed the triage vital signs and the nursing notes.  Pertinent labs & imaging results that were available during my care of the patient were reviewed by me and considered in my medical decision making (see chart for details).    MDM Rules/Calculators/A&P                          Patient states her blood pressure always runs low.  States her run in the 52s and 21s even  Patient was sent in for anemia.  Reported hemoglobin is 7.4.  Has had recent blood transfusions and iron infusions.  Hypotensive on arrival but recheck hemoglobin did show hemoglobin 8.3.  This is down from 8.9 after her transfusion and iron.  However was hypotensive.  Fluid boluses been given and improved the blood  pressure.  However is guaiac positive.  States generalized weakness.  States tremors in her arms.  Improved now that her blood pressure is up some.  I think with the hypotension and creatinine worsening she would benefit from mission to the hospital.  Also serial hemoglobins.  Is not febrile.  Urine does not show infection chest x-ray reassuring.  Does have a sacral decubitus ulcer but doubt source of hypotension.  Will admit to unassigned medicine.  CRITICAL CARE Performed by: Benjiman Core Total critical care time: 30 minutes Critical care time was exclusive of separately billable procedures and treating other patients. Critical care was necessary to treat or prevent imminent or life-threatening deterioration. Critical care was time spent personally by me on the following activities: development of treatment plan with patient and/or surrogate as well as nursing, discussions with consultants, evaluation of patient's response to treatment, examination of patient, obtaining history from patient or surrogate, ordering and performing treatments and interventions, ordering and review of laboratory studies, ordering and review of radiographic studies, pulse oximetry and re-evaluation of patient's condition.  Final Clinical Impression(s) / ED Diagnoses Final diagnoses:  AKI (acute kidney injury) (HCC)  Hypotension, unspecified hypotension type  Gastrointestinal hemorrhage, unspecified gastrointestinal hemorrhage type    Rx / DC Orders ED Discharge Orders     None        Benjiman Core, MD 12/27/20 2211

## 2020-12-27 NOTE — ED Notes (Signed)
[name], daughter, [phone] would like an update when available

## 2020-12-27 NOTE — ED Triage Notes (Signed)
Per EMS pt complaining of generalized weakness and bilateral tremors in legs/arms x1day.  Blood work done yesterday. Pt has low hgb of 7.4 with hx of blood transfusions.  BP 110/70 O2 3L 100% HR 82 CBG 124 T 99.1   Per EMS [facility] for therapy.

## 2020-12-27 NOTE — H&P (Signed)
History and Physical    Sierra Guzman RWE:[id number] DOB: 12/10/1940 DOA: 12/27/2020  PCP: System, Provider Not In  Patient coming from: [place]  I have personally briefly reviewed patient's old medical records in [facility]  Chief Complaint: Generalized weakness  HPI: Sierra Guzman is a 80 y.o. female with medical history significant of Progressive IPF with UIP, DM2, chronic hypoxic resp failure.  Pt recently admitted 8/28-9/11 for AEIPF.  Treated with steroid taper.  2d echo during that admission showed grade 1 DD, preserved EF, normal RV function, no mention of pulm art pressure.  Pt had been on Esbriet for a long time prior to that admission but Esbriet has since been stopped by her pulmonologist per patient.  Pt was started on Lasix 20mg  daily during that admission.  Anemia noted during that admission, transfusion offered but pt declined it at that time (though willing to get if needed now).  Pt presents to the ED today with c/o generalized weakness.  Onset ~1 week ago, persistent, nothing makes better or worse.  Severe.  No fevers, chills.   ED Course: HGB 8.3 (was 7.4 at facility yesterday, was 8.9 on 9/13)  Creat 1.9 up from 1.0 on 9/11 discharge.  CXR shows IPF but no acute findings.  BP 80s systolic in ED  Given 500cc IVF bolus.   Review of Systems: As per HPI, otherwise all review of systems negative.  Past Medical History:  Diagnosis Date   Anemia 10/28/2010   unspecified   Anxiety    Aortic valve sclerosis    Arthritis    Asthma    Chronic diastolic CHF (congestive heart failure) (HCC)    Echo 12/2020 = nl EF, Grade 1 DD, normal RV function, no mention of pulm art pressure though   Chronic kidney disease    Chronic Kidney Disease---Stage III   Coronary artery disease 10/28/2010   DDD (degenerative disc disease), lumbar 2006   Diabetes type 2, controlled (HCC) 10/28/2010   Dyspnea    GERD (gastroesophageal reflux disease)    Gout 10/28/2010    Heart murmur    History of cardiac catheterization 2006   LAD 50%, D1 50% rest normal.    History of cardiovascular stress test 09/23/2009    lexiscan Normal Cardiolite test, EF 61%. LV size and function - normal   History of echocardiogram 05/27/2008   EF 55%; Mild aortic insufficiency with minimal aortic sclerosis. L vent normal.    Hyperlipidemia    Hyperlipidemia LDL goal <100 10/28/2010   managed by Dr. 10/30/2010   Hypertension 10/28/2010   IPF (idiopathic pulmonary fibrosis) (HCC)    Progressive, with UIP   Mild vitamin D deficiency 02/22/2011   Obesity (BMI 30-39.9) 10/28/2010   unspecified   Osteoarthritis 10/28/2010   bilateral knees, left hip   Personal history of colonic polyps 10/28/2010   Shortness of breath    Syncope 2013   Vitamin B 12 deficiency 10/28/2010    Past Surgical History:  Procedure Laterality Date   BACK SURGERY  2006   DDD   BREAST BIOPSY Right    neg   CARDIAC CATHETERIZATION  2006   EYE SURGERY Bilateral    Cataract Extraction with IOL   JOINT REPLACEMENT Left    Left Total Hip Replacement   JOINT REPLACEMENT     RIGHT 2005; LEFT 2 X 2004, 2011   REPLACEMENT TOTAL KNEE Left    REVISION TOTAL KNEE ARTHROPLASTY Left    Dr. 2012  TONSILLECTOMY     TOTAL HIP ARTHROPLASTY Right 12/12/2016   Procedure: TOTAL HIP ARTHROPLASTY ANTERIOR APPROACH;  Surgeon: Kennedy Bucker, MD;  Location: [facility] ORS;  Service: Orthopedics;  Laterality: Right;   TOTAL HIP ARTHROPLASTY Left 12/26/2013   Procedure: ARTHROPLASTY HIP TOTAL; Surgeon: Christell Faith, MD; Location: [facility]; Service: Orthopedics; Laterality: left,    TOTAL KNEE ARTHROPLASTY     both knees     reports that she quit smoking about 30 years ago. Her smoking use included cigarettes. She has a 2.50 pack-year smoking history. She has never used smokeless tobacco. She reports that she does not drink alcohol and does not use drugs.  Allergies  Allergen Reactions   Morphine And Related  Nausea And Vomiting    Family History  Problem Relation Age of Onset   Breast cancer Mother 49   Diabetes Mother    Stroke Mother    Stroke Brother    Breast cancer Cousin        pat cousin     Prior to Admission medications   Medication Sig Start Date End Date Taking? Authorizing Provider  albuterol (VENTOLIN HFA) 108 (90 Base) MCG/ACT inhaler Inhale 1-2 puffs into the lungs every 6 (six) hours as needed for wheezing or shortness of breath.   Yes [provider]  allopurinol (ZYLOPRIM) 300 MG tablet Take 300 mg by mouth daily.    Yes [provider]  aspirin EC 81 MG tablet Take 81 mg by mouth daily.   Yes [provider]  candesartan (ATACAND) 32 MG tablet Take 32 mg by mouth daily. 10/01/18  Yes [provider]  cholecalciferol (VITAMIN D) 1000 units tablet Take 1,000 Units by mouth daily.   Yes [provider]  Cinnamon 500 MG capsule Take 500 mg by mouth daily.   Yes [provider]  docusate sodium (COLACE) 100 MG capsule Take 1 capsule (100 mg total) by mouth 2 (two) times daily. 12/14/16  Yes Evon Slack, PA-C  ferrous gluconate (FERGON) 324 MG tablet Take 324 mg by mouth daily with breakfast. 12/28/13  Yes [provider]  Fluticasone-Salmeterol (ADVAIR) 500-50 MCG/DOSE AEPB Inhale 1 puff into the lungs every 12 (twelve) hours.   Yes [provider]  furosemide (LASIX) 20 MG tablet Take 1 tablet (20 mg total) by mouth daily. 12/19/20  Yes Burnadette Pop, MD  gabapentin (NEURONTIN) 300 MG capsule Take 600-900 mg by mouth See admin instructions. Take 600mg  at 8am, 600mg  at 2pm, and 900mg  at bedtime 03/02/18  Yes [provider]  INCRUSE ELLIPTA 62.5 MCG/INH AEPB Inhale 1 puff into the lungs daily. 12/01/20  Yes [provider]  loratadine (CLARITIN) 10 MG tablet Take 10 mg by mouth daily.   Yes [provider]  magnesium oxide (MAG-OX) 400 MG tablet Take 1 tablet by mouth daily.  07/21/20  Yes [provider]  metFORMIN (GLUCOPHAGE) 850 MG tablet Take 1 tablet (850 mg total) by mouth 2 (two) times daily with a meal. Patient taking differently: Take 500 mg by mouth 2 (two) times daily with a meal. 12/19/20 12/19/21 Yes Adhikari, Willia Craze, MD  metoprolol succinate (TOPROL-XL) 25 MG 24 hr tablet Take 1 tablet (25 mg total) by mouth daily. 12/19/20  Yes Burnadette Pop, MD  Multiple Vitamin (MULTI-VITAMIN) tablet Take 1 tablet by mouth as needed.   Yes [provider]  Omega-3 Fatty Acids (FISH OIL) 1000 MG CAPS Take 1 capsule by mouth daily.    Yes [provider]  omeprazole (PRILOSEC) 20 MG capsule Take 20 mg by mouth daily. 11/05/20  Yes [provider]  polyethylene glycol (MIRALAX / GLYCOLAX) 17 g packet Take 17 g by mouth daily. 12/19/20  Yes Burnadette Pop, MD  predniSONE (DELTASONE) 10 MG tablet Take 1 tablet (10 mg total) by mouth daily with breakfast for 3 days. 12/25/20 12/28/20 Yes Burnadette Pop, MD  rosuvastatin (CRESTOR) 40 MG tablet Take 40 mg by mouth daily. 09/17/20  Yes [provider]  vitamin B-12 (CYANOCOBALAMIN) 1000 MCG tablet Take 1,000 mcg by mouth daily.   Yes [provider]  Vitamin D, Ergocalciferol, (DRISDOL) 1.25 MG (50000 UNIT) CAPS capsule Take 1 capsule (50,000 Units total) by mouth every 7 (seven) days. 12/22/20  Yes Burnadette Pop, MD  Blood Glucose Monitoring Suppl (GLUCOCOM BLOOD GLUCOSE MONITOR) DEVI See admin instructions. 04/23/19   [provider]  predniSONE (DELTASONE) 5 MG tablet Take 1 tablet (5 mg total) by mouth daily with breakfast for 3 days. Patient not taking: No sig reported 12/28/20 12/31/20  Burnadette Pop, MD  enoxaparin (LOVENOX) 40 MG/0.4ML injection Inject 0.4 mLs (40 mg total) into the skin daily. 12/15/16 03/25/19  Evon Slack, PA-C  pregabalin (LYRICA) 50 MG capsule Take 1 capsule (50 mg total) by mouth 3 (three) times daily. 12/18/16 03/25/19  Lorenso Quarry, NP   ranitidine (ZANTAC) 300 MG capsule Take 300 mg by mouth every evening.  03/25/19  [provider]  simvastatin (ZOCOR) 40 MG tablet Take 40 mg by mouth daily at 6 PM.  03/25/19  [provider]    Physical Exam: Vitals:   12/27/20 1822 12/27/20 2003 12/27/20 2100 12/27/20 2225  BP: (!) 80/46 (!) 96/53 101/82 (!) 99/51  Pulse: 86 73 74 78  Resp: (!) 22 (!) 24 18 (!) 22  Temp: 100.1 F (37.8 C)   98.7 F (37.1 C)  TempSrc: Rectal   Axillary  SpO2: 100% 95% 100% 99%    Constitutional: NAD, calm, comfortable Eyes: PERRL, lids and conjunctivae normal ENMT: Mucous membranes are moist. Posterior pharynx clear of any exudate or lesions.Normal dentition.  Neck: normal, supple, no masses, no thyromegaly Respiratory: Inspiratory crackles Cardiovascular: Regular rate and rhythm, no murmurs / rubs / gallops. No extremity edema. 2+ pedal pulses. No carotid bruits.  Abdomen: no tenderness, no masses palpated. No hepatosplenomegaly. Bowel sounds positive.  Musculoskeletal: no clubbing / cyanosis. No joint deformity upper and lower extremities. Good ROM, no contractures. Normal muscle tone.  Skin: Sacral decubitus present Neurologic: CN 2-12 grossly intact. Sensation intact, DTR normal. Strength 5/5 in all 4.  Psychiatric: Normal judgment and insight. Alert and oriented x 3. Normal mood.    Labs on Admission: I have personally reviewed following labs and imaging studies  CBC: Recent Labs  Lab 12/21/20 2009 12/27/20 1813  WBC 9.4 9.0  NEUTROABS  --  5.7  HGB 8.9* 8.3*  HCT 29.3* 28.0*  MCV 93.9 96.9  PLT 292 273   Basic Metabolic Panel: Recent Labs  Lab 12/21/20 2009 12/27/20 1813  NA 132* 136  K 4.5 4.5  CL 100 103  CO2 25 23  GLUCOSE 217* 136*  BUN 25* 29*  CREATININE 1.67* 1.91*  CALCIUM 8.9 9.0   GFR: Estimated Creatinine Clearance: 22.6 mL/min (A) (by C-G formula based on SCr of 1.91 mg/dL (H)). Liver Function Tests: Recent Labs  Lab 12/27/20 1813   AST 24  ALT 8  ALKPHOS 44  BILITOT 0.4  PROT 5.8*  ALBUMIN 2.8*  No results for input(s): LIPASE, AMYLASE in the last 168 hours. No results for input(s): AMMONIA in the last 168 hours. Coagulation Profile: No results for input(s): INR, PROTIME in the last 168 hours. Cardiac Enzymes: No results for input(s): CKTOTAL, CKMB, CKMBINDEX, TROPONINI in the last 168 hours. BNP (last 3 results) No results for input(s): PROBNP in the last 8760 hours. HbA1C: No results for input(s): HGBA1C in the last 72 hours. CBG: No results for input(s): GLUCAP in the last 168 hours. Lipid Profile: No results for input(s): CHOL, HDL, LDLCALC, TRIG, CHOLHDL, LDLDIRECT in the last 72 hours. Thyroid Function Tests: No results for input(s): TSH, T4TOTAL, FREET4, T3FREE, THYROIDAB in the last 72 hours. Anemia Panel: No results for input(s): VITAMINB12, FOLATE, FERRITIN, TIBC, IRON, RETICCTPCT in the last 72 hours. Urine analysis:    Component Value Date/Time   COLORURINE YELLOW 12/27/2020 2005   APPEARANCEUR CLEAR 12/27/2020 2005   LABSPEC 1.010 12/27/2020 2005   PHURINE 6.0 12/27/2020 2005   GLUCOSEU NEGATIVE 12/27/2020 2005   HGBUR NEGATIVE 12/27/2020 2005   BILIRUBINUR NEGATIVE 12/27/2020 2005   KETONESUR NEGATIVE 12/27/2020 2005   PROTEINUR NEGATIVE 12/27/2020 2005   NITRITE NEGATIVE 12/27/2020 2005   LEUKOCYTESUR MODERATE (A) 12/27/2020 2005    Radiological Exams on Admission: DG Chest Portable 1 View  Result Date: 12/27/2020 CLINICAL DATA:  Generalized weakness and tremors. EXAM: PORTABLE CHEST 1 VIEW COMPARISON:  December 21, 2020 FINDINGS: Stable, chronic interstitial lung disease is seen throughout both lungs. There is no evidence of focal consolidation, pleural effusion or pneumothorax. The heart size and mediastinal contours are within normal limits. The visualized skeletal structures are unremarkable. IMPRESSION: Chronic interstitial lung disease without evidence of acute or active  cardiopulmonary disease. Electronically Signed   By: Aram Candela M.D.   On: 12/27/2020 18:24    EKG: Independently reviewed.  Assessment/Plan Principal Problem:   AKI (acute kidney injury) (HCC) Active Problems:   Diabetes type 2, controlled (HCC)   Hypertension   Chronic respiratory failure with hypoxia (HCC)   Anemia   Chronic diastolic CHF (congestive heart failure) (HCC)   IPF (idiopathic pulmonary fibrosis) (HCC)   Sacral decubitus ulcer   Occult blood positive stool    AKI - Likely secondary to pre-renal / hypotension with SBP in the 80s Looks like she usually runs ~110s during last admit Presumably due to Lasix which was started last admit Hold lasix Hold BP meds including ARB and metoprolol IVF: 500cc bolus in ED Strict intake and output Repeat BMP in AM IPF, chronic resp failure with hypoxia - Recent AEIPF, continue steroid taper, 5mg  tomorrow for 3 days Recently stopped Esbriet per her pulmonologist, will defer to pulm wether she should be on this or something else. Cont O2 via [place] Cont home nebs for COPD / Asthma Anemia with occult positive stool - Serial CBCs during stay Suspect a more chronic GIB HGB stable since last admit throughout which it was running 7s-8s Hold home ASA May need transfusion during stay, but will hold off for the moment Consider GI consult vs follow up Cont PPI Thinking AKI may be more responsible for acute issues Sacral decubitus - Wound care consult Not grossly infected HTN - Hold home BP meds due to AKI and soft BPs as noted above DM2 - Hold metformin Sensitive SSI AC  DVT prophylaxis: SCDs due to + hemoccult Code Status: Full Family Communication: No family in room Disposition Plan: SNF after renal recovery Consults called: None Admission status: Place in obs  Kamdin Follett Judie Petit DO [facility]  How to contact the [facility] Attending or Consulting provider 7A - 7P or covering provider during after hours 7P -7A,  for this patient?  Check the care team in [facility] and look for a) attending/consulting [facility] provider listed and b) the [facility] team listed Log into [email or web address]  Amion Physician Scheduling and messaging for groups and whole hospitals  On call and physician scheduling software for group practices, residents, hospitalists and other medical providers for call, clinic, rotation and shift schedules. On[employer] is a hospital-wide system for scheduling doctors and paging doctors on call. EasyPlot is for scientific plotting and data analysis.  [email or web address]  and use [place]'s universal password to access. If you do not have the password, please contact the hospital operator.  Locate the [facility] provider you are looking for under [facility] and page to a number that you can be directly reached. If you still have difficulty reaching the provider, please page the [facility] (Director on Call) for the Hospitalists listed on amion for assistance.  12/27/2020, 11:06 PM

## 2020-12-28 DIAGNOSIS — D509 Iron deficiency anemia, unspecified: Secondary | ICD-10-CM | POA: Diagnosis present

## 2020-12-28 DIAGNOSIS — E119 Type 2 diabetes mellitus without complications: Secondary | ICD-10-CM | POA: Diagnosis not present

## 2020-12-28 DIAGNOSIS — D649 Anemia, unspecified: Secondary | ICD-10-CM | POA: Diagnosis not present

## 2020-12-28 DIAGNOSIS — L308 Other specified dermatitis: Secondary | ICD-10-CM | POA: Diagnosis present

## 2020-12-28 DIAGNOSIS — M109 Gout, unspecified: Secondary | ICD-10-CM | POA: Diagnosis present

## 2020-12-28 DIAGNOSIS — E1122 Type 2 diabetes mellitus with diabetic chronic kidney disease: Secondary | ICD-10-CM | POA: Diagnosis present

## 2020-12-28 DIAGNOSIS — N1832 Chronic kidney disease, stage 3b: Secondary | ICD-10-CM | POA: Diagnosis present

## 2020-12-28 DIAGNOSIS — K219 Gastro-esophageal reflux disease without esophagitis: Secondary | ICD-10-CM | POA: Diagnosis present

## 2020-12-28 DIAGNOSIS — I5032 Chronic diastolic (congestive) heart failure: Secondary | ICD-10-CM | POA: Diagnosis present

## 2020-12-28 DIAGNOSIS — J449 Chronic obstructive pulmonary disease, unspecified: Secondary | ICD-10-CM | POA: Diagnosis present

## 2020-12-28 DIAGNOSIS — E860 Dehydration: Secondary | ICD-10-CM | POA: Diagnosis present

## 2020-12-28 DIAGNOSIS — I130 Hypertensive heart and chronic kidney disease with heart failure and stage 1 through stage 4 chronic kidney disease, or unspecified chronic kidney disease: Secondary | ICD-10-CM | POA: Diagnosis present

## 2020-12-28 DIAGNOSIS — J9621 Acute and chronic respiratory failure with hypoxia: Secondary | ICD-10-CM | POA: Diagnosis present

## 2020-12-28 DIAGNOSIS — I959 Hypotension, unspecified: Secondary | ICD-10-CM | POA: Diagnosis present

## 2020-12-28 DIAGNOSIS — E1142 Type 2 diabetes mellitus with diabetic polyneuropathy: Secondary | ICD-10-CM | POA: Diagnosis present

## 2020-12-28 DIAGNOSIS — J84112 Idiopathic pulmonary fibrosis: Secondary | ICD-10-CM | POA: Diagnosis present

## 2020-12-28 DIAGNOSIS — N281 Cyst of kidney, acquired: Secondary | ICD-10-CM | POA: Diagnosis present

## 2020-12-28 DIAGNOSIS — D631 Anemia in chronic kidney disease: Secondary | ICD-10-CM | POA: Diagnosis present

## 2020-12-28 DIAGNOSIS — T501X5A Adverse effect of loop [high-ceiling] diuretics, initial encounter: Secondary | ICD-10-CM | POA: Diagnosis present

## 2020-12-28 DIAGNOSIS — I2510 Atherosclerotic heart disease of native coronary artery without angina pectoris: Secondary | ICD-10-CM | POA: Diagnosis present

## 2020-12-28 DIAGNOSIS — N179 Acute kidney failure, unspecified: Secondary | ICD-10-CM | POA: Diagnosis present

## 2020-12-28 DIAGNOSIS — J9611 Chronic respiratory failure with hypoxia: Secondary | ICD-10-CM | POA: Diagnosis not present

## 2020-12-28 DIAGNOSIS — L249 Irritant contact dermatitis, unspecified cause: Secondary | ICD-10-CM | POA: Diagnosis present

## 2020-12-28 DIAGNOSIS — E785 Hyperlipidemia, unspecified: Secondary | ICD-10-CM | POA: Diagnosis present

## 2020-12-28 DIAGNOSIS — L89156 Pressure-induced deep tissue damage of sacral region: Secondary | ICD-10-CM | POA: Diagnosis present

## 2020-12-28 DIAGNOSIS — Z20822 Contact with and (suspected) exposure to covid-19: Secondary | ICD-10-CM | POA: Diagnosis present

## 2020-12-28 DIAGNOSIS — K922 Gastrointestinal hemorrhage, unspecified: Secondary | ICD-10-CM | POA: Diagnosis present

## 2020-12-28 LAB — CBC
HCT: 26.9 % — ABNORMAL LOW (ref 36.0–46.0)
Hemoglobin: 7.9 g/dL — ABNORMAL LOW (ref 12.0–15.0)
MCH: 28.5 pg (ref 26.0–34.0)
MCHC: 29.4 g/dL — ABNORMAL LOW (ref 30.0–36.0)
MCV: 97.1 fL (ref 80.0–100.0)
Platelets: 200 10*3/uL (ref 150–400)
RBC: 2.77 MIL/uL — ABNORMAL LOW (ref 3.87–5.11)
RDW: 21.2 % — ABNORMAL HIGH (ref 11.5–15.5)
WBC: 8.7 10*3/uL (ref 4.0–10.5)
nRBC: 0 % (ref 0.0–0.2)

## 2020-12-28 LAB — BASIC METABOLIC PANEL
Anion gap: 8 (ref 5–15)
BUN: 24 mg/dL — ABNORMAL HIGH (ref 8–23)
CO2: 23 mmol/L (ref 22–32)
Calcium: 8.9 mg/dL (ref 8.9–10.3)
Chloride: 104 mmol/L (ref 98–111)
Creatinine, Ser: 1.51 mg/dL — ABNORMAL HIGH (ref 0.44–1.00)
GFR, Estimated: 35 mL/min — ABNORMAL LOW (ref >60)
Glucose, Bld: 106 mg/dL — ABNORMAL HIGH (ref 70–99)
Potassium: 4.7 mmol/L (ref 3.5–5.1)
Sodium: 135 mmol/L (ref 135–145)

## 2020-12-28 LAB — HEMOGLOBIN AND HEMATOCRIT, BLOOD
HCT: 26.5 % — ABNORMAL LOW (ref 36.0–46.0)
HCT: 26 % — ABNORMAL LOW (ref 36.0–46.0)
Hemoglobin: 7.8 g/dL — ABNORMAL LOW (ref 12.0–15.0)
Hemoglobin: 7.8 g/dL — ABNORMAL LOW (ref 12.0–15.0)

## 2020-12-28 LAB — CBG MONITORING, ED
Glucose-Capillary: 134 mg/dL — ABNORMAL HIGH (ref 70–99)
Glucose-Capillary: 86 mg/dL (ref 70–99)
Glucose-Capillary: 138 mg/dL — ABNORMAL HIGH (ref 70–99)

## 2020-12-28 LAB — GLUCOSE, CAPILLARY: Glucose-Capillary: 162 mg/dL — ABNORMAL HIGH (ref 70–99)

## 2020-12-28 LAB — LACTIC ACID, PLASMA: Lactic Acid, Venous: 1.1 mmol/L (ref 0.5–1.9)

## 2020-12-28 LAB — MRSA NEXT GEN BY PCR, NASAL: MRSA by PCR Next Gen: NOT DETECTED

## 2020-12-28 MED ORDER — GERHARDT'S BUTT CREAM
TOPICAL_CREAM | Freq: Three times a day (TID) | CUTANEOUS | Status: DC
  Administered 2020-12-28 – 2020-12-30 (×4): 1 via TOPICAL
  Filled 2020-12-28: qty 1

## 2020-12-28 MED ORDER — SODIUM CHLORIDE 0.9 % IV BOLUS
500.0000 mL | Freq: Once | INTRAVENOUS | Status: AC
  Administered 2020-12-28: 500 mL via INTRAVENOUS

## 2020-12-28 MED ORDER — SODIUM CHLORIDE 0.9 % IV SOLN: INTRAVENOUS | Status: AC

## 2020-12-28 NOTE — ED Notes (Signed)
Per [facility] they can not take this patient due to their BP and pt needs to be progressive. Dr. Toniann Fail notified and bed assignment changed.

## 2020-12-28 NOTE — ED Notes (Signed)
Pt with productive cough, expectorating clear/yellow sputum. Pt was titrated to her home 2LPM per [place], but desaturated to 87% on 2L during coughing fit. Titrated back up to 4LPM per [place] and 95% saturations. Continue to monitor. Call light in reach, denies needs.

## 2020-12-28 NOTE — ED Notes (Signed)
Dr. Kakrakandy notified of pt BP per order set  

## 2020-12-28 NOTE — Progress Notes (Addendum)
PROGRESS NOTE    Sierra Guzman  AST:[id number] DOB: 01/05/1941 DOA: 12/27/2020 PCP: System, Provider Not In    Brief Narrative:  Sierra Guzman is an 80 year old female with past medical history significant for interstitial lung disease/pulmonary fibrosis on 2 L O2 at rest and 5 L O2 with ambulation, CAD, DM2, gout, HLD, GERD, CKD stage IIIb who presented to [facility] ED on 9/19 with generalized weakness.  Patient reports onset roughly 1 week ago, nothing making it better or worse.  Denies fever/chills.  Recently hospitalized 8/20 8-12/2009 for acute exacerbation of pulmonary fibrosis treated with steroid taper.  TTE during admission with grade 1 diastolic dysfunction, and was started on furosemide at discharge.  In the ED, temperature 100.2 F, HR 82, RR 17, BP 88/54, SPO2 100% on 3 L nasal cannula.  WBC 9.0, hemoglobin 8.3, platelets 273.  Sodium 136, potassium 4.5, chloride 103, CO2 23, glucose 136, BUN 29, creatinine 1.91 (was 1.0 on 12/19/20).  BNP 32.1.  Lactic acid 1.5.  Covid-19 PCR negative.  Influenza A/B PCR negative.  Urinalysis unrevealing.  FOBT positive.  Chest x-ray with chronic interstitial lung disease without evidence of acute or active cardiopulmonary disease process.  Patient was given 500 mL IV fluid bolus.  TRH consulted for further evaluation and management of acute on chronic renal failure likely secondary to newly prescribed furosemide.   Assessment & Plan:   Principal Problem:   AKI (acute kidney injury) (HCC) Active Problems:   Diabetes type 2, controlled (HCC)   Hypertension   Chronic respiratory failure with hypoxia (HCC)   Anemia   Chronic diastolic CHF (congestive heart failure) (HCC)   IPF (idiopathic pulmonary fibrosis) (HCC)   Sacral decubitus ulcer   Occult blood positive stool   Acute renal failure on CKD stage IIIb Patient presenting to ED with progressive weakness over the last week.  Recently discharged after acute exacerbation of IPF.  Was also started on  furosemide after echo showed grade 1 diastolic dysfunction.  Creatinine on discharge 12/19/2020 was 1.0.  Creatinine on admission 1.91.  Etiology likely secondary to dehydration from furosemide use. --Cr 1.91>1.51 --Furosemide discontinued --Avoid nephrotoxins, renal dose all medications --Continue IV fluid hydration --Repeat BMP in a.m.  Positive FOBT Iron deficiency anemia On admission, FOBT positive.  Hemoglobin stable; 8.9 on admission (was 7.5 on 9/11 at discharge).  Anemia panel 12/08/2020 with iron 26, TIBC 347, folate 14.0.  Denies NSAID use.  Not on anticoagulants outpatient. --Holding home aspirin --Repeat anemia panel --Continue to monitor hemoglobin every 8 hours --Continue home ferrous gluconate 3 and 24 mg p.o. daily --Hold off on GI consultation unless hemoglobin continues to drop or active bleeding; which is currently not present  IPF/pulmonary fibrosis --Continue prednisone taper from previous discharge, 5 mg p.o. daily x3 days --Incruse Ellipta 1 puff daily --Dulera 2 puffs twice daily --Albuterol neb as needed --Continue supplemental oxygen, maintain SPO2 greater than 88%  Essential hypertension Home regimen includes metoprolol succinate 25 mg p.o. daily, candesartan 32 mg p.o. daily --Holding home metoprolol/candesartan for hypotension --Continue to monitor BP closely  DM2 On metformin 850 mg p.o. twice daily at home.  HLD: Continue Crestor 40 mg p.o. daily  Peripheral neuropathy: Continue gabapentin  GERD: Protonix 80 mg p.o. daily  Hx Gout: Continue allopurinol 300 mg p.o. daily  DVT prophylaxis: Place and maintain sequential compression device Start: 12/27/20 2304   Code Status: Full Code Family Communication: No family present at bedside this morning  Disposition Plan:  Level  of care: Med-Surg Status is: Observation  The patient will require care spanning > 2 midnights and should be moved to inpatient because: Ongoing diagnostic testing needed not  appropriate for outpatient work up, Unsafe d/c plan, IV treatments appropriate due to intensity of illness or inability to take PO, and Inpatient level of care appropriate due to severity of illness  Dispo: The patient is from: SNF [facility]              Anticipated d/c is to: SNF [facility]              Patient currently is not medically stable to d/c.   Difficult to place patient No    Consultants:  None  Procedures:  None  Antimicrobials:  None   Subjective: Patient seen examined at bedside, resting comfortably.  Reports weakness is improved following IV fluid hydration.  No other specific complaints at this time.  Denies headache, no dizziness, no chest pain, no shortness of breath, no abdominal pain, no fever/chills/night sweats, no nausea/vomiting/diarrhea.  Nursing concerned this morning about low blood pressure, ordered IV fluid bolus followed by infusion as it is likely related to dehydration with newly prescribed furosemide.  Objective: Vitals:   12/28/20 1130 12/28/20 1145 12/28/20 1200 12/28/20 1215  BP: (!) 78/51 (!) 91/47 (!) 88/47 (!) 90/51  Pulse: 67 79 70 70  Resp: (!) 25 (!) 28 (!) 22 (!) 21  Temp:      TempSrc:      SpO2: 98% 100% 100% 100%    Intake/Output Summary (Last 24 hours) at 12/28/2020 1250 Last data filed at 12/28/2020 1024 Gross per 24 hour  Intake --  Output 600 ml  Net -600 ml   There were no vitals filed for this visit.  Examination:  General exam: Appears calm and comfortable, elderly in appearance Respiratory system: Coarse breath sounds bilaterally, respiratory effort normal.  No accessory muscle use, on 3 L nasal cannula at rest Cardiovascular system: S1 & S2 heard, RRR. No JVD, murmurs, rubs, gallops or clicks. No pedal edema. Gastrointestinal system: Abdomen is nondistended, soft and nontender. No organomegaly or masses felt. Normal bowel sounds heard. Central nervous system: Alert and oriented. No focal neurological  deficits. Extremities: Symmetric 5 x 5 power. Skin: No rashes, lesions or ulcers Psychiatry: Judgement and insight appear normal. Mood & affect appropriate.     Data Reviewed: I have personally reviewed following labs and imaging studies  CBC: Recent Labs  Lab 12/21/20 2009 12/27/20 1813 12/28/20 0400 12/28/20 1120  WBC 9.4 9.0 8.7  --   NEUTROABS  --  5.7  --   --   HGB 8.9* 8.3* 7.9* 7.8*  HCT 29.3* 28.0* 26.9* 26.5*  MCV 93.9 96.9 97.1  --   PLT 292 273 200  --    Basic Metabolic Panel: Recent Labs  Lab 12/21/20 2009 12/27/20 1813 12/28/20 0400  NA 132* 136 135  K 4.5 4.5 4.7  CL 100 103 104  CO2 25 23 23   GLUCOSE 217* 136* 106*  BUN 25* 29* 24*  CREATININE 1.67* 1.91* 1.51*  CALCIUM 8.9 9.0 8.9   GFR: Estimated Creatinine Clearance: 28.6 mL/min (A) (by C-G formula based on SCr of 1.51 mg/dL (H)). Liver Function Tests: Recent Labs  Lab 12/27/20 1813  AST 24  ALT 8  ALKPHOS 44  BILITOT 0.4  PROT 5.8*  ALBUMIN 2.8*   No results for input(s): LIPASE, AMYLASE in the last 168 hours. No results for input(s):  AMMONIA in the last 168 hours. Coagulation Profile: No results for input(s): INR, PROTIME in the last 168 hours. Cardiac Enzymes: No results for input(s): CKTOTAL, CKMB, CKMBINDEX, TROPONINI in the last 168 hours. BNP (last 3 results) No results for input(s): PROBNP in the last 8760 hours. HbA1C: No results for input(s): HGBA1C in the last 72 hours. CBG: Recent Labs  Lab 12/28/20 0803 12/28/20 1228  GLUCAP 134* 86   Lipid Profile: No results for input(s): CHOL, HDL, LDLCALC, TRIG, CHOLHDL, LDLDIRECT in the last 72 hours. Thyroid Function Tests: No results for input(s): TSH, T4TOTAL, FREET4, T3FREE, THYROIDAB in the last 72 hours. Anemia Panel: No results for input(s): VITAMINB12, FOLATE, FERRITIN, TIBC, IRON, RETICCTPCT in the last 72 hours. Sepsis Labs: Recent Labs  Lab 12/27/20 1813 12/28/20 0215  LATICACIDVEN 1.5 1.1    Recent  Results (from the past 240 hour(s))  Culture, blood (routine x 2)     Status: None (Preliminary result)   Collection Time: 12/27/20  6:13 PM   Specimen: BLOOD  Result Value Ref Range Status   Specimen Description BLOOD RIGHT ANTECUBITAL  Final   Special Requests   Final    BOTTLES DRAWN AEROBIC AND ANAEROBIC Blood Culture adequate volume   Culture   Final    NO GROWTH < 24 HOURS Performed at [facility], [address], [place], [place] [address]    Report Status PENDING  Incomplete  Resp Panel by RT-PCR (Flu A&B, Covid) Nasopharyngeal Swab     Status: None   Collection Time: 12/27/20  7:00 PM   Specimen: Nasopharyngeal Swab; Nasopharyngeal(NP) swabs in vial transport medium  Result Value Ref Range Status   SARS Coronavirus 2 by RT PCR NEGATIVE NEGATIVE Final    Comment: (NOTE) SARS-CoV-2 target nucleic acids are NOT DETECTED.  The SARS-CoV-2 RNA is generally detectable in upper respiratory specimens during the acute phase of infection. The lowest concentration of SARS-CoV-2 viral copies this assay can detect is 138 copies/mL. A negative result does not preclude SARS-Cov-2 infection and should not be used as the sole basis for treatment or other patient management decisions. A negative result may occur with  improper specimen collection/handling, submission of specimen other than nasopharyngeal swab, presence of viral mutation(s) within the areas targeted by this assay, and inadequate number of viral copies(<138 copies/mL). A negative result must be combined with clinical observations, patient history, and epidemiological information. The expected result is Negative.  Fact Sheet for Patients:  BloggerCourse.com  Fact Sheet for Healthcare Providers:  SeriousBroker.it  This test is no t yet approved or cleared by the [place] FDA and  has been authorized for detection and/or diagnosis of SARS-CoV-2 by FDA under an  Emergency Use Authorization (EUA). This EUA will remain  in effect (meaning this test can be used) for the duration of the COVID-19 declaration under Section 564(b)(1) of the Act, 21 U.S.C.section 360bbb-3(b)(1), unless the authorization is terminated  or revoked sooner.       Influenza A by PCR NEGATIVE NEGATIVE Final   Influenza B by PCR NEGATIVE NEGATIVE Final    Comment: (NOTE) The Xpert Xpress SARS-CoV-2/FLU/RSV plus assay is intended as an aid in the diagnosis of influenza from Nasopharyngeal swab specimens and should not be used as a sole basis for treatment. Nasal washings and aspirates are unacceptable for Xpert Xpress SARS-CoV-2/FLU/RSV testing.  Fact Sheet for Patients: BloggerCourse.com  Fact Sheet for Healthcare Providers: SeriousBroker.it  This test is not yet approved or cleared by the [place]  [place] FDA and has been authorized for detection and/or diagnosis of SARS-CoV-2 by FDA under an Emergency Use Authorization (EUA). This EUA will remain in effect (meaning this test can be used) for the duration of the COVID-19 declaration under Section 564(b)(1) of the Act, 21 U.S.C. section 360bbb-3(b)(1), unless the authorization is terminated or revoked.  Performed at [facility], [address], [place], [place] [address]          Radiology Studies: DG Chest Portable 1 View  Result Date: 12/27/2020 CLINICAL DATA:  Generalized weakness and tremors. EXAM: PORTABLE CHEST 1 VIEW COMPARISON:  December 21, 2020 FINDINGS: Stable, chronic interstitial lung disease is seen throughout both lungs. There is no evidence of focal consolidation, pleural effusion or pneumothorax. The heart size and mediastinal contours are within normal limits. The visualized skeletal structures are unremarkable. IMPRESSION: Chronic interstitial lung disease without evidence of acute or active cardiopulmonary disease. Electronically Signed    By: Aram Candela M.D.   On: 12/27/2020 18:24        Scheduled Meds:  allopurinol  300 mg Oral Daily   docusate sodium  100 mg Oral BID   ferrous gluconate  324 mg Oral Q breakfast   gabapentin  600 mg Oral BID   gabapentin  900 mg Oral QHS   Gerhardt's butt cream   Topical TID   insulin aspart  0-9 Units Subcutaneous TID WC   mometasone-formoterol  2 puff Inhalation BID   pantoprazole  80 mg Oral Daily   polyethylene glycol  17 g Oral Daily   predniSONE  5 mg Oral Q breakfast   rosuvastatin  40 mg Oral Daily   umeclidinium bromide  1 puff Inhalation Daily   Continuous Infusions:  sodium chloride 75 mL/hr at 12/28/20 1127     LOS: 0 days    Time spent: 39 minutes spent on chart review, discussion with nursing staff, consultants, updating family and interview/physical exam; more than 50% of that time was spent in counseling and/or coordination of care.    Alvira Philips [place], DO [facility] Available via Epic secure chat 7am-7pm After these hours, please refer to coverage provider listed on [email or web address] 12/28/2020, 12:50 PM

## 2020-12-28 NOTE — Consult Note (Signed)
WOC Nurse Consult Note: Reason for Consult: partial thickness skin loss to buttocks secondary to irritant contact dermatitis secondary to urinary and fecal incontinence Wound type: moisture  ICD-10 CM Codes for Irritant Dermatitis  L24A2 - Due to fecal, urinary or dual incontinence  Pressure Injury POA: N/A Measurement: two areas of moisture associated dermatitis, one at left buttock measuring 4cm x 4cm and one over the coccyx measuring 3cm x 2cm Wound bed: small area of partial thickness skin loss at the left buttock Drainage (amount, consistency, odor) scant serous Periwound: intact, macerated Dressing procedure/placement/frequency: Turning and repositioning will be the cornerstone of the POC, also the topical application of a moisture barrier (Gerhart's Butt Cream). No disposable underpads or briefs are to be used, instead patient should only be placed on DermaTherapy under pads. Heels are to be floated for PI prevention.  WOC nursing team will not follow, but will remain available to this patient, the nursing and medical teams.  Please re-consult if needed. Thanks, Ladona Mow, MSN, RN, GNP, [place]  Pager# [phone]

## 2020-12-29 DIAGNOSIS — N179 Acute kidney failure, unspecified: Secondary | ICD-10-CM | POA: Diagnosis not present

## 2020-12-29 DIAGNOSIS — E119 Type 2 diabetes mellitus without complications: Secondary | ICD-10-CM | POA: Diagnosis not present

## 2020-12-29 DIAGNOSIS — J9611 Chronic respiratory failure with hypoxia: Secondary | ICD-10-CM | POA: Diagnosis not present

## 2020-12-29 DIAGNOSIS — J84112 Idiopathic pulmonary fibrosis: Secondary | ICD-10-CM | POA: Diagnosis not present

## 2020-12-29 LAB — IRON AND TIBC
Iron: 20 ug/dL — ABNORMAL LOW (ref 28–170)
Saturation Ratios: 8 % — ABNORMAL LOW (ref 10.4–31.8)
TIBC: 249 ug/dL — ABNORMAL LOW (ref 250–450)
UIBC: 229 ug/dL

## 2020-12-29 LAB — FOLATE: Folate: 16.3 ng/mL (ref >5.9)

## 2020-12-29 LAB — BASIC METABOLIC PANEL
Anion gap: 5 (ref 5–15)
BUN: 16 mg/dL (ref 8–23)
CO2: 25 mmol/L (ref 22–32)
Calcium: 8.6 mg/dL — ABNORMAL LOW (ref 8.9–10.3)
Chloride: 108 mmol/L (ref 98–111)
Creatinine, Ser: 1.26 mg/dL — ABNORMAL HIGH (ref 0.44–1.00)
GFR, Estimated: 43 mL/min — ABNORMAL LOW (ref >60)
Glucose, Bld: 111 mg/dL — ABNORMAL HIGH (ref 70–99)
Potassium: 4.3 mmol/L (ref 3.5–5.1)
Sodium: 138 mmol/L (ref 135–145)

## 2020-12-29 LAB — CBC
HCT: 25 % — ABNORMAL LOW (ref 36.0–46.0)
Hemoglobin: 7.6 g/dL — ABNORMAL LOW (ref 12.0–15.0)
MCH: 28.8 pg (ref 26.0–34.0)
MCHC: 30.4 g/dL (ref 30.0–36.0)
MCV: 94.7 fL (ref 80.0–100.0)
Platelets: 213 10*3/uL (ref 150–400)
RBC: 2.64 MIL/uL — ABNORMAL LOW (ref 3.87–5.11)
RDW: 20.5 % — ABNORMAL HIGH (ref 11.5–15.5)
WBC: 8.8 10*3/uL (ref 4.0–10.5)
nRBC: 0 % (ref 0.0–0.2)

## 2020-12-29 LAB — RETICULOCYTES
Immature Retic Fract: 21 % — ABNORMAL HIGH (ref 2.3–15.9)
RBC.: 2.65 MIL/uL — ABNORMAL LOW (ref 3.87–5.11)
Retic Count, Absolute: 67.3 10*3/uL (ref 19.0–186.0)
Retic Ct Pct: 2.5 % (ref 0.4–3.1)

## 2020-12-29 LAB — MAGNESIUM: Magnesium: 1.8 mg/dL (ref 1.7–2.4)

## 2020-12-29 LAB — FERRITIN: Ferritin: 191 ng/mL (ref 11–307)

## 2020-12-29 LAB — GLUCOSE, CAPILLARY
Glucose-Capillary: 112 mg/dL — ABNORMAL HIGH (ref 70–99)
Glucose-Capillary: 182 mg/dL — ABNORMAL HIGH (ref 70–99)
Glucose-Capillary: 176 mg/dL — ABNORMAL HIGH (ref 70–99)
Glucose-Capillary: 227 mg/dL — ABNORMAL HIGH (ref 70–99)

## 2020-12-29 LAB — VITAMIN B12: Vitamin B-12: 1247 pg/mL — ABNORMAL HIGH (ref 180–914)

## 2020-12-29 MED ORDER — SODIUM CHLORIDE 0.9 % IV SOLN
250.0000 mg | Freq: Every day | INTRAVENOUS | Status: AC
  Administered 2020-12-29 – 2020-12-30 (×2): 250 mg via INTRAVENOUS
  Filled 2020-12-29 (×2): qty 20

## 2020-12-29 NOTE — Evaluation (Signed)
Physical Therapy Evaluation Patient Details Name: Sierra Guzman MRN: [id number] DOB: Jan 19, 1941 Today's Date: 12/29/2020  History of Present Illness  80 y.o. female presents to [facility] ED on 12/27/2020 with generalized weakness. Pt admitted for acute renal failure. Pt recently admitted 8/28-9/02/2021 with acute respiratory failure. PMH includes pulmonary fibrosis on 2 L O2 at rest and 5 L O2 with ambulation, CAD, DM2, gout, HLD, GERD, CKD stage IIIb.  Clinical Impression  Pt presents to PT with deficits in activity tolerance, cardiopulmonary function, power, gait, balance. Pt is primarily limited by SOB and impaired endurance during session, utilizing increased levels of supplemental oxygen with activity and requiring increased recovery times compared to baseline. Pt will benefit from continued acute PT services to improve activity tolerance and aide in a return to independence in household mobility. PT recommends return to SNF due to limited caregiver support.       Recommendations for follow up therapy are one component of a multi-disciplinary discharge planning process, led by the attending physician.  Recommendations may be updated based on patient status, additional functional criteria and insurance authorization.  Follow Up Recommendations SNF    Equipment Recommendations  None recommended by PT    Recommendations for Other Services       Precautions / Restrictions Precautions Precautions: Fall Precaution Comments: monitor SpO2 Restrictions Weight Bearing Restrictions: No      Mobility  Bed Mobility Overal bed mobility: Independent                  Transfers Overall transfer level: Needs assistance Equipment used: Rolling walker (2 wheeled) Transfers: Sit to/from Stand Sit to Stand: Supervision            Ambulation/Gait Ambulation/Gait assistance: Supervision Gait Distance (Feet): 80 Feet Assistive device: Rolling walker (2 wheeled) Gait Pattern/deviations:  Step-through pattern Gait velocity: reduced Gait velocity interpretation: 1.31 - 2.62 ft/sec, indicative of limited community ambulator General Gait Details: pt with slowed step-through gait  Stairs            Wheelchair Mobility    Modified Rankin (Stroke Patients Only)       Balance Overall balance assessment: Needs assistance Sitting-balance support: No upper extremity supported;Feet supported Sitting balance-Leahy Scale: Good     Standing balance support: Single extremity supported;Bilateral upper extremity supported Standing balance-Leahy Scale: Poor Standing balance comment: benefits from UE support of RW                             Pertinent Vitals/Pain Pain Assessment: No/denies pain    Home Living Family/patient expects to be discharged to:: Private residence Living Arrangements: Alone Available Help at Discharge: Family;Available PRN/intermittently (daughter works) Type of Home: House Home Access: Ramped entrance     Home Layout: Multi-level Home Equipment:  (4 wheeled walker with seat)      Prior Function Level of Independence: Needs assistance   Gait / Transfers Assistance Needed: pt has been ambulating with rollator for a few months. ambulating in hallway at rehab, was able to tolerate multiple consecutive bouts of ambulation and ADLs when at SNF prior to this admission           Hand Dominance   Dominant Hand: Right    Extremity/Trunk Assessment   Upper Extremity Assessment Upper Extremity Assessment: Overall WFL for tasks assessed    Lower Extremity Assessment Lower Extremity Assessment: Generalized weakness    Cervical / Trunk Assessment Cervical / Trunk Assessment: Kyphotic  Communication   Communication: No difficulties  Cognition Arousal/Alertness: Awake/alert Behavior During Therapy: WFL for tasks assessed/performed Overall Cognitive Status: Within Functional Limits for tasks assessed                                         General Comments General comments (skin integrity, edema, etc.): pt on 4L [place] upon arrival, pt desats to mid 80s when sitting at edge of bed on 4L, PT increases supplemental oxygen to 6L [place] with sats dropping to 71% by the end of amulation. Pt takes 2 minutes to recover to 88% on 6L when seated at edge of bed (longer recovery than typical per patient)    Exercises     Assessment/Plan    PT Assessment Patient needs continued PT services  PT Problem List Decreased activity tolerance;Decreased balance;Decreased mobility;Cardiopulmonary status limiting activity       PT Treatment Interventions DME instruction;Gait training;Functional mobility training;Therapeutic exercise;Therapeutic activities;Balance training;Neuromuscular re-education;Patient/family education    PT Goals (Current goals can be found in the Care Plan section)  Acute Rehab PT Goals Patient Stated Goal: to return to rehab and improve activity tolerance in order to return to independence PT Goal Formulation: With patient Time For Goal Achievement: 01/12/21 Potential to Achieve Goals: Good Additional Goals Additional Goal #1: Pt will report 4/10 DOE or less when ambulating >100' to indicate improved activity tolerance    Frequency Min 2X/week   Barriers to discharge Decreased caregiver support      Co-evaluation               AM-PAC PT "6 Clicks" Mobility  Outcome Measure Help needed turning from your back to your side while in a flat bed without using bedrails?: None Help needed moving from lying on your back to sitting on the side of a flat bed without using bedrails?: None Help needed moving to and from a bed to a chair (including a wheelchair)?: A Little Help needed standing up from a chair using your arms (e.g., wheelchair or bedside chair)?: A Little Help needed to walk in hospital room?: A Little Help needed climbing 3-5 steps with a railing? : A Lot 6 Click Score: 19     End of Session Equipment Utilized During Treatment: Oxygen Activity Tolerance: Patient limited by fatigue Patient left: in bed;with call bell/phone within reach;with bed alarm set;with family/visitor present Nurse Communication: Mobility status PT Visit Diagnosis: Other abnormalities of gait and mobility (R26.89);Muscle weakness (generalized) (M62.81)    Time: 8891-6945 PT Time Calculation (min) (ACUTE ONLY): 34 min   Charges:   PT Evaluation $PT Eval Low Complexity: 1 Low          Arlyss Gandy, PT, DPT Acute Rehabilitation Pager: [phone]   Arlyss Gandy 12/29/2020, 5:02 PM

## 2020-12-29 NOTE — Progress Notes (Signed)
PROGRESS NOTE    Sierra Guzman  WJX:[id number] DOB: September 20, 1940 DOA: 12/27/2020 PCP: System, Provider Not In    Brief Narrative:  [name] was admitted to the hospital with the working diagnosis of AKI on CKD stage 36b.   80 year old female past medical history for interstitial lung disease, chronic hypoxic respiratory failure, coronary artery disease, type 2 diabetes mellitus, gout, dyslipidemia, GERD and chronic kidney disease stage IIIb who presented with generalized weakness. She had recent hospitalization for pulmonary fibrosis 12/04/2020-12/19/2020.   Patient reported 7 days of persistent symptoms, with no improving worsening factors.  On her physical examination her temperature was 100.2 F, heart rate 82, respiratory rate 17, blood pressure 88/54, oxygen saturation 100% on 3 L per nasal cannula. Her lungs had rales bilaterally, heart S1-S2, present, rhythmic, soft abdomen, no lower extremity edema.  Positive sacral decubitus ulcer.  Sodium 136, potassium 4.5, chloride 103, bicarb 23, glucose 136, BUN 29, creatinine 1.91, white count 9.0, hemoglobin 8.3, hematocrit 28.0, platelets 273. SARS COVID-19 negative.  Urinalysis specific gravity 1.010, 0-5 red cells, 11-20 white cells.  Chest radiograph with bilateral interstitial infiltrates.  Assessment & Plan:   Principal Problem:   AKI (acute kidney injury) (HCC) Active Problems:   Diabetes type 2, controlled (HCC)   Hypertension   Chronic respiratory failure with hypoxia (HCC)   Anemia   Chronic diastolic CHF (congestive heart failure) (HCC)   IPF (idiopathic pulmonary fibrosis) (HCC)   Sacral decubitus ulcer   Occult blood positive stool   ARF (acute renal failure) (HCC)   AKI on CKD stage 3b. Renal function is improving but not yet back to baseline. Serum cr is 1.26 with K at 4,3 and serum bicarbonate at 25. Continue supportive medical therapy. Patient is tolerating po well, will continue to hold on IV fluids and avoid  nephrotoxic medications.  2. Iron deficiency anemia/ anemia of chronic disease. No melena or hematochezia. Iron panl with anemia of chronic disease combined with iron deficiency. Serum iron 20,m TIBC 249, transferrin saturation 8 and ferritin 191.  Add IV iron and follow iron panel as outpatient.   3. HTN Continue blood pressure monitoring, off antihypertensive medications. Her blood pressure has been low.   4. T2DM/ dyslipidemiam continue glucose control with meformin.   5.  Peripheral neuropathy/ gout. Continue with gabapentin.   6. ILD/ pulmonary fibrosis. Continue supplemental 02 per [place], continue bronchodilator therapy and inhaled corticosteroids.  Continue steroid taper.   Patient continue to be at high risk for worsening renal failure   Status is: Inpatient  Remains inpatient appropriate because:Inpatient level of care appropriate due to severity of illness  Dispo: The patient is from: SNF              Anticipated d/c is to: SNF              Patient currently is not medically stable to d/c.   Difficult to place patient No  DVT prophylaxis: Enoxaparin   Code Status:   full  Family Communication:  I spoke with patient's daughter at the bedside, we talked in detail about patient's condition, plan of care and prognosis and all questions were addressed.     Subjective: Patient is feeling better, no nausea or vomiting, no chest pain. Dyspnea continue to be present, worse on exertion, continue to be very weak and deconditioned, not at her baseline.   Objective: Vitals:   12/29/20 0709 12/29/20 0735 12/29/20 0800 12/29/20 1210  BP: (!) 93/57 (!) 93/57 Marland Kitchen)  108/54 110/73  Pulse: 73 83 92 (!) 102  Resp: 20 19 (!) 21 19  Temp: (!) 97.5 F (36.4 C)   97.9 F (36.6 C)  TempSrc: Oral   Oral  SpO2: 100% 94% 99% 93%  Weight:      Height:        Intake/Output Summary (Last 24 hours) at 12/29/2020 1552 Last data filed at 12/29/2020 1200 Gross per 24 hour  Intake 1277.72 ml   Output 800 ml  Net 477.72 ml   Filed Weights   12/28/20 2148  Weight: 73.2 kg    Examination:   General: Not in pain, positive dyspnea on exertion  Neurology: Awake and alert, non focal  E ENT: no pallor, no icterus, oral mucosa moist Cardiovascular: No JVD. S1-S2 present, rhythmic, no gallops, rubs, or murmurs. No lower extremity edema. Pulmonary: positive breath sounds bilaterally, adequate air movement, no wheezing, or rhonchi, positive bilateral rales at bases. Gastrointestinal. Abdomen soft and non tender Skin. No rashes Musculoskeletal: no joint deformities     Data Reviewed: I have personally reviewed following labs and imaging studies  CBC: Recent Labs  Lab 12/27/20 1813 12/28/20 0400 12/28/20 1120 12/28/20 2000 12/29/20 0021  WBC 9.0 8.7  --   --  8.8  NEUTROABS 5.7  --   --   --   --   HGB 8.3* 7.9* 7.8* 7.8* 7.6*  HCT 28.0* 26.9* 26.5* 26.0* 25.0*  MCV 96.9 97.1  --   --  94.7  PLT 273 200  --   --  213   Basic Metabolic Panel: Recent Labs  Lab 12/27/20 1813 12/28/20 0400 12/29/20 0021  NA 136 135 138  K 4.5 4.7 4.3  CL 103 104 108  CO2 23 23 25   GLUCOSE 136* 106* 111*  BUN 29* 24* 16  CREATININE 1.91* 1.51* 1.26*  CALCIUM 9.0 8.9 8.6*  MG  --   --  1.8   GFR: Estimated Creatinine Clearance: 34.1 mL/min (A) (by C-G formula based on SCr of 1.26 mg/dL (H)). Liver Function Tests: Recent Labs  Lab 12/27/20 1813  AST 24  ALT 8  ALKPHOS 44  BILITOT 0.4  PROT 5.8*  ALBUMIN 2.8*   No results for input(s): LIPASE, AMYLASE in the last 168 hours. No results for input(s): AMMONIA in the last 168 hours. Coagulation Profile: No results for input(s): INR, PROTIME in the last 168 hours. Cardiac Enzymes: No results for input(s): CKTOTAL, CKMB, CKMBINDEX, TROPONINI in the last 168 hours. BNP (last 3 results) No results for input(s): PROBNP in the last 8760 hours. HbA1C: No results for input(s): HGBA1C in the last 72 hours. CBG: Recent Labs   Lab 12/28/20 1228 12/28/20 1643 12/28/20 2202 12/29/20 0605 12/29/20 1054  GLUCAP 86 138* 162* 112* 182*   Lipid Profile: No results for input(s): CHOL, HDL, LDLCALC, TRIG, CHOLHDL, LDLDIRECT in the last 72 hours. Thyroid Function Tests: No results for input(s): TSH, T4TOTAL, FREET4, T3FREE, THYROIDAB in the last 72 hours. Anemia Panel: Recent Labs    12/29/20 0021  VITAMINB12 1,247*  FOLATE 16.3  FERRITIN 191  TIBC 249*  IRON 20*  RETICCTPCT 2.5      Radiology Studies: I have reviewed all of the imaging during this hospital visit personally     Scheduled Meds:  allopurinol  300 mg Oral Daily   docusate sodium  100 mg Oral BID   ferrous gluconate  324 mg Oral Q breakfast   gabapentin  600 mg Oral BID  gabapentin  900 mg Oral QHS   Gerhardt's butt cream   Topical TID   insulin aspart  0-9 Units Subcutaneous TID WC   mometasone-formoterol  2 puff Inhalation BID   pantoprazole  80 mg Oral Daily   polyethylene glycol  17 g Oral Daily   predniSONE  5 mg Oral Q breakfast   rosuvastatin  40 mg Oral Daily   umeclidinium bromide  1 puff Inhalation Daily   Continuous Infusions:   LOS: 1 day        Zamiyah Resendes Annett Gula, MD

## 2020-12-30 ENCOUNTER — Inpatient Hospital Stay (HOSPITAL_COMMUNITY): Payer: Medicare HMO

## 2020-12-30 DIAGNOSIS — N179 Acute kidney failure, unspecified: Secondary | ICD-10-CM | POA: Diagnosis not present

## 2020-12-30 DIAGNOSIS — I5032 Chronic diastolic (congestive) heart failure: Secondary | ICD-10-CM | POA: Diagnosis not present

## 2020-12-30 DIAGNOSIS — J9611 Chronic respiratory failure with hypoxia: Secondary | ICD-10-CM | POA: Diagnosis not present

## 2020-12-30 DIAGNOSIS — D649 Anemia, unspecified: Secondary | ICD-10-CM | POA: Diagnosis not present

## 2020-12-30 LAB — BASIC METABOLIC PANEL
Anion gap: 5 (ref 5–15)
BUN: 17 mg/dL (ref 8–23)
CO2: 26 mmol/L (ref 22–32)
Calcium: 8.6 mg/dL — ABNORMAL LOW (ref 8.9–10.3)
Chloride: 107 mmol/L (ref 98–111)
Creatinine, Ser: 1.46 mg/dL — ABNORMAL HIGH (ref 0.44–1.00)
GFR, Estimated: 36 mL/min — ABNORMAL LOW (ref >60)
Glucose, Bld: 145 mg/dL — ABNORMAL HIGH (ref 70–99)
Potassium: 4.3 mmol/L (ref 3.5–5.1)
Sodium: 138 mmol/L (ref 135–145)

## 2020-12-30 LAB — GLUCOSE, CAPILLARY
Glucose-Capillary: 227 mg/dL — ABNORMAL HIGH (ref 70–99)
Glucose-Capillary: 143 mg/dL — ABNORMAL HIGH (ref 70–99)
Glucose-Capillary: 202 mg/dL — ABNORMAL HIGH (ref 70–99)
Glucose-Capillary: 163 mg/dL — ABNORMAL HIGH (ref 70–99)

## 2020-12-30 LAB — LIPID PANEL
Cholesterol: 98 mg/dL (ref 0–200)
HDL: 44 mg/dL (ref >40)
LDL Cholesterol: 43 mg/dL (ref 0–99)
Total CHOL/HDL Ratio: 2.2 RATIO
Triglycerides: 53 mg/dL (ref <150)
VLDL: 11 mg/dL (ref 0–40)

## 2020-12-30 MED ORDER — ENOXAPARIN SODIUM 40 MG/0.4ML IJ SOSY
40.0000 mg | PREFILLED_SYRINGE | INTRAMUSCULAR | Status: DC
  Administered 2020-12-30 – 2021-01-02 (×4): 40 mg via SUBCUTANEOUS
  Filled 2020-12-30 (×4): qty 0.4

## 2020-12-30 NOTE — Progress Notes (Signed)
PROGRESS NOTE    Sierra Guzman  QHU:[id number] DOB: 1940-09-11 DOA: 12/27/2020 PCP: System, Provider Not In    Brief Narrative:  Mr. Isabella was admitted to the hospital with the working diagnosis of AKI on CKD stage 33b.    80 year old female past medical history for interstitial lung disease, chronic hypoxic respiratory failure, coronary artery disease, type 2 diabetes mellitus, gout, dyslipidemia, GERD and chronic kidney disease stage IIIb who presented with generalized weakness. She had recent hospitalization for pulmonary fibrosis exacerbation 12/04/2020-12/19/2020, treated with high dose corticosteroids.  Patient reported 7 days of persistent symptoms, with no improving worsening factors.  On her physical examination her temperature was 100.2 F, heart rate 82, respiratory rate 17, blood pressure 88/54, oxygen saturation 100% on 3 L per nasal cannula. Her lungs had rales bilaterally, heart S1-S2, present, rhythmic, soft abdomen, no lower extremity edema.  Positive sacral decubitus ulcer.   Sodium 136, potassium 4.5, chloride 103, bicarb 23, glucose 136, BUN 29, creatinine 1.91, white count 9.0, hemoglobin 8.3, hematocrit 28.0, platelets 273. SARS COVID-19 negative.   Urinalysis specific gravity 1.010, 0-5 red cells, 11-20 white cells.   Chest radiograph with bilateral interstitial infiltrates.  Patient placed on IV fluids with improvement of renal function. Continue to be very weak and deconditioned.    Assessment & Plan:   Principal Problem:   AKI (acute kidney injury) (HCC) Active Problems:   Diabetes type 2, controlled (HCC)   Hypertension   Chronic respiratory failure with hypoxia (HCC)   Anemia   Chronic diastolic CHF (congestive heart failure) (HCC)   IPF (idiopathic pulmonary fibrosis) (HCC)   Sacral decubitus ulcer   Occult blood positive stool   ARF (acute renal failure) (HCC)   AKI on CKD stage 3b.  Renal function with serum cr at 1.46 with K at 4,3 and serum  bicarbonate at 26.   Base cr is around 1.2 and 1,3.  Patient tolerating po well, no signs of volume overload.  Plan to discontinue IV fluids and follow up renal function in am.   Further work up with renal US.  Ok to discontinue telemetry monitoring.    2. Iron deficiency anemia/ anemia of chronic disease.  Serum iron 20,m TIBC 249, transferrin saturation 8 and ferritin 191.   Continue with IV iron, will need to follow up iron panel as outpatient. Check H&H in am.    3. HTN. Blood pressure this am 106/63 mmHg, continue to hold on antihypertensive medications including diuretics.    4. T2DM/ dyslipidemia.  Fasting glucose this am 145 mg/dl, continue with metformin. Continue with statin therapy.    5.  Peripheral neuropathy/ gout. on gabapentin.    6. ILD/ pulmonary fibrosis.  Stable oxymetry with 02 saturation 96% on 4 L/min per [place]. Add humidification to supplemental 02  Continue PT and OT.    Status is: Inpatient  Remains inpatient appropriate because:Inpatient level of care appropriate due to severity of illness  Dispo: The patient is from: SNF              Anticipated d/c is to: SNF              Patient currently is medically stable to d/c.   Difficult to place patient No   DVT prophylaxis: Enoxaparin   Code Status:    full  Family Communication:  No family at the bedside     Subjective: Patient with no nausea or vomiting, dyspnea at baseline, no chest pain. Out of bed to  chair.   Objective: Vitals:   12/30/20 0300 12/30/20 0442 12/30/20 0717 12/30/20 0723  BP:  120/85 120/65 106/63  Pulse: 75 71 93 92  Resp: 19 20 17  (!) 23  Temp:  98.7 F (37.1 C)  98.8 F (37.1 C)  TempSrc:  Oral  Oral  SpO2: 90% 97% 96% 96%  Weight:      Height:        Intake/Output Summary (Last 24 hours) at 12/30/2020 0847 Last data filed at 12/30/2020 0700 Gross per 24 hour  Intake 1110.44 ml  Output 1800 ml  Net -689.56 ml   Filed Weights   12/28/20 2148  Weight: 73.2 kg     Examination:   General: deconditioned, positive dyspnea with efforts.  Neurology: Awake and alert, non focal. Upper and lower extremities strength is preserved.  E ENT: no pallor, no icterus, oral mucosa moist Cardiovascular: No JVD. S1-S2 present, rhythmic, no gallops, rubs, or murmurs. No lower extremity edema. Pulmonary:positive breath sounds bilaterally, adequate air movement, no wheezing, or rhonchi scattered bilateral rales. Gastrointestinal. Abdomen soft and non tender Skin. No rashes Musculoskeletal: no joint deformities     Data Reviewed: I have personally reviewed following labs and imaging studies  CBC: Recent Labs  Lab 12/27/20 1813 12/28/20 0400 12/28/20 1120 12/28/20 2000 12/29/20 0021  WBC 9.0 8.7  --   --  8.8  NEUTROABS 5.7  --   --   --   --   HGB 8.3* 7.9* 7.8* 7.8* 7.6*  HCT 28.0* 26.9* 26.5* 26.0* 25.0*  MCV 96.9 97.1  --   --  94.7  PLT 273 200  --   --  213   Basic Metabolic Panel: Recent Labs  Lab 12/27/20 1813 12/28/20 0400 12/29/20 0021 12/30/20 0023  NA 136 135 138 138  K 4.5 4.7 4.3 4.3  CL 103 104 108 107  CO2 23 23 25 26   GLUCOSE 136* 106* 111* 145*  BUN 29* 24* 16 17  CREATININE 1.91* 1.51* 1.26* 1.46*  CALCIUM 9.0 8.9 8.6* 8.6*  MG  --   --  1.8  --    GFR: Estimated Creatinine Clearance: 29.4 mL/min (A) (by C-G formula based on SCr of 1.46 mg/dL (H)). Liver Function Tests: Recent Labs  Lab 12/27/20 1813  AST 24  ALT 8  ALKPHOS 44  BILITOT 0.4  PROT 5.8*  ALBUMIN 2.8*   No results for input(s): LIPASE, AMYLASE in the last 168 hours. No results for input(s): AMMONIA in the last 168 hours. Coagulation Profile: No results for input(s): INR, PROTIME in the last 168 hours. Cardiac Enzymes: No results for input(s): CKTOTAL, CKMB, CKMBINDEX, TROPONINI in the last 168 hours. BNP (last 3 results) No results for input(s): PROBNP in the last 8760 hours. HbA1C: No results for input(s): HGBA1C in the last 72  hours. CBG: Recent Labs  Lab 12/29/20 0605 12/29/20 1054 12/29/20 1822 12/29/20 2059 12/30/20 0627  GLUCAP 112* 182* 176* 227* 227*   Lipid Profile: Recent Labs    12/30/20 0023  CHOL 98  HDL 44  LDLCALC 43  TRIG 53  CHOLHDL 2.2   Thyroid Function Tests: No results for input(s): TSH, T4TOTAL, FREET4, T3FREE, THYROIDAB in the last 72 hours. Anemia Panel: Recent Labs    12/29/20 0021  VITAMINB12 1,247*  FOLATE 16.3  FERRITIN 191  TIBC 249*  IRON 20*  RETICCTPCT 2.5      Radiology Studies: I have reviewed all of the imaging during this hospital visit personally  Scheduled Meds:  allopurinol  300 mg Oral Daily   docusate sodium  100 mg Oral BID   enoxaparin (LOVENOX) injection  40 mg Subcutaneous Q24H   gabapentin  600 mg Oral BID   gabapentin  900 mg Oral QHS   Gerhardt's butt cream   Topical TID   insulin aspart  0-9 Units Subcutaneous TID WC   mometasone-formoterol  2 puff Inhalation BID   pantoprazole  80 mg Oral Daily   polyethylene glycol  17 g Oral Daily   rosuvastatin  40 mg Oral Daily   umeclidinium bromide  1 puff Inhalation Daily   Continuous Infusions:  ferric gluconate (FERRLECIT) IVPB 250 mg (12/29/20 1900)     LOS: 2 days        Alontae Chaloux Annett Gula, MD

## 2020-12-30 NOTE — Social Work (Signed)
Notified Crystal with [facility] of anticipated [employer]. Crystal to start [employer] today. Will need updated COVID test prior to dc. SW assist as indicated.   Dellie Burns, MSW, LCSW [phone] (coverage)

## 2020-12-30 NOTE — Plan of Care (Signed)

## 2020-12-30 NOTE — Evaluation (Addendum)
Occupational Therapy Evaluation Patient Details Name: Sierra Guzman MRN: [id number] DOB: 10/12/1940 Today's Date: 12/30/2020   History of Present Illness 80 y.o. female presents to [facility] ED on 12/27/2020 with generalized weakness. Pt admitted for acute renal failure. Pt recently admitted 8/28-9/02/2021 with acute respiratory failure. PMH includes pulmonary fibrosis on 2 L O2 at rest and 5 L O2 with ambulation, CAD, DM2, gout, HLD, GERD, CKD stage IIIb.   Clinical Impression   PTA, pt lives alone and reports Modified Independence with all daily tasks with intermittent Rollator use. Pt recently hospitalized and discharged to SNF rehab. Pt presents now with primary deficit being cardiopulmonary tolerance. Pt motivated to complete hallway mobility with RW at min guard with one seated rest break. Pt overall Independent for UB ADLs and Min A for LB ADLs, including toileting task today. Pt physically capable of completing tasks but limited fatigue and significant dyspnea on exertion. Pt requires 6 L O2 for activity though 3-4/4 DOE with difficulty obtaining true SpO2 readings. Provided energy conservation handout with plans to further reinforce these strategies during daily tasks. Recommend DC back to SNF rehab.      Recommendations for follow up therapy are one component of a multi-disciplinary discharge planning process, led by the attending physician.  Recommendations may be updated based on patient status, additional functional criteria and insurance authorization.   Follow Up Recommendations  SNF;Supervision - Intermittent    Equipment Recommendations  None recommended by OT    Recommendations for Other Services       Precautions / Restrictions Precautions Precautions: Fall Precaution Comments: monitor SpO2/DOE Restrictions Weight Bearing Restrictions: No      Mobility Bed Mobility               General bed mobility comments: received in chair    Transfers Overall transfer level:  Modified independent Equipment used: Rolling walker (2 wheeled) Transfers: Sit to/from Stand Sit to Stand: Modified independent (Device/Increase time)         General transfer comment: to stand from recliner, from [facility] x 2 during toileting task. able to mobilize with min guard for safety    Balance Overall balance assessment: Needs assistance Sitting-balance support: No upper extremity supported;Feet supported Sitting balance-Leahy Scale: Good     Standing balance support: Bilateral upper extremity supported;During functional activity Standing balance-Leahy Scale: Fair Standing balance comment: fair static standing, but benefits from UE support during dynamic tasks                           ADL either performed or assessed with clinical judgement   ADL Overall ADL's : Needs assistance/impaired Eating/Feeding: Independent;Sitting   Grooming: Supervision/safety;Standing   Upper Body Bathing: Independent;Sitting   Lower Body Bathing: Min guard;Sit to/from stand   Upper Body Dressing : Independent;Sitting Upper Body Dressing Details (indicate cue type and reason): to don new gown once sleeves buttoned properly Lower Body Dressing: Min guard;Sit to/from stand   Toilet Transfer: Min guard;Ambulation;RW;BSC Toilet Transfer Details (indicate cue type and reason): after mobility in hallway, needed to use bathroom urgently - BSC provided Toileting- Clothing Manipulation and Hygiene: Minimal assistance;Sitting/lateral lean;Sit to/from stand Toileting - Clothing Manipulation Details (indicate cue type and reason): Min A for thoroughness due to fatigue. required rest break during task, able to perform hygiene seated and standing     Functional mobility during ADLs: Min guard;Rolling walker General ADL Comments: Pt able to mobilize in hallway with RW with one seated  rest break and complete ADLs with limited assist. Physical assistance primarily only needed when pt fatigued and  increasing SOB impacting physical completion of tasks     Vision Baseline Vision/History: 1 Wears glasses Ability to See in Adequate Light: 0 Adequate Patient Visual Report: No change from baseline Vision Assessment?: No apparent visual deficits     Perception     Praxis      Pertinent Vitals/Pain Pain Assessment: No/denies pain     Hand Dominance Right   Extremity/Trunk Assessment Upper Extremity Assessment Upper Extremity Assessment: Overall WFL for tasks assessed   Lower Extremity Assessment Lower Extremity Assessment: Defer to PT evaluation   Cervical / Trunk Assessment Cervical / Trunk Assessment: Kyphotic   Communication Communication Communication: No difficulties   Cognition Arousal/Alertness: Awake/alert Behavior During Therapy: WFL for tasks assessed/performed Overall Cognitive Status: Within Functional Limits for tasks assessed                                 General Comments: Pt is extremely motivated and pleasant. Good awareness of deficits and when rest breaks needed   General Comments  Pt received on 4 L O2 (variable O2 readings on pulse ox 73-80% though pt presentation not matching). Pt reports cold fingers and difficulty gaining readings. Ambulated based on pt presentation and reports improvemented with use of 6 L O2 with mobility though 3-4/4 DOE still noted with rest breaks needed. Daughter present and supportive    Exercises     Shoulder Instructions      Home Living Family/patient expects to be discharged to:: Private residence Living Arrangements: Alone Available Help at Discharge: Family;Available PRN/intermittently (most live out of state and/or work) Type of Home: House Home Access: Ramped entrance     Home Layout: Multi-level Alternate Level Stairs-Number of Steps: 3 steps with B rails to access den Alternate Level Stairs-Rails: Right;Left;Can reach both Bathroom Shower/Tub: Producer, television/film/video:  Handicapped height     Home Equipment: Environmental consultant - 4 wheels          Prior Functioning/Environment Level of Independence: Needs assistance  Gait / Transfers Assistance Needed: pt has been ambulating with rollator for a few months. ambulating in hallway at rehab, was able to tolerate multiple consecutive bouts of ambulation and ADLs when at SNF prior to this admission. 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              OT Problem List: Decreased strength;Decreased activity tolerance;Impaired balance (sitting and/or standing);Cardiopulmonary status limiting activity      OT Treatment/Interventions: Self-care/ADL training;Therapeutic exercise;Energy conservation;DME and/or AE instruction;Therapeutic activities;Patient/family education;Balance training    OT Goals(Current goals can be found in the care plan section) Acute Rehab OT Goals Patient Stated Goal: to return to rehab and improve activity tolerance in order to return to independence OT Goal Formulation: With patient Time For Goal Achievement: 01/13/21 Potential to Achieve Goals: Good  OT Frequency: Min 2X/week   Barriers to D/C:            Co-evaluation              AM-PAC OT "6 Clicks" Daily Activity     Outcome Measure Help from another person eating meals?: None Help from another person taking care of personal grooming?: A Little Help from another person toileting, which includes using toliet, bedpan, or urinal?: A Little Help from another person  bathing (including washing, rinsing, drying)?: A Little Help from another person to put on and taking off regular upper body clothing?: None Help from another person to put on and taking off regular lower body clothing?: A Little 6 Click Score: 20   End of Session Equipment Utilized During Treatment: Gait belt;Rolling walker;Oxygen Nurse Communication: Mobility status;Other (comment) (O2)  Activity Tolerance: Patient  tolerated treatment well Patient left: in chair;with call bell/phone within reach;with nursing/sitter in room  OT Visit Diagnosis: Unsteadiness on feet (R26.81);Muscle weakness (generalized) (M62.81)                Time: 1308-6578 OT Time Calculation (min): 38 min Charges:  OT General Charges $OT Visit: 1 Visit OT Evaluation $OT Eval Moderate Complexity: 1 Mod OT Treatments $Self Care/Home Management : 8-22 mins  Bradd Canary, OTR/L Acute Rehab Services Office: [phone]   Lorre Munroe 12/30/2020, 2:00 PM

## 2020-12-31 DIAGNOSIS — E119 Type 2 diabetes mellitus without complications: Secondary | ICD-10-CM | POA: Diagnosis not present

## 2020-12-31 DIAGNOSIS — J84112 Idiopathic pulmonary fibrosis: Secondary | ICD-10-CM | POA: Diagnosis not present

## 2020-12-31 DIAGNOSIS — J9611 Chronic respiratory failure with hypoxia: Secondary | ICD-10-CM | POA: Diagnosis not present

## 2020-12-31 DIAGNOSIS — N179 Acute kidney failure, unspecified: Secondary | ICD-10-CM | POA: Diagnosis not present

## 2020-12-31 LAB — BASIC METABOLIC PANEL
Anion gap: 6 (ref 5–15)
BUN: 14 mg/dL (ref 8–23)
CO2: 26 mmol/L (ref 22–32)
Calcium: 8.8 mg/dL — ABNORMAL LOW (ref 8.9–10.3)
Chloride: 105 mmol/L (ref 98–111)
Creatinine, Ser: 1.39 mg/dL — ABNORMAL HIGH (ref 0.44–1.00)
GFR, Estimated: 38 mL/min — ABNORMAL LOW (ref >60)
Glucose, Bld: 140 mg/dL — ABNORMAL HIGH (ref 70–99)
Potassium: 4 mmol/L (ref 3.5–5.1)
Sodium: 137 mmol/L (ref 135–145)

## 2020-12-31 LAB — GLUCOSE, CAPILLARY
Glucose-Capillary: 119 mg/dL — ABNORMAL HIGH (ref 70–99)
Glucose-Capillary: 156 mg/dL — ABNORMAL HIGH (ref 70–99)
Glucose-Capillary: 170 mg/dL — ABNORMAL HIGH (ref 70–99)
Glucose-Capillary: 140 mg/dL — ABNORMAL HIGH (ref 70–99)

## 2020-12-31 LAB — HEMOGLOBIN AND HEMATOCRIT, BLOOD
HCT: 23 % — ABNORMAL LOW (ref 36.0–46.0)
HCT: 34.1 % — ABNORMAL LOW (ref 36.0–46.0)
Hemoglobin: 7 g/dL — ABNORMAL LOW (ref 12.0–15.0)
Hemoglobin: 10.8 g/dL — ABNORMAL LOW (ref 12.0–15.0)

## 2020-12-31 MED ORDER — SODIUM CHLORIDE 0.9% IV SOLUTION: Freq: Once | INTRAVENOUS | Status: DC

## 2020-12-31 MED ORDER — FAMOTIDINE 20 MG PO TABS
20.0000 mg | ORAL_TABLET | Freq: Every day | ORAL | Status: DC
  Administered 2020-12-31 – 2021-01-03 (×4): 20 mg via ORAL
  Filled 2020-12-31 (×4): qty 1

## 2020-12-31 MED ORDER — FUROSEMIDE 10 MG/ML IJ SOLN
60.0000 mg | Freq: Once | INTRAMUSCULAR | Status: AC
  Administered 2020-12-31: 60 mg via INTRAVENOUS
  Filled 2020-12-31: qty 6

## 2020-12-31 NOTE — Progress Notes (Signed)
HOSPITAL MEDICINE OVERNIGHT EVENT NOTE    Notified by nursing hemoglobin dropped to 7 today, down from 7.6 yesterday.  Patient has known history of iron deficiency anemia.  Nursing reports patient is not experiencing any active chest pain or shortness of breath.  Patient is not exhibiting any clinical evidence of active bleeding.  Will continue to monitor.  Marinda Elk  MD [facility]

## 2020-12-31 NOTE — Progress Notes (Signed)
PROGRESS NOTE    Sierra Guzman  GDJ:[id number] DOB: 1940/10/31 DOA: 12/27/2020 PCP: System, Provider Not In    Brief Narrative:  [name] was admitted to the hospital with the working diagnosis of AKI on CKD stage 85b.    80 year old female past medical history for interstitial lung disease, chronic hypoxic respiratory failure, coronary artery disease, type 2 diabetes mellitus, gout, dyslipidemia, GERD and chronic kidney disease stage IIIb who presented with generalized weakness. She had recent hospitalization for pulmonary fibrosis exacerbation 12/04/2020-12/19/2020, treated with high dose corticosteroids.  Patient reported 7 days of persistent symptoms, with no improving worsening factors.  On her physical examination her temperature was 100.2 F, heart rate 82, respiratory rate 17, blood pressure 88/54, oxygen saturation 100% on 3 L per nasal cannula. Her lungs had rales bilaterally, heart S1-S2, present, rhythmic, soft abdomen, no lower extremity edema.  Positive sacral decubitus ulcer.   Sodium 136, potassium 4.5, chloride 103, bicarb 23, glucose 136, BUN 29, creatinine 1.91, white count 9.0, hemoglobin 8.3, hematocrit 28.0, platelets 273. SARS COVID-19 negative.   Urinalysis specific gravity 1.010, 0-5 red cells, 11-20 white cells.   Chest radiograph with bilateral interstitial infiltrates.   Patient placed on IV fluids with improvement of renal function. Continue to be very weak and deconditioned.    Hgb down to 7,0 with Hct 23,0, with persistent dyspnea. PRBC transfusion one unit today 12/31/20.   Assessment & Plan:   Principal Problem:   AKI (acute kidney injury) (HCC) Active Problems:   Diabetes type 2, controlled (HCC)   Hypertension   Chronic respiratory failure with hypoxia (HCC)   Anemia   Chronic diastolic CHF (congestive heart failure) (HCC)   IPF (idiopathic pulmonary fibrosis) (HCC)   Sacral decubitus ulcer   Occult blood positive stool   ARF (acute renal  failure) (HCC)   AKI on CKD stage 3b. (base serum cr 1,2 to 1,3) Serum cr today is 1,39 with K at 4,0 and serum bicarbonate 26. Patient has bee off IV fluids, stopped yesterday.  Renal US with increased parenchymal echogenicity and thinning of the renal cortex consistent with chronic medical renal disease. No hydronephrosis. Bilateral renal cysts.   Plan to transfuse 1 unit PRBC today and add one dose of furosemide post transfusion to prevent volume overload.  Check phosphorus in am, today's calcium is 8,8.  Change pantoprazole for famotidine.    2. Iron deficiency anemia/ anemia of chronic renal disease.  Serum iron 20,m TIBC 249, transferrin saturation 8 and ferritin 191.   Sp IV iron infusion.  Plan for one unit PRBC today.    3. HTN. stable blood pressure, will continue to hold on antihypertensive medications.   4. T2DM/ dyslipidemia.  Stable glucose, patient is tolerating po well. Continue metformin.  On rosuvastatin.   5.  Peripheral neuropathy/ gout. Continue with gabapentin.  On allopurinol.    6. ILD/ pulmonary fibrosis/ acute on chronic hypoxemic respiratory failure.   Patient with increase oxygen requirements today, worse with ambulation, when dropped oxygen saturation to 67% Continue supplemental 02 per [place], add PRBC transfusion today.    Patient continue to be at high risk for worsening renal and respiratory failure   Status is: Inpatient  Remains inpatient appropriate because:Inpatient level of care appropriate due to severity of illness  Dispo: The patient is from: SNF              Anticipated d/c is to: SNF  Patient currently is not medically stable to d/c.   Difficult to place patient No  DVT prophylaxis: Enoxaparin   Code Status:    full  Family Communication:  I spoke with patient's daughters at the bedside, we talked in detail about patient's condition, plan of care and prognosis and all questions were addressed.      Subjective: Patient with no nausea or vomiting, positive dyspnea and worsening oxygen desaturation on ambulation. No chest pain.   Objective: Vitals:   12/30/20 2312 12/31/20 0317 12/31/20 0737 12/31/20 1146  BP: (!) 98/56 103/60 (!) 114/58 116/60  Pulse: 78 74 89 85  Resp: 17 18 18 18   Temp: 98.6 F (37 C) 98.5 F (36.9 C) 97.8 F (36.6 C) 97.8 F (36.6 C)  TempSrc: Oral Oral Oral Oral  SpO2: 95% 96% 96% 100%  Weight:      Height:        Intake/Output Summary (Last 24 hours) at 12/31/2020 1150 Last data filed at 12/31/2020 0754 Gross per 24 hour  Intake 1440 ml  Output 1050 ml  Net 390 ml   Filed Weights   12/28/20 2148  Weight: 73.2 kg    Examination:   General: Not in pain, deconditioned  Neurology: Awake and alert, non focal  E ENT: positive pallor, no icterus, oral mucosa moist Cardiovascular: No JVD. S1-S2 present, rhythmic, no gallops, rubs, or murmurs. No lower extremity edema. Pulmonary: positive breath sounds bilaterally, with wheezing, scattered rhonchi, bilateral inspiratory rales. Gastrointestinal. Abdomen soft and non tender Skin. No rashes Musculoskeletal: no joint deformities     Data Reviewed: I have personally reviewed following labs and imaging studies  CBC: Recent Labs  Lab 12/27/20 1813 12/28/20 0400 12/28/20 1120 12/28/20 2000 12/29/20 0021 12/31/20 0011  WBC 9.0 8.7  --   --  8.8  --   NEUTROABS 5.7  --   --   --   --   --   HGB 8.3* 7.9* 7.8* 7.8* 7.6* 7.0*  HCT 28.0* 26.9* 26.5* 26.0* 25.0* 23.0*  MCV 96.9 97.1  --   --  94.7  --   PLT 273 200  --   --  213  --    Basic Metabolic Panel: Recent Labs  Lab 12/27/20 1813 12/28/20 0400 12/29/20 0021 12/30/20 0023 12/31/20 0011  NA 136 135 138 138 137  K 4.5 4.7 4.3 4.3 4.0  CL 103 104 108 107 105  CO2 23 23 25 26 26   GLUCOSE 136* 106* 111* 145* 140*  BUN 29* 24* 16 17 14   CREATININE 1.91* 1.51* 1.26* 1.46* 1.39*  CALCIUM 9.0 8.9 8.6* 8.6* 8.8*  MG  --   --  1.8   --   --    GFR: Estimated Creatinine Clearance: 30.9 mL/min (A) (by C-G formula based on SCr of 1.39 mg/dL (H)). Liver Function Tests: Recent Labs  Lab 12/27/20 1813  AST 24  ALT 8  ALKPHOS 44  BILITOT 0.4  PROT 5.8*  ALBUMIN 2.8*   No results for input(s): LIPASE, AMYLASE in the last 168 hours. No results for input(s): AMMONIA in the last 168 hours. Coagulation Profile: No results for input(s): INR, PROTIME in the last 168 hours. Cardiac Enzymes: No results for input(s): CKTOTAL, CKMB, CKMBINDEX, TROPONINI in the last 168 hours. BNP (last 3 results) No results for input(s): PROBNP in the last 8760 hours. HbA1C: No results for input(s): HGBA1C in the last 72 hours. CBG: Recent Labs  Lab 12/30/20 1151 12/30/20 1634 12/30/20  2100 12/31/20 0641 12/31/20 1140  GLUCAP 143* 202* 163* 119* 156*   Lipid Profile: Recent Labs    12/30/20 0023  CHOL 98  HDL 44  LDLCALC 43  TRIG 53  CHOLHDL 2.2   Thyroid Function Tests: No results for input(s): TSH, T4TOTAL, FREET4, T3FREE, THYROIDAB in the last 72 hours. Anemia Panel: Recent Labs    12/29/20 0021  VITAMINB12 1,247*  FOLATE 16.3  FERRITIN 191  TIBC 249*  IRON 20*  RETICCTPCT 2.5      Radiology Studies: I have reviewed all of the imaging during this hospital visit personally     Scheduled Meds:  sodium chloride   Intravenous Once   allopurinol  300 mg Oral Daily   docusate sodium  100 mg Oral BID   enoxaparin (LOVENOX) injection  40 mg Subcutaneous Q24H   furosemide  60 mg Intravenous Once   gabapentin  600 mg Oral BID   gabapentin  900 mg Oral QHS   Gerhardt's butt cream   Topical TID   insulin aspart  0-9 Units Subcutaneous TID WC   mometasone-formoterol  2 puff Inhalation BID   pantoprazole  80 mg Oral Daily   polyethylene glycol  17 g Oral Daily   rosuvastatin  40 mg Oral Daily   umeclidinium bromide  1 puff Inhalation Daily   Continuous Infusions:   LOS: 3 days        Sierra Meder Annett Gula, MD

## 2020-12-31 NOTE — Care Management Important Message (Signed)
Important Message  Patient Details  Name: Sierra Guzman MRN: [id number] Date of Birth: 01-11-1941   Medicare Important Message Given:  Yes     Dorena Bodo 12/31/2020, 2:53 PM

## 2020-12-31 NOTE — Plan of Care (Signed)
  Problem: Education: Goal: Knowledge of General Education information will improve Description: Including pain rating scale, medication(s)/side effects and non-pharmacologic comfort measures Outcome: Progressing   Problem: Health Behavior/Discharge Planning: Goal: Ability to manage health-related needs will improve Outcome: Progressing   Problem: Clinical Measurements: Goal: Ability to maintain clinical measurements within normal limits will improve Outcome: Progressing Goal: Diagnostic test results will improve Outcome: Progressing   Problem: Activity: Goal: Risk for activity intolerance will decrease Outcome: Progressing   Problem: Nutrition: Goal: Adequate nutrition will be maintained Outcome: Progressing   Problem: Pain Managment: Goal: General experience of comfort will improve Outcome: Progressing   Problem: Skin Integrity: Goal: Risk for impaired skin integrity will decrease Outcome: Progressing   

## 2020-12-31 NOTE — Progress Notes (Signed)
MD made aware of pts Hgb lab result 7.0

## 2021-01-01 DIAGNOSIS — E119 Type 2 diabetes mellitus without complications: Secondary | ICD-10-CM | POA: Diagnosis not present

## 2021-01-01 DIAGNOSIS — J9611 Chronic respiratory failure with hypoxia: Secondary | ICD-10-CM | POA: Diagnosis not present

## 2021-01-01 DIAGNOSIS — N1831 Chronic kidney disease, stage 3a: Secondary | ICD-10-CM

## 2021-01-01 DIAGNOSIS — N183 Chronic kidney disease, stage 3 unspecified: Secondary | ICD-10-CM

## 2021-01-01 DIAGNOSIS — J84112 Idiopathic pulmonary fibrosis: Secondary | ICD-10-CM | POA: Diagnosis not present

## 2021-01-01 DIAGNOSIS — N179 Acute kidney failure, unspecified: Secondary | ICD-10-CM | POA: Diagnosis not present

## 2021-01-01 LAB — TYPE AND SCREEN
ABO/RH(D): O POS
Antibody Screen: NEGATIVE
Unit division: 0

## 2021-01-01 LAB — HEMOGLOBIN AND HEMATOCRIT, BLOOD
HCT: 35.1 % — ABNORMAL LOW (ref 36.0–46.0)
Hemoglobin: 11 g/dL — ABNORMAL LOW (ref 12.0–15.0)

## 2021-01-01 LAB — GLUCOSE, CAPILLARY
Glucose-Capillary: 154 mg/dL — ABNORMAL HIGH (ref 70–99)
Glucose-Capillary: 224 mg/dL — ABNORMAL HIGH (ref 70–99)
Glucose-Capillary: 159 mg/dL — ABNORMAL HIGH (ref 70–99)
Glucose-Capillary: 161 mg/dL — ABNORMAL HIGH (ref 70–99)

## 2021-01-01 LAB — CULTURE, BLOOD (ROUTINE X 2)
Culture: NO GROWTH
Special Requests: ADEQUATE

## 2021-01-01 LAB — RENAL FUNCTION PANEL
Albumin: 2.7 g/dL — ABNORMAL LOW (ref 3.5–5.0)
Anion gap: 10 (ref 5–15)
BUN: 14 mg/dL (ref 8–23)
CO2: 24 mmol/L (ref 22–32)
Calcium: 9.4 mg/dL (ref 8.9–10.3)
Chloride: 100 mmol/L (ref 98–111)
Creatinine, Ser: 1.42 mg/dL — ABNORMAL HIGH (ref 0.44–1.00)
GFR, Estimated: 37 mL/min — ABNORMAL LOW (ref >60)
Glucose, Bld: 241 mg/dL — ABNORMAL HIGH (ref 70–99)
Phosphorus: 3.2 mg/dL (ref 2.5–4.6)
Potassium: 3.9 mmol/L (ref 3.5–5.1)
Sodium: 134 mmol/L — ABNORMAL LOW (ref 135–145)

## 2021-01-01 LAB — BPAM RBC
Blood Product Expiration Date: 202210222359
ISSUE DATE / TIME: 202209231654
Unit Type and Rh: 5100

## 2021-01-01 NOTE — TOC Progression Note (Addendum)
Transition of Care [facility]) - Progression Note    Patient Details  Name: Sierra Guzman MRN: [id number] Date of Birth: 1940/12/04  Transition of [facility]) CM/SW Contact  Patrice [place], [place] Phone Number: [phone] 01/01/2021, 8:49 AM  Clinical Narrative:     CSW followed up with [facility] and spoke with Crystal in regards to patient returning to facility. Crystal informed CSW that [facility] is not back and may not come back over the weekend. Facility confirmed that COVID is needed to return. Crystal was provided CSW contact information and ask if Berk[facility] Harvey is back to follow up .  CSW updated MD Arrien about pt's discharge.  TOC team will continue to assist with discharge planning needs.       Expected Discharge Plan and Services                                                 Social Determinants of Health (SDOH) Interventions    Readmission Risk Interventions No flowsheet data found.

## 2021-01-01 NOTE — Plan of Care (Signed)
  Problem: Education: Goal: Knowledge of General Education information will improve Description: Including pain rating scale, medication(s)/side effects and non-pharmacologic comfort measures Outcome: Progressing   Problem: Health Behavior/Discharge Planning: Goal: Ability to manage health-related needs will improve Outcome: Progressing   Problem: Clinical Measurements: Goal: Ability to maintain clinical measurements within normal limits will improve Outcome: Progressing Goal: Diagnostic test results will improve Outcome: Progressing   Problem: Pain Managment: Goal: General experience of comfort will improve Outcome: Progressing   Problem: Skin Integrity: Goal: Risk for impaired skin integrity will decrease Outcome: Progressing

## 2021-01-01 NOTE — Progress Notes (Signed)
Patient hypotensive since 0300. BP 80-90s/40-60s. Some improvement since 0700 with majority of pressures being in 90s/50s. Frequent monitoring of pressures with readings every 30 minutes this am. Patient asymptomatic.   Fine to course crackles throughout all lung fields except UR heard on assessment. 1-2+ pitting edema of left lower extremity, mild edema with no pitting in right. Patient states the left is usually more swollen than the right.   Has been having some dry coughing spells, more of a voluntary cough as the patient is trying to expel phlegm. This has been productive of light yellow sputum; however, takes effort on patient's behalf to expel. She was given an IS yesterday and has been using that.   Update was sent to Dr. Ella Jubilee via secure chat which he acknowledged, asking that we target MAPs >60 and saturations >88%. Continue to monitor.

## 2021-01-01 NOTE — Progress Notes (Addendum)
PROGRESS NOTE    Sierra Guzman  HYI:[id number] DOB: July 27, 1940 DOA: 12/27/2020 PCP: System, Provider Not In    Brief Narrative:  [name] was admitted to the hospital with the working diagnosis of AKI on CKD stage 82b.    80 year old female past medical history for interstitial lung disease, chronic hypoxic respiratory failure, coronary artery disease, type 2 diabetes mellitus, gout, dyslipidemia, GERD and chronic kidney disease stage IIIb who presented with generalized weakness. She had recent hospitalization for pulmonary fibrosis exacerbation 12/04/2020-12/19/2020, treated with high dose corticosteroids.  Patient reported 7 days of persistent symptoms, with no improving worsening factors.  On her physical examination her temperature was 100.2 F, heart rate 82, respiratory rate 17, blood pressure 88/54, oxygen saturation 100% on 3 L per nasal cannula. Her lungs had rales bilaterally, heart S1-S2, present, rhythmic, soft abdomen, no lower extremity edema.  Positive sacral decubitus ulcer.   Sodium 136, potassium 4.5, chloride 103, bicarb 23, glucose 136, BUN 29, creatinine 1.91, white count 9.0, hemoglobin 8.3, hematocrit 28.0, platelets 273. SARS COVID-19 negative.   Urinalysis specific gravity 1.010, 0-5 red cells, 11-20 white cells.   Chest radiograph with bilateral interstitial infiltrates.   Patient placed on IV fluids with improvement of renal function. Continue to be very weak and deconditioned.    Hgb down to 7,0 with Hct 23,0, with persistent dyspnea. PRBC transfusion one unit 12/31/20.    Assessment & Plan:   Principal Problem:   AKI (acute kidney injury) (HCC) Active Problems:   Diabetes type 2, controlled (HCC)   Hypertension   Chronic respiratory failure with hypoxia (HCC)   Anemia   Chronic diastolic CHF (congestive heart failure) (HCC)   IPF (idiopathic pulmonary fibrosis) (HCC)   CKD (chronic kidney disease) stage 3, GFR 30-59 ml/min (HCC)   AKI on CKD  stage 3b. (base serum cr 1,2 to 1,3) Renal US with increased parenchymal echogenicity and thinning of the renal cortex consistent with chronic medical renal disease. No hydronephrosis. Bilateral renal cysts.   Renal function today with serum cr at 1,42, K is 3,9 and serum bicarbonate at 24. Tolerated well PRBC transfusion. P is 3,2 and Ca 9,4 Urine output 2,300 yesterday.   Continue to hold on IV fluids for now, follow up on renal function in am, avoid hypotension (MAP less than 60) or nephrotoxic medications.    2. Iron deficiency anemia/ anemia of chronic renal disease.  Serum iron 20,m TIBC 249, transferrin saturation 8 and ferritin 191.   Sp IV iron infusion and one unit PRBC with good toleration.    3. HTN. Chronic diastolic heart failure. Positive hypotension, patient had furosemide with PRBC transfusion to prevent volume overload. This am systolic blood pressure 90 to 786, continue close monitoring.   No heart failure exacerbation.    4. T2DM/ dyslipidemia.  On metformin, glucose continue to be stable Continue with rosuvastatin.    5.  Peripheral neuropathy/ gout. on gabapentin.  Continue with allopurinol.    6. ILD/ pulmonary fibrosis/ acute on chronic hypoxemic respiratory failure.   Dyspnea at her baseline, her oxymetry is 97% on 4 L/min per [place]. Continue oxymetry monitoring and avoid volume overload.    Patient continue to be at high risk for worsening renal failure   Status is: Inpatient  Remains inpatient appropriate because:Inpatient level of care appropriate due to severity of illness  Dispo: The patient is from: SNF              Anticipated d/c is  to: SNF              Patient currently is not medically stable to d/c.   Difficult to place patient No   DVT prophylaxis:  Enoxaparin   Code Status:   full  Family Communication:  I spoke over the phone with the patient's daughter about patient's  condition, plan of care, prognosis and all questions were  addressed.     Subjective: Patient with stable dyspnea, no nausea or vomiting, left leg edema, her blood pressure has been low but patient denies dizziness or lightheadedness   Objective: Vitals:   01/01/21 0749 01/01/21 0804 01/01/21 1100 01/01/21 1128  BP: (!) 95/50 (!) 83/54    Pulse:  91    Resp:  15 16   Temp:  97.9 F (36.6 C) 98.2 F (36.8 C)   TempSrc:  Oral Oral   SpO2:  100%  97%  Weight:      Height:        Intake/Output Summary (Last 24 hours) at 01/01/2021 1225 Last data filed at 01/01/2021 1212 Gross per 24 hour  Intake 1360 ml  Output 3100 ml  Net -1740 ml   Filed Weights   12/28/20 2148  Weight: 73.2 kg    Examination:   General: Not in pain or dyspnea, deconditioned  Neurology: Awake and alert, non focal, upper extremities decreased endurance  E ENT: mild pallor, no icterus, oral mucosa moist Cardiovascular: No JVD. S1-S2 present, rhythmic, no gallops, rubs, or murmurs. Left ankle trace pitting edema. Pulmonary: positive breath sounds bilaterally with no wheezing, scattered rhonchi and positive bilateral rales. Gastrointestinal. Abdomen soft and non tender Skin. No rashes Musculoskeletal: no joint deformities     Data Reviewed: I have personally reviewed following labs and imaging studies  CBC: Recent Labs  Lab 12/27/20 1813 12/28/20 0400 12/28/20 1120 12/28/20 2000 12/29/20 0021 12/31/20 0011 12/31/20 2245 01/01/21 0034  WBC 9.0 8.7  --   --  8.8  --   --   --   NEUTROABS 5.7  --   --   --   --   --   --   --   HGB 8.3* 7.9*   < > 7.8* 7.6* 7.0* 10.8* 11.0*  HCT 28.0* 26.9*   < > 26.0* 25.0* 23.0* 34.1* 35.1*  MCV 96.9 97.1  --   --  94.7  --   --   --   PLT 273 200  --   --  213  --   --   --    < > = values in this interval not displayed.   Basic Metabolic Panel: Recent Labs  Lab 12/28/20 0400 12/29/20 0021 12/30/20 0023 12/31/20 0011 01/01/21 0034  NA 135 138 138 137 134*  K 4.7 4.3 4.3 4.0 3.9  CL 104 108 107 105 100   CO2 23 25 26 26 24   GLUCOSE 106* 111* 145* 140* 241*  BUN 24* 16 17 14 14   CREATININE 1.51* 1.26* 1.46* 1.39* 1.42*  CALCIUM 8.9 8.6* 8.6* 8.8* 9.4  MG  --  1.8  --   --   --   PHOS  --   --   --   --  3.2   GFR: Estimated Creatinine Clearance: 30.2 mL/min (A) (by C-G formula based on SCr of 1.42 mg/dL (H)). Liver Function Tests: Recent Labs  Lab 12/27/20 1813 01/01/21 0034  AST 24  --   ALT 8  --   ALKPHOS 44  --  BILITOT 0.4  --   PROT 5.8*  --   ALBUMIN 2.8* 2.7*   No results for input(s): LIPASE, AMYLASE in the last 168 hours. No results for input(s): AMMONIA in the last 168 hours. Coagulation Profile: No results for input(s): INR, PROTIME in the last 168 hours. Cardiac Enzymes: No results for input(s): CKTOTAL, CKMB, CKMBINDEX, TROPONINI in the last 168 hours. BNP (last 3 results) No results for input(s): PROBNP in the last 8760 hours. HbA1C: No results for input(s): HGBA1C in the last 72 hours. CBG: Recent Labs  Lab 12/31/20 1556 12/31/20 2026 01/01/21 0556 01/01/21 0816 01/01/21 1121  GLUCAP 170* 140* 154* 224* 159*   Lipid Profile: Recent Labs    12/30/20 0023  CHOL 98  HDL 44  LDLCALC 43  TRIG 53  CHOLHDL 2.2   Thyroid Function Tests: No results for input(s): TSH, T4TOTAL, FREET4, T3FREE, THYROIDAB in the last 72 hours. Anemia Panel: No results for input(s): VITAMINB12, FOLATE, FERRITIN, TIBC, IRON, RETICCTPCT in the last 72 hours.    Radiology Studies: I have reviewed all of the imaging during this hospital visit personally     Scheduled Meds:  sodium chloride   Intravenous Once   allopurinol  300 mg Oral Daily   docusate sodium  100 mg Oral BID   enoxaparin (LOVENOX) injection  40 mg Subcutaneous Q24H   famotidine  20 mg Oral Daily   gabapentin  600 mg Oral BID   gabapentin  900 mg Oral QHS   Gerhardt's butt cream   Topical TID   insulin aspart  0-9 Units Subcutaneous TID WC   mometasone-formoterol  2 puff Inhalation BID    polyethylene glycol  17 g Oral Daily   rosuvastatin  40 mg Oral Daily   umeclidinium bromide  1 puff Inhalation Daily   Continuous Infusions:   LOS: 4 days        Alyze Lauf Annett Gula, MD

## 2021-01-02 DIAGNOSIS — N1832 Chronic kidney disease, stage 3b: Secondary | ICD-10-CM

## 2021-01-02 DIAGNOSIS — J9611 Chronic respiratory failure with hypoxia: Secondary | ICD-10-CM | POA: Diagnosis not present

## 2021-01-02 DIAGNOSIS — D649 Anemia, unspecified: Secondary | ICD-10-CM | POA: Diagnosis not present

## 2021-01-02 DIAGNOSIS — N179 Acute kidney failure, unspecified: Secondary | ICD-10-CM | POA: Diagnosis not present

## 2021-01-02 LAB — GLUCOSE, CAPILLARY
Glucose-Capillary: 140 mg/dL — ABNORMAL HIGH (ref 70–99)
Glucose-Capillary: 244 mg/dL — ABNORMAL HIGH (ref 70–99)
Glucose-Capillary: 72 mg/dL (ref 70–99)
Glucose-Capillary: 155 mg/dL — ABNORMAL HIGH (ref 70–99)
Glucose-Capillary: 162 mg/dL — ABNORMAL HIGH (ref 70–99)

## 2021-01-02 LAB — BASIC METABOLIC PANEL
Anion gap: 8 (ref 5–15)
BUN: 19 mg/dL (ref 8–23)
CO2: 29 mmol/L (ref 22–32)
Calcium: 9.1 mg/dL (ref 8.9–10.3)
Chloride: 98 mmol/L (ref 98–111)
Creatinine, Ser: 1.56 mg/dL — ABNORMAL HIGH (ref 0.44–1.00)
GFR, Estimated: 33 mL/min — ABNORMAL LOW (ref >60)
Glucose, Bld: 150 mg/dL — ABNORMAL HIGH (ref 70–99)
Potassium: 3.9 mmol/L (ref 3.5–5.1)
Sodium: 135 mmol/L (ref 135–145)

## 2021-01-02 MED ORDER — ENOXAPARIN SODIUM 30 MG/0.3ML IJ SOSY
30.0000 mg | PREFILLED_SYRINGE | INTRAMUSCULAR | Status: DC
  Administered 2021-01-03: 30 mg via SUBCUTANEOUS
  Filled 2021-01-02: qty 0.3

## 2021-01-02 MED ORDER — SODIUM CHLORIDE 0.9 % IV BOLUS
250.0000 mL | Freq: Once | INTRAVENOUS | Status: AC
  Administered 2021-01-02: 250 mL via INTRAVENOUS

## 2021-01-02 NOTE — Progress Notes (Signed)
PROGRESS NOTE    Sierra Guzman  WVP:[id number] DOB: [birth year] DOA: 12/27/2020 PCP: System, Provider Not In    Brief Narrative:  [name] was admitted to the hospital with the working diagnosis of AKI on CKD stage 3b and symptomatic anemia.    80 year old female past medical history for interstitial lung disease, chronic hypoxic respiratory failure, coronary artery disease, type 2 diabetes mellitus, gout, dyslipidemia, GERD and chronic kidney disease stage IIIb who presented with generalized weakness. She had recent hospitalization for pulmonary fibrosis exacerbation 12/04/2020-12/19/2020, treated with high dose corticosteroids.  Patient reported 7 days of persistent symptoms, with no improving worsening factors.  On her physical examination her temperature was 100.2 F, heart rate 82, respiratory rate 17, blood pressure 88/54, oxygen saturation 100% on 3 L per nasal cannula. Her lungs had rales bilaterally, heart S1-S2, present, rhythmic, soft abdomen, no lower extremity edema.  Positive sacral decubitus ulcer.   Sodium 136, potassium 4.5, chloride 103, bicarb 23, glucose 136, BUN 29, creatinine 1.91, white count 9.0, hemoglobin 8.3, hematocrit 28.0, platelets 273. SARS COVID-19 negative.   Urinalysis specific gravity 1.010, 0-5 red cells, 11-20 white cells.   Chest radiograph with bilateral interstitial infiltrates.   Patient placed on IV fluids with improvement of renal function. Continue to be very weak and deconditioned.    Hgb down to 7,0 with Hct 23,0, with persistent dyspnea. PRBC transfusion one unit 12/31/20.   Patient with episodes of hypotension.  Pending transfer back to SNF.   Assessment & Plan:   Principal Problem:   AKI (acute kidney injury) (HCC) Active Problems:   Diabetes type 2, controlled (HCC)   Hypertension   Chronic respiratory failure with hypoxia (HCC)   Anemia   Chronic diastolic CHF (congestive heart failure) (HCC)   IPF (idiopathic pulmonary  fibrosis) (HCC)   CKD (chronic kidney disease) stage 3, GFR 30-59 ml/min (HCC)     AKI on CKD stage 3b. (base serum cr 1,2 to 1,3) Renal US with increased parenchymal echogenicity and thinning of the renal cortex consistent with chronic medical renal disease. No hydronephrosis. Bilateral renal cysts.    Sp PRBC transfusion #1 unit. Her blood pressure has been low, systolic in the 90's mmHg. Renal function with serum cr at 1,56 with K at 3,9 and serum bicarbonate at 29.   Urine output documented 1300 ml.   Plan to add 250 ml IV saline bolus #1, and continue close blood pressure monitoring.  Avoid hypotension or nephrotoxic medications.     2. Iron deficiency anemia/ anemia of chronic renal disease.  Serum iron 20,m TIBC 249, transferrin saturation 8 and ferritin 191. SP IV iron infusion and one unit PRBC with good toleration.   Follow up hgb up to 11,0 and hct 35,1   3. HTN. Chronic diastolic heart failure. No heart failure exacerbation.   Add bolus NS today to prevent further hypotension, patient is tolerating po well. Continue to hold on antihypertensive medications.    4. T2DM/ dyslipidemia.  Continue with metformin, for glucose control On rosuvastatin.    5.  Peripheral neuropathy/ gout.  Continue with gabapentin.  Continue with allopurinol.    6. ILD/ pulmonary fibrosis/ acute on chronic hypoxemic respiratory failure.   Oxymetry is 97% on 4 L/min per [place]. Continue oxymetry monitoring and avoid volume overload.  Limited functional physical capacity.    Patient continue to be at high risk for worsening hypotension and renal failure.   Status is: Inpatient  Remains inpatient appropriate because:Inpatient level  of care appropriate due to severity of illness  Dispo: The patient is from: SNF              Anticipated d/c is to: SNF              Patient currently is not medically stable to d/c.   Difficult to place patient No   DVT prophylaxis: Enoxaparin   Code  Status:    full  Family Communication:  No family at the bedside    Subjective: Patient with no nausea or vomiting, no chest pain, dyspnea at baseline, continue to be very weak and deconditioned. Blood pressure has been low, but not dizziness or lightheadedness   Objective: Vitals:   01/02/21 0401 01/02/21 0720 01/02/21 0721 01/02/21 0803  BP: (!) 92/54 (!) 100/56    Pulse: 72 75 75   Resp: 16 15    Temp: 98.2 F (36.8 C) 97.6 F (36.4 C)    TempSrc: Oral     SpO2: 98% 91%  100%  Weight:      Height:        Intake/Output Summary (Last 24 hours) at 01/02/2021 1036 Last data filed at 01/01/2021 2043 Gross per 24 hour  Intake 480 ml  Output 1300 ml  Net -820 ml   Filed Weights   12/28/20 2148  Weight: 73.2 kg    Examination:   General: Not in pain or dyspnea, deconditioned  Neurology: Awake and alert, non focal  E ENT: mild pallor, no icterus, oral mucosa moist Cardiovascular: No JVD. S1-S2 present, rhythmic, no gallops, rubs, or murmurs. No lower extremity edema. Pulmonary:  positive breath sounds bilaterally, with no wheezing, or rhonchi, positive bilateral rales. Gastrointestinal. Abdomen soft and non tender Skin. No rashes Musculoskeletal: no joint deformities     Data Reviewed: I have personally reviewed following labs and imaging studies  CBC: Recent Labs  Lab 12/27/20 1813 12/28/20 0400 12/28/20 1120 12/28/20 2000 12/29/20 0021 12/31/20 0011 12/31/20 2245 01/01/21 0034  WBC 9.0 8.7  --   --  8.8  --   --   --   NEUTROABS 5.7  --   --   --   --   --   --   --   HGB 8.3* 7.9*   < > 7.8* 7.6* 7.0* 10.8* 11.0*  HCT 28.0* 26.9*   < > 26.0* 25.0* 23.0* 34.1* 35.1*  MCV 96.9 97.1  --   --  94.7  --   --   --   PLT 273 200  --   --  213  --   --   --    < > = values in this interval not displayed.   Basic Metabolic Panel: Recent Labs  Lab 12/29/20 0021 12/30/20 0023 12/31/20 0011 01/01/21 0034 01/02/21 0042  NA 138 138 137 134* 135  K 4.3 4.3  4.0 3.9 3.9  CL 108 107 105 100 98  CO2 25 26 26 24 29   GLUCOSE 111* 145* 140* 241* 150*  BUN 16 17 14 14 19   CREATININE 1.26* 1.46* 1.39* 1.42* 1.56*  CALCIUM 8.6* 8.6* 8.8* 9.4 9.1  MG 1.8  --   --   --   --   PHOS  --   --   --  3.2  --    GFR: Estimated Creatinine Clearance: 27.5 mL/min (A) (by C-G formula based on SCr of 1.56 mg/dL (H)). Liver Function Tests: Recent Labs  Lab 12/27/20 1813 01/01/21 0034  AST  24  --   ALT 8  --   ALKPHOS 44  --   BILITOT 0.4  --   PROT 5.8*  --   ALBUMIN 2.8* 2.7*   No results for input(s): LIPASE, AMYLASE in the last 168 hours. No results for input(s): AMMONIA in the last 168 hours. Coagulation Profile: No results for input(s): INR, PROTIME in the last 168 hours. Cardiac Enzymes: No results for input(s): CKTOTAL, CKMB, CKMBINDEX, TROPONINI in the last 168 hours. BNP (last 3 results) No results for input(s): PROBNP in the last 8760 hours. HbA1C: No results for input(s): HGBA1C in the last 72 hours. CBG: Recent Labs  Lab 01/01/21 0816 01/01/21 1121 01/01/21 1645 01/02/21 0649 01/02/21 0839  GLUCAP 224* 159* 161* 140* 244*   Lipid Profile: No results for input(s): CHOL, HDL, LDLCALC, TRIG, CHOLHDL, LDLDIRECT in the last 72 hours. Thyroid Function Tests: No results for input(s): TSH, T4TOTAL, FREET4, T3FREE, THYROIDAB in the last 72 hours. Anemia Panel: No results for input(s): VITAMINB12, FOLATE, FERRITIN, TIBC, IRON, RETICCTPCT in the last 72 hours.    Radiology Studies: I have reviewed all of the imaging during this hospital visit personally     Scheduled Meds:  sodium chloride   Intravenous Once   allopurinol  300 mg Oral Daily   docusate sodium  100 mg Oral BID   enoxaparin (LOVENOX) injection  40 mg Subcutaneous Q24H   famotidine  20 mg Oral Daily   gabapentin  600 mg Oral BID   gabapentin  900 mg Oral QHS   Gerhardt's butt cream   Topical TID   insulin aspart  0-9 Units Subcutaneous TID WC    mometasone-formoterol  2 puff Inhalation BID   polyethylene glycol  17 g Oral Daily   rosuvastatin  40 mg Oral Daily   umeclidinium bromide  1 puff Inhalation Daily   Continuous Infusions:  sodium chloride       LOS: 5 days        Preslea Rhodus Annett Gula, MD

## 2021-01-02 NOTE — Plan of Care (Signed)

## 2021-01-02 NOTE — Plan of Care (Signed)

## 2021-01-03 DIAGNOSIS — N179 Acute kidney failure, unspecified: Secondary | ICD-10-CM | POA: Diagnosis not present

## 2021-01-03 DIAGNOSIS — J9611 Chronic respiratory failure with hypoxia: Secondary | ICD-10-CM | POA: Diagnosis not present

## 2021-01-03 DIAGNOSIS — D649 Anemia, unspecified: Secondary | ICD-10-CM | POA: Diagnosis not present

## 2021-01-03 DIAGNOSIS — N1832 Chronic kidney disease, stage 3b: Secondary | ICD-10-CM | POA: Diagnosis not present

## 2021-01-03 LAB — SARS CORONAVIRUS 2 (TAT 6-24 HRS): SARS Coronavirus 2: NEGATIVE

## 2021-01-03 LAB — BASIC METABOLIC PANEL
Anion gap: 10 (ref 5–15)
BUN: 18 mg/dL (ref 8–23)
CO2: 24 mmol/L (ref 22–32)
Calcium: 9.1 mg/dL (ref 8.9–10.3)
Chloride: 101 mmol/L (ref 98–111)
Creatinine, Ser: 1.47 mg/dL — ABNORMAL HIGH (ref 0.44–1.00)
GFR, Estimated: 36 mL/min — ABNORMAL LOW (ref >60)
Glucose, Bld: 144 mg/dL — ABNORMAL HIGH (ref 70–99)
Potassium: 3.9 mmol/L (ref 3.5–5.1)
Sodium: 135 mmol/L (ref 135–145)

## 2021-01-03 LAB — GLUCOSE, CAPILLARY
Glucose-Capillary: 151 mg/dL — ABNORMAL HIGH (ref 70–99)
Glucose-Capillary: 125 mg/dL — ABNORMAL HIGH (ref 70–99)
Glucose-Capillary: 160 mg/dL — ABNORMAL HIGH (ref 70–99)

## 2021-01-03 MED ORDER — FUROSEMIDE 20 MG PO TABS: 20.0000 mg | ORAL_TABLET | Freq: Every day | ORAL | Status: DC | PRN

## 2021-01-03 MED ORDER — GERHARDT'S BUTT CREAM: 1.0000 "application " | TOPICAL_CREAM | Freq: Three times a day (TID) | CUTANEOUS | 0 refills | Status: DC

## 2021-01-03 NOTE — Progress Notes (Signed)
Report given to Renita at [facility], facility is ready to accept patient.

## 2021-01-03 NOTE — TOC Initial Note (Signed)
Transition of Care [facility]) - Initial/Assessment Note    Patient Details  Name: Sierra Guzman MRN: [id number] Date of Birth: [birth year]  [facility] CM/SW Contact:    Tresa Endo Phone Number: 01/03/2021, 3:04 PM  Clinical Narrative:                 CSW received SNF consult. CSW met with pt at bedside. CSW introduced self and explained role at the hospital. Pt reports that PTA the pt was [facility] for [facility]. PT reports pt requires min guard and walked 70f with a wheeled walker. PT reports pt has a click score of 19.  CSW reviewed PT/OT recommendations for SNF. [facility] Pt gave CSW permission to fax out to the facility. [facility]  CSW will continue to follow.  Expected Discharge Plan: Skilled Nursing Facility Barriers to Discharge: Continued Medical Work up   Patient Goals and CMS Choice Patient states their goals for this hospitalization and ongoing recovery are:: Return to rehab CMS Medicare.gov Compare Post Acute Care list provided to:: Patient Choice offered to / list presented to : Patient  Expected Discharge Plan and Services Expected Discharge Plan: [place]house Referral: Clinical Social Work   Post Acute Care Choice: SCentralLiving arrangements for the past 2 months: [place]Expected Discharge Date: 01/03/21                                    Prior Living Arrangements/Services Living arrangements for the past 2 months: [place]ives with:: Self, Facility Resident Patient language and need for interpreter reviewed:: Yes Do you feel safe going back to the place where you live?: Yes      Need for Family Participation in Patient Care: Yes (Comment) Care giver support system in place?: Yes (comment)   Criminal Activity/Legal Involvement  Pertinent to Current Situation/Hospitalization: No - Comment as needed  Activities of Daily Living   ADL Screening (condition at time of admission) Patient's cognitive ability adequate to safely complete daily activities?: Yes Is the patient deaf or have difficulty hearing?: No Does the patient have difficulty seeing, even when wearing glasses/contacts?: No Does the patient have difficulty concentrating, remembering, or making decisions?: No Patient able to express need for assistance with ADLs?: Yes Does the patient have difficulty dressing or bathing?: Yes Independently performs ADLs?: Yes (appropriate for developmental age) Does the patient have difficulty walking or climbing stairs?: Yes Weakness of Legs: Both Weakness of Arms/Hands: None  Permission Sought/Granted Permission sought to share information with : Family Supports, FChartered certified accountantgranted to share information with : Yes, Verbal Permission Granted  Share Information with NAME: PCreola Corn Permission granted to share info w AGENCY: MVincentgranted to share info w Relationship: Daughter  Permission granted to share info w Contact Information: [phone] Emotional Assessment Appearance:: Appears stated age Attitude/Demeanor/Rapport: Engaged Affect (typically observed): Appropriate Orientation: : Oriented to Place, Oriented to  Time, Oriented to Situation, Oriented to Self Alcohol / Substance Use: Not Applicable Psych Involvement: No (comment)  Admission diagnosis:  ARF (acute renal failure) (HCC) [N17.9] AKI (acute kidney injury) ([place] [N17.9] Hypotension, unspecified hypotension type [I95.9] Gastrointestinal hemorrhage, unspecified gastrointestinal hemorrhage type [K92.2] Patient Active Problem List  Diagnosis Date Noted   CKD (chronic kidney disease) stage 3, GFR 30-59 ml/min (HCC) 01/01/2021   ARF (acute renal failure) ([place]) 12/28/2020   Chronic diastolic CHF  (congestive heart failure) ([place]) 12/27/2020   AKI (acute kidney injury) ([place]) 12/27/2020   IPF (idiopathic pulmonary fibrosis) ([place]) 12/27/2020   Sacral decubitus ulcer 12/27/2020   Occult blood positive stool 12/27/2020   DDD (degenerative disc disease), cervical 12/21/2020   Hereditary and idiopathic peripheral neuropathy 12/21/2020   Localized, primary osteoarthritis 12/21/2020   Low back pain 12/21/2020   Pain in limb 12/21/2020   Chronic respiratory failure with hypoxia ([place]) 12/05/2020   Acute respiratory failure with hypoxia ([place]) 12/05/2020   Hip pain 08/06/2020   Spinal stenosis in cervical region 08/06/2020   Spondylolisthesis, congenital 08/06/2020   Status post total hip replacement, 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   Primary localized osteoarthritis of right hip 12/12/2016   B12 deficiency 10/28/2010   Coronary artery disease 10/28/2010   Diabetes type 2, controlled ([place]) 10/28/2010   Gout 10/28/2010   Hyperlipidemia with target LDL less than 100 10/28/2010   Hypertension 10/28/2010   Osteoarthritis of left knee 10/28/2010   Anemia 10/28/2010   History of colonic polyps 10/28/2010   PCP:  System, Provider Not In Pharmacy:   [id number] [place], [id number]: [phone]: [phone]    Social Determinants of Health (SDOH) Interventions    Readmission Risk Interventions No flowsheet data found.

## 2021-01-03 NOTE — Progress Notes (Signed)
Physical Therapy Treatment Patient Details Name: Sierra Guzman MRN: [id number] DOB: 1940-06-18 Today's Date: 01/03/2021   History of Present Illness 80 y.o. female presents [facility] ED on 12/27/2020 with generalized weakness. Pt admitted for acute renal failure. Pt recently admitted 8/28-9/02/2021 with acute respiratory failure. PMH includes pulmonary fibrosis on 2 L O2 at rest and 5 L O2 with ambulation, CAD, DM2, gout, HLD, GERD, CKD stage IIIb.    PT Comments    Patient progressing slowly towards PT goals. Continues to be motivated to participate in therapy and eager to get to rehab. Tolerated gait training with Min guard assist and use of RW for support; Sp02 ranged from 70-94% on 6L 02 [place]. 3/4 DOE present during activity requiring long seated rest break due to SOB and productive cough. Reviewed pursed lip breathing. Takes minutes to recover to high 80s-90s. Discussed importance of short bouts of activity with longer rest breaks. Continues to have difficulty with activity tolerance, endurance and 02 saturations. Continue to recommend SNF. Will follow.   Recommendations for follow up therapy are one component of a multi-disciplinary discharge planning process, led by the attending physician.  Recommendations may be updated based on patient status, additional functional criteria and insurance authorization.  Follow Up Recommendations  SNF     Equipment Recommendations  None recommended by PT    Recommendations for Other Services       Precautions / Restrictions Precautions Precautions: Fall Precaution Comments: monitor SpO2/DOE Restrictions Weight Bearing Restrictions: No     Mobility  Bed Mobility               General bed mobility comments: received in chair    Transfers Overall transfer level: Modified independent Equipment used: Rolling walker (2 wheeled) Transfers: Sit to/from Stand Sit to Stand: Modified independent (Device/Increase time)         General  transfer comment: Able to stand from chair without difficulty.  Ambulation/Gait Ambulation/Gait assistance: Min guard;Supervision Gait Distance (Feet): 50 Feet (x2 bouts) Assistive device: Rolling walker (2 wheeled) Gait Pattern/deviations: Step-through pattern;Decreased stride length Gait velocity: reduced Gait velocity interpretation: 1.31 - 2.62 ft/sec, indicative of limited community ambulator General Gait Details: Slow, mostly steady gait with RW for support; 3/4 DOE noted with seated rest break. + productive cough. Requires a long time to recover due to SOB. Sp02 dropped to 70% on 6L/min 02 [place].   Stairs             Wheelchair Mobility    Modified Rankin (Stroke Patients Only)       Balance Overall balance assessment: Needs assistance Sitting-balance support: No upper extremity supported;Feet supported Sitting balance-Leahy Scale: Good     Standing balance support: During functional activity Standing balance-Leahy Scale: Fair Standing balance comment: fair static standing, but benefits from UE support during dynamic tasks                            Cognition Arousal/Alertness: Awake/alert Behavior During Therapy: WFL for tasks assessed/performed Overall Cognitive Status: Within Functional Limits for tasks assessed                                 General Comments: Pt is extremely motivated and pleasant. Good awareness of deficits and when rest breaks needed      Exercises      General Comments General comments (skin integrity, edema, etc.): Sp02  ranged from 70-94% on 3-6L 02 [place]. 3/4 DOE present during activity. Reviewed pursed lip breathing. Takes minutes to recover to high 80s-90s.      Pertinent Vitals/Pain Pain Assessment: No/denies pain    Home Living                      Prior Function            PT Goals (current goals can now be found in the care plan section) Progress towards PT goals: Progressing toward  goals (slowly but limited by tolerance/02)    Frequency    Min 2X/week      PT Plan Current plan remains appropriate    Co-evaluation              AM-PAC PT "6 Clicks" Mobility   Outcome Measure  Help needed turning from your back to your side while in a flat bed without using bedrails?: None Help needed moving from lying on your back to sitting on the side of a flat bed without using bedrails?: None Help needed moving to and from a bed to a chair (including a wheelchair)?: A Little Help needed standing up from a chair using your arms (e.g., wheelchair or bedside chair)?: A Little Help needed to walk in hospital room?: A Little Help needed climbing 3-5 steps with a railing? : A Lot 6 Click Score: 19    End of Session Equipment Utilized During Treatment: Oxygen Activity Tolerance: Treatment limited secondary to medical complications (Comment) (DOE) Patient left: in chair;with call bell/phone within reach Nurse Communication: Mobility status PT Visit Diagnosis: Other abnormalities of gait and mobility (R26.89);Muscle weakness (generalized) (M62.81)     Time: 9323-5573 PT Time Calculation (min) (ACUTE ONLY): 23 min  Charges:  $Gait Training: 8-22 mins $Therapeutic Exercise: 8-22 mins                     Vale Haven, PT, DPT Acute Rehabilitation Services Pager [phone] Office [phone]      Blake Divine A Lanier Ensign 01/03/2021, 10:13 AM

## 2021-01-03 NOTE — Discharge Summary (Signed)
Physician Discharge Summary  Sierra Guzman BMW:[id number] DOB: July 11, 1940 DOA: 12/27/2020  PCP: System, Provider Not In  Admit date: 12/27/2020 Discharge date: 01/03/2021  Admitted From: SNF  Disposition:   SNF   Recommendations for Outpatient Follow-up and new medication changes:  Follow up with Primary Care in 7 to 10 days.  Follow complete blood count and renal panel as outpatient.  Holding candesartan to prevent hypotension Hold aspirin in the setting or iron deficiency anemia  Hold on oral iron supplementation and follow up on iron panel as outpatient.   Home Health: na   Equipment/Devices: na    Discharge Condition: stable  CODE STATUS: full  Diet recommendation:  heart healthyt  Brief/Interim Summary: Sierra Guzman was admitted to the hospital with the working diagnosis of AKI on CKD stage 3b and symptomatic anemia.    80 year old female past medical history for interstitial lung disease, chronic hypoxic respiratory failure, coronary artery disease, type 2 diabetes mellitus, gout, dyslipidemia, GERD and chronic kidney disease stage IIIb who presented with generalized weakness. She had recent hospitalization for pulmonary fibrosis exacerbation 12/04/2020-12/19/2020, treated with high dose corticosteroids.  Patient reported 7 days of persistent symptoms, with no improving or worsening factors.  On her physical examination her temperature was 100.2 F, heart rate 82, respiratory rate 17, blood pressure 88/54, oxygen saturation 100% on 3 L per nasal cannula. Her lungs had rales bilaterally, heart S1-S2, present, rhythmic, soft abdomen, no lower extremity edema.  Positive sacral decubitus ulcer.   Sodium 136, potassium 4.5, chloride 103, bicarb 23, glucose 136, BUN 29, creatinine 1.91, white count 9.0, hemoglobin 8.3, hematocrit 28.0, platelets 273. SARS COVID-19 negative.   Urinalysis specific gravity 1.010, 0-5 red cells, 11-20 white cells.   Chest radiograph with bilateral  interstitial infiltrates.   Patient was placed on IV fluids with improvement of renal function. Continue to be very weak and deconditioned.    Hgb down to 7,0 with Hct 23,0, with persistent dyspnea. PRBC transfusion one unit 12/31/20.    Patient with episodes of hypotension, that have resolved.   Acute kidney injury on chronic kidney disease stage IIIb. Baseline creatinine 1.2-1.3. Patient received intravenous fluids with good toleration.  Her kidney function has been improving. Patient received 1 unit packed red blood cells with good toleration.  Further renal calculi renal ultrasonography showed increased parenchymal echogenicity and thinning of the renal cortex consistent with chronic medical renal disease.  No hydronephrosis, bilateral renal cysts.  At discharge her sodium was 135, potassium 3.9, chloride 101, bicarb 24, glucose 144, BUN 18, creatinine 1.47.  Plan to follow-up kidney function as an outpatient.  Use diuretics only as needed.  2.  Iron deficiency anemia, anemia of chronic renal disease.  Iron stores show serum iron 20, TIBC 249, transferrin saturation of 8, ferritin 191. Patient received intravenous iron with good toleration. Follow-up hemoglobin 11.0, hematocrit 35.1. Follow-up cell count as an outpatient. For now will hold on aspirin.   3.  Type 2 diabetes mellitus.  Dyslipidemia.  Continue glucose control with metformin. Continue with rosuvastatin.  4.  Hypertension.  Chronic diastolic heart failure.  No signs of acute exacerbation, her candesartan have been on hold. Use furosemide only as needed. Close follow-up on blood pressure. At discharge will resume metoprolol.   5.  Peripheral neuropathy.  Gout.  Continue gabapentin and allopurinol. No acute gout flare.  6.  Essential lung disease, pulmonary fibrosis, acute on chronic hypoxemic respiratory failure. Patient received supplemental oxygen per nasal cannula, she tolerated  well PRBC transfusion. She has  a limited functional physical capacity.  She will return to skilled nursing facility for rehab.  At her discharge her oximetry is 95% on 3 L of supplemental oxygen per nasal cannula.  Discharge Diagnoses:  Principal Problem:   AKI (acute kidney injury) (HCC) Active Problems:   Diabetes type 2, controlled (HCC)   Hypertension   Chronic respiratory failure with hypoxia (HCC)   Anemia   Chronic diastolic CHF (congestive heart failure) (HCC)   IPF (idiopathic pulmonary fibrosis) (HCC)   CKD (chronic kidney disease) stage 3, GFR 30-59 ml/min (HCC)    Discharge Instructions   Allergies as of 01/03/2021       Reactions   Morphine And Related Nausea And Vomiting        Medication List     STOP taking these medications    aspirin EC 81 MG tablet   candesartan 32 MG tablet Commonly known as: ATACAND   ferrous gluconate 324 MG tablet Commonly known as: FERGON   predniSONE 10 MG tablet Commonly known as: DELTASONE   predniSONE 5 MG tablet Commonly known as: DELTASONE       TAKE these medications    albuterol 108 (90 Base) MCG/ACT inhaler Commonly known as: VENTOLIN HFA Inhale 1-2 puffs into the lungs every 6 (six) hours as needed for wheezing or shortness of breath.   allopurinol 300 MG tablet Commonly known as: ZYLOPRIM Take 300 mg by mouth daily.   cholecalciferol 1000 units tablet Commonly known as: VITAMIN D Take 1,000 Units by mouth daily.   Cinnamon 500 MG capsule Take 500 mg by mouth daily.   docusate sodium 100 MG capsule Commonly known as: COLACE Take 1 capsule (100 mg total) by mouth 2 (two) times daily.   Fish Oil 1000 MG Caps Take 1 capsule by mouth daily.   Fluticasone-Salmeterol 500-50 MCG/DOSE Aepb Commonly known as: ADVAIR Inhale 1 puff into the lungs every 12 (twelve) hours.   furosemide 20 MG tablet Commonly known as: LASIX Take 1 tablet (20 mg total) by mouth daily as needed for edema. What changed:  when to take this reasons  to take this   gabapentin 300 MG capsule Commonly known as: NEURONTIN Take 600-900 mg by mouth See admin instructions. Take 600mg  at 8am, 600mg  at 2pm, and 900mg  at bedtime   Gerhardt's butt cream Crea Apply 1 application topically 3 (three) times daily.   GlucoCom Blood Glucose Monitor Devi See admin instructions.   Incruse Ellipta 62.5 MCG/INH Aepb Generic drug: umeclidinium bromide Inhale 1 puff into the lungs daily.   loratadine 10 MG tablet Commonly known as: CLARITIN Take 10 mg by mouth daily.   magnesium oxide 400 MG tablet Commonly known as: MAG-OX Take 1 tablet by mouth daily.   metFORMIN 850 MG tablet Commonly known as: Glucophage Take 1 tablet (850 mg total) by mouth 2 (two) times daily with a meal. What changed: how much to take   metoprolol succinate 25 MG 24 hr tablet Commonly known as: TOPROL-XL Take 1 tablet (25 mg total) by mouth daily.   Multi-Vitamin tablet Take 1 tablet by mouth as needed.   omeprazole 20 MG capsule Commonly known as: PRILOSEC Take 20 mg by mouth daily.   polyethylene glycol 17 g packet Commonly known as: MIRALAX / GLYCOLAX Take 17 g by mouth daily.   rosuvastatin 40 MG tablet Commonly known as: CRESTOR Take 40 mg by mouth daily.   vitamin B-12 1000 MCG tablet Commonly known as:  CYANOCOBALAMIN Take 1,000 mcg by mouth daily.   Vitamin D (Ergocalciferol) 1.25 MG (50000 UNIT) Caps capsule Commonly known as: DRISDOL Take 1 capsule (50,000 Units total) by mouth every 7 (seven) days.               Discharge Care Instructions  (From admission, onward)           Start     Ordered   01/03/21 0000  Discharge wound care:       Comments: Cleanse buttocks, perineal area and medial thighs with house skin cleanser, pat gently dry. Apply thin layer of Gerhart's butt cream. Turn patient from side to side.   01/03/21 1610            Allergies  Allergen Reactions   Morphine And Related Nausea And Vomiting        Procedures/Studies: DG Chest 2 View  Result Date: 12/13/2020 CLINICAL DATA:  Dyspnea EXAM: CHEST - 2 VIEW COMPARISON:  12/04/2020 FINDINGS: Slight worsening extensive diffuse and bilateral reticulonodular interstitial opacities. This may represent some component of superimposed edema or infection on chronic fibrotic lung disease. Heart is enlarged. No large effusion or pneumothorax. Low lung volumes. Aorta atherosclerotic and degenerative changes of the spine. IMPRESSION: Slight worsening diffuse interstitial opacities concerning for superimposed edema, less likely infection on background chronic fibrotic lung disease. No large effusion or pneumothorax Aortic Atherosclerosis (ICD10-I70.0). Electronically Signed   By: Judie Petit.  Shick M.D.   On: 12/13/2020 10:54   CT Angio Chest PE W and/or Wo Contrast  Result Date: 12/05/2020 CLINICAL DATA:  Weakness, confusion, shortness of breath EXAM: CT ANGIOGRAPHY CHEST WITH CONTRAST TECHNIQUE: Multidetector CT imaging of the chest was performed using the standard protocol during bolus administration of intravenous contrast. Multiplanar CT image reconstructions and MIPs were obtained to evaluate the vascular anatomy. CONTRAST:  75mL OMNIPAQUE IOHEXOL 350 MG/ML SOLN COMPARISON:  CT chest dated 11/03/2020 FINDINGS: Cardiovascular: Satisfactory opacification of the bilateral pulmonary arteries to the segmental level. No evidence of pulmonary embolism. Although not tailored for evaluation of the thoracic aorta, there is no evidence thoracic aortic aneurysm or dissection. The heart is top-normal in size.  No pericardial effusion. Coronary atherosclerosis of the LAD and left circumflex. Mediastinum/Nodes: Mild mediastinal lymphadenopathy, including a 15 mm short axis low right paratracheal node (series 5/image 113), likely reactive. Visualized thyroid is unremarkable. Lungs/Pleura: Subpleural reticulation/fibrosis in the lungs bilaterally, lower lobe predominant with  associated bronchiectasis. This appearance is similar to the recent prior, reflecting sequela of chronic interstitial lung disease. Progressive ground-glass in the bilateral lower lobes favor atelectasis due to poor inspiration. No suspicious pulmonary nodules. No focal consolidation. No pleural effusion or pneumothorax. Upper Abdomen: Visualized upper abdomen is notable for a 3.2 cm left upper pole renal cyst. Musculoskeletal: Mild degenerative changes of the visualized thoracolumbar spine. Review of the MIP images confirms the above findings. IMPRESSION: No evidence of pulmonary embolism. Chronic interstitial lung disease. Mild mediastinal lymphadenopathy, likely reactive. Aortic Atherosclerosis (ICD10-I70.0). Electronically Signed   By: Charline Bills M.D.   On: 12/05/2020 01:25   US RENAL  Result Date: 12/30/2020 CLINICAL DATA:  Acute kidney injury. History of chronic kidney disease. EXAM: RENAL / URINARY TRACT ULTRASOUND COMPLETE COMPARISON:  None. FINDINGS: Right Kidney: Renal measurements: 9.1 x 4.2 x 6.9 cm = volume: 137 mL. No hydronephrosis. Thinning of the renal parenchyma with increased parenchymal echogenicity. Cyst arising from the lower pole measures 5.4 x 5.6 x 5.3 cm, with a small adjacent subcentimeter cyst. There is  also a 6 x 9 x 6 mm cyst in the mid kidney. No visualized solid lesion or stone. Left Kidney: Renal measurements: 8.9 x 5.5 x 5.4 cm = volume: 137 mL. No hydronephrosis. Mild thinning of the renal parenchyma and increased renal parenchymal echogenicity. Complex cyst in the upper pole measures 2.8 x 2.7 x 2.8 cm with thin internal septations. No septal vascularity. No evidence of solid lesion or stone. Bladder: Appears normal for degree of bladder distention. Other: None. IMPRESSION: 1. Increased renal parenchymal echogenicity in thinning of the renal cortex consistent with chronic medical renal disease. No hydronephrosis. 2. Bilateral renal cysts, including a septated cyst in  the left kidney measuring 2.8 cm. The cysts are considered benign, and no dedicated imaging follow-up is needed. Electronically Signed   By: Narda Rutherford M.D.   On: 12/30/2020 19:02   DG Chest Portable 1 View  Result Date: 12/27/2020 CLINICAL DATA:  Generalized weakness and tremors. EXAM: PORTABLE CHEST 1 VIEW COMPARISON:  December 21, 2020 FINDINGS: Stable, chronic interstitial lung disease is seen throughout both lungs. There is no evidence of focal consolidation, pleural effusion or pneumothorax. The heart size and mediastinal contours are within normal limits. The visualized skeletal structures are unremarkable. IMPRESSION: Chronic interstitial lung disease without evidence of acute or active cardiopulmonary disease. Electronically Signed   By: Aram Candela M.D.   On: 12/27/2020 18:24   DG Chest Portable 1 View  Result Date: 12/21/2020 CLINICAL DATA:  Chest pain EXAM: PORTABLE CHEST 1 VIEW COMPARISON:  12/17/2020 FINDINGS: Chronic interstitial markings with subpleural reticulation/fibrosis in the lungs bilaterally. No pleural effusion or pneumothorax. The heart is normal in size. IMPRESSION: Stable chronic interstitial lung disease. Electronically Signed   By: Charline Bills M.D.   On: 12/21/2020 20:08   DG Chest Port 1 View  Result Date: 12/17/2020 CLINICAL DATA:  Interstitial edema EXAM: PORTABLE CHEST 1 VIEW COMPARISON:  Chest radiograph 12/13/2020 FINDINGS: The cardiomediastinal silhouette is stable. There are patchy and interstitial opacities throughout both lungs. Aeration of the left mid lung has slightly improved, while aeration of the remainder of the lungs is not significantly changed. There is no significant pleural effusion. There is no pneumothorax. There is no acute osseous abnormality. IMPRESSION: Patchy and interstitial opacities throughout both lungs with slight interval improvement in aeration of the left mid lung. Electronically Signed   By: Lesia Hausen M.D.   On:  12/17/2020 16:52   DG Chest Port 1 View  Result Date: 12/04/2020 CLINICAL DATA:  Weakness, confusion, shortness of breath EXAM: PORTABLE CHEST 1 VIEW COMPARISON:  CT chest dated 11/03/2020 FINDINGS: Subpleural reticulation/fibrosis in the lungs bilaterally. Associated lower lobe bronchiectasis. This appearance is unchanged from the prior, reflecting chronic interstitial lung disease. No superimposed consolidation.  No pleural effusion or pneumothorax. The heart is normal in size. IMPRESSION: Stable chronic interstitial lung disease. Electronically Signed   By: Charline Bills M.D.   On: 12/04/2020 23:22   ECHOCARDIOGRAM COMPLETE  Result Date: 12/09/2020    ECHOCARDIOGRAM REPORT   Patient Name:   Sierra Guzman Date of Exam: 12/09/2020 Medical Rec #:  [id number]    Height:       65.0 in Accession #:    [id number]   Weight:       154.3 lb Date of Birth:  [birth year]    BSA:          1.772 m Patient Age:    80 years     BP:  120/59 mmHg Patient Gender: F            HR:           70 bpm. Exam Location:  [facility] Procedure: 2D Echo, Cardiac Doppler, Color Doppler and Strain Analysis Indications:     Dyspnea R06.00  History:         Patient has no prior history of Echocardiogram examinations.                  Risk Factors:Hypertension and Diabetes.  Sonographer:     Cristela Blue Referring Phys:  [id number] Gillis Santa Diagnosing Phys: Julien Nordmann MD  Sonographer Comments: Global longitudinal strain was attempted. IMPRESSIONS  1. Left ventricular ejection fraction, by estimation, is 60 to 65%. The left ventricle has normal function. The left ventricle has no regional wall motion abnormalities. Left ventricular diastolic parameters are consistent with Grade I diastolic dysfunction (impaired relaxation). The average left ventricular global longitudinal strain is -16.6 %. The global longitudinal strain is normal.  2. Right ventricular systolic function is normal. The right ventricular size is normal.  3. The aortic  valve is normal in structure. Aortic valve regurgitation is not visualized. Mild to moderate aortic valve sclerosis/calcification is present, without any evidence of aortic stenosis. FINDINGS  Left Ventricle: Left ventricular ejection fraction, by estimation, is 60 to 65%. The left ventricle has normal function. The left ventricle has no regional wall motion abnormalities. The average left ventricular global longitudinal strain is -16.6 %. The global longitudinal strain is normal. The left ventricular internal cavity size was normal in size. There is no left ventricular hypertrophy. Left ventricular diastolic parameters are consistent with Grade I diastolic dysfunction (impaired relaxation). Right Ventricle: The right ventricular size is normal. No increase in right ventricular wall thickness. Right ventricular systolic function is normal. Left Atrium: Left atrial size was normal in size. Right Atrium: Right atrial size was normal in size. Pericardium: There is no evidence of pericardial effusion. Mitral Valve: The mitral valve is normal in structure. No evidence of mitral valve regurgitation. No evidence of mitral valve stenosis. Tricuspid Valve: The tricuspid valve is normal in structure. Tricuspid valve regurgitation is mild . No evidence of tricuspid stenosis. Aortic Valve: The aortic valve is normal in structure. Aortic valve regurgitation is not visualized. Mild to moderate aortic valve sclerosis/calcification is present, without any evidence of aortic stenosis. Aortic valve mean gradient measures 11.0 mmHg.  Aortic valve peak gradient measures 18.5 mmHg. Aortic valve area, by VTI measures 2.11 cm. Pulmonic Valve: The pulmonic valve was normal in structure. Pulmonic valve regurgitation is not visualized. No evidence of pulmonic stenosis. Aorta: The aortic root is normal in size and structure. Venous: The inferior vena cava is normal in size with greater than 50% respiratory variability, suggesting right  atrial pressure of 3 mmHg. IAS/Shunts: No atrial level shunt detected by color flow Doppler.  LEFT VENTRICLE PLAX 2D LVIDd:         3.90 cm  Diastology LVIDs:         2.40 cm  LV e' medial:    5.33 cm/s LV PW:         1.30 cm  LV E/e' medial:  13.9 LV IVS:        0.80 cm  LV e' lateral:   5.00 cm/s LVOT diam:     2.10 cm  LV E/e' lateral: 14.8 LV SV:         93 LV SV Index:   53  2D Longitudinal Strain LVOT Area:     3.46 cm 2D Strain GLS Avg:     -16.6 %                          3D Volume EF:                         3D EF:        54 %                         LV EDV:       85 ml                         LV ESV:       39 ml                         LV SV:        46 ml RIGHT VENTRICLE RV Basal diam:  3.70 cm RV S prime:     21.60 cm/s TAPSE (M-mode): 3.2 cm LEFT ATRIUM             Index       RIGHT ATRIUM           Index LA diam:        3.50 cm 1.98 cm/m  RA Area:     21.30 cm LA Vol (A2C):   36.1 ml 20.38 ml/m RA Volume:   63.90 ml  36.07 ml/m LA Vol (A4C):   57.7 ml 32.57 ml/m LA Biplane Vol: 48.3 ml 27.26 ml/m  AORTIC VALVE                    PULMONIC VALVE AV Area (Vmax):    2.07 cm     RVOT Peak grad: 2 mmHg AV Area (Vmean):   1.91 cm AV Area (VTI):     2.11 cm AV Vmax:           215.33 cm/s AV Vmean:          156.667 cm/s AV VTI:            0.441 m AV Peak Grad:      18.5 mmHg AV Mean Grad:      11.0 mmHg LVOT Vmax:         129.00 cm/s LVOT Vmean:        86.600 cm/s LVOT VTI:          0.269 m LVOT/AV VTI ratio: 0.61  AORTA Ao Root diam: 3.20 cm MITRAL VALVE                TRICUSPID VALVE MV Area (PHT): 3.02 cm     TR Peak grad:   22.3 mmHg MV Decel Time: 251 msec     TR Vmax:        236.00 cm/s MV E velocity: 74.10 cm/s MV A velocity: 106.00 cm/s  SHUNTS MV E/A ratio:  0.70         Systemic VTI:  0.27 m                             Systemic Diam: 2.10 cm Julien Nordmann MD Electronically signed by Julien Nordmann MD Signature Date/Time: 12/09/2020/5:40:42 PM    Final  Subjective: Patient is  feeling better, no nausea or vomiting, dyspnea has been improving.   Discharge Exam: Vitals:   01/03/21 0753 01/03/21 0918  BP: (!) 107/56   Pulse: 94   Resp:    Temp: 97.9 F (36.6 C)   SpO2: (!) 85% (!) 45%   Vitals:   01/02/21 2340 01/03/21 0700 01/03/21 0753 01/03/21 0918  BP: (!) 98/54  (!) 107/56   Pulse: 79  94   Resp: 16     Temp: 98.6 F (37 C)  97.9 F (36.6 C)   TempSrc: Oral  Oral   SpO2: 95% 93% (!) 85% (!) 45%  Weight:      Height:        General: Not in pain or dyspnea  Neurology: Awake and alert, non focal  E ENT: no pallor, no icterus, oral mucosa moist Cardiovascular: No JVD. S1-S2 present, rhythmic, no gallops, rubs, or murmurs. No lower extremity edema. Pulmonary: positive breath sounds bilaterally, with no wheezing, no rhonchi, bilateral rales. Gastrointestinal. Abdomen soft and non tender Skin. No rashes Musculoskeletal: no joint deformities   The results of significant diagnostics from this hospitalization (including imaging, microbiology, ancillary and laboratory) are listed below for reference.     Microbiology: Recent Results (from the past 240 hour(s))  Culture, blood (routine x 2)     Status: None   Collection Time: 12/27/20  6:13 PM   Specimen: BLOOD  Result Value Ref Range Status   Specimen Description BLOOD RIGHT ANTECUBITAL  Final   Special Requests   Final    BOTTLES DRAWN AEROBIC AND ANAEROBIC Blood Culture adequate volume   Culture   Final    NO GROWTH 5 DAYS Performed at [facility], [address], [place], [place] [address]    Report Status 01/01/2021 FINAL  Final  Resp Panel by RT-PCR (Flu A&B, Covid) Nasopharyngeal Swab     Status: None   Collection Time: 12/27/20  7:00 PM   Specimen: Nasopharyngeal Swab; Nasopharyngeal(NP) swabs in vial transport medium  Result Value Ref Range Status   SARS Coronavirus 2 by RT PCR NEGATIVE NEGATIVE Final    Comment: (NOTE) SARS-CoV-2 target nucleic acids are NOT  DETECTED.  The SARS-CoV-2 RNA is generally detectable in upper respiratory specimens during the acute phase of infection. The lowest concentration of SARS-CoV-2 viral copies this assay can detect is 138 copies/mL. A negative result does not preclude SARS-Cov-2 infection and should not be used as the sole basis for treatment or other patient management decisions. A negative result may occur with  improper specimen collection/handling, submission of specimen other than nasopharyngeal swab, presence of viral mutation(s) within the areas targeted by this assay, and inadequate number of viral copies(<138 copies/mL). A negative result must be combined with clinical observations, patient history, and epidemiological information. The expected result is Negative.  Fact Sheet for Patients:  BloggerCourse.com  Fact Sheet for Healthcare Providers:  SeriousBroker.it  This test is no t yet approved or cleared by the [place] FDA and  has been authorized for detection and/or diagnosis of SARS-CoV-2 by FDA under an Emergency Use Authorization (EUA). This EUA will remain  in effect (meaning this test can be used) for the duration of the COVID-19 declaration under Section 564(b)(1) of the Act, 21 U.S.C.section 360bbb-3(b)(1), unless the authorization is terminated  or revoked sooner.       Influenza A by PCR NEGATIVE NEGATIVE Final   Influenza B by PCR NEGATIVE NEGATIVE Final  Comment: (NOTE) The Xpert Xpress SARS-CoV-2/FLU/RSV plus assay is intended as an aid in the diagnosis of influenza from Nasopharyngeal swab specimens and should not be used as a sole basis for treatment. Nasal washings and aspirates are unacceptable for Xpert Xpress SARS-CoV-2/FLU/RSV testing.  Fact Sheet for Patients: BloggerCourse.com  Fact Sheet for Healthcare Providers: SeriousBroker.it  This test is not yet  approved or cleared by the [place] FDA and has been authorized for detection and/or diagnosis of SARS-CoV-2 by FDA under an Emergency Use Authorization (EUA). This EUA will remain in effect (meaning this test can be used) for the duration of the COVID-19 declaration under Section 564(b)(1) of the Act, 21 U.S.C. section 360bbb-3(b)(1), unless the authorization is terminated or revoked.  Performed at [facility], [address], [place], [place] [address]   MRSA Next Gen by PCR, Nasal     Status: None   Collection Time: 12/28/20  9:52 PM   Specimen: Nasal Mucosa; Nasal Swab  Result Value Ref Range Status   MRSA by PCR Next Gen NOT DETECTED NOT DETECTED Final    Comment: (NOTE) The GeneXpert MRSA Assay (FDA approved for NASAL specimens only), is one component of a comprehensive MRSA colonization surveillance program. It is not intended to diagnose MRSA infection nor to guide or monitor treatment for MRSA infections. Test performance is not FDA approved in patients less than 48 years old. Performed at [facility], [address] [address] [place], [place] [address]   SARS CORONAVIRUS 2 (TAT 6-24 HRS) Nasopharyngeal Nasopharyngeal Swab     Status: None   Collection Time: 01/02/21  3:32 PM   Specimen: Nasopharyngeal Swab  Result Value Ref Range Status   SARS Coronavirus 2 NEGATIVE NEGATIVE Final    Comment: (NOTE) SARS-CoV-2 target nucleic acids are NOT DETECTED.  The SARS-CoV-2 RNA is generally detectable in upper and lower respiratory specimens during the acute phase of infection. Negative results do not preclude SARS-CoV-2 infection, do not rule out co-infections with other pathogens, and should not be used as the sole basis for treatment or other patient management decisions. Negative results must be combined with clinical observations, patient history, and epidemiological information. The expected result is Negative.  Fact Sheet for  Patients: HairSlick.no  Fact Sheet for Healthcare Providers: quierodirigir.com  This test is not yet approved or cleared by the [place] FDA and  has been authorized for detection and/or diagnosis of SARS-CoV-2 by FDA under an Emergency Use Authorization (EUA). This EUA will remain  in effect (meaning this test can be used) for the duration of the COVID-19 declaration under Se ction 564(b)(1) of the Act, 21 U.S.C. section 360bbb-3(b)(1), unless the authorization is terminated or revoked sooner.  Performed at [facility], [address], [place], [place] [address]      Labs: BNP (last 3 results) Recent Labs    11/03/20 1952 12/04/20 2330 12/27/20 1813  BNP 93.5 87.1 32.1   Basic Metabolic Panel: Recent Labs  Lab 12/29/20 0021 12/30/20 0023 12/31/20 0011 01/01/21 0034 01/02/21 0042 01/03/21 0050  NA 138 138 137 134* 135 135  K 4.3 4.3 4.0 3.9 3.9 3.9  CL 108 107 105 100 98 101  CO2 25 26 26 24 29 24   GLUCOSE 111* 145* 140* 241* 150* 144*  BUN 16 17 14 14 19 18   CREATININE 1.26* 1.46* 1.39* 1.42* 1.56* 1.47*  CALCIUM 8.6* 8.6* 8.8* 9.4 9.1 9.1  MG 1.8  --   --   --   --   --  PHOS  --   --   --  3.2  --   --    Liver Function Tests: Recent Labs  Lab 12/27/20 1813 01/01/21 0034  AST 24  --   ALT 8  --   ALKPHOS 44  --   BILITOT 0.4  --   PROT 5.8*  --   ALBUMIN 2.8* 2.7*   No results for input(s): LIPASE, AMYLASE in the last 168 hours. No results for input(s): AMMONIA in the last 168 hours. CBC: Recent Labs  Lab 12/27/20 1813 12/28/20 0400 12/28/20 1120 12/28/20 2000 12/29/20 0021 12/31/20 0011 12/31/20 2245 01/01/21 0034  WBC 9.0 8.7  --   --  8.8  --   --   --   NEUTROABS 5.7  --   --   --   --   --   --   --   HGB 8.3* 7.9*   < > 7.8* 7.6* 7.0* 10.8* 11.0*  HCT 28.0* 26.9*   < > 26.0* 25.0* 23.0* 34.1* 35.1*  MCV 96.9 97.1  --   --  94.7  --   --   --   PLT 273 200  --   --   213  --   --   --    < > = values in this interval not displayed.   Cardiac Enzymes: No results for input(s): CKTOTAL, CKMB, CKMBINDEX, TROPONINI in the last 168 hours. BNP: Invalid input(s): POCBNP CBG: Recent Labs  Lab 01/02/21 0839 01/02/21 1120 01/02/21 1626 01/02/21 2146 01/03/21 0638  GLUCAP 244* 72 155* 162* 151*   D-Dimer No results for input(s): DDIMER in the last 72 hours. Hgb A1c No results for input(s): HGBA1C in the last 72 hours. Lipid Profile No results for input(s): CHOL, HDL, LDLCALC, TRIG, CHOLHDL, LDLDIRECT in the last 72 hours. Thyroid function studies No results for input(s): TSH, T4TOTAL, T3FREE, THYROIDAB in the last 72 hours.  Invalid input(s): FREET3 Anemia work up No results for input(s): VITAMINB12, FOLATE, FERRITIN, TIBC, IRON, RETICCTPCT in the last 72 hours. Urinalysis    Component Value Date/Time   COLORURINE YELLOW 12/27/2020 2005   APPEARANCEUR CLEAR 12/27/2020 2005   LABSPEC 1.010 12/27/2020 2005   PHURINE 6.0 12/27/2020 2005   GLUCOSEU NEGATIVE 12/27/2020 2005   HGBUR NEGATIVE 12/27/2020 2005   BILIRUBINUR NEGATIVE 12/27/2020 2005   KETONESUR NEGATIVE 12/27/2020 2005   PROTEINUR NEGATIVE 12/27/2020 2005   NITRITE NEGATIVE 12/27/2020 2005   LEUKOCYTESUR MODERATE (A) 12/27/2020 2005   Sepsis Labs Invalid input(s): PROCALCITONIN,  WBC,  LACTICIDVEN Microbiology Recent Results (from the past 240 hour(s))  Culture, blood (routine x 2)     Status: None   Collection Time: 12/27/20  6:13 PM   Specimen: BLOOD  Result Value Ref Range Status   Specimen Description BLOOD RIGHT ANTECUBITAL  Final   Special Requests   Final    BOTTLES DRAWN AEROBIC AND ANAEROBIC Blood Culture adequate volume   Culture   Final    NO GROWTH 5 DAYS Performed at [facility], [address], [place], [place] [address]    Report Status 01/01/2021 FINAL  Final  Resp Panel by RT-PCR (Flu A&B, Covid) Nasopharyngeal Swab     Status: None   Collection  Time: 12/27/20  7:00 PM   Specimen: Nasopharyngeal Swab; Nasopharyngeal(NP) swabs in vial transport medium  Result Value Ref Range Status   SARS Coronavirus 2 by RT PCR NEGATIVE NEGATIVE Final    Comment: (NOTE) SARS-CoV-2 target nucleic  acids are NOT DETECTED.  The SARS-CoV-2 RNA is generally detectable in upper respiratory specimens during the acute phase of infection. The lowest concentration of SARS-CoV-2 viral copies this assay can detect is 138 copies/mL. A negative result does not preclude SARS-Cov-2 infection and should not be used as the sole basis for treatment or other patient management decisions. A negative result may occur with  improper specimen collection/handling, submission of specimen other than nasopharyngeal swab, presence of viral mutation(s) within the areas targeted by this assay, and inadequate number of viral copies(<138 copies/mL). A negative result must be combined with clinical observations, patient history, and epidemiological information. The expected result is Negative.  Fact Sheet for Patients:  BloggerCourse.com  Fact Sheet for Healthcare Providers:  SeriousBroker.it  This test is no t yet approved or cleared by the [place] FDA and  has been authorized for detection and/or diagnosis of SARS-CoV-2 by FDA under an Emergency Use Authorization (EUA). This EUA will remain  in effect (meaning this test can be used) for the duration of the COVID-19 declaration under Section 564(b)(1) of the Act, 21 U.S.C.section 360bbb-3(b)(1), unless the authorization is terminated  or revoked sooner.       Influenza A by PCR NEGATIVE NEGATIVE Final   Influenza B by PCR NEGATIVE NEGATIVE Final    Comment: (NOTE) The Xpert Xpress SARS-CoV-2/FLU/RSV plus assay is intended as an aid in the diagnosis of influenza from Nasopharyngeal swab specimens and should not be used as a sole basis for treatment. Nasal washings  and aspirates are unacceptable for Xpert Xpress SARS-CoV-2/FLU/RSV testing.  Fact Sheet for Patients: BloggerCourse.com  Fact Sheet for Healthcare Providers: SeriousBroker.it  This test is not yet approved or cleared by the [place] FDA and has been authorized for detection and/or diagnosis of SARS-CoV-2 by FDA under an Emergency Use Authorization (EUA). This EUA will remain in effect (meaning this test can be used) for the duration of the COVID-19 declaration under Section 564(b)(1) of the Act, 21 U.S.C. section 360bbb-3(b)(1), unless the authorization is terminated or revoked.  Performed at [facility], [address], [place], [place] [address]   MRSA Next Gen by PCR, Nasal     Status: None   Collection Time: 12/28/20  9:52 PM   Specimen: Nasal Mucosa; Nasal Swab  Result Value Ref Range Status   MRSA by PCR Next Gen NOT DETECTED NOT DETECTED Final    Comment: (NOTE) The GeneXpert MRSA Assay (FDA approved for NASAL specimens only), is one component of a comprehensive MRSA colonization surveillance program. It is not intended to diagnose MRSA infection nor to guide or monitor treatment for MRSA infections. Test performance is not FDA approved in patients less than 72 years old. Performed at [facility], [address] [place], [place] [address]   SARS CORONAVIRUS 2 (TAT 6-24 HRS) Nasopharyngeal Nasopharyngeal Swab     Status: None   Collection Time: 01/02/21  3:32 PM   Specimen: Nasopharyngeal Swab  Result Value Ref Range Status   SARS Coronavirus 2 NEGATIVE NEGATIVE Final    Comment: (NOTE) SARS-CoV-2 target nucleic acids are NOT DETECTED.  The SARS-CoV-2 RNA is generally detectable in upper and lower respiratory specimens during the acute phase of infection. Negative results do not preclude SARS-CoV-2 infection, do not rule out co-infections with other pathogens, and should not be used as the sole  basis for treatment or other patient management decisions. Negative results must be combined with clinical observations, patient history, and epidemiological information.  The expected result is Negative.  Fact Sheet for Patients: HairSlick.no  Fact Sheet for Healthcare Providers: quierodirigir.com  This test is not yet approved or cleared by the [place] FDA and  has been authorized for detection and/or diagnosis of SARS-CoV-2 by FDA under an Emergency Use Authorization (EUA). This EUA will remain  in effect (meaning this test can be used) for the duration of the COVID-19 declaration under Se ction 564(b)(1) of the Act, 21 U.S.C. section 360bbb-3(b)(1), unless the authorization is terminated or revoked sooner.  Performed at [facility], [address], [place], [place] [address]      Time coordinating discharge: 45 minutes  SIGNED:   Coralie Keens, MD  [facility] 01/03/2021, 9:32 AM

## 2021-01-03 NOTE — TOC Transition Note (Signed)
Transition of Care [facility]) - CM/SW Discharge Note   Patient Details  Name: Sierra Guzman MRN: [id number] Date of Birth: [birth year]  [facility]) CM/SW Contact:  Lynett Grimes Phone Number: 01/03/2021, 2:53 PM   Clinical Narrative:    Patient will DC to: [facility] Anticipated DC date: 01/03/2021 Family notified: Pt Daughter Transport by: Sharin Mons   Per MD patient ready for DC to [facility] room 109. RN to call report prior to discharge [phone]). RN, patient, patient's family, and facility notified of DC. Discharge Summary and FL2 sent to facility. DC packet on chart. Ambulance transport requested for patient.   CSW will sign off for now as social work intervention is no longer needed. Please consult [place] again if new needs arise.           Patient Goals and CMS Choice        Discharge Placement                       Discharge Plan and Services                                     Social Determinants of Health (SDOH) Interventions     Readmission Risk Interventions No flowsheet data found.

## 2021-01-03 NOTE — NC FL2 (Signed)
[place] MEDICAID FL2 LEVEL OF CARE SCREENING TOOL     IDENTIFICATION  Patient Name: Sierra Guzman Birthdate: May 20, 1940 Sex: female Admission Date (Current Location): 12/27/2020  [facility] and [place] Number:  Producer, television/film/video and Address:  [facility], [address] [place], [place] [address]      Provider Number: [id number]  Attending Physician Name and Address:  Coralie Keens  Relative Name and Phone Number:       Current Level of Care: Hospital Recommended Level of Care: Skilled Nursing Facility Prior Approval Number:    Date Approved/Denied:   PASRR Number: [id number]  Discharge Plan: SNF    Current Diagnoses: Patient Active Problem List   Diagnosis Date Noted   CKD (chronic kidney disease) stage 3, GFR 30-59 ml/min (HCC) 01/01/2021   ARF (acute renal failure) (HCC) 12/28/2020   Chronic diastolic CHF (congestive heart failure) (HCC) 12/27/2020   AKI (acute kidney injury) (HCC) 12/27/2020   IPF (idiopathic pulmonary fibrosis) (HCC) 12/27/2020   Sacral decubitus ulcer 12/27/2020   Occult blood positive stool 12/27/2020   DDD (degenerative disc disease), cervical 12/21/2020   Hereditary and idiopathic peripheral neuropathy 12/21/2020   Localized, primary osteoarthritis 12/21/2020   Low back pain 12/21/2020   Pain in limb 12/21/2020   Chronic respiratory failure with hypoxia (HCC) 12/05/2020   Acute respiratory failure with hypoxia (HCC) 12/05/2020   Hip pain 08/06/2020   Spinal stenosis in cervical region 08/06/2020   Spondylolisthesis, congenital 08/06/2020   Status post total hip replacement, 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   Primary localized osteoarthritis of right hip 12/12/2016   B12 deficiency 10/28/2010   Coronary artery disease 10/28/2010   Diabetes type 2, controlled (HCC) 10/28/2010   Gout 10/28/2010   Hyperlipidemia with target LDL less than 100 10/28/2010   Hypertension 10/28/2010   Osteoarthritis of left knee  10/28/2010   Anemia 10/28/2010   History of colonic polyps 10/28/2010    Orientation RESPIRATION BLADDER Height & Weight     Self, Time, Situation, Place  O2 (3L Nasal Cannula)   Weight: 161 lb 6 oz (73.2 kg) Height:  5\' 7"  (170.2 cm)  BEHAVIORAL SYMPTOMS/MOOD NEUROLOGICAL BOWEL NUTRITION STATUS      Continent Diet (See DC Summary)  AMBULATORY STATUS COMMUNICATION OF NEEDS Skin   Limited Assist Verbally Normal                       Personal Care Assistance Level of Assistance              Functional Limitations Info  Sight, Hearing, Speech Sight Info: Impaired Hearing Info: Adequate Speech Info: Adequate    SPECIAL CARE FACTORS FREQUENCY  PT (By licensed PT), OT (By licensed OT)     PT Frequency: 5x a wee OT Frequency: 5x a week            Contractures Contractures Info: Not present    Additional Factors Info  Code Status, Allergies Code Status Info: Full Allergies Info: Morphine And Related           Current Medications (01/03/2021):  This is the current hospital active medication list Current Facility-Administered Medications  Medication Dose Route Frequency Provider Last Rate Last Admin   0.9 %  sodium chloride infusion (Manually program via Guardrails IV Fluids)   Intravenous Once Arrien, 01/05/2021, MD       acetaminophen (TYLENOL) tablet 650 mg  650 mg Oral Q6H PRN York Ram, DO  Or   acetaminophen (TYLENOL) suppository 650 mg  650 mg Rectal Q6H PRN Hillary Bow, DO       albuterol (PROVENTIL) (2.5 MG/3ML) 0.083% nebulizer solution 2.5 mg  2.5 mg Inhalation Q6H PRN Hillary Bow, DO       allopurinol (ZYLOPRIM) tablet 300 mg  300 mg Oral Daily Lyda Perone M, DO   300 mg at 01/03/21 9381   docusate sodium (COLACE) capsule 100 mg  100 mg Oral BID Hillary Bow, DO   100 mg at 01/02/21 0953   enoxaparin (LOVENOX) injection 30 mg  30 mg Subcutaneous Q24H Coralie Keens, MD   30 mg at 01/03/21 0929   famotidine  (PEPCID) tablet 20 mg  20 mg Oral Daily Arrien, York Ram, MD   20 mg at 01/03/21 8299   gabapentin (NEURONTIN) capsule 600 mg  600 mg Oral BID Benjiman Core, MD   600 mg at 01/03/21 3716   gabapentin (NEURONTIN) capsule 900 mg  900 mg Oral QHS Hillary Bow, DO   900 mg at 01/02/21 2151   Gerhardt's butt cream   Topical TID [place], Eric J, DO   Given at 01/03/21 9678   insulin aspart (novoLOG) injection 0-9 Units  0-9 Units Subcutaneous TID WC Hillary Bow, DO   2 Units at 01/03/21 0703   mometasone-formoterol (DULERA) 200-5 MCG/ACT inhaler 2 puff  2 puff Inhalation BID Hillary Bow, DO   2 puff at 01/03/21 0828   ondansetron (ZOFRAN) tablet 4 mg  4 mg Oral Q6H PRN Hillary Bow, DO       Or   ondansetron [facility]) injection 4 mg  4 mg Intravenous Q6H PRN Hillary Bow, DO       polyethylene glycol (MIRALAX / GLYCOLAX) packet 17 g  17 g Oral Daily Lyda Perone M, DO   17 g at 01/02/21 0954   rosuvastatin (CRESTOR) tablet 40 mg  40 mg Oral Daily Lyda Perone M, DO   40 mg at 01/03/21 9381   umeclidinium bromide (INCRUSE ELLIPTA) 62.5 MCG/INH 1 puff  1 puff Inhalation Daily Hillary Bow, DO   1 puff at 01/03/21 0175     Discharge Medications: Please see discharge summary for a list of discharge medications.  Relevant Imaging Results:  Relevant Lab Results:   Additional Information SSN: [id number]  Ivette Loyal, [place]

## 2021-01-03 NOTE — TOC Progression Note (Signed)
Transition of Care [facility]) - Progression Note    Patient Details  Name: Sierra Guzman MRN: [id number] Date of Birth: 1941/03/09  [facility]) CM/SW Contact  Ivette Loyal, [place] Phone Number: 01/03/2021, 1:09 PM  Clinical Narrative:    CSW contacted pt to confirm SNF DC, pt confirmed that she does want to return to [facility] at DC.  Csw contacted [facility] to confirm auth and pt return to the facility to day. There was no answer CSW left a message.        Expected Discharge Plan and Services           Expected Discharge Date: 01/03/21                                     Social Determinants of Health (SDOH) Interventions    Readmission Risk Interventions No flowsheet data found.

## 2021-01-11 ENCOUNTER — Other Ambulatory Visit: Payer: Self-pay

## 2021-01-11 ENCOUNTER — Emergency Department (HOSPITAL_COMMUNITY): Payer: Medicare HMO

## 2021-01-11 ENCOUNTER — Encounter (HOSPITAL_COMMUNITY): Payer: Self-pay | Admitting: Emergency Medicine

## 2021-01-11 ENCOUNTER — Inpatient Hospital Stay (HOSPITAL_COMMUNITY)
Admission: EM | Admit: 2021-01-11 | Discharge: 2021-01-17 | DRG: 280 | Disposition: A | Discharge status: Home Health | Payer: Medicare HMO | Attending: Internal Medicine | Admitting: Internal Medicine

## 2021-01-11 DIAGNOSIS — Z87891 Personal history of nicotine dependence: Secondary | ICD-10-CM

## 2021-01-11 DIAGNOSIS — Z7982 Long term (current) use of aspirin: Secondary | ICD-10-CM

## 2021-01-11 DIAGNOSIS — I5032 Chronic diastolic (congestive) heart failure: Secondary | ICD-10-CM | POA: Diagnosis present

## 2021-01-11 DIAGNOSIS — Z7984 Long term (current) use of oral hypoglycemic drugs: Secondary | ICD-10-CM

## 2021-01-11 DIAGNOSIS — Z96652 Presence of left artificial knee joint: Secondary | ICD-10-CM | POA: Diagnosis present

## 2021-01-11 DIAGNOSIS — E559 Vitamin D deficiency, unspecified: Secondary | ICD-10-CM | POA: Diagnosis present

## 2021-01-11 DIAGNOSIS — Z79899 Other long term (current) drug therapy: Secondary | ICD-10-CM

## 2021-01-11 DIAGNOSIS — J9601 Acute respiratory failure with hypoxia: Secondary | ICD-10-CM | POA: Diagnosis not present

## 2021-01-11 DIAGNOSIS — I25110 Atherosclerotic heart disease of native coronary artery with unstable angina pectoris: Secondary | ICD-10-CM | POA: Diagnosis present

## 2021-01-11 DIAGNOSIS — M5136 Other intervertebral disc degeneration, lumbar region: Secondary | ICD-10-CM | POA: Diagnosis present

## 2021-01-11 DIAGNOSIS — M1990 Unspecified osteoarthritis, unspecified site: Secondary | ICD-10-CM | POA: Diagnosis present

## 2021-01-11 DIAGNOSIS — E871 Hypo-osmolality and hyponatremia: Secondary | ICD-10-CM | POA: Diagnosis present

## 2021-01-11 DIAGNOSIS — Z803 Family history of malignant neoplasm of breast: Secondary | ICD-10-CM

## 2021-01-11 DIAGNOSIS — I130 Hypertensive heart and chronic kidney disease with heart failure and stage 1 through stage 4 chronic kidney disease, or unspecified chronic kidney disease: Secondary | ICD-10-CM | POA: Diagnosis not present

## 2021-01-11 DIAGNOSIS — J84112 Idiopathic pulmonary fibrosis: Secondary | ICD-10-CM | POA: Diagnosis present

## 2021-01-11 DIAGNOSIS — T380X5A Adverse effect of glucocorticoids and synthetic analogues, initial encounter: Secondary | ICD-10-CM | POA: Diagnosis not present

## 2021-01-11 DIAGNOSIS — E538 Deficiency of other specified B group vitamins: Secondary | ICD-10-CM | POA: Diagnosis present

## 2021-01-11 DIAGNOSIS — Z9981 Dependence on supplemental oxygen: Secondary | ICD-10-CM

## 2021-01-11 DIAGNOSIS — L8990 Pressure ulcer of unspecified site, unspecified stage: Secondary | ICD-10-CM | POA: Insufficient documentation

## 2021-01-11 DIAGNOSIS — D631 Anemia in chronic kidney disease: Secondary | ICD-10-CM | POA: Diagnosis present

## 2021-01-11 DIAGNOSIS — I2510 Atherosclerotic heart disease of native coronary artery without angina pectoris: Secondary | ICD-10-CM | POA: Diagnosis present

## 2021-01-11 DIAGNOSIS — Z96643 Presence of artificial hip joint, bilateral: Secondary | ICD-10-CM | POA: Diagnosis present

## 2021-01-11 DIAGNOSIS — J9621 Acute and chronic respiratory failure with hypoxia: Secondary | ICD-10-CM | POA: Diagnosis present

## 2021-01-11 DIAGNOSIS — J9611 Chronic respiratory failure with hypoxia: Secondary | ICD-10-CM | POA: Diagnosis present

## 2021-01-11 DIAGNOSIS — I5043 Acute on chronic combined systolic (congestive) and diastolic (congestive) heart failure: Secondary | ICD-10-CM | POA: Diagnosis present

## 2021-01-11 DIAGNOSIS — E785 Hyperlipidemia, unspecified: Secondary | ICD-10-CM | POA: Diagnosis present

## 2021-01-11 DIAGNOSIS — L89152 Pressure ulcer of sacral region, stage 2: Secondary | ICD-10-CM | POA: Diagnosis present

## 2021-01-11 DIAGNOSIS — J189 Pneumonia, unspecified organism: Secondary | ICD-10-CM | POA: Diagnosis not present

## 2021-01-11 DIAGNOSIS — K219 Gastro-esophageal reflux disease without esophagitis: Secondary | ICD-10-CM | POA: Diagnosis present

## 2021-01-11 DIAGNOSIS — E1122 Type 2 diabetes mellitus with diabetic chronic kidney disease: Secondary | ICD-10-CM | POA: Diagnosis present

## 2021-01-11 DIAGNOSIS — E669 Obesity, unspecified: Secondary | ICD-10-CM | POA: Diagnosis present

## 2021-01-11 DIAGNOSIS — E1165 Type 2 diabetes mellitus with hyperglycemia: Secondary | ICD-10-CM | POA: Diagnosis present

## 2021-01-11 DIAGNOSIS — R54 Age-related physical debility: Secondary | ICD-10-CM | POA: Diagnosis present

## 2021-01-11 DIAGNOSIS — Z885 Allergy status to narcotic agent status: Secondary | ICD-10-CM

## 2021-01-11 DIAGNOSIS — Z8719 Personal history of other diseases of the digestive system: Secondary | ICD-10-CM

## 2021-01-11 DIAGNOSIS — N1831 Chronic kidney disease, stage 3a: Secondary | ICD-10-CM | POA: Diagnosis present

## 2021-01-11 DIAGNOSIS — Z20822 Contact with and (suspected) exposure to covid-19: Secondary | ICD-10-CM | POA: Diagnosis present

## 2021-01-11 DIAGNOSIS — E119 Type 2 diabetes mellitus without complications: Secondary | ICD-10-CM

## 2021-01-11 DIAGNOSIS — N1832 Chronic kidney disease, stage 3b: Secondary | ICD-10-CM | POA: Diagnosis present

## 2021-01-11 DIAGNOSIS — N183 Chronic kidney disease, stage 3 unspecified: Secondary | ICD-10-CM | POA: Diagnosis present

## 2021-01-11 DIAGNOSIS — N179 Acute kidney failure, unspecified: Secondary | ICD-10-CM | POA: Diagnosis present

## 2021-01-11 DIAGNOSIS — M109 Gout, unspecified: Secondary | ICD-10-CM | POA: Diagnosis present

## 2021-01-11 DIAGNOSIS — I21A1 Myocardial infarction type 2: Secondary | ICD-10-CM | POA: Diagnosis present

## 2021-01-11 DIAGNOSIS — Z833 Family history of diabetes mellitus: Secondary | ICD-10-CM

## 2021-01-11 DIAGNOSIS — J440 Chronic obstructive pulmonary disease with acute lower respiratory infection: Secondary | ICD-10-CM | POA: Diagnosis not present

## 2021-01-11 DIAGNOSIS — I10 Essential (primary) hypertension: Secondary | ICD-10-CM | POA: Diagnosis present

## 2021-01-11 DIAGNOSIS — E876 Hypokalemia: Secondary | ICD-10-CM | POA: Diagnosis not present

## 2021-01-11 DIAGNOSIS — Z823 Family history of stroke: Secondary | ICD-10-CM

## 2021-01-11 MED ORDER — SODIUM CHLORIDE 0.9 % IV SOLN
500.0000 mg | INTRAVENOUS | Status: AC
  Administered 2021-01-12 – 2021-01-16 (×6): 500 mg via INTRAVENOUS
  Filled 2021-01-11 (×6): qty 500

## 2021-01-11 MED ORDER — SODIUM CHLORIDE 0.9 % IV SOLN
2.0000 g | Freq: Once | INTRAVENOUS | Status: AC
  Administered 2021-01-12: 2 g via INTRAVENOUS
  Filled 2021-01-11: qty 20

## 2021-01-11 NOTE — ED Triage Notes (Signed)
Pt arrived via EMS from [facility]. Pt has COPD and CHF and is normally 2L of oxygen at baseline. Pt became more short of breath than usual per [facility] staff, and her oxygen level was 79 for them. EMS sat the pt up and it went to 86. EMS placed pt on 6L of oxygen and her SpO2 went to 94.

## 2021-01-11 NOTE — ED Provider Notes (Addendum)
[facility]ERGENCY DEPT [facility] Emergency Department Provider Note MRN:  [id number]  Arrival date & time: 01/12/21     Chief Complaint   Shortness of Breath   History of Present Illness   Sierra Guzman is a 80 y.o. year-old female with a history of pulmonary fibrosis, CHF presenting to the ED with chief complaint of shortness of breath.  Location: Chest Duration: 3 to 4 hours Onset: Gradual Timing: Constant Description: Cannot catch her breath Severity: Moderate to severe Exacerbating/Alleviating Factors: None Associated Symptoms: Single episode of diarrhea Pertinent Negatives: No nausea vomiting, no chest pain, no abdominal pain, no recent fever or cough  Additional History: Was told to stop taking her pulmonary fibrosis medicine  Review of Systems  A complete 10 system review of systems was obtained and all systems are negative except as noted in the HPI and PMH.   Patient's Health History    Past Medical History:  Diagnosis Date   Anemia 10/28/2010   unspecified   Anxiety    Aortic valve sclerosis    Arthritis    Asthma    Chronic diastolic CHF (congestive heart failure) (HCC)    Echo 12/2020 = nl EF, Grade 1 DD, normal RV function, no mention of pulm art pressure though   Chronic kidney disease    Chronic Kidney Disease---Stage III   Coronary artery disease 10/28/2010   DDD (degenerative disc disease), lumbar 2006   Diabetes type 2, controlled (HCC) 10/28/2010   Dyspnea    GERD (gastroesophageal reflux disease)    Gout 10/28/2010   Heart murmur    History of cardiac catheterization 2006   LAD 50%, D1 50% rest normal.    History of cardiovascular stress test 09/23/2009    lexiscan Normal Cardiolite test, EF 61%. LV size and function - normal   History of echocardiogram 05/27/2008   EF 55%; Mild aortic insufficiency with minimal aortic sclerosis. L vent normal.    Hyperlipidemia    Hyperlipidemia LDL goal <100 10/28/2010   managed by Dr. Burr Medico    Hypertension 10/28/2010   IPF (idiopathic pulmonary fibrosis) (HCC)    Progressive, with UIP   Mild vitamin D deficiency 02/22/2011   Obesity (BMI 30-39.9) 10/28/2010   unspecified   Osteoarthritis 10/28/2010   bilateral knees, left hip   Personal history of colonic polyps 10/28/2010   Shortness of breath    Syncope 2013   Vitamin B 12 deficiency 10/28/2010    Past Surgical History:  Procedure Laterality Date   BACK SURGERY  2006   DDD   BREAST BIOPSY Right    neg   CARDIAC CATHETERIZATION  2006   EYE SURGERY Bilateral    Cataract Extraction with IOL   JOINT REPLACEMENT Left    Left Total Hip Replacement   JOINT REPLACEMENT     RIGHT 2005; LEFT 2 X 2004, 2011   REPLACEMENT TOTAL KNEE Left    REVISION TOTAL KNEE ARTHROPLASTY Left    Dr. Ernest Pine   TONSILLECTOMY     TOTAL HIP ARTHROPLASTY Right 12/12/2016   Procedure: TOTAL HIP ARTHROPLASTY ANTERIOR APPROACH;  Surgeon: Kennedy Bucker, MD;  Location: [facility] ORS;  Service: Orthopedics;  Laterality: Right;   TOTAL HIP ARTHROPLASTY Left 12/26/2013   Procedure: ARTHROPLASTY HIP TOTAL; Surgeon: Christell Faith, MD; Location: [facility] OR; Service: Orthopedics; Laterality: left,    TOTAL KNEE ARTHROPLASTY     both knees    Family History  Problem Relation Age of Onset   Breast cancer Mother 38  Diabetes Mother    Stroke Mother    Stroke Brother    Breast cancer Cousin        pat cousin    Social History   Socioeconomic History   Marital status: Married    Spouse name: Not on file   Number of children: Not on file   Years of education: Not on file   Highest education level: Not on file  Occupational History   Not on file  Tobacco Use   Smoking status: Former    Packs/day: 0.25    Years: 10.00    Pack years: 2.50    Types: Cigarettes    Quit date: 08/18/1990    Years since quitting: 30.4   Smokeless tobacco: Never  Vaping Use   Vaping Use: Never used  Substance and Sexual Activity   Alcohol use: No   Drug use: No    Sexual activity: Not Currently  Other Topics Concern   Not on file  Social History Narrative   Not on file   Social Determinants of Health   Financial Resource Strain: Not on file  Food Insecurity: Not on file  Transportation Needs: Not on file  Physical Activity: Not on file  Stress: Not on file  Social Connections: Not on file  Intimate Partner Violence: Not on file     Physical Exam   Vitals:   01/12/21 0055 01/12/21 0130  BP: 96/65 101/69  Pulse: (!) 102 (!) 104  Resp: (!) 26 (!) 35  Temp:    SpO2: 97% 100%    CONSTITUTIONAL: Well-appearing, NAD NEURO:  Alert and oriented x 3, no focal deficits EYES:  eyes equal and reactive ENT/NECK:  no LAD, no JVD CARDIO: Regular rate, well-perfused, normal S1 and S2 PULM: Tachypneic, coarse breath sounds GI/GU:  normal bowel sounds, non-distended, non-tender MSK/SPINE:  No gross deformities, no edema SKIN:  no rash, atraumatic PSYCH:  Appropriate speech and behavior  *Additional and/or pertinent findings included in MDM below  Diagnostic and Interventional Summary    EKG Interpretation  Date/Time:  Wednesday January 12 2021 00:24:44 EDT Ventricular Rate:  106 PR Interval:  152 QRS Duration: 97 QT Interval:  358 QTC Calculation: 455 R Axis:   -13 Text Interpretation: Sinus tachycardia Multiple premature complexes, vent & supraven Nonspecific T abnormalities, diffuse leads Confirmed by Kennis Carina [phone]) on 01/12/2021 12:38:48 AM       Labs Reviewed  CBC - Abnormal; Notable for the following components:      Result Value   WBC 11.7 (*)    RBC 3.45 (*)    Hemoglobin 9.9 (*)    HCT 32.0 (*)    RDW 18.8 (*)    All other components within normal limits  BASIC METABOLIC PANEL - Abnormal; Notable for the following components:   Sodium 132 (*)    CO2 19 (*)    Glucose, Bld 152 (*)    BUN 24 (*)    Creatinine, Ser 1.76 (*)    Calcium 8.7 (*)    GFR, Estimated 29 (*)    All other components within normal  limits  BRAIN NATRIURETIC PEPTIDE - Abnormal; Notable for the following components:   B Natriuretic Peptide 1,114.8 (*)    All other components within normal limits  BLOOD GAS, VENOUS - Abnormal; Notable for the following components:   pCO2, Ven 36.0 (*)    Bicarbonate 19.7 (*)    Acid-base deficit 4.7 (*)    All other components within normal limits  D-DIMER, QUANTITATIVE - Abnormal; Notable for the following components:   D-Dimer, Quant 4.44 (*)    All other components within normal limits  TROPONIN I (HIGH SENSITIVITY) - Abnormal; Notable for the following components:   Troponin I (High Sensitivity) 466 (*)    All other components within normal limits  RESP PANEL BY RT-PCR (FLU A&B, COVID) ARPGX2  APTT  PROTIME-INR  HEPARIN LEVEL (UNFRACTIONATED)  TROPONIN I (HIGH SENSITIVITY)    DG Chest Port 1 View  Final Result    CT Angio Chest Pulmonary Embolism (PE) W or WO Contrast    (Results Pending)    Medications  cefTRIAXone (ROCEPHIN) 2 g in sodium chloride 0.9 % 100 mL IVPB (has no administration in time range)  azithromycin (ZITHROMAX) 500 mg in sodium chloride 0.9 % 250 mL IVPB (0 mg Intravenous Stopped 01/12/21 0148)  heparin bolus via infusion 2,000 Units (has no administration in time range)  heparin ADULT infusion 100 units/mL (25000 units/230mL) (has no administration in time range)  fentaNYL (SUBLIMAZE) injection 25 mcg (has no administration in time range)     Procedures  /  Critical Care .Critical Care Performed by: Sabas Sous, MD Authorized by: Sabas Sous, MD   Critical care provider statement:    Critical care time (minutes):  35   Critical care was necessary to treat or prevent imminent or life-threatening deterioration of the following conditions:  Respiratory failure   Critical care was time spent personally by me on the following activities:  Discussions with consultants, evaluation of patient's response to treatment, examination of patient, ordering  and performing treatments and interventions, ordering and review of laboratory studies, ordering and review of radiographic studies, pulse oximetry, re-evaluation of patient's condition, obtaining history from patient or surrogate and review of old charts  ED Course and Medical Decision Making  I have reviewed the triage vital signs, the nursing notes, and pertinent available records from the EMR.  Listed above are laboratory and imaging tests that I personally ordered, reviewed, and interpreted and then considered in my medical decision making (see below for details).  Differential diagnosis includes CHF exacerbation, flare of pulmonary fibrosis, pneumonia, pneumothorax.  Awaiting chest x-ray, labs.  Some increased edema to the lower extremities.  PE felt to be unlikely.     Labs reveal significantly elevated D-dimer, troponin elevation, D-dimer elevation.  All of this raising concern for possible PE versus strain in the setting of hypoxia, possibly due to CHF exacerbation or flare of pulmonary fibrosis.  Patient was placed on BiPAP to attempt improvement in her respiratory effort.  She tolerated the BiPAP for 1-1/2 hours but it did not provide much relief and she asked that it be taken off.  She continues to breathe 35 times a minute.  Concerned that she is going to tire out eventually, will consult intensivist for admission.  Empirically starting heparin.  Also providing Coverage.  Had a conversation with the patient about the possibility of a pulmonary embolism.  Unfortunately VQ scan would likely be unhelpful given her chronic lung abnormalities.  Seems that CTA is necessary at this time and the benefits outweigh the risks.  Patient made aware of the small risk of worsening kidney function, she agrees to undergo CT imaging with contrast.  Elmer Sow. Pilar Plate, MD [facility] Emergency Medicine [facility] [email or web address]  Final Clinical Impressions(s) / ED Diagnoses      ICD-10-CM   1. Acute respiratory failure with hypoxia (HCC)  J96.01  ED Discharge Orders     None        Discharge Instructions Discussed with and Provided to Patient:   Discharge Instructions   None       Sabas Sous, MD 01/12/21 0200    Sabas Sous, MD 01/12/21 [phone]

## 2021-01-12 ENCOUNTER — Encounter (HOSPITAL_COMMUNITY): Payer: Self-pay

## 2021-01-12 ENCOUNTER — Emergency Department (HOSPITAL_COMMUNITY): Payer: Medicare HMO

## 2021-01-12 ENCOUNTER — Inpatient Hospital Stay (HOSPITAL_COMMUNITY): Payer: Medicare HMO

## 2021-01-12 DIAGNOSIS — J9611 Chronic respiratory failure with hypoxia: Secondary | ICD-10-CM | POA: Diagnosis not present

## 2021-01-12 DIAGNOSIS — E119 Type 2 diabetes mellitus without complications: Secondary | ICD-10-CM | POA: Diagnosis not present

## 2021-01-12 DIAGNOSIS — N1832 Chronic kidney disease, stage 3b: Secondary | ICD-10-CM | POA: Diagnosis present

## 2021-01-12 DIAGNOSIS — E876 Hypokalemia: Secondary | ICD-10-CM | POA: Diagnosis not present

## 2021-01-12 DIAGNOSIS — J9601 Acute respiratory failure with hypoxia: Secondary | ICD-10-CM

## 2021-01-12 DIAGNOSIS — M109 Gout, unspecified: Secondary | ICD-10-CM | POA: Diagnosis present

## 2021-01-12 DIAGNOSIS — N183 Chronic kidney disease, stage 3 unspecified: Secondary | ICD-10-CM

## 2021-01-12 DIAGNOSIS — I5032 Chronic diastolic (congestive) heart failure: Secondary | ICD-10-CM | POA: Diagnosis not present

## 2021-01-12 DIAGNOSIS — E871 Hypo-osmolality and hyponatremia: Secondary | ICD-10-CM | POA: Diagnosis present

## 2021-01-12 DIAGNOSIS — J189 Pneumonia, unspecified organism: Secondary | ICD-10-CM | POA: Diagnosis not present

## 2021-01-12 DIAGNOSIS — I21A1 Myocardial infarction type 2: Secondary | ICD-10-CM | POA: Diagnosis present

## 2021-01-12 DIAGNOSIS — J9620 Acute and chronic respiratory failure, unspecified whether with hypoxia or hypercapnia: Secondary | ICD-10-CM | POA: Diagnosis not present

## 2021-01-12 DIAGNOSIS — I25110 Atherosclerotic heart disease of native coronary artery with unstable angina pectoris: Secondary | ICD-10-CM | POA: Diagnosis present

## 2021-01-12 DIAGNOSIS — I130 Hypertensive heart and chronic kidney disease with heart failure and stage 1 through stage 4 chronic kidney disease, or unspecified chronic kidney disease: Secondary | ICD-10-CM | POA: Diagnosis present

## 2021-01-12 DIAGNOSIS — E1165 Type 2 diabetes mellitus with hyperglycemia: Secondary | ICD-10-CM | POA: Diagnosis present

## 2021-01-12 DIAGNOSIS — N179 Acute kidney failure, unspecified: Secondary | ICD-10-CM | POA: Diagnosis present

## 2021-01-12 DIAGNOSIS — E669 Obesity, unspecified: Secondary | ICD-10-CM | POA: Diagnosis present

## 2021-01-12 DIAGNOSIS — E785 Hyperlipidemia, unspecified: Secondary | ICD-10-CM | POA: Diagnosis present

## 2021-01-12 DIAGNOSIS — T380X5A Adverse effect of glucocorticoids and synthetic analogues, initial encounter: Secondary | ICD-10-CM | POA: Diagnosis not present

## 2021-01-12 DIAGNOSIS — Z20822 Contact with and (suspected) exposure to covid-19: Secondary | ICD-10-CM | POA: Diagnosis present

## 2021-01-12 DIAGNOSIS — Z7982 Long term (current) use of aspirin: Secondary | ICD-10-CM | POA: Diagnosis not present

## 2021-01-12 DIAGNOSIS — J9621 Acute and chronic respiratory failure with hypoxia: Secondary | ICD-10-CM | POA: Diagnosis present

## 2021-01-12 DIAGNOSIS — D631 Anemia in chronic kidney disease: Secondary | ICD-10-CM | POA: Diagnosis present

## 2021-01-12 DIAGNOSIS — L89152 Pressure ulcer of sacral region, stage 2: Secondary | ICD-10-CM | POA: Diagnosis present

## 2021-01-12 DIAGNOSIS — J849 Interstitial pulmonary disease, unspecified: Secondary | ICD-10-CM | POA: Diagnosis not present

## 2021-01-12 DIAGNOSIS — Z7984 Long term (current) use of oral hypoglycemic drugs: Secondary | ICD-10-CM | POA: Diagnosis not present

## 2021-01-12 DIAGNOSIS — I214 Non-ST elevation (NSTEMI) myocardial infarction: Secondary | ICD-10-CM | POA: Diagnosis not present

## 2021-01-12 DIAGNOSIS — J84112 Idiopathic pulmonary fibrosis: Secondary | ICD-10-CM | POA: Diagnosis present

## 2021-01-12 DIAGNOSIS — R739 Hyperglycemia, unspecified: Secondary | ICD-10-CM

## 2021-01-12 DIAGNOSIS — R0603 Acute respiratory distress: Secondary | ICD-10-CM | POA: Diagnosis not present

## 2021-01-12 DIAGNOSIS — I5043 Acute on chronic combined systolic (congestive) and diastolic (congestive) heart failure: Secondary | ICD-10-CM | POA: Diagnosis present

## 2021-01-12 DIAGNOSIS — K219 Gastro-esophageal reflux disease without esophagitis: Secondary | ICD-10-CM | POA: Diagnosis present

## 2021-01-12 DIAGNOSIS — E1122 Type 2 diabetes mellitus with diabetic chronic kidney disease: Secondary | ICD-10-CM | POA: Diagnosis present

## 2021-01-12 DIAGNOSIS — J440 Chronic obstructive pulmonary disease with acute lower respiratory infection: Secondary | ICD-10-CM | POA: Diagnosis not present

## 2021-01-12 LAB — GLUCOSE, CAPILLARY
Glucose-Capillary: 117 mg/dL — ABNORMAL HIGH (ref 70–99)
Glucose-Capillary: 117 mg/dL — ABNORMAL HIGH (ref 70–99)
Glucose-Capillary: 145 mg/dL — ABNORMAL HIGH (ref 70–99)
Glucose-Capillary: 206 mg/dL — ABNORMAL HIGH (ref 70–99)
Glucose-Capillary: 243 mg/dL — ABNORMAL HIGH (ref 70–99)

## 2021-01-12 LAB — D-DIMER, QUANTITATIVE: D-Dimer, Quant: 4.44 ug/mL-FEU — ABNORMAL HIGH (ref 0.00–0.50)

## 2021-01-12 LAB — CBC
HCT: 31.7 % — ABNORMAL LOW (ref 36.0–46.0)
HCT: 32 % — ABNORMAL LOW (ref 36.0–46.0)
Hemoglobin: 9.8 g/dL — ABNORMAL LOW (ref 12.0–15.0)
Hemoglobin: 9.9 g/dL — ABNORMAL LOW (ref 12.0–15.0)
MCH: 28.7 pg (ref 26.0–34.0)
MCH: 28.9 pg (ref 26.0–34.0)
MCHC: 30.9 g/dL (ref 30.0–36.0)
MCHC: 30.9 g/dL (ref 30.0–36.0)
MCV: 92.8 fL (ref 80.0–100.0)
MCV: 93.5 fL (ref 80.0–100.0)
Platelets: 293 10*3/uL (ref 150–400)
Platelets: 295 10*3/uL (ref 150–400)
RBC: 3.39 MIL/uL — ABNORMAL LOW (ref 3.87–5.11)
RBC: 3.45 MIL/uL — ABNORMAL LOW (ref 3.87–5.11)
RDW: 18.7 % — ABNORMAL HIGH (ref 11.5–15.5)
RDW: 18.8 % — ABNORMAL HIGH (ref 11.5–15.5)
WBC: 11.4 10*3/uL — ABNORMAL HIGH (ref 4.0–10.5)
WBC: 11.7 10*3/uL — ABNORMAL HIGH (ref 4.0–10.5)
nRBC: 0 % (ref 0.0–0.2)
nRBC: 0 % (ref 0.0–0.2)

## 2021-01-12 LAB — PROTIME-INR
INR: 1.3 — ABNORMAL HIGH (ref 0.8–1.2)
Prothrombin Time: 16.5 seconds — ABNORMAL HIGH (ref 11.4–15.2)

## 2021-01-12 LAB — BASIC METABOLIC PANEL
Anion gap: 9 (ref 5–15)
Anion gap: 9 (ref 5–15)
BUN: 24 mg/dL — ABNORMAL HIGH (ref 8–23)
BUN: 22 mg/dL (ref 8–23)
CO2: 19 mmol/L — ABNORMAL LOW (ref 22–32)
CO2: 21 mmol/L — ABNORMAL LOW (ref 22–32)
Calcium: 8.7 mg/dL — ABNORMAL LOW (ref 8.9–10.3)
Calcium: 8.3 mg/dL — ABNORMAL LOW (ref 8.9–10.3)
Chloride: 104 mmol/L (ref 98–111)
Chloride: 105 mmol/L (ref 98–111)
Creatinine, Ser: 1.76 mg/dL — ABNORMAL HIGH (ref 0.44–1.00)
Creatinine, Ser: 1.74 mg/dL — ABNORMAL HIGH (ref 0.44–1.00)
GFR, Estimated: 29 mL/min — ABNORMAL LOW (ref >60)
GFR, Estimated: 29 mL/min — ABNORMAL LOW (ref >60)
Glucose, Bld: 152 mg/dL — ABNORMAL HIGH (ref 70–99)
Glucose, Bld: 110 mg/dL — ABNORMAL HIGH (ref 70–99)
Potassium: 4.2 mmol/L (ref 3.5–5.1)
Potassium: 3.8 mmol/L (ref 3.5–5.1)
Sodium: 132 mmol/L — ABNORMAL LOW (ref 135–145)
Sodium: 135 mmol/L (ref 135–145)

## 2021-01-12 LAB — BLOOD GAS, VENOUS
Acid-base deficit: 4.7 mmol/L — ABNORMAL HIGH (ref 0.0–2.0)
Bicarbonate: 19.7 mmol/L — ABNORMAL LOW (ref 20.0–28.0)
O2 Saturation: 68.1 %
Patient temperature: 98.6
pCO2, Ven: 36 mmHg — ABNORMAL LOW (ref 44.0–60.0)
pH, Ven: 7.356 (ref 7.250–7.430)
pO2, Ven: 40.2 mmHg (ref 32.0–45.0)

## 2021-01-12 LAB — RESP PANEL BY RT-PCR (FLU A&B, COVID) ARPGX2
Influenza A by PCR: NEGATIVE
Influenza B by PCR: NEGATIVE
SARS Coronavirus 2 by RT PCR: NEGATIVE

## 2021-01-12 LAB — TROPONIN I (HIGH SENSITIVITY)
Troponin I (High Sensitivity): 466 ng/L (ref <18)
Troponin I (High Sensitivity): 474 ng/L (ref <18)
Troponin I (High Sensitivity): 210 ng/L (ref <18)

## 2021-01-12 LAB — PROCALCITONIN: Procalcitonin: 2.54 ng/mL

## 2021-01-12 LAB — APTT: aPTT: 28 seconds (ref 24–36)

## 2021-01-12 LAB — HEPARIN LEVEL (UNFRACTIONATED): Heparin Unfractionated: 0.26 IU/mL — ABNORMAL LOW (ref 0.30–0.70)

## 2021-01-12 LAB — SODIUM, URINE, RANDOM: Sodium, Ur: 34 mmol/L

## 2021-01-12 LAB — MAGNESIUM: Magnesium: 1.9 mg/dL (ref 1.7–2.4)

## 2021-01-12 LAB — BRAIN NATRIURETIC PEPTIDE: B Natriuretic Peptide: 1114.8 pg/mL — ABNORMAL HIGH (ref 0.0–100.0)

## 2021-01-12 LAB — CBG MONITORING, ED: Glucose-Capillary: 105 mg/dL — ABNORMAL HIGH (ref 70–99)

## 2021-01-12 LAB — EXPECTORATED SPUTUM ASSESSMENT W GRAM STAIN, RFLX TO RESP C

## 2021-01-12 LAB — MRSA NEXT GEN BY PCR, NASAL: MRSA by PCR Next Gen: NOT DETECTED

## 2021-01-12 LAB — LACTIC ACID, PLASMA
Lactic Acid, Venous: 1.8 mmol/L (ref 0.5–1.9)
Lactic Acid, Venous: 1.6 mmol/L (ref 0.5–1.9)

## 2021-01-12 LAB — CREATININE, URINE, RANDOM: Creatinine, Urine: 29.66 mg/dL

## 2021-01-12 LAB — STREP PNEUMONIAE URINARY ANTIGEN: Strep Pneumo Urinary Antigen: NEGATIVE

## 2021-01-12 MED ORDER — CEFTRIAXONE SODIUM 2 G IJ SOLR
2.0000 g | INTRAMUSCULAR | Status: AC
  Administered 2021-01-13 – 2021-01-17 (×5): 2 g via INTRAVENOUS
  Filled 2021-01-12 (×5): qty 20

## 2021-01-12 MED ORDER — ROSUVASTATIN CALCIUM 20 MG PO TABS
40.0000 mg | ORAL_TABLET | Freq: Every day | ORAL | Status: DC
  Administered 2021-01-12 – 2021-01-17 (×6): 40 mg via ORAL
  Filled 2021-01-12 (×6): qty 2

## 2021-01-12 MED ORDER — IOHEXOL 350 MG/ML SOLN
40.0000 mL | Freq: Once | INTRAVENOUS | Status: AC | PRN
  Administered 2021-01-12: 40 mL via INTRAVENOUS

## 2021-01-12 MED ORDER — POLYETHYLENE GLYCOL 3350 17 G PO PACK: 17.0000 g | PACK | Freq: Every day | ORAL | Status: DC | PRN

## 2021-01-12 MED ORDER — HEPARIN BOLUS VIA INFUSION
2000.0000 [IU] | Freq: Once | INTRAVENOUS | Status: AC
  Administered 2021-01-12: 2000 [IU] via INTRAVENOUS
  Filled 2021-01-12: qty 2000

## 2021-01-12 MED ORDER — FUROSEMIDE 10 MG/ML IJ SOLN
20.0000 mg | Freq: Once | INTRAMUSCULAR | Status: AC
  Administered 2021-01-12: 20 mg via INTRAVENOUS
  Filled 2021-01-12: qty 2

## 2021-01-12 MED ORDER — LORATADINE 10 MG PO TABS
10.0000 mg | ORAL_TABLET | Freq: Every day | ORAL | Status: DC
  Administered 2021-01-12 – 2021-01-17 (×6): 10 mg via ORAL
  Filled 2021-01-12 (×6): qty 1

## 2021-01-12 MED ORDER — METHYLPREDNISOLONE SODIUM SUCC 125 MG IJ SOLR
125.0000 mg | Freq: Once | INTRAMUSCULAR | Status: AC
  Administered 2021-01-12: 125 mg via INTRAVENOUS
  Filled 2021-01-12: qty 2

## 2021-01-12 MED ORDER — DOCUSATE SODIUM 100 MG PO CAPS
100.0000 mg | ORAL_CAPSULE | Freq: Two times a day (BID) | ORAL | Status: DC
  Administered 2021-01-12 – 2021-01-17 (×7): 100 mg via ORAL
  Filled 2021-01-12 (×8): qty 1

## 2021-01-12 MED ORDER — CHLORHEXIDINE GLUCONATE CLOTH 2 % EX PADS
6.0000 | MEDICATED_PAD | Freq: Every day | CUTANEOUS | Status: DC
  Administered 2021-01-12 – 2021-01-15 (×4): 6 via TOPICAL

## 2021-01-12 MED ORDER — ADULT MULTIVITAMIN W/MINERALS CH
1.0000 | ORAL_TABLET | Freq: Every day | ORAL | Status: DC
  Administered 2021-01-12 – 2021-01-17 (×6): 1 via ORAL
  Filled 2021-01-12 (×6): qty 1

## 2021-01-12 MED ORDER — HEPARIN SODIUM (PORCINE) 5000 UNIT/ML IJ SOLN
5000.0000 [IU] | Freq: Three times a day (TID) | INTRAMUSCULAR | Status: DC
  Administered 2021-01-12 – 2021-01-13 (×3): 5000 [IU] via SUBCUTANEOUS
  Filled 2021-01-12 (×3): qty 1

## 2021-01-12 MED ORDER — ALLOPURINOL 100 MG PO TABS
300.0000 mg | ORAL_TABLET | Freq: Every day | ORAL | Status: DC
  Administered 2021-01-12 – 2021-01-17 (×6): 300 mg via ORAL
  Filled 2021-01-12 (×5): qty 3
  Filled 2021-01-12: qty 1

## 2021-01-12 MED ORDER — FENTANYL CITRATE PF 50 MCG/ML IJ SOSY
25.0000 ug | PREFILLED_SYRINGE | Freq: Once | INTRAMUSCULAR | Status: AC
  Administered 2021-01-12: 25 ug via INTRAVENOUS
  Filled 2021-01-12: qty 1

## 2021-01-12 MED ORDER — FLUTICASONE FUROATE-VILANTEROL 100-25 MCG/INH IN AEPB
1.0000 | INHALATION_SPRAY | Freq: Every day | RESPIRATORY_TRACT | Status: DC
  Administered 2021-01-12 – 2021-01-17 (×6): 1 via RESPIRATORY_TRACT
  Filled 2021-01-12: qty 28

## 2021-01-12 MED ORDER — BENZONATATE 100 MG PO CAPS
100.0000 mg | ORAL_CAPSULE | Freq: Three times a day (TID) | ORAL | Status: DC | PRN
  Administered 2021-01-13: 100 mg via ORAL
  Filled 2021-01-12 (×2): qty 1

## 2021-01-12 MED ORDER — PREDNISONE 20 MG PO TABS: 40.0000 mg | ORAL_TABLET | Freq: Every day | ORAL | Status: DC

## 2021-01-12 MED ORDER — METOPROLOL SUCCINATE ER 25 MG PO TB24
25.0000 mg | ORAL_TABLET | Freq: Every day | ORAL | Status: DC
  Administered 2021-01-13 – 2021-01-17 (×5): 25 mg via ORAL
  Filled 2021-01-12 (×6): qty 1

## 2021-01-12 MED ORDER — ALBUTEROL SULFATE (2.5 MG/3ML) 0.083% IN NEBU
3.0000 mL | INHALATION_SOLUTION | RESPIRATORY_TRACT | Status: DC | PRN
  Administered 2021-01-14: 3 mL via RESPIRATORY_TRACT
  Filled 2021-01-12: qty 3

## 2021-01-12 MED ORDER — OMEGA-3-ACID ETHYL ESTERS 1 G PO CAPS
1.0000 g | ORAL_CAPSULE | Freq: Every day | ORAL | Status: DC
  Administered 2021-01-12 – 2021-01-17 (×6): 1 g via ORAL
  Filled 2021-01-12 (×6): qty 1

## 2021-01-12 MED ORDER — PANTOPRAZOLE SODIUM 40 MG PO TBEC
40.0000 mg | DELAYED_RELEASE_TABLET | Freq: Every day | ORAL | Status: DC
  Administered 2021-01-12 – 2021-01-17 (×6): 40 mg via ORAL
  Filled 2021-01-12 (×6): qty 1

## 2021-01-12 MED ORDER — HYDROCORTISONE SOD SUC (PF) 100 MG IJ SOLR
100.0000 mg | Freq: Three times a day (TID) | INTRAMUSCULAR | Status: DC
  Administered 2021-01-12 – 2021-01-13 (×2): 100 mg via INTRAVENOUS
  Filled 2021-01-12 (×2): qty 2

## 2021-01-12 MED ORDER — POLYETHYLENE GLYCOL 3350 17 G PO PACK
17.0000 g | PACK | Freq: Every day | ORAL | Status: DC
  Filled 2021-01-12 (×4): qty 1

## 2021-01-12 MED ORDER — GABAPENTIN 300 MG PO CAPS
300.0000 mg | ORAL_CAPSULE | ORAL | Status: DC
  Administered 2021-01-12 – 2021-01-17 (×16): 300 mg via ORAL
  Filled 2021-01-12 (×2): qty 1
  Filled 2021-01-12: qty 3
  Filled 2021-01-12 (×14): qty 1

## 2021-01-12 MED ORDER — VITAMIN B-12 1000 MCG PO TABS
1000.0000 ug | ORAL_TABLET | Freq: Every day | ORAL | Status: DC
  Filled 2021-01-12: qty 1

## 2021-01-12 MED ORDER — VITAMIN D 25 MCG (1000 UNIT) PO TABS
1000.0000 [IU] | ORAL_TABLET | Freq: Every day | ORAL | Status: DC
  Administered 2021-01-12 – 2021-01-17 (×6): 1000 [IU] via ORAL
  Filled 2021-01-12 (×7): qty 1

## 2021-01-12 MED ORDER — DOCUSATE SODIUM 100 MG PO CAPS: 100.0000 mg | ORAL_CAPSULE | Freq: Two times a day (BID) | ORAL | Status: DC | PRN

## 2021-01-12 MED ORDER — HEPARIN (PORCINE) 25000 UT/250ML-% IV SOLN
1100.0000 [IU]/h | INTRAVENOUS | Status: DC
  Administered 2021-01-12: 1100 [IU]/h via INTRAVENOUS
  Filled 2021-01-12: qty 250

## 2021-01-12 MED ORDER — UMECLIDINIUM BROMIDE 62.5 MCG/INH IN AEPB
1.0000 | INHALATION_SPRAY | Freq: Every day | RESPIRATORY_TRACT | Status: DC
  Administered 2021-01-12 – 2021-01-17 (×6): 1 via RESPIRATORY_TRACT
  Filled 2021-01-12: qty 7

## 2021-01-12 MED ORDER — MAGNESIUM OXIDE 400 MG PO TABS
400.0000 mg | ORAL_TABLET | Freq: Every day | ORAL | Status: DC
  Administered 2021-01-12: 400 mg via ORAL
  Filled 2021-01-12 (×2): qty 1

## 2021-01-12 MED ORDER — GABAPENTIN 300 MG PO CAPS: 300.0000 mg | ORAL_CAPSULE | Freq: Three times a day (TID) | ORAL | Status: DC

## 2021-01-12 MED ORDER — ACETAMINOPHEN 325 MG PO TABS
650.0000 mg | ORAL_TABLET | Freq: Four times a day (QID) | ORAL | Status: DC | PRN
  Administered 2021-01-13 – 2021-01-15 (×4): 650 mg via ORAL
  Filled 2021-01-12 (×5): qty 2

## 2021-01-12 MED ORDER — INSULIN ASPART 100 UNIT/ML IJ SOLN
3.0000 [IU] | INTRAMUSCULAR | Status: DC
  Administered 2021-01-12: 9 [IU] via SUBCUTANEOUS
  Administered 2021-01-12: 3 [IU] via SUBCUTANEOUS
  Administered 2021-01-12 – 2021-01-13 (×2): 9 [IU] via SUBCUTANEOUS
  Administered 2021-01-13: 6 [IU] via SUBCUTANEOUS
  Administered 2021-01-13: 3 [IU] via SUBCUTANEOUS
  Administered 2021-01-13: 9 [IU] via SUBCUTANEOUS
  Filled 2021-01-12: qty 0.09

## 2021-01-12 NOTE — ED Notes (Signed)
Pt set up on high-flow [place] II

## 2021-01-12 NOTE — Progress Notes (Signed)
ANTICOAGULATION CONSULT NOTE - Initial Consult  Pharmacy Consult for heparin Indication: pulmonary embolus  Allergies  Allergen Reactions   Morphine And Related Nausea And Vomiting    Patient Measurements: Height: 5\' 7"  (170.2 cm) Weight: 73.2 kg (161 lb 6 oz) IBW/kg (Calculated) : 61.6 Heparin Dosing Weight: 73.2kg  Vital Signs: Temp: 100.7 F (38.2 C) (10/05 0550) BP: 101/64 (10/05 0730) Pulse Rate: 95 (10/05 0730)  Labs: Recent Labs    01/12/21 0000 01/12/21 0205 01/12/21 0433 01/12/21 0946  HGB 9.9*  --  9.8*  --   HCT 32.0*  --  31.7*  --   PLT 293  --  295  --   APTT  --  28  --   --   LABPROT  --  16.5*  --   --   INR  --  1.3*  --   --   HEPARINUNFRC  --   --   --  0.26*  CREATININE 1.76*  --  1.74*  --   TROPONINIHS 466* 474*  --   --      Estimated Creatinine Clearance: 24.7 mL/min (A) (by C-G formula based on SCr of 1.74 mg/dL (H)).   Medical History: Past Medical History:  Diagnosis Date   Anemia 10/28/2010   unspecified   Anxiety    Aortic valve sclerosis    Arthritis    Asthma    Chronic diastolic CHF (congestive heart failure) (HCC)    Echo 12/2020 = nl EF, Grade 1 DD, normal RV function, no mention of pulm art pressure though   Chronic kidney disease    Chronic Kidney Disease---Stage III   Coronary artery disease 10/28/2010   DDD (degenerative disc disease), lumbar 2006   Diabetes type 2, controlled (HCC) 10/28/2010   Dyspnea    GERD (gastroesophageal reflux disease)    Gout 10/28/2010   Heart murmur    History of cardiac catheterization 2006   LAD 50%, D1 50% rest normal.    History of cardiovascular stress test 09/23/2009    lexiscan Normal Cardiolite test, EF 61%. LV size and function - normal   History of echocardiogram 05/27/2008   EF 55%; Mild aortic insufficiency with minimal aortic sclerosis. L vent normal.    Hyperlipidemia    Hyperlipidemia LDL goal <100 10/28/2010   managed by Dr. 10/30/2010   Hypertension 10/28/2010    IPF (idiopathic pulmonary fibrosis) (HCC)    Progressive, with UIP   Mild vitamin D deficiency 02/22/2011   Obesity (BMI 30-39.9) 10/28/2010   unspecified   Osteoarthritis 10/28/2010   bilateral knees, left hip   Personal history of colonic polyps 10/28/2010   Shortness of breath    Syncope 2013   Vitamin B 12 deficiency 10/28/2010    Assessment: 80 yo female arrived from [facility], has COPD and CHF and normally on 2L oxygen at baseline.  Pt more SOB than usual per [facility] staff.  Pharmacy consulted to dose heparin empirically while PE ruled out.  No prior AC noted.  CTA appears negative for PE - relayed to CCM and they will evaluate need for continued anticoagulation.   Goal of Therapy:  Heparin level 0.3-0.7 units/ml Monitor platelets by anticoagulation protocol: Yes   Plan:  Per CCM, discontinue heparin drip Initiate heparin subcutaneous for DVT ppx  [facility], PharmD 01/12/2021 10:53 AM

## 2021-01-12 NOTE — ED Notes (Signed)
Called daughter [name] to give her an update.

## 2021-01-12 NOTE — Progress Notes (Addendum)
NAME:  Sierra Guzman, MRN:  [id number], DOB:  January 13, 1941, LOS: 0 ADMISSION DATE:  01/11/2021, CONSULTATION DATE:  01/12/21 REFERRING MD:  Sierra Guzman, CHIEF COMPLAINT:  hypoxia   History of Present Illness:  Sierra Guzman is an 80 y/o woman with a history of pulmonary fibrosis previously evaluated and cared for at [facility] who presents to ED from rehab after 2 recent hospitalizations.  She feels like her breathing is at baseline, but she was sent in by [facility] due to perceived increased shortness of breath.  She is on 2 L of oxygen at rest and 6 L of oxygen with activity at baseline since her most recent outpatient walk test in July 2022.  Per her outpatient pulmonary notes, she carries a diagnosis to postinflammatory interstitial lung disease ans asthma.  In her recent hospitalization at [facility] she was treated with steroids, which been tapered off. Until recently she was on pirfenidone 2 tabs TID, which was stopped during her hospitalization in September. She was on a prednisone taper as an outpatient in July and had it extended in August 2022. She is on Advair and had Incruse added in late July. She is on PPI for GERD. She denies recent URI symptoms, fevers, chills, n/v/d, rashes. She has no history of VTE.  Pertinent  Medical History  ILD, Chronic respiratory failure with hypoxia- 2L at rest, 6L with activity, HTN, HLD, GERD, Gout  Significant Hospital Events: Including procedures, antibiotic start and stop dates in addition to other pertinent events   10/5 admitted.  Started on systemic steroids and empiric antibiotics.  Interim History / Subjective:  Feels a little better   Objective   Blood pressure 101/64, pulse 95, temperature (Abnormal) 100.7 F (38.2 C), resp. rate (Abnormal) 27, height 5\' 7"  (1.702 m), weight 73.2 kg, SpO2 100 %.    FiO2 (%):  [35 %] 35 %  No intake or output data in the 24 hours ending 01/12/21 1107 Filed Weights   01/11/21 2300  Weight: 73.2 kg     Examination: General resting comfortably in bed no acute distress HEENT normocephalic atraumatic no jugular venous distention appreciated Pulmonary: Dry posterior rales, currently on 8 L high flow oxygen Cardiac: Regular rate and rhythm Abdomen: Soft nontender Extremities: Warm dry Neuro: Awake oriented no focal deficits GU: Voids.   Resolved Hospital Problem list     Assessment & Plan:  Acute on chronic hypoxic respiratory failure-suspect this is due to an ILD flare.  Pirfenidone recently stopped.  She has required steroids in July through August and again in September during her hospital admission for acute flares.  Elevated CRP and sed rate in September suggest this is an inflammatory process, and clinically it seems to improve with steroids. -ustrep neg Plan Cont current steroid dosing (pred 40)  Likely needs to be to be back on pirfenidone as out-pt F/u legionella  Cont CAP coverage  Dc heparin, change to   echo  Elevated troponin. EKG without ischemic changes. -CTA to evaluate for PE pending Plan Cont tele   Hyponatremia- looks euvolemic but increased BNP suggests hypervolemic Plan Am chem  echo  AKI on CKD 3B FENA 1.2 so could be prerenal OR ATN Plan Cont strict I&O No change in IVFs Hold lasix for now Renal dose meds Serial chem   Asthma, not acutely exacerbated Plan Cont current BDs and ICS   GERD plan Ppi   Gout Plan Allopurinol   HLD Plan Crestor and fish oil   Chronic anemia  Plan Holding B12 Transfuse for hgb < 7  Diabetes with hyperglycemia, type 2 - Hold PTA metformin Plan Ssi   Best Practice (right click and "Reselect all SmartList Selections" daily)   Diet/type: Regular consistency (see orders) DVT prophylaxis: systemic heparin GI prophylaxis: PPI Lines: N/A Foley:  N/A Code Status:  full code Last date of multidisciplinary goals of care discussion [ did not want elective intubation, but ok with it in the context of  cardiac arrest; she would want full resuscitation. Daughter is Run[employer], broadcasting/film/video. ]     Critical care time: NA     Sierra Mattocks, DO 01/12/21 11:07 AM [facility]     ATTESTATION & SIGNATURE   STAFF NOTE: I, Sierra Guzman have personally reviewed patient's available data, including medical history, events of note, physical examination and test results as part of my evaluation. I have discussed with resident/NP and other care providers such as pharmacist, RN and RRT.  In addition,  I personally evaluated patient and elicited key findings of   S: Date of admit 01/11/2021 with LOS 0 for today 01/12/2021 : Sierra Guzman is - [place] [address] [place]. Sees [facility] ILD clinic. Wants to get care at [facility] if possible. Now in [place] in rehab plae. As July 2022 [facility] visit - 2-3L rest, 6L exertion. 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 TElls me she was stable but acutly felt unwell and then found to be hyppxemic. No resp distres. 10L O2 pulse ox 100% this afternoon. Trop in 400s (normal last month). BNP 1110 (prior 90s last mont). Had normal echo last month  Nurisng noes tjat SBP 85 and MAP > 55 when sleeps but normalizes when awake or aroused   Has picked up fever at admit 100.7 and high  WBC - cw SIRS. OnCAP Rx  Results for Sierra Guzman (MRN [id number]) as of 01/12/2021 17:12  Ref. Range 01/12/2021 05:53  Strep Pneumo Urinary Antigen Latest Ref Range: NEGATIVE  NEGATIVE    O:  Blood pressure (!) 86/45, pulse 98, temperature (!) 100.7 F (38.2 C), resp. rate (!) 28, height 5\' 7"  (1.702 m), weight 73.2 kg, SpO2 100 %.   Frail female Has blanket on AxOx3 Abd soft Crackles +   LABS  Results for Sierra Guzman (MRN Sierra Guzman) as of 01/12/2021 17:12  Ref. Range 12/05/2020 01:52 12/21/2020 20:09  12/21/2020 22:15 01/12/2021 00:00 01/12/2021 02:05  Troponin I (High Sensitivity) Latest Ref Range: <18 ng/L 17 9 9  466 (HH) 474 [facility])   Results for DANYELA, POSAS (MRN [facility]) as of 01/12/2021 17:12  Ref. Range 11/03/2020 19:52 12/04/2020 23:30 12/27/2020 18:13 01/12/2021 00:00  B Natriuretic Peptide Latest Ref Range: 0.0 - 100.0 pg/mL 93.5 87.1 32.1 1,114.8 (H)   PULMONARY Recent Labs  Lab 01/12/21 0000  HCO3 19.7*  O2SAT 68.1    CBC Recent Labs  Lab 01/12/21 0000 01/12/21 0433  HGB 9.9* 9.8*  HCT 32.0* 31.7*  WBC 11.7* 11.4*  PLT 293 295    COAGULATION Recent Labs  Lab 01/12/21 0205  INR 1.3*    CARDIAC  No results for input(s): TROPONINI in the last 168 hours. No results for input(s): PROBNP in the last 168 hours.   CHEMISTRY Recent Labs  Lab 01/12/21 0000 01/12/21 03/14/21  NA 132* 135  K 4.2 3.8  CL 104 105  CO2 19* 21*  GLUCOSE 152* 110*  BUN 24* 22  CREATININE 1.76* 1.74*  CALCIUM 8.7* 8.3*   Estimated Creatinine Clearance: 24.7 mL/min (A) (by C-G formula based on SCr of 1.74 mg/dL (H)).   LIVER Recent Labs  Lab 01/12/21 0205  INR 1.3*     INFECTIOUS Recent Labs  Lab 01/12/21 0300 01/12/21 0510  LATICACIDVEN 1.8 1.6     ENDOCRINE CBG (last 3)  Recent Labs    01/12/21 0844 01/12/21 1208 01/12/21 1521  GLUCAP 145* 206* 117*         IMAGING x24h  - image(s) personally visualized  -   highlighted in bold CT Angio Chest Pulmonary Embolism (PE) W or WO Contrast  Result Date: 01/12/2021 CLINICAL DATA:  Shortness of breath. EXAM: CT ANGIOGRAPHY CHEST WITH CONTRAST TECHNIQUE: Multidetector CT imaging of the chest was performed using the standard protocol during bolus administration of intravenous contrast. Multiplanar CT image reconstructions and MIPs were obtained to evaluate the vascular anatomy. CONTRAST:  17mL OMNIPAQUE IOHEXOL 350 MG/ML SOLN COMPARISON:  December 06, 2020 FINDINGS: Cardiovascular: There is moderate to marked severity  calcification of the aortic arch, without evidence of aortic aneurysm or dissection. The subsegmental pulmonary arteries are limited in evaluation secondary to patient motion and areas of overlying artifact. This is most prominent within the bilateral lung bases. No evidence of pulmonary embolism. Normal heart size with marked severity coronary artery calcification. No pericardial effusion. Mediastinum/Nodes: There is moderate severity mediastinal and right hilar lymphadenopathy with a 3.4 cm x 2.4 cm right paratracheal lymph node noted. This measures approximately 1.5 cm on the prior study. Thyroid gland, trachea, and esophagus demonstrate no significant findings. Lungs/Pleura: Stable, marked severity diffuse interstitial lung disease is noted bilaterally. This is most prominent within the bilateral lower lobes. Associated bronchiectatic changes are noted. Mild to moderate severity superimposed infiltrates are suspected within the posterior aspects of the bilateral upper lobes. There is no evidence of a pleural effusion or pneumothorax. Upper Abdomen: There is a large, stable gastric hernia. A stable 3.3 cm diameter simple cyst is seen within the upper pole of the left kidney. Musculoskeletal: Multilevel degenerative changes seen throughout the thoracic spine. Review of the MIP images confirms the above findings. IMPRESSION: 1. Marked severity chronic interstitial lung disease. 2. Mild to moderate severity superimposed bilateral upper lobe infiltrates. 3. No evidence of pulmonary embolism. 4. Large, stable gastric hernia. 5. Stable 3.3 cm diameter simple cyst within the upper pole of the left kidney. 6. Aortic atherosclerosis. Aortic Atherosclerosis (ICD10-I70.0). Electronically Signed   By: Aram Candela M.D.   On: 01/12/2021 03:37   US RENAL  Result Date: 01/12/2021 CLINICAL DATA:  Initial evaluation for acute kidney injury. EXAM: RENAL / URINARY TRACT ULTRASOUND COMPLETE COMPARISON:  Ultrasound from  12/30/2020. FINDINGS: Right Kidney: Renal measurements: 9.2 x 4.6 x 4.4 cm = volume: 97.9 mL. Increased echogenicity within the renal parenchyma. No nephrolithiasis or hydronephrosis. 6.4 x 5.0 x 5.1 cm simple cyst present at the lower pole. An adjacent smaller simple cyst measures 2.7 x 2.6 x 2.1 cm. No solid renal mass. Left Kidney: Renal measurements: 9.8 x 4.8 x 5.7 cm = volume: 139.2 mL. Increased echogenicity within the renal parenchyma. No nephrolithiasis or hydronephrosis. 3.4 x 2.8 x 3.0 cm complex cyst with internal septations present at the upper pole. No internal vascularity or solid component. This is stable from prior. Bladder: Appears normal for degree  of bladder distention. Other: None. IMPRESSION: 1. No hydronephrosis. 2. Increased echogenicity within the renal parenchyma, compatible with medical renal disease. 3. Bilateral renal cysts as above, including a 3.4 cm complex septated cyst at the upper pole. These are felt to be benign, and are similar to previous. Electronically Signed   By: Rise Mu M.D.   On: 01/12/2021 04:36   DG Chest Port 1 View  Result Date: 01/11/2021 CLINICAL DATA:  Shortness of breath. EXAM: PORTABLE CHEST 1 VIEW COMPARISON:  Chest x-ray 12/28/2018. FINDINGS: Cardiomediastinal silhouette is stable. There are patchy multifocal airspace opacities bilaterally as well as diffuse interstitial prominence. Airspace opacities are new in the right mid and lower lung. There is a small left pleural effusion. There is no pneumothorax. No acute fractures are seen. IMPRESSION: 1. Chronic interstitial lung disease. New patchy airspace opacities in the right upper and lower lung worrisome for superimposed infection. 2. Small left pleural effusion. Electronically Signed   By: Darliss Cheney M.D.   On: 01/11/2021 23:35      A:Known IPF with chronic resp failure -s evere Deconditioned iwht FTT Baseline severe Protein calorie malnurition - alb 2.7 sept 202 [place]  anemia  Now with acoute on chronic resp failure - initiatly considreed as IPF flare . PE reuled out. But BNP profile suggs  acute on chronic chf (not clear if systolic of diast)  ? Type 2 NSTEMI at admit  ? Septic with pneumoania  BP soft but adeuqate  P: Ok for SDU Continue CAP Rx Start Diuresis BiPAP if needed Accept SBP > 85 but monitor with lasix Check PCT, Trop  Anti-infectives (From admission, onward)    Start     Dose/Rate Route Frequency Ordered Stop   01/13/21 0000  cefTRIAXone (ROCEPHIN) 2 g in sodium chloride 0.9 % 100 mL IVPB        2 g 200 mL/hr over 30 Minutes Intravenous Every 24 hours 01/12/21 0308     01/12/21 0000  cefTRIAXone (ROCEPHIN) 2 g in sodium chloride 0.9 % 100 mL IVPB        2 g 200 mL/hr over 30 Minutes Intravenous  Once 01/11/21 2350 01/12/21 0411   01/12/21 0000  azithromycin (ZITHROMAX) 500 mg in sodium chloride 0.9 % 250 mL IVPB        500 mg 250 mL/hr over 60 Minutes Intravenous Every 24 hours 01/11/21 2350          Rest per NP/medical resident whose note is outlined above and that I agree with  The patient is critically ill with multiple organ systems failure and requires high complexity decision making for assessment and support, frequent evaluation and titration of therapies, application of advanced monitoring technologies and extensive interpretation of multiple databases.   Critical Care Time devoted to patient care services described in this note is  45  Minutes. This time reflects time of care of this signee Sierra Kalman Shan. This critical care time does not reflect procedure time, or teaching time or supervisory time of PA/NP/Med student/Med Resident etc but could involve care discussion time     Sierra. Kalman Shan, M.D., [facility].C.P Pulmonary and Critical Care Medicine Staff Physician [facility]  Pulmonary and Critical Care Pager: [phone], If no answer or between  15:00h - 7:00h: call [phone]   [phone]  01/12/2021 5:21 PM

## 2021-01-12 NOTE — Progress Notes (Signed)
ANTICOAGULATION CONSULT NOTE - Initial Consult  Pharmacy Consult for heparin Indication: pulmonary embolus  Allergies  Allergen Reactions   Morphine And Related Nausea And Vomiting    Patient Measurements: Height: 5\' 7"  (170.2 cm) Weight: 73.2 kg (161 lb 6 oz) IBW/kg (Calculated) : 61.6 Heparin Dosing Weight: 73.2kg  Vital Signs: Temp: 99.4 F (37.4 C) (10/04 2257) BP: 96/65 (10/05 0055) Pulse Rate: 102 (10/05 0055)  Labs: Recent Labs    01/12/21 0000  HGB 9.9*  HCT 32.0*  PLT 293  CREATININE 1.76*  TROPONINIHS 466*    Estimated Creatinine Clearance: 24.4 mL/min (A) (by C-G formula based on SCr of 1.76 mg/dL (H)).   Medical History: Past Medical History:  Diagnosis Date   Anemia 10/28/2010   unspecified   Anxiety    Aortic valve sclerosis    Arthritis    Asthma    Chronic diastolic CHF (congestive heart failure) (HCC)    Echo 12/2020 = nl EF, Grade 1 DD, normal RV function, no mention of pulm art pressure though   Chronic kidney disease    Chronic Kidney Disease---Stage III   Coronary artery disease 10/28/2010   DDD (degenerative disc disease), lumbar 2006   Diabetes type 2, controlled (HCC) 10/28/2010   Dyspnea    GERD (gastroesophageal reflux disease)    Gout 10/28/2010   Heart murmur    History of cardiac catheterization 2006   LAD 50%, D1 50% rest normal.    History of cardiovascular stress test 09/23/2009    lexiscan Normal Cardiolite test, EF 61%. LV size and function - normal   History of echocardiogram 05/27/2008   EF 55%; Mild aortic insufficiency with minimal aortic sclerosis. L vent normal.    Hyperlipidemia    Hyperlipidemia LDL goal <100 10/28/2010   managed by Dr. 10/30/2010   Hypertension 10/28/2010   IPF (idiopathic pulmonary fibrosis) (HCC)    Progressive, with UIP   Mild vitamin D deficiency 02/22/2011   Obesity (BMI 30-39.9) 10/28/2010   unspecified   Osteoarthritis 10/28/2010   bilateral knees, left hip   Personal history of  colonic polyps 10/28/2010   Shortness of breath    Syncope 2013   Vitamin B 12 deficiency 10/28/2010      Assessment: 80 yo female arrived from [facility], has COPD and CHF and normally on 2L ox oxygen at baseline.  Pt more SOB than usual per [facility] staff.  Pharmacy consulted to dose heparin for PE.  No prior AC noted.  Hgb 9.9, Plts WNL, Scr 1.76 DDimer 4.4, Trop 466  Goal of Therapy:  Heparin level 0.3-0.7 units/ml Monitor platelets by anticoagulation protocol: Yes   Plan:  Per [place] heparin bolus 2000 units x 1 Start heparin drip at 1100 units/hr Heparin level in 8 hours Daily CBC  [facility] R[facility] 01/12/2021, 1:44 AM

## 2021-01-12 NOTE — ED Notes (Signed)
Patients daughter would like a call back : [name] [phone]

## 2021-01-12 NOTE — ED Notes (Addendum)
Patients daughter Almira Coaster will be the main contact for updates for her mother.  Her contact information is  : [phone]                                              [phone]

## 2021-01-12 NOTE — ED Notes (Signed)
PT requested to be taken off BIPAP. MD Bero agreed. Pt removed from BIPAP. Put on 10L [place]

## 2021-01-12 NOTE — ED Notes (Signed)
Daughter [name] requests critical care doc call her sister Almira Coaster when possible to discuss plan of care. [phone]

## 2021-01-12 NOTE — ED Notes (Signed)
Family requests that daughter GINA be point of contact for updates. Number in chart [phone]

## 2021-01-12 NOTE — H&P (Signed)
NAME:  CASADY VOSHELL, MRN:  [id number], DOB:  [birth year], LOS: 0 ADMISSION DATE:  01/11/2021, CONSULTATION DATE:  01/12/21 REFERRING MD:  Clovis Pu, CHIEF COMPLAINT:  hypoxia   History of Present Illness:  Ms. Dosh is an 80 y/o woman with a history of pulmonary fibrosis previously evaluated and cared for at [facility] who presents to ED from rehab after 2 recent hospitalizations.  She feels like her breathing is at baseline, but she was sent in by [facility] due to perceived increased shortness of breath.  She is on 2 L of oxygen at rest and 6 L of oxygen with activity at baseline since her most recent outpatient walk test in July 2022.  Per her outpatient pulmonary notes, she carries a diagnosis to postinflammatory interstitial lung disease ans asthma.  In her recent hospitalization at [facility] she was treated with steroids, which been tapered off. Until recently she was on pirfenidone 2 tabs TID, which was stopped during her hospitalization in September. She was on a prednisone taper as an outpatient in July and had it extended in August 2022. She is on Advair and had Incruse added in late July. She is on PPI for GERD. She denies recent URI symptoms, fevers, chills, n/v/d, rashes. She has no history of VTE.  Pertinent  Medical History  ILD Chronic respiratory failure with hypoxia- 2L at rest, 6L with activity HTN HLD GERD Gout  Significant Hospital Events: Including procedures, antibiotic start and stop dates in addition to other pertinent events   10/5 admitted  Interim History / Subjective:    Objective   Blood pressure (!) 88/54, pulse 98, temperature 99.4 F (37.4 C), resp. rate (!) 35, height 5\' 7"  (1.702 m), weight 73.2 kg, SpO2 100 %.    FiO2 (%):  [35 %] 35 %  No intake or output data in the 24 hours ending 01/12/21 0250 Filed Weights   01/11/21 2300  Weight: 73.2 kg    Examination: General: frail, chronically ill appearing woman lying in bed in NAD HENT: [place]/AT, eyes  anicteric Lungs: rhales bilaterally, occasional coughing. Mild conversational dyspnea, no accessory muscle use Cardiovascular: S1-S2, regular rate and rhythm, no murmurs.  No JVD. Abdomen: Obese, soft, nontender, nondistended Extremities: No peripheral edema, no clubbing or cyanosis Neuro: Awake and alert, answering questions appropriately, moving all extremities Derm: No rashes or ecchymoses  CXR personally reviewed> more prominent fibrosis, rotated but prominent RUL opacity, increased from previous.  WBC 11.7 BNP 1114.8 Trop 466>474  VBG 7.36/36/40/29.7   Resolved Hospital Problem list     Assessment & Plan:  Acute on chronic hypoxic respiratory failure-suspect this is due to an ILD flare.  Pirfenidone recently stopped.  She has required steroids in July through August and again in September during her hospital admission for acute flares.  Elevated CRP and sed rate in September suggest this is an inflammatory process, and clinically it seems to improve with steroids. -Resume steroids for presumed ILD flare.  Likely needs to get back on pirfenidone as an outpatient. - Empiric CAP antibiotics - Checking  urine Legionella and pneumococcus - sputum culture -CTA to evaluate for PE pending -Appears to be compensating and does not look high risk for intubation at this time.  Admit to the ICU tonight, but hopefully can be transferred out tomorrow  Elevated troponin. EKG without ischemic changes. -CTA to evaluate for PE pending -empiric heparin gtt  Hyponatremia- looks euvolemic but increased BNP suggests hypervolemic -con't to monitor -may need echo to  help determine volume status  AKI on CKD 3B -Checking urine sodium and creatinine - Renal ultrasound - Strict I's/O - Renally dose meds and avoid nephrotoxic meds  Asthma, not acutely exacerbated -con't LABA/LAMA/ICS -albuterol PRN  GERD -con't PPI  Gout -con't allopurinol  HLD -con't crestor and fish oil  Chronic  anemia - Continue B12 - Transfuse for hemoglobin less than 7 or hemodynamically significant bleeding.  Minimize blood draws were able.  Diabetes with hyperglycemia, type 2 - Hold PTA metformin - Sliding scale insulin as needed  Best Practice (right click and "Reselect all SmartList Selections" daily)   Diet/type: Regular consistency (see orders) DVT prophylaxis: systemic heparin GI prophylaxis: PPI Lines: N/A Foley:  N/A Code Status:  full code Last date of multidisciplinary goals of care discussion [ did not want elective intubation, but ok with it in the context of cardiac arrest; she would want full resuscitation. Daughter is Runner, broadcasting/film/video. ]  Labs   CBC: Recent Labs  Lab 01/12/21 0000  WBC 11.7*  HGB 9.9*  HCT 32.0*  MCV 92.8  PLT 293    Basic Metabolic Panel: Recent Labs  Lab 01/12/21 0000  NA 132*  K 4.2  CL 104  CO2 19*  GLUCOSE 152*  BUN 24*  CREATININE 1.76*  CALCIUM 8.7*   GFR: Estimated Creatinine Clearance: 24.4 mL/min (A) (by C-G formula based on SCr of 1.76 mg/dL (H)). Recent Labs  Lab 01/12/21 0000  WBC 11.7*    Liver Function Tests: No results for input(s): AST, ALT, ALKPHOS, BILITOT, PROT, ALBUMIN in the last 168 hours. No results for input(s): LIPASE, AMYLASE in the last 168 hours. No results for input(s): AMMONIA in the last 168 hours.  ABG    Component Value Date/Time   HCO3 19.7 (L) 01/12/2021 0000   ACIDBASEDEF 4.7 (H) 01/12/2021 0000   O2SAT 68.1 01/12/2021 0000     Coagulation Profile: Recent Labs  Lab 01/12/21 0205  INR 1.3*    Cardiac Enzymes: No results for input(s): CKTOTAL, CKMB, CKMBINDEX, TROPONINI in the last 168 hours.  HbA1C: Hgb A1c MFr Bld  Date/Time Value Ref Range Status  12/05/2020 06:23 AM 8.1 (H) 4.8 - 5.6 % Final    Comment:    (NOTE) Pre diabetes:          5.7%-6.4%  Diabetes:              >6.4%  Glycemic control for   <7.0% adults with diabetes     CBG: No results for  input(s): GLUCAP in the last 168 hours.  Review of Systems:   Review of Systems  Constitutional:  Negative for chills, fever and weight loss.  HENT:  Negative for congestion and sore throat.   Eyes: Negative.   Respiratory:  Positive for cough. Negative for shortness of breath.   Cardiovascular:  Negative for chest pain and leg swelling.  Gastrointestinal:  Negative for diarrhea, heartburn, nausea and vomiting.  Genitourinary: Negative.   Musculoskeletal: Negative.   Skin:  Negative for rash.  Neurological:  Negative for weakness.  Endo/Heme/Allergies:  Positive for environmental allergies.    Past Medical History:  She,  has a past medical history of Anemia (10/28/2010), Anxiety, Aortic valve sclerosis, Arthritis, Asthma, Chronic diastolic CHF (congestive heart failure) (HCC), Chronic kidney disease, Coronary artery disease (10/28/2010), DDD (degenerative disc disease), lumbar (2006), Diabetes type 2, controlled (HCC) (10/28/2010), Dyspnea, GERD (gastroesophageal reflux disease), Gout (10/28/2010), Heart murmur, History of cardiac catheterization (2006), History of cardiovascular stress test (  09/23/2009), History of echocardiogram (05/27/2008), Hyperlipidemia, Hyperlipidemia LDL goal <100 (10/28/2010), Hypertension (10/28/2010), IPF (idiopathic pulmonary fibrosis) (HCC), Mild vitamin D deficiency (02/22/2011), Obesity (BMI 30-39.9) (10/28/2010), Osteoarthritis (10/28/2010), Personal history of colonic polyps (10/28/2010), Shortness of breath, Syncope (2013), and Vitamin B 12 deficiency (10/28/2010).   Surgical History:   Past Surgical History:  Procedure Laterality Date   BACK SURGERY  2006   DDD   BREAST BIOPSY Right    neg   CARDIAC CATHETERIZATION  2006   EYE SURGERY Bilateral    Cataract Extraction with IOL   JOINT REPLACEMENT Left    Left Total Hip Replacement   JOINT REPLACEMENT     RIGHT 2005; LEFT 2 X 2004, 2011   REPLACEMENT TOTAL KNEE Left    REVISION TOTAL KNEE  ARTHROPLASTY Left    Dr. Ernest Pine   TONSILLECTOMY     TOTAL HIP ARTHROPLASTY Right 12/12/2016   Procedure: TOTAL HIP ARTHROPLASTY ANTERIOR APPROACH;  Surgeon: Kennedy Bucker, MD;  Location: [facility] ORS;  Service: Orthopedics;  Laterality: Right;   TOTAL HIP ARTHROPLASTY Left 12/26/2013   Procedure: ARTHROPLASTY HIP TOTAL; Surgeon: Christell Faith, MD; Location[facility] OR; Service: Orthopedics; Laterality: left,    TOTAL KNEE ARTHROPLASTY     both knees     Social History:   reports that she quit smoking about 30 years ago. Her smoking use included cigarettes. She has a 2.50 pack-year smoking history. She has never used smokeless tobacco. She reports that she does not drink alcohol and does not use drugs.   Family History:  Her family history includes Breast cancer in her cousin; Breast cancer (age of onset: 91) in her mother; Diabetes in her mother; Stroke in her brother and mother.   Allergies Allergies  Allergen Reactions   Morphine And Related Nausea And Vomiting     Home Medications  Prior to Admission medications   Medication Sig Start Date End Date Taking? Authorizing Provider  albuterol (VENTOLIN HFA) 108 (90 Base) MCG/ACT inhaler Inhale 1-2 puffs into the lungs See admin instructions. Give 1 puff inhaled via mask four times a day and also may give 2 puffs every 6 hours as needed for wheezing or shortness of breath   Yes [provider]  allopurinol (ZYLOPRIM) 300 MG tablet Take 300 mg by mouth daily.    Yes [provider]  AMINO ACIDS-PROTEIN HYDROLYS PO Take 30 mLs by mouth See admin instructions. Give 3mL by mouth twice a day for wound healing until 02/07/21 23:59   Yes [provider]  benzonatate (TESSALON) 100 MG capsule Take 100 mg by mouth 3 (three) times daily as needed for cough.   Yes [provider]  cholecalciferol (VITAMIN D) 1000 units tablet Take 1,000 Units by mouth daily.   Yes [provider]  Cinnamon 500 MG capsule  Take 500 mg by mouth daily.   Yes [provider]  docusate sodium (COLACE) 100 MG capsule Take 1 capsule (100 mg total) by mouth 2 (two) times daily. 12/14/16  Yes Evon Slack, PA-C  Fluticasone-Salmeterol (ADVAIR) 500-50 MCG/DOSE AEPB Inhale 1 puff into the lungs every 12 (twelve) hours.   Yes [provider]  furosemide (LASIX) 20 MG tablet Take 1 tablet (20 mg total) by mouth daily as needed for edema. 01/03/21  Yes Arrien, York Ram, MD  gabapentin (NEURONTIN) 300 MG capsule Take 300 mg by mouth 3 (three) times daily. Given at 0800, 1400, 2000 03/02/18  Yes [provider]  INCRUSE ELLIPTA 62.5  MCG/INH AEPB Inhale 1 puff into the lungs daily. 12/01/20  Yes [provider]  liver oil-zinc oxide (DESITIN) 40 % ointment Apply to buttocks topically three times a day for wound prevention   Yes [provider]  loratadine (CLARITIN) 10 MG tablet Take 10 mg by mouth daily.   Yes [provider]  magnesium oxide (MAG-OX) 400 MG tablet Take 1 tablet by mouth daily. 07/21/20  Yes [provider]  metFORMIN (GLUCOPHAGE) 850 MG tablet Take 1 tablet (850 mg total) by mouth 2 (two) times daily with a meal. 12/19/20 12/19/21 Yes Adhikari, Willia Craze, MD  metoprolol succinate (TOPROL-XL) 25 MG 24 hr tablet Take 1 tablet (25 mg total) by mouth daily. 12/19/20  Yes Burnadette Pop, MD  Multiple Vitamin (MULTI-VITAMIN) tablet Take 1 tablet by mouth daily.   Yes [provider]  Nystatin (GERHARDT'S BUTT CREAM) CREA Apply 1 application topically 3 (three) times daily. 01/03/21  Yes Arrien, York Ram, MD  Omega-3 Fatty Acids (FISH OIL) 1000 MG CAPS Take 1,000 mg by mouth daily.   Yes [provider]  omeprazole (PRILOSEC) 20 MG capsule Take 20 mg by mouth daily. 11/05/20  Yes [provider]  polyethylene glycol (MIRALAX / GLYCOLAX) 17 g packet Take 17 g by mouth daily. 12/19/20  Yes Burnadette Pop, MD  rosuvastatin (CRESTOR)  40 MG tablet Take 40 mg by mouth daily. 09/17/20  Yes [provider]  vitamin B-12 (CYANOCOBALAMIN) 1000 MCG tablet Take 1,000 mcg by mouth daily.   Yes [provider]  Blood Glucose Monitoring Suppl (GLUCOCOM BLOOD GLUCOSE MONITOR) DEVI See admin instructions. 04/23/19   [provider]  Vitamin D, Ergocalciferol, (DRISDOL) 1.25 MG (50000 UNIT) CAPS capsule Take 1 capsule (50,000 Units total) by mouth every 7 (seven) days. Patient taking differently: Take 50,000 Units by mouth every 7 (seven) days. On Wednesday 12/22/20   Burnadette Pop, MD  enoxaparin (LOVENOX) 40 MG/0.4ML injection Inject 0.4 mLs (40 mg total) into the skin daily. 12/15/16 03/25/19  Evon Slack, PA-C  pregabalin (LYRICA) 50 MG capsule Take 1 capsule (50 mg total) by mouth 3 (three) times daily. 12/18/16 03/25/19  Lorenso Quarry, NP  ranitidine (ZANTAC) 300 MG capsule Take 300 mg by mouth every evening.  03/25/19  [provider]  simvastatin (ZOCOR) 40 MG tablet Take 40 mg by mouth daily at 6 PM.  03/25/19  [provider]     Critical care time: 45 min.     Steffanie Dunn, DO 01/12/21 3:23 AM [facility]

## 2021-01-12 NOTE — ED Notes (Signed)
Patients daughter would like a call back with an update: [place] [phone]

## 2021-01-13 ENCOUNTER — Inpatient Hospital Stay (HOSPITAL_COMMUNITY): Payer: Medicare HMO

## 2021-01-13 ENCOUNTER — Telehealth: Payer: Self-pay | Admitting: Acute Care

## 2021-01-13 DIAGNOSIS — I5032 Chronic diastolic (congestive) heart failure: Secondary | ICD-10-CM

## 2021-01-13 DIAGNOSIS — R0603 Acute respiratory distress: Secondary | ICD-10-CM

## 2021-01-13 DIAGNOSIS — J9620 Acute and chronic respiratory failure, unspecified whether with hypoxia or hypercapnia: Secondary | ICD-10-CM

## 2021-01-13 DIAGNOSIS — I214 Non-ST elevation (NSTEMI) myocardial infarction: Secondary | ICD-10-CM

## 2021-01-13 DIAGNOSIS — J9601 Acute respiratory failure with hypoxia: Secondary | ICD-10-CM | POA: Diagnosis not present

## 2021-01-13 DIAGNOSIS — L8990 Pressure ulcer of unspecified site, unspecified stage: Secondary | ICD-10-CM | POA: Insufficient documentation

## 2021-01-13 DIAGNOSIS — I110 Hypertensive heart disease with heart failure: Secondary | ICD-10-CM

## 2021-01-13 DIAGNOSIS — J84112 Idiopathic pulmonary fibrosis: Secondary | ICD-10-CM

## 2021-01-13 LAB — LEGIONELLA PNEUMOPHILA SEROGP 1 UR AG: L. pneumophila Serogp 1 Ur Ag: NEGATIVE

## 2021-01-13 LAB — ECHOCARDIOGRAM COMPLETE
AR max vel: 3.25 cm2
AV Area VTI: 3.19 cm2
AV Area mean vel: 3.17 cm2
AV Mean grad: 10.7 mmHg
AV Peak grad: 19.1 mmHg
Ao pk vel: 2.19 m/s
Area-P 1/2: 3.91 cm2
Calc EF: 57.7 %
Height: 67 in
S' Lateral: 2.8 cm
Single Plane A2C EF: 64.6 %
Single Plane A4C EF: 49.8 %
Weight: 2472.68 oz

## 2021-01-13 LAB — GLUCOSE, CAPILLARY
Glucose-Capillary: 118 mg/dL — ABNORMAL HIGH (ref 70–99)
Glucose-Capillary: 125 mg/dL — ABNORMAL HIGH (ref 70–99)
Glucose-Capillary: 190 mg/dL — ABNORMAL HIGH (ref 70–99)
Glucose-Capillary: 203 mg/dL — ABNORMAL HIGH (ref 70–99)
Glucose-Capillary: 250 mg/dL — ABNORMAL HIGH (ref 70–99)
Glucose-Capillary: 115 mg/dL — ABNORMAL HIGH (ref 70–99)

## 2021-01-13 LAB — CBC
HCT: 27.4 % — ABNORMAL LOW (ref 36.0–46.0)
Hemoglobin: 8.6 g/dL — ABNORMAL LOW (ref 12.0–15.0)
MCH: 29.3 pg (ref 26.0–34.0)
MCHC: 31.4 g/dL (ref 30.0–36.0)
MCV: 93.2 fL (ref 80.0–100.0)
Platelets: 279 10*3/uL (ref 150–400)
RBC: 2.94 MIL/uL — ABNORMAL LOW (ref 3.87–5.11)
RDW: 18.7 % — ABNORMAL HIGH (ref 11.5–15.5)
WBC: 9.1 10*3/uL (ref 4.0–10.5)
nRBC: 0 % (ref 0.0–0.2)

## 2021-01-13 LAB — MAGNESIUM
Magnesium: 2 mg/dL (ref 1.7–2.4)
Magnesium: 2 mg/dL (ref 1.7–2.4)

## 2021-01-13 LAB — COMPREHENSIVE METABOLIC PANEL
ALT: 9 U/L (ref 0–44)
AST: 28 U/L (ref 15–41)
Albumin: 2.3 g/dL — ABNORMAL LOW (ref 3.5–5.0)
Alkaline Phosphatase: 69 U/L (ref 38–126)
Anion gap: 7 (ref 5–15)
BUN: 27 mg/dL — ABNORMAL HIGH (ref 8–23)
CO2: 23 mmol/L (ref 22–32)
Calcium: 8.9 mg/dL (ref 8.9–10.3)
Chloride: 106 mmol/L (ref 98–111)
Creatinine, Ser: 1.93 mg/dL — ABNORMAL HIGH (ref 0.44–1.00)
GFR, Estimated: 26 mL/min — ABNORMAL LOW (ref >60)
Glucose, Bld: 104 mg/dL — ABNORMAL HIGH (ref 70–99)
Potassium: 3.6 mmol/L (ref 3.5–5.1)
Sodium: 136 mmol/L (ref 135–145)
Total Bilirubin: 0.5 mg/dL (ref 0.3–1.2)
Total Protein: 6.2 g/dL — ABNORMAL LOW (ref 6.5–8.1)

## 2021-01-13 LAB — PROCALCITONIN: Procalcitonin: 2.44 ng/mL

## 2021-01-13 LAB — PHOSPHORUS: Phosphorus: 3.9 mg/dL (ref 2.5–4.6)

## 2021-01-13 LAB — TROPONIN I (HIGH SENSITIVITY): Troponin I (High Sensitivity): 163 ng/L (ref <18)

## 2021-01-13 MED ORDER — ASPIRIN EC 81 MG PO TBEC
81.0000 mg | DELAYED_RELEASE_TABLET | Freq: Every day | ORAL | Status: DC
  Administered 2021-01-14 – 2021-01-17 (×4): 81 mg via ORAL
  Filled 2021-01-13 (×4): qty 1

## 2021-01-13 MED ORDER — HEPARIN (PORCINE) 25000 UT/250ML-% IV SOLN: 10.0000 [IU]/kg/h | INTRAVENOUS | Status: DC

## 2021-01-13 MED ORDER — MAGNESIUM OXIDE -MG SUPPLEMENT 400 (240 MG) MG PO TABS
400.0000 mg | ORAL_TABLET | Freq: Every day | ORAL | Status: DC
  Administered 2021-01-13 – 2021-01-17 (×5): 400 mg via ORAL
  Filled 2021-01-13 (×5): qty 1

## 2021-01-13 MED ORDER — PREDNISONE 20 MG PO TABS
40.0000 mg | ORAL_TABLET | Freq: Every day | ORAL | Status: DC
  Administered 2021-01-14 – 2021-01-15 (×2): 40 mg via ORAL
  Filled 2021-01-13 (×2): qty 2

## 2021-01-13 MED ORDER — ASPIRIN 325 MG PO TABS
325.0000 mg | ORAL_TABLET | Freq: Once | ORAL | Status: AC
  Administered 2021-01-13: 325 mg via ORAL
  Filled 2021-01-13: qty 1

## 2021-01-13 MED ORDER — HEPARIN (PORCINE) 25000 UT/250ML-% IV SOLN
850.0000 [IU]/h | INTRAVENOUS | Status: DC
  Administered 2021-01-13: 850 [IU]/h via INTRAVENOUS
  Filled 2021-01-13: qty 250

## 2021-01-13 MED ORDER — FUROSEMIDE 10 MG/ML IJ SOLN
20.0000 mg | Freq: Once | INTRAMUSCULAR | Status: AC
  Administered 2021-01-13: 20 mg via INTRAVENOUS
  Filled 2021-01-13: qty 2

## 2021-01-13 MED ORDER — HEPARIN BOLUS VIA INFUSION
2500.0000 [IU] | Freq: Once | INTRAVENOUS | Status: AC
  Administered 2021-01-13: 2500 [IU] via INTRAVENOUS
  Filled 2021-01-13: qty 2500

## 2021-01-13 NOTE — Progress Notes (Addendum)
NAME:  Sierra Guzman, MRN:  [id number], DOB:  Nov 15, 1940, LOS: 1 ADMISSION DATE:  01/11/2021, CONSULTATION DATE:  01/12/21 REFERRING MD:  Clovis Pu, CHIEF COMPLAINT:  hypoxia   History of Present Illness:  Sierra Guzman is an 80 y/o woman with a history of pulmonary fibrosis previously evaluated and cared for at [facility] who presents to ED from rehab after 2 recent hospitalizations.  She feels like her breathing is at baseline, but she was sent in by [facility] due to perceived increased shortness of breath.  She is on 2 L of oxygen at rest and 6 L of oxygen with activity at baseline since her most recent outpatient walk test in July 2022.  Per her outpatient pulmonary notes, she carries a diagnosis to postinflammatory interstitial lung disease ans asthma.  In her recent hospitalization at [facility] she was treated with steroids, which been tapered off. Until recently she was on pirfenidone 2 tabs TID, which was stopped during her hospitalization in September. She was on a prednisone taper as an outpatient in July and had it extended in August 2022. She is on Advair and had Incruse added in late July. She is on PPI for GERD. She denies recent URI symptoms, fevers, chills, n/v/d, rashes. She has no history of VTE.  Pertinent  Medical History  ILD, Chronic respiratory failure with hypoxia- 2L at rest, 6L with activity, HTN, HLD, GERD, Gout  Significant Hospital Events: Including procedures, antibiotic start and stop dates in addition to other pertinent events   10/5 admitted.  Started on systemic steroids and empiric antibiotics. 10/6 continue to have productive cough that results in mild hypoxia   Interim History / Subjective:  States she feels well apart from coughing episode  No acute events overnight   Objective   Blood pressure (!) 112/52, pulse 90, temperature 98 F (36.7 C), temperature source Oral, resp. rate 15, height 5\' 7"  (1.702 m), weight 70.1 kg, SpO2 99 %.        Intake/Output  Summary (Last 24 hours) at 01/13/2021 0850 Last data filed at 01/13/2021 0300 Gross per 24 hour  Intake 929.71 ml  Output 400 ml  Net 529.71 ml   Filed Weights   01/11/21 2300 01/13/21 0500  Weight: 73.2 kg 70.1 kg    Examination: General: Acute on chronically ill appearing elderly female lying in bed, in NAD HEENT: [place]/AT, MM pink/moist, PERRL,  Neuro: Alert and oriented x3, non-focal  CV: s1s2 regular rate and rhythm, no murmur, rubs, or gallops,  PULM:  Diminished air entry bilaterally, with faint rhonchi, productive cough, on 3L [place] GI: soft, bowel sounds active in all 4 quadrants, non-tender, non-distended, tolerating oral diet  Extremities: warm/dry, no edema  Skin: no rashes or lesions  Resolved Hospital Problem list   Hyponatremia  Assessment & Plan:  Acute on chronic hypoxic respiratory failure-suspect this is due to an ILD flare vs CAP, present on admit    -Pirfenidone recently stopped.  She has required steroids in July through August and again in September during her hospital admission for acute flares.  Elevated CRP and sed rate in September suggest this is an inflammatory process, and clinically it seems to improve with steroids. -ustrep neg -Procalcitonin 2.54 > 2.44 Asthma -Not acutely exacerbated P: Continue steroids with plan to slowly taper over 2 weeks, if ECHO more consistent with CHF could consider shorter taper over 1 week Continue BDs and ICS Encourage pulmonary hygiene  Mobilize as able  Continue CAP coverage  Follow ECHO  Patient would like to resume Pirfenidone (it was stopped during a recent admit at [facility]) please resume this on discharge. She will need to take 1 tablet TID for one week with up titration to 2 tablets TID thereafter Will need to follow up with pulmonary clinic in 2 weeks will arrange today   Hx of diastolic congestive heart failure  -ECHO 12/09/2020 with EF 60-65 with grade 1 diastolic dysfunction  Elevated troponin, present on admit   -EKG without ischemic changes.  -Trop 466 > 474 >210 > 163 -CTA negative for PE P: Remains chest pain free  Supportive care  Limited ECHO pending  Repeat EKG prior to transfer   AKI on CKD 3B FENA 1.2 so could be prerenal OR ATN -Baseline creatinine 1.0-1.2, creatinine on admission 1.79 P: Follow renal function  Monitor urine output Trend Bmet Avoid nephrotoxins Ensure adequate renal perfusion  Enteral hydration  No further diuretics at this time   GERD P: PPI  Gout P: Continue home Allopurinol   HLD P: Continue Crestor and Fish oil   Chronic anemia P: Trend CBC  Transfuse per protocol  Hgb gaol > 7  Diabetes with hyperglycemia, type 2 - Hold medications include metformin P: Continue SSI  Home Metformin on hold    Best Practice (   Diet/type: Regular consistency (see orders) DVT prophylaxis: systemic heparin GI prophylaxis: PPI Lines: N/A Foley:  N/A Code Status:  full code Last date of multidisciplinary goals of care discussion:: Did not want elective intubation, but ok with it in the context of cardiac arrest; she would want full resuscitation. Daughter is surrogate Management consultant.  Critical care time: NA  Sierra Guzman D. Tiburcio Pea, NP-C [facility] Personal contact information can be found on Amion  01/13/2021, 9:11 AM

## 2021-01-13 NOTE — Consult Note (Addendum)
Cardiology Consultation:   Patient ID: Sierra Guzman MRN: [id number]; DOB: 18-Jun-1940  Admit date: 01/11/2021 Date of Consult: 01/13/2021 EZRA MARQUESSstem, Provider Not In   [facility] Providers Cardiologist:  Maisie Fus, MD - new, cardiology care has been at [facility]     Patient Profile:   Sierra Guzman is a 80 y.o. 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 Marchelle Gearing.  History of Present Illness:   Ms. [name] gets her care primarily from [facility]. 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 She has a history of CAD with 50% LAD and 50% D1 stenosis in th 2006, nuclear stress test in 2017 was nonischemic. She is on BB for PACs. She was hospitalized July 2022 for breathing issues and was started on a steroid taper.   She presented to [facility] from her facility [facility]. She stated her breathing was at baseline, but there was perceived shortness of breath by staff. She is on 2L O2 at rest and 6L with walking. She reports painting in the morning and then working with OT and PT back to back. She was worn out and stopped PT early to rest. Staff noted increased work of breathing and called EMS. She was admitted and started on steroids and empiric antibiotics.   She required steroids in July, Aug and again in Sept. Elevated CRP and sed rate suggest inflammatory process. Recently taken off of pirfenidone, will be resumed at discharge.  EKG with new TWI inferior and anterior leads. HS troponin elevated and trending down.  Has received 20 mg IV lasix x 1.   CTA negative for PE, but does show aortic and coronary calcifications.    Past Medical History:  Diagnosis Date   Anemia 10/28/2010    unspecified   Anxiety    Aortic valve sclerosis    Arthritis    Asthma    Chronic diastolic CHF (congestive heart failure) (HCC)    Echo 12/2020 = nl EF, Grade 1 DD, normal RV function, no mention of pulm art pressure though   Chronic kidney disease    Chronic Kidney Disease---Stage III   Coronary artery disease 10/28/2010   DDD (degenerative disc disease), lumbar 2006   Diabetes type 2, controlled (HCC) 10/28/2010   Dyspnea    GERD (gastroesophageal reflux disease)    Gout 10/28/2010   Heart murmur    History of cardiac catheterization 2006   LAD 50%, D1 50% rest normal.    History of cardiovascular stress test 09/23/2009    lexiscan Normal Cardiolite test, EF 61%. LV size and function - normal   History of echocardiogram 05/27/2008   EF 55%; Mild aortic insufficiency with minimal aortic sclerosis. L vent normal.    Hyperlipidemia    Hyperlipidemia LDL goal <100 10/28/2010   managed by Dr. Burr Medico   Hypertension 10/28/2010   IPF (idiopathic pulmonary fibrosis) (HCC)    Progressive, with UIP   Mild vitamin D deficiency 02/22/2011   Obesity (BMI 30-39.9) 10/28/2010   unspecified   Osteoarthritis 10/28/2010   bilateral knees, left hip   Personal history of colonic polyps 10/28/2010   Shortness of breath    Syncope 2013   Vitamin B 12 deficiency 10/28/2010  Past Surgical History:  Procedure Laterality Date   BACK SURGERY  2006   DDD   BREAST BIOPSY Right    neg   CARDIAC CATHETERIZATION  2006   EYE SURGERY Bilateral    Cataract Extraction with IOL   JOINT REPLACEMENT Left    Left Total Hip Replacement   JOINT REPLACEMENT     RIGHT 2005; LEFT 2 X 2004, 2011   REPLACEMENT TOTAL KNEE Left    REVISION TOTAL KNEE ARTHROPLASTY Left    Dr. Ernest Pine   TONSILLECTOMY     TOTAL HIP ARTHROPLASTY Right 12/12/2016   Procedure: TOTAL HIP ARTHROPLASTY ANTERIOR APPROACH;  Surgeon: Kennedy Bucker, MD;  Location: [facility] ORS;  Service: Orthopedics;  Laterality: Right;   TOTAL HIP  ARTHROPLASTY Left 12/26/2013   Procedure: ARTHROPLASTY HIP TOTAL; Surgeon: Christell Faith, MD; Location[facility]; Service: Orthopedics; Laterality: left,    TOTAL KNEE ARTHROPLASTY     both knees     Home Medications:  Prior to Admission medications   Medication Sig Start Date End Date Taking? Authorizing Provider  albuterol (VENTOLIN HFA) 108 (90 Base) MCG/ACT inhaler Inhale 1-2 puffs into the lungs See admin instructions. Give 1 puff inhaled via mask four times a day and also may give 2 puffs every 6 hours as needed for wheezing or shortness of breath   Yes [provider]  allopurinol (ZYLOPRIM) 300 MG tablet Take 300 mg by mouth daily.    Yes [provider]  AMINO ACIDS-PROTEIN HYDROLYS PO Take 30 mLs by mouth See admin instructions. Give 20mL by mouth twice a day for wound healing until 02/07/21 23:59   Yes [provider]  benzonatate (TESSALON) 100 MG capsule Take 100 mg by mouth 3 (three) times daily as needed for cough.   Yes [provider]  cholecalciferol (VITAMIN D) 1000 units tablet Take 1,000 Units by mouth daily.   Yes [provider]  Cinnamon 500 MG capsule Take 500 mg by mouth daily.   Yes [provider]  docusate sodium (COLACE) 100 MG capsule Take 1 capsule (100 mg total) by mouth 2 (two) times daily. 12/14/16  Yes Evon Slack, PA-C  Fluticasone-Salmeterol (ADVAIR) 500-50 MCG/DOSE AEPB Inhale 1 puff into the lungs every 12 (twelve) hours.   Yes [provider]  furosemide (LASIX) 20 MG tablet Take 1 tablet (20 mg total) by mouth daily as needed for edema. 01/03/21  Yes Arrien, York Ram, MD  gabapentin (NEURONTIN) 300 MG capsule Take 300 mg by mouth 3 (three) times daily. Given at 0800, 1400, 2000 03/02/18  Yes [provider]  INCRUSE ELLIPTA 62.5 MCG/INH AEPB Inhale 1 puff into the lungs daily. 12/01/20  Yes [provider]  liver oil-zinc oxide (DESITIN) 40 % ointment Apply to  buttocks topically three times a day for wound prevention   Yes [provider]  loratadine (CLARITIN) 10 MG tablet Take 10 mg by mouth daily.   Yes [provider]  magnesium oxide (MAG-OX) 400 MG tablet Take 1 tablet by mouth daily. 07/21/20  Yes [provider]  metFORMIN (GLUCOPHAGE) 850 MG tablet Take 1 tablet (850 mg total) by mouth 2 (two) times daily with a meal. 12/19/20 12/19/21 Yes Adhikari, Willia Craze, MD  metoprolol succinate (TOPROL-XL) 25 MG 24 hr tablet Take 1 tablet (25 mg total) by mouth daily. 12/19/20  Yes Burnadette Pop, MD  Multiple Vitamin (MULTI-VITAMIN) tablet Take 1 tablet by mouth daily.   Yes [provider]  Nystatin [facility]  BUTT CREAM) CREA Apply 1 application topically 3 (three) times daily. 01/03/21  Yes Arrien, York Ram, MD  Omega-3 Fatty Acids (FISH OIL) 1000 MG CAPS Take 1,000 mg by mouth daily.   Yes [provider]  omeprazole (PRILOSEC) 20 MG capsule Take 20 mg by mouth daily. 11/05/20  Yes [provider]  polyethylene glycol (MIRALAX / GLYCOLAX) 17 g packet Take 17 g by mouth daily. 12/19/20  Yes Burnadette Pop, MD  rosuvastatin (CRESTOR) 40 MG tablet Take 40 mg by mouth daily. 09/17/20  Yes [provider]  vitamin B-12 (CYANOCOBALAMIN) 1000 MCG tablet Take 1,000 mcg by mouth daily.   Yes [provider]  Blood Glucose Monitoring Suppl (GLUCOCOM BLOOD GLUCOSE MONITOR) DEVI See admin instructions. 04/23/19   [provider]  Vitamin D, Ergocalciferol, (DRISDOL) 1.25 MG (50000 UNIT) CAPS capsule Take 1 capsule (50,000 Units total) by mouth every 7 (seven) days. Patient taking differently: Take 50,000 Units by mouth every 7 (seven) days. On Wednesday 12/22/20   Burnadette Pop, MD  enoxaparin (LOVENOX) 40 MG/0.4ML injection Inject 0.4 mLs (40 mg total) into the skin daily. 12/15/16 03/25/19  Evon Slack, PA-C  pregabalin (LYRICA) 50 MG capsule Take 1 capsule (50 mg total) by mouth 3  (three) times daily. 12/18/16 03/25/19  Lorenso Quarry, NP  ranitidine (ZANTAC) 300 MG capsule Take 300 mg by mouth every evening.  03/25/19  [provider]  simvastatin (ZOCOR) 40 MG tablet Take 40 mg by mouth daily at 6 PM.  03/25/19  [provider]    Inpatient Medications: Scheduled Meds:  allopurinol  300 mg Oral Daily   Chlorhexidine Gluconate Cloth  6 each Topical Daily   cholecalciferol  1,000 Units Oral Daily   docusate sodium  100 mg Oral BID   fluticasone furoate-vilanterol  1 puff Inhalation Daily   gabapentin  300 mg Oral 3 times per day   heparin injection (subcutaneous)  5,000 Units Subcutaneous Q8H   insulin aspart  3-9 Units Subcutaneous Q4H   loratadine  10 mg Oral Daily   magnesium oxide  400 mg Oral Daily   metoprolol succinate  25 mg Oral Daily   multivitamin with minerals  1 tablet Oral Daily   omega-3 acid ethyl esters  1 g Oral Daily   pantoprazole  40 mg Oral Daily   polyethylene glycol  17 g Oral Daily   [START ON 01/14/2021] predniSONE  40 mg Oral Q breakfast   rosuvastatin  40 mg Oral Daily   umeclidinium bromide  1 puff Inhalation Daily   Continuous Infusions:  azithromycin Stopped (01/12/21 2218)   cefTRIAXone (ROCEPHIN)  IV Stopped (01/13/21 0111)   PRN Meds: acetaminophen, albuterol, benzonatate, docusate sodium, polyethylene glycol  Allergies:    Allergies  Allergen Reactions   Morphine And Related Nausea And Vomiting    Social History:   Social History   Socioeconomic History   Marital status: Married    Spouse name: Not on file   Number of children: Not on file   Years of education: Not on file   Highest education level: Not on file  Occupational History   Not on file  Tobacco Use   Smoking status: Former    Packs/day: 0.25    Years: 10.00    Pack years: 2.50    Types: Cigarettes    Quit date: 08/18/1990    Years since quitting: 30.4   Smokeless tobacco: Never  Vaping Use   Vaping Use: Never used  Substance and Sexual Activity   Alcohol use: No   Drug use: No   Sexual activity: Not Currently  Other Topics Concern   Not on file  Social History Narrative   Not on file   Social Determinants of Health   Financial Resource Strain: Not on file  Food Insecurity: Not on file  Transportation Needs: Not on file  Physical Activity: Not on file  Stress: Not on file  Social Connections: Not on file  Intimate Partner Violence: Not on file    Family History:    Family History  Problem Relation Age of Onset   Breast cancer Mother 72   Diabetes Mother    Stroke Mother    Stroke Brother    Breast cancer Cousin        pat cousin     ROS:  Please see the history of present illness.   All other ROS reviewed and negative.     Physical Exam/Data:   Vitals:   01/13/21 0829 01/13/21 0900 01/13/21 0929 01/13/21 1000  BP:  (!) 101/59 (!) 108/53 (!) 98/48  Pulse:  94 95 94  Resp:    (!) 24  Temp:      TempSrc:      SpO2: 99% 98%  100%  Weight:      Height:        Intake/Output Summary (Last 24 hours) at 01/13/2021 1136 Last data filed at 01/13/2021 0300 Gross per 24 hour  Intake 825.37 ml  Output 400 ml  Net 425.37 ml   Last 3 Weights 01/13/2021 01/11/2021 12/28/2020  Weight (lbs) 154 lb 8.7 oz 161 lb 6 oz 161 lb 6 oz  Weight (kg) 70.1 kg 73.2 kg 73.2 kg     Body mass index is 24.2 kg/m.  General:  elderly female in NAD, on O2 HEENT: normal Neck: no JVD Vascular: No carotid bruits; Distal pulses 2+ bilaterally Cardiac:  normal S1, S2; irregular rhythm, regular rate Lungs:  crackles, rhonchi throughout, inspiratory velcro crackles R  > L base Abd: soft, nontender, no hepatomegaly  Ext: trace B LE edema Musculoskeletal:  No deformities, BUE and BLE strength normal and equal Skin: warm and dry  Neuro:  CNs 2-12 intact, no focal abnormalities noted Psych:  Normal affect   EKG:  The EKG was personally reviewed and demonstrates:  sinus tachycardia with HR 106, TWI  inferior and anterior leads Telemetry:  Telemetry was personally reviewed and demonstrates:  sinus to sinus tachycardia 60-110s  Relevant CV Studies:  Limited echo 01/13/21:   Echo 12/09/20: 1. Left ventricular ejection fraction, by estimation, is 60 to 65%. The  left ventricle has normal function. The left ventricle has no regional  wall motion abnormalities. Left ventricular diastolic parameters are  consistent with Grade I diastolic  dysfunction (impaired relaxation). The average left ventricular global  longitudinal strain is -16.6 %. The global longitudinal strain is normal.   2. Right ventricular systolic function is normal. The right ventricular  size is normal.   3. The aortic valve is normal in structure. Aortic valve regurgitation is  not visualized. Mild to moderate aortic valve sclerosis/calcification is  present, without any evidence of aortic stenosis.    Holter Monitor: 01/20/20 Conclusions: Adequate quality study. Predominant rhythm is sinus with average rate of 71 bpm. No pauses greater than 2 seconds or episodes of AV block. Ventricular ectopy occurred 1.9% of the time. This consisted of predominantly of isolated PVCs with rare ventricular couplets. Supraventricular ectopy occurred 1.6%  of the time. This consisted of predominantly isolated PACs. There were occasional atrial runs with the longest consisting of 9 beats at approximately 98 bpm and the fastest was 4 beats long at 160 bpm. There was 1 button activation with no symptoms recorded. This corresponded to normal sinus rhythm.  Nuclear Stress Test: 07/08/19  Myocardial perfusion imaging is Normal.  Summed severity score is normal.  Artifacts noted: breast attenuation.  Overall left ventricular systolic function was Normal without regional wall motion abnormalities (see above).  Compared to the prior study from [facility] 06/13/2015, the current study reveals no change.  Nuclear Stress Test: 06/13/15  Myocardial  perfusion imaging is Normal.  Summed severity score is normal.  Artifacts noted: none.  Overall left ventricular systolic function was Normal without regional wall motion abnormalities (see above).  Compared to the prior study from September 16, 2013, the current study reveals no significant change.  Echocardiogram: 09/19/17 NORMAL LEFT VENTRICULAR SYSTOLIC FUNCTION NORMAL LA PRESSURES WITH NORMAL DIASTOLIC FUNCTION NORMAL RIGHT VENTRICULAR SYSTOLIC FUNCTION VALVULAR REGURGITATION: MILD AR, MILD MR, MILD PR, TRIVIAL TR NO VALVULAR STENOSIS INSUFFICIENT TR TO ESTIMATE RVSP; RVSP IS AT LEAST    Laboratory Data:  High Sensitivity Troponin:   Recent Labs  Lab 12/21/20 2215 01/12/21 0000 01/12/21 0205 01/12/21 1748 01/13/21 0553  TROPONINIHS 9 466* 474* 210* 163*     Chemistry Recent Labs  Lab 01/12/21 0000 01/12/21 0433 01/12/21 1748 01/13/21 0247 01/13/21 0553  NA 132* 135  --  136  --   K 4.2 3.8  --  3.6  --   CL 104 105  --  106  --   CO2 19* 21*  --  23  --   GLUCOSE 152* 110*  --  104*  --   BUN 24* 22  --  27*  --   CREATININE 1.76* 1.74*  --  1.93*  --   CALCIUM 8.7* 8.3*  --  8.9  --   MG  --   --  1.9 2.0 2.0  GFRNONAA 29* 29*  --  26*  --   ANIONGAP 9 9  --  7  --     Recent Labs  Lab 01/13/21 0247  PROT 6.2*  ALBUMIN 2.3*  AST 28  ALT 9  ALKPHOS 69  BILITOT 0.5   Lipids No results for input(s): CHOL, TRIG, HDL, LABVLDL, LDLCALC, CHOLHDL in the last 168 hours.  Hematology Recent Labs  Lab 01/12/21 0000 01/12/21 0433 01/13/21 0247  WBC 11.7* 11.4* 9.1  RBC 3.45* 3.39* 2.94*  HGB 9.9* 9.8* 8.6*  HCT 32.0* 31.7* 27.4*  MCV 92.8 93.5 93.2  MCH 28.7 28.9 29.3  MCHC 30.9 30.9 31.4  RDW 18.8* 18.7* 18.7*  PLT 293 295 279   Thyroid No results for input(s): TSH, FREET4 in the last 168 hours.  BNP Recent Labs  Lab 01/12/21 0000  BNP 1,114.8*    DDimer  Recent Labs  Lab 01/12/21 0000  DDIMER 4.44*     Radiology/Studies:  CT  Angio Chest Pulmonary Embolism (PE) W or WO Contrast  Result Date: 01/12/2021 CLINICAL DATA:  Shortness of breath. EXAM: CT ANGIOGRAPHY CHEST WITH CONTRAST TECHNIQUE: Multidetector CT imaging of the chest was performed using the standard protocol during bolus administration of intravenous contrast. Multiplanar CT image reconstructions and MIPs were obtained to evaluate the vascular anatomy. CONTRAST:  38mL OMNIPAQUE IOHEXOL 350 MG/ML SOLN COMPARISON:  December 06, 2020 FINDINGS: Cardiovascular: There is moderate to marked severity calcification of  the aortic arch, without evidence of aortic aneurysm or dissection. The subsegmental pulmonary arteries are limited in evaluation secondary to patient motion and areas of overlying artifact. This is most prominent within the bilateral lung bases. No evidence of pulmonary embolism. Normal heart size with marked severity coronary artery calcification. No pericardial effusion. Mediastinum/Nodes: There is moderate severity mediastinal and right hilar lymphadenopathy with a 3.4 cm x 2.4 cm right paratracheal lymph node noted. This measures approximately 1.5 cm on the prior study. Thyroid gland, trachea, and esophagus demonstrate no significant findings. Lungs/Pleura: Stable, marked severity diffuse interstitial lung disease is noted bilaterally. This is most prominent within the bilateral lower lobes. Associated bronchiectatic changes are noted. Mild to moderate severity superimposed infiltrates are suspected within the posterior aspects of the bilateral upper lobes. There is no evidence of a pleural effusion or pneumothorax. Upper Abdomen: There is a large, stable gastric hernia. A stable 3.3 cm diameter simple cyst is seen within the upper pole of the left kidney. Musculoskeletal: Multilevel degenerative changes seen throughout the thoracic spine. Review of the MIP images confirms the above findings. IMPRESSION: 1. Marked severity chronic interstitial lung disease. 2. Mild to  moderate severity superimposed bilateral upper lobe infiltrates. 3. No evidence of pulmonary embolism. 4. Large, stable gastric hernia. 5. Stable 3.3 cm diameter simple cyst within the upper pole of the left kidney. 6. Aortic atherosclerosis. Aortic Atherosclerosis (ICD10-I70.0). Electronically Signed   By: Aram Candela M.D.   On: 01/12/2021 03:37   US RENAL  Result Date: 01/12/2021 CLINICAL DATA:  Initial evaluation for acute kidney injury. EXAM: RENAL / URINARY TRACT ULTRASOUND COMPLETE COMPARISON:  Ultrasound from 12/30/2020. FINDINGS: Right Kidney: Renal measurements: 9.2 x 4.6 x 4.4 cm = volume: 97.9 mL. Increased echogenicity within the renal parenchyma. No nephrolithiasis or hydronephrosis. 6.4 x 5.0 x 5.1 cm simple cyst present at the lower pole. An adjacent smaller simple cyst measures 2.7 x 2.6 x 2.1 cm. No solid renal mass. Left Kidney: Renal measurements: 9.8 x 4.8 x 5.7 cm = volume: 139.2 mL. Increased echogenicity within the renal parenchyma. No nephrolithiasis or hydronephrosis. 3.4 x 2.8 x 3.0 cm complex cyst with internal septations present at the upper pole. No internal vascularity or solid component. This is stable from prior. Bladder: Appears normal for degree of bladder distention. Other: None. IMPRESSION: 1. No hydronephrosis. 2. Increased echogenicity within the renal parenchyma, compatible with medical renal disease. 3. Bilateral renal cysts as above, including a 3.4 cm complex septated cyst at the upper pole. These are felt to be benign, and are similar to previous. Electronically Signed   By: Rise Mu M.D.   On: 01/12/2021 04:36   DG Chest Port 1 View  Result Date: 01/11/2021 CLINICAL DATA:  Shortness of breath. EXAM: PORTABLE CHEST 1 VIEW COMPARISON:  Chest x-ray 12/28/2018. FINDINGS: Cardiomediastinal silhouette is stable. There are patchy multifocal airspace opacities bilaterally as well as diffuse interstitial prominence. Airspace opacities are new in the right  mid and lower lung. There is a small left pleural effusion. There is no pneumothorax. No acute fractures are seen. IMPRESSION: 1. Chronic interstitial lung disease. New patchy airspace opacities in the right upper and lower lung worrisome for superimposed infection. 2. Small left pleural effusion. Electronically Signed   By: Darliss Cheney M.D.   On: 01/11/2021 23:35     Assessment and Plan:   #SOB/NSTEMI: Mrs Rada presented with dyspnea per report while doing minimal activity. She had  TWI anteriorly more progressive , inferior changes  are old. - hs troponin 466 --> 474 --> 210 --> 163. Last cath showed mild LAD and D1 dx. It's possible this has progressed. Echo showed mildly reduced EF no significant WMA. Mildly worsened form 9/1. Will plan to discuss if LHC reasonable for medical management for ACS.  TIMI 4 - NPO at MN - will follow up EKG in the AM and discuss if LHC is reasonable vs. Medical management with 48 hours of heparin. Likely medical management with ILD/age an no active chest pain with downtrending troponins - heparin gtt (ordered) - ASA 325x1, asa 81 mg tomorrow - statin - on BB  Acute on chronic respiratory failure - ILD flare vs CAP - per primary  Chronic diastolic heart failure -volume status challanging, no significant JVD or edema. 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     For questions or updates, please contact [facility] Please consult [email or web address] for contact info under   Maisie Fus, MD

## 2021-01-13 NOTE — Progress Notes (Signed)
Appears to have new onset s-CHF  Plan  - cards consult appreciated    SIGNATURE    Dr. Kalman Shan, M.D., F.C.C.P,  Pulmonary and Critical Care Medicine Staff Physician, [facility] Director - Interstitial Lung Disease  Program  Pulmonary Fibrosis [facility] [place], [place], [address]  NPI Number:  NPI #[id number]  Pager: [phone], If no answer  -> Check AMION or Try [phone] Telephone (clinical office): [phone] Telephone (research): [phone]  5:26 PM 01/13/2021

## 2021-01-13 NOTE — Progress Notes (Signed)
  Echocardiogram 2D Echocardiogram has been performed.  Janalyn Harder 01/13/2021, 12:22 PM

## 2021-01-13 NOTE — Progress Notes (Signed)
ANTICOAGULATION CONSULT NOTE - Consult  Pharmacy Consult for IV heparin Indication: chest pain/ACS  Allergies  Allergen Reactions   Morphine And Related Nausea And Vomiting    Patient Measurements: Height: 5\' 7"  (170.2 cm) Weight: 70.1 kg (154 lb 8.7 oz) IBW/kg (Calculated) : 61.6 Heparin Dosing Weight: 70.7 kg   Vital Signs: Temp: 98.3 F (36.8 C) (10/06 1618) Temp Source: Oral (10/06 1618) BP: 121/39 (10/06 1600) Pulse Rate: 88 (10/06 1600)  Labs: Recent Labs    01/12/21 0000 01/12/21 0205 01/12/21 0433 01/12/21 0946 01/12/21 1748 01/13/21 0247 01/13/21 0553  HGB 9.9*  --  9.8*  --   --  8.6*  --   HCT 32.0*  --  31.7*  --   --  27.4*  --   PLT 293  --  295  --   --  279  --   APTT  --  28  --   --   --   --   --   LABPROT  --  16.5*  --   --   --   --   --   INR  --  1.3*  --   --   --   --   --   HEPARINUNFRC  --   --   --  0.26*  --   --   --   CREATININE 1.76*  --  1.74*  --   --  1.93*  --   TROPONINIHS 466* 474*  --   --  210*  --  163*    Estimated Creatinine Clearance: 22.2 mL/min (A) (by C-G formula based on SCr of 1.93 mg/dL (H)).   Medical History: Past Medical History:  Diagnosis Date   Anemia 10/28/2010   unspecified   Anxiety    Aortic valve sclerosis    Arthritis    Asthma    Chronic diastolic CHF (congestive heart failure) (HCC)    Echo 12/2020 = nl EF, Grade 1 DD, normal RV function, no mention of pulm art pressure though   Chronic kidney disease    Chronic Kidney Disease---Stage III   Coronary artery disease 10/28/2010   DDD (degenerative disc disease), lumbar 2006   Diabetes type 2, controlled (HCC) 10/28/2010   Dyspnea    GERD (gastroesophageal reflux disease)    Gout 10/28/2010   Heart murmur    History of cardiac catheterization 2006   LAD 50%, D1 50% rest normal.    History of cardiovascular stress test 09/23/2009    lexiscan Normal Cardiolite test, EF 61%. LV size and function - normal   History of echocardiogram  05/27/2008   EF 55%; Mild aortic insufficiency with minimal aortic sclerosis. L vent normal.    Hyperlipidemia    Hyperlipidemia LDL goal <100 10/28/2010   managed by Dr. 10/30/2010   Hypertension 10/28/2010   IPF (idiopathic pulmonary fibrosis) (HCC)    Progressive, with UIP   Mild vitamin D deficiency 02/22/2011   Obesity (BMI 30-39.9) 10/28/2010   unspecified   Osteoarthritis 10/28/2010   bilateral knees, left hip   Personal history of colonic polyps 10/28/2010   Shortness of breath    Syncope 2013   Vitamin B 12 deficiency 10/28/2010    Medications:  Scheduled:   allopurinol  300 mg Oral Daily   [START ON 01/14/2021] aspirin EC  81 mg Oral Daily   aspirin  325 mg Oral Once   Chlorhexidine Gluconate Cloth  6 each Topical Daily   cholecalciferol  1,000 Units  Oral Daily   docusate sodium  100 mg Oral BID   fluticasone furoate-vilanterol  1 puff Inhalation Daily   furosemide  20 mg Intravenous Once   gabapentin  300 mg Oral 3 times per day   insulin aspart  3-9 Units Subcutaneous Q4H   loratadine  10 mg Oral Daily   magnesium oxide  400 mg Oral Daily   metoprolol succinate  25 mg Oral Daily   multivitamin with minerals  1 tablet Oral Daily   omega-3 acid ethyl esters  1 g Oral Daily   pantoprazole  40 mg Oral Daily   polyethylene glycol  17 g Oral Daily   [START ON 01/14/2021] predniSONE  40 mg Oral Q breakfast   rosuvastatin  40 mg Oral Daily   umeclidinium bromide  1 puff Inhalation Daily    Assessment: Pharmacy is consulted to dose heparin drip in 80 yo female diagnosed with ACS. Pt not on anticoagulation PTA per med rec. Pt previously on heparin 5000 units SQ q8h prophylactic dosing. LD of SQ heparin 10/6  at 1435   Today, 01/13/21  Hgb 8.6 low but stable Plt 279  Scr 1.93 mg/dl, CrCl 22 ml/min      Goal of Therapy:  Heparin level 0.3-0.7 units/ml Monitor platelets by anticoagulation protocol: Yes   Plan:  Heparin 2500 unit bolus, followed by heparin 850 units   Obtain HL 8 hours after start of infusion  Daily CBC while on heparin  Monitor for signs and symptoms of bleeding  Adalberto Cole, PharmD, BCPS 01/13/2021 6:04 PM

## 2021-01-13 NOTE — Telephone Encounter (Signed)
Patient is still admitted. Her appt info will appear on the discharge summary. She has been scheduled for 02/04/21 at 230pm with [facility] in [place].

## 2021-01-14 DIAGNOSIS — N1832 Chronic kidney disease, stage 3b: Secondary | ICD-10-CM

## 2021-01-14 DIAGNOSIS — E119 Type 2 diabetes mellitus without complications: Secondary | ICD-10-CM

## 2021-01-14 LAB — CBC
HCT: 26.1 % — ABNORMAL LOW (ref 36.0–46.0)
Hemoglobin: 8 g/dL — ABNORMAL LOW (ref 12.0–15.0)
MCH: 28.5 pg (ref 26.0–34.0)
MCHC: 30.7 g/dL (ref 30.0–36.0)
MCV: 92.9 fL (ref 80.0–100.0)
Platelets: 299 10*3/uL (ref 150–400)
RBC: 2.81 MIL/uL — ABNORMAL LOW (ref 3.87–5.11)
RDW: 18.3 % — ABNORMAL HIGH (ref 11.5–15.5)
WBC: 10 10*3/uL (ref 4.0–10.5)
nRBC: 0 % (ref 0.0–0.2)

## 2021-01-14 LAB — CULTURE, RESPIRATORY W GRAM STAIN: Culture: NORMAL

## 2021-01-14 LAB — GLUCOSE, CAPILLARY
Glucose-Capillary: 102 mg/dL — ABNORMAL HIGH (ref 70–99)
Glucose-Capillary: 168 mg/dL — ABNORMAL HIGH (ref 70–99)
Glucose-Capillary: 224 mg/dL — ABNORMAL HIGH (ref 70–99)
Glucose-Capillary: 235 mg/dL — ABNORMAL HIGH (ref 70–99)
Glucose-Capillary: 361 mg/dL — ABNORMAL HIGH (ref 70–99)
Glucose-Capillary: 78 mg/dL (ref 70–99)
Glucose-Capillary: 80 mg/dL (ref 70–99)
Glucose-Capillary: 85 mg/dL (ref 70–99)

## 2021-01-14 LAB — HEPARIN LEVEL (UNFRACTIONATED)
Heparin Unfractionated: 1.09 IU/mL — ABNORMAL HIGH (ref 0.30–0.70)
Heparin Unfractionated: 0.23 IU/mL — ABNORMAL LOW (ref 0.30–0.70)

## 2021-01-14 LAB — PROCALCITONIN: Procalcitonin: 1.43 ng/mL

## 2021-01-14 LAB — COMPREHENSIVE METABOLIC PANEL
ALT: 14 U/L (ref 0–44)
AST: 49 U/L — ABNORMAL HIGH (ref 15–41)
Albumin: 2.2 g/dL — ABNORMAL LOW (ref 3.5–5.0)
Alkaline Phosphatase: 63 U/L (ref 38–126)
Anion gap: 7 (ref 5–15)
BUN: 29 mg/dL — ABNORMAL HIGH (ref 8–23)
CO2: 25 mmol/L (ref 22–32)
Calcium: 9.2 mg/dL (ref 8.9–10.3)
Chloride: 107 mmol/L (ref 98–111)
Creatinine, Ser: 2.14 mg/dL — ABNORMAL HIGH (ref 0.44–1.00)
GFR, Estimated: 23 mL/min — ABNORMAL LOW (ref >60)
Glucose, Bld: 75 mg/dL (ref 70–99)
Potassium: 3.3 mmol/L — ABNORMAL LOW (ref 3.5–5.1)
Sodium: 139 mmol/L (ref 135–145)
Total Bilirubin: 0.3 mg/dL (ref 0.3–1.2)
Total Protein: 6.1 g/dL — ABNORMAL LOW (ref 6.5–8.1)

## 2021-01-14 LAB — PHOSPHORUS: Phosphorus: 3.2 mg/dL (ref 2.5–4.6)

## 2021-01-14 MED ORDER — INSULIN ASPART 100 UNIT/ML IJ SOLN
0.0000 [IU] | Freq: Three times a day (TID) | INTRAMUSCULAR | Status: DC
  Administered 2021-01-14: 5 [IU] via SUBCUTANEOUS
  Administered 2021-01-14: 15 [IU] via SUBCUTANEOUS
  Administered 2021-01-15: 5 [IU] via SUBCUTANEOUS
  Administered 2021-01-15: 8 [IU] via SUBCUTANEOUS
  Administered 2021-01-16 – 2021-01-17 (×3): 5 [IU] via SUBCUTANEOUS

## 2021-01-14 MED ORDER — GUAIFENESIN-DM 100-10 MG/5ML PO SYRP
5.0000 mL | ORAL_SOLUTION | ORAL | Status: DC | PRN
  Administered 2021-01-14 – 2021-01-17 (×6): 5 mL via ORAL
  Filled 2021-01-14 (×6): qty 10

## 2021-01-14 MED ORDER — HEPARIN (PORCINE) 25000 UT/250ML-% IV SOLN
750.0000 [IU]/h | INTRAVENOUS | Status: AC
  Administered 2021-01-14: 650 [IU]/h via INTRAVENOUS
  Administered 2021-01-14: 750 [IU]/h via INTRAVENOUS
  Filled 2021-01-14: qty 250

## 2021-01-14 MED ORDER — HEPARIN BOLUS VIA INFUSION
2000.0000 [IU] | Freq: Once | INTRAVENOUS | Status: AC
  Administered 2021-01-14: 2000 [IU] via INTRAVENOUS
  Filled 2021-01-14: qty 2000

## 2021-01-14 MED ORDER — POTASSIUM CHLORIDE CRYS ER 20 MEQ PO TBCR
30.0000 meq | EXTENDED_RELEASE_TABLET | Freq: Once | ORAL | Status: AC
  Administered 2021-01-14: 30 meq via ORAL
  Filled 2021-01-14: qty 1

## 2021-01-14 MED ORDER — INSULIN ASPART 100 UNIT/ML IJ SOLN: 0.0000 [IU] | Freq: Three times a day (TID) | INTRAMUSCULAR | Status: DC

## 2021-01-14 MED ORDER — SODIUM CHLORIDE 0.9 % IV SOLN
INTRAVENOUS | Status: DC | PRN
  Administered 2021-01-14 – 2021-01-16 (×2): 500 mL via INTRAVENOUS

## 2021-01-14 MED ORDER — FUROSEMIDE 10 MG/ML IJ SOLN
40.0000 mg | Freq: Once | INTRAMUSCULAR | Status: AC
  Administered 2021-01-14: 40 mg via INTRAVENOUS
  Filled 2021-01-14: qty 4

## 2021-01-14 NOTE — Evaluation (Signed)
Physical Therapy Evaluation Patient Details Name: Sierra Guzman MRN: [id number] DOB: 1941/01/07 Today's Date: 01/14/2021  History of Present Illness  80 y.o. female admitted 01/12/21 with  Acute on chronic respiratory failure in the setting of chronic hypoxemic respiratory failure in the setting of advanced interstitial lung disease.  Pt recently admitted 8/28-9/02/2021 with acute respiratory failure and 12/27/20-01/03/21 with AKI. PMH includes pulmonary fibrosis on 2 L O2 at rest and 5 L O2 with ambulation, CAD, DM2, gout, HLD, GERD, CKD stage IIIb.  Clinical Impression  Pt admitted with above diagnosis. Min assist for sit to stand and to pivot to bedside commode and then to recliner. SaO2 80% on 6L O2 with activity and with coughing, 90% on 8L O2. Activity tolerance limited by dyspnea and hypoxia.  Pt currently with functional limitations due to the deficits listed below (see PT Problem List). Pt will benefit from skilled PT to increase their independence and safety with mobility to allow discharge to the venue listed below.          Recommendations for follow up therapy are one component of a multi-disciplinary discharge planning process, led by the attending physician.  Recommendations may be updated based on patient status, additional functional criteria and insurance authorization.  Follow Up Recommendations SNF    Equipment Recommendations  None recommended by PT    Recommendations for Other Services       Precautions / Restrictions Precautions Precautions: Fall;Other (comment) Precaution Comments: monitor SpO2/DOE Restrictions Weight Bearing Restrictions: No      Mobility  Bed Mobility Overal bed mobility: Needs Assistance Bed Mobility: Supine to Sit     Supine to sit: Supervision;HOB elevated     General bed mobility comments: for safety    Transfers Overall transfer level: Needs assistance Equipment used: Rolling walker (2 wheeled) Transfers: Sit to/from [employer] Transfers   Stand pivot transfers: Min assist;+2 safety/equipment;From elevated surface       General transfer comment: for power up and pivot over to BSC; SaO2 80% on 6L O2 with activity and while coughing, 90% on 8L O2 with activity  Ambulation/Gait                Stairs            Wheelchair Mobility    Modified Rankin (Stroke Patients Only)       Balance Overall balance assessment: Needs assistance Sitting-balance support: No upper extremity supported Sitting balance-Leahy Scale: Good     Standing balance support: During functional activity;Bilateral upper extremity supported Standing balance-Leahy Scale: Poor Standing balance comment: reliant on external support                             Pertinent Vitals/Pain Pain Assessment: No/denies pain    Home Living Family/patient expects to be discharged to:: Private residence Living Arrangements: Alone Available Help at Discharge: Family;Available PRN/intermittently Type of Home: House Home Access: Ramped entrance     Home Layout: Multi-level Home Equipment: Walker - 4 wheels      Prior Function Level of Independence: Needs assistance   Gait / Transfers Assistance Needed: Pt has been doing minimal walking at SNF prior to this admission. Reports participating in rehab prior to recent admission at [facility]. 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  ADL's / Homemaking Assistance Needed: reports independence in ADLs        Hand Dominance   Dominant  Hand: Right    Extremity/Trunk Assessment   Upper Extremity Assessment Upper Extremity Assessment: Overall WFL for tasks assessed    Lower Extremity Assessment Lower Extremity Assessment: Overall WFL for tasks assessed;Defer to PT evaluation    Cervical / Trunk Assessment Cervical / Trunk Assessment: Kyphotic  Communication   Communication: No difficulties  Cognition  Arousal/Alertness: Awake/alert Behavior During Therapy: WFL for tasks assessed/performed Overall Cognitive Status: Within Functional Limits for tasks assessed                                 General Comments: pt motivated and pleasant, able to follow multistep commands      General Comments      Exercises     Assessment/Plan    PT Assessment Patient needs continued PT services  PT Problem List Decreased activity tolerance;Decreased balance;Decreased mobility;Cardiopulmonary status limiting activity       PT Treatment Interventions DME instruction;Gait training;Functional mobility training;Therapeutic exercise;Therapeutic activities;Balance training;Neuromuscular re-education;Patient/family education    PT Goals (Current goals can be found in the Care Plan section)  Acute Rehab PT Goals Patient Stated Goal: to return to rehab and improve activity tolerance in order to return to independence, likes to teach art classes PT Goal Formulation: With patient Time For Goal Achievement: 01/28/21 Potential to Achieve Goals: Good    Frequency Min 2X/week   Barriers to discharge        Co-evaluation PT/OT/SLP Co-Evaluation/Treatment: Yes Reason for Co-Treatment: Complexity of the patient's impairments (multi-system involvement);To address functional/ADL transfers;For patient/therapist safety PT goals addressed during session: Mobility/safety with mobility;Balance;Proper use of DME         AM-PAC PT "6 Clicks" Mobility  Outcome Measure Help needed turning from your back to your side while in a flat bed without using bedrails?: A Little Help needed moving from lying on your back to sitting on the side of a flat bed without using bedrails?: A Little Help needed moving to and from a bed to a chair (including a wheelchair)?: A Little Help needed standing up from a chair using your arms (e.g., wheelchair or bedside chair)?: A Little Help needed to walk in hospital room?:  A Lot Help needed climbing 3-5 steps with a railing? : Total 6 Click Score: 15    End of Session Equipment Utilized During Treatment: Oxygen Activity Tolerance: Treatment limited secondary to medical complications (Comment) (DOE) Patient left: in chair;with call bell/phone within reach;with chair alarm set;with nursing/sitter in room Nurse Communication: Mobility status PT Visit Diagnosis: Other abnormalities of gait and mobility (R26.89);Muscle weakness (generalized) (M62.81)    Time: 1740-8144 PT Time Calculation (min) (ACUTE ONLY): 28 min   Charges:   PT Evaluation $PT Eval Moderate Complexity: 1 Mod          Tamala Ser PT 01/14/2021  Acute Rehabilitation Services Pager [phone] Office [phone]

## 2021-01-14 NOTE — Progress Notes (Addendum)
PROGRESS NOTE    Sierra Guzman  JJH:[id number] DOB: 1940-08-20 DOA: 01/11/2021 PCP: System, Provider Not In    Brief Narrative:  Sierra Guzman was admitted to the hospital with the working diagnosis of acute on chronic hypoxemic respiratory failure, in the setting of advanced interstitial lung disease.   80 year old female with past medical history for pulmonary fibrosis, chronic hypoxic respiratory failure who was transferred from skilled nursing facility due to worsening dyspnea.  At home using 2 L of oxygen at rest and 6 L on exertion.  She has been recently placed on systemic corticosteroids taper. On her initial physical examination blood pressure 88/54, heart rate 98, temperature 99.4, respiratory rate 35, oxygen saturation 100% on supplemental oxygen.  Her lungs had bilateral rales, no wheezing, positive conversational dyspnea, heart S1-S2, present, rhythmic, soft abdomen, no lower extremity edema.  Venous pH 7.35, sodium 132, potassium 4.2, chloride 104, bicarb 19, glucose 152, BUN 24, creatinine 1.76, high sensitive troponin [id number].  BNP 1000 114. SARS COVID-19 negative.  Her chest radiograph had diffuse bilateral reticulonodular infiltrates.  EKG 106 bpm, left axis deviation, normal intervals, sinus rhythm with PVCs and PACs, no significant ST segment or T wave changes.  Patient was placed on supplemental oxygen per nasal cannula.  High dose of systemic corticosteroids.  Further work-up with CT chest negative for pulm embolism. Patient received diuresis.  Transferred to [facility] 10/7.   Assessment & Plan:   Principal Problem:   Acute respiratory failure with hypoxia (HCC) Active Problems:   Coronary artery disease   Diabetes type 2, controlled (HCC)   Hypertension   Chronic respiratory failure with hypoxia (HCC)   Chronic diastolic CHF (congestive heart failure) (HCC)   AKI (acute kidney injury) (HCC)   IPF (idiopathic pulmonary fibrosis) (HCC)   CKD (chronic kidney  disease) stage 3, GFR 30-59 ml/min (HCC)   Pressure injury of skin  Acute on chronic hypoxemic respiratory failure, in the setting of pulmonary fibrosis. Community acquired pneumonia.  Patient on 4 to 5 L HFNC with oxygen saturation of 92 to 98%. Continue to have dyspnea.  Wbc is 10,8, sputum culture with normal flora. Urine antigen negative for strep pneumo and legionella.  Procalcitonin 1,43.   Plan to continue supplemental 02 per [place] to keep oxygen saturation 88% or greater.  Prednisone 40 mg daily.  Bronchodilator therapy with Breo and albuterol.  On hold anti-fibrosis therapy. Will follow up with pulmonary as outpatient.   2. Acute diastolic heart failure, elevated troponin/NSTEMI Patient had furosemide IV, urine output over las 24 hrs 1,100 ml.  Continue blood pressure monitoring.   Demand ischemia, follow up echocardiogram with EF 45 to 50%, RV moderate reduce systolic function. RSVP 24.2 mmHg. Mild aortic valve stenosis.  Continue with metoprolol 25 mg XL Plan to continue heparin for 48 hrs.   3. AKI on CKD stage 3b. hypokalemia Renal function with serum cr at 2,14 with K at 3,3 and serum bicarbonate at 25.  Add 20 KCl po and follow up renal function in am. Hold on diuresis for now.  Avoid hypotension or nephrotoxic medications.   4. T2DM. Steroids induced hyperglycemia. dyslipidemia Continue insulin sliding scale, will increase intensity. Tapering steroids.  Continue with statin therapy.   5. Chronic anemia. Hgb and hct stable.    6. Stage 2 sacrum pressure ulcer. Continue local skin care    Patient continue to be at high risk for worsening respiratory failure   Status is: Inpatient  Remains inpatient appropriate because:Inpatient level  of care appropriate due to severity of illness  Dispo: The patient is from: Home              Anticipated d/c is to: Home              Patient currently is not medically stable to d/c.   Difficult to place patient No  DVT  prophylaxis: Heparin   Code Status:    full  Family Communication:   I spoke over the phone with the patient's daughter about patient's  condition, plan of care, prognosis and all questions were addressed.      Nutrition Status:           Skin Documentation: Pressure Injury 01/12/21 Sacrum Right Stage 2 -  Partial thickness loss of dermis presenting as a shallow open injury with a red, pink wound bed without slough. (Active)  01/12/21 0900  Location: Sacrum  Location Orientation: Right  Staging: Stage 2 -  Partial thickness loss of dermis presenting as a shallow open injury with a red, pink wound bed without slough.  Wound Description (Comments):   Present on Admission: Yes     Consultants:  Cardiology     Antimicrobials:  Ceftriaxone and azithromycin     Subjective: Patient continue to have dyspnea, worse at rest, no chest pain, no nausea or vomiting, limited mobility due to dyspnea.   Objective: Vitals:   01/14/21 1000 01/14/21 1025 01/14/21 1100 01/14/21 1200  BP: 137/64 (!) 129/59 121/70 115/77  Pulse: 87 77 85 (!) 101  Resp: 15  (!) 28 20  Temp:    98 F (36.7 C)  TempSrc:    Oral  SpO2: 92%  92% 92%  Weight:      Height:        Intake/Output Summary (Last 24 hours) at 01/14/2021 1337 Last data filed at 01/14/2021 1314 Gross per 24 hour  Intake 422.85 ml  Output 1850 ml  Net -1427.15 ml   Filed Weights   01/11/21 2300 01/13/21 0500  Weight: 73.2 kg 70.1 kg    Examination:   General: Not in pain or dyspnea, deconditioned  Neurology: Awake and alert, non focal  E ENT: mild pallor, no icterus, oral mucosa moist Cardiovascular: No JVD. S1-S2 present, rhythmic, no gallops, rubs, or murmurs. No lower extremity edema. Pulmonary: positive breath sounds bilaterally, with no wheezing, scattered rhonchi, bilateral inspiratory rales. Gastrointestinal. Abdomen soft and non tender Skin. No rashes Musculoskeletal: no joint deformities     Data Reviewed:  I have personally reviewed following labs and imaging studies  CBC: Recent Labs  Lab 01/12/21 0000 01/12/21 0433 01/13/21 0247 01/14/21 0249  WBC 11.7* 11.4* 9.1 10.0  HGB 9.9* 9.8* 8.6* 8.0*  HCT 32.0* 31.7* 27.4* 26.1*  MCV 92.8 93.5 93.2 92.9  PLT 293 295 279 299   Basic Metabolic Panel: Recent Labs  Lab 01/12/21 0000 01/12/21 0433 01/12/21 1748 01/13/21 0247 01/13/21 0553 01/14/21 0249  NA 132* 135  --  136  --  139  K 4.2 3.8  --  3.6  --  3.3*  CL 104 105  --  106  --  107  CO2 19* 21*  --  23  --  25  GLUCOSE 152* 110*  --  104*  --  75  BUN 24* 22  --  27*  --  29*  CREATININE 1.76* 1.74*  --  1.93*  --  2.14*  CALCIUM 8.7* 8.3*  --  8.9  --  9.2  MG  --   --  1.9 2.0 2.0  --   PHOS  --   --   --  3.9  --  3.2   GFR: Estimated Creatinine Clearance: 20 mL/min (A) (by C-G formula based on SCr of 2.14 mg/dL (H)). Liver Function Tests: Recent Labs  Lab 01/13/21 0247 01/14/21 0249  AST 28 49*  ALT 9 14  ALKPHOS 69 63  BILITOT 0.5 0.3  PROT 6.2* 6.1*  ALBUMIN 2.3* 2.2*   No results for input(s): LIPASE, AMYLASE in the last 168 hours. No results for input(s): AMMONIA in the last 168 hours. Coagulation Profile: Recent Labs  Lab 01/12/21 0205  INR 1.3*   Cardiac Enzymes: No results for input(s): CKTOTAL, CKMB, CKMBINDEX, TROPONINI in the last 168 hours. BNP (last 3 results) No results for input(s): PROBNP in the last 8760 hours. HbA1C: No results for input(s): HGBA1C in the last 72 hours. CBG: Recent Labs  Lab 01/14/21 0312 01/14/21 0449 01/14/21 0534 01/14/21 0740 01/14/21 1310  GLUCAP 80 78 102* 85 361*   Lipid Profile: No results for input(s): CHOL, HDL, LDLCALC, TRIG, CHOLHDL, LDLDIRECT in the last 72 hours. Thyroid Function Tests: No results for input(s): TSH, T4TOTAL, FREET4, T3FREE, THYROIDAB in the last 72 hours. Anemia Panel: No results for input(s): VITAMINB12, FOLATE, FERRITIN, TIBC, IRON, RETICCTPCT in the last 72  hours.    Radiology Studies: I have reviewed all of the imaging during this hospital visit personally     Scheduled Meds:  allopurinol  300 mg Oral Daily   aspirin EC  81 mg Oral Daily   Chlorhexidine Gluconate Cloth  6 each Topical Daily   cholecalciferol  1,000 Units Oral Daily   docusate sodium  100 mg Oral BID   fluticasone furoate-vilanterol  1 puff Inhalation Daily   gabapentin  300 mg Oral 3 times per day   insulin aspart  0-15 Units Subcutaneous TID WC   loratadine  10 mg Oral Daily   magnesium oxide  400 mg Oral Daily   metoprolol succinate  25 mg Oral Daily   multivitamin with minerals  1 tablet Oral Daily   omega-3 acid ethyl esters  1 g Oral Daily   pantoprazole  40 mg Oral Daily   polyethylene glycol  17 g Oral Daily   predniSONE  40 mg Oral Q breakfast   rosuvastatin  40 mg Oral Daily   umeclidinium bromide  1 puff Inhalation Daily   Continuous Infusions:  sodium chloride 10 mL/hr at 01/14/21 1314   azithromycin Stopped (01/14/21 0024)   cefTRIAXone (ROCEPHIN)  IV Stopped (01/14/21 0106)   heparin 650 Units/hr (01/14/21 1314)     LOS: 2 days        Charolett Yarrow Annett Gula, MD

## 2021-01-14 NOTE — Progress Notes (Signed)
ANTICOAGULATION CONSULT NOTE - follow up  Pharmacy Consult for IV heparin Indication: chest pain/ACS  Allergies  Allergen Reactions   Morphine And Related Nausea And Vomiting    Patient Measurements: Height: 5\' 7"  (170.2 cm) Weight: 70.1 kg (154 lb 8.7 oz) IBW/kg (Calculated) : 61.6 Heparin Dosing Weight: 70.7 kg   Vital Signs: Temp: 96.8 F (36 C) (10/07 0400) Temp Source: Axillary (10/07 0400) BP: 128/55 (10/07 0600) Pulse Rate: 65 (10/07 0600)  Labs: Recent Labs    01/12/21 0205 01/12/21 0433 01/12/21 0946 01/12/21 1748 01/13/21 0247 01/13/21 0553 01/14/21 0249 01/14/21 0550  HGB  --  9.8*  --   --  8.6*  --  8.0*  --   HCT  --  31.7*  --   --  27.4*  --  26.1*  --   PLT  --  295  --   --  279  --  299  --   APTT 28  --   --   --   --   --   --   --   LABPROT 16.5*  --   --   --   --   --   --   --   INR 1.3*  --   --   --   --   --   --   --   HEPARINUNFRC  --   --  0.26*  --   --   --   --  1.09*  CREATININE  --  1.74*  --   --  1.93*  --  2.14*  --   TROPONINIHS 474*  --   --  210*  --  163*  --   --      Estimated Creatinine Clearance: 20 mL/min (A) (by C-G formula based on SCr of 2.14 mg/dL (H)).   Medical History: Past Medical History:  Diagnosis Date   Anemia 10/28/2010   unspecified   Anxiety    Aortic valve sclerosis    Arthritis    Asthma    Chronic diastolic CHF (congestive heart failure) (HCC)    Echo 12/2020 = nl EF, Grade 1 DD, normal RV function, no mention of pulm art pressure though   Chronic kidney disease    Chronic Kidney Disease---Stage III   Coronary artery disease 10/28/2010   DDD (degenerative disc disease), lumbar 2006   Diabetes type 2, controlled (HCC) 10/28/2010   Dyspnea    GERD (gastroesophageal reflux disease)    Gout 10/28/2010   Heart murmur    History of cardiac catheterization 2006   LAD 50%, D1 50% rest normal.    History of cardiovascular stress test 09/23/2009    lexiscan Normal Cardiolite test, EF 61%. LV  size and function - normal   History of echocardiogram 05/27/2008   EF 55%; Mild aortic insufficiency with minimal aortic sclerosis. L vent normal.    Hyperlipidemia    Hyperlipidemia LDL goal <100 10/28/2010   managed by Dr. 10/30/2010   Hypertension 10/28/2010   IPF (idiopathic pulmonary fibrosis) (HCC)    Progressive, with UIP   Mild vitamin D deficiency 02/22/2011   Obesity (BMI 30-39.9) 10/28/2010   unspecified   Osteoarthritis 10/28/2010   bilateral knees, left hip   Personal history of colonic polyps 10/28/2010   Shortness of breath    Syncope 2013   Vitamin B 12 deficiency 10/28/2010    Medications:  Scheduled:   allopurinol  300 mg Oral Daily   aspirin EC  81 mg Oral Daily   Chlorhexidine Gluconate Cloth  6 each Topical Daily   cholecalciferol  1,000 Units Oral Daily   docusate sodium  100 mg Oral BID   fluticasone furoate-vilanterol  1 puff Inhalation Daily   gabapentin  300 mg Oral 3 times per day   insulin aspart  3-9 Units Subcutaneous Q4H   loratadine  10 mg Oral Daily   magnesium oxide  400 mg Oral Daily   metoprolol succinate  25 mg Oral Daily   multivitamin with minerals  1 tablet Oral Daily   omega-3 acid ethyl esters  1 g Oral Daily   pantoprazole  40 mg Oral Daily   polyethylene glycol  17 g Oral Daily   predniSONE  40 mg Oral Q breakfast   rosuvastatin  40 mg Oral Daily   umeclidinium bromide  1 puff Inhalation Daily    Assessment: Pharmacy is consulted to dose heparin drip in 80 yo female diagnosed with ACS. Pt not on anticoagulation PTA per med rec. Pt previously on heparin 5000 units SQ q8h prophylactic dosing. LD of SQ heparin 10/6  at 1435   Today, 01/14/21  HL 1.09 supratherapeutic on 850 units/hr Hgb 8.0 low but stable Plt WNL Scr 2.14 mg/dl, CrCl 20 ml/min  Per RN no bleeding   Goal of Therapy:  Heparin level 0.3-0.7 units/ml Monitor platelets by anticoagulation protocol: Yes   Plan:  Hold heparin x 1 hour then resume at 650  units/hr Heparin level in 8 hours Daily CBC   Arley Phenix RPh 01/14/2021, 6:25 AM

## 2021-01-14 NOTE — Evaluation (Signed)
Occupational Therapy Evaluation Patient Details Name: Sierra Guzman MRN: [id number] DOB: Sep 01, 1940 Today's Date: 01/14/2021   History of Present Illness 80 y.o. female admitted 01/12/21 with  Acute on chronic respiratory failure in the setting of chronic hypoxemic respiratory failure in the setting of advanced interstitial lung disease.  Pt recently admitted 8/28-9/02/2021 with acute respiratory failure and 12/27/20-01/03/21 with AKI. PMH includes pulmonary fibrosis on 2 L O2 at rest and 5 L O2 with ambulation, CAD, DM2, gout, HLD, GERD, CKD stage IIIb.   Clinical Impression   PTA, pt reports she was receiving rehab at [facility] (ALF) and independent in ADLs with use of RW. On evaluation pt presents with decreased activity tolerance, functional strength, balance, and cardiopulmonary status. Pt currently requiring Min A +2 for stand-pivot to [facility]. Pt with SpO2 dropping to 77 on 6L of O2 with activity, increased to 8L and encouraged pt breathing, SpO2 increasing to 95%. Pt returned to 6L after activity, SpO2 maintaining above 92%. Patient will benefit from skilled OT services while in hospital to improve deficits and learn compensatory strategies as needed in order to return PLOF. Due to need for rehab post d/c and motivation, recommending SNF post-d/c.       Recommendations for follow up therapy are one component of a multi-disciplinary discharge planning process, led by the attending physician.  Recommendations may be updated based on patient status, additional functional criteria and insurance authorization.   Follow Up Recommendations  SNF    Equipment Recommendations  Other (comment) (TBD)    Recommendations for Other Services       Precautions / Restrictions Precautions Precautions: Fall;Other (comment) Precaution Comments: monitor SpO2/DOE Restrictions Weight Bearing Restrictions: No      Mobility Bed Mobility Overal bed mobility: Needs Assistance Bed Mobility: Supine to Sit      Supine to sit: Supervision;HOB elevated     General bed mobility comments: for safety    Transfers Overall transfer level: Needs assistance Equipment used: Rolling walker (2 wheeled) Transfers: Sit to/from [employer] Transfers   Stand pivot transfers: Min assist;+2 safety/equipment;From elevated surface       General transfer comment: for power up and pivot over to [facility]    Balance Overall balance assessment: Needs assistance Sitting-balance support: No upper extremity supported Sitting balance-Leahy Scale: Good     Standing balance support: During functional activity Standing balance-Leahy Scale: Poor Standing balance comment: reliant on external support                           ADL either performed or assessed with clinical judgement   ADL Overall ADL's : Needs assistance/impaired Eating/Feeding: Sitting;Modified independent   Grooming: Sitting;Set up   Upper Body Bathing: Sitting;Set up   Lower Body Bathing: Set up;Sitting/lateral leans   Upper Body Dressing : Set up;Sitting   Lower Body Dressing: Sitting/lateral leans;Minimal assistance Lower Body Dressing Details (indicate cue type and reason): donned socks sitting EOB with no LOBs in figure four positioning. Toilet Transfer: Stand-pivot;Minimal assistance;+2 for Engineer, maintenance (IT) Details (indicate cue type and reason): +2 for line management and stabilizing BSC Toileting- Clothing Manipulation and Hygiene: Maximal assistance Toileting - Clothing Manipulation Details (indicate cue type and reason): nurse tech performing perianal care, pt able to stand up with assist during     Functional mobility during ADLs: Minimal assistance;Rolling walker;+2 for physical assistance       Vision Baseline Vision/History: 1 Wears glasses Patient Visual Report: No change from  baseline       Perception     Praxis      Pertinent Vitals/Pain Pain Assessment: No/denies pain      Hand Dominance Right   Extremity/Trunk Assessment Upper Extremity Assessment Upper Extremity Assessment: Overall WFL for tasks assessed   Lower Extremity Assessment Lower Extremity Assessment: Overall WFL for tasks assessed;Defer to PT evaluation   Cervical / Trunk Assessment Cervical / Trunk Assessment: Kyphotic   Communication Communication Communication: No difficulties   Cognition Arousal/Alertness: Awake/alert Behavior During Therapy: WFL for tasks assessed/performed Overall Cognitive Status: Within Functional Limits for tasks assessed                                 General Comments: pt motivated and pleasant, able to follow multistep commands   General Comments       Exercises     Shoulder Instructions      Home Living Family/patient expects to be discharged to:: Private residence Living Arrangements: Alone Available Help at Discharge: Family;Available PRN/intermittently Type of Home: House Home Access: Ramped entrance     Home Layout: Multi-level Alternate Level Stairs-Number of Steps: 3 steps with B rails to access den Alternate Level Stairs-Rails: Right;Left;Can reach both Bathroom Shower/Tub: Producer, television/film/video: Handicapped height Bathroom Accessibility: Yes   Home Equipment: Environmental consultant - 4 wheels          Prior Functioning/Environment Level of Independence: Needs assistance  Gait / Transfers Assistance Needed: Pt has been doing minimal walking at SNF prior to this admission. Reports participating in rehab prior to recent admission at [facility] 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 ADL's / Homemaking Assistance Needed: reports independence in ADLs            OT Problem List: Decreased strength;Decreased activity tolerance;Impaired balance (sitting and/or standing);Cardiopulmonary status limiting activity      OT Treatment/Interventions: Self-care/ADL  training;Therapeutic exercise;Energy conservation;DME and/or AE instruction;Therapeutic activities;Patient/family education;Balance training    OT Goals(Current goals can be found in the care plan section) Acute Rehab OT Goals Patient Stated Goal: to return to rehab and improve activity tolerance in order to return to independence OT Goal Formulation: With patient Time For Goal Achievement: 01/28/21 Potential to Achieve Goals: Good  OT Frequency: Min 2X/week   Barriers to D/C:            Co-evaluation   Reason for Co-Treatment: Complexity of the patient's impairments (multi-system involvement);To address functional/ADL transfers;For patient/therapist safety PT goals addressed during session: Mobility/safety with mobility;Balance;Proper use of DME        AM-PAC OT "6 Clicks" Daily Activity     Outcome Measure Help from another person eating meals?: None Help from another person taking care of personal grooming?: A Little Help from another person toileting, which includes using toliet, bedpan, or urinal?: A Lot Help from another person bathing (including washing, rinsing, drying)?: A Little Help from another person to put on and taking off regular upper body clothing?: A Little Help from another person to put on and taking off regular lower body clothing?: A Little 6 Click Score: 18   End of Session Equipment Utilized During Treatment: Rolling walker;Oxygen Nurse Communication: Mobility status;Other (comment)  Activity Tolerance: Patient tolerated treatment well Patient left: in chair;with call bell/phone within reach;with nursing/sitter in room  OT Visit Diagnosis: Unsteadiness on feet (R26.81);Muscle weakness (generalized) (M62.81)  Time: 1142-1209 OT Time Calculation (min): 23 min Charges:  OT General Charges $OT Visit: 1 Visit OT Evaluation $OT Eval Low Complexity: 1 Low  Prudence Davidson, OTS Acute Rehab Office: [phone]   Corydon Schweiss 01/14/2021,  12:53 PM

## 2021-01-14 NOTE — Progress Notes (Signed)
eLink Physician-Brief Progress Note Patient Name: Sierra Guzman DOB: 1940/07/18 MRN: [id number]   Date of Service  01/14/2021  HPI/Events of Note  Hypokalemia - K+  = 3.3 and Creatinine = 2.14.  eICU Interventions  Will cautiously replace K+.     Intervention Category Major Interventions: Electrolyte abnormality - evaluation and management  Quinn Quam Eugene 01/14/2021, 4:36 AM

## 2021-01-14 NOTE — Progress Notes (Signed)
ANTICOAGULATION CONSULT NOTE  Pharmacy Consult for IV heparin Indication: chest pain/ACS  Allergies  Allergen Reactions   Morphine And Related Nausea And Vomiting    Patient Measurements: Height: 5\' 7"  (170.2 cm) Weight: 70.1 kg (154 lb 8.7 oz) IBW/kg (Calculated) : 61.6 Heparin Dosing Weight: 70.7 kg   Vital Signs: Temp: 97.9 F (36.6 C) (10/07 1504) Temp Source: Axillary (10/07 1504) BP: 122/49 (10/07 1420) Pulse Rate: 95 (10/07 1420)  Labs: Recent Labs    01/12/21 0205 01/12/21 0433 01/12/21 0946 01/12/21 1748 01/13/21 0247 01/13/21 0553 01/14/21 0249 01/14/21 0550 01/14/21 1538  HGB  --  9.8*  --   --  8.6*  --  8.0*  --   --   HCT  --  31.7*  --   --  27.4*  --  26.1*  --   --   PLT  --  295  --   --  279  --  299  --   --   APTT 28  --   --   --   --   --   --   --   --   LABPROT 16.5*  --   --   --   --   --   --   --   --   INR 1.3*  --   --   --   --   --   --   --   --   HEPARINUNFRC  --   --  0.26*  --   --   --   --  1.09* 0.23*  CREATININE  --  1.74*  --   --  1.93*  --  2.14*  --   --   TROPONINIHS 474*  --   --  210*  --  163*  --   --   --      Estimated Creatinine Clearance: 20 mL/min (A) (by C-G formula based on SCr of 2.14 mg/dL (H)).  Medications:  Scheduled:   allopurinol  300 mg Oral Daily   aspirin EC  81 mg Oral Daily   Chlorhexidine Gluconate Cloth  6 each Topical Daily   cholecalciferol  1,000 Units Oral Daily   docusate sodium  100 mg Oral BID   fluticasone furoate-vilanterol  1 puff Inhalation Daily   gabapentin  300 mg Oral 3 times per day   insulin aspart  0-15 Units Subcutaneous TID WC   loratadine  10 mg Oral Daily   magnesium oxide  400 mg Oral Daily   metoprolol succinate  25 mg Oral Daily   multivitamin with minerals  1 tablet Oral Daily   omega-3 acid ethyl esters  1 g Oral Daily   pantoprazole  40 mg Oral Daily   polyethylene glycol  17 g Oral Daily   predniSONE  40 mg Oral Q breakfast   rosuvastatin  40 mg Oral  Daily   umeclidinium bromide  1 puff Inhalation Daily    Assessment: Pharmacy is consulted to dose heparin drip in 80 yo female diagnosed with ACS. Pt not on anticoagulation PTA per med rec. Pt previously on heparin 5000 units SQ q8h prophylactic dosing. LD of SQ heparin 10/6  at 1435   Today, 01/14/21  HL now SUBtherapeutic on 650 units/hr; previously supratherapeutic on 850 units/hr Hgb 8.0 low but stable Plt WNL Scr 2.14 mg/dl, CrCl 20 ml/min  Per RN no bleeding; IV site found to be leaky around noon, but minimal leakage noted and  rectified quickly   Goal of Therapy:  Heparin level 0.3-0.7 units/ml Monitor platelets by anticoagulation protocol: Yes   Plan:  Heparin 2000 unit bolus IV x 1 Increase heparin to 750 units/hr Heparin level in 8 hours Daily CBC and heparin level   Bernadene Person, PharmD, BCPS [phone] 01/14/2021, 4:22 PM

## 2021-01-14 NOTE — Progress Notes (Addendum)
Progress Note  Patient Name: Sierra Guzman Date of Encounter: 01/14/2021  [facility] Cardiologist: Maisie Fus, MD   Subjective   Continues to deny chest pain and shortness of breath. She does have productive cough of thick yellow sputum. O2 requirement improved but still above her baseline at 4L HFNC.   Inpatient Medications    Scheduled Meds:  allopurinol  300 mg Oral Daily   aspirin EC  81 mg Oral Daily   Chlorhexidine Gluconate Cloth  6 each Topical Daily   cholecalciferol  1,000 Units Oral Daily   docusate sodium  100 mg Oral BID   fluticasone furoate-vilanterol  1 puff Inhalation Daily   gabapentin  300 mg Oral 3 times per day   insulin aspart  3-9 Units Subcutaneous Q4H   loratadine  10 mg Oral Daily   magnesium oxide  400 mg Oral Daily   metoprolol succinate  25 mg Oral Daily   multivitamin with minerals  1 tablet Oral Daily   omega-3 acid ethyl esters  1 g Oral Daily   pantoprazole  40 mg Oral Daily   polyethylene glycol  17 g Oral Daily   predniSONE  40 mg Oral Q breakfast   rosuvastatin  40 mg Oral Daily   umeclidinium bromide  1 puff Inhalation Daily   Continuous Infusions:  sodium chloride 500 mL (01/14/21 0759)   azithromycin Stopped (01/14/21 0024)   cefTRIAXone (ROCEPHIN)  IV Stopped (01/14/21 0106)   heparin 650 Units/hr (01/14/21 0731)   PRN Meds: sodium chloride, acetaminophen, albuterol, benzonatate, docusate sodium, polyethylene glycol   Vital Signs    Vitals:   01/14/21 0400 01/14/21 0500 01/14/21 0600 01/14/21 0741  BP: 117/61 (!) 135/54 (!) 128/55   Pulse: 71 62 65   Resp: (!) 30 (!) 24 (!) 22   Temp: (!) 96.8 F (36 C)     TempSrc: Axillary     SpO2: 99% 100% 100% 100%  Weight:      Height:        Intake/Output Summary (Last 24 hours) at 01/14/2021 0823 Last data filed at 01/14/2021 0503 Gross per 24 hour  Intake 337.5 ml  Output 1100 ml  Net -762.5 ml   Last 3 Weights 01/13/2021 01/11/2021 12/28/2020  Weight (lbs) 154 lb  8.7 oz 161 lb 6 oz 161 lb 6 oz  Weight (kg) 70.1 kg 73.2 kg 73.2 kg      Telemetry    Sinus rhythm with HR 70-90s, PACs, PVCs - Personally Reviewed  ECG    No new tracings - Personally Reviewed  Physical Exam   GEN: No acute distress.   Neck: No JVD Cardiac: RRR, no murmurs, rubs, or gallops.  Respiratory: coarse sounds throughout, worse in bases GI: Soft, nontender, non-distended  MS: minimal B LE edema; No deformity. Neuro:  Nonfocal  Psych: Normal affect   Labs    High Sensitivity Troponin:   Recent Labs  Lab 12/21/20 2215 01/12/21 0000 01/12/21 0205 01/12/21 1748 01/13/21 0553  TROPONINIHS 9 466* 474* 210* 163*     Chemistry Recent Labs  Lab 01/12/21 0433 01/12/21 1748 01/13/21 0247 01/13/21 0553 01/14/21 0249  NA 135  --  136  --  139  K 3.8  --  3.6  --  3.3*  CL 105  --  106  --  107  CO2 21*  --  23  --  25  GLUCOSE 110*  --  104*  --  75  BUN 22  --  27*  --  29*  CREATININE 1.74*  --  1.93*  --  2.14*  CALCIUM 8.3*  --  8.9  --  9.2  MG  --  1.9 2.0 2.0  --   PROT  --   --  6.2*  --  6.1*  ALBUMIN  --   --  2.3*  --  2.2*  AST  --   --  28  --  49*  ALT  --   --  9  --  14  ALKPHOS  --   --  69  --  63  BILITOT  --   --  0.5  --  0.3  GFRNONAA 29*  --  26*  --  23*  ANIONGAP 9  --  7  --  7    Lipids No results for input(s): CHOL, TRIG, HDL, LABVLDL, LDLCALC, CHOLHDL in the last 168 hours.  Hematology Recent Labs  Lab 01/12/21 0433 01/13/21 0247 01/14/21 0249  WBC 11.4* 9.1 10.0  RBC 3.39* 2.94* 2.81*  HGB 9.8* 8.6* 8.0*  HCT 31.7* 27.4* 26.1*  MCV 93.5 93.2 92.9  MCH 28.9 29.3 28.5  MCHC 30.9 31.4 30.7  RDW 18.7* 18.7* 18.3*  PLT 295 279 299   Thyroid No results for input(s): TSH, FREET4 in the last 168 hours.  BNP Recent Labs  Lab 01/12/21 0000  BNP 1,114.8*    DDimer  Recent Labs  Lab 01/12/21 0000  DDIMER 4.44*     Radiology    ECHOCARDIOGRAM COMPLETE  Result Date: 01/13/2021    ECHOCARDIOGRAM REPORT    Patient Name:   REGIS WILAND Date of Exam: 01/13/2021 Medical Rec #:  [id number]    Height:       67.0 in Accession #:    [id number]   Weight:       154.5 lb Date of Birth:  Sep 06, 1940    BSA:          1.812 m Patient Age:    80 years     BP:           94/59 mmHg Patient Gender: F            HR:           72 bpm. Exam Location:  Inpatient Procedure: 2D Echo, 3D Echo, Cardiac Doppler and Color Doppler Indications:    Acute respiratory distress. ; R06.02 SOB  History:        Patient has prior history of Echocardiogram examinations, most                 recent 12/09/2020. CHF, CAD, Abnormal ECG,                 Signs/Symptoms:Shortness of Breath and Dyspnea; Risk                 Factors:Diabetes. Hypoxia.  Sonographer:    Sheralyn Boatman RDCS Referring Phys: [id number] [facility]  Sonographer Comments: Technically difficult study due to poor echo windows. IMPRESSIONS  1. Left ventricular ejection fraction, by estimation, is 45 to 50%. Left ventricular ejection fraction by 3D volume is 45 %. The left ventricle has mildly decreased function. The left ventricle has no regional wall motion abnormalities. Left ventricular  diastolic parameters are consistent with Grade I diastolic dysfunction (impaired relaxation).  2. Right ventricular systolic function is moderately reduced. The right ventricular size is mildly enlarged. There is normal pulmonary artery systolic pressure. The estimated right ventricular systolic pressure  is 24.2 mmHg.  3. The mitral valve is grossly normal. Trivial mitral valve regurgitation.  4. The aortic valve is calcified. There is severe calcifcation of the aortic valve. There is moderate thickening of the aortic valve. Aortic valve regurgitation is mild. Mild aortic valve stenosis. FINDINGS  Left Ventricle: Left ventricular ejection fraction, by estimation, is 45 to 50%. Left ventricular ejection fraction by 3D volume is 45 %. The left ventricle has mildly decreased function. The left ventricle has no  regional wall motion abnormalities. The  left ventricular internal cavity size was normal in size. There is no left ventricular hypertrophy. Left ventricular diastolic parameters are consistent with Grade I diastolic dysfunction (impaired relaxation). Right Ventricle: The right ventricular size is mildly enlarged. Right vetricular wall thickness was not well visualized. Right ventricular systolic function is moderately reduced. There is normal pulmonary artery systolic pressure. The tricuspid regurgitant velocity is 2.30 m/s, and with an assumed right atrial pressure of 3 mmHg, the estimated right ventricular systolic pressure is 24.2 mmHg. Left Atrium: Left atrial size was normal in size. Right Atrium: Right atrial size was normal in size. Pericardium: There is no evidence of pericardial effusion. Mitral Valve: The mitral valve is grossly normal. Trivial mitral valve regurgitation. Tricuspid Valve: The tricuspid valve is grossly normal. Tricuspid valve regurgitation is mild. Aortic Valve: The aortic valve is calcified. There is severe calcifcation of the aortic valve. There is moderate thickening of the aortic valve. There is moderate aortic valve annular calcification. Aortic valve regurgitation is mild. Mild aortic stenosis is present. Aortic valve mean gradient measures 10.7 mmHg. Aortic valve peak gradient measures 19.1 mmHg. Aortic valve area, by VTI measures 3.19 cm. Pulmonic Valve: The pulmonic valve was grossly normal. Pulmonic valve regurgitation is mild. Aorta: The aortic root and ascending aorta are structurally normal, with no evidence of dilitation. IAS/Shunts: The atrial septum is grossly normal.  LEFT VENTRICLE PLAX 2D LVIDd:         4.00 cm         Diastology LVIDs:         2.80 cm         LV e' medial:    5.87 cm/s LV PW:         1.00 cm         LV E/e' medial:  10.2 LV IVS:        1.40 cm         LV e' lateral:   7.07 cm/s LVOT diam:     2.20 cm         LV E/e' lateral: 8.5 LV SV:         146 LV  SV Index:   81 LVOT Area:     3.80 cm        3D Volume EF                                LV 3D EF:    Left                                             ventricul LV Volumes (MOD)                            ar LV vol d, MOD  48.3 ml                    ejection A2C:                                        fraction LV vol d, MOD    62.7 ml                    by 3D A4C:                                        volume is LV vol s, MOD    17.1 ml                    45 %. A2C: LV vol s, MOD    31.5 ml A4C:                           3D Volume EF: LV SV MOD A2C:   31.2 ml       3D EF:        45 % LV SV MOD A4C:   62.7 ml       LV EDV:       94 ml LV SV MOD BP:    33.0 ml       LV ESV:       52 ml                                LV SV:        43 ml RIGHT VENTRICLE             IVC RV S prime:     11.52 cm/s  IVC diam: 2.50 cm TAPSE (M-mode): 2.3 cm LEFT ATRIUM           Index        RIGHT ATRIUM           Index LA diam:      3.50 cm 1.93 cm/m   RA Area:     16.90 cm LA Vol (A2C): 16.0 ml 8.83 ml/m   RA Volume:   40.80 ml  22.51 ml/m LA Vol (A4C): 29.4 ml 16.22 ml/m  AORTIC VALVE                     PULMONIC VALVE AV Area (Vmax):    3.25 cm      PR End Diast Vel: 2.60 msec AV Area (Vmean):   3.17 cm AV Area (VTI):     3.19 cm AV Vmax:           218.67 cm/s AV Vmean:          153.667 cm/s AV VTI:            0.459 m AV Peak Grad:      19.1 mmHg AV Mean Grad:      10.7 mmHg LVOT Vmax:         187.00 cm/s LVOT Vmean:        128.000 cm/s LVOT VTI:          0.385 m LVOT/AV VTI ratio: 0.84  AORTA Ao Root diam:  3.60 cm Ao Asc diam:  3.50 cm MITRAL VALVE               TRICUSPID VALVE MV Area (PHT): 3.91 cm    TR Peak grad:   21.2 mmHg MV Decel Time: 194 msec    TR Vmax:        230.00 cm/s MV E velocity: 60.00 cm/s MV A velocity: 98.50 cm/s  SHUNTS MV E/A ratio:  0.61        Systemic VTI:  0.38 m                            Systemic Diam: 2.20 cm Kristeen Miss MD Electronically signed by Kristeen Miss MD Signature Date/Time:  01/13/2021/2:34:56 PM    Final     Cardiac Studies   Echo: 01/13/21: 1. Left ventricular ejection fraction, by estimation, is 45 to 50%. Left  ventricular ejection fraction by 3D volume is 45 %. The left ventricle has  mildly decreased function. The left ventricle has no regional wall motion  abnormalities. Left ventricular   diastolic parameters are consistent with Grade I diastolic dysfunction  (impaired relaxation).   2. Right ventricular systolic function is moderately reduced. The right  ventricular size is mildly enlarged. There is normal pulmonary artery  systolic pressure. The estimated right ventricular systolic pressure is  24.2 mmHg.   3. The mitral valve is grossly normal. Trivial mitral valve  regurgitation.   4. The aortic valve is calcified. There is severe calcifcation of the  aortic valve. There is moderate thickening of the aortic valve. Aortic  valve regurgitation is mild. Mild aortic valve stenosis.   Patient Profile     80 y.o. female with a hx of chronic respiratory failure on home O2 2-6L, chronic diastolic heart failure, CAD, DC2, HTN, PACs, OA, obesity, asthma, and progressive pulmonary fibrosis who is being seen 01/13/2021 for the evaluation of dyspnea.  Assessment & Plan  Problem List: NSTEMI Shortness of breath Mild CAD (LAD), normal nuclear stress in the past Hyperlipidemia with LDL goal < 70  Acute on Chronic diastolic heart failure Reduce LV function- LVEF 45-50%. RV is dilated with moderate reduced fxn (TAPSE/S' overestimates). No sign pulm htn TR velocity 2.3 m/s RVSP 24 mmHg. No RWMA. Mild decline from 12/09/2020 - will give 1x 40 mg IV lasix today; overall appears euvolemic/ with pulmonary fibrosis assessment challanging. No right sided CHF signs. and will not plan to redose with IV.  -Down 5 pounds, on her baseline O2 - BNP 1115, CXR with chronic ILD, question superimposed infection, small left pleural effusion - received 20 mg IV lasix x 2 doses -  1100 urine output yesterday - given underlying pulmonary fibrosis, volume status is difficult  NSTEMI: Had trop 400s->100s, new TWI in the anterior leads with known mild dx in the LAD. Ekg is unchanged. She has no symptoms Treating medically. Discussed with her daughter who is an Charity fundraiser, [name] - continue asa 81 mg daily - continue heparin gtt for total of 48 hours then can stop - continue crestor - continue metoprolol 25 mg daily  HTN: - BP normal - BB per above  Non Cardiac:  Acute on chronic kidney disease stage III - per above - sCr 2.14 (1.93) - baseline felt to be 1.3-1.5 - making urine  Acute on chronic respiratory failure - ILD flare vs CAP - per primary - per patient, she feels her breathing is at baseline -  continue ceftriaxone - productive cough of thick yellow sputum   Discussed with daughter [name] by phone and updated her on the plan.    For questions or updates, please contact [facility] Please consult [email or web address] for contact info under        Signed, Marcelino Duster, PA  01/14/2021, 8:23 AM    Addendum Agree with PA's note. Her volume status is challenging but appears euvolemic to dry, close to her baseline O2 (2-3L at home, 6L with walking was 100% bumped up to 6 for coughing when I saw her). Will not redose lasix. Agree with management for PNA with underlying lung dx. We discussed medical management for her NSTEMI and she and her daiughter agree LHC is too aggressive. Further, EKG stable and troponin downtrended. We can follow peripherally to ensure further improvement off of lasix and with medical management of NSTEMI. Don't hesitate to contact us with any concerns  Physical Exam Neuro: alert and oriented HEENT: Mucous membranes dry CV: r,r,r no murmurs Vasc: 2+ radial pulses Pulm: mild wob, inspiratory crackles throughout Abd: non distended Ext: No LE edema Skin: warm and well perfused Psych: normal mood  Marqui Formby, Alben Spittle, MD

## 2021-01-15 DIAGNOSIS — J9611 Chronic respiratory failure with hypoxia: Secondary | ICD-10-CM

## 2021-01-15 DIAGNOSIS — L89302 Pressure ulcer of unspecified buttock, stage 2: Secondary | ICD-10-CM

## 2021-01-15 LAB — GLUCOSE, CAPILLARY
Glucose-Capillary: 237 mg/dL — ABNORMAL HIGH (ref 70–99)
Glucose-Capillary: 98 mg/dL (ref 70–99)
Glucose-Capillary: 281 mg/dL — ABNORMAL HIGH (ref 70–99)
Glucose-Capillary: 212 mg/dL — ABNORMAL HIGH (ref 70–99)
Glucose-Capillary: 234 mg/dL — ABNORMAL HIGH (ref 70–99)

## 2021-01-15 LAB — CBC
HCT: 28.6 % — ABNORMAL LOW (ref 36.0–46.0)
Hemoglobin: 8.7 g/dL — ABNORMAL LOW (ref 12.0–15.0)
MCH: 28.2 pg (ref 26.0–34.0)
MCHC: 30.4 g/dL (ref 30.0–36.0)
MCV: 92.9 fL (ref 80.0–100.0)
Platelets: 290 10*3/uL (ref 150–400)
RBC: 3.08 MIL/uL — ABNORMAL LOW (ref 3.87–5.11)
RDW: 18 % — ABNORMAL HIGH (ref 11.5–15.5)
WBC: 9.4 10*3/uL (ref 4.0–10.5)
nRBC: 0 % (ref 0.0–0.2)

## 2021-01-15 LAB — BASIC METABOLIC PANEL
Anion gap: 7 (ref 5–15)
BUN: 29 mg/dL — ABNORMAL HIGH (ref 8–23)
CO2: 28 mmol/L (ref 22–32)
Calcium: 9.3 mg/dL (ref 8.9–10.3)
Chloride: 104 mmol/L (ref 98–111)
Creatinine, Ser: 1.69 mg/dL — ABNORMAL HIGH (ref 0.44–1.00)
GFR, Estimated: 30 mL/min — ABNORMAL LOW (ref >60)
Glucose, Bld: 209 mg/dL — ABNORMAL HIGH (ref 70–99)
Potassium: 4 mmol/L (ref 3.5–5.1)
Sodium: 139 mmol/L (ref 135–145)

## 2021-01-15 LAB — HEPARIN LEVEL (UNFRACTIONATED)
Heparin Unfractionated: 0.41 IU/mL (ref 0.30–0.70)
Heparin Unfractionated: 0.43 IU/mL (ref 0.30–0.70)

## 2021-01-15 MED ORDER — ORAL CARE MOUTH RINSE
15.0000 mL | Freq: Two times a day (BID) | OROMUCOSAL | Status: DC
  Administered 2021-01-15 – 2021-01-17 (×3): 15 mL via OROMUCOSAL

## 2021-01-15 MED ORDER — PREDNISONE 20 MG PO TABS
20.0000 mg | ORAL_TABLET | Freq: Every day | ORAL | Status: DC
  Administered 2021-01-16 – 2021-01-17 (×2): 20 mg via ORAL
  Filled 2021-01-15 (×2): qty 1

## 2021-01-15 MED ORDER — ORAL CARE MOUTH RINSE
15.0000 mL | Freq: Two times a day (BID) | OROMUCOSAL | Status: DC
  Administered 2021-01-15: 15 mL via OROMUCOSAL

## 2021-01-15 MED ORDER — HYDROCOD POLST-CPM POLST ER 10-8 MG/5ML PO SUER
5.0000 mL | Freq: Two times a day (BID) | ORAL | Status: DC
Start: 2021-01-15 — End: 2021-01-17
  Administered 2021-01-15 – 2021-01-17 (×5): 5 mL via ORAL
  Filled 2021-01-15 (×5): qty 5

## 2021-01-15 NOTE — Progress Notes (Addendum)
PROGRESS NOTE    Sierra Guzman  PIR:[id number] DOB: Aug 21, 1940 DOA: 01/11/2021 PCP: System, Provider Not In    Brief Narrative:  [name] was admitted to the hospital with the working diagnosis of acute on chronic hypoxemic respiratory failure, in the setting of advanced interstitial lung disease.    80 year old female with past medical history for pulmonary fibrosis, chronic hypoxic respiratory failure who was transferred from skilled nursing facility due to worsening dyspnea.  At home using 2 L of oxygen at rest and 6 L on exertion.  She has been recently placed on systemic corticosteroids taper. On her initial physical examination blood pressure 88/54, heart rate 98, temperature 99.4, respiratory rate 35, oxygen saturation 100% on supplemental oxygen.  Her lungs had bilateral rales, no wheezing, positive conversational dyspnea, heart S1-S2, present, rhythmic, soft abdomen, no lower extremity edema.   Venous pH 7.35, sodium 132, potassium 4.2, chloride 104, bicarb 19, glucose 152, BUN 24, creatinine 1.76, high sensitive troponin [phone].  BNP 1000 114. SARS COVID-19 negative.   Her chest radiograph had diffuse bilateral reticulonodular infiltrates.   EKG 106 bpm, left axis deviation, normal intervals, sinus rhythm with PVCs and PACs, no significant ST segment or T wave changes.   Patient was placed on supplemental oxygen per nasal cannula.  High dose of systemic corticosteroids.   Further work-up with CT chest negative for pulm embolism. Patient received diuresis.   Transferred to [facility] 10/7.   Patient with improvement in oxygenation and renal function. Her family wants patient to be discharged home with home health services.   Assessment & Plan:   Principal Problem:   Acute respiratory failure with hypoxia (HCC) Active Problems:   Coronary artery disease   Diabetes type 2, controlled (HCC)   Hypertension   Chronic respiratory failure with hypoxia (HCC)   Chronic diastolic  CHF (congestive heart failure) (HCC)   AKI (acute kidney injury) (HCC)   IPF (idiopathic pulmonary fibrosis) (HCC)   CKD (chronic kidney disease) stage 3, GFR 30-59 ml/min (HCC)   Pressure injury of skin   Acute on chronic hypoxemic respiratory failure, in the setting of pulmonary fibrosis. Community acquired pneumonia.  Sputum culture with normal flora. Urine antigen negative for strep pneumo and legionella.  Procalcitonin 1,43.    Oxygenation stable using 4L/min per HFNC, with oxygen saturation of 99%. At home on ambulation uses 6 L/min .  Plan to decrease prednisone to 20 mg daily. Continue bronchodilator therapy and antitussive agents. Airway clearing techniques with flutter valve and incentive spirometer.  Plan to complete 5 days of antibiotic therapy.   On hold anti-fibrosis therapy. Will follow up with pulmonary as outpatient.  Continue with inhaled corticosteroids.    2. Acute diastolic heart failure, elevated troponin/NSTEMI Demand ischemia, follow up echocardiogram with EF 45 to 50%, RV moderate reduce systolic function. RSVP 24.2 mmHg. Mild aortic valve stenosis.   Patient with no chest pain.  Tolerating well po metoprolol.  Patient will complete 48 hours of heparin today.     3. AKI on CKD stage 3b. hypokalemia Today serum cr is 1,68 with K at 4,0 and serum bicarbonate at 28. Continue to hold diuresis for now. Follow renal function in am. Avoid hypotension and nephrotoxic medications.    4. T2DM. Steroids induced hyperglycemia. dyslipidemia fasting glucose at 209, capillary 237 and 98.  Plan to continue glucose cover and monitoring with insulin sliding scale. Patient is tolerating po well.  Continue taper prednisone  Continue with rosuvastatin.    5.  Chronic anemia.  Today Hgb is 8,7 and hct is 28,6. No leukocytosis.     6. Stage 2 sacrum pressure ulcer. Local skin care    Continue with high risk for worsening respiratory failure.   Status is:  Inpatient  Remains inpatient appropriate because:Inpatient level of care appropriate due to severity of illness  Dispo: The patient is from: Home              Anticipated d/c is to: Home              Patient currently is not medically stable to d/c.   Difficult to place patient No   DVT prophylaxis: Enoxaparin   Code Status:    full  Family Communication:   I spoke over the phone with the patient's daughter about patient's  condition, plan of care, prognosis and all questions were addressed.      Skin Documentation: Pressure Injury 01/12/21 Sacrum Right Stage 2 -  Partial thickness loss of dermis presenting as a shallow open injury with a red, pink wound bed without slough. (Active)  01/12/21 0900  Location: Sacrum  Location Orientation: Right  Staging: Stage 2 -  Partial thickness loss of dermis presenting as a shallow open injury with a red, pink wound bed without slough.  Wound Description (Comments):   Present on Admission: Yes     Consultants:  Cardiology   Antimicrobials:  Ceftriaxone and azithromycin      Subjective: Patient with improvement in dyspnea but continue to have productive cough, no nausea or vomiting no abdominal pain. She and her family want to be discharge home when medically stable.   Objective: Vitals:   01/15/21 0600 01/15/21 0700 01/15/21 0800 01/15/21 0805  BP: (!) 138/59 (!) 142/57 133/72   Pulse: (!) 58 (!) 58 62   Resp: 17 (!) 24 (!) 24   Temp:    98.1 F (36.7 C)  TempSrc:    Oral  SpO2: 100% 100% 99%   Weight:      Height:        Intake/Output Summary (Last 24 hours) at 01/15/2021 0826 Last data filed at 01/15/2021 0559 Gross per 24 hour  Intake 642.94 ml  Output 1775 ml  Net -1132.06 ml   Filed Weights   01/11/21 2300 01/13/21 0500  Weight: 73.2 kg 70.1 kg    Examination:   General: deconditioned  Neurology: Awake and alert, non focal  E ENT: mild pallor, no icterus, oral mucosa moist Cardiovascular: No JVD. S1-S2  present, rhythmic, no gallops, rubs, or murmurs. No lower extremity edema. Pulmonary: positive breath sounds bilaterally,bilateral diffuse rales. Gastrointestinal. Abdomen soft and non tender Skin. No rashes Musculoskeletal: no joint deformities     Data Reviewed: I have personally reviewed following labs and imaging studies  CBC: Recent Labs  Lab 01/12/21 0000 01/12/21 0433 01/13/21 0247 01/14/21 0249 01/15/21 0038  WBC 11.7* 11.4* 9.1 10.0 9.4  HGB 9.9* 9.8* 8.6* 8.0* 8.7*  HCT 32.0* 31.7* 27.4* 26.1* 28.6*  MCV 92.8 93.5 93.2 92.9 92.9  PLT 293 295 279 299 290   Basic Metabolic Panel: Recent Labs  Lab 01/12/21 0000 01/12/21 0433 01/12/21 1748 01/13/21 0247 01/13/21 0553 01/14/21 0249 01/15/21 0038  NA 132* 135  --  136  --  139 139  K 4.2 3.8  --  3.6  --  3.3* 4.0  CL 104 105  --  106  --  107 104  CO2 19* 21*  --  23  --  25 28  GLUCOSE 152* 110*  --  104*  --  75 209*  BUN 24* 22  --  27*  --  29* 29*  CREATININE 1.76* 1.74*  --  1.93*  --  2.14* 1.69*  CALCIUM 8.7* 8.3*  --  8.9  --  9.2 9.3  MG  --   --  1.9 2.0 2.0  --   --   PHOS  --   --   --  3.9  --  3.2  --    GFR: Estimated Creatinine Clearance: 25.4 mL/min (A) (by C-G formula based on SCr of 1.69 mg/dL (H)). Liver Function Tests: Recent Labs  Lab 01/13/21 0247 01/14/21 0249  AST 28 49*  ALT 9 14  ALKPHOS 69 63  BILITOT 0.5 0.3  PROT 6.2* 6.1*  ALBUMIN 2.3* 2.2*   No results for input(s): LIPASE, AMYLASE in the last 168 hours. No results for input(s): AMMONIA in the last 168 hours. Coagulation Profile: Recent Labs  Lab 01/12/21 0205  INR 1.3*   Cardiac Enzymes: No results for input(s): CKTOTAL, CKMB, CKMBINDEX, TROPONINI in the last 168 hours. BNP (last 3 results) No results for input(s): PROBNP in the last 8760 hours. HbA1C: No results for input(s): HGBA1C in the last 72 hours. CBG: Recent Labs  Lab 01/14/21 1603 01/14/21 2021 01/14/21 2337 01/15/21 0324 01/15/21 0757   GLUCAP 235* 168* 224* 237* 98   Lipid Profile: No results for input(s): CHOL, HDL, LDLCALC, TRIG, CHOLHDL, LDLDIRECT in the last 72 hours. Thyroid Function Tests: No results for input(s): TSH, T4TOTAL, FREET4, T3FREE, THYROIDAB in the last 72 hours. Anemia Panel: No results for input(s): VITAMINB12, FOLATE, FERRITIN, TIBC, IRON, RETICCTPCT in the last 72 hours.    Radiology Studies: I have reviewed all of the imaging during this hospital visit personally     Scheduled Meds:  allopurinol  300 mg Oral Daily   aspirin EC  81 mg Oral Daily   Chlorhexidine Gluconate Cloth  6 each Topical Daily   cholecalciferol  1,000 Units Oral Daily   docusate sodium  100 mg Oral BID   fluticasone furoate-vilanterol  1 puff Inhalation Daily   gabapentin  300 mg Oral 3 times per day   insulin aspart  0-15 Units Subcutaneous TID WC   loratadine  10 mg Oral Daily   magnesium oxide  400 mg Oral Daily   mouth rinse  15 mL Mouth Rinse BID   metoprolol succinate  25 mg Oral Daily   multivitamin with minerals  1 tablet Oral Daily   omega-3 acid ethyl esters  1 g Oral Daily   pantoprazole  40 mg Oral Daily   polyethylene glycol  17 g Oral Daily   predniSONE  40 mg Oral Q breakfast   rosuvastatin  40 mg Oral Daily   umeclidinium bromide  1 puff Inhalation Daily   Continuous Infusions:  sodium chloride 10 mL/hr at 01/14/21 2300   azithromycin Stopped (01/14/21 2208)   cefTRIAXone (ROCEPHIN)  IV Stopped (01/15/21 0116)   heparin 750 Units/hr (01/14/21 2300)     LOS: 3 days        Sierra Brandi Annett Gula, MD

## 2021-01-15 NOTE — Progress Notes (Signed)
Progress Note  Patient Name: Sierra Guzman Date of Encounter: 01/15/2021  [facility] : [facility] Cardiologist: Maisie Fus, MD   Subjective   Feels fine she states. Spoke to daughter [name] on phone   Inpatient Medications    Scheduled Meds:  allopurinol  300 mg Oral Daily   aspirin EC  81 mg Oral Daily   Chlorhexidine Gluconate Cloth  6 each Topical Daily   cholecalciferol  1,000 Units Oral Daily   docusate sodium  100 mg Oral BID   fluticasone furoate-vilanterol  1 puff Inhalation Daily   gabapentin  300 mg Oral 3 times per day   insulin aspart  0-15 Units Subcutaneous TID WC   loratadine  10 mg Oral Daily   magnesium oxide  400 mg Oral Daily   mouth rinse  15 mL Mouth Rinse BID   metoprolol succinate  25 mg Oral Daily   multivitamin with minerals  1 tablet Oral Daily   omega-3 acid ethyl esters  1 g Oral Daily   pantoprazole  40 mg Oral Daily   polyethylene glycol  17 g Oral Daily   predniSONE  40 mg Oral Q breakfast   rosuvastatin  40 mg Oral Daily   umeclidinium bromide  1 puff Inhalation Daily   Continuous Infusions:  sodium chloride 10 mL/hr at 01/14/21 2300   azithromycin Stopped (01/14/21 2208)   cefTRIAXone (ROCEPHIN)  IV Stopped (01/15/21 0116)   heparin 750 Units/hr (01/14/21 2300)   PRN Meds: sodium chloride, acetaminophen, albuterol, benzonatate, docusate sodium, guaiFENesin-dextromethorphan, polyethylene glycol   Vital Signs    Vitals:   01/15/21 0700 01/15/21 0759 01/15/21 0800 01/15/21 0805  BP: (!) 142/57  133/72   Pulse: (!) 58  62   Resp: (!) 24  (!) 24   Temp:    98.1 F (36.7 C)  TempSrc:    Oral  SpO2: 100% 98% 99%   Weight:      Height:        Intake/Output Summary (Last 24 hours) at 01/15/2021 0911 Last data filed at 01/15/2021 0559 Gross per 24 hour  Intake 642.94 ml  Output 1775 ml  Net -1132.06 ml   Last 3 Weights 01/13/2021 01/11/2021 12/28/2020  Weight (lbs) 154 lb 8.7 oz 161 lb 6 oz 161 lb 6 oz  Weight (kg) 70.1 kg 73.2 kg  73.2 kg      Telemetry    SR, PVC's - Personally Reviewed  ECG    No new - Personally Reviewed  Physical Exam   GEN: No acute distress.   Neck: No JVD Cardiac: RRR, no murmurs, rubs, or gallops. O2 noted Respiratory: Mild crackles bilaterally. GI: Soft, nontender, non-distended  MS: No edema; No deformity. Neuro:  Nonfocal  Psych: Normal affect   Labs    High Sensitivity Troponin:   Recent Labs  Lab 12/21/20 2215 01/12/21 0000 01/12/21 0205 01/12/21 1748 01/13/21 0553  TROPONINIHS 9 466* 474* 210* 163*     Chemistry Recent Labs  Lab 01/12/21 1748 01/13/21 0247 01/13/21 0553 01/14/21 0249 01/15/21 0038  NA  --  136  --  139 139  K  --  3.6  --  3.3* 4.0  CL  --  106  --  107 104  CO2  --  23  --  25 28  GLUCOSE  --  104*  --  75 209*  BUN  --  27*  --  29* 29*  CREATININE  --  1.93*  --  2.14*  1.69*  CALCIUM  --  8.9  --  9.2 9.3  MG 1.9 2.0 2.0  --   --   PROT  --  6.2*  --  6.1*  --   ALBUMIN  --  2.3*  --  2.2*  --   AST  --  28  --  49*  --   ALT  --  9  --  14  --   ALKPHOS  --  69  --  63  --   BILITOT  --  0.5  --  0.3  --   GFRNONAA  --  26*  --  23* 30*  ANIONGAP  --  7  --  7 7    Lipids No results for input(s): CHOL, TRIG, HDL, LABVLDL, LDLCALC, CHOLHDL in the last 168 hours.  Hematology Recent Labs  Lab 01/13/21 0247 01/14/21 0249 01/15/21 0038  WBC 9.1 10.0 9.4  RBC 2.94* 2.81* 3.08*  HGB 8.6* 8.0* 8.7*  HCT 27.4* 26.1* 28.6*  MCV 93.2 92.9 92.9  MCH 29.3 28.5 28.2  MCHC 31.4 30.7 30.4  RDW 18.7* 18.3* 18.0*  PLT 279 299 290   Thyroid No results for input(s): TSH, FREET4 in the last 168 hours.  BNP Recent Labs  Lab 01/12/21 0000  BNP 1,114.8*    DDimer  Recent Labs  Lab 01/12/21 0000  DDIMER 4.44*      Cardiac Studies   Echo: 01/13/21: 1. Left ventricular ejection fraction, by estimation, is 45 to 50%. Left  ventricular ejection fraction by 3D volume is 45 %. The left ventricle has  mildly decreased function.  The left ventricle has no regional wall motion  abnormalities. Left ventricular   diastolic parameters are consistent with Grade I diastolic dysfunction  (impaired relaxation).   2. Right ventricular systolic function is moderately reduced. The right  ventricular size is mildly enlarged. There is normal pulmonary artery  systolic pressure. The estimated right ventricular systolic pressure is  24.2 mmHg.   3. The mitral valve is grossly normal. Trivial mitral valve  regurgitation.   4. The aortic valve is calcified. There is severe calcifcation of the  aortic valve. There is moderate thickening of the aortic valve. Aortic  valve regurgitation is mild. Mild aortic valve stenosis.     Patient Profile     80 y.o. female with pulmonary fibrosis on home O2 with chronic diastolic heart failure coronary artery disease being seen for elevated troponin  Assessment & Plan    Demand ischemia, elevated troponin - Troponin as high as 400 decreasing now to 100 range. - T wave inversions noted in the anterior leads with known mild disease in the LAD previously. - No chest pain.  Compatible with demand ischemia in the setting of her underlying medical conditions. - Continuing with aspirin 81 mg - Giving her heparin IV for a total of 48 hours then stopping.  Pharmacy to assist with timing. - Continue with Crestor high intensity dose, 40 mg. - Continuing with metoprolol succinate 25 mg once a day.  Acute on chronic diastolic/systolic heart failure - Pump function is reduced to 45 to 50% with RV dilated reduced function, mild decline from 12/09/2020. - 40 mg of IV Lasix was administered x1 on 01/14/2021 - No signs of right-sided heart failure, no plans to redose IV Lasix As she is down 5 pounds on baseline oxygen - Given the underlying pulmonary fibrosis, assessing volume status and understanding whether or not dyspnea is related to fluid  overload is challenging  Acute on chronic respiratory failure  with pulmonary fibrosis - On antibiotics.  Productive cough thick sputum.  Primary team following.  Acute on chronic kidney disease stage IIIa - Baseline 1.3-1.5 -Today down to 1.69.    For questions or updates, please contact [facility] Please consult [email or web address] for contact info under        Signed, Donato Schultz, MD  01/15/2021, 9:11 AM

## 2021-01-15 NOTE — Progress Notes (Signed)
ANTICOAGULATION CONSULT NOTE  Pharmacy Consult for IV heparin Indication: chest pain/ACS  Allergies  Allergen Reactions   Morphine And Related Nausea And Vomiting    Patient Measurements: Height: 5\' 7"  (170.2 cm) Weight: 70.1 kg (154 lb 8.7 oz) IBW/kg (Calculated) : 61.6 Heparin Dosing Weight: 70.7 kg   Vital Signs: Temp: 98.1 F (36.7 C) (10/08 0805) Temp Source: Oral (10/08 0805) BP: 129/65 (10/08 0921) Pulse Rate: 83 (10/08 0921)  Labs: Recent Labs    01/12/21 1748 01/13/21 0247 01/13/21 0247 01/13/21 0553 01/14/21 0249 01/14/21 0550 01/14/21 1538 01/15/21 0038 01/15/21 0908  HGB  --  8.6*   < >  --  8.0*  --   --  8.7*  --   HCT  --  27.4*  --   --  26.1*  --   --  28.6*  --   PLT  --  279  --   --  299  --   --  290  --   HEPARINUNFRC  --   --   --   --   --    < > 0.23* 0.41 0.43  CREATININE  --  1.93*  --   --  2.14*  --   --  1.69*  --   TROPONINIHS 210*  --   --  163*  --   --   --   --   --    < > = values in this interval not displayed.     Estimated Creatinine Clearance: 25.4 mL/min (A) (by C-G formula based on SCr of 1.69 mg/dL (H)).  Medications:  Scheduled:   allopurinol  300 mg Oral Daily   aspirin EC  81 mg Oral Daily   Chlorhexidine Gluconate Cloth  6 each Topical Daily   chlorpheniramine-HYDROcodone  5 mL Oral Q12H   cholecalciferol  1,000 Units Oral Daily   docusate sodium  100 mg Oral BID   fluticasone furoate-vilanterol  1 puff Inhalation Daily   gabapentin  300 mg Oral 3 times per day   insulin aspart  0-15 Units Subcutaneous TID WC   loratadine  10 mg Oral Daily   magnesium oxide  400 mg Oral Daily   mouth rinse  15 mL Mouth Rinse BID   metoprolol succinate  25 mg Oral Daily   multivitamin with minerals  1 tablet Oral Daily   omega-3 acid ethyl esters  1 g Oral Daily   pantoprazole  40 mg Oral Daily   polyethylene glycol  17 g Oral Daily   [START ON 01/16/2021] predniSONE  20 mg Oral Q breakfast   rosuvastatin  40 mg Oral Daily    umeclidinium bromide  1 puff Inhalation Daily    Assessment: Pharmacy is consulted to dose heparin drip in 80 yo female diagnosed with ACS. Pt not on anticoagulation PTA per med rec. Pt previously on heparin 5000 units SQ q8h prophylactic dosing. LD of SQ heparin 10/6 at 1435. Cardiology following and planning heparin drip for 48 hours (start date 10/6 @ 1845)  Today, 01/15/21  HL therapeutic on 750 units/hr Hgb 8.7 low but stable Plt WNL Scr 1.69 mg/dl, CrCl 25 ml/min   Goal of Therapy:  Heparin level 0.3-0.7 units/ml Monitor platelets by anticoagulation protocol: Yes   Plan:  Continue heparin at 750 units/hr until 10/8 @ 1900 Monitor for s/sx bleeding  12/8, PharmD 01/15/2021 9:51 AM

## 2021-01-15 NOTE — Progress Notes (Signed)
ANTICOAGULATION CONSULT NOTE  Pharmacy Consult for IV heparin Indication: chest pain/ACS  Allergies  Allergen Reactions   Morphine And Related Nausea And Vomiting    Patient Measurements: Height: 5\' 7"  (170.2 cm) Weight: 70.1 kg (154 lb 8.7 oz) IBW/kg (Calculated) : 61.6 Heparin Dosing Weight: 70.7 kg   Vital Signs: Temp: 97.7 F (36.5 C) (10/08 0000) Temp Source: Oral (10/08 0000) BP: 107/55 (10/07 2300) Pulse Rate: 70 (10/07 2300)  Labs: Recent Labs    01/12/21 0205 01/12/21 0433 01/12/21 1748 01/13/21 0247 01/13/21 0553 01/14/21 0249 01/14/21 0550 01/14/21 1538 01/15/21 0038  HGB  --    < >  --  8.6*  --  8.0*  --   --  8.7*  HCT  --    < >  --  27.4*  --  26.1*  --   --  28.6*  PLT  --    < >  --  279  --  299  --   --  290  APTT 28  --   --   --   --   --   --   --   --   LABPROT 16.5*  --   --   --   --   --   --   --   --   INR 1.3*  --   --   --   --   --   --   --   --   HEPARINUNFRC  --    < >  --   --   --   --  1.09* 0.23* 0.41  CREATININE  --    < >  --  1.93*  --  2.14*  --   --  1.69*  TROPONINIHS 474*  --  210*  --  163*  --   --   --   --    < > = values in this interval not displayed.     Estimated Creatinine Clearance: 25.4 mL/min (A) (by C-G formula based on SCr of 1.69 mg/dL (H)).  Medications:  Scheduled:   allopurinol  300 mg Oral Daily   aspirin EC  81 mg Oral Daily   Chlorhexidine Gluconate Cloth  6 each Topical Daily   cholecalciferol  1,000 Units Oral Daily   docusate sodium  100 mg Oral BID   fluticasone furoate-vilanterol  1 puff Inhalation Daily   gabapentin  300 mg Oral 3 times per day   insulin aspart  0-15 Units Subcutaneous TID WC   loratadine  10 mg Oral Daily   magnesium oxide  400 mg Oral Daily   metoprolol succinate  25 mg Oral Daily   multivitamin with minerals  1 tablet Oral Daily   omega-3 acid ethyl esters  1 g Oral Daily   pantoprazole  40 mg Oral Daily   polyethylene glycol  17 g Oral Daily   predniSONE  40  mg Oral Q breakfast   rosuvastatin  40 mg Oral Daily   umeclidinium bromide  1 puff Inhalation Daily    Assessment: Pharmacy is consulted to dose heparin drip in 80 yo female diagnosed with ACS. Pt not on anticoagulation PTA per med rec. Pt previously on heparin 5000 units SQ q8h prophylactic dosing. LD of SQ heparin 10/6  at 1435   Today, 01/15/21  HL therapeutic on 750 units/hr Hgb 8.7 low but stable Plt WNL Scr 1.69 mg/dl, CrCl 25 ml/min  Per RN no bleeding per RN  Goal of Therapy:  Heparin level 0.3-0.7 units/ml Monitor platelets by anticoagulation protocol: Yes   Plan:  Continue heparin at 750 units/hr Confirmatory Heparin level in 8 hours Daily CBC and heparin level   Arley Phenix RPh 01/15/2021, 1:37 AM

## 2021-01-15 NOTE — TOC Initial Note (Signed)
Transition of Care [facility]) - Initial/Assessment Note    Patient Details  Name: Sierra Guzman MRN: [id number] Date of Birth: 12-20-1940  [facility] CM/SW Contact:    Joanne Chars, LCSW Phone Number: 01/15/2021, 2:26 PM  Clinical Narrative:  CSW met with pt regarding DC recommendation for SNF.  Pt is from [facility] but does not want to return, wants to go home with [facility].  Pt got her daughter [name] on the phone who confirms this choice and says there are several daughters available to assist. Choice document given, no agency preference, agreeable to [facility] (in network provider)  Discussed DME--requesting wheelchair, shower chair, bedside commode, suction machine, and hospital bed.  Daughter asks if bed can be delivered on Monday. Home O2 through [facility].  Pt is vaccinated for covid with 2 boosters.  CSW attempted to call [facility] about all DME due to them being the home O2 provider.  Unable to get through on Saturday. Stacie from [facility] accepts [facility] referral.                   Expected Discharge Plan: [place] Barriers to Discharge: Continued Medical Work up   Patient Goals and CMS Choice Patient states their goals for this hospitalization and ongoing recovery are:: get back to close to where I was CMS Medicare.gov Compare Post Acute Care list provided to:: Patient Choice offered to / list presented to : Patient  Expected Discharge Plan and Services Expected Discharge Plan: [place] Choice: [place] arrangements for the past 2 months: Single Family Home Expected Discharge Date:  (unknown)                         HH Arranged: PT, OT, RN [place] Agency: [place] Date [facility] Agency Contacted: 01/15/21 Time HH Agency Contacted: 49 Representative spoke with at [place]: [place] Arrangements/Services Living arrangements for the past 2 months: [place]  with:: Self Patient language and need for interpreter reviewed:: Yes Do you feel safe going back to the place where you live?: Yes      Need for Family Participation in Patient Care: Yes (Comment) Care giver support system in place?: Yes (comment) Current home services: DME ([facility] home O2) Criminal Activity/Legal Involvement Pertinent to Current Situation/Hospitalization: No - Comment as needed  Activities of Daily Living Home Assistive Devices/Equipment: Eyeglasses, Environmental consultant (specify type), Grab bars around toilet, Grab bars in shower, Hand-held shower hose, Hospital bed, Dentures (specify type) (4 wheeled walker, upper/lower dentures) ADL Screening (condition at time of admission) Patient's cognitive ability adequate to safely complete daily activities?: Yes Is the patient deaf or have difficulty hearing?: No Does the patient have difficulty seeing, even when wearing glasses/contacts?: No Does the patient have difficulty concentrating, remembering, or making decisions?: No Patient able to express need for assistance with ADLs?: Yes Does the patient have difficulty dressing or bathing?: Yes Independently performs ADLs?: No (secondary to weakness and shortness of breath) Communication: Independent Dressing (OT): Needs assistance Is this a change from baseline?: Pre-admission baseline Grooming: Needs assistance Is this a change from baseline?: Pre-admission baseline Feeding: Needs assistance Is this a change from baseline?: Pre-admission baseline Bathing: Needs assistance Is this a change from baseline?: Pre-admission baseline Toileting: Needs assistance Is this a change from baseline?: Pre-admission baseline In/Out Bed: Needs assistance Is this a change from baseline?:  Pre-admission baseline Walks in Home: Needs assistance Is this a change from baseline?: Pre-admission baseline Does the patient have difficulty walking or climbing stairs?: Yes (secondary to weakness and shortness of  breath) Weakness of Legs: Both Weakness of Arms/Hands: None  Permission Sought/Granted Permission sought to share information with : Family Supports Permission granted to share information with : Yes, Verbal Permission Granted  Share Information with NAME: daughter [name], other daughters  Permission granted to share info w AGENCY: HH        Emotional Assessment Appearance:: Appears stated age Attitude/Demeanor/Rapport: Engaged Affect (typically observed): Appropriate, Pleasant Orientation: : Oriented to Self, Oriented to Place, Oriented to  Time, Oriented to Situation Alcohol / Substance Use: Not Applicable Psych Involvement: No (comment)  Admission diagnosis:  Acute respiratory failure with hypoxia ([place]) [J96.01] AKI (acute kidney injury) ([place]) [N17.9] Patient Active Problem List   Diagnosis Date Noted   Pressure injury of skin 01/13/2021   CKD (chronic kidney disease) stage 3, GFR 30-59 ml/min ([place]) 01/01/2021   ARF (acute renal failure) ([place]) 12/28/2020   Chronic diastolic CHF (congestive heart failure) ([place]) 12/27/2020   AKI (acute kidney injury) ([place]) 12/27/2020   IPF (idiopathic pulmonary fibrosis) ([place]) 12/27/2020   Sacral decubitus ulcer 12/27/2020   Occult blood positive stool 12/27/2020   DDD (degenerative disc disease), cervical 12/21/2020   Hereditary and idiopathic peripheral neuropathy 12/21/2020   Localized, primary osteoarthritis 12/21/2020   Low back pain 12/21/2020   Pain in limb 12/21/2020   Chronic respiratory failure with hypoxia ([place]) 12/05/2020   Acute respiratory failure with hypoxia ([place]) 12/05/2020   Hip pain 08/06/2020   Spinal stenosis in cervical region 08/06/2020   Spondylolisthesis, congenital 08/06/2020   Status post total hip replacement, 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   Primary localized osteoarthritis of right hip 12/12/2016   B12 deficiency 10/28/2010   Coronary artery disease 10/28/2010   Diabetes type 2, controlled ([place]) 10/28/2010   Gout  10/28/2010   Hyperlipidemia with target LDL less than 100 10/28/2010   Hypertension 10/28/2010   Osteoarthritis of left knee 10/28/2010   Anemia 10/28/2010   History of colonic polyps 10/28/2010   PCP:  System, Provider Not In Pharmacy:   [place], [place] - [address] [place] [address] Phone: [phone] Fax: [phone]     Social Determinants of Health (SDOH) Interventions    Readmission Risk Interventions No flowsheet data found.

## 2021-01-16 LAB — BASIC METABOLIC PANEL
Anion gap: 8 (ref 5–15)
BUN: 24 mg/dL — ABNORMAL HIGH (ref 8–23)
CO2: 28 mmol/L (ref 22–32)
Calcium: 8.9 mg/dL (ref 8.9–10.3)
Chloride: 101 mmol/L (ref 98–111)
Creatinine, Ser: 1.36 mg/dL — ABNORMAL HIGH (ref 0.44–1.00)
GFR, Estimated: 39 mL/min — ABNORMAL LOW (ref >60)
Glucose, Bld: 137 mg/dL — ABNORMAL HIGH (ref 70–99)
Potassium: 4.1 mmol/L (ref 3.5–5.1)
Sodium: 137 mmol/L (ref 135–145)

## 2021-01-16 LAB — CBC
HCT: 28.3 % — ABNORMAL LOW (ref 36.0–46.0)
Hemoglobin: 8.7 g/dL — ABNORMAL LOW (ref 12.0–15.0)
MCH: 28.7 pg (ref 26.0–34.0)
MCHC: 30.7 g/dL (ref 30.0–36.0)
MCV: 93.4 fL (ref 80.0–100.0)
Platelets: 313 10*3/uL (ref 150–400)
RBC: 3.03 MIL/uL — ABNORMAL LOW (ref 3.87–5.11)
RDW: 17.8 % — ABNORMAL HIGH (ref 11.5–15.5)
WBC: 10 10*3/uL (ref 4.0–10.5)
nRBC: 0.2 % (ref 0.0–0.2)

## 2021-01-16 LAB — GLUCOSE, CAPILLARY
Glucose-Capillary: 109 mg/dL — ABNORMAL HIGH (ref 70–99)
Glucose-Capillary: 219 mg/dL — ABNORMAL HIGH (ref 70–99)
Glucose-Capillary: 224 mg/dL — ABNORMAL HIGH (ref 70–99)
Glucose-Capillary: 155 mg/dL — ABNORMAL HIGH (ref 70–99)

## 2021-01-16 MED ORDER — ENOXAPARIN SODIUM 40 MG/0.4ML IJ SOSY
40.0000 mg | PREFILLED_SYRINGE | INTRAMUSCULAR | Status: DC
  Administered 2021-01-16: 40 mg via SUBCUTANEOUS
  Filled 2021-01-16: qty 0.4

## 2021-01-16 MED ORDER — HEPARIN SODIUM (PORCINE) 5000 UNIT/ML IJ SOLN: 5000.0000 [IU] | Freq: Three times a day (TID) | INTRAMUSCULAR | Status: DC

## 2021-01-16 MED ORDER — INSULIN ASPART 100 UNIT/ML IJ SOLN
3.0000 [IU] | Freq: Three times a day (TID) | INTRAMUSCULAR | Status: DC
  Administered 2021-01-16 – 2021-01-17 (×2): 3 [IU] via SUBCUTANEOUS

## 2021-01-16 NOTE — Progress Notes (Signed)
PROGRESS NOTE    Sierra Guzman  FWY:[id number] DOB: 06-22-1940 DOA: 01/11/2021 PCP: System, Provider Not In    Brief Narrative:  Sierra Guzman was admitted to the hospital with the working diagnosis of acute on chronic hypoxemic respiratory failure, in the setting of advanced interstitial lung disease.    80 year old female with past medical history for pulmonary fibrosis, chronic hypoxic respiratory failure who was transferred from skilled nursing facility due to worsening dyspnea.  At home using 2 L of oxygen at rest and 6 L on exertion.  She has been recently placed on systemic corticosteroids taper. On her initial physical examination blood pressure 88/54, heart rate 98, temperature 99.4, respiratory rate 35, oxygen saturation 100% on supplemental oxygen.  Her lungs had bilateral rales, no wheezing, positive conversational dyspnea, heart S1-S2, present, rhythmic, soft abdomen, no lower extremity edema.   Venous pH 7.35, sodium 132, potassium 4.2, chloride 104, bicarb 19, glucose 152, BUN 24, creatinine 1.76, high sensitive troponin [phone].  BNP 1000 114. SARS COVID-19 negative.   Her chest radiograph had diffuse bilateral reticulonodular infiltrates.   EKG 106 bpm, left axis deviation, normal intervals, sinus rhythm with PVCs and PACs, no significant ST segment or T wave changes.   Patient was placed on supplemental oxygen per nasal cannula.  High dose of systemic corticosteroids.   Further work-up with CT chest negative for pulm embolism. Patient received diuresis.   Transferred to [facility] 10/7.   Patient with improvement in oxygenation and renal function. Her family wants patient to be discharged home with home health services.    Assessment & Plan:   Principal Problem:   Acute respiratory failure with hypoxia (HCC) Active Problems:   Coronary artery disease   Diabetes type 2, controlled (HCC)   Hypertension   Chronic respiratory failure with hypoxia (HCC)   Chronic  diastolic CHF (congestive heart failure) (HCC)   AKI (acute kidney injury) (HCC)   IPF (idiopathic pulmonary fibrosis) (HCC)   CKD (chronic kidney disease) stage 3, GFR 30-59 ml/min (HCC)   Pressure injury of skin   Acute on chronic hypoxemic respiratory failure, in the setting of pulmonary fibrosis. Community acquired pneumonia.  Sputum culture with normal flora. Urine antigen negative for strep pneumo and legionella.  Procalcitonin 1,43.    Oxygenation stable using 4L/min tp 5 L/min per HFNC, with oxygen saturation of 99% to 100%.  At home on ambulation uses 6 L/min . Her sputum production has improved today compared to yesterday.    Plan: Continue with prednisone to 20 mg daily for 4 day taper.  On bronchodilator therapy, inhaled corticosteroids, antitussive agents and airway clearing techniques with flutter valve and incentive spirometer.  Tomorrow will complete 5 days of antibiotic therapy with ceftriaxone and azithromycin.   On hold anti-fibrosis therapy. Will follow up with pulmonary as outpatient.     2. Acute diastolic heart failure, elevated troponin/NSTEMI Demand ischemia, follow up echocardiogram with EF 45 to 50%, RV moderate reduce systolic function. RSVP 24.2 mmHg. Mild aortic valve stenosis.    Continue metoprolol succinate 25 mg with good toleration,. Completed 48 hrs of heparin drip. Patient with no chest pain.  Holding on Ace inh or ARB due to risk of worsening renal function.  At home using furosemide as needed, for signs of volume overload.    3. AKI on CKD stage 3b. hypokalemia Patient is tolerating po well.   Renal function today with serum cr 1,36, K is 4,1 and serum bicarbonate at 28.   4.  T2DM. Steroids induced hyperglycemia. dyslipidemia  Steroid dose has been reduced, fasting glucose this am is 137.  Continue insulin sliding scale for glucose cover and monitoring.   On rosuvastatin.    5. Chronic anemia.  Stable Hgb and Hct, follow up cell count  as outpatient.   Wbc is 10 and platelets 313.   6. Stage 2 sacrum pressure ulcer. Local skin care to pressure ulcer. Present on admission     Status is: Inpatient  Remains inpatient appropriate because:Inpatient level of care appropriate due to severity of illness  Dispo: The patient is from: SNF              Anticipated d/c is to: Home plan for dc home tomorrow if patient continue to be medically stable. DME for hospital bed, beside commode, shower chair and wheelchair.               Patient currently is not medically stable to d/c.   Difficult to place patient No  DVT prophylaxis: Heparini sq   Code Status:   full  Family Communication:   No family at the bedside      Nutrition Status:           Skin Documentation: Pressure Injury 01/12/21 Sacrum Right Stage 2 -  Partial thickness loss of dermis presenting as a shallow open injury with a red, pink wound bed without slough. (Active)  01/12/21 0900  Location: Sacrum  Location Orientation: Right  Staging: Stage 2 -  Partial thickness loss of dermis presenting as a shallow open injury with a red, pink wound bed without slough.  Wound Description (Comments):   Present on Admission: Yes     Consultants:  Cardiology    Antimicrobials:  Ceftriaxone     Subjective:  Patient is feeling better, her productive cough has improved but not yet back to baseline, no chest pain and dyspnea is close to baseline.   Objective: Vitals:   01/16/21 0300 01/16/21 0400 01/16/21 0759 01/16/21 0807  BP:  (!) 140/53    Pulse:  (!) 57    Resp:  20    Temp: 98 F (36.7 C) 97.8 F (36.6 C)  97.6 F (36.4 C)  TempSrc: Oral Oral  Oral  SpO2:  100% 99%   Weight:      Height:        Intake/Output Summary (Last 24 hours) at 01/16/2021 0831 Last data filed at 01/15/2021 2300 Gross per 24 hour  Intake 1199.78 ml  Output 250 ml  Net 949.78 ml   Filed Weights   01/11/21 2300 01/13/21 0500  Weight: 73.2 kg 70.1 kg    Examination:    General: deconditioned  Neurology: Awake and alert, non focal  E ENT: mild pallor, no icterus, oral mucosa moist Cardiovascular: No JVD. S1-S2 present, rhythmic, no gallops, rubs, or murmurs. No lower extremity edema. Pulmonary: positive breath sounds bilaterally, with no wheezing, bilateral rhonchi and inspiratory rales. Gastrointestinal. Abdomen soft and non tender Skin. No rashes Musculoskeletal: no joint deformities     Data Reviewed: I have personally reviewed following labs and imaging studies  CBC: Recent Labs  Lab 01/12/21 0433 01/13/21 0247 01/14/21 0249 01/15/21 0038 01/16/21 0253  WBC 11.4* 9.1 10.0 9.4 10.0  HGB 9.8* 8.6* 8.0* 8.7* 8.7*  HCT 31.7* 27.4* 26.1* 28.6* 28.3*  MCV 93.5 93.2 92.9 92.9 93.4  PLT 295 279 299 290 313   Basic Metabolic Panel: Recent Labs  Lab 01/12/21 0433 01/12/21 1748 01/13/21 0247 01/13/21  1601 01/14/21 0249 01/15/21 0038 01/16/21 0253  NA 135  --  136  --  139 139 137  K 3.8  --  3.6  --  3.3* 4.0 4.1  CL 105  --  106  --  107 104 101  CO2 21*  --  23  --  25 28 28   GLUCOSE 110*  --  104*  --  75 209* 137*  BUN 22  --  27*  --  29* 29* 24*  CREATININE 1.74*  --  1.93*  --  2.14* 1.69* 1.36*  CALCIUM 8.3*  --  8.9  --  9.2 9.3 8.9  MG  --  1.9 2.0 2.0  --   --   --   PHOS  --   --  3.9  --  3.2  --   --    GFR: Estimated Creatinine Clearance: 31.5 mL/min (A) (by C-G formula based on SCr of 1.36 mg/dL (H)). Liver Function Tests: Recent Labs  Lab 01/13/21 0247 01/14/21 0249  AST 28 49*  ALT 9 14  ALKPHOS 69 63  BILITOT 0.5 0.3  PROT 6.2* 6.1*  ALBUMIN 2.3* 2.2*   No results for input(s): LIPASE, AMYLASE in the last 168 hours. No results for input(s): AMMONIA in the last 168 hours. Coagulation Profile: Recent Labs  Lab 01/12/21 0205  INR 1.3*   Cardiac Enzymes: No results for input(s): CKTOTAL, CKMB, CKMBINDEX, TROPONINI in the last 168 hours. BNP (last 3 results) No results for input(s): PROBNP in the  last 8760 hours. HbA1C: No results for input(s): HGBA1C in the last 72 hours. CBG: Recent Labs  Lab 01/15/21 0757 01/15/21 1150 01/15/21 1650 01/15/21 2121 01/16/21 0810  GLUCAP 98 281* 212* 234* 109*   Lipid Profile: No results for input(s): CHOL, HDL, LDLCALC, TRIG, CHOLHDL, LDLDIRECT in the last 72 hours. Thyroid Function Tests: No results for input(s): TSH, T4TOTAL, FREET4, T3FREE, THYROIDAB in the last 72 hours. Anemia Panel: No results for input(s): VITAMINB12, FOLATE, FERRITIN, TIBC, IRON, RETICCTPCT in the last 72 hours.    Radiology Studies: I have reviewed all of the imaging during this hospital visit personally     Scheduled Meds:  allopurinol  300 mg Oral Daily   aspirin EC  81 mg Oral Daily   Chlorhexidine Gluconate Cloth  6 each Topical Daily   chlorpheniramine-HYDROcodone  5 mL Oral Q12H   cholecalciferol  1,000 Units Oral Daily   docusate sodium  100 mg Oral BID   fluticasone furoate-vilanterol  1 puff Inhalation Daily   gabapentin  300 mg Oral 3 times per day   insulin aspart  0-15 Units Subcutaneous TID WC   loratadine  10 mg Oral Daily   magnesium oxide  400 mg Oral Daily   mouth rinse  15 mL Mouth Rinse BID   metoprolol succinate  25 mg Oral Daily   multivitamin with minerals  1 tablet Oral Daily   omega-3 acid ethyl esters  1 g Oral Daily   pantoprazole  40 mg Oral Daily   polyethylene glycol  17 g Oral Daily   predniSONE  20 mg Oral Q breakfast   rosuvastatin  40 mg Oral Daily   umeclidinium bromide  1 puff Inhalation Daily   Continuous Infusions:  sodium chloride 10 mL/hr at 01/15/21 2300   azithromycin Stopped (01/15/21 2217)   cefTRIAXone (ROCEPHIN)  IV Stopped (01/16/21 0101)     LOS: 4 days        03/18/21  Juliany Daughety, MD

## 2021-01-16 NOTE — Progress Notes (Signed)
Progress Note  Patient Name: Sierra Guzman Date of Encounter: 01/16/2021  [facility] Cardiologist: Maisie Fus, MD   Subjective   Feels well, appears comfortable in bed  Inpatient Medications    Scheduled Meds:  allopurinol  300 mg Oral Daily   aspirin EC  81 mg Oral Daily   Chlorhexidine Gluconate Cloth  6 each Topical Daily   chlorpheniramine-HYDROcodone  5 mL Oral Q12H   cholecalciferol  1,000 Units Oral Daily   docusate sodium  100 mg Oral BID   fluticasone furoate-vilanterol  1 puff Inhalation Daily   gabapentin  300 mg Oral 3 times per day   heparin injection (subcutaneous)  5,000 Units Subcutaneous Q8H   insulin aspart  0-15 Units Subcutaneous TID WC   loratadine  10 mg Oral Daily   magnesium oxide  400 mg Oral Daily   mouth rinse  15 mL Mouth Rinse BID   metoprolol succinate  25 mg Oral Daily   multivitamin with minerals  1 tablet Oral Daily   omega-3 acid ethyl esters  1 g Oral Daily   pantoprazole  40 mg Oral Daily   polyethylene glycol  17 g Oral Daily   predniSONE  20 mg Oral Q breakfast   rosuvastatin  40 mg Oral Daily   umeclidinium bromide  1 puff Inhalation Daily   Continuous Infusions:  sodium chloride 10 mL/hr at 01/15/21 2300   azithromycin Stopped (01/15/21 2217)   cefTRIAXone (ROCEPHIN)  IV Stopped (01/16/21 0101)   PRN Meds: sodium chloride, acetaminophen, albuterol, guaiFENesin-dextromethorphan, polyethylene glycol   Vital Signs    Vitals:   01/16/21 0400 01/16/21 0759 01/16/21 0807 01/16/21 0912  BP: (!) 140/53   (!) 148/58  Pulse: (!) 57   (!) 59  Resp: 20     Temp: 97.8 F (36.6 C)  97.6 F (36.4 C)   TempSrc: Oral  Oral   SpO2: 100% 99%    Weight:      Height:        Intake/Output Summary (Last 24 hours) at 01/16/2021 1004 Last data filed at 01/15/2021 2300 Gross per 24 hour  Intake 1079.78 ml  Output 250 ml  Net 829.78 ml   Last 3 Weights 01/13/2021 01/11/2021 12/28/2020  Weight (lbs) 154 lb 8.7 oz 161 lb 6 oz 161 lb  6 oz  Weight (kg) 70.1 kg 73.2 kg 73.2 kg      Telemetry    SR PVC - Personally Reviewed  ECG    No new - Personally Reviewed  Physical Exam   GEN: No acute distress.   Neck: No JVD Cardiac: RRR, no murmurs, rubs, or gallops.  Respiratory: Crackles bilaterally. GI: Soft, nontender, non-distended  MS: No edema; No deformity. Neuro:  Nonfocal  Psych: Normal affect   Labs    High Sensitivity Troponin:   Recent Labs  Lab 12/21/20 2215 01/12/21 0000 01/12/21 0205 01/12/21 1748 01/13/21 0553  TROPONINIHS 9 466* 474* 210* 163*     Chemistry Recent Labs  Lab 01/12/21 1748 01/13/21 0247 01/13/21 0553 01/14/21 0249 01/15/21 0038 01/16/21 0253  NA  --  136  --  139 139 137  K  --  3.6  --  3.3* 4.0 4.1  CL  --  106  --  107 104 101  CO2  --  23  --  25 28 28   GLUCOSE  --  104*  --  75 209* 137*  BUN  --  27*  --  29*  29* 24*  CREATININE  --  1.93*  --  2.14* 1.69* 1.36*  CALCIUM  --  8.9  --  9.2 9.3 8.9  MG 1.9 2.0 2.0  --   --   --   PROT  --  6.2*  --  6.1*  --   --   ALBUMIN  --  2.3*  --  2.2*  --   --   AST  --  28  --  49*  --   --   ALT  --  9  --  14  --   --   ALKPHOS  --  69  --  63  --   --   BILITOT  --  0.5  --  0.3  --   --   GFRNONAA  --  26*  --  23* 30* 39*  ANIONGAP  --  7  --  7 7 8     Lipids No results for input(s): CHOL, TRIG, HDL, LABVLDL, LDLCALC, CHOLHDL in the last 168 hours.  Hematology Recent Labs  Lab 01/14/21 0249 01/15/21 0038 01/16/21 0253  WBC 10.0 9.4 10.0  RBC 2.81* 3.08* 3.03*  HGB 8.0* 8.7* 8.7*  HCT 26.1* 28.6* 28.3*  MCV 92.9 92.9 93.4  MCH 28.5 28.2 28.7  MCHC 30.7 30.4 30.7  RDW 18.3* 18.0* 17.8*  PLT 299 290 313   Thyroid No results for input(s): TSH, FREET4 in the last 168 hours.  BNP Recent Labs  Lab 01/12/21 0000  BNP 1,114.8*    DDimer  Recent Labs  Lab 01/12/21 0000  DDIMER 4.44*     Radiology    No results found.  Cardiac Studies   Echo: 01/13/21: 1. Left ventricular ejection  fraction, by estimation, is 45 to 50%. Left  ventricular ejection fraction by 3D volume is 45 %. The left ventricle has  mildly decreased function. The left ventricle has no regional wall motion  abnormalities. Left ventricular   diastolic parameters are consistent with Grade I diastolic dysfunction  (impaired relaxation).   2. Right ventricular systolic function is moderately reduced. The right  ventricular size is mildly enlarged. There is normal pulmonary artery  systolic pressure. The estimated right ventricular systolic pressure is  24.2 mmHg.   3. The mitral valve is grossly normal. Trivial mitral valve  regurgitation.   4. The aortic valve is calcified. There is severe calcifcation of the  aortic valve. There is moderate thickening of the aortic valve. Aortic  valve regurgitation is mild. Mild aortic valve stenosis.   Patient Profile     80 y.o. female with pulmonary fibrosis on home O2 with chronic diastolic heart failure coronary artery disease being seen for elevated troponin  Assessment & Plan    Demand ischemia, elevated troponin - Troponin as high as 400 decreasing now to 100 range. - T wave inversions noted in the anterior leads with known mild disease in the LAD previously. - No chest pain.  Compatible with demand ischemia in the setting of her underlying medical conditions. - Continuing with aspirin 81 mg - Finished heparin IV for a total of 48 hours. Appreciate Pharmacy. - Continue with Crestor high intensity dose, 40 mg. - Continuing with metoprolol succinate 25 mg once a day.   Acute on chronic diastolic/systolic heart failure - Pump function is reduced to 45 to 50% with RV dilated reduced function, mild decline from 12/09/2020. - 40 mg of IV Lasix was administered x1 on 01/14/2021 - No signs of right-sided  heart failure, no plans to redose IV Lasix As she is down 5 pounds on baseline oxygen - Given the underlying pulmonary fibrosis, assessing volume status and  understanding whether or not dyspnea is related to fluid overload is challenging. Has PRN lasix at home.    Acute on chronic respiratory failure with pulmonary fibrosis - On antibiotics.  Productive cough thick sputum.  Primary team following.   Acute on chronic kidney disease stage IIIa - Baseline 1.3-1.5 -Today down to 1.36  Going to sign off. Please call if ?   For questions or updates, please contact [facility] Please consult [email or web address] for contact info under        Signed, Donato Schultz, MD  01/16/2021, 10:04 AM

## 2021-01-17 DIAGNOSIS — I10 Essential (primary) hypertension: Secondary | ICD-10-CM

## 2021-01-17 LAB — GLUCOSE, CAPILLARY
Glucose-Capillary: 105 mg/dL — ABNORMAL HIGH (ref 70–99)
Glucose-Capillary: 231 mg/dL — ABNORMAL HIGH (ref 70–99)

## 2021-01-17 MED ORDER — ASPIRIN 81 MG PO TBEC
81.0000 mg | DELAYED_RELEASE_TABLET | Freq: Every day | ORAL | 11 refills | Status: DC
Start: 2021-01-18 — End: 2022-08-01

## 2021-01-17 MED ORDER — METOPROLOL SUCCINATE ER 25 MG PO TB24: 12.5000 mg | ORAL_TABLET | Freq: Every day | ORAL | Status: DC

## 2021-01-17 MED ORDER — SALINE SPRAY 0.65 % NA SOLN
1.0000 | NASAL | Status: DC | PRN
  Filled 2021-01-17: qty 44

## 2021-01-17 MED ORDER — PREDNISONE 20 MG PO TABS: ORAL_TABLET | ORAL | 0 refills | Status: DC

## 2021-01-17 MED ORDER — ACETAMINOPHEN 325 MG PO TABS: 650.0000 mg | ORAL_TABLET | Freq: Four times a day (QID) | ORAL | Status: DC | PRN

## 2021-01-17 MED ORDER — GUAIFENESIN-DM 100-10 MG/5ML PO SYRP: 5.0000 mL | ORAL_SOLUTION | Freq: Four times a day (QID) | ORAL | 0 refills | Status: DC | PRN

## 2021-01-17 MED ORDER — METOPROLOL SUCCINATE ER 25 MG PO TB24: 12.5000 mg | ORAL_TABLET | Freq: Every day | ORAL | 0 refills | Status: DC

## 2021-01-17 NOTE — Discharge Summary (Signed)
Physician Discharge Summary  Sierra Guzman:[id number] DOB: [birth year] DOA: 01/11/2021  PCP: System, Provider Not In  Admit date: 01/11/2021 Discharge date: 01/17/2021  Admitted From: Home  Disposition:   Home   Recommendations for Outpatient Follow-up and new medication changes:  Follow up with Primary Care in 7 to 10 days. Follow up renal function as outpatient Continue taking furosemide as needed for volume overload. Follow with Pulmonary Clinic for pulmonary fibrosis. Dr Marchelle Gearing.   Decreased dose of metoprolol to 12,5 mg due to bradycardia.   Home Health: yes   Equipment/Devices: home 02, wheelchair, hospital bed    Discharge Condition: stable  CODE STATUS: full  Diet recommendation:  heart healthy   Brief/Interim Summary: Sierra Guzman was admitted to the hospital with the working diagnosis of acute on chronic hypoxemic respiratory failure, in the setting of advanced interstitial lung disease.    80 year old female with past medical history for pulmonary fibrosis, chronic hypoxic respiratory failure who was transferred from skilled nursing facility due to worsening dyspnea.  At home using 2 L of oxygen at rest and 6 L on exertion.  She has been recently placed on systemic corticosteroids taper. On her initial physical examination blood pressure 88/54, heart rate 98, temperature 99.4, respiratory rate 35, oxygen saturation 100% on supplemental oxygen.  Her lungs had bilateral rales, no wheezing, positive conversational dyspnea, heart S1-S2, present, rhythmic, soft abdomen, no lower extremity edema.   Venous pH 7.35, sodium 132, potassium 4.2, chloride 104, bicarb 19, glucose 152, BUN 24, creatinine 1.76, high sensitive troponin [phone].  BNP 1,114. SARS COVID-19 negative.   Her chest radiograph had diffuse bilateral reticulonodular infiltrates.   EKG 106 bpm, left axis deviation, normal intervals, sinus rhythm with PVCs and PACs, no significant ST segment or T wave changes.    Patient was placed on supplemental oxygen per nasal cannula.  High dose of systemic corticosteroids.   Further work-up with CT chest negative for pulm embolism. Patient received diuresis.  Placed on heparin drip for 48 hours for NSTMI.    Transferred to [facility] 10/7.   Patient with improvement in oxygenation and renal function. Her family wants patient to be discharged home with home health services.   Completed antibiotic therapy in the hospital and will continue taper steroids.   Acute on chronic hypoxemic respiratory failure in the setting of pulmonary fibrosis, community-acquired pneumonia.  Patient was admitted to the stepdown unit, she was placed on systemic corticosteroids and broad-spectrum antibiotic therapy. Received supplemental oxygen per high flow nasal cannula along with diuresis with furosemide. Placed on bronchodilator therapy, inhaled corticosteroids, antitussive agents and airway clearing techniques with flutter valve and incentive spirometer.  Patient is dyspnea slowly improved, she has completed 5 days of antibiotic therapy. She has decided to go home with home health services.  Currently her antifibrosis therapy is on hold, patient will follow-up with the pulmonary clinic as an outpatient. Continue taper steroids.  2.  Acute on chronic diastolic heart failure, elevated troponin, non-STEMI, demand ischemia. Patient had no significant chest pain, she was placed on heparin intravenously for anticoagulation for 48 hours. Started on metoprolol succinate 25 mg daily.  Unable to use ACE inhibitors or ARB due to acute kidney injury.  Further work-up with echocardiography showed left ventricle ejection fraction 45 to 50%, RV with moderate reduction in systolic function, RSVP 24.2 mmHg.  Mild aortic valve stenosis.  Patient has been placed on aspirin, follow-up as an outpatient.  3.  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  Hypokalemia.  Patient received  diuresis during her hospitalization, her electrolytes were corrected. Currently she will continue taking furosemide only as needed.  At her discharge sodium 137, potassium 4.1, chloride 101, bicarb 28, glucose 137, bicarb 24, creatinine 1.36.  4.  Type 2 diabetes mellitus, steroid-induced hyperglycemia.  Dyslipidemia.  Patient received insulin sliding scale for glucose coverage and monitoring during her hospitalization. Tolerated diet adequately, at discharge will resume metformin.  Currently steroids being tapered. Continue rosuvastatin.  5.  Chronic anemia.  Anemia of chronic disease.  Her hemoglobin and hematocrit remained stable.  6.  Stage II sacral pressure ulcer.  Present on admission, continue local skin care.  7. Gout. No signs of acute exacerbation, continue with allopurinol.    Discharge Diagnoses:  Principal Problem:   Acute respiratory failure with hypoxia (HCC) Active Problems:   Coronary artery disease   Diabetes type 2, controlled (HCC)   Hypertension   Chronic respiratory failure with hypoxia (HCC)   Chronic diastolic CHF (congestive heart failure) (HCC)   AKI (acute kidney injury) (HCC)   IPF (idiopathic pulmonary fibrosis) (HCC)   CKD (chronic kidney disease) stage 3, GFR 30-59 ml/min (HCC)   Pressure injury of skin    Discharge Instructions   Allergies as of 01/17/2021       Reactions   Morphine And Related Nausea And Vomiting        Medication List     TAKE these medications    acetaminophen 325 MG tablet Commonly known as: TYLENOL Take 2 tablets (650 mg total) by mouth every 6 (six) hours as needed (pain).   albuterol 108 (90 Base) MCG/ACT inhaler Commonly known as: VENTOLIN HFA Inhale 1-2 puffs into the lungs See admin instructions. Give 1 puff inhaled via mask four times a day and also may give 2 puffs every 6 hours as needed for wheezing or shortness of breath   allopurinol 300 MG tablet Commonly known as: ZYLOPRIM Take 300 mg by  mouth daily.   AMINO ACIDS-PROTEIN HYDROLYS PO Take 30 mLs by mouth See admin instructions. Give 36mL by mouth twice a day for wound healing until 02/07/21 23:59   aspirin 81 MG EC tablet Take 1 tablet (81 mg total) by mouth daily. Swallow whole. Start taking on: January 18, 2021   benzonatate 100 MG capsule Commonly known as: TESSALON Take 100 mg by mouth 3 (three) times daily as needed for cough.   cholecalciferol 1000 units tablet Commonly known as: VITAMIN D Take 1,000 Units by mouth daily.   Cinnamon 500 MG capsule Take 500 mg by mouth daily.   docusate sodium 100 MG capsule Commonly known as: COLACE Take 1 capsule (100 mg total) by mouth 2 (two) times daily.   Fish Oil 1000 MG Caps Take 1,000 mg by mouth daily.   Fluticasone-Salmeterol 500-50 MCG/DOSE Aepb Commonly known as: ADVAIR Inhale 1 puff into the lungs every 12 (twelve) hours.   furosemide 20 MG tablet Commonly known as: LASIX Take 1 tablet (20 mg total) by mouth daily as needed for edema.   gabapentin 300 MG capsule Commonly known as: NEURONTIN Take 300 mg by mouth 3 (three) times daily. Given at 0800, 1400, 2000   Gerhardt's butt cream Crea Apply 1 application topically 3 (three) times daily.   GlucoCom Blood Glucose Monitor Devi See admin instructions.   guaiFENesin-dextromethorphan 100-10 MG/5ML syrup Commonly known as: ROBITUSSIN DM Take 5 mLs by mouth every 6 (six) hours as needed for cough.   Incruse  Ellipta 62.5 MCG/INH Aepb Generic drug: umeclidinium bromide Inhale 1 puff into the lungs daily.   liver oil-zinc oxide 40 % ointment Commonly known as: DESITIN Apply to buttocks topically three times a day for wound prevention   loratadine 10 MG tablet Commonly known as: CLARITIN Take 10 mg by mouth daily.   magnesium oxide 400 MG tablet Commonly known as: MAG-OX Take 1 tablet by mouth daily.   metFORMIN 850 MG tablet Commonly known as: Glucophage Take 1 tablet (850 mg total) by  mouth 2 (two) times daily with a meal.   metoprolol succinate 25 MG 24 hr tablet Commonly known as: TOPROL-XL Take 1 tablet (25 mg total) by mouth daily.   Multi-Vitamin tablet Take 1 tablet by mouth daily.   omeprazole 20 MG capsule Commonly known as: PRILOSEC Take 20 mg by mouth daily.   polyethylene glycol 17 g packet Commonly known as: MIRALAX / GLYCOLAX Take 17 g by mouth daily.   predniSONE 20 MG tablet Commonly known as: DELTASONE Take daily for 2 days, then take half tablet daily for 4 days, than stop.   rosuvastatin 40 MG tablet Commonly known as: CRESTOR Take 40 mg by mouth daily.   vitamin B-12 1000 MCG tablet Commonly known as: CYANOCOBALAMIN Take 1,000 mcg by mouth daily.   Vitamin D (Ergocalciferol) 1.25 MG (50000 UNIT) Caps capsule Commonly known as: DRISDOL Take 1 capsule (50,000 Units total) by mouth every 7 (seven) days. What changed: additional instructions               Durable Medical Equipment  (From admission, onward)           Start     Ordered   01/15/21 1425  For home use only DME Suction  Once       Question:  Suction  Answer:  Oral   01/15/21 1425   01/15/21 1425  For home use only DME standard manual wheelchair with seat cushion  Once       Comments: Patient suffers from pulmonary fibrosis which impairs their ability to perform daily activities like bathing, dressing, and grooming in the home.  A walker will not resolve issue with performing activities of daily living. A wheelchair will allow patient to safely perform daily activities. Patient can safely propel the wheelchair in the home or has a caregiver who can provide assistance. Length of need 12 months . Accessories: elevating leg rests (ELRs), wheel locks, extensions and anti-tippers.   01/15/21 1425   01/15/21 0953  For home use only DME Walker rolling  Once       Question Answer Comment  Walker: With 5 Inch Wheels   Patient needs a walker to treat with the following  condition Dyspnea and respiratory abnormalities      01/15/21 0952   01/15/21 0952  For home use only DME Hospital bed  Once       Question Answer Comment  Length of Need 6 Months   Head must be elevated greater than: 45 degrees   Bed type Semi-electric      01/15/21 0952   01/15/21 0952  For home use only DME Bedside commode  Once       Question:  Patient needs a bedside commode to treat with the following condition  Answer:  Dyspnea and respiratory abnormalities   01/15/21 0952   01/15/21 0952  For home use only DME Shower stool  Once        01/15/21 9604  Allergies  Allergen Reactions   Morphine And Related Nausea And Vomiting    Consultations: Pulmonary and cardiology    Procedures/Studies: CT Angio Chest Pulmonary Embolism (PE) W or WO Contrast  Result Date: 01/12/2021 CLINICAL DATA:  Shortness of breath. EXAM: CT ANGIOGRAPHY CHEST WITH CONTRAST TECHNIQUE: Multidetector CT imaging of the chest was performed using the standard protocol during bolus administration of intravenous contrast. Multiplanar CT image reconstructions and MIPs were obtained to evaluate the vascular anatomy. CONTRAST:  40mL OMNIPAQUE IOHEXOL 350 MG/ML SOLN COMPARISON:  December 06, 2020 FINDINGS: Cardiovascular: There is moderate to marked severity calcification of the aortic arch, without evidence of aortic aneurysm or dissection. The subsegmental pulmonary arteries are limited in evaluation secondary to patient motion and areas of overlying artifact. This is most prominent within the bilateral lung bases. No evidence of pulmonary embolism. Normal heart size with marked severity coronary artery calcification. No pericardial effusion. Mediastinum/Nodes: There is moderate severity mediastinal and right hilar lymphadenopathy with a 3.4 cm x 2.4 cm right paratracheal lymph node noted. This measures approximately 1.5 cm on the prior study. Thyroid gland, trachea, and esophagus demonstrate no significant  findings. Lungs/Pleura: Stable, marked severity diffuse interstitial lung disease is noted bilaterally. This is most prominent within the bilateral lower lobes. Associated bronchiectatic changes are noted. Mild to moderate severity superimposed infiltrates are suspected within the posterior aspects of the bilateral upper lobes. There is no evidence of a pleural effusion or pneumothorax. Upper Abdomen: There is a large, stable gastric hernia. A stable 3.3 cm diameter simple cyst is seen within the upper pole of the left kidney. Musculoskeletal: Multilevel degenerative changes seen throughout the thoracic spine. Review of the MIP images confirms the above findings. IMPRESSION: 1. Marked severity chronic interstitial lung disease. 2. Mild to moderate severity superimposed bilateral upper lobe infiltrates. 3. No evidence of pulmonary embolism. 4. Large, stable gastric hernia. 5. Stable 3.3 cm diameter simple cyst within the upper pole of the left kidney. 6. Aortic atherosclerosis. Aortic Atherosclerosis (ICD10-I70.0). Electronically Signed   By: Aram Candela M.D.   On: 01/12/2021 03:37   US RENAL  Result Date: 01/12/2021 CLINICAL DATA:  Initial evaluation for acute kidney injury. EXAM: RENAL / URINARY TRACT ULTRASOUND COMPLETE COMPARISON:  Ultrasound from 12/30/2020. FINDINGS: Right Kidney: Renal measurements: 9.2 x 4.6 x 4.4 cm = volume: 97.9 mL. Increased echogenicity within the renal parenchyma. No nephrolithiasis or hydronephrosis. 6.4 x 5.0 x 5.1 cm simple cyst present at the lower pole. An adjacent smaller simple cyst measures 2.7 x 2.6 x 2.1 cm. No solid renal mass. Left Kidney: Renal measurements: 9.8 x 4.8 x 5.7 cm = volume: 139.2 mL. Increased echogenicity within the renal parenchyma. No nephrolithiasis or hydronephrosis. 3.4 x 2.8 x 3.0 cm complex cyst with internal septations present at the upper pole. No internal vascularity or solid component. This is stable from prior. Bladder: Appears normal for  degree of bladder distention. Other: None. IMPRESSION: 1. No hydronephrosis. 2. Increased echogenicity within the renal parenchyma, compatible with medical renal disease. 3. Bilateral renal cysts as above, including a 3.4 cm complex septated cyst at the upper pole. These are felt to be benign, and are similar to previous. Electronically Signed   By: Rise Mu M.D.   On: 01/12/2021 04:36   US RENAL  Result Date: 12/30/2020 CLINICAL DATA:  Acute kidney injury. History of chronic kidney disease. EXAM: RENAL / URINARY TRACT ULTRASOUND COMPLETE COMPARISON:  None. FINDINGS: Right Kidney: Renal measurements: 9.1 x 4.2 x 6.9  cm = volume: 137 mL. No hydronephrosis. Thinning of the renal parenchyma with increased parenchymal echogenicity. Cyst arising from the lower pole measures 5.4 x 5.6 x 5.3 cm, with a small adjacent subcentimeter cyst. There is also a 6 x 9 x 6 mm cyst in the mid kidney. No visualized solid lesion or stone. Left Kidney: Renal measurements: 8.9 x 5.5 x 5.4 cm = volume: 137 mL. No hydronephrosis. Mild thinning of the renal parenchyma and increased renal parenchymal echogenicity. Complex cyst in the upper pole measures 2.8 x 2.7 x 2.8 cm with thin internal septations. No septal vascularity. No evidence of solid lesion or stone. Bladder: Appears normal for degree of bladder distention. Other: None. IMPRESSION: 1. Increased renal parenchymal echogenicity in thinning of the renal cortex consistent with chronic medical renal disease. No hydronephrosis. 2. Bilateral renal cysts, including a septated cyst in the left kidney measuring 2.8 cm. The cysts are considered benign, and no dedicated imaging follow-up is needed. Electronically Signed   By: Narda Rutherford M.D.   On: 12/30/2020 19:02   DG Chest Port 1 View  Result Date: 01/11/2021 CLINICAL DATA:  Shortness of breath. EXAM: PORTABLE CHEST 1 VIEW COMPARISON:  Chest x-ray 12/28/2018. FINDINGS: Cardiomediastinal silhouette is stable. There  are patchy multifocal airspace opacities bilaterally as well as diffuse interstitial prominence. Airspace opacities are new in the right mid and lower lung. There is a small left pleural effusion. There is no pneumothorax. No acute fractures are seen. IMPRESSION: 1. Chronic interstitial lung disease. New patchy airspace opacities in the right upper and lower lung worrisome for superimposed infection. 2. Small left pleural effusion. Electronically Signed   By: Darliss Cheney M.D.   On: 01/11/2021 23:35   DG Chest Portable 1 View  Result Date: 12/27/2020 CLINICAL DATA:  Generalized weakness and tremors. EXAM: PORTABLE CHEST 1 VIEW COMPARISON:  December 21, 2020 FINDINGS: Stable, chronic interstitial lung disease is seen throughout both lungs. There is no evidence of focal consolidation, pleural effusion or pneumothorax. The heart size and mediastinal contours are within normal limits. The visualized skeletal structures are unremarkable. IMPRESSION: Chronic interstitial lung disease without evidence of acute or active cardiopulmonary disease. Electronically Signed   By: Aram Candela M.D.   On: 12/27/2020 18:24   DG Chest Portable 1 View  Result Date: 12/21/2020 CLINICAL DATA:  Chest pain EXAM: PORTABLE CHEST 1 VIEW COMPARISON:  12/17/2020 FINDINGS: Chronic interstitial markings with subpleural reticulation/fibrosis in the lungs bilaterally. No pleural effusion or pneumothorax. The heart is normal in size. IMPRESSION: Stable chronic interstitial lung disease. Electronically Signed   By: Charline Bills M.D.   On: 12/21/2020 20:08   ECHOCARDIOGRAM COMPLETE  Result Date: 01/13/2021    ECHOCARDIOGRAM REPORT   Patient Name:   AYRIS CARANO Date of Exam: 01/13/2021 Medical Rec #:  [id number]    Height:       67.0 in Accession #:    [id number]   Weight:       154.5 lb Date of Birth:  05-27-1940    BSA:          1.812 m Patient Age:    80 years     BP:           94/59 mmHg Patient Gender: F            HR:            72 bpm. Exam Location:  Inpatient Procedure: 2D Echo, 3D Echo, Cardiac Doppler and Color Doppler Indications:  Acute respiratory distress. ; R06.02 SOB  History:        Patient has prior history of Echocardiogram examinations, most                 recent 12/09/2020. CHF, CAD, Abnormal ECG,                 Signs/Symptoms:Shortness of Breath and Dyspnea; Risk                 Factors:Diabetes. Hypoxia.  Sonographer:    Sheralyn Boatman RDCS Referring Phys: [phone] [facility]  Sonographer Comments: Technically difficult study due to poor echo windows. IMPRESSIONS  1. Left ventricular ejection fraction, by estimation, is 45 to 50%. Left ventricular ejection fraction by 3D volume is 45 %. The left ventricle has mildly decreased function. The left ventricle has no regional wall motion abnormalities. Left ventricular  diastolic parameters are consistent with Grade I diastolic dysfunction (impaired relaxation).  2. Right ventricular systolic function is moderately reduced. The right ventricular size is mildly enlarged. There is normal pulmonary artery systolic pressure. The estimated right ventricular systolic pressure is 24.2 mmHg.  3. The mitral valve is grossly normal. Trivial mitral valve regurgitation.  4. The aortic valve is calcified. There is severe calcifcation of the aortic valve. There is moderate thickening of the aortic valve. Aortic valve regurgitation is mild. Mild aortic valve stenosis. FINDINGS  Left Ventricle: Left ventricular ejection fraction, by estimation, is 45 to 50%. Left ventricular ejection fraction by 3D volume is 45 %. The left ventricle has mildly decreased function. The left ventricle has no regional wall motion abnormalities. The  left ventricular internal cavity size was normal in size. There is no left ventricular hypertrophy. Left ventricular diastolic parameters are consistent with Grade I diastolic dysfunction (impaired relaxation). Right Ventricle: The right ventricular size is mildly  enlarged. Right vetricular wall thickness was not well visualized. Right ventricular systolic function is moderately reduced. There is normal pulmonary artery systolic pressure. The tricuspid regurgitant velocity is 2.30 m/s, and with an assumed right atrial pressure of 3 mmHg, the estimated right ventricular systolic pressure is 24.2 mmHg. Left Atrium: Left atrial size was normal in size. Right Atrium: Right atrial size was normal in size. Pericardium: There is no evidence of pericardial effusion. Mitral Valve: The mitral valve is grossly normal. Trivial mitral valve regurgitation. Tricuspid Valve: The tricuspid valve is grossly normal. Tricuspid valve regurgitation is mild. Aortic Valve: The aortic valve is calcified. There is severe calcifcation of the aortic valve. There is moderate thickening of the aortic valve. There is moderate aortic valve annular calcification. Aortic valve regurgitation is mild. Mild aortic stenosis is present. Aortic valve mean gradient measures 10.7 mmHg. Aortic valve peak gradient measures 19.1 mmHg. Aortic valve area, by VTI measures 3.19 cm. Pulmonic Valve: The pulmonic valve was grossly normal. Pulmonic valve regurgitation is mild. Aorta: The aortic root and ascending aorta are structurally normal, with no evidence of dilitation. IAS/Shunts: The atrial septum is grossly normal.  LEFT VENTRICLE PLAX 2D LVIDd:         4.00 cm         Diastology LVIDs:         2.80 cm         LV e' medial:    5.87 cm/s LV PW:         1.00 cm         LV E/e' medial:  10.2 LV IVS:  1.40 cm         LV e' lateral:   7.07 cm/s LVOT diam:     2.20 cm         LV E/e' lateral: 8.5 LV SV:         146 LV SV Index:   81 LVOT Area:     3.80 cm        3D Volume EF                                LV 3D EF:    Left                                             ventricul LV Volumes (MOD)                            ar LV vol d, MOD    48.3 ml                    ejection A2C:                                         fraction LV vol d, MOD    62.7 ml                    by 3D A4C:                                        volume is LV vol s, MOD    17.1 ml                    45 %. A2C: LV vol s, MOD    31.5 ml A4C:                           3D Volume EF: LV SV MOD A2C:   31.2 ml       3D EF:        45 % LV SV MOD A4C:   62.7 ml       LV EDV:       94 ml LV SV MOD BP:    33.0 ml       LV ESV:       52 ml                                LV SV:        43 ml RIGHT VENTRICLE             IVC RV S prime:     11.52 cm/s  IVC diam: 2.50 cm TAPSE (M-mode): 2.3 cm LEFT ATRIUM           Index        RIGHT ATRIUM           Index LA diam:      3.50 cm 1.93 cm/m   RA Area:  16.90 cm LA Vol (A2C): 16.0 ml 8.83 ml/m   RA Volume:   40.80 ml  22.51 ml/m LA Vol (A4C): 29.4 ml 16.22 ml/m  AORTIC VALVE                     PULMONIC VALVE AV Area (Vmax):    3.25 cm      PR End Diast Vel: 2.60 msec AV Area (Vmean):   3.17 cm AV Area (VTI):     3.19 cm AV Vmax:           218.67 cm/s AV Vmean:          153.667 cm/s AV VTI:            0.459 m AV Peak Grad:      19.1 mmHg AV Mean Grad:      10.7 mmHg LVOT Vmax:         187.00 cm/s LVOT Vmean:        128.000 cm/s LVOT VTI:          0.385 m LVOT/AV VTI ratio: 0.84  AORTA Ao Root diam: 3.60 cm Ao Asc diam:  3.50 cm MITRAL VALVE               TRICUSPID VALVE MV Area (PHT): 3.91 cm    TR Peak grad:   21.2 mmHg MV Decel Time: 194 msec    TR Vmax:        230.00 cm/s MV E velocity: 60.00 cm/s MV A velocity: 98.50 cm/s  SHUNTS MV E/A ratio:  0.61        Systemic VTI:  0.38 m                            Systemic Diam: 2.20 cm Kristeen Miss MD Electronically signed by Kristeen Miss MD Signature Date/Time: 01/13/2021/2:34:56 PM    Final      Subjective: Patient feeling better, her dyspnea is at baseline, no chest pain, no nausea or vomiting.   Discharge Exam: Vitals:   01/17/21 0802 01/17/21 0824  BP:    Pulse:    Resp:    Temp: 98.3 F (36.8 C)   SpO2:  99%   Vitals:   01/17/21 0600 01/17/21 0700  01/17/21 0802 01/17/21 0824  BP: (!) 145/52 (!) 151/56    Pulse: (!) 45 (!) 54    Resp: (!) 26 16    Temp:   98.3 F (36.8 C)   TempSrc:   Oral   SpO2: 100% 100%  99%  Weight:      Height:        General: Not in pain or dyspnea.  Neurology: Awake and alert, non focal  E ENT: no pallor, no icterus, oral mucosa moist Cardiovascular: No JVD. S1-S2 present, rhythmic, no gallops, rubs, or murmurs. No lower extremity edema. Pulmonary: positive breath sounds bilaterally, no wheezing, or rhonchi diffuse rales. Gastrointestinal. Abdomen soft and non tender Skin. No rashes Musculoskeletal: no joint deformities   The results of significant diagnostics from this hospitalization (including imaging, microbiology, ancillary and laboratory) are listed below for reference.     Microbiology: Recent Results (from the past 240 hour(s))  Resp Panel by RT-PCR (Flu A&B, Covid) Nasopharyngeal Swab     Status: None   Collection Time: 01/11/21 11:10 PM   Specimen: Nasopharyngeal Swab; Nasopharyngeal(NP) swabs in vial transport medium  Result Value Ref Range Status   SARS Coronavirus 2 by  RT PCR NEGATIVE NEGATIVE Final    Comment: (NOTE) SARS-CoV-2 target nucleic acids are NOT DETECTED.  The SARS-CoV-2 RNA is generally detectable in upper respiratory specimens during the acute phase of infection. The lowest concentration of SARS-CoV-2 viral copies this assay can detect is 138 copies/mL. A negative result does not preclude SARS-Cov-2 infection and should not be used as the sole basis for treatment or other patient management decisions. A negative result may occur with  improper specimen collection/handling, submission of specimen other than nasopharyngeal swab, presence of viral mutation(s) within the areas targeted by this assay, and inadequate number of viral copies(<138 copies/mL). A negative result must be combined with clinical observations, patient history, and epidemiological information.  The expected result is Negative.  Fact Sheet for Patients:  BloggerCourse.com  Fact Sheet for Healthcare Providers:  SeriousBroker.it  This test is no t yet approved or cleared by the [place] FDA and  has been authorized for detection and/or diagnosis of SARS-CoV-2 by FDA under an Emergency Use Authorization (EUA). This EUA will remain  in effect (meaning this test can be used) for the duration of the COVID-19 declaration under Section 564(b)(1) of the Act, 21 U.S.C.section 360bbb-3(b)(1), unless the authorization is terminated  or revoked sooner.       Influenza A by PCR NEGATIVE NEGATIVE Final   Influenza B by PCR NEGATIVE NEGATIVE Final    Comment: (NOTE) The Xpert Xpress SARS-CoV-2/FLU/RSV plus assay is intended as an aid in the diagnosis of influenza from Nasopharyngeal swab specimens and should not be used as a sole basis for treatment. Nasal washings and aspirates are unacceptable for Xpert Xpress SARS-CoV-2/FLU/RSV testing.  Fact Sheet for Patients: BloggerCourse.com  Fact Sheet for Healthcare Providers: SeriousBroker.it  This test is not yet approved or cleared by the [place] FDA and has been authorized for detection and/or diagnosis of SARS-CoV-2 by FDA under an Emergency Use Authorization (EUA). This EUA will remain in effect (meaning this test can be used) for the duration of the COVID-19 declaration under Section 564(b)(1) of the Act, 21 U.S.C. section 360bbb-3(b)(1), unless the authorization is terminated or revoked.  Performed at [facility], [address], [place], [place] [address]   Expectorated Sputum Assessment w Gram Stain, Rflx to Resp Cult     Status: None   Collection Time: 01/12/21  2:50 AM   Specimen: Expectorated Sputum  Result Value Ref Range Status   Specimen Description EXPECTORATED SPUTUM  Final   Special  Requests NONE  Final   Sputum evaluation   Final    THIS SPECIMEN IS ACCEPTABLE FOR SPUTUM CULTURE Performed at [facility], [address] [place], [place] [address]    Report Status 01/12/2021 FINAL  Final  Culture, Respiratory w Gram Stain     Status: None   Collection Time: 01/12/21  2:50 AM  Result Value Ref Range Status   Specimen Description   Final    EXPECTORATED SPUTUM Performed at [facility], [address], [place], [place] [address]    Special Requests   Final    NONE Reflexed from [id number] Performed at [facility], [address] [place], [place] [address]    Gram Stain   Final    RARE WBC PRESENT,BOTH PMN AND MONONUCLEAR ABUNDANT GRAM POSITIVE COCCI IN PAIRS RARE BUDDING YEAST SEEN FEW GRAM NEGATIVE COCCOBACILLI    Culture   Final    Normal respiratory flora-no Staph aureus or Pseudomonas seen Performed at  [facility], [address], [place], [place] [address]    Report Status 01/14/2021 FINAL  Final  MRSA Next Gen by PCR, Nasal     Status: None   Collection Time: 01/12/21  8:31 AM   Specimen: Nasal Mucosa; Nasal Swab  Result Value Ref Range Status   MRSA by PCR Next Gen NOT DETECTED NOT DETECTED Final    Comment: (NOTE) The GeneXpert MRSA Assay (FDA approved for NASAL specimens only), is one component of a comprehensive MRSA colonization surveillance program. It is not intended to diagnose MRSA infection nor to guide or monitor treatment for MRSA infections. Test performance is not FDA approved in patients less than 102 years old. Performed at [facility], [address] [place], [place] [address]      Labs: BNP (last 3 results) Recent Labs    12/04/20 2330 12/27/20 1813 01/12/21 0000  BNP 87.1 32.1 1,114.8*   Basic Metabolic Panel: Recent Labs  Lab 01/12/21 0433 01/12/21 1748 01/13/21 0247 01/13/21 0553 01/14/21 0249 01/15/21 0038 01/16/21 0253  NA  135  --  136  --  139 139 137  K 3.8  --  3.6  --  3.3* 4.0 4.1  CL 105  --  106  --  107 104 101  CO2 21*  --  23  --  GLUCOSE 110*  --  104*  --  75 209* 137*  BUN 22  --  27*  --  29* 29* 24*  CREATININE 1.74*  --  1.93*  --  2.14* 1.69* 1.36*  CALCIUM 8.3*  --  8.9  --  9.2 9.3 8.9  MG  --  1.9 2.0 2.0  --   --   --   PHOS  --   --  3.9  --  3.2  --   --    Liver Function Tests: Recent Labs  Lab 01/13/21 0247 01/14/21 0249  AST 28 49*  ALT 9 14  ALKPHOS 69 63  BILITOT 0.5 0.3  PROT 6.2* 6.1*  ALBUMIN 2.3* 2.2*   No results for input(s): LIPASE, AMYLASE in the last 168 hours. No results for input(s): AMMONIA in the last 168 hours. CBC: Recent Labs  Lab 01/12/21 0433 01/13/21 0247 01/14/21 0249 01/15/21 0038 01/16/21 0253  WBC 11.4* 9.1 10.0 9.4 10.0  HGB 9.8* 8.6* 8.0* 8.7* 8.7*  HCT 31.7* 27.4* 26.1* 28.6* 28.3*  MCV 93.5 93.2 92.9 92.9 93.4  PLT 295 279 299 290 313   Cardiac Enzymes: No results for input(s): CKTOTAL, CKMB, CKMBINDEX, TROPONINI in the last 168 hours. BNP: Invalid input(s): POCBNP CBG: Recent Labs  Lab 01/16/21 0810 01/16/21 1157 01/16/21 1622 01/16/21 2124 01/17/21 0757  GLUCAP 109* 219* 224* 155* 105*   D-Dimer No results for input(s): DDIMER in the last 72 hours. Hgb A1c No results for input(s): HGBA1C in the last 72 hours. Lipid Profile No results for input(s): CHOL, HDL, LDLCALC, TRIG, CHOLHDL, LDLDIRECT in the last 72 hours. Thyroid function studies No results for input(s): TSH, T4TOTAL, T3FREE, THYROIDAB in the last 72 hours.  Invalid input(s): FREET3 Anemia work up No results for input(s): VITAMINB12, FOLATE, FERRITIN, TIBC, IRON, RETICCTPCT in the last 72 hours. Urinalysis    Component Value Date/Time   COLORURINE YELLOW 12/27/2020 2005   APPEARANCEUR CLEAR 12/27/2020 2005   LABSPEC 1.010 12/27/2020 2005   PHURINE 6.0 12/27/2020 2005   GLUCOSEU NEGATIVE 12/27/2020 2005   HGBUR NEGATIVE 12/27/2020 2005  BILIRUBINUR NEGATIVE 12/27/2020 2005   KETONESUR NEGATIVE 12/27/2020 2005   PROTEINUR NEGATIVE 12/27/2020 2005   NITRITE NEGATIVE 12/27/2020 2005   LEUKOCYTESUR MODERATE (A) 12/27/2020 2005   Sepsis Labs Invalid input(s): PROCALCITONIN,  WBC,  LACTICIDVEN Microbiology Recent Results (from the past 240 hour(s))  Resp Panel by RT-PCR (Flu A&B, Covid) Nasopharyngeal Swab     Status: None   Collection Time: 01/11/21 11:10 PM   Specimen: Nasopharyngeal Swab; Nasopharyngeal(NP) swabs in vial transport medium  Result Value Ref Range Status   SARS Coronavirus 2 by RT PCR NEGATIVE NEGATIVE Final    Comment: (NOTE) SARS-CoV-2 target nucleic acids are NOT DETECTED.  The SARS-CoV-2 RNA is generally detectable in upper respiratory specimens during the acute phase of infection. The lowest concentration of SARS-CoV-2 viral copies this assay can detect is 138 copies/mL. A negative result does not preclude SARS-Cov-2 infection and should not be used as the sole basis for treatment or other patient management decisions. A negative result may occur with  improper specimen collection/handling, submission of specimen other than nasopharyngeal swab, presence of viral mutation(s) within the areas targeted by this assay, and inadequate number of viral copies(<138 copies/mL). A negative result must be combined with clinical observations, patient history, and epidemiological information. The expected result is Negative.  Fact Sheet for Patients:  BloggerCourse.com  Fact Sheet for Healthcare Providers:  SeriousBroker.it  This test is no t yet approved or cleared by the [place] FDA and  has been authorized for detection and/or diagnosis of SARS-CoV-2 by FDA under an Emergency Use Authorization (EUA). This EUA will remain  in effect (meaning this test can be used) for the duration of the COVID-19 declaration under Section 564(b)(1) of the Act,  21 U.S.C.section 360bbb-3(b)(1), unless the authorization is terminated  or revoked sooner.       Influenza A by PCR NEGATIVE NEGATIVE Final   Influenza B by PCR NEGATIVE NEGATIVE Final    Comment: (NOTE) The Xpert Xpress SARS-CoV-2/FLU/RSV plus assay is intended as an aid in the diagnosis of influenza from Nasopharyngeal swab specimens and should not be used as a sole basis for treatment. Nasal washings and aspirates are unacceptable for Xpert Xpress SARS-CoV-2/FLU/RSV testing.  Fact Sheet for Patients: BloggerCourse.com  Fact Sheet for Healthcare Providers: SeriousBroker.it  This test is not yet approved or cleared by the [place] FDA and has been authorized for detection and/or diagnosis of SARS-CoV-2 by FDA under an Emergency Use Authorization (EUA). This EUA will remain in effect (meaning this test can be used) for the duration of the COVID-19 declaration under Section 564(b)(1) of the Act, 21 U.S.C. section 360bbb-3(b)(1), unless the authorization is terminated or revoked.  Performed at [facility], [address] [place], [place] [address]   Expectorated Sputum Assessment w Gram Stain, Rflx to Resp Cult     Status: None   Collection Time: 01/12/21  2:50 AM   Specimen: Expectorated Sputum  Result Value Ref Range Status   Specimen Description EXPECTORATED SPUTUM  Final   Special Requests NONE  Final   Sputum evaluation   Final    THIS SPECIMEN IS ACCEPTABLE FOR SPUTUM CULTURE Performed at [facility], [address], [place], [place] [address]    Report Status 01/12/2021 FINAL  Final  Culture, Respiratory w Gram Stain     Status: None   Collection Time: 01/12/21  2:50 AM  Result Value Ref Range Status   Specimen Description   Final    EXPECTORATED  SPUTUM Performed at [facility], [address], [place], [place] [address]    Special Requests   Final     NONE Reflexed from [id number] Performed at [facility], [address], [place], [place] [address]    Gram Stain   Final    RARE WBC PRESENT,BOTH PMN AND MONONUCLEAR ABUNDANT GRAM POSITIVE COCCI IN PAIRS RARE BUDDING YEAST SEEN FEW GRAM NEGATIVE COCCOBACILLI    Culture   Final    Normal respiratory flora-no Staph aureus or Pseudomonas seen Performed at [facility], [address], [place], [place] [address]    Report Status 01/14/2021 FINAL  Final  MRSA Next Gen by PCR, Nasal     Status: None   Collection Time: 01/12/21  8:31 AM   Specimen: Nasal Mucosa; Nasal Swab  Result Value Ref Range Status   MRSA by PCR Next Gen NOT DETECTED NOT DETECTED Final    Comment: (NOTE) The GeneXpert MRSA Assay (FDA approved for NASAL specimens only), is one component of a comprehensive MRSA colonization surveillance program. It is not intended to diagnose MRSA infection nor to guide or monitor treatment for MRSA infections. Test performance is not FDA approved in patients less than 19 years old. Performed at [facility], [address] [place], [place] [address]      Time coordinating discharge: 45 minutes  SIGNED:   Coralie Keens, MD  [facility] 01/17/2021, 9:29 AM

## 2021-01-17 NOTE — Progress Notes (Signed)
Patient has been provided discharge instructions, IV has been removed, & her daughter [name] is here to pick her up. Vital signs are stable and she is on [facility]. Her home oxygen tank is with her.  Anda Kraft, RN 01/17/2021

## 2021-01-17 NOTE — TOC Transition Note (Signed)
Transition of Care [facility]) - CM/SW Discharge Note   Patient Details  Name: Sierra Guzman MRN: [id number] Date of Birth: [birth year]  Transition of Care [facility]) CM/SW Contact:  Barry Brunner, LCSW Phone Number: 01/17/2021, 12:50 PM   Clinical Narrative:    CSW notified of patient's readiness for discharge and DME needs. CSW contacted Ashly with [employer]. Qualification and order updated. Ashly agreeable to provide O2 tanks. CSW notified [facility] of readiness for discharge. Clifton Custard with [facility] agreeable to resume services. TOC signing off.    Final next level of care: Home w Home Health Services Barriers to Discharge: Barriers Resolved   Patient Goals and CMS Choice Patient states their goals for this hospitalization and ongoing recovery are:: Return home with [facility] CMS Medicare.gov Compare Post Acute Care list provided to:: Patient Choice offered to / list presented to : Patient  Discharge Placement                Patient to be transferred to facility by: [facility] transportation Name of family member notified: Dorma Russell (Daughter)   [phone] Patient and family notified of of transfer: 01/17/21  Discharge Plan and Services     Post Acute Care Choice: Home Health          DME Arranged: Oxygen DME Agency: Patsy Lager Date DME Agency Contacted: 01/17/21 Time DME Agency Contacted: 1248 Representative spoke with at DME Agency: Ashly HH Arranged: PT, RN HH Agency: [facility] Date [facility] Agency Contacted: 01/17/21 Time HH Agency Contacted: 1249 Representative spoke with at [facility] Agency: Clifton Custard  Social Determinants of Health (SDOH) Interventions     Readmission Risk Interventions No flowsheet data found.

## 2021-01-19 ENCOUNTER — Other Ambulatory Visit: Payer: Self-pay | Admitting: Internal Medicine

## 2021-01-19 MED ORDER — METFORMIN HCL 850 MG PO TABS: 850.0000 mg | ORAL_TABLET | Freq: Two times a day (BID) | ORAL | 11 refills | Status: DC

## 2021-01-19 NOTE — Progress Notes (Unsigned)
  Progress Note    Sierra Guzman   CHY:[id number]  DOB: 11/07/1940  DOA: (Not on file)     0 Date of Service: 01/19/2021     Subjective:  {CHL Clinical Course:26391}  Hospital Problems @LASTPROBAPNOTESCHL @   Objective {Vitals:26382}  Exam Physical Exam ***  Labs / Other Information {Results:26384}    Time spent: *** [facility] 01/19/2021, 2:49 PM

## 2021-01-27 NOTE — Progress Notes (Addendum)
Due to patient's pulmonary fibrosis, patient needs head of bed elevated 30 degrees or higher which is not feasible in a normal bed.   Patient suffers from pulmonary fibrosis which impairs their ability to perform daily activities like bathing, dressing, and grooming in the home.  A walker will not resolve issue with performing activities of daily living. A wheelchair will allow patient to safely perform daily activities. Patient can safely propel the wheelchair in the home or has a caregiver who can provide assistance. Length of need 12 months .  Accessories: elevating leg rests (ELRs), wheel locks, extensions and anti-tippers.

## 2021-01-27 NOTE — Progress Notes (Signed)
CM received call from patient's daughter [name] stating DME has not been delivered to the home.  CM reached out to [employer] rep Morrie Sheldon who states agency did not receive referral and does not have a hospital bed available.  CM spoke with [employer] rep Ian Malkin and provided referral.  [employer] to deliver equipment to patients home, contact information for patient's daughter provided to [employer] rep.

## 2021-02-04 ENCOUNTER — Inpatient Hospital Stay: Payer: Medicare HMO | Admitting: Primary Care

## 2021-02-07 ENCOUNTER — Other Ambulatory Visit: Payer: Self-pay | Admitting: Pulmonary Disease

## 2021-02-07 DIAGNOSIS — R590 Localized enlarged lymph nodes: Secondary | ICD-10-CM

## 2021-02-11 ENCOUNTER — Telehealth: Payer: Self-pay | Admitting: Primary Care

## 2021-02-11 NOTE — Telephone Encounter (Signed)
According to records, it appears that patient sees Dr. Karna Christmas. I have recommended that she contact Dr. Terence Lux office regarding POC. She voiced her understanding and had no further questions.  Nothing further needed.

## 2021-03-07 ENCOUNTER — Emergency Department: Payer: Medicare HMO

## 2021-03-07 ENCOUNTER — Inpatient Hospital Stay
Admission: EM | Admit: 2021-03-07 | Discharge: 2021-03-10 | DRG: 871 | Disposition: A | Discharge status: Home Health | Payer: Medicare HMO | Attending: Internal Medicine | Admitting: Internal Medicine

## 2021-03-07 ENCOUNTER — Other Ambulatory Visit: Payer: Self-pay

## 2021-03-07 DIAGNOSIS — J45909 Unspecified asthma, uncomplicated: Secondary | ICD-10-CM | POA: Diagnosis present

## 2021-03-07 DIAGNOSIS — L89152 Pressure ulcer of sacral region, stage 2: Secondary | ICD-10-CM | POA: Diagnosis present

## 2021-03-07 DIAGNOSIS — Z885 Allergy status to narcotic agent status: Secondary | ICD-10-CM

## 2021-03-07 DIAGNOSIS — I5032 Chronic diastolic (congestive) heart failure: Secondary | ICD-10-CM | POA: Diagnosis present

## 2021-03-07 DIAGNOSIS — N1831 Chronic kidney disease, stage 3a: Secondary | ICD-10-CM | POA: Diagnosis present

## 2021-03-07 DIAGNOSIS — E1122 Type 2 diabetes mellitus with diabetic chronic kidney disease: Secondary | ICD-10-CM | POA: Diagnosis present

## 2021-03-07 DIAGNOSIS — A419 Sepsis, unspecified organism: Principal | ICD-10-CM

## 2021-03-07 DIAGNOSIS — Z7982 Long term (current) use of aspirin: Secondary | ICD-10-CM

## 2021-03-07 DIAGNOSIS — Z823 Family history of stroke: Secondary | ICD-10-CM

## 2021-03-07 DIAGNOSIS — R6520 Severe sepsis without septic shock: Secondary | ICD-10-CM | POA: Diagnosis present

## 2021-03-07 DIAGNOSIS — R0902 Hypoxemia: Secondary | ICD-10-CM

## 2021-03-07 DIAGNOSIS — Z8719 Personal history of other diseases of the digestive system: Secondary | ICD-10-CM

## 2021-03-07 DIAGNOSIS — Z96643 Presence of artificial hip joint, bilateral: Secondary | ICD-10-CM | POA: Diagnosis present

## 2021-03-07 DIAGNOSIS — F419 Anxiety disorder, unspecified: Secondary | ICD-10-CM | POA: Diagnosis present

## 2021-03-07 DIAGNOSIS — I2510 Atherosclerotic heart disease of native coronary artery without angina pectoris: Secondary | ICD-10-CM | POA: Diagnosis present

## 2021-03-07 DIAGNOSIS — J84112 Idiopathic pulmonary fibrosis: Secondary | ICD-10-CM | POA: Diagnosis present

## 2021-03-07 DIAGNOSIS — J101 Influenza due to other identified influenza virus with other respiratory manifestations: Secondary | ICD-10-CM | POA: Diagnosis present

## 2021-03-07 DIAGNOSIS — K219 Gastro-esophageal reflux disease without esophagitis: Secondary | ICD-10-CM | POA: Diagnosis present

## 2021-03-07 DIAGNOSIS — I10 Essential (primary) hypertension: Secondary | ICD-10-CM | POA: Diagnosis present

## 2021-03-07 DIAGNOSIS — Z803 Family history of malignant neoplasm of breast: Secondary | ICD-10-CM | POA: Diagnosis not present

## 2021-03-07 DIAGNOSIS — E785 Hyperlipidemia, unspecified: Secondary | ICD-10-CM | POA: Diagnosis present

## 2021-03-07 DIAGNOSIS — Z20822 Contact with and (suspected) exposure to covid-19: Secondary | ICD-10-CM | POA: Diagnosis present

## 2021-03-07 DIAGNOSIS — J09X1 Influenza due to identified novel influenza A virus with pneumonia: Secondary | ICD-10-CM

## 2021-03-07 DIAGNOSIS — M109 Gout, unspecified: Secondary | ICD-10-CM | POA: Diagnosis present

## 2021-03-07 DIAGNOSIS — E669 Obesity, unspecified: Secondary | ICD-10-CM | POA: Diagnosis present

## 2021-03-07 DIAGNOSIS — D649 Anemia, unspecified: Secondary | ICD-10-CM | POA: Diagnosis present

## 2021-03-07 DIAGNOSIS — I130 Hypertensive heart and chronic kidney disease with heart failure and stage 1 through stage 4 chronic kidney disease, or unspecified chronic kidney disease: Secondary | ICD-10-CM | POA: Diagnosis present

## 2021-03-07 DIAGNOSIS — Z87891 Personal history of nicotine dependence: Secondary | ICD-10-CM

## 2021-03-07 DIAGNOSIS — J9620 Acute and chronic respiratory failure, unspecified whether with hypoxia or hypercapnia: Secondary | ICD-10-CM | POA: Diagnosis present

## 2021-03-07 DIAGNOSIS — J9621 Acute and chronic respiratory failure with hypoxia: Secondary | ICD-10-CM | POA: Diagnosis present

## 2021-03-07 DIAGNOSIS — L89159 Pressure ulcer of sacral region, unspecified stage: Secondary | ICD-10-CM | POA: Diagnosis present

## 2021-03-07 DIAGNOSIS — Z833 Family history of diabetes mellitus: Secondary | ICD-10-CM | POA: Diagnosis not present

## 2021-03-07 DIAGNOSIS — R531 Weakness: Secondary | ICD-10-CM | POA: Diagnosis present

## 2021-03-07 DIAGNOSIS — J8410 Pulmonary fibrosis, unspecified: Secondary | ICD-10-CM

## 2021-03-07 DIAGNOSIS — Z79899 Other long term (current) drug therapy: Secondary | ICD-10-CM

## 2021-03-07 DIAGNOSIS — Z96652 Presence of left artificial knee joint: Secondary | ICD-10-CM | POA: Diagnosis present

## 2021-03-07 DIAGNOSIS — E538 Deficiency of other specified B group vitamins: Secondary | ICD-10-CM | POA: Diagnosis present

## 2021-03-07 DIAGNOSIS — E1165 Type 2 diabetes mellitus with hyperglycemia: Secondary | ICD-10-CM

## 2021-03-07 DIAGNOSIS — E559 Vitamin D deficiency, unspecified: Secondary | ICD-10-CM | POA: Diagnosis present

## 2021-03-07 DIAGNOSIS — Z7984 Long term (current) use of oral hypoglycemic drugs: Secondary | ICD-10-CM

## 2021-03-07 DIAGNOSIS — E119 Type 2 diabetes mellitus without complications: Secondary | ICD-10-CM

## 2021-03-07 DIAGNOSIS — L8990 Pressure ulcer of unspecified site, unspecified stage: Secondary | ICD-10-CM | POA: Diagnosis present

## 2021-03-07 LAB — URINALYSIS, COMPLETE (UACMP) WITH MICROSCOPIC
Bacteria, UA: NONE SEEN
Bilirubin Urine: NEGATIVE
Glucose, UA: NEGATIVE mg/dL
Ketones, ur: NEGATIVE mg/dL
Leukocytes,Ua: NEGATIVE
Nitrite: NEGATIVE
Protein, ur: 100 mg/dL — AB
Specific Gravity, Urine: 1.02 (ref 1.005–1.030)
pH: 8.5 — ABNORMAL HIGH (ref 5.0–8.0)

## 2021-03-07 LAB — CBC WITH DIFFERENTIAL/PLATELET
Abs Immature Granulocytes: 0.03 10*3/uL (ref 0.00–0.07)
Basophils Absolute: 0 10*3/uL (ref 0.0–0.1)
Basophils Relative: 0 %
Eosinophils Absolute: 0.1 10*3/uL (ref 0.0–0.5)
Eosinophils Relative: 2 %
HCT: 35.2 % — ABNORMAL LOW (ref 36.0–46.0)
Hemoglobin: 11 g/dL — ABNORMAL LOW (ref 12.0–15.0)
Immature Granulocytes: 0 %
Lymphocytes Relative: 8 %
Lymphs Abs: 0.7 10*3/uL (ref 0.7–4.0)
MCH: 31.2 pg (ref 26.0–34.0)
MCHC: 31.3 g/dL (ref 30.0–36.0)
MCV: 99.7 fL (ref 80.0–100.0)
Monocytes Absolute: 0.4 10*3/uL (ref 0.1–1.0)
Monocytes Relative: 5 %
Neutro Abs: 6.9 10*3/uL (ref 1.7–7.7)
Neutrophils Relative %: 85 %
Platelets: 202 10*3/uL (ref 150–400)
RBC: 3.53 MIL/uL — ABNORMAL LOW (ref 3.87–5.11)
RDW: 17 % — ABNORMAL HIGH (ref 11.5–15.5)
WBC: 8.1 10*3/uL (ref 4.0–10.5)
nRBC: 0 % (ref 0.0–0.2)

## 2021-03-07 LAB — COMPREHENSIVE METABOLIC PANEL
ALT: 8 U/L (ref 0–44)
AST: 34 U/L (ref 15–41)
Albumin: 3.2 g/dL — ABNORMAL LOW (ref 3.5–5.0)
Alkaline Phosphatase: 48 U/L (ref 38–126)
Anion gap: 8 (ref 5–15)
BUN: 21 mg/dL (ref 8–23)
CO2: 27 mmol/L (ref 22–32)
Calcium: 9.5 mg/dL (ref 8.9–10.3)
Chloride: 101 mmol/L (ref 98–111)
Creatinine, Ser: 1.3 mg/dL — ABNORMAL HIGH (ref 0.44–1.00)
GFR, Estimated: 41 mL/min — ABNORMAL LOW (ref >60)
Glucose, Bld: 178 mg/dL — ABNORMAL HIGH (ref 70–99)
Potassium: 4 mmol/L (ref 3.5–5.1)
Sodium: 136 mmol/L (ref 135–145)
Total Bilirubin: 0.5 mg/dL (ref 0.3–1.2)
Total Protein: 7.1 g/dL (ref 6.5–8.1)

## 2021-03-07 LAB — BLOOD GAS, ARTERIAL
Acid-base deficit: 1.5 mmol/L (ref 0.0–2.0)
Bicarbonate: 22.7 mmol/L (ref 20.0–28.0)
FIO2: 0.36
O2 Saturation: 94.6 %
Patient temperature: 37
pCO2 arterial: 35 mmHg (ref 32.0–48.0)
pH, Arterial: 7.42 (ref 7.350–7.450)
pO2, Arterial: 72 mmHg — ABNORMAL LOW (ref 83.0–108.0)

## 2021-03-07 LAB — RESP PANEL BY RT-PCR (FLU A&B, COVID) ARPGX2
Influenza A by PCR: POSITIVE — AB
Influenza B by PCR: NEGATIVE
SARS Coronavirus 2 by RT PCR: NEGATIVE

## 2021-03-07 LAB — CBG MONITORING, ED: Glucose-Capillary: 191 mg/dL — ABNORMAL HIGH (ref 70–99)

## 2021-03-07 LAB — STREP PNEUMONIAE URINARY ANTIGEN: Strep Pneumo Urinary Antigen: NEGATIVE

## 2021-03-07 LAB — APTT: aPTT: 29 seconds (ref 24–36)

## 2021-03-07 LAB — PROCALCITONIN: Procalcitonin: 1.72 ng/mL

## 2021-03-07 LAB — LACTIC ACID, PLASMA
Lactic Acid, Venous: 3.1 mmol/L (ref 0.5–1.9)
Lactic Acid, Venous: 2.4 mmol/L (ref 0.5–1.9)
Lactic Acid, Venous: 2.1 mmol/L (ref 0.5–1.9)

## 2021-03-07 LAB — PROTIME-INR
INR: 1.1 (ref 0.8–1.2)
Prothrombin Time: 14.1 seconds (ref 11.4–15.2)

## 2021-03-07 LAB — MRSA NEXT GEN BY PCR, NASAL: MRSA by PCR Next Gen: NOT DETECTED

## 2021-03-07 MED ORDER — METHYLPREDNISOLONE SODIUM SUCC 125 MG IJ SOLR
125.0000 mg | Freq: Two times a day (BID) | INTRAMUSCULAR | Status: DC
  Administered 2021-03-07 – 2021-03-10 (×6): 125 mg via INTRAVENOUS
  Filled 2021-03-07 (×5): qty 2

## 2021-03-07 MED ORDER — ACETAMINOPHEN 325 MG PO TABS: 650.0000 mg | ORAL_TABLET | Freq: Four times a day (QID) | ORAL | Status: DC | PRN

## 2021-03-07 MED ORDER — SODIUM CHLORIDE 0.9 % IV SOLN
500.0000 mg | INTRAVENOUS | Status: DC
Start: 2021-03-07 — End: 2021-03-07
  Administered 2021-03-07: 12:00:00 500 mg via INTRAVENOUS
  Filled 2021-03-07: qty 500

## 2021-03-07 MED ORDER — BUDESONIDE 0.25 MG/2ML IN SUSP
0.2500 mg | Freq: Two times a day (BID) | RESPIRATORY_TRACT | Status: DC
Start: 2021-03-07 — End: 2021-03-10
  Administered 2021-03-07 – 2021-03-10 (×6): 0.25 mg via RESPIRATORY_TRACT
  Filled 2021-03-07 (×6): qty 2

## 2021-03-07 MED ORDER — ALBUTEROL SULFATE (2.5 MG/3ML) 0.083% IN NEBU
2.5000 mg | INHALATION_SOLUTION | RESPIRATORY_TRACT | Status: DC | PRN
  Filled 2021-03-07: qty 3

## 2021-03-07 MED ORDER — ASPIRIN EC 81 MG PO TBEC
81.0000 mg | DELAYED_RELEASE_TABLET | Freq: Every day | ORAL | Status: DC
  Administered 2021-03-08 – 2021-03-10 (×3): 81 mg via ORAL
  Filled 2021-03-07 (×3): qty 1

## 2021-03-07 MED ORDER — SODIUM CHLORIDE 0.9 % IV SOLN
2.0000 g | INTRAVENOUS | Status: DC
  Administered 2021-03-07: 12:00:00 2 g via INTRAVENOUS
  Filled 2021-03-07: qty 20

## 2021-03-07 MED ORDER — POTASSIUM CHLORIDE IN NACL 20-0.9 MEQ/L-% IV SOLN
INTRAVENOUS | Status: DC
  Filled 2021-03-07 (×6): qty 1000

## 2021-03-07 MED ORDER — SODIUM CHLORIDE 0.9 % IV BOLUS
1000.0000 mL | Freq: Once | INTRAVENOUS | Status: AC
Start: 2021-03-07 — End: 2021-03-07
  Administered 2021-03-07: 14:00:00 1000 mL via INTRAVENOUS

## 2021-03-07 MED ORDER — OSELTAMIVIR PHOSPHATE 30 MG PO CAPS
30.0000 mg | ORAL_CAPSULE | Freq: Two times a day (BID) | ORAL | Status: DC
  Administered 2021-03-08 – 2021-03-10 (×5): 30 mg via ORAL
  Filled 2021-03-07 (×7): qty 1

## 2021-03-07 MED ORDER — ACETAMINOPHEN 325 MG PO TABS
650.0000 mg | ORAL_TABLET | Freq: Four times a day (QID) | ORAL | Status: DC | PRN
  Administered 2021-03-07 – 2021-03-09 (×3): 650 mg via ORAL
  Filled 2021-03-07 (×2): qty 2

## 2021-03-07 MED ORDER — PANTOPRAZOLE SODIUM 40 MG PO TBEC
40.0000 mg | DELAYED_RELEASE_TABLET | Freq: Every day | ORAL | Status: DC
  Administered 2021-03-08 – 2021-03-10 (×3): 40 mg via ORAL
  Filled 2021-03-07 (×3): qty 1

## 2021-03-07 MED ORDER — INSULIN ASPART 100 UNIT/ML IJ SOLN
0.0000 [IU] | Freq: Every day | INTRAMUSCULAR | Status: DC
  Administered 2021-03-07 – 2021-03-09 (×2): 2 [IU] via SUBCUTANEOUS
  Filled 2021-03-07 (×2): qty 1

## 2021-03-07 MED ORDER — SODIUM CHLORIDE 0.9 % IV BOLUS (SEPSIS)
1000.0000 mL | Freq: Once | INTRAVENOUS | Status: AC
  Administered 2021-03-07: 12:00:00 1000 mL via INTRAVENOUS

## 2021-03-07 MED ORDER — ALLOPURINOL 100 MG PO TABS
300.0000 mg | ORAL_TABLET | Freq: Every day | ORAL | Status: DC
  Administered 2021-03-08 – 2021-03-10 (×3): 300 mg via ORAL
  Filled 2021-03-07 (×2): qty 1
  Filled 2021-03-07 (×2): qty 3

## 2021-03-07 MED ORDER — INSULIN ASPART 100 UNIT/ML IJ SOLN
0.0000 [IU] | Freq: Three times a day (TID) | INTRAMUSCULAR | Status: DC
  Administered 2021-03-07: 17:00:00 3 [IU] via SUBCUTANEOUS
  Administered 2021-03-08: 5 [IU] via SUBCUTANEOUS
  Administered 2021-03-08: 8 [IU] via SUBCUTANEOUS
  Administered 2021-03-08 – 2021-03-09 (×3): 3 [IU] via SUBCUTANEOUS
  Administered 2021-03-09: 2 [IU] via SUBCUTANEOUS
  Filled 2021-03-07 (×7): qty 1

## 2021-03-07 MED ORDER — ENOXAPARIN SODIUM 40 MG/0.4ML IJ SOSY
40.0000 mg | PREFILLED_SYRINGE | INTRAMUSCULAR | Status: DC
  Administered 2021-03-07 – 2021-03-09 (×3): 40 mg via SUBCUTANEOUS
  Filled 2021-03-07 (×2): qty 0.4

## 2021-03-07 MED ORDER — ALBUTEROL SULFATE (2.5 MG/3ML) 0.083% IN NEBU
2.5000 mg | INHALATION_SOLUTION | RESPIRATORY_TRACT | Status: DC
  Administered 2021-03-07 – 2021-03-10 (×19): 2.5 mg via RESPIRATORY_TRACT
  Filled 2021-03-07 (×19): qty 3

## 2021-03-07 MED ORDER — SODIUM CHLORIDE 0.9 % IV SOLN
2.0000 g | Freq: Two times a day (BID) | INTRAVENOUS | Status: DC
  Administered 2021-03-07 – 2021-03-08 (×2): 2 g via INTRAVENOUS
  Filled 2021-03-07 (×5): qty 2

## 2021-03-07 MED ORDER — OSELTAMIVIR PHOSPHATE 75 MG PO CAPS
75.0000 mg | ORAL_CAPSULE | Freq: Once | ORAL | Status: AC
  Administered 2021-03-07: 14:00:00 75 mg via ORAL
  Filled 2021-03-07: qty 1

## 2021-03-07 MED ORDER — GABAPENTIN 100 MG PO CAPS
100.0000 mg | ORAL_CAPSULE | Freq: Three times a day (TID) | ORAL | Status: DC
Start: 2021-03-07 — End: 2021-03-10
  Administered 2021-03-07 – 2021-03-10 (×10): 100 mg via ORAL
  Filled 2021-03-07 (×10): qty 1

## 2021-03-07 MED ORDER — ROSUVASTATIN CALCIUM 10 MG PO TABS
40.0000 mg | ORAL_TABLET | Freq: Every day | ORAL | Status: DC
  Administered 2021-03-08 – 2021-03-10 (×3): 40 mg via ORAL
  Filled 2021-03-07: qty 2
  Filled 2021-03-07 (×4): qty 4

## 2021-03-07 NOTE — ED Notes (Signed)
Date and time results received: 03/07/21 1256 (use smartphrase ".now" to insert current time)  Test: Lactate Critical Value: 3.1  Name of Provider Notified: Scotty Court

## 2021-03-07 NOTE — ED Provider Notes (Signed)
[facility] Emergency Department Provider Note  ____________________________________________  Time seen: Approximately 1:07 PM  I have reviewed the triage vital signs and the nursing notes.   HISTORY  Chief Complaint Fever and Weakness    HPI Sierra Guzman is a 80 y.o. female with a history of heart failure, CKD, diabetes, hypertension, pulmonary fibrosis who comes to the ED complaining of fatigue and malaise for the past 2 days.  EMS found her oxygen saturation to be about 80% on her usual 3 L nasal cannula of home oxygen, so they increased to 6 L.  Patient also febrile and tachycardic.  Symptoms are constant, gradual onset, worsening.  Patient's granddaughter reportedly is sick with influenza currently.  Patient denies chest pain or other acute pain complaints.  No vomiting or diarrhea.    Past Medical History:  Diagnosis Date   Anemia 10/28/2010   unspecified   Anxiety    Aortic valve sclerosis    Arthritis    Asthma    Chronic diastolic CHF (congestive heart failure) ([place])    Echo 12/2020 = nl EF, Grade 1 DD, normal RV function, no mention of pulm art pressure though   Chronic kidney disease    Chronic Kidney Disease---Stage III   Coronary artery disease 10/28/2010   DDD (degenerative disc disease), lumbar 2006   Diabetes type 2, controlled ([place]) 10/28/2010   Dyspnea    GERD (gastroesophageal reflux disease)    Gout 10/28/2010   Heart murmur    History of cardiac catheterization 2006   LAD 50%, D1 50% rest normal.    History of cardiovascular stress test 09/23/2009    lexiscan Normal Cardiolite test, EF 61%. LV size and function - normal   History of echocardiogram 05/27/2008   EF 55%; Mild aortic insufficiency with minimal aortic sclerosis. L vent normal.    Hyperlipidemia    Hyperlipidemia LDL goal <100 10/28/2010   managed by Dr. Dorena Bodo   Hypertension 10/28/2010   IPF (idiopathic pulmonary fibrosis) ([place])    Progressive, with UIP    Mild vitamin D deficiency 02/22/2011   Obesity (BMI 30-39.9) 10/28/2010   unspecified   Osteoarthritis 10/28/2010   bilateral knees, left hip   Personal history of colonic polyps 10/28/2010   Shortness of breath    Syncope 2013   Vitamin B 12 deficiency 10/28/2010     Patient Active Problem List   Diagnosis Date Noted   Pressure injury of skin 01/13/2021   CKD (chronic kidney disease) stage 3, GFR 30-59 ml/min ([place]) 01/01/2021   ARF (acute renal failure) ([place]) 12/28/2020   Chronic diastolic CHF (congestive heart failure) ([place]) 12/27/2020   AKI (acute kidney injury) ([place]) 12/27/2020   IPF (idiopathic pulmonary fibrosis) ([place]) 12/27/2020   Sacral decubitus ulcer 12/27/2020   Occult blood positive stool 12/27/2020   DDD (degenerative disc disease), cervical 12/21/2020   Hereditary and idiopathic peripheral neuropathy 12/21/2020   Localized, primary osteoarthritis 12/21/2020   Low back pain 12/21/2020   Pain in limb 12/21/2020   Chronic respiratory failure with hypoxia ([place]) 12/05/2020   Acute respiratory failure with hypoxia ([place]) 12/05/2020   Hip pain 08/06/2020   Spinal stenosis in cervical region 08/06/2020   Spondylolisthesis, congenital 08/06/2020   Status post total hip replacement, 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   Primary localized osteoarthritis of right hip 12/12/2016   B12 deficiency 10/28/2010   Coronary artery disease 10/28/2010   Diabetes type 2, controlled ([place]) 10/28/2010   Gout 10/28/2010   Hyperlipidemia with target LDL less  than 100 10/28/2010   Hypertension 10/28/2010   Osteoarthritis of left knee 10/28/2010   Anemia 10/28/2010   History of colonic polyps 10/28/2010     Past Surgical History:  Procedure Laterality Date   BACK SURGERY  2006   DDD   BREAST BIOPSY Right    neg   CARDIAC CATHETERIZATION  2006   EYE SURGERY Bilateral    Cataract Extraction with IOL   JOINT REPLACEMENT Left    Left Total Hip Replacement   JOINT REPLACEMENT     RIGHT 2005; LEFT 2  X 2004, 2011   REPLACEMENT TOTAL KNEE Left    REVISION TOTAL KNEE ARTHROPLASTY Left    Dr. Valencia Right 12/12/2016   Procedure: TOTAL HIP ARTHROPLASTY ANTERIOR APPROACH;  Surgeon: Hessie Knows, MD;  Location: [facility] ORS;  Service: Orthopedics;  Laterality: Right;   TOTAL HIP ARTHROPLASTY Left 12/26/2013   Procedure: ARTHROPLASTY HIP TOTAL; Surgeon: Zorita Pang, MD; Location: [place]; Service: Orthopedics; Laterality: left,    TOTAL KNEE ARTHROPLASTY     both knees     Prior to Admission medications   Medication Sig Start Date End Date Taking? Authorizing Provider  acetaminophen (TYLENOL) 325 MG tablet Take 2 tablets (650 mg total) by mouth every 6 (six) hours as needed (pain). 01/17/21   Arrien, Jimmy Picket, MD  albuterol (VENTOLIN HFA) 108 (90 Base) MCG/ACT inhaler Inhale 1-2 puffs into the lungs See admin instructions. Give 1 puff inhaled via mask four times a day and also may give 2 puffs every 6 hours as needed for wheezing or shortness of breath    [provider]  allopurinol (ZYLOPRIM) 300 MG tablet Take 300 mg by mouth daily.     [provider]  AMINO ACIDS-PROTEIN HYDROLYS PO Take 30 mLs by mouth See admin instructions. Give 40mL by mouth twice a day for wound healing until 02/07/21 23:59    [provider]  aspirin EC 81 MG EC tablet Take 1 tablet (81 mg total) by mouth daily. Swallow whole. 01/18/21   Arrien, Jimmy Picket, MD  benzonatate (TESSALON) 100 MG capsule Take 100 mg by mouth 3 (three) times daily as needed for cough.    [provider]  Blood Glucose Monitoring Suppl (GLUCOCOM BLOOD GLUCOSE MONITOR) [place] See admin instructions. 04/23/19   [provider]  cholecalciferol (VITAMIN D) 1000 units tablet Take 1,000 Units by mouth daily.    [provider]  Cinnamon 500 MG capsule Take 500 mg by mouth daily.    [provider]  docusate sodium (COLACE) 100  MG capsule Take 1 capsule (100 mg total) by mouth 2 (two) times daily. 12/14/16   Duanne Guess, PA-C  Fluticasone-Salmeterol (ADVAIR) 500-50 MCG/DOSE AEPB Inhale 1 puff into the lungs every 12 (twelve) hours.    [provider]  furosemide (LASIX) 20 MG tablet Take 1 tablet (20 mg total) by mouth daily as needed for edema. 01/03/21   Arrien, Jimmy Picket, MD  gabapentin (NEURONTIN) 300 MG capsule Take 300 mg by mouth 3 (three) times daily. Given at 0800, 1400, 2000 03/02/18   [provider]  guaiFENesin-dextromethorphan (ROBITUSSIN DM) 100-10 MG/5ML syrup Take 5 mLs by mouth every 6 (six) hours as needed for cough. 01/17/21   Arrien, Jimmy Picket, MD  INCRUSE ELLIPTA 62.5 MCG/INH AEPB Inhale 1 puff into the lungs daily. 12/01/20   [provider]  liver oil-zinc oxide (DESITIN) 40 % ointment  Apply to buttocks topically three times a day for wound prevention    [provider]  loratadine (CLARITIN) 10 MG tablet Take 10 mg by mouth daily.    [provider]  magnesium oxide (MAG-OX) 400 MG tablet Take 1 tablet by mouth daily. 07/21/20   [provider]  metFORMIN (GLUCOPHAGE) 850 MG tablet Take 1 tablet (850 mg total) by mouth 2 (two) times daily with a meal. 01/19/21 01/19/22  Max Sane, MD  metoprolol succinate (TOPROL-XL) 25 MG 24 hr tablet Take 0.5 tablets (12.5 mg total) by mouth daily. 01/18/21 02/17/21  Arrien, Jimmy Picket, MD  Multiple Vitamin (MULTI-VITAMIN) tablet Take 1 tablet by mouth daily.    [provider]  Nystatin (GERHARDT'S BUTT CREAM) CREA Apply 1 application topically 3 (three) times daily. 01/03/21   Arrien, Jimmy Picket, MD  Omega-3 Fatty Acids (FISH OIL) 1000 MG CAPS Take 1,000 mg by mouth daily.    [provider]  omeprazole (PRILOSEC) 20 MG capsule Take 20 mg by mouth daily. 11/05/20   [provider]  polyethylene glycol (MIRALAX / GLYCOLAX) 17 g packet Take 17 g by mouth daily.  12/19/20   Shelly Coss, MD  predniSONE (DELTASONE) 20 MG tablet Take daily for 2 days, then take half tablet daily for 4 days, than stop. 01/17/21   Arrien, Jimmy Picket, MD  rosuvastatin (CRESTOR) 40 MG tablet Take 40 mg by mouth daily. 09/17/20   [provider]  vitamin B-12 (CYANOCOBALAMIN) 1000 MCG tablet Take 1,000 mcg by mouth daily.    [provider]  Vitamin D, Ergocalciferol, (DRISDOL) 1.25 MG (50000 UNIT) CAPS capsule Take 1 capsule (50,000 Units total) by mouth every 7 (seven) days. Patient taking differently: Take 50,000 Units by mouth every 7 (seven) days. On Wednesday 12/22/20   Shelly Coss, MD  enoxaparin (LOVENOX) 40 MG/0.4ML injection Inject 0.4 mLs (40 mg total) into the skin daily. 12/15/16 03/25/19  Duanne Guess, PA-C  pregabalin (LYRICA) 50 MG capsule Take 1 capsule (50 mg total) by mouth 3 (three) times daily. 12/18/16 03/25/19  Toni Arthurs, NP  ranitidine (ZANTAC) 300 MG capsule Take 300 mg by mouth every evening.  03/25/19  [provider]  simvastatin (ZOCOR) 40 MG tablet Take 40 mg by mouth daily at 6 PM.  03/25/19  [provider]     Allergies Morphine and related   Family History  Problem Relation Age of Onset   Breast cancer Mother 4   Diabetes Mother    Stroke Mother    Stroke Brother    Breast cancer Cousin        pat cousin    Social History Social History   Tobacco Use   Smoking status: Former    Packs/day: 0.25    Years: 10.00    Pack years: 2.50    Types: Cigarettes    Quit date: 08/18/1990    Years since quitting: 30.5   Smokeless tobacco: Never  Vaping Use   Vaping Use: Never used  Substance Use Topics   Alcohol use: No   Drug use: No    Review of Systems  Constitutional:   Positive fever and chills.  ENT:   No sore throat. No rhinorrhea. Cardiovascular:   No chest pain or syncope. Respiratory:   Positive shortness of breath and nonproductive cough. Gastrointestinal:   Negative  for abdominal pain, vomiting and diarrhea.  Musculoskeletal:   Negative for focal pain or swelling All other systems reviewed and are negative  except as documented above in ROS and HPI.  ____________________________________________   PHYSICAL EXAM:  VITAL SIGNS: ED Triage Vitals  Enc Vitals Group     BP 03/07/21 1130 123/71     Pulse Rate 03/07/21 1130 (!) 108     Resp 03/07/21 1130 (!) 27     Temp 03/07/21 1136 (!) 101.9 F (38.8 C)     Temp Source 03/07/21 1136 Oral     SpO2 03/07/21 1130 100 %     Weight 03/07/21 1137 150 lb (68 kg)     Height 03/07/21 1137 5\' 7"  (1.702 m)     Head Circumference --      Peak Flow --      Pain Score 03/07/21 1137 0     Pain Loc --      Pain Edu? --      Excl. in GC? --     Vital signs reviewed, nursing assessments reviewed.   Constitutional:   Alert and oriented.  Ill-appearing. Eyes:   Conjunctivae are normal. EOMI. PERRL. ENT      Head:   Normocephalic and atraumatic.      Nose:   Normal      Mouth/Throat:   Dry mucous membranes      Neck:   No meningismus. Full ROM. Hematological/Lymphatic/Immunilogical:   No cervical lymphadenopathy. Cardiovascular:   Irregularly irregular rhythm, tachycardia with a heart rate of 115. Symmetric bilateral radial and DP pulses.  No murmurs. Cap refill less than 2 seconds. Respiratory:   Bilateral basilar crackles.  No wheezing.  Symmetric air movement. Gastrointestinal:   Soft and nontender. Non distended. There is no CVA tenderness.  No rebound, rigidity, or guarding. Genitourinary:   deferred Musculoskeletal:   Normal range of motion in all extremities. No joint effusions.  No lower extremity tenderness.  No edema. Neurologic:   Normal speech and language.  Motor grossly intact. No acute focal neurologic deficits are appreciated.  Skin:    Skin is warm, dry and intact. No rash noted.  No petechiae, purpura, or bullae.  ____________________________________________    LABS (pertinent  positives/negatives) (all labs ordered are listed, but only abnormal results are displayed) Labs Reviewed  RESP PANEL BY RT-PCR (FLU A&B, COVID) ARPGX2 - Abnormal; Notable for the following components:      Result Value   Influenza A by PCR POSITIVE (*)    All other components within normal limits  LACTIC ACID, PLASMA - Abnormal; Notable for the following components:   Lactic Acid, Venous 3.1 (*)    All other components within normal limits  COMPREHENSIVE METABOLIC PANEL - Abnormal; Notable for the following components:   Glucose, Bld 178 (*)    Creatinine, Ser 1.30 (*)    Albumin 3.2 (*)    GFR, Estimated 41 (*)    All other components within normal limits  CBC WITH DIFFERENTIAL/PLATELET - Abnormal; Notable for the following components:   RBC 3.53 (*)    Hemoglobin 11.0 (*)    HCT 35.2 (*)    RDW 17.0 (*)    All other components within normal limits  CULTURE, BLOOD (ROUTINE X 2)  CULTURE, BLOOD (ROUTINE X 2)  URINE CULTURE  PROTIME-INR  APTT  PROCALCITONIN  LACTIC ACID, PLASMA  URINALYSIS, COMPLETE (UACMP) WITH MICROSCOPIC   ____________________________________________   EKG  Interpreted by me Atrial fibrillation rate 115.  Normal axis.  Normal intervals.  Left ventricular hypertrophy.  Normal ST segments.  There is an isolated T wave inversion in lead  III which is nonspecific., no ischemic changes.  ____________________________________________    RADIOLOGY  DG Chest Port 1 View  Result Date: 03/07/2021 CLINICAL DATA:  Or shortness of breath.  Question sepsis. EXAM: PORTABLE CHEST 1 VIEW COMPARISON:  01/11/2021.  12/27/2020. FINDINGS: Heart size is unchanged. Aortic tortuosity as seen previously. Chronic interstitial lung disease as seen previously. Pulmonary opacity appears slightly more pronounced in there could be an element of superimposed edema or pneumonia. No dense consolidation or collapse. IMPRESSION: Background pattern of chronic interstitial lung disease.  Pulmonary density appears slightly more pronounced, indicating that there could be an element of superimposed acute edema or infiltrate. No dense consolidation. Electronically Signed   By: Nelson Chimes M.D.   On: 03/07/2021 11:48    ____________________________________________   PROCEDURES .Critical Care Performed by: Carrie Mew, MD Authorized by: Carrie Mew, MD   Critical care provider statement:    Critical care time (minutes):  35   Critical care time was exclusive of:  Separately billable procedures and treating other patients   Critical care was necessary to treat or prevent imminent or life-threatening deterioration of the following conditions:  Sepsis and respiratory failure   Critical care was time spent personally by me on the following activities:  Development of treatment plan with patient or surrogate, discussions with consultants, evaluation of patient's response to treatment, examination of patient, obtaining history from patient or surrogate, ordering and performing treatments and interventions, ordering and review of laboratory studies, ordering and review of radiographic studies, pulse oximetry, re-evaluation of patient's condition and review of old charts  ____________________________________________  DIFFERENTIAL DIAGNOSIS   Pneumonia, viral illness, chronic lung disease exacerbation, sepsis, dehydration, electrolyte abnormality  CLINICAL IMPRESSION / ASSESSMENT AND PLAN / ED COURSE  Medications ordered in the ED: Medications  cefTRIAXone (ROCEPHIN) 2 g in sodium chloride 0.9 % 100 mL IVPB (0 g Intravenous Stopped 03/07/21 1208)  azithromycin (ZITHROMAX) 500 mg in sodium chloride 0.9 % 250 mL IVPB (0 mg Intravenous Stopped 03/07/21 1251)  oseltamivir (TAMIFLU) capsule 75 mg (has no administration in time range)  sodium chloride 0.9 % bolus 1,000 mL (1,000 mLs Intravenous New Bag/Given 03/07/21 1130)    Pertinent labs & imaging results that were  available during my care of the patient were reviewed by me and considered in my medical decision making (see chart for details).  ALVIN LEAN was evaluated in Emergency Department on 03/07/2021 for the symptoms described in the history of present illness. She was evaluated in the context of the global COVID-19 pandemic, which necessitated consideration that the patient might be at risk for infection with the SARS-CoV-2 virus that causes COVID-19. Institutional protocols and algorithms that pertain to the evaluation of patients at risk for COVID-19 are in a state of rapid change based on information released by regulatory bodies including the CDC and federal and state organizations. These policies and algorithms were followed during the patient's care in the ED.   Patient presents with fever tachycardia tachypnea and increased oxygen requirement, now needing 6 L oxygen compared to her usual 3 L.  Most suspicious for pneumonia versus influenza.  Clinical Course as of 03/07/21 1307  Mon Mar 07, 2021  1259 Influenza positive.  Chest x-ray shows some diffuse infiltrates, no consolidation.  Work-up reveals her abnormal vital signs to be due to dehydration and SIRS from influenza.  No bacterial infection identified.  Patient is not septic.  Continue IV fluids for hydration.  Due to her age and presentation we will  start Tamiflu.  Will admit for further management. [PS]    Clinical Course User Index [PS] Carrie Mew, MD     ____________________________________________   FINAL CLINICAL IMPRESSION(S) / ED DIAGNOSES    Final diagnoses:  Acute on chronic respiratory failure with hypoxia ([place])  Influenza A  Pulmonary fibrosis ([place])  Type 2 diabetes mellitus without complication, without long-term current use of insulin [facility])     ED Discharge Orders     None       Portions of this note were generated with dragon dictation software. Dictation errors may occur despite best attempts at  proofreading.    Carrie Mew, MD 03/07/21 1312

## 2021-03-07 NOTE — ED Notes (Signed)
Decreased O2, pt on 4L via [place], satting 100%

## 2021-03-07 NOTE — ED Triage Notes (Signed)
Pt to er room number 26, ems reports weakness for the past few days and decreased O2 sat, states that she had a temp of 100.9, states that she was in a fib and they gave 5mg  of iv metop, states that she is now controled a fib 90-120, placed 22 gauge iv in R hand.

## 2021-03-07 NOTE — H&P (Signed)
History and Physical    Sierra Guzman [id number] DOB: 12/11/1940 DOA: 03/07/2021  PCP: Wardell Honour, MD  Patient coming from: home   Chief Complaint: cough, shortness of breath  HPI: Sierra Guzman is a 80 y.o. female with medical history significant for idiopathic pulmonary fibrosis, chronic hypoxic respiratory failure on 2 L at baseline, non-obstructive CAD, HTN, DM, gout, who presents with the above.  Admitted last month where treated for CAP and exacerbation of her ILD.  Reports exposure to grandchild with influenza. 2 days ago patient developed non-productive cough, body aches, and progressive fatigue. No n/v/d, is tolerating by mouth. No chest pain. Does endorse fevers. No dysuria of increased frequency. Biggest complaint is feeling very weak. Lives with her daughter. Followed by pulm and cardiology as an outpatient.  ED Course:   Code sepsis. 1 L fluids, ceftriaxone/azithromycin given, flu a positive, tamiflu ordered. Labs and cxr ordered.  Review of Systems: As per HPI otherwise 10 point review of systems negative.    Past Medical History:  Diagnosis Date   Anemia 10/28/2010   unspecified   Anxiety    Aortic valve sclerosis    Arthritis    Asthma    Chronic diastolic CHF (congestive heart failure) ([place])    Echo 12/2020 = nl EF, Grade 1 DD, normal RV function, no mention of pulm art pressure though   Chronic kidney disease    Chronic Kidney Disease---Stage III   Coronary artery disease 10/28/2010   DDD (degenerative disc disease), lumbar 2006   Diabetes type 2, controlled ([place]) 10/28/2010   Dyspnea    GERD (gastroesophageal reflux disease)    Gout 10/28/2010   Heart murmur    History of cardiac catheterization 2006   LAD 50%, D1 50% rest normal.    History of cardiovascular stress test 09/23/2009    lexiscan Normal Cardiolite test, EF 61%. LV size and function - normal   History of echocardiogram 05/27/2008   EF 55%; Mild aortic insufficiency with minimal  aortic sclerosis. L vent normal.    Hyperlipidemia    Hyperlipidemia LDL goal <100 10/28/2010   managed by Dr. Dorena Bodo   Hypertension 10/28/2010   IPF (idiopathic pulmonary fibrosis) ([place])    Progressive, with UIP   Mild vitamin D deficiency 02/22/2011   Obesity (BMI 30-39.9) 10/28/2010   unspecified   Osteoarthritis 10/28/2010   bilateral knees, left hip   Personal history of colonic polyps 10/28/2010   Shortness of breath    Syncope 2013   Vitamin B 12 deficiency 10/28/2010    Past Surgical History:  Procedure Laterality Date   BACK SURGERY  2006   DDD   BREAST BIOPSY Right    neg   CARDIAC CATHETERIZATION  2006   EYE SURGERY Bilateral    Cataract Extraction with IOL   JOINT REPLACEMENT Left    Left Total Hip Replacement   JOINT REPLACEMENT     RIGHT 2005; LEFT 2 X 2004, 2011   REPLACEMENT TOTAL KNEE Left    REVISION TOTAL KNEE ARTHROPLASTY Left    Dr. Welcome Right 12/12/2016   Procedure: TOTAL HIP ARTHROPLASTY ANTERIOR APPROACH;  Surgeon: Hessie Knows, MD;  Location: [facility] ORS;  Service: Orthopedics;  Laterality: Right;   TOTAL HIP ARTHROPLASTY Left 12/26/2013   Procedure: ARTHROPLASTY HIP TOTAL; Surgeon: Zorita Pang, MD; Location: [place]; Service: Orthopedics; Laterality: left,    TOTAL KNEE ARTHROPLASTY     both knees  reports that she quit smoking about 30 years ago. Her smoking use included cigarettes. She has a 2.50 pack-year smoking history. She has never used smokeless tobacco. She reports that she does not drink alcohol and does not use drugs.  Allergies  Allergen Reactions   Morphine And Related Nausea And Vomiting    Family History  Problem Relation Age of Onset   Breast cancer Mother 70   Diabetes Mother    Stroke Mother    Stroke Brother    Breast cancer Cousin        pat cousin    Prior to Admission medications   Medication Sig Start Date End Date Taking? Authorizing Provider   acetaminophen (TYLENOL) 325 MG tablet Take 2 tablets (650 mg total) by mouth every 6 (six) hours as needed (pain). 01/17/21   Arrien, Jimmy Picket, MD  albuterol (VENTOLIN HFA) 108 (90 Base) MCG/ACT inhaler Inhale 1-2 puffs into the lungs See admin instructions. Give 1 puff inhaled via mask four times a day and also may give 2 puffs every 6 hours as needed for wheezing or shortness of breath    [provider]  allopurinol (ZYLOPRIM) 300 MG tablet Take 300 mg by mouth daily.     [provider]  AMINO ACIDS-PROTEIN HYDROLYS PO Take 30 mLs by mouth See admin instructions. Give 39mL by mouth twice a day for wound healing until 02/07/21 23:59    [provider]  aspirin EC 81 MG EC tablet Take 1 tablet (81 mg total) by mouth daily. Swallow whole. 01/18/21   Arrien, Jimmy Picket, MD  benzonatate (TESSALON) 100 MG capsule Take 100 mg by mouth 3 (three) times daily as needed for cough.    [provider]  Blood Glucose Monitoring Suppl (GLUCOCOM BLOOD GLUCOSE MONITOR) [place] See admin instructions. 04/23/19   [provider]  cholecalciferol (VITAMIN D) 1000 units tablet Take 1,000 Units by mouth daily.    [provider]  Cinnamon 500 MG capsule Take 500 mg by mouth daily.    [provider]  docusate sodium (COLACE) 100 MG capsule Take 1 capsule (100 mg total) by mouth 2 (two) times daily. 12/14/16   Duanne Guess, PA-C  Fluticasone-Salmeterol (ADVAIR) 500-50 MCG/DOSE AEPB Inhale 1 puff into the lungs every 12 (twelve) hours.    [provider]  furosemide (LASIX) 20 MG tablet Take 1 tablet (20 mg total) by mouth daily as needed for edema. 01/03/21   Arrien, Jimmy Picket, MD  gabapentin (NEURONTIN) 300 MG capsule Take 300 mg by mouth 3 (three) times daily. Given at 0800, 1400, 2000 03/02/18   [provider]  guaiFENesin-dextromethorphan (ROBITUSSIN DM) 100-10 MG/5ML syrup Take 5 mLs by mouth every 6 (six) hours as  needed for cough. 01/17/21   Arrien, Jimmy Picket, MD  INCRUSE ELLIPTA 62.5 MCG/INH AEPB Inhale 1 puff into the lungs daily. 12/01/20   [provider]  liver oil-zinc oxide (DESITIN) 40 % ointment Apply to buttocks topically three times a day for wound prevention    [provider]  loratadine (CLARITIN) 10 MG tablet Take 10 mg by mouth daily.    [provider]  magnesium oxide (MAG-OX) 400 MG tablet Take 1 tablet by mouth daily. 07/21/20   [provider]  metFORMIN (GLUCOPHAGE) 850 MG tablet Take 1 tablet (850 mg total) by mouth 2 (two) times daily with a meal. 01/19/21 01/19/22  Max Sane, MD  metoprolol succinate (TOPROL-XL) 25 MG 24 hr tablet Take 0.5  tablets (12.5 mg total) by mouth daily. 01/18/21 02/17/21  Arrien, [place] Ram, MD  Multiple Vitamin (MULTI-VITAMIN) tablet Take 1 tablet by mouth daily.    [provider]  Nystatin (GERHARDT'S BUTT CREAM) CREA Apply 1 application topically 3 (three) times daily. 01/03/21   Arrien, [place] Ram, MD  Omega-3 Fatty Acids (FISH OIL) 1000 MG CAPS Take 1,000 mg by mouth daily.    [provider]  omeprazole (PRILOSEC) 20 MG capsule Take 20 mg by mouth daily. 11/05/20   [provider]  polyethylene glycol (MIRALAX / GLYCOLAX) 17 g packet Take 17 g by mouth daily. 12/19/20   Burnadette Pop, MD  predniSONE (DELTASONE) 20 MG tablet Take daily for 2 days, then take half tablet daily for 4 days, than stop. 01/17/21   Arrien, [place] Ram, MD  rosuvastatin (CRESTOR) 40 MG tablet Take 40 mg by mouth daily. 09/17/20   [provider]  vitamin B-12 (CYANOCOBALAMIN) 1000 MCG tablet Take 1,000 mcg by mouth daily.    [provider]  Vitamin D, Ergocalciferol, (DRISDOL) 1.25 MG (50000 UNIT) CAPS capsule Take 1 capsule (50,000 Units total) by mouth every 7 (seven) days. Patient taking differently: Take 50,000 Units by mouth every 7 (seven) days. On Wednesday 12/22/20    Burnadette Pop, MD  enoxaparin (LOVENOX) 40 MG/0.4ML injection Inject 0.4 mLs (40 mg total) into the skin daily. 12/15/16 03/25/19  Evon Slack, PA-C  pregabalin (LYRICA) 50 MG capsule Take 1 capsule (50 mg total) by mouth 3 (three) times daily. 12/18/16 03/25/19  Lorenso Quarry, NP  ranitidine (ZANTAC) 300 MG capsule Take 300 mg by mouth every evening.  03/25/19  [provider]  simvastatin (ZOCOR) 40 MG tablet Take 40 mg by mouth daily at 6 PM.  03/25/19  [provider]    Physical Exam: Vitals:   03/07/21 1137 03/07/21 1200 03/07/21 1215 03/07/21 1345  BP:  (!) 109/46 99/82 110/63  Pulse:  (!) 107 (!) 102 (!) 114  Resp:  (!) 29 (!) 39 (!) 32  Temp:    (!) 102 F (38.9 C)  TempSrc:    Oral  SpO2:  100% 100% 100%  Weight: 68 kg     Height: 5\' 7"  (1.702 m)       Constitutional: No acute distress, ill appearing Head: Atraumatic Eyes: Conjunctiva clear ENM: Moist mucous membranes. Normal dentition.  Neck: Supple Respiratory: diffuse wheeze/rhonchi. tachypnic Cardiovascular: Regular rate and rhythm. Soft systolic murmur Abdomen: Non-tender, non-distended. No masses. No rebound or guarding. Positive bowel sounds. Musculoskeletal: No joint deformity upper and lower extremities. Normal ROM, no contractures. Normal muscle tone.  Skin: subcentimeter stage 2 sacraldecubitus ulcer no sig surrounding erythema Extremities: No peripheral edema. Palpable peripheral pulses. Neurologic: Alert, moving all 4 extremities. Psychiatric: Normal insight and judgement.   Labs on Admission: I have personally reviewed following labs and imaging studies  CBC: Recent Labs  Lab 03/07/21 1124  WBC 8.1  NEUTROABS 6.9  HGB 11.0*  HCT 35.2*  MCV 99.7  PLT 202   Basic Metabolic Panel: Recent Labs  Lab 03/07/21 1124  NA 136  K 4.0  CL 101  CO2 27  GLUCOSE 178*  BUN 21  CREATININE 1.30*  CALCIUM 9.5   GFR: Estimated Creatinine Clearance: 33 mL/min (A) (by C-G formula  based on SCr of 1.3 mg/dL (H)). Liver Function Tests: Recent Labs  Lab 03/07/21 1124  AST 34  ALT 8  ALKPHOS 48  BILITOT 0.5  PROT 7.1  ALBUMIN 3.2*  No results for input(s): LIPASE, AMYLASE in the last 168 hours. No results for input(s): AMMONIA in the last 168 hours. Coagulation Profile: Recent Labs  Lab 03/07/21 1124  INR 1.1   Cardiac Enzymes: No results for input(s): CKTOTAL, CKMB, CKMBINDEX, TROPONINI in the last 168 hours. BNP (last 3 results) No results for input(s): PROBNP in the last 8760 hours. HbA1C: No results for input(s): HGBA1C in the last 72 hours. CBG: No results for input(s): GLUCAP in the last 168 hours. Lipid Profile: No results for input(s): CHOL, HDL, LDLCALC, TRIG, CHOLHDL, LDLDIRECT in the last 72 hours. Thyroid Function Tests: No results for input(s): TSH, T4TOTAL, FREET4, T3FREE, THYROIDAB in the last 72 hours. Anemia Panel: No results for input(s): VITAMINB12, FOLATE, FERRITIN, TIBC, IRON, RETICCTPCT in the last 72 hours. Urine analysis:    Component Value Date/Time   COLORURINE YELLOW 12/27/2020 2005   APPEARANCEUR CLEAR 12/27/2020 2005   LABSPEC 1.010 12/27/2020 2005   PHURINE 6.0 12/27/2020 2005   GLUCOSEU NEGATIVE 12/27/2020 2005   HGBUR NEGATIVE 12/27/2020 2005   BILIRUBINUR NEGATIVE 12/27/2020 2005   [place] 12/27/2020 2005   PROTEINUR NEGATIVE 12/27/2020 2005   NITRITE NEGATIVE 12/27/2020 2005   LEUKOCYTESUR MODERATE (A) 12/27/2020 2005    Radiological Exams on Admission: DG Chest Port 1 View  Result Date: 03/07/2021 CLINICAL DATA:  Or shortness of breath.  Question sepsis. EXAM: PORTABLE CHEST 1 VIEW COMPARISON:  01/11/2021.  12/27/2020. FINDINGS: Heart size is unchanged. Aortic tortuosity as seen previously. Chronic interstitial lung disease as seen previously. Pulmonary opacity appears slightly more pronounced in there could be an element of superimposed edema or pneumonia. No dense consolidation or collapse.  IMPRESSION: Background pattern of chronic interstitial lung disease. Pulmonary density appears slightly more pronounced, indicating that there could be an element of superimposed acute edema or infiltrate. No dense consolidation. Electronically Signed   By: Nelson Chimes M.D.   On: 03/07/2021 11:48    EKG: Independently reviewed. No ischemic changes.   Assessment/Plan Principal Problem:   Influenza A with pneumonia Active Problems:   Coronary artery disease   Diabetes type 2, controlled ([name])   Hypertension   Chronic diastolic CHF (congestive heart failure) ([facility])   IPF (idiopathic pulmonary fibrosis) ([facility])   Sacral decubitus ulcer   Pressure injury of skin   Sepsis ([place])   Acute on chronic respiratory failure with hypoxemia ([facility])   # Influenza A pneumonia # Sepsis # Acute on chronic hypoxic respiratory failure 2 days of symptoms. Flu a positive. No focal consolidation on CXR. Covid negative. Hypoxic per EMS requiring 6 L to maintain o2. Sepsis by tachycardia, tachypnea, fever, elevated lactate. Procal is elevated - continue tamiflu, renally-dosed - continue O2, wean as able - f/u abg; monitor for bipap needs - has received 1 L fluids, repeat lactate pending. Another bolus of 1 L and maintenance fluids ordered - hemodynamically stable but BPs soft, is at significant risk of decompensation, will place in step-down - methylprednisone - think prudent to cover for possible CAP with cefepime (anti-pseudomonal given structural lung disease and recent IV abx). Will check sputum for culture and urine legionella and strep. If clinical improvement would d/c abx. Will also check mrsa screen - confirmed she is full code  # Pulmonary fibrosis - steroids and o2 as above - schedule albuterol and prn, scheduled budesonide - home esbriet is not on formulary, will need to discuss with patient she can bring from home  # T2DM Here glucose mildly elevated - hold  home metformin - start SSI - likely  will need initiation of basal insulin  # HTN Here borderline hypotensive - hold home metoprolol for now  # CAD Non-obstructive on cath some 10 years ago, neg stress test in 2017. No chest pain. - cont home aspirin, statin  # Gout - home allopurinol  # Stage 2 sacral decubitus ulcer POA. Small, no signs infection - local wound care  # GERD - protonix  DVT prophylaxis: lovenox Code Status: full Family Communication: daughter updated @ bedside  Consults called: none   Level of care: Stepdown Status is: Inpatient  Remains inpatient appropriate because: severity of illness        Desma Maxim MD [facility] Pager [phone]  If 7PM-7AM, please contact night-coverage [email or web address] Password [email or web address]  03/07/2021, 1:50 PM

## 2021-03-07 NOTE — Consult Note (Signed)
Pharmacy Antibiotic Note  Sierra Guzman is a 80 y.o. female with history of idiopathic pulmonary fibrosis, admitted on 03/07/2021 with acute on chronic respiratory failure in the setting of flu A pneumonia.  Pharmacy has been consulted for cefepime dosing for possible superimposed bacterial pneumonia.  Plan: Cefepime 2g IV q12h  Monitor clinical picture and renal function F/U C&S, abx deescalation / LOT  Height: 5\' 7"  (170.2 cm) Weight: 68 kg (150 lb) IBW/kg (Calculated) : 61.6  Temp (24hrs), Avg:102 F (38.9 C), Min:101.9 F (38.8 C), Max:102 F (38.9 C)  Recent Labs  Lab 03/07/21 1124  WBC 8.1  CREATININE 1.30*  LATICACIDVEN 3.1*    Estimated Creatinine Clearance: 33 mL/min (A) (by C-G formula based on SCr of 1.3 mg/dL (H)).    Allergies  Allergen Reactions   Morphine And Related Nausea And Vomiting    Antimicrobials this admission: 11/28 ceftriaxone and azithromycin x 1 in the ED  11/28 tamiflu >>  11/28 cefepime >>   Dose adjustments this admission: N/A  Microbiology results: 11/28 BCx: pending  11/28 Sputum: pending  11/28 MRSA PCR: pending   Thank you for allowing pharmacy to be a part of this patient's care.  12/28, PharmD 03/07/2021 2:08 PM

## 2021-03-07 NOTE — Consult Note (Signed)
CODE SEPSIS - PHARMACY COMMUNICATION  **Broad Spectrum Antibiotics should be administered within 1 hour of Sepsis diagnosis**  Time Code Sepsis Called/Page Received: 1116  Antibiotics Ordered: 1116  Time of 1st antibiotic administration: 1134  Additional action taken by pharmacy: N/A  If necessary, Name of Provider/Nurse Contacted: N/A    Derrek Gu ,PharmD Clinical Pharmacist  03/07/2021  11:38 AM

## 2021-03-07 NOTE — Sepsis Progress Note (Signed)
eLink monitoring code sepsis.  

## 2021-03-08 LAB — URINE CULTURE: Culture: NO GROWTH

## 2021-03-08 LAB — CBC
HCT: 29.4 % — ABNORMAL LOW (ref 36.0–46.0)
Hemoglobin: 9.1 g/dL — ABNORMAL LOW (ref 12.0–15.0)
MCH: 30.7 pg (ref 26.0–34.0)
MCHC: 31 g/dL (ref 30.0–36.0)
MCV: 99.3 fL (ref 80.0–100.0)
Platelets: 165 10*3/uL (ref 150–400)
RBC: 2.96 MIL/uL — ABNORMAL LOW (ref 3.87–5.11)
RDW: 17 % — ABNORMAL HIGH (ref 11.5–15.5)
WBC: 6.8 10*3/uL (ref 4.0–10.5)
nRBC: 0 % (ref 0.0–0.2)

## 2021-03-08 LAB — COMPREHENSIVE METABOLIC PANEL
ALT: 10 U/L (ref 0–44)
AST: 30 U/L (ref 15–41)
Albumin: 2.4 g/dL — ABNORMAL LOW (ref 3.5–5.0)
Alkaline Phosphatase: 41 U/L (ref 38–126)
Anion gap: 4 — ABNORMAL LOW (ref 5–15)
BUN: 22 mg/dL (ref 8–23)
CO2: 22 mmol/L (ref 22–32)
Calcium: 8.4 mg/dL — ABNORMAL LOW (ref 8.9–10.3)
Chloride: 111 mmol/L (ref 98–111)
Creatinine, Ser: 1.21 mg/dL — ABNORMAL HIGH (ref 0.44–1.00)
GFR, Estimated: 45 mL/min — ABNORMAL LOW (ref >60)
Glucose, Bld: 265 mg/dL — ABNORMAL HIGH (ref 70–99)
Potassium: 4.1 mmol/L (ref 3.5–5.1)
Sodium: 137 mmol/L (ref 135–145)
Total Bilirubin: 0.5 mg/dL (ref 0.3–1.2)
Total Protein: 5.8 g/dL — ABNORMAL LOW (ref 6.5–8.1)

## 2021-03-08 LAB — GLUCOSE, CAPILLARY
Glucose-Capillary: 250 mg/dL — ABNORMAL HIGH (ref 70–99)
Glucose-Capillary: 258 mg/dL — ABNORMAL HIGH (ref 70–99)
Glucose-Capillary: 218 mg/dL — ABNORMAL HIGH (ref 70–99)
Glucose-Capillary: 182 mg/dL — ABNORMAL HIGH (ref 70–99)
Glucose-Capillary: 191 mg/dL — ABNORMAL HIGH (ref 70–99)

## 2021-03-08 LAB — BRAIN NATRIURETIC PEPTIDE: B Natriuretic Peptide: 304.3 pg/mL — ABNORMAL HIGH (ref 0.0–100.0)

## 2021-03-08 LAB — CBG MONITORING, ED: Glucose-Capillary: 209 mg/dL — ABNORMAL HIGH (ref 70–99)

## 2021-03-08 LAB — LACTIC ACID, PLASMA: Lactic Acid, Venous: 2.2 mmol/L (ref 0.5–1.9)

## 2021-03-08 MED ORDER — MELATONIN 5 MG PO TABS: 5.0000 mg | ORAL_TABLET | Freq: Every evening | ORAL | Status: DC | PRN

## 2021-03-08 MED ORDER — SALINE SPRAY 0.65 % NA SOLN
2.0000 | NASAL | Status: DC | PRN
  Administered 2021-03-08 (×2): 2 via NASAL
  Filled 2021-03-08: qty 44

## 2021-03-08 MED ORDER — CHLORHEXIDINE GLUCONATE CLOTH 2 % EX PADS
6.0000 | MEDICATED_PAD | Freq: Every day | CUTANEOUS | Status: DC
  Administered 2021-03-08 – 2021-03-09 (×2): 6 via TOPICAL

## 2021-03-08 NOTE — Progress Notes (Signed)
Inpatient Diabetes Program Recommendations  AACE/ADA: New Consensus Statement on Inpatient Glycemic Control   Target Ranges:  Prepandial:   less than 140 mg/dL      Peak postprandial:   less than 180 mg/dL (1-2 hours)      Critically ill patients:  140 - 180 mg/dL    Latest Reference Range & Units 03/07/21 16:22 03/07/21 21:10 03/08/21 07:40 03/08/21 08:27  Glucose-Capillary 70 - 99 mg/dL 549 (H) 826 (H) 415 (H) 250 (H)  (H): Data is abnormally high  Review of Glycemic Control  Diabetes history: DM2 Outpatient Diabetes medications: Metformin 850 mg BID Current orders for Inpatient glycemic control: Novolog 0-15 units TID with meals, Novolog 0-5 units QHS; Solumedrol 125 mg Q12H  Inpatient Diabetes Program Recommendations:    Insulin: If steroids are continued, please consider ordering Levemir 7 units Q24H.   Thanks, Orlando Penner, RN, MSN, CDE Diabetes Coordinator Inpatient Diabetes Program [phone] (Team Pager from 8am to 5pm)

## 2021-03-08 NOTE — Progress Notes (Signed)
PROGRESS NOTE    KNOX HOLDMAN  WSF:[id number] DOB: Aug 11, 1940 DOA: 03/07/2021 PCP: Ethelda Chick, MD  [id number]/[id number]-[id number]   Assessment & Plan:   Principal Problem:   Influenza A with pneumonia Active Problems:   Coronary artery disease   Diabetes type 2, controlled (HCC)   Hypertension   Chronic diastolic CHF (congestive heart failure) (HCC)   IPF (idiopathic pulmonary fibrosis) (HCC)   Sacral decubitus ulcer   Pressure injury of skin   Sepsis (HCC)   Acute on chronic respiratory failure with hypoxemia (HCC)   Ilhan SHAVONTAE GIBEAULT is a 80 y.o. female with medical history significant for idiopathic pulmonary fibrosis, chronic hypoxic respiratory failure on 2 L at baseline, non-obstructive CAD, HTN, DM, gout, who presented with cough, shortness of breath.   Admitted last month where treated for CAP and exacerbation of her ILD.   Reports exposure to grandchild with influenza. 2 days ago patient developed non-productive cough, body aches, and progressive fatigue. No n/v/d, is tolerating by mouth. No chest pain. Does endorse fevers. No dysuria of increased frequency. Biggest complaint is feeling very weak. Lives with her daughter. Followed by pulm and cardiology as an outpatient.  # Influenza A pneumonia --cont tamiflu  # Acute on chronic hypoxic respiratory failure Chronic hypoxic respiratory failure on 2L O2 baseline --likely 2/2 to flu.  No focal consolidation on CXR.  Covid negative. Hypoxic per EMS requiring 6 L to maintain o2.  --started on cefepime on admission Plan: --cont to tx for flu --d/c cefepime as no strong evidence for bacterial PNA currently --Continue supplemental O2 to keep sats between 88-92%, wean as tolerated  # Sepsis Sepsis by tachycardia, tachypnea, fever, elevated lactate.  --s/p IV boluses --d/c further MIVF  # Pulmonary fibrosis - started on high-dose IV solumedrol and scheduled nebs on presentation  Plan: --Pulm consult today, pt's outpatient pulm Dr.  Karna Christmas to see --cont IV solumedrol for now, pending pulm rec - schedule albuterol and prn, scheduled budesonide - home esbriet is not on formulary, she can bring from home   # T2DM Here glucose mildly elevated - hold home metformin --cont SSI   # HTN Here borderline hypotensive --hold home metop for now   # CAD Non-obstructive on cath some 10 years ago, neg stress test in 2017. No chest pain. - cont home aspirin, statin   # Gout - home allopurinol   # Stage 2 sacral decubitus ulcer POA.  Small, no signs infection - local wound care   # GERD - protonix   DVT prophylaxis: Lovenox SQ Code Status: Full code  Family Communication:  Level of care: Med-Surg Dispo:   The patient is from: home Anticipated d/c is to: home Anticipated d/c date is: 2-3 days Patient currently is not medically ready to d/c due to: flu, increased O2 requirement, pending pulm consult   Subjective and Interval History:  Pt reported breathing improved today.     Objective: Vitals:   03/08/21 1900 03/08/21 2000 03/08/21 2109 03/08/21 2319  BP: 127/70 114/61 (!) 124/55   Pulse: 85 (!) 103 87   Resp: (!) 30 (!) 21 20   Temp: 98 F (36.7 C)  97.7 F (36.5 C)   TempSrc:   Oral   SpO2: 90% 100% 95% 98%  Weight:      Height:        Intake/Output Summary (Last 24 hours) at 03/09/2021 0406 Last data filed at 03/09/2021 0103 Gross per 24 hour  Intake 2871.29 ml  Output  2050 ml  Net 821.29 ml   Filed Weights   03/07/21 1137 03/08/21 1400  Weight: 68 kg 68.3 kg    Examination:   Constitutional: NAD, AAOx3 HEENT: conjunctivae and lids normal, EOMI CV: No cyanosis.   RESP: increased RR, on 4L Extremities: No effusions, edema in BLE SKIN: warm, dry Neuro: II - XII grossly intact.   Psych: Normal mood and affect.  Appropriate judgement and reason   Data Reviewed: I have personally reviewed following labs and imaging studies  CBC: Recent Labs  Lab 03/07/21 1124 03/08/21 0648   WBC 8.1 6.8  NEUTROABS 6.9  --   HGB 11.0* 9.1*  HCT 35.2* 29.4*  MCV 99.7 99.3  PLT 202 [id number]   Basic Metabolic Panel: Recent Labs  Lab 03/07/21 1124 03/08/21 0648  NA 136 137  K 4.0 4.1  CL 101 111  CO2 27 22  GLUCOSE 178* 265*  BUN 21 22  CREATININE 1.30* 1.21*  CALCIUM 9.5 8.4*   GFR: Estimated Creatinine Clearance: 35.5 mL/min (A) (by C-G formula based on SCr of 1.21 mg/dL (H)). Liver Function Tests: Recent Labs  Lab 03/07/21 1124 03/08/21 0648  AST 34 30  ALT 8 10  ALKPHOS 48 41  BILITOT 0.5 0.5  PROT 7.1 5.8*  ALBUMIN 3.2* 2.4*   No results for input(s): LIPASE, AMYLASE in the last 168 hours. No results for input(s): AMMONIA in the last 168 hours. Coagulation Profile: Recent Labs  Lab 03/07/21 1124  INR 1.1   Cardiac Enzymes: No results for input(s): CKTOTAL, CKMB, CKMBINDEX, TROPONINI in the last 168 hours. BNP (last 3 results) No results for input(s): PROBNP in the last 8760 hours. HbA1C: No results for input(s): HGBA1C in the last 72 hours. CBG: Recent Labs  Lab 03/08/21 0740 03/08/21 0827 03/08/21 1134 03/08/21 1641 03/08/21 2122  GLUCAP 258* 250* 218* 182* 191*   Lipid Profile: No results for input(s): CHOL, HDL, LDLCALC, TRIG, CHOLHDL, LDLDIRECT in the last 72 hours. Thyroid Function Tests: No results for input(s): TSH, T4TOTAL, FREET4, T3FREE, THYROIDAB in the last 72 hours. Anemia Panel: No results for input(s): VITAMINB12, FOLATE, FERRITIN, TIBC, IRON, RETICCTPCT in the last 72 hours. Sepsis Labs: Recent Labs  Lab 03/07/21 1124 03/07/21 1344 03/07/21 2024 03/08/21 0648  PROCALCITON 1.72  --   --   --   LATICACIDVEN 3.1* 2.4* 2.1* 2.2*    Recent Results (from the past 240 hour(s))  Resp Panel by RT-PCR (Flu A&B, Covid) Nasopharyngeal Swab     Status: Abnormal   Collection Time: 03/07/21 11:24 AM   Specimen: Nasopharyngeal Swab; Nasopharyngeal(NP) swabs in vial transport medium  Result Value Ref Range Status   SARS  Coronavirus 2 by RT PCR NEGATIVE NEGATIVE Final    Comment: (NOTE) SARS-CoV-2 target nucleic acids are NOT DETECTED.  The SARS-CoV-2 RNA is generally detectable in upper respiratory specimens during the acute phase of infection. The lowest concentration of SARS-CoV-2 viral copies this assay can detect is 138 copies/mL. A negative result does not preclude SARS-Cov-2 infection and should not be used as the sole basis for treatment or other patient management decisions. A negative result may occur with  improper specimen collection/handling, submission of specimen other than nasopharyngeal swab, presence of viral mutation(s) within the areas targeted by this assay, and inadequate number of viral copies(<138 copies/mL). A negative result must be combined with clinical observations, patient history, and epidemiological information. The expected result is Negative.  Fact Sheet for Patients:  EntrepreneurPulse.com.au  Fact Sheet for  Healthcare Providers:  IncredibleEmployment.be  This test is no t yet approved or cleared by the [place] and  has been authorized for detection and/or diagnosis of SARS-CoV-2 by FDA under an Emergency Use Authorization (EUA). This EUA will remain  in effect (meaning this test can be used) for the duration of the COVID-19 declaration under Section 564(b)(1) of the Act, 21 U.S.C.section 360bbb-3(b)(1), unless the authorization is terminated  or revoked sooner.       Influenza A by PCR POSITIVE (A) NEGATIVE Final   Influenza B by PCR NEGATIVE NEGATIVE Final    Comment: (NOTE) The Xpert Xpress SARS-CoV-2/FLU/RSV plus assay is intended as an aid in the diagnosis of influenza from Nasopharyngeal swab specimens and should not be used as a sole basis for treatment. Nasal washings and aspirates are unacceptable for Xpert Xpress SARS-CoV-2/FLU/RSV testing.  Fact Sheet for  Patients: EntrepreneurPulse.com.au  Fact Sheet for Healthcare Providers: IncredibleEmployment.be  This test is not yet approved or cleared by the [place] FDA and has been authorized for detection and/or diagnosis of SARS-CoV-2 by FDA under an Emergency Use Authorization (EUA). This EUA will remain in effect (meaning this test can be used) for the duration of the COVID-19 declaration under Section 564(b)(1) of the Act, 21 U.S.C. section 360bbb-3(b)(1), unless the authorization is terminated or revoked.  Performed at [facility], [address] [place], [place] [address]   Blood Culture (routine x 2)     Status: None (Preliminary result)   Collection Time: 03/07/21 11:24 AM   Specimen: BLOOD  Result Value Ref Range Status   Specimen Description BLOOD RIGHT ANTECUBITAL  Final   Special Requests   Final    BOTTLES DRAWN AEROBIC AND ANAEROBIC Blood Culture adequate volume   Culture   Final    NO GROWTH 1 DAY Performed at [facility], [address] [place], [place] [address]    Report Status PENDING  Incomplete  Blood Culture (routine x 2)     Status: None (Preliminary result)   Collection Time: 03/07/21 11:24 AM   Specimen: BLOOD  Result Value Ref Range Status   Specimen Description BLOOD BLOOD LEFT HAND  Final   Special Requests   Final    BOTTLES DRAWN AEROBIC AND ANAEROBIC Blood Culture results may not be optimal due to an inadequate volume of blood received in culture bottles   Culture   Final    NO GROWTH 1 DAY Performed at [facility], [address] [place], [place] [address]    Report Status PENDING  Incomplete  Urine Culture     Status: None   Collection Time: 03/07/21  3:44 PM   Specimen: In/Out Cath Urine  Result Value Ref Range Status   Specimen Description   Final    IN/OUT CATH URINE Performed at [facility], [address], [place], [place] [address]    Special Requests    Final    NONE Performed at [facility], [address] [place], [place] [address]    Culture   Final    NO GROWTH Performed at [facility], [place] [address] [place], [place] [address]    Report Status 03/08/2021 FINAL  Final  MRSA Next Gen by PCR, Nasal     Status: None   Collection Time: 03/07/21  4:47 PM   Specimen: Nasal Mucosa; Nasal Swab  Result Value Ref Range Status   MRSA by PCR Next Gen NOT DETECTED NOT DETECTED Final  Comment: (NOTE) The GeneXpert MRSA Assay (FDA approved for NASAL specimens only), is one component of a comprehensive MRSA colonization surveillance program. It is not intended to diagnose MRSA infection nor to guide or monitor treatment for MRSA infections. Test performance is not FDA approved in patients less than 41 years old. Performed at [facility], [address], [place], [place] [address]       Radiology Studies: [facility] Chest [place] 1 View  Result Date: 03/07/2021 CLINICAL DATA:  Or shortness of breath.  Question sepsis. EXAM: PORTABLE CHEST 1 VIEW COMPARISON:  01/11/2021.  12/27/2020. FINDINGS: Heart size is unchanged. Aortic tortuosity as seen previously. Chronic interstitial lung disease as seen previously. Pulmonary opacity appears slightly more pronounced in there could be an element of superimposed edema or pneumonia. No dense consolidation or collapse. IMPRESSION: Background pattern of chronic interstitial lung disease. Pulmonary density appears slightly more pronounced, indicating that there could be an element of superimposed acute edema or infiltrate. No dense consolidation. Electronically Signed   By: Nelson Chimes M.D.   On: 03/07/2021 11:48     Scheduled Meds:  albuterol  2.5 mg Nebulization Q4H   allopurinol  300 mg Oral Daily   aspirin EC  81 mg Oral Daily   budesonide (PULMICORT) nebulizer solution  0.25 mg Nebulization BID   Chlorhexidine Gluconate Cloth  6 each Topical Daily   enoxaparin (LOVENOX)  injection  40 mg Subcutaneous Q24H   gabapentin  100 mg Oral TID   insulin aspart  0-15 Units Subcutaneous TID WC   insulin aspart  0-5 Units Subcutaneous QHS   methylPREDNISolone (SOLU-MEDROL) injection  125 mg Intravenous Q12H   oseltamivir  30 mg Oral BID   pantoprazole  40 mg Oral Daily   rosuvastatin  40 mg Oral Daily   Continuous Infusions:   LOS: 2 days     Enzo Bi, MD [facility] If 7PM-7AM, please contact night-coverage 03/09/2021, 4:06 AM

## 2021-03-08 NOTE — ED Notes (Signed)
Pt given graham crackers and applesauce per her request.

## 2021-03-08 NOTE — ED Notes (Signed)
Reina RN aware of assigned bed 

## 2021-03-08 NOTE — ED Notes (Signed)
Pt provided her cellphone to speak with her daughter.

## 2021-03-08 NOTE — ED Notes (Signed)
CBG 258 

## 2021-03-08 NOTE — Evaluation (Signed)
Physical Therapy Evaluation Patient Details Name: Sierra Guzman MRN: [id number] DOB: 1940/09/20 Today's Date: 03/08/2021  History of Present Illness  Patient is a 80 y.o. female with medical history significant for idiopathic pulmonary fibrosis, chronic hypoxic respiratory failure on 2 L at baseline, non-obstructive CAD, HTN, DM, gout, who presents with cough, shortness of breath. Found to have Influenza A with pneumonia, sepsis.  Clinical Impression  Patient agreeable to PT and expressed that she wants to go home at discharge. She was recently ambulating without assistive device at home and lives with her family.  She required no physical assistance for standing or bed mobility. She demonstrated good static standing balance without UE support. Ambulation not attempted due to fatigue with activity. Sp02 briefly down to 85-86% after standing and quickly increased to 90% with rest break and pursed lip breathing techniques used. Patient is hopeful to discharge home. Recommend PT follow up to maximize independence and facilitate return to prior level of function. Anticipate patient be able to return home with HHPT at discharge.      Recommendations for follow up therapy are one component of a multi-disciplinary discharge planning process, led by the attending physician.  Recommendations may be updated based on patient status, additional functional criteria and insurance authorization.  Follow Up Recommendations Home health PT    Assistance Recommended at Discharge Set up Supervision/Assistance  Functional Status Assessment Patient has had a recent decline in their functional status and demonstrates the ability to make significant improvements in function in a reasonable and predictable amount of time.  Equipment Recommendations  None recommended by PT    Recommendations for Other Services       Precautions / Restrictions Precautions Precautions: Fall Restrictions Weight Bearing Restrictions:  No      Mobility  Bed Mobility Overal bed mobility: Modified Independent             General bed mobility comments: head of bed elevated and no use of bed rails. increased time required    Transfers Overall transfer level: Needs assistance Equipment used: None Transfers: Sit to/from Stand Sit to Stand: Supervision           General transfer comment: good safety awreness is demonstrated with transfers with no physical assistance needed    Ambulation/Gait               General Gait Details: ambulation not attempted due to fatigue. good standing balance demonstrated  Stairs            Wheelchair Mobility    Modified Rankin (Stroke Patients Only)       Balance Overall balance assessment: Needs assistance Sitting-balance support: Feet supported Sitting balance-Leahy Scale: Good     Standing balance support: No upper extremity supported Standing balance-Leahy Scale: Good Standing balance comment: no loss of balance in standing position without UE supported                             Pertinent Vitals/Pain Pain Assessment: No/denies pain    Home Living Family/patient expects to be discharged to:: Private residence Living Arrangements: Children Available Help at Discharge: Family;Available PRN/intermittently Type of Home: House Home Access: Stairs to enter   Entrance Stairs-Number of Steps: 4     Home Equipment: Rollator (4 wheels) (oxygen) Additional Comments: patient has home health nursing and PT per her report. was at a rehab facility with discharge to home recently, but has been home for a while  now.    Prior Function Prior Level of Function : Independent/Modified Independent             Mobility Comments: independent with oxygen       Hand Dominance        Extremity/Trunk Assessment   Upper Extremity Assessment Upper Extremity Assessment: Generalized weakness    Lower Extremity Assessment Lower Extremity  Assessment: Generalized weakness       Communication      Cognition Arousal/Alertness: Awake/alert Behavior During Therapy: WFL for tasks assessed/performed Overall Cognitive Status: Within Functional Limits for tasks assessed                                 General Comments: patient able to follow all commands without difficulty        General Comments General comments (skin integrity, edema, etc.): Sp02 briefly decreased to high 80's after mobility, and increased quickly to 90's with rest break and cues for breathing techniques. patient demonstrated understanding of pursed lip breathing. She was on 6L 02 during this session    Exercises     Assessment/Plan    PT Assessment Patient needs continued PT services  PT Problem List Decreased strength;Decreased balance;Decreased activity tolerance;Decreased mobility;Cardiopulmonary status limiting activity       PT Treatment Interventions DME instruction;Gait training;Stair training;Functional mobility training;Therapeutic activities;Therapeutic exercise;Neuromuscular re-education;Balance training;Patient/family education    PT Goals (Current goals can be found in the Care Plan section)  Acute Rehab PT Goals Patient Stated Goal: to go home as soon as possible PT Goal Formulation: With patient Time For Goal Achievement: 03/22/21 Potential to Achieve Goals: Good    Frequency Min 2X/week   Barriers to discharge        Co-evaluation               AM-PAC PT "6 Clicks" Mobility  Outcome Measure Help needed turning from your back to your side while in a flat bed without using bedrails?: None Help needed moving from lying on your back to sitting on the side of a flat bed without using bedrails?: None Help needed moving to and from a bed to a chair (including a wheelchair)?: A Little Help needed standing up from a chair using your arms (e.g., wheelchair or bedside chair)?: A Little Help needed to walk in  hospital room?: A Little Help needed climbing 3-5 steps with a railing? : A Little 6 Click Score: 20    End of Session Equipment Utilized During Treatment: Oxygen Activity Tolerance: Patient tolerated treatment well Patient left: in bed;with call bell/phone within reach;with bed alarm set Nurse Communication: Mobility status PT Visit Diagnosis: Muscle weakness (generalized) (M62.81);Difficulty in walking, not elsewhere classified (R26.2)    Time: 4034-7425 PT Time Calculation (min) (ACUTE ONLY): 25 min   Charges:   PT Evaluation $PT Eval Moderate Complexity: 1 Mod PT Treatments $Therapeutic Activity: 8-22 mins        Donna Bernard, PT, MPT   Ina Homes 03/08/2021, 11:28 AM

## 2021-03-08 NOTE — Consult Note (Signed)
Pulmonary Medicine          Date: 03/08/2021,   MRN# [id number] Sierra Guzman 1940/11/20     AdmissionWeight: 68 kg                 CurrentWeight: 68.3 kg   Referring physician: Dr Fran Lowes   CHIEF COMPLAINT:   Acute on chronic hypoxemic respiratory failure.    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  She also has hx of chronic anemia, anxiety disorder, arthritis, asthma , CAD, degenerative disk disease, GERD, dyslipidemia, HTN, who came in with respiratory failure and acute on chronic hypoxemia. She generally has been on 2-3L/min with progression to 4L/min recently and now has noted increased O2 requirement to >5L/min.  She has been admitted for acute exacerbation of ILD in the past 30days. She was exposed to flu from grandchildren and reports myalgia and malaise. She was brought up to step down unit due to symptoms and in the interim found to be acutely positive for influenza infection. She was placed on tamiflu and HFNC and subsequently improved. I was able to speak to her daugher [name] and review findings and hospital course with additional discussion of short term medical plan. PCCM consultation for additional evaluation and management.    PAST MEDICAL HISTORY   Past Medical History:  Diagnosis Date  . Anemia 10/28/2010   unspecified  . Anxiety   . Aortic valve sclerosis   . Arthritis   . Asthma   . Chronic diastolic CHF (congestive heart failure) (HCC)    Echo 12/2020 = nl EF, Grade 1 DD, normal RV function, no mention of pulm art pressure though  . Chronic kidney disease    Chronic Kidney Disease---Stage III  . Coronary artery disease 10/28/2010  . DDD (degenerative disc disease), lumbar 2006  . Diabetes type 2, controlled (HCC) 10/28/2010  . Dyspnea   . GERD (gastroesophageal reflux disease)   . Gout 10/28/2010  . Heart murmur   . History of cardiac catheterization  2006   LAD 50%, D1 50% rest normal.   . History of cardiovascular stress test 09/23/2009    lexiscan Normal Cardiolite test, EF 61%. LV size and function - normal  . History of echocardiogram 05/27/2008   EF 55%; Mild aortic insufficiency with minimal aortic sclerosis. L vent normal.   . Hyperlipidemia   . Hyperlipidemia LDL goal <100 10/28/2010   managed by Dr. Burr Medico  . Hypertension 10/28/2010  . IPF (idiopathic pulmonary fibrosis) (HCC)    Progressive, with UIP  . Mild vitamin D deficiency 02/22/2011  . Obesity (BMI 30-39.9) 10/28/2010   unspecified  . 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 Personal history of colonic polyps 10/28/2010  . Shortness of breath   . Syncope 2013  . Vitamin B 12 deficiency 10/28/2010     SURGICAL HISTORY   Past Surgical History:  Procedure Laterality Date  . BACK SURGERY  2006   DDD  . BREAST BIOPSY Right    neg  . CARDIAC CATHETERIZATION  2006  . EYE SURGERY Bilateral    Cataract Extraction with IOL  . JOINT REPLACEMENT Left    Left Total Hip Replacement  . JOINT REPLACEMENT     RIGHT 2005; LEFT 2 X 2004, 2011  . REPLACEMENT TOTAL KNEE Left   . REVISION TOTAL KNEE ARTHROPLASTY Left  Dr. Marry Guan  . TONSILLECTOMY    . TOTAL HIP ARTHROPLASTY Right 12/12/2016   Procedure: TOTAL HIP ARTHROPLASTY ANTERIOR APPROACH;  Surgeon: Hessie Knows, MD;  Location: [facility];  Service: Orthopedics;  Laterality: Right;  . TOTAL HIP ARTHROPLASTY Left 12/26/2013   Procedure: ARTHROPLASTY HIP TOTAL; Surgeon: Zorita Pang, MD; Location: [facility]; Service: Orthopedics; Laterality: left,   . TOTAL KNEE ARTHROPLASTY     both knees     FAMILY HISTORY   Family History  Problem Relation Age of Onset  . Breast cancer Mother 22  . Diabetes Mother   . Stroke Mother   . Stroke Brother   . Breast cancer Cousin        pat cousin     SOCIAL HISTORY   Social History   Tobacco Use  . Smoking status: Former    Packs/day: 0.25     Years: 10.00    Pack years: 2.50    Types: Cigarettes    Quit date: 08/18/1990    Years since quitting: 30.5  . Smokeless tobacco: Never  Vaping Use  . Vaping Use: Never used  Substance Use Topics  . Alcohol use: No  . Drug use: No     MEDICATIONS    Home Medication:    Current Medication:  Current Facility-Administered Medications:  .  acetaminophen (TYLENOL) tablet 650 mg, 650 mg, Oral, Q6H PRN, Gwynne Edinger, MD, 650 mg at 03/08/21 0506 .  albuterol (PROVENTIL) (2.5 MG/3ML) 0.083% nebulizer solution 2.5 mg, 2.5 mg, Nebulization, Q4H, Wouk, Ailene Rud, MD, 2.5 mg at 03/08/21 1524 .  albuterol (PROVENTIL) (2.5 MG/3ML) 0.083% nebulizer solution 2.5 mg, 2.5 mg, Nebulization, Q2H PRN, Wouk, Ailene Rud, MD .  allopurinol (ZYLOPRIM) tablet 300 mg, 300 mg, Oral, Daily, Wouk, Ailene Rud, MD, 300 mg at 03/08/21 0925 .  aspirin EC tablet 81 mg, 81 mg, Oral, Daily, Wouk, Ailene Rud, MD, 81 mg at 03/08/21 0925 .  budesonide (PULMICORT) nebulizer solution 0.25 mg, 0.25 mg, Nebulization, BID, Wouk, Ailene Rud, MD, 0.25 mg at 03/08/21 0743 .  Chlorhexidine Gluconate Cloth 2 % PADS 6 each, 6 each, Topical, Daily, Wouk, Ailene Rud, MD, 6 each at 03/08/21 [phone]  enoxaparin (LOVENOX) injection 40 mg, 40 mg, Subcutaneous, Q24H, Wouk, Ailene Rud, MD, 40 mg at 03/07/21 2118 .  gabapentin (NEURONTIN) capsule 100 mg, 100 mg, Oral, TID, Wouk, Ailene Rud, MD, 100 mg at 03/08/21 1554 .  insulin aspart (novoLOG) injection 0-15 Units, 0-15 Units, Subcutaneous, TID WC, Wouk, Ailene Rud, MD, 5 Units at 03/08/21 1226 .  insulin aspart (novoLOG) injection 0-5 Units, 0-5 Units, Subcutaneous, QHS, Wouk, Ailene Rud, MD, 2 Units at 03/07/21 2116 .  melatonin tablet 5 mg, 5 mg, Oral, QHS PRN, Cox, Amy N, DO .  methylPREDNISolone sodium succinate (SOLU-MEDROL) 125 mg/2 mL injection 125 mg, 125 mg, Intravenous, Q12H, Wouk, Ailene Rud, MD, 125 mg at 03/08/21 1555 .  oseltamivir (TAMIFLU)  capsule 30 mg, 30 mg, Oral, BID, Wouk, Ailene Rud, MD, 30 mg at 03/08/21 1031 .  pantoprazole (PROTONIX) EC tablet 40 mg, 40 mg, Oral, Daily, Wouk, Ailene Rud, MD, 40 mg at 03/08/21 0925 .  rosuvastatin (CRESTOR) tablet 40 mg, 40 mg, Oral, Daily, Wouk, Ailene Rud, MD, 40 mg at 03/08/21 1555 .  sodium chloride (OCEAN) 0.65 % nasal spray 2 spray, 2 spray, Each Nare, PRN, Cox, Amy N, DO, 2 spray at 03/08/21 1557    ALLERGIES   Morphine and related     REVIEW  OF SYSTEMS    Review of Systems:  Gen:  Denies  fever, sweats, chills weigh loss  HEENT: Denies blurred vision, double vision, ear pain, eye pain, hearing loss, nose bleeds, sore throat Cardiac:  No dizziness, chest pain or heaviness, chest tightness,edema Resp:   Denies cough or sputum porduction, shortness of breath,wheezing, hemoptysis,  Gi: Denies swallowing difficulty, stomach pain, nausea or vomiting, diarrhea, constipation, bowel incontinence Gu:  Denies bladder incontinence, burning urine Ext:   Denies Joint pain, stiffness or swelling Skin: Denies  skin rash, easy bruising or bleeding or hives Endoc:  Denies polyuria, polydipsia , polyphagia or weight change Psych:   Denies depression, insomnia or hallucinations   Other:  All other systems negative   VS: BP (!) 122/58   Pulse 82   Temp 98 F (36.7 C) (Oral)   Resp (!) 22   Ht 5\' 7"  (1.702 m)   Wt 68.3 kg   SpO2 95%   BMI 23.58 kg/m      PHYSICAL EXAM    GENERAL:NAD, no fevers, chills, no weakness no fatigue HEAD: Normocephalic, atraumatic.  EYES: Pupils equal, round, reactive to light. Extraocular muscles intact. No scleral icterus.  MOUTH: Moist mucosal membrane. Dentition intact. No abscess noted.  EAR, NOSE, THROAT: Clear without exudates. No external lesions.  NECK: Supple. No thyromegaly. No nodules. No JVD.  PULMONARY: Diffuse coarse rhonchi right sided +wheezes CARDIOVASCULAR: S1 and S2. Regular rate and rhythm. No murmurs, rubs, or  gallops. No edema. Pedal pulses 2+ bilaterally.  GASTROINTESTINAL: Soft, nontender, nondistended. No masses. Positive bowel sounds. No hepatosplenomegaly.  MUSCULOSKELETAL: No swelling, clubbing, or edema. Range of motion full in all extremities.  NEUROLOGIC: Cranial nerves II through XII are intact. No gross focal neurological deficits. Sensation intact. Reflexes intact.  SKIN: No ulceration, lesions, rashes, or cyanosis. Skin warm and dry. Turgor intact.  PSYCHIATRIC: Mood, affect within normal limits. The patient is awake, alert and oriented x 3. Insight, judgment intact.       IMAGING    DG Chest Port 1 View  Result Date: 03/07/2021 CLINICAL DATA:  Or shortness of breath.  Question sepsis. EXAM: PORTABLE CHEST 1 VIEW COMPARISON:  01/11/2021.  12/27/2020. FINDINGS: Heart size is unchanged. Aortic tortuosity as seen previously. Chronic interstitial lung disease as seen previously. Pulmonary opacity appears slightly more pronounced in there could be an element of superimposed edema or pneumonia. No dense consolidation or collapse. IMPRESSION: Background pattern of chronic interstitial lung disease. Pulmonary density appears slightly more pronounced, indicating that there could be an element of superimposed acute edema or infiltrate. No dense consolidation. Electronically Signed   By: Nelson Chimes M.D.   On: 03/07/2021 11:48      ASSESSMENT/PLAN   Acute Influenza A infection           - continue tamiflu - patient is improved symptomatically           - may downgrade to medical floor  ILD with pulmonary fibrosis          -patient has tolerated Pirfenidone well and this therapy may be resumed post Tamiflu course  - agree with steroids - IV solumedrol - will reduce dosage today - agree with respiratory therapy- Agree with albuterol and pulmicort   CKD    -please be vigilant regarding infusions and fluid balance     - reduce /minimize nephrotoxins    -patient on protonix      Thank  you for allowing me to participate in the care  of this patient.  Total face to face encounter time for this patient visit was > 45 min. >50% of the time was  spent in counseling and coordination of care.   Patient/Family are satisfied with care plan and all questions have been answered.  This document was prepared using Dragon voice recognition software and may include unintentional dictation errors.     Ottie Glazier, M.D.  Division of [facility]

## 2021-03-08 NOTE — ED Notes (Signed)
Called  ICU to give report to assigned nurse for room 08. Spoke With Byrd Hesselbach, Charity fundraiser.

## 2021-03-09 LAB — CBC
HCT: 28.2 % — ABNORMAL LOW (ref 36.0–46.0)
Hemoglobin: 9 g/dL — ABNORMAL LOW (ref 12.0–15.0)
MCH: 31.5 pg (ref 26.0–34.0)
MCHC: 31.9 g/dL (ref 30.0–36.0)
MCV: 98.6 fL (ref 80.0–100.0)
Platelets: 176 10*3/uL (ref 150–400)
RBC: 2.86 MIL/uL — ABNORMAL LOW (ref 3.87–5.11)
RDW: 16.9 % — ABNORMAL HIGH (ref 11.5–15.5)
WBC: 10.9 10*3/uL — ABNORMAL HIGH (ref 4.0–10.5)
nRBC: 0 % (ref 0.0–0.2)

## 2021-03-09 LAB — MAGNESIUM: Magnesium: 1.7 mg/dL (ref 1.7–2.4)

## 2021-03-09 LAB — EXPECTORATED SPUTUM ASSESSMENT W GRAM STAIN, RFLX TO RESP C

## 2021-03-09 LAB — GLUCOSE, CAPILLARY
Glucose-Capillary: 171 mg/dL — ABNORMAL HIGH (ref 70–99)
Glucose-Capillary: 187 mg/dL — ABNORMAL HIGH (ref 70–99)
Glucose-Capillary: 150 mg/dL — ABNORMAL HIGH (ref 70–99)
Glucose-Capillary: 215 mg/dL — ABNORMAL HIGH (ref 70–99)

## 2021-03-09 LAB — LEGIONELLA PNEUMOPHILA SEROGP 1 UR AG: L. pneumophila Serogp 1 Ur Ag: NEGATIVE

## 2021-03-09 LAB — BASIC METABOLIC PANEL
Anion gap: 5 (ref 5–15)
BUN: 19 mg/dL (ref 8–23)
CO2: 23 mmol/L (ref 22–32)
Calcium: 8.9 mg/dL (ref 8.9–10.3)
Chloride: 109 mmol/L (ref 98–111)
Creatinine, Ser: 1.16 mg/dL — ABNORMAL HIGH (ref 0.44–1.00)
GFR, Estimated: 47 mL/min — ABNORMAL LOW (ref >60)
Glucose, Bld: 163 mg/dL — ABNORMAL HIGH (ref 70–99)
Potassium: 3.8 mmol/L (ref 3.5–5.1)
Sodium: 137 mmol/L (ref 135–145)

## 2021-03-09 MED ORDER — INSULIN ASPART 100 UNIT/ML IJ SOLN
0.0000 [IU] | Freq: Three times a day (TID) | INTRAMUSCULAR | Status: DC
  Administered 2021-03-10: 4 [IU] via SUBCUTANEOUS
  Administered 2021-03-10: 7 [IU] via SUBCUTANEOUS
  Filled 2021-03-09 (×2): qty 1

## 2021-03-09 MED ORDER — GUAIFENESIN-DM 100-10 MG/5ML PO SYRP
5.0000 mL | ORAL_SOLUTION | ORAL | Status: DC | PRN
  Administered 2021-03-10: 5 mL via ORAL
  Filled 2021-03-09: qty 5

## 2021-03-09 NOTE — Progress Notes (Addendum)
PROGRESS NOTE    Sierra Guzman  [id number] DOB: Jun 28, 1940 DOA: 03/07/2021 PCP: Wardell Honour, MD  [id number]/[id number]-AA   Assessment & Plan:   Principal Problem:   Influenza A with pneumonia Active Problems:   Coronary artery disease   Diabetes type 2, controlled ([place])   Hypertension   Chronic diastolic CHF (congestive heart failure) (HCC)   IPF (idiopathic pulmonary fibrosis) (HCC)   Sacral decubitus ulcer   Pressure injury of skin   Sepsis ([place])   Acute on chronic respiratory failure with hypoxemia (HCC)   Sierra Guzman is a 80 y.o. female with medical history significant for idiopathic pulmonary fibrosis, chronic hypoxic respiratory failure on 2 L at baseline, non-obstructive CAD, HTN, DM, gout, who presented with cough, shortness of breath.   Admitted last month where treated for CAP and exacerbation of her ILD.   Reports exposure to grandchild with influenza. 2 days ago patient developed non-productive cough, body aches, and progressive fatigue. No n/v/d, is tolerating by mouth. No chest pain. Does endorse fevers. No dysuria of increased frequency. Biggest complaint is feeling very weak. Lives with her daughter. Followed by pulm and cardiology as an outpatient.  # Influenza A pneumonia --cont Tamiflu for a 5-day course  # Acute on chronic hypoxic respiratory failure Chronic hypoxic respiratory failure on 2L O2 baseline --likely 2/2 to flu.  No focal consolidation on CXR.  Covid negative. Hypoxic per EMS requiring 6 L to maintain o2.  --started on cefepime on admission, since d/c'ed since no strong evidence of bacterial PNA. --currently sating 98-100% on 4L Plan: --cont to tx for flu --Continue supplemental O2 to keep sats >=90%, wean as tolerated  # Severe Sepsis Sepsis by tachycardia, tachypnea, fever, elevated lactate.  --s/p IV boluses  # Pulmonary fibrosis - started on high-dose IV solumedrol 125 mg q12h and scheduled nebs on presentation  --pulm consulted, Dr.  Lanney Gins following Plan: --f/u pulm rec --cont IV solumedrol for now, pending pulm rec - schedule albuterol and prn, scheduled budesonide - home esbriet is not on formulary, she can bring from home   # T2DM Here glucose mildly elevated - hold home metformin --change SSI to resistant scale   # HTN Here borderline hypotensive --hold home metop for now   # CAD Non-obstructive on cath some 10 years ago, neg stress test in 2017. No chest pain. - cont home aspirin, statin   # Gout - home allopurinol   # Stage 2 sacral decubitus ulcer POA.  Small, no signs infection - local wound care   # GERD --cont PPI  # CKD 3a --Cr at baseline   DVT prophylaxis: Lovenox SQ Code Status: Full code  Family Communication:  Level of care: Med-Surg Dispo:   The patient is from: home Anticipated d/c is to: home Anticipated d/c date is: 1-2 days Patient currently is not medically ready to d/c due to: flu, increased O2 requirement, pending pulm consult   Subjective and Interval History:  Pt reported doing better today, was focusing on breathing enough to get her O2 sats to 100% while looking at her continuous pulse ox.  Normal oral intake and urination.   Objective: Vitals:   03/09/21 0825 03/09/21 1656 03/09/21 2046 03/09/21 2051  BP: 138/69 132/78 109/61   Pulse: 62 77 100   Resp: 20 20 20    Temp: 98.4 F (36.9 C) 97.8 F (36.6 C) (!) 97.5 F (36.4 C)   TempSrc: Oral Oral Oral   SpO2: 99% 97% 99% 99%  Weight:      Height:        Intake/Output Summary (Last 24 hours) at 03/09/2021 2138 Last data filed at 03/09/2021 1711 Gross per 24 hour  Intake 0 ml  Output 1500 ml  Net -1500 ml   Filed Weights   03/07/21 1137 03/08/21 1400  Weight: 68 kg 68.3 kg    Examination:   Constitutional: NAD, AAOx3 HEENT: conjunctivae and lids normal, EOMI CV: No cyanosis.   RESP: normal respiratory effort, soft mild crackles over posterior bases Extremities: No effusions, edema in  BLE SKIN: warm, dry Neuro: II - XII grossly intact.   Psych: Normal mood and affect.  Appropriate judgement and reason   Data Reviewed: I have personally reviewed following labs and imaging studies  CBC: Recent Labs  Lab 03/07/21 1124 03/08/21 0648 03/09/21 0446  WBC 8.1 6.8 10.9*  NEUTROABS 6.9  --   --   HGB 11.0* 9.1* 9.0*  HCT 35.2* 29.4* 28.2*  MCV 99.7 99.3 98.6  PLT 202 165 0000000   Basic Metabolic Panel: Recent Labs  Lab 03/07/21 1124 03/08/21 0648 03/09/21 0446  NA 136 137 137  K 4.0 4.1 3.8  CL 101 111 109  CO2 27 22 23   GLUCOSE 178* 265* 163*  BUN 21 22 19   CREATININE 1.30* 1.21* 1.16*  CALCIUM 9.5 8.4* 8.9  MG  --   --  1.7   GFR: Estimated Creatinine Clearance: 37 mL/min (A) (by C-G formula based on SCr of 1.16 mg/dL (H)). Liver Function Tests: Recent Labs  Lab 03/07/21 1124 03/08/21 0648  AST 34 30  ALT 8 10  ALKPHOS 48 41  BILITOT 0.5 0.5  PROT 7.1 5.8*  ALBUMIN 3.2* 2.4*   No results for input(s): LIPASE, AMYLASE in the last 168 hours. No results for input(s): AMMONIA in the last 168 hours. Coagulation Profile: Recent Labs  Lab 03/07/21 1124  INR 1.1   Cardiac Enzymes: No results for input(s): CKTOTAL, CKMB, CKMBINDEX, TROPONINI in the last 168 hours. BNP (last 3 results) No results for input(s): PROBNP in the last 8760 hours. HbA1C: No results for input(s): HGBA1C in the last 72 hours. CBG: Recent Labs  Lab 03/08/21 2122 03/09/21 0826 03/09/21 1147 03/09/21 1657 03/09/21 2057  GLUCAP 191* 171* 187* 150* 215*   Lipid Profile: No results for input(s): CHOL, HDL, LDLCALC, TRIG, CHOLHDL, LDLDIRECT in the last 72 hours. Thyroid Function Tests: No results for input(s): TSH, T4TOTAL, FREET4, T3FREE, THYROIDAB in the last 72 hours. Anemia Panel: No results for input(s): VITAMINB12, FOLATE, FERRITIN, TIBC, IRON, RETICCTPCT in the last 72 hours. Sepsis Labs: Recent Labs  Lab 03/07/21 1124 03/07/21 1344 03/07/21 2024  03/08/21 0648  PROCALCITON 1.72  --   --   --   LATICACIDVEN 3.1* 2.4* 2.1* 2.2*    Recent Results (from the past 240 hour(s))  Resp Panel by RT-PCR (Flu A&B, Covid) Nasopharyngeal Swab     Status: Abnormal   Collection Time: 03/07/21 11:24 AM   Specimen: Nasopharyngeal Swab; Nasopharyngeal(NP) swabs in vial transport medium  Result Value Ref Range Status   SARS Coronavirus 2 by RT PCR NEGATIVE NEGATIVE Final    Comment: (NOTE) SARS-CoV-2 target nucleic acids are NOT DETECTED.  The SARS-CoV-2 RNA is generally detectable in upper respiratory specimens during the acute phase of infection. The lowest concentration of SARS-CoV-2 viral copies this assay can detect is 138 copies/mL. A negative result does not preclude SARS-Cov-2 infection and should not be used as the sole basis  for treatment or other patient management decisions. A negative result may occur with  improper specimen collection/handling, submission of specimen other than nasopharyngeal swab, presence of viral mutation(s) within the areas targeted by this assay, and inadequate number of viral copies(<138 copies/mL). A negative result must be combined with clinical observations, patient history, and epidemiological information. The expected result is Negative.  Fact Sheet for Patients:  EntrepreneurPulse.com.au  Fact Sheet for Healthcare Providers:  IncredibleEmployment.be  This test is no t yet approved or cleared by the [place] FDA and  has been authorized for detection and/or diagnosis of SARS-CoV-2 by FDA under an Emergency Use Authorization (EUA). This EUA will remain  in effect (meaning this test can be used) for the duration of the COVID-19 declaration under Section 564(b)(1) of the Act, 21 U.S.C.section 360bbb-3(b)(1), unless the authorization is terminated  or revoked sooner.       Influenza A by PCR POSITIVE (A) NEGATIVE Final   Influenza B by PCR NEGATIVE NEGATIVE  Final    Comment: (NOTE) The Xpert Xpress SARS-CoV-2/FLU/RSV plus assay is intended as an aid in the diagnosis of influenza from Nasopharyngeal swab specimens and should not be used as a sole basis for treatment. Nasal washings and aspirates are unacceptable for Xpert Xpress SARS-CoV-2/FLU/RSV testing.  Fact Sheet for Patients: EntrepreneurPulse.com.au  Fact Sheet for Healthcare Providers: IncredibleEmployment.be  This test is not yet approved or cleared by the [place] FDA and has been authorized for detection and/or diagnosis of SARS-CoV-2 by FDA under an Emergency Use Authorization (EUA). This EUA will remain in effect (meaning this test can be used) for the duration of the COVID-19 declaration under Section 564(b)(1) of the Act, 21 U.S.C. section 360bbb-3(b)(1), unless the authorization is terminated or revoked.  Performed at [facility], [address], [place] [address]   Blood Culture (routine x 2)     Status: None (Preliminary result)   Collection Time: 03/07/21 11:24 AM   Specimen: BLOOD  Result Value Ref Range Status   Specimen Description BLOOD RIGHT ANTECUBITAL  Final   Special Requests   Final    BOTTLES DRAWN AEROBIC AND ANAEROBIC Blood Culture adequate volume   Culture   Final    NO GROWTH 2 DAYS Performed at [facility], [address] [place], [place] [address]    Report Status PENDING  Incomplete  Blood Culture (routine x 2)     Status: None (Preliminary result)   Collection Time: 03/07/21 11:24 AM   Specimen: BLOOD  Result Value Ref Range Status   Specimen Description BLOOD BLOOD LEFT HAND  Final   Special Requests   Final    BOTTLES DRAWN AEROBIC AND ANAEROBIC Blood Culture results may not be optimal due to an inadequate volume of blood received in culture bottles   Culture   Final    NO GROWTH 2 DAYS Performed at [facility], [address] [place], [place]  [address]    Report Status PENDING  Incomplete  Urine Culture     Status: None   Collection Time: 03/07/21  3:44 PM   Specimen: In/Out Cath Urine  Result Value Ref Range Status   Specimen Description   Final    IN/OUT CATH URINE Performed at [facility], [address], [place], [place] [address]    Special Requests   Final    NONE Performed at [facility], [address], [place],  [address]    Culture   Final  NO GROWTH Performed at [facility], [address], [place], [place] [address]    Report Status 03/08/2021 FINAL  Final  MRSA Next Gen by PCR, Nasal     Status: None   Collection Time: 03/07/21  4:47 PM   Specimen: Nasal Mucosa; Nasal Swab  Result Value Ref Range Status   MRSA by PCR Next Gen NOT DETECTED NOT DETECTED Final    Comment: (NOTE) The GeneXpert MRSA Assay (FDA approved for NASAL specimens only), is one component of a comprehensive MRSA colonization surveillance program. It is not intended to diagnose MRSA infection nor to guide or monitor treatment for MRSA infections. Test performance is not FDA approved in patients less than 75 years old. Performed at [facility], [address], [place], [place] [address]       Radiology Studies: No results found.   Scheduled Meds:  albuterol  2.5 mg Nebulization Q4H   allopurinol  300 mg Oral Daily   aspirin EC  81 mg Oral Daily   budesonide (PULMICORT) nebulizer solution  0.25 mg Nebulization BID   Chlorhexidine Gluconate Cloth  6 each Topical Daily   enoxaparin (LOVENOX) injection  40 mg Subcutaneous Q24H   gabapentin  100 mg Oral TID   insulin aspart  0-15 Units Subcutaneous TID WC   insulin aspart  0-5 Units Subcutaneous QHS   methylPREDNISolone (SOLU-MEDROL) injection  125 mg Intravenous Q12H   oseltamivir  30 mg Oral BID   pantoprazole  40 mg Oral Daily   rosuvastatin  40 mg Oral Daily   Continuous Infusions:   LOS: 2 days     Darlin Priestly, MD [facility] If 7PM-7AM, please contact night-coverage 03/09/2021, 9:38 PM

## 2021-03-09 NOTE — TOC Initial Note (Addendum)
Transition of Care [facility]) - Initial/Assessment Note    Patient Details  Name: Sierra Guzman MRN: [id number] Date of Birth: 1940/07/10  Transition of Care [facility]) CM/SW Contact:    Candie Chroman, LCSW Phone Number: 03/09/2021, 12:39 PM  Clinical Narrative:    Readmission prevention screen complete. CSW met with patient. No supports at bedside. CSW introduced role and explained that discharge planning would be discussed. Patient is currently staying with her daughter until she feels well enough to return home alone. PCP is Reginia Forts, MD at [facility] but she reports she has not seen her in a while. Daughter transports to appointments. [facility] [place]. No issues obtaining medications. Patient is active with [facility] for PT, OT, RN, and aide. Patient is on oxygen at home through [place]. She reports weaning herself down from 6 L. She now uses 4 L while up and moving but decreases to 2 L when sitting down for long periods of time or sleeping. Patient said Dr. Lanney Gins was going to see about getting her a new portable. [place] representative will check into this. No further concerns. CSW encouraged patient to contact CSW as needed. CSW will continue to follow patient for support and facilitate return home when stable.             2:57 pm: Received call back from [employer]. They requested DME order and to enter "Oxygen conserving device titration" in the comments section so they can come to the home for portable oxygen eval. Patient may not qualify with higher oxygen needs. They also asked about getting her a nebulizer. Sent information in secure chat to Dr. Billie Ruddy and Dr. Lanney Gins.  Expected Discharge Plan: [place] Barriers to Discharge: Continued Medical Work up   Patient Goals and CMS Choice     Choice offered to / list presented to : NA  Expected Discharge Plan and Services Expected Discharge Plan: [place] Acute Care Choice: Resumption of Svcs/PTA Provider Living arrangements for the past 2 months: Single Family Home                           HH Arranged: RN, PT, OT, Nurse's Aide HH Agency: [place] Date [place]: 03/09/21   Representative spoke with at [place]: [place] [employer]  Prior Living Arrangements/Services Living arrangements for the past 2 months: [place] Lives with:: Adult Children Patient language and need for interpreter reviewed:: Yes Do you feel safe going back to the place where you live?: Yes      Need for Family Participation in Patient Care: Yes (Comment) Care giver support system in place?: Yes (comment) Current home services: DME, Homehealth aide, Home OT, Home PT, Home RN Criminal Activity/Legal Involvement Pertinent to Current Situation/Hospitalization: No - Comment as needed  Activities of Daily Living Home Assistive Devices/Equipment: Oxygen, Walker (specify type) ADL Screening (condition at time of admission) Patient's cognitive ability adequate to safely complete daily activities?: Yes Is the patient deaf or have difficulty hearing?: No Does the patient have difficulty seeing, even when wearing glasses/contacts?: No Does the patient have difficulty concentrating, remembering, or making decisions?: No Patient able to express need for assistance with ADLs?: Yes Does the patient have difficulty dressing or bathing?: Yes Independently performs ADLs?: No Communication: Independent Dressing (OT): Needs assistance Is this a change from baseline?: Pre-admission baseline Grooming:  Needs assistance Is this a change from baseline?: Pre-admission baseline Feeding: Independent Bathing: Needs assistance Is this a change from baseline?: Pre-admission baseline Toileting: Needs assistance Is this a change from baseline?: Pre-admission baseline In/Out Bed: Independent with device (comment) Walks in Home:  Independent with device (comment) Does the patient have difficulty walking or climbing stairs?: Yes Weakness of Legs: Left Weakness of Arms/Hands: None  Permission Sought/Granted Permission sought to share information with : Facility Art therapist granted to share information with : Yes, Verbal Permission Granted     Permission granted to share info w AGENCY: [facility]        Emotional Assessment Appearance:: Appears stated age Attitude/Demeanor/Rapport: Engaged, Gracious Affect (typically observed): Accepting, Appropriate, Calm, Pleasant Orientation: : Oriented to Self, Oriented to Place, Oriented to  Time, Oriented to Situation Alcohol / Substance Use: Not Applicable Psych Involvement: No (comment)  Admission diagnosis:  Pulmonary fibrosis ([place]) [J84.10] Influenza A [J10.1] Influenza A with pneumonia [J09.X1] Acute on chronic respiratory failure with hypoxia (HCC) [J96.21] Type 2 diabetes mellitus without complication, without long-term current use of insulin (HCC) [E11.9] Patient Active Problem List   Diagnosis Date Noted   Influenza A with pneumonia 03/07/2021   Sepsis ([place]) 03/07/2021   Acute on chronic respiratory failure with hypoxemia ([place]) 03/07/2021   Pressure injury of skin 01/13/2021   CKD (chronic kidney disease) stage 3, GFR 30-59 ml/min ([place]) 01/01/2021   ARF (acute renal failure) (HCC) 12/28/2020   Chronic diastolic CHF (congestive heart failure) ([place]) 12/27/2020   AKI (acute kidney injury) ([place]) 12/27/2020   IPF (idiopathic pulmonary fibrosis) ([place]) 12/27/2020   Sacral decubitus ulcer 12/27/2020   Occult blood positive stool 12/27/2020   DDD (degenerative disc disease), cervical 12/21/2020   Hereditary and idiopathic peripheral neuropathy 12/21/2020   Localized, primary osteoarthritis 12/21/2020   Low back pain 12/21/2020   Pain in limb 12/21/2020   Chronic respiratory failure with hypoxia ([place]) 12/05/2020   Acute respiratory failure  with hypoxia ([place]) 12/05/2020   Hip pain 08/06/2020   Spinal stenosis in cervical region 08/06/2020   Spondylolisthesis, congenital 08/06/2020   Status post total hip replacement, 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   Primary localized osteoarthritis of right hip 12/12/2016   B12 deficiency 10/28/2010   Coronary artery disease 10/28/2010   Diabetes type 2, controlled ([place]) 10/28/2010   Gout 10/28/2010   Hyperlipidemia with target LDL less than 100 10/28/2010   Hypertension 10/28/2010   Osteoarthritis of left knee 10/28/2010   Anemia 10/28/2010   History of colonic polyps 10/28/2010   PCP:  Wardell Honour, MD Pharmacy[id number] [place] [place] [id number]: [phone]: [phone]    Social Determinants of Health (SDOH) Interventions    Readmission Risk Interventions Readmission Risk Prevention Plan 03/09/2021  Transportation Screening Complete  PCP or Specialist Appt within 3-5 Days Complete  HRI or Home Care Consult Complete  Social Work Consult for [place]/Counseling Complete  Palliative Care Screening Not Applicable  Medication Review (Press photographer Complete  Some recent data might be hidden

## 2021-03-10 ENCOUNTER — Inpatient Hospital Stay: Payer: Medicare HMO

## 2021-03-10 LAB — BASIC METABOLIC PANEL
Anion gap: 4 — ABNORMAL LOW (ref 5–15)
BUN: 19 mg/dL (ref 8–23)
CO2: 25 mmol/L (ref 22–32)
Calcium: 8.9 mg/dL (ref 8.9–10.3)
Chloride: 109 mmol/L (ref 98–111)
Creatinine, Ser: 1.1 mg/dL — ABNORMAL HIGH (ref 0.44–1.00)
GFR, Estimated: 50 mL/min — ABNORMAL LOW (ref >60)
Glucose, Bld: 137 mg/dL — ABNORMAL HIGH (ref 70–99)
Potassium: 3.4 mmol/L — ABNORMAL LOW (ref 3.5–5.1)
Sodium: 138 mmol/L (ref 135–145)

## 2021-03-10 LAB — CBC
HCT: 30.2 % — ABNORMAL LOW (ref 36.0–46.0)
Hemoglobin: 9.6 g/dL — ABNORMAL LOW (ref 12.0–15.0)
MCH: 31.1 pg (ref 26.0–34.0)
MCHC: 31.8 g/dL (ref 30.0–36.0)
MCV: 97.7 fL (ref 80.0–100.0)
Platelets: 198 K/uL (ref 150–400)
RBC: 3.09 MIL/uL — ABNORMAL LOW (ref 3.87–5.11)
RDW: 16.9 % — ABNORMAL HIGH (ref 11.5–15.5)
WBC: 10.2 K/uL (ref 4.0–10.5)
nRBC: 0 % (ref 0.0–0.2)

## 2021-03-10 LAB — GLUCOSE, CAPILLARY
Glucose-Capillary: 177 mg/dL — ABNORMAL HIGH (ref 70–99)
Glucose-Capillary: 223 mg/dL — ABNORMAL HIGH (ref 70–99)
Glucose-Capillary: 117 mg/dL — ABNORMAL HIGH (ref 70–99)

## 2021-03-10 LAB — EXPECTORATED SPUTUM ASSESSMENT W GRAM STAIN, RFLX TO RESP C

## 2021-03-10 LAB — MAGNESIUM: Magnesium: 1.9 mg/dL (ref 1.7–2.4)

## 2021-03-10 MED ORDER — OSELTAMIVIR PHOSPHATE 30 MG PO CAPS
30.0000 mg | ORAL_CAPSULE | Freq: Two times a day (BID) | ORAL | 0 refills | Status: AC
Start: 2021-03-10 — End: 2021-03-13

## 2021-03-10 MED ORDER — GABAPENTIN 100 MG PO CAPS: 100.0000 mg | ORAL_CAPSULE | Freq: Three times a day (TID) | ORAL | 0 refills | Status: DC

## 2021-03-10 MED ORDER — BISACODYL 10 MG RE SUPP
10.0000 mg | Freq: Every day | RECTAL | Status: DC | PRN
  Filled 2021-03-10: qty 1

## 2021-03-10 MED ORDER — PREDNISONE 10 MG PO TABS: 10.0000 mg | ORAL_TABLET | Freq: Every day | ORAL | 0 refills | Status: DC

## 2021-03-10 MED ORDER — METHYLPREDNISOLONE SODIUM SUCC 125 MG IJ SOLR
80.0000 mg | Freq: Two times a day (BID) | INTRAMUSCULAR | Status: DC
  Administered 2021-03-10: 80 mg via INTRAVENOUS
  Filled 2021-03-10: qty 2

## 2021-03-10 MED ORDER — POLYETHYLENE GLYCOL 3350 17 G PO PACK
17.0000 g | PACK | Freq: Every day | ORAL | Status: DC
  Administered 2021-03-10: 17 g via ORAL
  Filled 2021-03-10: qty 1

## 2021-03-10 MED ORDER — SALINE SPRAY 0.65 % NA SOLN
2.0000 | NASAL | 0 refills | Status: AC | PRN
Start: 2021-03-10 — End: ?

## 2021-03-10 MED ORDER — PREDNISONE 10 MG PO TABS: ORAL_TABLET | ORAL | 0 refills | Status: AC

## 2021-03-10 MED ORDER — ALBUTEROL SULFATE (2.5 MG/3ML) 0.083% IN NEBU: 2.5000 mg | INHALATION_SOLUTION | RESPIRATORY_TRACT | 12 refills | Status: AC | PRN

## 2021-03-10 MED ORDER — BUDESONIDE 0.25 MG/2ML IN SUSP: 0.2500 mg | Freq: Two times a day (BID) | RESPIRATORY_TRACT | 12 refills | Status: DC

## 2021-03-10 NOTE — Progress Notes (Addendum)
Pulmonary Medicine          Date: 03/10/2021,   MRN# [id number] Sierra Guzman Mar 06, 1941     AdmissionWeight: 68 kg                 CurrentWeight: 68.3 kg   Referring physician: Dr Fran Lowes   CHIEF COMPLAINT:   Acute on chronic hypoxemic respiratory failure.    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  She also has hx of chronic anemia, anxiety disorder, arthritis, asthma , CAD, degenerative disk disease, GERD, dyslipidemia, HTN, who came in with respiratory failure and acute on chronic hypoxemia. She generally has been on 2-3L/min with progression to 4L/min recently and now has noted increased O2 requirement to >5L/min.  She has been admitted for acute exacerbation of ILD in the past 30days. She was exposed to flu from grandchildren and reports myalgia and malaise. She was brought up to step down unit due to symptoms and in the interim found to be acutely positive for influenza infection. She was placed on tamiflu and HFNC and subsequently improved. I was able to speak to her daugher Sierra Guzman and review findings and hospital course with additional discussion of short term medical plan. PCCM consultation for additional evaluation and management.   03/10/21- patient is stable on 2-3L/min [place].  She is in process of working with CM and [employer] to have small POC oxygen for home. She is stable for DC home and can resume her home regimen after finishinng tamiflu.  She is to remain on prednisone at 50mg  daily and should taper dose by 5mg  daily until I see her in clinic at [facility] pulmonology. May resume IPF meds post tamiflu.   PAST MEDICAL HISTORY   Past Medical History:  Diagnosis Date  . Anemia 10/28/2010   unspecified  . Anxiety   . Aortic valve sclerosis   . Arthritis   . Asthma   . Chronic diastolic CHF (congestive heart failure) (HCC)    Echo 12/2020 = nl EF, Grade 1 DD, normal RV function, no  mention of pulm art pressure though  . Chronic kidney disease    Chronic Kidney Disease---Stage III  . Coronary artery disease 10/28/2010  . DDD (degenerative disc disease), lumbar 2006  . Diabetes type 2, controlled (HCC) 10/28/2010  . Dyspnea   . GERD (gastroesophageal reflux disease)   . Gout 10/28/2010  . Heart murmur   . History of cardiac catheterization 2006   LAD 50%, D1 50% rest normal.   . History of cardiovascular stress test 09/23/2009    lexiscan Normal Cardiolite test, EF 61%. LV size and function - normal  . History of echocardiogram 05/27/2008   EF 55%; Mild aortic insufficiency with minimal aortic sclerosis. L vent normal.   . Hyperlipidemia   . Hyperlipidemia LDL goal <100 10/28/2010   managed by Dr. 05/29/2008  . Hypertension 10/28/2010  . IPF (idiopathic pulmonary fibrosis) (HCC)    Progressive, with UIP  . Mild vitamin D deficiency 02/22/2011  . Obesity (BMI 30-39.9) 10/28/2010   unspecified  . 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 Personal history of colonic polyps 10/28/2010  . Shortness of breath   . Syncope 2013  . Vitamin B 12 deficiency 10/28/2010     SURGICAL HISTORY   Past Surgical History:  Procedure Laterality Date  . BACK SURGERY  2006  DDD  . BREAST BIOPSY Right    neg  . CARDIAC CATHETERIZATION  2006  . EYE SURGERY Bilateral    Cataract Extraction with IOL  . JOINT REPLACEMENT Left    Left Total Hip Replacement  . JOINT REPLACEMENT     RIGHT 2005; LEFT 2 X 2004, 2011  . REPLACEMENT TOTAL KNEE Left   . REVISION TOTAL KNEE ARTHROPLASTY Left    Dr. Marry Guan  . TONSILLECTOMY    . TOTAL HIP ARTHROPLASTY Right 12/12/2016   Procedure: TOTAL HIP ARTHROPLASTY ANTERIOR APPROACH;  Surgeon: Hessie Knows, MD;  Location: [facility] ORS;  Service: Orthopedics;  Laterality: Right;  . TOTAL HIP ARTHROPLASTY Left 12/26/2013   Procedure: ARTHROPLASTY HIP TOTAL; Surgeon: Zorita Pang, MD; Location: [facility]; Service: Orthopedics;  Laterality: left,   . TOTAL KNEE ARTHROPLASTY     both knees     FAMILY HISTORY   Family History  Problem Relation Age of Onset  . Breast cancer Mother 32  . Diabetes Mother   . Stroke Mother   . Stroke Brother   . Breast cancer Cousin        pat cousin     SOCIAL HISTORY   Social History   Tobacco Use  . Smoking status: Former    Packs/day: 0.25    Years: 10.00    Pack years: 2.50    Types: Cigarettes    Quit date: 08/18/1990    Years since quitting: 30.5  . Smokeless tobacco: Never  Vaping Use  . Vaping Use: Never used  Substance Use Topics  . Alcohol use: No  . Drug use: No     MEDICATIONS    Home Medication:    Current Medication:  Current Facility-Administered Medications:  .  acetaminophen (TYLENOL) tablet 650 mg, 650 mg, Oral, Q6H PRN, Gwynne Edinger, MD, 650 mg at 03/09/21 0207 .  albuterol (PROVENTIL) (2.5 MG/3ML) 0.083% nebulizer solution 2.5 mg, 2.5 mg, Nebulization, Q4H, Wouk, Ailene Rud, MD, 2.5 mg at 03/10/21 0737 .  albuterol (PROVENTIL) (2.5 MG/3ML) 0.083% nebulizer solution 2.5 mg, 2.5 mg, Nebulization, Q2H PRN, Wouk, Ailene Rud, MD .  allopurinol (ZYLOPRIM) tablet 300 mg, 300 mg, Oral, Daily, Wouk, Ailene Rud, MD, 300 mg at 03/09/21 [id number] .  aspirin EC tablet 81 mg, 81 mg, Oral, Daily, Wouk, Ailene Rud, MD, 81 mg at 03/09/21 [id number] .  bisacodyl (DULCOLAX) suppository 10 mg, 10 mg, Rectal, Daily PRN, Sharion Settler, NP .  budesonide (PULMICORT) nebulizer solution 0.25 mg, 0.25 mg, Nebulization, BID, Wouk, Ailene Rud, MD, 0.25 mg at 03/10/21 0737 .  Chlorhexidine Gluconate Cloth 2 % PADS 6 each, 6 each, Topical, Daily, Wouk, Ailene Rud, MD, 6 each at 03/09/21 1203 .  enoxaparin (LOVENOX) injection 40 mg, 40 mg, Subcutaneous, Q24H, Wouk, Ailene Rud, MD, 40 mg at 03/09/21 2111 .  gabapentin (NEURONTIN) capsule 100 mg, 100 mg, Oral, TID, Wouk, Ailene Rud, MD, 100 mg at 03/09/21 2110 .  guaiFENesin-dextromethorphan (ROBITUSSIN  DM) 100-10 MG/5ML syrup 5 mL, 5 mL, Oral, Q4H PRN, Sharion Settler, NP, 5 mL at 03/10/21 0150 .  insulin aspart (novoLOG) injection 0-20 Units, 0-20 Units, Subcutaneous, TID WC, Enzo Bi, MD .  insulin aspart (novoLOG) injection 0-5 Units, 0-5 Units, Subcutaneous, QHS, Wouk, Ailene Rud, MD, 2 Units at 03/09/21 2111 .  melatonin tablet 5 mg, 5 mg, Oral, QHS PRN, Cox, Amy N, DO .  methylPREDNISolone sodium succinate (SOLU-MEDROL) 125 mg/2 mL injection 80 mg, 80 mg, Intravenous, Q12H, Ottie Glazier, MD .  oseltamivir (TAMIFLU) capsule 30 mg, 30 mg, Oral, BID, Wouk, Ailene Rud, MD, 30 mg at 03/09/21 2110 .  pantoprazole (PROTONIX) EC tablet 40 mg, 40 mg, Oral, Daily, Wouk, Ailene Rud, MD, 40 mg at 03/09/21 [id number] .  polyethylene glycol (MIRALAX / GLYCOLAX) packet 17 g, 17 g, Oral, Daily, Swayze, Ava, DO .  rosuvastatin (CRESTOR) tablet 40 mg, 40 mg, Oral, Daily, Wouk, Ailene Rud, MD, 40 mg at 03/09/21 0921 .  sodium chloride (OCEAN) 0.65 % nasal spray 2 spray, 2 spray, Each Nare, PRN, Cox, Amy N, DO, 2 spray at 03/08/21 1557    ALLERGIES   Morphine and related     REVIEW OF SYSTEMS    Review of Systems:  Gen:  Denies  fever, sweats, chills weigh loss  HEENT: Denies blurred vision, double vision, ear pain, eye pain, hearing loss, nose bleeds, sore throat Cardiac:  No dizziness, chest pain or heaviness, chest tightness,edema Resp:   Denies cough or sputum porduction, shortness of breath,wheezing, hemoptysis,  Gi: Denies swallowing difficulty, stomach pain, nausea or vomiting, diarrhea, constipation, bowel incontinence Gu:  Denies bladder incontinence, burning urine Ext:   Denies Joint pain, stiffness or swelling Skin: Denies  skin rash, easy bruising or bleeding or hives Endoc:  Denies polyuria, polydipsia , polyphagia or weight change Psych:   Denies depression, insomnia or hallucinations   Other:  All other systems negative   VS: BP (!) 152/64 (BP Location: Right Arm)    Pulse 95   Temp 97.9 F (36.6 C) (Oral)   Resp 20   Ht 5\' 7"  (1.702 m)   Wt 68.3 kg   SpO2 95%   BMI 23.58 kg/m      PHYSICAL EXAM    GENERAL:NAD, no fevers, chills, no weakness no fatigue HEAD: Normocephalic, atraumatic.  EYES: Pupils equal, round, reactive to light. Extraocular muscles intact. No scleral icterus.  MOUTH: Moist mucosal membrane. Dentition intact. No abscess noted.  EAR, NOSE, THROAT: Clear without exudates. No external lesions.  NECK: Supple. No thyromegaly. No nodules. No JVD.  PULMONARY: Diffuse coarse rhonchi right sided +wheezes CARDIOVASCULAR: S1 and S2. Regular rate and rhythm. No murmurs, rubs, or gallops. No edema. Pedal pulses 2+ bilaterally.  GASTROINTESTINAL: Soft, nontender, nondistended. No masses. Positive bowel sounds. No hepatosplenomegaly.  MUSCULOSKELETAL: No swelling, clubbing, or edema. Range of motion full in all extremities.  NEUROLOGIC: Cranial nerves II through XII are intact. No gross focal neurological deficits. Sensation intact. Reflexes intact.  SKIN: No ulceration, lesions, rashes, or cyanosis. Skin warm and dry. Turgor intact.  PSYCHIATRIC: Mood, affect within normal limits. The patient is awake, alert and oriented x 3. Insight, judgment intact.       IMAGING    DG Chest Port 1 View  Result Date: 03/07/2021 CLINICAL DATA:  Or shortness of breath.  Question sepsis. EXAM: PORTABLE CHEST 1 VIEW COMPARISON:  01/11/2021.  12/27/2020. FINDINGS: Heart size is unchanged. Aortic tortuosity as seen previously. Chronic interstitial lung disease as seen previously. Pulmonary opacity appears slightly more pronounced in there could be an element of superimposed edema or pneumonia. No dense consolidation or collapse. IMPRESSION: Background pattern of chronic interstitial lung disease. Pulmonary density appears slightly more pronounced, indicating that there could be an element of superimposed acute edema or infiltrate. No dense consolidation.  Electronically Signed   By: Nelson Chimes M.D.   On: 03/07/2021 11:48      ASSESSMENT/PLAN   Acute Influenza A infection           -  continue tamiflu - patient is improved symptomatically           - may downgrade to medical floor  ILD with pulmonary fibrosis          -patient has tolerated Pirfenidone well and this therapy may be resumed post Tamiflu course  - agree with steroids - IV solumedrol - will reduce dosage today - agree with respiratory therapy- Agree with albuterol and pulmicort   CKD    -please be vigilant regarding infusions and fluid balance     - reduce /minimize nephrotoxins    -patient on protonix      Thank you for allowing me to participate in the care of this patient.   Patient/Family are satisfied with care plan and all questions have been answered.  This document was prepared using Dragon voice recognition software and may include unintentional dictation errors.     Ottie Glazier, M.D.  Division of [place]

## 2021-03-10 NOTE — TOC Transition Note (Signed)
Transition of Care [facility]) - CM/SW Discharge Note   Patient Details  Name: Sierra Guzman MRN: [id number] Date of Birth: 1940-06-11  Transition of Care [facility]) CM/SW Contact:  Margarito Liner, LCSW Phone Number: 03/10/2021, 4:24 PM   Clinical Narrative:  Patient has orders to discharge home today. Left message for [facility] representative to notify. [facility] representative is aware. They brought an oxygen tank to the room yesterday for her to use on the way home. They are sending someone out to the home, possibly as soon as tomorrow, for the portable oxygen assessment and to bring her nebulizer machine. They will call her and see when she will allow them to come out. No further concerns. CSW signing off.  Final next level of care: Home w Home Health Services Barriers to Discharge: Barriers Resolved   Patient Goals and CMS Choice     Choice offered to / list presented to : NA  Discharge Placement                    Patient and family notified of of transfer: 03/10/21  Discharge Plan and Services     Post Acute Care Choice: Resumption of Svcs/PTA Provider          DME Arranged: Nebulizer machine DME Agency: [facility]y Lager Date DME Agency Contacted: 03/09/21   Representative spoke with at DME Agency: Morrie Sheldon HH Arranged: RN, PT, OT, Nurse's Aide HH Agency: [facility] Date [facility] Agency Contacted: 03/09/21   Representative spoke with at [facility] Agency: [place] [employer]  Social Determinants of Health (SDOH) Interventions     Readmission Risk Interventions Readmission Risk Prevention Plan 03/09/2021  Transportation Screening Complete  PCP or Specialist Appt within 3-5 Days Complete  HRI or Home Care Consult Complete  Social Work Consult for Recovery Care Planning/Counseling Complete  Palliative Care Screening Not Applicable  Medication Review [employer]) Complete  Some recent data might be hidden

## 2021-03-10 NOTE — Progress Notes (Signed)
Physical Therapy Treatment Patient Details Name: Sierra Guzman MRN: [id number] DOB: 04-02-1941 Today's Date: 03/10/2021   History of Present Illness Patient is a 80 y.o. female with medical history significant for idiopathic pulmonary fibrosis, chronic hypoxic respiratory failure on 2 L at baseline, non-obstructive CAD, HTN, DM, gout, who presents with cough, shortness of breath. Found to have Influenza A with pneumonia, sepsis.    PT Comments    Pt received in Semi-Fowler's position and agreeable to therapy.  Pt agreeable to therapy and ready to get out of bed.  Pt with good mobility and is able to participate in STS at edge of bed and requested to use the [facility].  Pt assisted with transfer and self-care with cleaning up BM.  Pt then transferred to recliner with supervision.  Pt receiving breathing treatment with concurrent seated exercises in recliner.  Pt urinated on the chair and required a clean up by therapist along with new gown and chuck pad.  Pt still fatigued with standing and sitting balance and is unable to ambulate for longer distances.  SpO2 is sporadic throughout session, alternating from high 80's to low-mid 90's.  Pt received education throughout session for pursed lip breathing and proper energy conservation when ambulating.  Current plan remains appropriate with d/c to home with HHPT services.       Recommendations for follow up therapy are one component of a multi-disciplinary discharge planning process, led by the attending physician.  Recommendations may be updated based on patient status, additional functional criteria and insurance authorization.  Follow Up Recommendations  Home health PT     Assistance Recommended at Discharge Set up Supervision/Assistance  Equipment Recommendations  None recommended by PT    Recommendations for Other Services       Precautions / Restrictions Precautions Precautions: Fall Restrictions Weight Bearing Restrictions: No     Mobility   Bed Mobility Overal bed mobility: Modified Independent             General bed mobility comments: head of bed elevated and no use of bed rails. increased time required    Transfers Overall transfer level: Needs assistance Equipment used: None Transfers: Sit to/from Stand Sit to Stand: Supervision           General transfer comment: good safety awreness is demonstrated with transfers with no physical assistance needed    Ambulation/Gait               General Gait Details: ambulation not attempted due to fatigue. good standing balance demonstrated   Stairs             Wheelchair Mobility    Modified Rankin (Stroke Patients Only)       Balance Overall balance assessment: Needs assistance Sitting-balance support: Feet supported Sitting balance-Leahy Scale: Good     Standing balance support: No upper extremity supported Standing balance-Leahy Scale: Good Standing balance comment: no loss of balance in standing position without UE supported                            Cognition Arousal/Alertness: Awake/alert Behavior During Therapy: WFL for tasks assessed/performed Overall Cognitive Status: Within Functional Limits for tasks assessed                                 General Comments: patient able to follow all commands without difficulty  Exercises      General Comments General comments (skin integrity, edema, etc.): Sp02 briefly decreased to high 80's after mobility, and increased quickly to 90's with rest break and cues for breathing techniques. patient demonstrated understanding of pursed lip breathing. She was on 4L 02 during this session      Pertinent Vitals/Pain Pain Assessment: No/denies pain    Home Living                          Prior Function            PT Goals (current goals can now be found in the care plan section) Acute Rehab PT Goals Patient Stated Goal: to go home as soon as  possible PT Goal Formulation: With patient Time For Goal Achievement: 03/22/21 Potential to Achieve Goals: Good Progress towards PT goals: Progressing toward goals    Frequency    Min 2X/week      PT Plan Current plan remains appropriate    Co-evaluation              AM-PAC PT "6 Clicks" Mobility   Outcome Measure  Help needed turning from your back to your side while in a flat bed without using bedrails?: None Help needed moving from lying on your back to sitting on the side of a flat bed without using bedrails?: None Help needed moving to and from a bed to a chair (including a wheelchair)?: A Little Help needed standing up from a chair using your arms (e.g., wheelchair or bedside chair)?: A Little Help needed to walk in hospital room?: A Little Help needed climbing 3-5 steps with a railing? : A Little 6 Click Score: 20    End of Session Equipment Utilized During Treatment: Oxygen Activity Tolerance: Patient tolerated treatment well Patient left: in bed;with call bell/phone within reach;with bed alarm set Nurse Communication: Mobility status PT Visit Diagnosis: Muscle weakness (generalized) (M62.81);Difficulty in walking, not elsewhere classified (R26.2)     Time: 3536-1443 PT Time Calculation (min) (ACUTE ONLY): 74 min  Charges:  $Therapeutic Exercise: 8-22 mins $Therapeutic Activity: 38-52 mins $Self Care/Home Management: 8-22                     Nolon Bussing, PT, DPT 03/10/21, 1:17 PM    Sierra Guzman 03/10/2021, 1:13 PM

## 2021-03-12 LAB — CULTURE, BLOOD (ROUTINE X 2)
Culture: NO GROWTH
Culture: NO GROWTH
Special Requests: ADEQUATE

## 2021-03-15 NOTE — Discharge Summary (Signed)
Physician Discharge Summary  Sierra Guzman IWP:[id number] DOB: 11/19/1940 DOA: 03/07/2021  PCP: Sierra Chick, MD  Admit date: 03/07/2021 Discharge date: 03/10/2021  Recommendations for Outpatient Follow-up:  Discharge to home.  Follow up with Dr. Genice Rouge in 2 weeks. Follow up with PCP in 7-10 days.  Continue O2 by nasal congestion. 2L continuous at rest and 4L for activity.   Follow-up Information     Sierra Rigger, MD. Go on 03/21/2021.   Specialty: Pulmonary Disease Why: You already have a appointment on 03/21/21 at 9:00am. Contact information: [address] [place] [address] [phone]         Sierra Chick, MD Follow up in 1 week(s).   Specialty: Family Medicine Contact information: [address] [place] [address] [phone]         Sierra Fus, MD .   Specialty: Cardiology Contact information: [address] [place] [address] [phone]                  Discharge Diagnoses: Principal diagnosis is #1    Influenza A with pneumonia   Coronary artery disease   Diabetes type 2, controlled (HCC)   Hypertension   Chronic diastolic CHF (congestive heart failure) (HCC)   IPF (idiopathic pulmonary fibrosis) (HCC)   Sacral decubitus ulcer   Pressure injury of skin   Sepsis (HCC)  Discharge Condition: Fair  Disposition: Home with home health services  Diet recommendation: Heart healthy/Carbohydrate modified  Filed Weights   03/07/21 1137 03/08/21 1400  Weight: 68 kg 68.3 kg    History of present illness:  Sierra Guzman is a 80 y.o. female with medical history significant for idiopathic pulmonary fibrosis, chronic hypoxic respiratory failure on 2 L at baseline, non-obstructive CAD, HTN, DM, gout, who presents with the above.   Admitted last month where treated for CAP and exacerbation of her ILD.   Reports exposure to grandchild with influenza. 2 days ago patient developed non-productive  cough, body aches, and progressive fatigue. No n/v/d, is tolerating by mouth. No chest pain. Does endorse fevers. No dysuria of increased frequency. Biggest complaint is feeling very weak. Lives with her daughter. Followed by pulm and cardiology as an outpatient.   ED Course:    Code sepsis. 1 L fluids, ceftriaxone/azithromycin given, flu a positive, tamiflu ordered. Labs and cxr ordered.   Hospital Course:  The patient was admitted. She received IV fluids as part of the sepsis protocol. It resolved. She was treated with tamiflu. She was initially treated with cefepime empirically to treat a putative pneumonia. When this was disproven it was discontinued. Initially the patient required 6L O2 by [place]. She received steroids. She was continued on ASA and statin. Initially her metoprolol was held due to her hypotensive.   On 03/10/2021 the patient's respiratory status was at baseline. She was discharged to home with home health services.  Today's assessment: S: The patient is resting comfortably. No new complaints.  O: Vitals:  Vitals:   03/10/21 0757 03/10/21 1308  BP: (!) 152/64   Pulse: 95   Resp:    Temp: 97.9 F (36.6 C)   SpO2: 95% 92%   # Influenza A pneumonia --cont Tamiflu for a 5-day course   # Acute on chronic hypoxic respiratory failure Chronic hypoxic respiratory failure on 2L O2 baseline --likely 2/2 to flu.  No focal consolidation on CXR.  Covid negative. Hypoxic per EMS requiring 6 L to maintain o2.  --started on  cefepime on admission, since d/c'ed since no strong evidence of bacterial PNA. --currently sating 98-100% on 4L Plan: --cont to tx for flu --Continue supplemental O2 to keep sats >=90%, wean as tolerated   # Severe Sepsis Sepsis by tachycardia, tachypnea, fever, elevated lactate.  --s/p IV boluses   # Pulmonary fibrosis - started on high-dose IV solumedrol 125 mg q12h and scheduled nebs on presentation  --pulm consulted, Dr. Lanney Gins  following Plan: --f/u pulm rec --cont IV solumedrol for now, pending pulm rec - schedule albuterol and prn, scheduled budesonide - home esbriet is not on formulary, she can bring from home   # T2DM Here glucose mildly elevated - hold home metformin --change SSI to resistant scale   # HTN Here borderline hypotensive --hold home metop for now   # CAD Non-obstructive on cath some 10 years ago, neg stress test in 2017. No chest pain. - cont home aspirin, statin   # Gout - home allopurinol   # Stage 2 sacral decubitus ulcer POA.  Small, no signs infection - local wound care   # GERD --cont PPI   # CKD 3a --Cr at baseline     DVT prophylaxis: Lovenox SQ Code Status: Full code  Family Communication:  Level of care: Med-Surg Dispo:   The patient is from: home Anticipated d/c is to: home Anticipated d/c date is: 1-2 days Patient currently is not medically ready to d/c due to: flu, increased O2 requirement, pending pulm consult  Discharge Instructions  Discharge Instructions     Activity as tolerated - No restrictions   Complete by: As directed    Call MD for:  persistant nausea and vomiting   Complete by: As directed    Call MD for:  severe uncontrolled pain   Complete by: As directed    Diet - low sodium heart healthy   Complete by: As directed    Discharge instructions   Complete by: As directed    Discharge to home.  Follow up with Dr. Montel Culver in 2 weeks. Follow up with PCP in 7-10 days.  Continue O2 by nasal congestion. 2L continuous at rest and 4L for activity.   Increase activity slowly   Complete by: As directed       Allergies as of 03/10/2021       Reactions   Morphine And Related Nausea And Vomiting        Medication List     STOP taking these medications    Cinnamon 500 MG capsule   docusate sodium 100 MG capsule Commonly known as: COLACE   furosemide 20 MG tablet Commonly known as: LASIX       TAKE these medications     acetaminophen 325 MG tablet Commonly known as: TYLENOL Take 2 tablets (650 mg total) by mouth every 6 (six) hours as needed (pain).   albuterol 0.63 MG/3ML nebulizer solution Commonly known as: ACCUNEB Take 3 mLs by nebulization 3 (three) times daily. What changed: Another medication with the same name was added. Make sure you understand how and when to take each.   albuterol (2.5 MG/3ML) 0.083% nebulizer solution Commonly known as: PROVENTIL Take 3 mLs (2.5 mg total) by nebulization every 2 (two) hours as needed for wheezing or shortness of breath. What changed: You were already taking a medication with the same name, and this prescription was added. Make sure you understand how and when to take each.   allopurinol 100 MG tablet Commonly known as: ZYLOPRIM Take 100 mg  by mouth daily.   aspirin 81 MG EC tablet Take 1 tablet (81 mg total) by mouth daily. Swallow whole.   budesonide 0.25 MG/2ML nebulizer solution Commonly known as: PULMICORT Take 2 mLs (0.25 mg total) by nebulization 2 (two) times daily.   cholecalciferol 1000 units tablet Commonly known as: VITAMIN D Take 1,000 Units by mouth daily.   Fish Oil 1000 MG Caps Take 1,000 mg by mouth daily.   gabapentin 100 MG capsule Commonly known as: NEURONTIN Take 1 capsule (100 mg total) by mouth 3 (three) times daily. What changed:  medication strength how much to take   Gerhardt's butt cream Crea Apply 1 application topically 3 (three) times daily.   guaiFENesin-dextromethorphan 100-10 MG/5ML syrup Commonly known as: ROBITUSSIN DM Take 5 mLs by mouth every 6 (six) hours as needed for cough.   magnesium oxide 400 MG tablet Commonly known as: MAG-OX Take 1 tablet by mouth daily.   metFORMIN 850 MG tablet Commonly known as: Glucophage Take 1 tablet (850 mg total) by mouth 2 (two) times daily with a meal.   metoprolol succinate 25 MG 24 hr tablet Commonly known as: TOPROL-XL Take 12.5 mg by mouth daily.    omeprazole 20 MG capsule Commonly known as: PRILOSEC Take 20 mg by mouth daily.   Pirfenidone 267 MG Tabs Take 267 mg by mouth 3 (three) times daily.   polyethylene glycol 17 g packet Commonly known as: MIRALAX / GLYCOLAX Take 17 g by mouth daily.   predniSONE 10 MG tablet Commonly known as: DELTASONE Take 1 tablet (10 mg total) by mouth daily. What changed: You were already taking a medication with the same name, and this prescription was added. Make sure you understand how and when to take each.   predniSONE 10 MG tablet Commonly known as: DELTASONE Take 5 tablets (50 mg total) by mouth daily with breakfast for 1 day, THEN 4.5 tablets (45 mg total) daily with breakfast for 1 day, THEN 4 tablets (40 mg total) daily with breakfast for 1 day, THEN 3.5 tablets (35 mg total) daily with breakfast for 1 day, THEN 3 tablets (30 mg total) daily with breakfast for 1 day, THEN 2.5 tablets (25 mg total) daily with breakfast for 1 day, THEN 2 tablets (20 mg total) daily with breakfast for 1 day, THEN 1.5 tablets (15 mg total) daily with breakfast for 1 day, THEN 1 tablet (10 mg total) daily with breakfast for 1 day, THEN 0.5 tablets (5 mg total) daily with breakfast for 1 day. Start taking on: March 10, 2021 What changed: See the new instructions.   rosuvastatin 40 MG tablet Commonly known as: CRESTOR Take 40 mg by mouth daily.   sodium chloride 0.65 % Soln nasal spray Commonly known as: OCEAN Place 2 sprays into both nostrils as needed for congestion.   Vitamin D (Ergocalciferol) 1.25 MG (50000 UNIT) Caps capsule Commonly known as: DRISDOL Take 1 capsule (50,000 Units total) by mouth every 7 (seven) days.       ASK your doctor about these medications    oseltamivir 30 MG capsule Commonly known as: TAMIFLU Take 1 capsule (30 mg total) by mouth 2 (two) times daily for 3 days. Ask about: Should I take this medication?       Allergies  Allergen Reactions   Morphine And Related  Nausea And Vomiting    The results of significant diagnostics from this hospitalization (including imaging, microbiology, ancillary and laboratory) are listed below for reference.  Significant Diagnostic Studies: DG Chest Port 1 View  Result Date: 03/10/2021 CLINICAL DATA:  Hypoxia EXAM: PORTABLE CHEST 1 VIEW COMPARISON:  Chest radiograph 03/07/2021 FINDINGS: The cardiomediastinal silhouette is stable. Coarsened interstitial markings are again seen throughout both lungs consistent with interstitial lung disease as seen on prior CT chest. Aeration of the right lung may be slightly improved in the interim. There is no pneumothorax. IMPRESSION: Possible slight improvement in aeration in the right lung superimposed on a background of chronic interstitial lung disease which may reflect resolving edema or infection. No new or worsening focal airspace disease. Electronically Signed   By: Valetta Mole M.D.   On: 03/10/2021 14:22   DG Chest Port 1 View  Result Date: 03/07/2021 CLINICAL DATA:  Or shortness of breath.  Question sepsis. EXAM: PORTABLE CHEST 1 VIEW COMPARISON:  01/11/2021.  12/27/2020. FINDINGS: Heart size is unchanged. Aortic tortuosity as seen previously. Chronic interstitial lung disease as seen previously. Pulmonary opacity appears slightly more pronounced in there could be an element of superimposed edema or pneumonia. No dense consolidation or collapse. IMPRESSION: Background pattern of chronic interstitial lung disease. Pulmonary density appears slightly more pronounced, indicating that there could be an element of superimposed acute edema or infiltrate. No dense consolidation. Electronically Signed   By: Nelson Chimes M.D.   On: 03/07/2021 11:48    Microbiology: Recent Results (from the past 240 hour(s))  Resp Panel by RT-PCR (Flu A&B, Covid) Nasopharyngeal Swab     Status: Abnormal   Collection Time: 03/07/21 11:24 AM   Specimen: Nasopharyngeal Swab; Nasopharyngeal(NP) swabs in vial  transport medium  Result Value Ref Range Status   SARS Coronavirus 2 by RT PCR NEGATIVE NEGATIVE Final    Comment: (NOTE) SARS-CoV-2 target nucleic acids are NOT DETECTED.  The SARS-CoV-2 RNA is generally detectable in upper respiratory specimens during the acute phase of infection. The lowest concentration of SARS-CoV-2 viral copies this assay can detect is 138 copies/mL. A negative result does not preclude SARS-Cov-2 infection and should not be used as the sole basis for treatment or other patient management decisions. A negative result may occur with  improper specimen collection/handling, submission of specimen other than nasopharyngeal swab, presence of viral mutation(s) within the areas targeted by this assay, and inadequate number of viral copies(<138 copies/mL). A negative result must be combined with clinical observations, patient history, and epidemiological information. The expected result is Negative.  Fact Sheet for Patients:  EntrepreneurPulse.com.au  Fact Sheet for Healthcare Providers:  IncredibleEmployment.be  This test is no t yet approved or cleared by the [place] FDA and  has been authorized for detection and/or diagnosis of SARS-CoV-2 by FDA under an Emergency Use Authorization (EUA). This EUA will remain  in effect (meaning this test can be used) for the duration of the COVID-19 declaration under Section 564(b)(1) of the Act, 21 U.S.C.section 360bbb-3(b)(1), unless the authorization is terminated  or revoked sooner.       Influenza A by PCR POSITIVE (A) NEGATIVE Final   Influenza B by PCR NEGATIVE NEGATIVE Final    Comment: (NOTE) The Xpert Xpress SARS-CoV-2/FLU/RSV plus assay is intended as an aid in the diagnosis of influenza from Nasopharyngeal swab specimens and should not be used as a sole basis for treatment. Nasal washings and aspirates are unacceptable for Xpert Xpress SARS-CoV-2/FLU/RSV testing.  Fact  Sheet for Patients: EntrepreneurPulse.com.au  Fact Sheet for Healthcare Providers: IncredibleEmployment.be  This test is not yet approved or cleared by the [place] FDA and has  been authorized for detection and/or diagnosis of SARS-CoV-2 by FDA under an Emergency Use Authorization (EUA). This EUA will remain in effect (meaning this test can be used) for the duration of the COVID-19 declaration under Section 564(b)(1) of the Act, 21 U.S.C. section 360bbb-3(b)(1), unless the authorization is terminated or revoked.  Performed at [facility], [address] [place], [place] [address]   Blood Culture (routine x 2)     Status: None   Collection Time: 03/07/21 11:24 AM   Specimen: BLOOD  Result Value Ref Range Status   Specimen Description BLOOD RIGHT ANTECUBITAL  Final   Special Requests   Final    BOTTLES DRAWN AEROBIC AND ANAEROBIC Blood Culture adequate volume   Culture   Final    NO GROWTH 5 DAYS Performed at [facility] [place] [facility], [address] [place], [place] [address]    Report Status 03/12/2021 FINAL  Final  Blood Culture (routine x 2)     Status: None   Collection Time: 03/07/21 11:24 AM   Specimen: BLOOD  Result Value Ref Range Status   Specimen Description BLOOD BLOOD LEFT HAND  Final   Special Requests   Final    BOTTLES DRAWN AEROBIC AND ANAEROBIC Blood Culture results may not be optimal due to an inadequate volume of blood received in culture bottles   Culture   Final    NO GROWTH 5 DAYS Performed at [facility], [address] [place], [place] [address]    Report Status 03/12/2021 FINAL  Final  Urine Culture     Status: None   Collection Time: 03/07/21  3:44 PM   Specimen: In/Out Cath Urine  Result Value Ref Range Status   Specimen Description   Final    IN/OUT CATH URINE Performed at [facility], [address] [place], [place] [address]    Special Requests   Final     NONE Performed at [facility], [address], [place], [place] [address]    Culture   Final    NO GROWTH Performed at [facility], [place] [address], [place], [place] [address]    Report Status 03/08/2021 FINAL  Final  MRSA Next Gen by PCR, Nasal     Status: None   Collection Time: 03/07/21  4:47 PM   Specimen: Nasal Mucosa; Nasal Swab  Result Value Ref Range Status   MRSA by PCR Next Gen NOT DETECTED NOT DETECTED Final    Comment: (NOTE) The GeneXpert MRSA Assay (FDA approved for NASAL specimens only), is one component of a comprehensive MRSA colonization surveillance program. It is not intended to diagnose MRSA infection nor to guide or monitor treatment for MRSA infections. Test performance is not FDA approved in patients less than 30 years old. Performed at [facility], [place], [place] [address]   Expectorated Sputum Assessment w Gram Stain, Rflx to Resp Cult     Status: None   Collection Time: 03/09/21  9:25 PM   Specimen: Expectorated Sputum  Result Value Ref Range Status   Specimen Description EXPECTORATED SPUTUM  Final   Special Requests NONE  Final   Sputum evaluation   Final    Sputum specimen not acceptable for testing.  Please recollect.   C/MARCELA TURNER@2342  03/09/21 RH Performed at [facility], [address], [place],  [address]    Report Status 03/09/2021 FINAL  Final  Expectorated Sputum Assessment w Gram Stain, Rflx to Resp Cult  Status: None   Collection Time: 03/10/21 11:14 AM   Specimen: SPU  Result Value Ref Range Status   Specimen Description SPUTUM  Final   Special Requests NONE  Final   Sputum evaluation   Final    Sputum specimen not acceptable for testing.  Please recollect.   C/ MARIAM MITCHELL 1431  03/10/21 MU Performed at [facility], [address], [place], [place] [address]    Report Status 03/10/2021 FINAL  Final     Labs: Basic Metabolic Panel: Recent Labs   Lab 03/09/21 0446 03/10/21 0419  NA 137 138  K 3.8 3.4*  CL 109 109  CO2 23 25  GLUCOSE 163* 137*  BUN 19 19  CREATININE 1.16* 1.10*  CALCIUM 8.9 8.9  MG 1.7 1.9   Liver Function Tests: No results for input(s): AST, ALT, ALKPHOS, BILITOT, PROT, ALBUMIN in the last 168 hours. No results for input(s): LIPASE, AMYLASE in the last 168 hours. No results for input(s): AMMONIA in the last 168 hours. CBC: Recent Labs  Lab 03/09/21 0446 03/10/21 0419  WBC 10.9* 10.2  HGB 9.0* 9.6*  HCT 28.2* 30.2*  MCV 98.6 97.7  PLT 176 198   Cardiac Enzymes: No results for input(s): CKTOTAL, CKMB, CKMBINDEX, TROPONINI in the last 168 hours. BNP: BNP (last 3 results) Recent Labs    12/27/20 1813 01/12/21 0000 03/08/21 0648  BNP 32.1 1,114.8* 304.3*    ProBNP (last 3 results) No results for input(s): PROBNP in the last 8760 hours.  CBG: Recent Labs  Lab 03/09/21 1657 03/09/21 2057 03/10/21 0755 03/10/21 1144 03/10/21 1605  GLUCAP 150* 215* 177* 223* 117*    Principal Problem:   Influenza A with pneumonia Active Problems:   Coronary artery disease   Diabetes type 2, controlled (HCC)   Hypertension   Chronic diastolic CHF (congestive heart failure) (HCC)   IPF (idiopathic pulmonary fibrosis) (HCC)   Sacral decubitus ulcer   Pressure injury of skin   Sepsis ([place])   Acute on chronic respiratory failure with hypoxemia ([place])   Time coordinating discharge: 38 minutes.  Signed:        Yarethzy Croak, DO [facility]  03/15/2021, 5:36 PM

## 2021-04-27 ENCOUNTER — Ambulatory Visit: Payer: Self-pay | Admitting: *Deleted

## 2021-04-27 ENCOUNTER — Telehealth: Payer: Self-pay | Admitting: *Deleted

## 2021-04-27 NOTE — Telephone Encounter (Signed)
Pt requesting covid booster. Appt scheduled

## 2021-04-28 ENCOUNTER — Ambulatory Visit: Payer: Medicare HMO

## 2021-04-28 ENCOUNTER — Ambulatory Visit: Payer: Medicare HMO | Attending: Internal Medicine

## 2021-04-28 ENCOUNTER — Other Ambulatory Visit: Payer: Self-pay

## 2021-04-28 DIAGNOSIS — Z23 Encounter for immunization: Secondary | ICD-10-CM

## 2021-04-28 MED ORDER — PFIZER COVID-19 VAC BIVALENT 30 MCG/0.3ML IM SUSP
INTRAMUSCULAR | 0 refills | Status: DC
  Filled 2021-04-28: qty 0.3, 1d supply, fill #0

## 2021-04-28 NOTE — Progress Notes (Signed)
° °  Covid-19 Vaccination Clinic  Name:  Sierra Guzman    MRN: [id number] DOB: Sep 25, 1940  04/28/2021  Ms. Jurica was observed post Covid-19 immunization for 15 minutes without incident. She was provided with Vaccine Information Sheet and instruction to access the V-Safe system.   Ms. Linen was instructed to call 911 with any severe reactions post vaccine: Difficulty breathing  Swelling of face and throat  A fast heartbeat  A bad rash all over body  Dizziness and weakness   Immunizations Administered     Name Date Dose VIS Date Route   Pfizer Covid-19 Vaccine Bivalent Booster 04/28/2021  2:25 PM 0.3 mL 12/08/2020 Intramuscular   Manufacturer: [employer]   Lot: [id number]   [place]: [id number]

## 2021-05-09 ENCOUNTER — Emergency Department: Payer: Medicare HMO

## 2021-05-09 ENCOUNTER — Other Ambulatory Visit: Payer: Self-pay

## 2021-05-09 ENCOUNTER — Emergency Department
Admission: EM | Admit: 2021-05-09 | Discharge: 2021-05-09 | Disposition: A | Discharge status: Home / Self Care | Payer: Medicare HMO | Attending: Emergency Medicine | Admitting: Emergency Medicine

## 2021-05-09 DIAGNOSIS — R350 Frequency of micturition: Secondary | ICD-10-CM | POA: Diagnosis not present

## 2021-05-09 DIAGNOSIS — R0602 Shortness of breath: Secondary | ICD-10-CM | POA: Diagnosis present

## 2021-05-09 DIAGNOSIS — R000 Tachycardia, unspecified: Secondary | ICD-10-CM | POA: Diagnosis not present

## 2021-05-09 DIAGNOSIS — D72829 Elevated white blood cell count, unspecified: Secondary | ICD-10-CM | POA: Insufficient documentation

## 2021-05-09 DIAGNOSIS — Z20822 Contact with and (suspected) exposure to covid-19: Secondary | ICD-10-CM | POA: Insufficient documentation

## 2021-05-09 LAB — RESP PANEL BY RT-PCR (FLU A&B, COVID) ARPGX2
Influenza A by PCR: NEGATIVE
Influenza B by PCR: NEGATIVE
SARS Coronavirus 2 by RT PCR: NEGATIVE

## 2021-05-09 LAB — MAGNESIUM: Magnesium: 1.8 mg/dL (ref 1.7–2.4)

## 2021-05-09 LAB — CBC
HCT: 34.7 % — ABNORMAL LOW (ref 36.0–46.0)
Hemoglobin: 10.7 g/dL — ABNORMAL LOW (ref 12.0–15.0)
MCH: 31.8 pg (ref 26.0–34.0)
MCHC: 30.8 g/dL (ref 30.0–36.0)
MCV: 103.3 fL — ABNORMAL HIGH (ref 80.0–100.0)
Platelets: 240 10*3/uL (ref 150–400)
RBC: 3.36 MIL/uL — ABNORMAL LOW (ref 3.87–5.11)
RDW: 13.9 % (ref 11.5–15.5)
WBC: 12.4 10*3/uL — ABNORMAL HIGH (ref 4.0–10.5)
nRBC: 0 % (ref 0.0–0.2)

## 2021-05-09 LAB — BASIC METABOLIC PANEL
Anion gap: 10 (ref 5–15)
BUN: 19 mg/dL (ref 8–23)
CO2: 27 mmol/L (ref 22–32)
Calcium: 9.9 mg/dL (ref 8.9–10.3)
Chloride: 103 mmol/L (ref 98–111)
Creatinine, Ser: 1.01 mg/dL — ABNORMAL HIGH (ref 0.44–1.00)
GFR, Estimated: 56 mL/min — ABNORMAL LOW (ref >60)
Glucose, Bld: 96 mg/dL (ref 70–99)
Potassium: 4 mmol/L (ref 3.5–5.1)
Sodium: 140 mmol/L (ref 135–145)

## 2021-05-09 LAB — URINALYSIS, ROUTINE W REFLEX MICROSCOPIC
Bilirubin Urine: NEGATIVE
Glucose, UA: NEGATIVE mg/dL
Ketones, ur: NEGATIVE mg/dL
Leukocytes,Ua: NEGATIVE
Nitrite: NEGATIVE
Protein, ur: NEGATIVE mg/dL
Specific Gravity, Urine: 1.015 (ref 1.005–1.030)
pH: 7 (ref 5.0–8.0)

## 2021-05-09 LAB — BRAIN NATRIURETIC PEPTIDE: B Natriuretic Peptide: 234.9 pg/mL — ABNORMAL HIGH (ref 0.0–100.0)

## 2021-05-09 LAB — T4, FREE: Free T4: 0.94 ng/dL (ref 0.61–1.12)

## 2021-05-09 LAB — URINALYSIS, MICROSCOPIC (REFLEX)

## 2021-05-09 LAB — TROPONIN I (HIGH SENSITIVITY)
Troponin I (High Sensitivity): 28 ng/L — ABNORMAL HIGH (ref <18)
Troponin I (High Sensitivity): 34 ng/L — ABNORMAL HIGH (ref <18)

## 2021-05-09 LAB — TSH: TSH: 1.12 u[IU]/mL (ref 0.350–4.500)

## 2021-05-09 MED ORDER — METOPROLOL SUCCINATE ER 25 MG PO TB24
12.5000 mg | ORAL_TABLET | Freq: Once | ORAL | Status: AC
Start: 2021-05-09 — End: 2021-05-09
  Administered 2021-05-09: 12.5 mg via ORAL
  Filled 2021-05-09 (×2): qty 0.5

## 2021-05-09 MED ORDER — METOPROLOL SUCCINATE ER 25 MG PO TB24
12.5000 mg | ORAL_TABLET | Freq: Once | ORAL | Status: AC
  Administered 2021-05-09: 12.5 mg via ORAL
  Filled 2021-05-09 (×2): qty 0.5

## 2021-05-09 NOTE — ED Triage Notes (Signed)
Pt here via [facility] from home tachycardia and urinary incontinence. Pt has a hx of a fib and pulmonary fibrosis. Pt HR was 130-160 with ems and was given 2.5mg  of metoprolol to bring her down to 110 which is her normal. Pt wears 2L of xygen at home. Pt denies pain, SOB, or weakness but has not been eating and drinking lately. 150cc of fluid given by ems. \   98.4 110 HR 98% on 4L

## 2021-05-09 NOTE — ED Notes (Signed)
Called pharmacy to send XL metoprolol. Not loaded in pyxis.

## 2021-05-09 NOTE — Discharge Instructions (Addendum)
Is difficult to tell if your heart rate is in atrial fibrillation or just in sinus with occasional early beats.  However I feel like regardless we should increase your metoprolol to 25 mg.  We will hold off on any blood thinners due to the risk.   If you notice her heart rates going low like less than 60 or her blood pressure is going to lower her feeling more dizzy or lightheaded with the increase in dosing go back down to the 12.5   I have also given give urology's number to follow-up with outpatient for your increased frequency

## 2021-05-09 NOTE — ED Provider Notes (Signed)
[facility] Provider Note    Event Date/Time   First MD Initiated Contact with Patient 05/09/21 [phone]     (approximate)   History   Tachycardia and Urinary Frequency   HPI  Sierra Guzman is a 81 y.o. female with atrial fibrillation, pulmonary fibrosis who comes in with tachycardia.  Patient reports having heart rates between 1 30-160 with EMS.  Patient was given 2.5 IV metoprolol and heart rates have come down to 110 which is more like her normal.  Patient  initially made some mention a history of atrial fibrillation but I reviewed her chart and her discharge summary from 03/2021 I do not see any signs of patient has had a history of A. fib.  However she does report that she is on metoprolol I did review the records and she is on 12.5.  She reports that she has not taken her medications this morning.  She states that her bigger concern was the increased urinary frequency.  Denies any abdominal pain, back pain.  Reports that her breathing is at its baseline.  Denies any chest pain.  She is on 2 L-3L at rest and uses 4 L with exertion.  Physical Exam   Triage Vital Signs: ED Triage Vitals  Enc Vitals Group     BP 05/09/21 0949 (!) 142/107     Pulse Rate 05/09/21 0949 (!) 111     Resp 05/09/21 0949 19     Temp 05/09/21 0949 98.1 F (36.7 C)     Temp Source 05/09/21 0949 Oral     SpO2 05/09/21 0949 99 %     Weight 05/09/21 0947 150 lb 9.2 oz (68.3 kg)     Height 05/09/21 0947 5\' 7"  (1.702 m)     Head Circumference --      Peak Flow --      Pain Score 05/09/21 0947 0     Pain Loc --      Pain Edu? --      Excl. in [place]? --     Most recent vital signs: Vitals:   05/09/21 0949  BP: (!) 142/107  Pulse: (!) 111  Resp: 19  Temp: 98.1 F (36.7 C)  SpO2: 99%     General: Awake, no distress. Well appearing  CV:  Good peripheral perfusion. Irregular heart beat  Resp:  Normal effort. Clear lungs  Abd:  No distention. Non tender  Other:  No swelling, calf  tenderness    ED Results / Procedures / Treatments   Labs (all labs ordered are listed, but only abnormal results are displayed) Labs Reviewed  BASIC METABOLIC PANEL - Abnormal; Notable for the following components:      Result Value   Creatinine, Ser 1.01 (*)    GFR, Estimated 56 (*)    All other components within normal limits  CBC - Abnormal; Notable for the following components:   WBC 12.4 (*)    RBC 3.36 (*)    Hemoglobin 10.7 (*)    HCT 34.7 (*)    MCV 103.3 (*)    All other components within normal limits  BRAIN NATRIURETIC PEPTIDE - Abnormal; Notable for the following components:   B Natriuretic Peptide 234.9 (*)    All other components within normal limits  URINALYSIS, ROUTINE W REFLEX MICROSCOPIC - Abnormal; Notable for the following components:   Color, Urine STRAW (*)    Hgb urine dipstick TRACE (*)    All other components within normal limits  URINALYSIS, MICROSCOPIC (REFLEX) - Abnormal; Notable for the following components:   Bacteria, UA RARE (*)    All other components within normal limits  TROPONIN I (HIGH SENSITIVITY) - Abnormal; Notable for the following components:   Troponin I (High Sensitivity) 28 (*)    All other components within normal limits  RESP PANEL BY RT-PCR (FLU A&B, COVID) ARPGX2  URINE CULTURE  MAGNESIUM  TSH  T4, FREE  TROPONIN I (HIGH SENSITIVITY)     EKG  My interpretation of EKG:  Very difficult to interpret due to very wavy baseline so we will get a repeat  Does occasionally have some P waves so not sure if this is sinus with some PACs versus atrial fibrillation, no ST elevation or T wave inversions except for lead III  RADIOLOGY I have reviewed the xray personally and agree with radiology read with chronic interstitial lung disease   PROCEDURES:  Critical Care performed: No  .1-3 Lead EKG Interpretation Performed by: Vanessa [place], MD Authorized by: Vanessa [place], MD     Interpretation: abnormal     ECG rate:   100s   ECG rate assessment: tachycardic     Rhythm: atrial fibrillation     Ectopy: PVCs     Conduction: normal     MEDICATIONS ORDERED IN ED: Medications  metoprolol succinate (TOPROL-XL) 24 hr tablet 12.5 mg (has no administration in time range)  metoprolol succinate (TOPROL-XL) 24 hr tablet 12.5 mg (12.5 mg Oral Given 05/09/21 1157)     IMPRESSION / MDM / ASSESSMENT AND PLAN / ED COURSE  I reviewed the triage vital signs and the nursing notes.   Differential diagnosis includes, but is not limited to, UTI, urinary retention we will get bladder scan.  Given it appears that patient is never been formally diagnosed with A. fib will get labs to evaluate for electrolyte abnormalities, thyroid dysfunction that could be causing it.  On the monitor looks more like atrial fibrillation but on her EKGs it is reading it as sinus and she does occasionally have some P waves so not sure if she is just sinus with some PACs.  11:48 AM reevaluated patient and th daughter is now at bedside he states that she had a lot of anxiety and she feels like she was just getting anxious today because she could not get to the bathroom in time and that this new frequent urination is new for her.  I mentioned that I not see if it was listed in her chart they stated that they have never have not formally diagnosed but she has been at cardiology previously and they have thought that she has had A. fib.  She has not taken her home metoprolol yet and daughter did confirm it was 12.5  UA without signs of UTI COVID, flu are negative Initial troponin slightly elevated but will get repeat CMP shows kidney function at baseline potassium is normal CBC shows slightly elevated white count but hemoglobin is stable BNP is around patient's baseline  Repeat troponin went from 28-34 she denies any chest pain or worsening shortness of breath.  Suspect this is just demand from the tachycardia, A. fib episode.  There is concern for  potentially some urinary retention but patient was able to urinate and had a bladder scan that only showed 100 cc residual.  Not sure was causing her the increased frequency but have her follow-up for urology in case of some pelvic floor dysfunction.  Reevaluate patient she feels comfortable  with going home she denies any worsening symptoms at this time.  We will not increase her metoprolol to help with her heart rates.   The patient is on the cardiac monitor to evaluate for evidence of arrhythmia and/or significant heart rate changes.    FINAL CLINICAL IMPRESSION(S) / ED DIAGNOSES   Final diagnoses:  Urinary frequency  Tachycardia     Rx / DC Orders   ED Discharge Orders     None        Note:  This document was prepared using Dragon voice recognition software and may include unintentional dictation errors.   Vanessa [place], MD 05/09/21 [phone]

## 2021-05-09 NOTE — ED Notes (Signed)
Lab called and coming to get 2nd trop

## 2021-05-10 LAB — URINE CULTURE: Culture: <10000 — AB

## 2021-06-10 ENCOUNTER — Other Ambulatory Visit: Payer: Self-pay

## 2021-06-10 ENCOUNTER — Encounter: Payer: Self-pay | Admitting: Emergency Medicine

## 2021-06-10 ENCOUNTER — Emergency Department: Payer: Medicare HMO

## 2021-06-10 ENCOUNTER — Inpatient Hospital Stay
Admission: EM | Admit: 2021-06-10 | Discharge: 2021-06-17 | DRG: 189 | Disposition: A | Discharge status: Hospice | Payer: Medicare HMO | Attending: Internal Medicine | Admitting: Internal Medicine

## 2021-06-10 DIAGNOSIS — I959 Hypotension, unspecified: Secondary | ICD-10-CM | POA: Clinically undetermined

## 2021-06-10 DIAGNOSIS — Z79899 Other long term (current) drug therapy: Secondary | ICD-10-CM

## 2021-06-10 DIAGNOSIS — N179 Acute kidney failure, unspecified: Secondary | ICD-10-CM | POA: Diagnosis present

## 2021-06-10 DIAGNOSIS — J9621 Acute and chronic respiratory failure with hypoxia: Secondary | ICD-10-CM | POA: Diagnosis present

## 2021-06-10 DIAGNOSIS — I130 Hypertensive heart and chronic kidney disease with heart failure and stage 1 through stage 4 chronic kidney disease, or unspecified chronic kidney disease: Secondary | ICD-10-CM | POA: Diagnosis present

## 2021-06-10 DIAGNOSIS — N183 Chronic kidney disease, stage 3 unspecified: Secondary | ICD-10-CM | POA: Diagnosis present

## 2021-06-10 DIAGNOSIS — I4719 Other supraventricular tachycardia: Secondary | ICD-10-CM | POA: Diagnosis present

## 2021-06-10 DIAGNOSIS — I248 Other forms of acute ischemic heart disease: Secondary | ICD-10-CM | POA: Diagnosis present

## 2021-06-10 DIAGNOSIS — L89152 Pressure ulcer of sacral region, stage 2: Secondary | ICD-10-CM | POA: Diagnosis present

## 2021-06-10 DIAGNOSIS — I4891 Unspecified atrial fibrillation: Secondary | ICD-10-CM | POA: Diagnosis not present

## 2021-06-10 DIAGNOSIS — J84112 Idiopathic pulmonary fibrosis: Secondary | ICD-10-CM | POA: Diagnosis present

## 2021-06-10 DIAGNOSIS — E785 Hyperlipidemia, unspecified: Secondary | ICD-10-CM | POA: Diagnosis present

## 2021-06-10 DIAGNOSIS — E1122 Type 2 diabetes mellitus with diabetic chronic kidney disease: Secondary | ICD-10-CM | POA: Diagnosis present

## 2021-06-10 DIAGNOSIS — I2510 Atherosclerotic heart disease of native coronary artery without angina pectoris: Secondary | ICD-10-CM | POA: Diagnosis present

## 2021-06-10 DIAGNOSIS — F410 Panic disorder [episodic paroxysmal anxiety] without agoraphobia: Secondary | ICD-10-CM | POA: Diagnosis present

## 2021-06-10 DIAGNOSIS — R7989 Other specified abnormal findings of blood chemistry: Secondary | ICD-10-CM | POA: Diagnosis not present

## 2021-06-10 DIAGNOSIS — I471 Supraventricular tachycardia: Secondary | ICD-10-CM | POA: Diagnosis present

## 2021-06-10 DIAGNOSIS — I272 Pulmonary hypertension, unspecified: Secondary | ICD-10-CM | POA: Diagnosis present

## 2021-06-10 DIAGNOSIS — D631 Anemia in chronic kidney disease: Secondary | ICD-10-CM | POA: Diagnosis present

## 2021-06-10 DIAGNOSIS — Z20822 Contact with and (suspected) exposure to covid-19: Secondary | ICD-10-CM | POA: Diagnosis present

## 2021-06-10 DIAGNOSIS — Z7984 Long term (current) use of oral hypoglycemic drugs: Secondary | ICD-10-CM

## 2021-06-10 DIAGNOSIS — J9600 Acute respiratory failure, unspecified whether with hypoxia or hypercapnia: Secondary | ICD-10-CM | POA: Diagnosis not present

## 2021-06-10 DIAGNOSIS — A419 Sepsis, unspecified organism: Secondary | ICD-10-CM | POA: Diagnosis present

## 2021-06-10 DIAGNOSIS — J9620 Acute and chronic respiratory failure, unspecified whether with hypoxia or hypercapnia: Secondary | ICD-10-CM | POA: Insufficient documentation

## 2021-06-10 DIAGNOSIS — E119 Type 2 diabetes mellitus without complications: Secondary | ICD-10-CM

## 2021-06-10 DIAGNOSIS — E1165 Type 2 diabetes mellitus with hyperglycemia: Secondary | ICD-10-CM | POA: Diagnosis present

## 2021-06-10 DIAGNOSIS — I429 Cardiomyopathy, unspecified: Secondary | ICD-10-CM | POA: Diagnosis present

## 2021-06-10 DIAGNOSIS — R008 Other abnormalities of heart beat: Secondary | ICD-10-CM | POA: Diagnosis not present

## 2021-06-10 DIAGNOSIS — N1831 Chronic kidney disease, stage 3a: Secondary | ICD-10-CM | POA: Diagnosis present

## 2021-06-10 DIAGNOSIS — I5043 Acute on chronic combined systolic (congestive) and diastolic (congestive) heart failure: Secondary | ICD-10-CM | POA: Diagnosis present

## 2021-06-10 DIAGNOSIS — Z96643 Presence of artificial hip joint, bilateral: Secondary | ICD-10-CM | POA: Diagnosis present

## 2021-06-10 DIAGNOSIS — J849 Interstitial pulmonary disease, unspecified: Secondary | ICD-10-CM | POA: Diagnosis present

## 2021-06-10 DIAGNOSIS — E440 Moderate protein-calorie malnutrition: Secondary | ICD-10-CM | POA: Diagnosis present

## 2021-06-10 DIAGNOSIS — E559 Vitamin D deficiency, unspecified: Secondary | ICD-10-CM | POA: Diagnosis present

## 2021-06-10 DIAGNOSIS — Z9981 Dependence on supplemental oxygen: Secondary | ICD-10-CM | POA: Diagnosis not present

## 2021-06-10 DIAGNOSIS — Z803 Family history of malignant neoplasm of breast: Secondary | ICD-10-CM

## 2021-06-10 DIAGNOSIS — K219 Gastro-esophageal reflux disease without esophagitis: Secondary | ICD-10-CM | POA: Diagnosis present

## 2021-06-10 DIAGNOSIS — Z833 Family history of diabetes mellitus: Secondary | ICD-10-CM

## 2021-06-10 DIAGNOSIS — J45909 Unspecified asthma, uncomplicated: Secondary | ICD-10-CM | POA: Diagnosis present

## 2021-06-10 DIAGNOSIS — Z823 Family history of stroke: Secondary | ICD-10-CM

## 2021-06-10 DIAGNOSIS — I10 Essential (primary) hypertension: Secondary | ICD-10-CM | POA: Diagnosis present

## 2021-06-10 DIAGNOSIS — Z96653 Presence of artificial knee joint, bilateral: Secondary | ICD-10-CM | POA: Diagnosis present

## 2021-06-10 DIAGNOSIS — E538 Deficiency of other specified B group vitamins: Secondary | ICD-10-CM | POA: Diagnosis present

## 2021-06-10 DIAGNOSIS — L8990 Pressure ulcer of unspecified site, unspecified stage: Secondary | ICD-10-CM | POA: Diagnosis present

## 2021-06-10 DIAGNOSIS — Z7951 Long term (current) use of inhaled steroids: Secondary | ICD-10-CM

## 2021-06-10 DIAGNOSIS — L89302 Pressure ulcer of unspecified buttock, stage 2: Secondary | ICD-10-CM | POA: Diagnosis not present

## 2021-06-10 DIAGNOSIS — Z7982 Long term (current) use of aspirin: Secondary | ICD-10-CM

## 2021-06-10 DIAGNOSIS — M109 Gout, unspecified: Secondary | ICD-10-CM | POA: Diagnosis present

## 2021-06-10 DIAGNOSIS — Z515 Encounter for palliative care: Secondary | ICD-10-CM | POA: Diagnosis not present

## 2021-06-10 DIAGNOSIS — Z7189 Other specified counseling: Secondary | ICD-10-CM | POA: Diagnosis not present

## 2021-06-10 DIAGNOSIS — Z87891 Personal history of nicotine dependence: Secondary | ICD-10-CM

## 2021-06-10 DIAGNOSIS — D649 Anemia, unspecified: Secondary | ICD-10-CM | POA: Diagnosis present

## 2021-06-10 DIAGNOSIS — D638 Anemia in other chronic diseases classified elsewhere: Secondary | ICD-10-CM | POA: Diagnosis not present

## 2021-06-10 DIAGNOSIS — I5020 Unspecified systolic (congestive) heart failure: Secondary | ICD-10-CM

## 2021-06-10 DIAGNOSIS — I5021 Acute systolic (congestive) heart failure: Secondary | ICD-10-CM | POA: Diagnosis not present

## 2021-06-10 DIAGNOSIS — Z7952 Long term (current) use of systemic steroids: Secondary | ICD-10-CM

## 2021-06-10 HISTORY — DX: Chronic combined systolic (congestive) and diastolic (congestive) heart failure: I50.42

## 2021-06-10 LAB — LACTIC ACID, PLASMA
Lactic Acid, Venous: 2.7 mmol/L (ref 0.5–1.9)
Lactic Acid, Venous: 1.8 mmol/L (ref 0.5–1.9)

## 2021-06-10 LAB — CBC WITH DIFFERENTIAL/PLATELET
Abs Immature Granulocytes: 0.04 10*3/uL (ref 0.00–0.07)
Basophils Absolute: 0 10*3/uL (ref 0.0–0.1)
Basophils Relative: 0 %
Eosinophils Absolute: 0 10*3/uL (ref 0.0–0.5)
Eosinophils Relative: 0 %
HCT: 30.4 % — ABNORMAL LOW (ref 36.0–46.0)
Hemoglobin: 9.2 g/dL — ABNORMAL LOW (ref 12.0–15.0)
Immature Granulocytes: 0 %
Lymphocytes Relative: 11 %
Lymphs Abs: 1 10*3/uL (ref 0.7–4.0)
MCH: 31.2 pg (ref 26.0–34.0)
MCHC: 30.3 g/dL (ref 30.0–36.0)
MCV: 103.1 fL — ABNORMAL HIGH (ref 80.0–100.0)
Monocytes Absolute: 0.6 10*3/uL (ref 0.1–1.0)
Monocytes Relative: 6 %
Neutro Abs: 7.3 10*3/uL (ref 1.7–7.7)
Neutrophils Relative %: 83 %
Platelets: 241 10*3/uL (ref 150–400)
RBC: 2.95 MIL/uL — ABNORMAL LOW (ref 3.87–5.11)
RDW: 14.5 % (ref 11.5–15.5)
WBC: 8.9 10*3/uL (ref 4.0–10.5)
nRBC: 0 % (ref 0.0–0.2)

## 2021-06-10 LAB — COMPREHENSIVE METABOLIC PANEL
ALT: 46 U/L — ABNORMAL HIGH (ref 0–44)
AST: 81 U/L — ABNORMAL HIGH (ref 15–41)
Albumin: 2.9 g/dL — ABNORMAL LOW (ref 3.5–5.0)
Alkaline Phosphatase: 47 U/L (ref 38–126)
Anion gap: 7 (ref 5–15)
BUN: 26 mg/dL — ABNORMAL HIGH (ref 8–23)
CO2: 30 mmol/L (ref 22–32)
Calcium: 9.3 mg/dL (ref 8.9–10.3)
Chloride: 106 mmol/L (ref 98–111)
Creatinine, Ser: 1.04 mg/dL — ABNORMAL HIGH (ref 0.44–1.00)
GFR, Estimated: 54 mL/min — ABNORMAL LOW (ref >60)
Glucose, Bld: 216 mg/dL — ABNORMAL HIGH (ref 70–99)
Potassium: 5 mmol/L (ref 3.5–5.1)
Sodium: 143 mmol/L (ref 135–145)
Total Bilirubin: 1 mg/dL (ref 0.3–1.2)
Total Protein: 6.3 g/dL — ABNORMAL LOW (ref 6.5–8.1)

## 2021-06-10 LAB — RESP PANEL BY RT-PCR (FLU A&B, COVID) ARPGX2
Influenza A by PCR: NEGATIVE
Influenza B by PCR: NEGATIVE
SARS Coronavirus 2 by RT PCR: NEGATIVE

## 2021-06-10 LAB — CBC
HCT: 30 % — ABNORMAL LOW (ref 36.0–46.0)
Hemoglobin: 9.1 g/dL — ABNORMAL LOW (ref 12.0–15.0)
MCH: 31.5 pg (ref 26.0–34.0)
MCHC: 30.3 g/dL (ref 30.0–36.0)
MCV: 103.8 fL — ABNORMAL HIGH (ref 80.0–100.0)
Platelets: 211 10*3/uL (ref 150–400)
RBC: 2.89 MIL/uL — ABNORMAL LOW (ref 3.87–5.11)
RDW: 14.5 % (ref 11.5–15.5)
WBC: 11 10*3/uL — ABNORMAL HIGH (ref 4.0–10.5)
nRBC: 0 % (ref 0.0–0.2)

## 2021-06-10 LAB — URINALYSIS, COMPLETE (UACMP) WITH MICROSCOPIC
Bacteria, UA: NONE SEEN
Bilirubin Urine: NEGATIVE
Glucose, UA: 50 mg/dL — AB
Hgb urine dipstick: NEGATIVE
Ketones, ur: NEGATIVE mg/dL
Leukocytes,Ua: NEGATIVE
Nitrite: NEGATIVE
Protein, ur: 30 mg/dL — AB
Specific Gravity, Urine: 1.027 (ref 1.005–1.030)
pH: 6 (ref 5.0–8.0)

## 2021-06-10 LAB — PROTIME-INR
INR: 1.1 (ref 0.8–1.2)
Prothrombin Time: 14.2 seconds (ref 11.4–15.2)

## 2021-06-10 LAB — TYPE AND SCREEN
ABO/RH(D): O POS
Antibody Screen: NEGATIVE

## 2021-06-10 LAB — MAGNESIUM: Magnesium: 1.6 mg/dL — ABNORMAL LOW (ref 1.7–2.4)

## 2021-06-10 LAB — CREATININE, SERUM
Creatinine, Ser: 0.91 mg/dL (ref 0.44–1.00)
GFR, Estimated: >60 mL/min (ref >60)

## 2021-06-10 LAB — PROCALCITONIN: Procalcitonin: <0.1 ng/mL

## 2021-06-10 LAB — APTT: aPTT: 26 seconds (ref 24–36)

## 2021-06-10 LAB — TROPONIN I (HIGH SENSITIVITY)
Troponin I (High Sensitivity): 67 ng/L — ABNORMAL HIGH (ref <18)
Troponin I (High Sensitivity): 61 ng/L — ABNORMAL HIGH (ref <18)

## 2021-06-10 LAB — GLUCOSE, CAPILLARY: Glucose-Capillary: 156 mg/dL — ABNORMAL HIGH (ref 70–99)

## 2021-06-10 LAB — BRAIN NATRIURETIC PEPTIDE: B Natriuretic Peptide: 563.3 pg/mL — ABNORMAL HIGH (ref 0.0–100.0)

## 2021-06-10 MED ORDER — INSULIN ASPART 100 UNIT/ML IJ SOLN
0.0000 [IU] | INTRAMUSCULAR | Status: DC
  Administered 2021-06-11: 15 [IU] via SUBCUTANEOUS
  Administered 2021-06-11: 4 [IU] via SUBCUTANEOUS
  Administered 2021-06-11: 3 [IU] via SUBCUTANEOUS
  Administered 2021-06-11: 4 [IU] via SUBCUTANEOUS
  Administered 2021-06-11: 7 [IU] via SUBCUTANEOUS
  Administered 2021-06-12: 11 [IU] via SUBCUTANEOUS
  Administered 2021-06-12: 7 [IU] via SUBCUTANEOUS
  Administered 2021-06-12: 15 [IU] via SUBCUTANEOUS
  Administered 2021-06-12: 3 [IU] via SUBCUTANEOUS
  Administered 2021-06-13: 7 [IU] via SUBCUTANEOUS
  Administered 2021-06-13: 3 [IU] via SUBCUTANEOUS
  Administered 2021-06-13: 7 [IU] via SUBCUTANEOUS
  Filled 2021-06-10 (×12): qty 1

## 2021-06-10 MED ORDER — ALBUTEROL SULFATE (2.5 MG/3ML) 0.083% IN NEBU: 2.5000 mg | INHALATION_SOLUTION | RESPIRATORY_TRACT | Status: DC | PRN

## 2021-06-10 MED ORDER — SODIUM CHLORIDE 0.9 % IV SOLN
2.0000 g | Freq: Once | INTRAVENOUS | Status: AC
  Administered 2021-06-10: 2 g via INTRAVENOUS
  Filled 2021-06-10: qty 2

## 2021-06-10 MED ORDER — SODIUM CHLORIDE 0.9 % IV SOLN
2.0000 g | Freq: Two times a day (BID) | INTRAVENOUS | Status: DC
  Administered 2021-06-11: 2 g via INTRAVENOUS
  Filled 2021-06-10 (×2): qty 2

## 2021-06-10 MED ORDER — BUDESONIDE 0.25 MG/2ML IN SUSP
0.2500 mg | Freq: Two times a day (BID) | RESPIRATORY_TRACT | Status: DC
  Administered 2021-06-11 – 2021-06-17 (×14): 0.25 mg via RESPIRATORY_TRACT
  Filled 2021-06-10 (×14): qty 2

## 2021-06-10 MED ORDER — SODIUM CHLORIDE 0.9 % IV SOLN
500.0000 mg | Freq: Once | INTRAVENOUS | Status: AC
  Administered 2021-06-10: 500 mg via INTRAVENOUS
  Filled 2021-06-10: qty 5

## 2021-06-10 MED ORDER — SODIUM CHLORIDE 0.9% FLUSH: 3.0000 mL | Freq: Two times a day (BID) | INTRAVENOUS | Status: DC

## 2021-06-10 MED ORDER — NICOTINE 14 MG/24HR TD PT24: 14.0000 mg | MEDICATED_PATCH | Freq: Every day | TRANSDERMAL | Status: DC

## 2021-06-10 MED ORDER — SODIUM CHLORIDE 0.9 % IV SOLN
1000.0000 mg | Freq: Every day | INTRAVENOUS | Status: DC
  Administered 2021-06-10: 1000 mg via INTRAVENOUS
  Filled 2021-06-10 (×2): qty 16

## 2021-06-10 MED ORDER — VANCOMYCIN HCL IN DEXTROSE 1-5 GM/200ML-% IV SOLN
1000.0000 mg | Freq: Once | INTRAVENOUS | Status: AC
  Administered 2021-06-10: 1000 mg via INTRAVENOUS
  Filled 2021-06-10: qty 200

## 2021-06-10 MED ORDER — SODIUM CHLORIDE 0.9 % IV BOLUS (SEPSIS)
1000.0000 mL | Freq: Once | INTRAVENOUS | Status: AC
  Administered 2021-06-10: 1000 mL via INTRAVENOUS

## 2021-06-10 MED ORDER — LACTATED RINGERS IV SOLN: INTRAVENOUS | Status: DC

## 2021-06-10 MED ORDER — MAGNESIUM SULFATE 4 GM/100ML IV SOLN
4.0000 g | Freq: Once | INTRAVENOUS | Status: AC
  Administered 2021-06-11: 4 g via INTRAVENOUS
  Filled 2021-06-10: qty 100

## 2021-06-10 MED ORDER — SODIUM CHLORIDE 0.9% FLUSH
3.0000 mL | Freq: Two times a day (BID) | INTRAVENOUS | Status: DC
  Administered 2021-06-10 – 2021-06-17 (×14): 3 mL via INTRAVENOUS

## 2021-06-10 MED ORDER — SODIUM CHLORIDE 0.9 % IV SOLN: 250.0000 mL | INTRAVENOUS | Status: DC | PRN

## 2021-06-10 MED ORDER — SODIUM CHLORIDE 0.9 % IV SOLN: 1000.0000 mg | Freq: Every day | INTRAVENOUS | Status: DC

## 2021-06-10 MED ORDER — LEVALBUTEROL HCL 0.63 MG/3ML IN NEBU
0.6300 mg | INHALATION_SOLUTION | Freq: Four times a day (QID) | RESPIRATORY_TRACT | Status: DC
  Administered 2021-06-11: 0.63 mg via RESPIRATORY_TRACT
  Filled 2021-06-10 (×2): qty 3

## 2021-06-10 MED ORDER — SODIUM CHLORIDE 0.9 % IV BOLUS (SEPSIS): 1000.0000 mL | Freq: Once | INTRAVENOUS | Status: DC

## 2021-06-10 MED ORDER — SODIUM CHLORIDE 0.9% FLUSH: 3.0000 mL | INTRAVENOUS | Status: DC | PRN

## 2021-06-10 MED ORDER — ACETAMINOPHEN 650 MG RE SUPP
650.0000 mg | Freq: Four times a day (QID) | RECTAL | Status: DC | PRN
Start: 2021-06-10 — End: 2021-06-17

## 2021-06-10 MED ORDER — SODIUM CHLORIDE 0.9 % IV BOLUS (SEPSIS): 250.0000 mL | Freq: Once | INTRAVENOUS | Status: DC

## 2021-06-10 MED ORDER — HYDRALAZINE HCL 20 MG/ML IJ SOLN: 5.0000 mg | INTRAMUSCULAR | Status: DC | PRN

## 2021-06-10 MED ORDER — PANTOPRAZOLE SODIUM 40 MG IV SOLR
40.0000 mg | INTRAVENOUS | Status: DC
  Administered 2021-06-10 – 2021-06-14 (×5): 40 mg via INTRAVENOUS
  Filled 2021-06-10 (×5): qty 10

## 2021-06-10 MED ORDER — HEPARIN SODIUM (PORCINE) 5000 UNIT/ML IJ SOLN
5000.0000 [IU] | Freq: Three times a day (TID) | INTRAMUSCULAR | Status: AC
  Administered 2021-06-10 – 2021-06-13 (×9): 5000 [IU] via SUBCUTANEOUS
  Filled 2021-06-10 (×9): qty 1

## 2021-06-10 MED ORDER — LACTATED RINGERS IV SOLN: INTRAVENOUS | Status: AC

## 2021-06-10 MED ORDER — IOHEXOL 350 MG/ML SOLN
75.0000 mL | Freq: Once | INTRAVENOUS | Status: AC | PRN
  Administered 2021-06-10: 75 mL via INTRAVENOUS

## 2021-06-10 MED ORDER — ACETAMINOPHEN 325 MG PO TABS
650.0000 mg | ORAL_TABLET | Freq: Four times a day (QID) | ORAL | Status: DC | PRN
  Administered 2021-06-11 – 2021-06-16 (×3): 650 mg via ORAL
  Filled 2021-06-10 (×4): qty 2

## 2021-06-10 MED ORDER — FUROSEMIDE 10 MG/ML IJ SOLN
20.0000 mg | Freq: Three times a day (TID) | INTRAMUSCULAR | Status: DC
  Administered 2021-06-10 – 2021-06-12 (×4): 20 mg via INTRAVENOUS
  Filled 2021-06-10 (×4): qty 2

## 2021-06-10 NOTE — Consult Note (Signed)
CODE SEPSIS - PHARMACY COMMUNICATION ? ?**Broad Spectrum Antibiotics should be administered within 1 hour of Sepsis diagnosis** ? ?Time Code Sepsis Called/Page Received: 1639 ? ?Antibiotics Ordered: azithromycin and cefepime ? ?Time of 1st antibiotic administration: 1716 ? ?Additional action taken by pharmacy: none ? ?If necessary, Name of Provider/Nurse Contacted: n/a ? ? ?Sierra Guzman PharmD, BCPS ?06/10/2021 5:32 PM ? ?

## 2021-06-10 NOTE — Consult Note (Signed)
PHARMACY -  BRIEF ANTIBIOTIC NOTE  ? ?Pharmacy has received consult(s) for Cefepime and Vancomycin from an ED provider.  The patient's profile has been reviewed for ht/wt/allergies/indication/available labs.   ? ?One time order(s) placed for Cefepime and Vancomycin ? ?Further antibiotics/pharmacy consults should be ordered by admitting physician if indicated.       ?                ?Thank you, ?Danyella Mcginty Rodriguez-Guzman PharmD, BCPS ?06/10/2021 5:08 PM ? ?

## 2021-06-10 NOTE — Sepsis Progress Note (Signed)
Sepsis bundle complete with stable blood pressure. She is now on SDU and will be monitored as SDU patient. Close sepsis monitoring. ?

## 2021-06-10 NOTE — Progress Notes (Signed)
Elink is following Code Sepsis bundle. °

## 2021-06-10 NOTE — ED Notes (Signed)
Only able to obtain 1 set of blood cultures.  Began anbx to avoid delay ? ?

## 2021-06-10 NOTE — ED Notes (Signed)
Tried to wean patient from NRB and placed on 5L [place].  Pt's sats were in the 90s but anxiety increased and patient requested to be placed back on the NRB.  EDP aware ? ?

## 2021-06-10 NOTE — ED Notes (Signed)
RT at bedside to place pt on BiPAP

## 2021-06-10 NOTE — Consult Note (Signed)
NAME:  Sierra Guzman, MRN:  [id number], DOB:  Aug 10, 1940, LOS: 0 ADMISSION DATE:  06/10/2021, CONSULTATION DATE:  06/10/21 REFERRING MD:  Dr. Girard Guzman, CHIEF COMPLAINT:  shortness of breath   History of Present Illness:  81 yo F presenting to [facility] ED from home via EMS on NRB with complaints of increasing dyspnea even after titration up on home O2 from 4 L to 6 L [place] and nebulizer treatments that were ineffective. Per the patient and daughter, Sierra Guzman's, report the patient was in her normal state of health until she developed progressive dyspnea over the past week. This caused her to visit her pulmonologist on Wed, 06/08/21, where she was treated with a dose of steroids & nebulizer. This helped only incrementally and by 06/09/21 her dyspnea overnight was bad enough to cause her to come the the ED today for help. Per the daughter the patient's anxiety has also been getting progressively worse at home, she has been taking Zoloft for the last 3 weeks without effect. The patient admits to dyspnea and a productive cough with clear sputum. She denies chest pain, dysuria, vomiting/nausea/diarrhea, fever/chills. She does state her stomach felt "funny" at home but currently her stomach feels fine. Per EMS the patient was hypoxic at 85% on 6 L [place] & was transported on a NRB.  ED course: Patient tachypneic with increased WOB, work up for sepsis, acute HFrEF, & PE. Medications given: azithromycin, vancomycin & cefepime, IV contrast, 1 L IVF bolus Initial Vitals: afebrile 97.4, tachypneic-29, tachycardic 132, BP 116/63 with SpO2 96% on NRB Significant labs: (Labs/ Imaging personally reviewed) I, Sierra Guzman, AGACNP-BC, personally viewed and interpreted this ECG. EKG Interpretation: Date: 06/10/21, EKG Time: 16:45, Rate: 131, Rhythm: ST, QRS Axis:  LAD, Intervals: normal, ST/T Wave abnormalities: none, Narrative Interpretation: ST with probable LVH Chemistry: Na+: 143, K+: 5.0, BUN/Cr.: 26/1.04, Serum CO2/ AG:  30/7 Hematology: WBC: 8.9 > 11, Hgb: 9.2,  Troponin: 67, BNP: 563.3, Lactic/ PCT: 2.7 > 1.8/ <0.10,  COVID-19 & Influenza A/B: negative  CXR 06/10/21: chronic interstitial lung disease, similar to prior. CT angio chest w/wo contrast 06/10/21: no evidence of pulmonary embolus. Stable basilar predominant pulmonary fibrosis & bibasilar bronchiectasis. Increased interlobular septal thickening and ground glass opacities within the mid & upper lung zones, possible superimposed edema.  PCCM consulted for admission due to Acute on chronic hypoxic respiratory failure in the setting of pulmonary fibrosis and suspected flare.  Pertinent  Medical History  Anemia Anxiety Asthma Combined systolic & diastolic CHF: ([id number]: LVEF 45-55%, G1DD, RV fxn moderately reduced/mildly enlarged/RV systolic pressure [id number], severe calcification of aortic valve with mild regurg & stenosis) CKD stage 3a T2DM CAD HLD HTN ILD- pulmonary fibrosis Gout DDD GERD Significant Hospital Events: Including procedures, antibiotic start and stop dates in addition to other pertinent events   06/10/21: Admit to SDU with acute on chronic hypoxic respiratory failure on BIPAP in the setting of pulmonary fibrosis & acute on chronic combined CHF> PCCM consulted.  Interim History / Subjective:  Patient alert & oriented resting on BIPAP, comfortable appearing  Objective   Blood pressure 90/65, pulse 69, temperature (!) 97.4 F (36.3 C), temperature source Oral, resp. rate 20, height 5\' 5"  (1.651 m), weight 63.9 kg, SpO2 96 %.       No intake or output data in the 24 hours ending 06/10/21 2223 Filed Weights   06/10/21 2100  Weight: 63.9 kg    Examination: General: Adult female, acutely ill, lying in bed, NAD  HEENT: MM pink/moist, anicteric, atraumatic, neck supple Neuro: A&O x 4, able to follow commands, PERRL +3, MAE CV: s1s2 RRR, ST on monitor, no r/m/g Pulm: Regular, non labored on BIPAP 35%, breath sounds  coarse/diminished-BUL & diminished-BLL GI: soft, rounded, non tender, bs x 4 GU: purewick in place with clear yellow urine Skin:  no rashes/lesions noted Extremities: warm/dry, pulses + 2 R/P, +1 edema noted bilateral ankles  Resolved Hospital Problem list     Assessment & Plan:  Acute on chronic Hypoxic Respiratory Failure secondary to ILD flare in the setting of pulmonary fibrosis & acute on chronic combined CHF PMHx: pulmonary fibrosis - Attempt to wean to HHFNC/HFNC > otherwise Continue BIPAP overnight, wean FiO2 as tolerated - Supplemental O2 to maintain SpO2 > 90% - Intermittent chest x-ray & ABG PRN - Ensure adequate pulmonary hygiene  - F/u cultures, trend PCT, f/u Strep Pna Ur Antigen & legionella - f/u respiratory pathogen panel - Continue CAP coverage: cefepime - budesonide nebs BID, bronchodilators PRN - pulse solu-medrol, 1g/day x 3 days  Acute on Chronic HFrEF exacerbation Elevated Troponin secondary to N-STEMI vs demand ischemia, suspect demand ischemia Paroxsymal Atrial Tachycardia PMHx: CHF BNP: 563.3, troponin: 67 - Echocardiogram ordered per primary service - f/u BNP, Trend troponins  - continuous cardiac monitoring - Daily weights to assess volume status - Diurese with the use of IV lasix as BP & renal function allow - Supplemental oxygen as needed, maintain SpO2 > 90% - BIPAP therapy as needed  - trial lopressor 2.5 mg, if BP allows consider restarting lopressor at 12.5 mg BID  Suspected sepsis without septic shock due to suspected HCAP Initial interventions/workup included: 1 L of NS & Cefepime/ Vancomycin/ Azithromycin - Supplemental oxygen as needed, to maintain SpO2 > 90% - f/u cultures, trend lactic/ PCT - Daily CBC, monitor WBC/ fever curve - IV antibiotics: cefepime as above  Anxiety Daughter reports the patient had a one-time dose of atarax which helped. - atarax PRN - hold Zoloft due to mild Transaminitis, consider restarting if AM numbers  are improved - supportive care  CKD stage 3a Cr appears at baseline - Strict I/O's: alert provider if UOP < 0.5 mL/kg/hr - Daily BMP, replace electrolytes PRN  Poorly controlled Type 2 Diabetes Mellitus Risk for Steroid Induced Hyperglycemia Hemoglobin A1C: pending (HA1C 8/22: 8.1) - Monitor CBG Q 4 hours - SSI resistant dosing - target range while in ICU: 140-180 - follow ICU hyper/hypo-glycemia protocol  Mild Transaminitis  - Trend hepatic function - Consider RUQ US/CT abdomen/pelvis if no improvement in AM - hold statin  Best Practice (right click and "Reselect all SmartList Selections" daily)  Diet/type: NPO w/ oral meds DVT prophylaxis: prophylactic heparin  GI prophylaxis: PPI Lines: N/A Foley:  N/A Code Status:  full code Last date of multidisciplinary goals of care discussion [06/10/21]  Labs   CBC: Recent Labs  Lab 06/10/21 1708 06/10/21 2011  WBC 8.9 11.0*  NEUTROABS 7.3  --   HGB 9.2* 9.1*  HCT 30.4* 30.0*  MCV 103.1* 103.8*  PLT 241 [id number]    Basic Metabolic Panel: Recent Labs  Lab 06/10/21 1708 06/10/21 2011  NA 143  --   K 5.0  --   CL 106  --   CO2 30  --   GLUCOSE 216*  --   BUN 26*  --   CREATININE 1.04* 0.91  CALCIUM 9.3  --   MG  --  1.6*   GFR: Estimated Creatinine Clearance: 43.6 mL/min (  by C-G formula based on SCr of 0.91 mg/dL). Recent Labs  Lab 06/10/21 1708 06/10/21 2011  PROCALCITON <0.10  --   WBC 8.9 11.0*  LATICACIDVEN 2.7* 1.8    Liver Function Tests: Recent Labs  Lab 06/10/21 1708  AST 81*  ALT 46*  ALKPHOS 47  BILITOT 1.0  PROT 6.3*  ALBUMIN 2.9*   No results for input(s): LIPASE, AMYLASE in the last 168 hours. No results for input(s): AMMONIA in the last 168 hours.  ABG    Component Value Date/Time   PHART 7.42 03/07/2021 1454   PCO2ART 35 03/07/2021 1454   PO2ART 72 (L) 03/07/2021 1454   HCO3 22.7 03/07/2021 1454   ACIDBASEDEF 1.5 03/07/2021 1454   O2SAT 94.6 03/07/2021 1454     Coagulation  Profile: No results for input(s): INR, PROTIME in the last 168 hours.  Cardiac Enzymes: No results for input(s): CKTOTAL, CKMB, CKMBINDEX, TROPONINI in the last 168 hours.  HbA1C: Hgb A1c MFr Bld  Date/Time Value Ref Range Status  12/05/2020 06:23 AM 8.1 (H) 4.8 - 5.6 % Final    Comment:    (NOTE) Pre diabetes:          5.7%-6.4%  Diabetes:              >6.4%  Glycemic control for   <7.0% adults with diabetes     CBG: No results for input(s): GLUCAP in the last 168 hours.  Review of Systems: Positives in BOLD  Gen: Denies fever, chills, weight change, fatigue, night sweats HEENT: Denies blurred vision, double vision, hearing loss, tinnitus, sinus congestion, rhinorrhea, sore throat, neck stiffness, dysphagia PULM: Denies shortness of breath, cough, sputum production, hemoptysis, wheezing CV: Denies chest pain, edema, orthopnea, paroxysmal nocturnal dyspnea, palpitations GI: Denies abdominal pain, nausea, vomiting, diarrhea, hematochezia, melena, constipation, change in bowel habits GU: Denies dysuria, hematuria, polyuria, oliguria, urethral discharge Endocrine: Denies hot or cold intolerance, polyuria, polyphagia or appetite change Derm: Denies rash, dry skin, scaling or peeling skin change Heme: Denies easy bruising, bleeding, bleeding gums Neuro: Denies headache, numbness, weakness, slurred speech, loss of memory or consciousness  Past Medical History:  She,  has a past medical history of Anemia (10/28/2010), Anxiety, Aortic valve sclerosis, Arthritis, Asthma, Chronic diastolic CHF (congestive heart failure) ([place]), Chronic kidney disease, Coronary artery disease (10/28/2010), DDD (degenerative disc disease), lumbar (2006), Diabetes type 2, controlled ([place]) (10/28/2010), Dyspnea, GERD (gastroesophageal reflux disease), Gout (10/28/2010), Heart murmur, History of cardiac catheterization (2006), History of cardiovascular stress test (09/23/2009), History of echocardiogram  (05/27/2008), Hyperlipidemia, Hyperlipidemia LDL goal <100 (10/28/2010), Hypertension (10/28/2010), IPF (idiopathic pulmonary fibrosis) ([place]), Mild vitamin D deficiency (02/22/2011), Obesity (BMI 30-39.9) (10/28/2010), Osteoarthritis (10/28/2010), Personal history of colonic polyps (10/28/2010), Shortness of breath, Syncope (2013), and Vitamin B 12 deficiency (10/28/2010).   Surgical History:   Past Surgical History:  Procedure Laterality Date   BACK SURGERY  2006   DDD   BREAST BIOPSY Right    neg   CARDIAC CATHETERIZATION  2006   EYE SURGERY Bilateral    Cataract Extraction with IOL   JOINT REPLACEMENT Left    Left Total Hip Replacement   JOINT REPLACEMENT     RIGHT 2005; LEFT 2 X 2004, 2011   REPLACEMENT TOTAL KNEE Left    REVISION TOTAL KNEE ARTHROPLASTY Left    Dr. Ventura Right 12/12/2016   Procedure: TOTAL HIP ARTHROPLASTY ANTERIOR APPROACH;  Surgeon: Hessie Knows, MD;  Location: [facility] ORS;  Service: Orthopedics;  Laterality: Right;   TOTAL HIP ARTHROPLASTY Left 12/26/2013   Procedure: ARTHROPLASTY HIP TOTAL; Surgeon: Zorita Pang, MD; Location: [place]; Service: Orthopedics; Laterality: left,    TOTAL KNEE ARTHROPLASTY     both knees     Social History:   reports that she quit smoking about 30 years ago. Her smoking use included cigarettes. She has a 2.50 pack-year smoking history. She has never used smokeless tobacco. She reports that she does not drink alcohol and does not use drugs.   Family History:  Her family history includes Breast cancer in her cousin; Breast cancer (age of onset: 1) in her mother; Diabetes in her mother; Stroke in her brother and mother.   Allergies Allergies  Allergen Reactions   Morphine And Related Nausea And Vomiting     Home Medications  Prior to Admission medications   Medication Sig Start Date End Date Taking? Authorizing Provider  acetaminophen (TYLENOL) 325 MG tablet Take 2  tablets (650 mg total) by mouth every 6 (six) hours as needed (pain). 01/17/21   Arrien, Jimmy Picket, MD  albuterol (ACCUNEB) 0.63 MG/3ML nebulizer solution Take 3 mLs by nebulization 3 (three) times daily.    [provider]  albuterol (PROVENTIL) (2.5 MG/3ML) 0.083% nebulizer solution Take 3 mLs (2.5 mg total) by nebulization every 2 (two) hours as needed for wheezing or shortness of breath. 03/10/21   Swayze, Ava, DO  allopurinol (ZYLOPRIM) 100 MG tablet Take 100 mg by mouth daily.    [provider]  aspirin EC 81 MG EC tablet Take 1 tablet (81 mg total) by mouth daily. Swallow whole. 01/18/21   Arrien, Jimmy Picket, MD  budesonide (PULMICORT) 0.25 MG/2ML nebulizer solution Take 2 mLs (0.25 mg total) by nebulization 2 (two) times daily. 03/10/21   Swayze, Ava, DO  cholecalciferol (VITAMIN D) 1000 units tablet Take 1,000 Units by mouth daily.    [provider]  COVID-19 mRNA bivalent vaccine, Pfizer, (PFIZER COVID-19 VAC BIVALENT) injection Inject into the muscle. 04/28/21   Carlyle Basques, MD  gabapentin (NEURONTIN) 100 MG capsule Take 1 capsule (100 mg total) by mouth 3 (three) times daily. 03/10/21   Swayze, Ava, DO  guaiFENesin-dextromethorphan (ROBITUSSIN DM) 100-10 MG/5ML syrup Take 5 mLs by mouth every 6 (six) hours as needed for cough. 01/17/21   Arrien, Jimmy Picket, MD  magnesium oxide (MAG-OX) 400 MG tablet Take 1 tablet by mouth daily. 07/21/20   [provider]  metFORMIN (GLUCOPHAGE) 850 MG tablet Take 1 tablet (850 mg total) by mouth 2 (two) times daily with a meal. 01/19/21 01/19/22  Max Sane, MD  metoprolol succinate (TOPROL-XL) 25 MG 24 hr tablet Take 12.5 mg by mouth daily.    [provider]  Nystatin (GERHARDT'S BUTT CREAM) CREA Apply 1 application topically 3 (three) times daily. Patient not taking: Reported on 03/07/2021 01/03/21   Arrien, Jimmy Picket, MD  Omega-3 Fatty Acids (FISH OIL) 1000 MG CAPS Take 1,000 mg by  mouth daily.    [provider]  omeprazole (PRILOSEC) 20 MG capsule Take 20 mg by mouth daily. 11/05/20   [provider]  Pirfenidone 267 MG TABS Take 267 mg by mouth 3 (three) times daily.    [provider]  polyethylene glycol (MIRALAX / GLYCOLAX) 17 g packet Take 17 g by mouth daily. Patient not taking: Reported on 03/07/2021 12/19/20   Shelly Coss, MD  predniSONE (DELTASONE) 10 MG tablet Take 1 tablet (10 mg total) by mouth daily.  03/10/21   Swayze, Ava, DO  rosuvastatin (CRESTOR) 40 MG tablet Take 40 mg by mouth daily. 09/17/20   [provider]  sodium chloride (OCEAN) 0.65 % SOLN nasal spray Place 2 sprays into both nostrils as needed for congestion. 03/10/21   Swayze, Ava, DO  Vitamin D, Ergocalciferol, (DRISDOL) 1.25 MG (50000 UNIT) CAPS capsule Take 1 capsule (50,000 Units total) by mouth every 7 (seven) days. Patient not taking: Reported on 03/07/2021 12/22/20   Shelly Coss, MD  enoxaparin (LOVENOX) 40 MG/0.4ML injection Inject 0.4 mLs (40 mg total) into the skin daily. 12/15/16 03/25/19  Duanne Guess, PA-C  pregabalin (LYRICA) 50 MG capsule Take 1 capsule (50 mg total) by mouth 3 (three) times daily. 12/18/16 03/25/19  Latanya Maudlin, NP  ranitidine (ZANTAC) 300 MG capsule Take 300 mg by mouth every evening.  03/25/19  [provider]  simvastatin (ZOCOR) 40 MG tablet Take 40 mg by mouth daily at 6 PM.  03/25/19  [provider]     Critical care time: 58 minutes       Venetia Night, AGACNP-BC Acute Care Nurse Practitioner [facility]   [phone] / [phone] Please see Amion for pager details.

## 2021-06-10 NOTE — Progress Notes (Signed)
Pharmacy Antibiotic Note ? ?Sierra Guzman is a 81 y.o. female admitted on 06/10/2021 with pneumonia.  Pharmacy has been consulted for Cefepime dosing. ? ?Plan: ?Cefepime 2 gm IV X 1 given on 3/3 @ 1700. ?Cefepime 2 gm IV Q12H ordered to continue on 3/4 @ 0500.  ? ?Height: 5\' 5"  (165.1 cm) ?Weight: 63.9 kg (140 lb 14 oz) ?IBW/kg (Calculated) : 57 ? ?Temp (24hrs), Avg:97.4 ?F (36.3 ?C), Min:97.4 ?F (36.3 ?C), Max:97.4 ?F (36.3 ?C) ? ?Recent Labs  ?Lab 06/10/21 ?1708 06/10/21 ?2011  ?WBC 8.9 11.0*  ?CREATININE 1.04* 0.91  ?LATICACIDVEN 2.7* 1.8  ?  ?Estimated Creatinine Clearance: 43.6 mL/min (by C-G formula based on SCr of 0.91 mg/dL).   ? ?Allergies  ?Allergen Reactions  ? Morphine And Related Nausea And Vomiting  ? ? ?Antimicrobials this admission: ?  >>  ?  >>  ? ?Dose adjustments this admission: ? ? ?Microbiology results: ? BCx:  ? UCx:   ? Sputum:   ? MRSA PCR:  ? ?Thank you for allowing pharmacy to be a part of this patient?s care. ? ?Arben Packman D ?06/10/2021 10:39 PM ? ?

## 2021-06-10 NOTE — ED Notes (Signed)
This RN called & spoke with RT re: Dr. Eliane Decree order for BiPAP on this pt. ?

## 2021-06-10 NOTE — ED Notes (Signed)
NT to bedside to answer pt call bell. Pt complaining of pain on her rectal area. Pt also asked for water. Pt was given water. Rn was informed of pt complaint. ?

## 2021-06-10 NOTE — ED Notes (Signed)
Pt noted to have small wound to sacral area, pt states has been there a while & is still healing, but she wanted [place] to know about.  ?

## 2021-06-10 NOTE — ED Provider Notes (Signed)
[facility] Provider Note    Event Date/Time   First MD Initiated Contact with Patient 06/10/21 1641     (approximate)   History   Shortness of Breath   HPI  Sierra Guzman is a 81 y.o. female with a history of hypertension diabetes CHF CKD and pulmonary fibrosis on 4 L nasal cannula at home who comes ED complaining of worsening shortness of breath for the past few days.  Tried nebulizer treatments at home without relief.  She tried increasing her home oxygen from 4 L to 6 L but still felt significantly short of breath.  EMS report that on their arrival, on 6 L the patient's oxygen saturation was about 85%.  Patient denies chest pain or fever but does have productive cough.  No vomiting or diarrhea.     Physical Exam   Triage Vital Signs: ED Triage Vitals  Enc Vitals Group     BP 06/10/21 1640 (!) 90/59     Pulse Rate 06/10/21 1640 (!) 115     Resp 06/10/21 1640 (!) 24     Temp 06/10/21 1640 (!) 97.4 F (36.3 C)     Temp Source 06/10/21 1640 Oral     SpO2 06/10/21 1640 100 %     Weight --      Height --      Head Circumference --      Peak Flow --      Pain Score 06/10/21 1641 0     Pain Loc --      Pain Edu? --      Excl. in [place]? --     Most recent vital signs: Vitals:   06/10/21 2100 06/10/21 2200  BP: (!) 90/53 (!) 120/99  Pulse: (!) 113 (!) 124  Resp: (!) 31 (!) 42  Temp:    SpO2: 97% 98%     General: Awake, mild respiratory distress CV:  Good peripheral perfusion.  Irregularly irregular rhythm, tachycardia heart rate 120 Resp:  Normal effort.  Tachypnea.  Bilateral basilar crackles.  Symmetric air movement. Abd:  No distention.  Soft and nontender. Other:  No lower extremity edema.   ED Results / Procedures / Treatments   Labs (all labs ordered are listed, but only abnormal results are displayed) Labs Reviewed  LACTIC ACID, PLASMA - Abnormal; Notable for the following components:      Result Value   Lactic Acid, Venous 2.7  (*)    All other components within normal limits  COMPREHENSIVE METABOLIC PANEL - Abnormal; Notable for the following components:   Glucose, Bld 216 (*)    BUN 26 (*)    Creatinine, Ser 1.04 (*)    Total Protein 6.3 (*)    Albumin 2.9 (*)    AST 81 (*)    ALT 46 (*)    GFR, Estimated 54 (*)    All other components within normal limits  CBC WITH DIFFERENTIAL/PLATELET - Abnormal; Notable for the following components:   RBC 2.95 (*)    Hemoglobin 9.2 (*)    HCT 30.4 (*)    MCV 103.1 (*)    All other components within normal limits  BRAIN NATRIURETIC PEPTIDE - Abnormal; Notable for the following components:   B Natriuretic Peptide 563.3 (*)    All other components within normal limits  CBC - Abnormal; Notable for the following components:   WBC 11.0 (*)    RBC 2.89 (*)    Hemoglobin 9.1 (*)  HCT 30.0 (*)    MCV 103.8 (*)    All other components within normal limits  MAGNESIUM - Abnormal; Notable for the following components:   Magnesium 1.6 (*)    All other components within normal limits  TROPONIN I (HIGH SENSITIVITY) - Abnormal; Notable for the following components:   Troponin I (High Sensitivity) 67 (*)    All other components within normal limits  TROPONIN I (HIGH SENSITIVITY) - Abnormal; Notable for the following components:   Troponin I (High Sensitivity) 61 (*)    All other components within normal limits  RESP PANEL BY RT-PCR (FLU A&B, COVID) ARPGX2  CULTURE, BLOOD (ROUTINE X 2)  CULTURE, BLOOD (ROUTINE X 2)  URINE CULTURE  MRSA NEXT GEN BY PCR, NASAL  RESPIRATORY PANEL BY PCR  LACTIC ACID, PLASMA  PROCALCITONIN  PROTIME-INR  APTT  CREATININE, SERUM  URINALYSIS, COMPLETE (UACMP) WITH MICROSCOPIC  CBC  COMPREHENSIVE METABOLIC PANEL  MAGNESIUM  PHOSPHORUS  PROCALCITONIN  STREP PNEUMONIAE URINARY ANTIGEN  LEGIONELLA PNEUMOPHILA SEROGP 1 UR AG  HEMOGLOBIN A1C  BRAIN NATRIURETIC PEPTIDE  TYPE AND SCREEN     EKG  Interpreted by me Sinus tachycardia  with sinus arrhythmia.  Normal axis, normal intervals.  Normal QRS ST segments and T waves.  No ischemic changes.   RADIOLOGY Chest x-ray viewed and interpreted by me, shows underlying structural disease without consolidation.  Radiology report reviewed.  CT angiogram of the chest radiology report reviewed, negative for PE, suggestive of underlying pulmonary edema.    PROCEDURES:  Critical Care performed: Yes, see critical care procedure note(s)  .1-3 Lead EKG Interpretation Performed by: Carrie Mew, MD Authorized by: Carrie Mew, MD     Interpretation: abnormal     ECG rate:  120   ECG rate assessment: tachycardic     Rhythm: atrial fibrillation     Ectopy: none     Conduction: normal   Comments:        .Critical Care Performed by: Carrie Mew, MD Authorized by: Carrie Mew, MD   Critical care provider statement:    Critical care time (minutes):  35   Critical care time was exclusive of:  Separately billable procedures and treating other patients   Critical care was necessary to treat or prevent imminent or life-threatening deterioration of the following conditions:  Sepsis, respiratory failure, cardiac failure and circulatory failure   Critical care was time spent personally by me on the following activities:  Development of treatment plan with patient or surrogate, discussions with consultants, evaluation of patient's response to treatment, examination of patient, obtaining history from patient or surrogate, ordering and performing treatments and interventions, ordering and review of laboratory studies, ordering and review of radiographic studies, pulse oximetry, re-evaluation of patient's condition and review of old charts   Care discussed with: admitting provider     MEDICATIONS ORDERED IN ED: Medications  sodium chloride 0.9 % bolus 1,000 mL (0 mLs Intravenous Stopped 06/10/21 1846)    And  sodium chloride 0.9 % bolus 1,000 mL (1,000 mLs  Intravenous Not Given 06/10/21 2025)    And  sodium chloride 0.9 % bolus 250 mL (250 mLs Intravenous Not Given 06/10/21 2221)  furosemide (LASIX) injection 20 mg (20 mg Intravenous Given 06/10/21 2220)  sodium chloride flush (NS) 0.9 % injection 3 mL (3 mLs Intravenous Given 06/10/21 2221)  acetaminophen (TYLENOL) tablet 650 mg (has no administration in time range)    Or  acetaminophen (TYLENOL) suppository 650 mg (has no administration in time  range)  hydrALAZINE (APRESOLINE) injection 5 mg (has no administration in time range)  heparin injection 5,000 Units (5,000 Units Subcutaneous Given 06/10/21 2220)  sodium chloride flush (NS) 0.9 % injection 3 mL (has no administration in time range)  sodium chloride flush (NS) 0.9 % injection 3 mL (has no administration in time range)  0.9 %  sodium chloride infusion (has no administration in time range)  lactated ringers infusion (has no administration in time range)  magnesium sulfate IVPB 4 g 100 mL (has no administration in time range)  methylPREDNISolone sodium succinate (SOLU-MEDROL) 1,000 mg in sodium chloride 0.9 % 50 mL IVPB (has no administration in time range)  pantoprazole (PROTONIX) injection 40 mg (has no administration in time range)  ceFEPIme (MAXIPIME) 2 g in sodium chloride 0.9 % 100 mL IVPB (has no administration in time range)  budesonide (PULMICORT) nebulizer solution 0.25 mg (has no administration in time range)  albuterol (PROVENTIL) (2.5 MG/3ML) 0.083% nebulizer solution 2.5 mg (has no administration in time range)  insulin aspart (novoLOG) injection 0-20 Units (has no administration in time range)  vancomycin (VANCOCIN) IVPB 1000 mg/200 mL premix (0 mg Intravenous Stopped 06/10/21 2200)  ceFEPIme (MAXIPIME) 2 g in sodium chloride 0.9 % 100 mL IVPB (0 g Intravenous Stopped 06/10/21 1746)  azithromycin (ZITHROMAX) 500 mg in sodium chloride 0.9 % 250 mL IVPB (0 mg Intravenous Stopped 06/10/21 1904)  iohexol (OMNIPAQUE) 350 MG/ML injection 75 mL  (75 mLs Intravenous Contrast Given 06/10/21 1845)     IMPRESSION / MDM / ASSESSMENT AND PLAN / ED COURSE  I reviewed the triage vital signs and the nursing notes.                              Differential diagnosis includes, but is not limited to, pneumonia, sepsis, interstitial lung disease flare, CHF exacerbation, electrolyte abnormality, anemia  **The patient is on the cardiac monitor to evaluate for evidence of arrhythmia and/or significant heart rate changes.**}  Patient presents with worsening shortness of breath and cough.  On arrival she is tachycardic, tachypneic, hypoxic beyond her usual baseline, concerning for pneumonia and sepsis.  Sepsis protocol initiated, cefepime and vancomycin ordered for antibiotic coverage.  Initial lactate of 2.7 normalized after IV fluids.  Blood pressure borderline, but acceptable MAP.  Not consistent with septic shock.  CT chest suggestive of pulmonary edema and no signs of infectious infiltrate.  Discussed with pulmonology and hospitalist for admission.  Patient will be transition to high flow nasal cannula, diuresed, steroids.     FINAL CLINICAL IMPRESSION(S) / ED DIAGNOSES   Final diagnoses:  Acute on chronic respiratory failure with hypoxia (HCC)  Atrial fibrillation, unspecified type ([place])     Rx / DC Orders   ED Discharge Orders     None        Note:  This document was prepared using Dragon voice recognition software and may include unintentional dictation errors.   Carrie Mew, MD 06/10/21 [phone]

## 2021-06-10 NOTE — ED Triage Notes (Addendum)
Pt from home.  Per EMS pt increasing shob and increased home O2 from 4L to 6L with neb treatments that weren't helpful.  Pt has hx of pulmonary fibrosis. No chest pain.  No CP, dizziness, n/v/d.  Pt arrived on NRB  ?

## 2021-06-10 NOTE — ED Notes (Signed)
This RN called report to receiving RN. ?

## 2021-06-10 NOTE — H&P (Signed)
History and Physical    Patient: Sierra Guzman PJA:[id number] DOB: 11-19-1940 DOA: 06/10/2021 DOS: the patient was seen and examined on 06/11/2021 PCP: Wardell Honour, MD  Patient coming from: Home  Chief Complaint:  Chief Complaint  Patient presents with   Shortness of Breath    HPI: Sierra Guzman is a 81 y.o. female with medical history significant of anemia, chronic diastolic congestive heart failure with EF of 45%, diabetes mellitus type 2 presenting with worsening progressive shortness of breath that has been going on for the past few days.  Patient went to her specialist where she was also treated with steroid treatments nebulizers.  Patient not able to give history because patient is in distress and becomes hypoxic with speaking.  HPI is per chart review.  Patient does not report any chest discomfort is in no distress at rest currently is on nonrebreather oxygenating 85%. Upon the emergency room patient blood pressures were low patient was tachycardic and met sepsis criteria and was given IV fluid along with empiric broad-spectrum antibiotics.  Patient received a total of 2250 mL of normal saline, in addition to vancomycin and cefepime and azithromycin.  Cultures collected.  Procalcitonin less than 10. Lactic acid 2.7.  Review of Systems:  Review of Systems  Unable to perform ROS: Acuity of condition   Past Medical History:  Diagnosis Date   Anemia 10/28/2010   unspecified   Anxiety    Aortic valve sclerosis    Arthritis    Asthma    Chronic combined systolic and diastolic CHF (congestive heart failure) ([place])    echo- 10/22 LVEF 45-55%, G1DD, moderately reduced RV fxn, mild aortic regurg/stenosis   Chronic kidney disease    Chronic Kidney Disease---Stage III   Coronary artery disease 10/28/2010   DDD (degenerative disc disease), lumbar 2006   Diabetes type 2, controlled ([place]) 10/28/2010   Dyspnea    GERD (gastroesophageal reflux disease)    Gout 10/28/2010   Heart murmur     History of cardiac catheterization 2006   LAD 50%, D1 50% rest normal.    History of cardiovascular stress test 09/23/2009    lexiscan Normal Cardiolite test, EF 61%. LV size and function - normal   History of echocardiogram 05/27/2008   EF 55%; Mild aortic insufficiency with minimal aortic sclerosis. L vent normal.    Hyperlipidemia    Hyperlipidemia LDL goal <100 10/28/2010   managed by Dr. 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, with UIP   Mild vitamin D deficiency 02/22/2011   Obesity (BMI 30-39.9) 10/28/2010   unspecified   Osteoarthritis 10/28/2010   bilateral knees, left hip   Personal history of colonic polyps 10/28/2010   Shortness of breath    Syncope 2013   Vitamin B 12 deficiency 10/28/2010   Past Surgical History:  Procedure Laterality Date   BACK SURGERY  2006   DDD   BREAST BIOPSY Right    neg   CARDIAC CATHETERIZATION  2006   EYE SURGERY Bilateral    Cataract Extraction with IOL   JOINT REPLACEMENT Left    Left Total Hip Replacement   JOINT REPLACEMENT     RIGHT 2005; LEFT 2 X 2004, 2011   REPLACEMENT TOTAL KNEE Left    REVISION TOTAL KNEE ARTHROPLASTY Left    Dr. [place] Right 12/12/2016   Procedure: TOTAL HIP ARTHROPLASTY ANTERIOR APPROACH;  Surgeon:  Hessie Knows, MD;  Location: [facility] ORS;  Service: Orthopedics;  Laterality: Right;   TOTAL HIP ARTHROPLASTY Left 12/26/2013   Procedure: ARTHROPLASTY HIP TOTAL; Surgeon: Zorita Pang, MD; Location: [facility]; Service: Orthopedics; Laterality: left,    TOTAL KNEE ARTHROPLASTY     both knees   Social History:  reports that she quit smoking about 30 years ago. Her smoking use included cigarettes. She has a 2.50 pack-year smoking history. She has never used smokeless tobacco. She reports that she does not drink alcohol and does not use drugs.  Allergies  Allergen Reactions   Morphine And Related Nausea And  Vomiting    Family History  Problem Relation Age of Onset   Breast cancer Mother 60   Diabetes Mother    Stroke Mother    Stroke Brother    Breast cancer Cousin        pat cousin    Prior to Admission medications   Medication Sig Start Date End Date Taking? Authorizing Provider  acetaminophen (TYLENOL) 325 MG tablet Take 2 tablets (650 mg total) by mouth every 6 (six) hours as needed (pain). 01/17/21   Arrien, Jimmy Picket, MD  albuterol (ACCUNEB) 0.63 MG/3ML nebulizer solution Take 3 mLs by nebulization 3 (three) times daily.    [provider]  albuterol (PROVENTIL) (2.5 MG/3ML) 0.083% nebulizer solution Take 3 mLs (2.5 mg total) by nebulization every 2 (two) hours as needed for wheezing or shortness of breath. 03/10/21   Swayze, Ava, DO  allopurinol (ZYLOPRIM) 100 MG tablet Take 100 mg by mouth daily.    [provider]  aspirin EC 81 MG EC tablet Take 1 tablet (81 mg total) by mouth daily. Swallow whole. 01/18/21   Arrien, Jimmy Picket, MD  budesonide (PULMICORT) 0.25 MG/2ML nebulizer solution Take 2 mLs (0.25 mg total) by nebulization 2 (two) times daily. 03/10/21   Swayze, Ava, DO  cholecalciferol (VITAMIN D) 1000 units tablet Take 1,000 Units by mouth daily.    [provider]  COVID-19 mRNA bivalent vaccine, Pfizer, (PFIZER COVID-19 VAC BIVALENT) injection Inject into the muscle. 04/28/21   Carlyle Basques, MD  gabapentin (NEURONTIN) 100 MG capsule Take 1 capsule (100 mg total) by mouth 3 (three) times daily. 03/10/21   Swayze, Ava, DO  guaiFENesin-dextromethorphan (ROBITUSSIN DM) 100-10 MG/5ML syrup Take 5 mLs by mouth every 6 (six) hours as needed for cough. 01/17/21   Arrien, Jimmy Picket, MD  magnesium oxide (MAG-OX) 400 MG tablet Take 1 tablet by mouth daily. 07/21/20   [provider]  metFORMIN (GLUCOPHAGE) 850 MG tablet Take 1 tablet (850 mg total) by mouth 2 (two) times daily with a meal. 01/19/21 01/19/22  Max Sane, MD   metoprolol succinate (TOPROL-XL) 25 MG 24 hr tablet Take 12.5 mg by mouth daily.    [provider]  Nystatin (GERHARDT'S BUTT CREAM) CREA Apply 1 application topically 3 (three) times daily. Patient not taking: Reported on 03/07/2021 01/03/21   Arrien, Jimmy Picket, MD  Omega-3 Fatty Acids (FISH OIL) 1000 MG CAPS Take 1,000 mg by mouth daily.    [provider]  omeprazole (PRILOSEC) 20 MG capsule Take 20 mg by mouth daily. 11/05/20   [provider]  Pirfenidone 267 MG TABS Take 267 mg by mouth 3 (three) times daily.    [provider]  polyethylene glycol (MIRALAX / GLYCOLAX) 17 g packet Take 17 g by mouth daily. Patient not taking: Reported on 03/07/2021 12/19/20   Shelly Coss, MD  predniSONE (DELTASONE)  10 MG tablet Take 1 tablet (10 mg total) by mouth daily. 03/10/21   Swayze, Ava, DO  rosuvastatin (CRESTOR) 40 MG tablet Take 40 mg by mouth daily. 09/17/20   [provider]  sodium chloride (OCEAN) 0.65 % SOLN nasal spray Place 2 sprays into both nostrils as needed for congestion. 03/10/21   Swayze, Ava, DO  Vitamin D, Ergocalciferol, (DRISDOL) 1.25 MG (50000 UNIT) CAPS capsule Take 1 capsule (50,000 Units total) by mouth every 7 (seven) days. Patient not taking: Reported on 03/07/2021 12/22/20   Shelly Coss, MD  enoxaparin (LOVENOX) 40 MG/0.4ML injection Inject 0.4 mLs (40 mg total) into the skin daily. 12/15/16 03/25/19  Duanne Guess, PA-C  pregabalin (LYRICA) 50 MG capsule Take 1 capsule (50 mg total) by mouth 3 (three) times daily. 12/18/16 03/25/19  Latanya Maudlin, NP  ranitidine (ZANTAC) 300 MG capsule Take 300 mg by mouth every evening.  03/25/19  [provider]  simvastatin (ZOCOR) 40 MG tablet Take 40 mg by mouth daily at 6 PM.  03/25/19  [provider]    Physical Exam: Vitals:   06/10/21 2032 06/10/21 2100 06/10/21 2200 06/11/21 0101  BP:  (!) 90/53 (!) 120/99 (!) 89/57  Pulse:  (!) 113 (!) 124   Resp:   (!) 31 (!) 42   Temp:      TempSrc:      SpO2: 96% 97% 98%   Weight:  63.9 kg    Height:  _0  (1.651 m)    Physical Exam Vitals and nursing note reviewed.  Constitutional:      General: She is in acute distress.     Appearance: Normal appearance. She is ill-appearing. She is not toxic-appearing or diaphoretic.  HENT:     Head: Normocephalic and atraumatic.     Right Ear: Hearing and external ear normal.     Left Ear: Hearing and external ear normal.     Nose: Nose normal. No nasal deformity.     Mouth/Throat:     Lips: Pink.     Mouth: Mucous membranes are moist.     Tongue: No lesions.     Pharynx: Oropharynx is clear.  Eyes:     Extraocular Movements: Extraocular movements intact.     Pupils: Pupils are equal, round, and reactive to light.  Neck:     Vascular: No carotid bruit.  Cardiovascular:     Rate and Rhythm: Regular rhythm. Tachycardia present.     Pulses: Normal pulses.     Heart sounds: Normal heart sounds.  Pulmonary:     Effort: Tachypnea and respiratory distress present.     Breath sounds: Examination of the right-upper field reveals rales. Examination of the left-upper field reveals rales. Examination of the right-middle field reveals rales. Examination of the left-middle field reveals rales. Examination of the right-lower field reveals rales. Examination of the left-lower field reveals rales. Rales present.  Abdominal:     General: Bowel sounds are normal. There is no distension.     Palpations: Abdomen is soft. There is no hepatomegaly, splenomegaly or mass.     Tenderness: There is no abdominal tenderness. There is no guarding or rebound.     Hernia: No hernia is present.  Musculoskeletal:     Right lower leg: No edema.     Left lower leg: No edema.  Skin:    General: Skin is warm.  Neurological:     General: No focal deficit present.     Mental Status:  She is alert and oriented to person, place, and time.     Cranial Nerves: Cranial nerves 2-12 are  intact.     Motor: Motor function is intact.  Psychiatric:        Attention and Perception: Attention normal.        Mood and Affect: Mood normal.        Speech: Speech normal.        Behavior: Behavior normal. Behavior is cooperative.        Cognition and Memory: Cognition normal.     Data Reviewed: Recent Results (from the past 2160 hour(s))  Basic metabolic panel     Status: Abnormal   Collection Time: 05/09/21 10:32 AM  Result Value Ref Range   Sodium 140 135 - 145 mmol/L   Potassium 4.0 3.5 - 5.1 mmol/L   Chloride 103 98 - 111 mmol/L   CO2 27 22 - 32 mmol/L   Glucose, Bld 96 70 - 99 mg/dL    Comment: Glucose reference range applies only to samples taken after fasting for at least 8 hours.   BUN 19 8 - 23 mg/dL   Creatinine, Ser 1.01 (H) 0.44 - 1.00 mg/dL   Calcium 9.9 8.9 - 10.3 mg/dL   GFR, Estimated 56 (L) >60 mL/min    Comment: (NOTE) Calculated using the CKD-EPI Creatinine Equation (2021)    Anion gap 10 5 - 15    Comment: Performed at [facility], [address] [place], [place] [address]  CBC     Status: Abnormal   Collection Time: 05/09/21 10:32 AM  Result Value Ref Range   WBC 12.4 (H) 4.0 - 10.5 K/uL   RBC 3.36 (L) 3.87 - 5.11 MIL/uL   Hemoglobin 10.7 (L) 12.0 - 15.0 g/dL   HCT 34.7 (L) 36.0 - 46.0 %   MCV 103.3 (H) 80.0 - 100.0 fL   MCH 31.8 26.0 - 34.0 pg   MCHC 30.8 30.0 - 36.0 g/dL   RDW 13.9 11.5 - 15.5 %   Platelets 240 150 - 400 K/uL   nRBC 0.0 0.0 - 0.2 %    Comment: Performed at [facility], [address], [place], [place] [address]  Brain natriuretic peptide     Status: Abnormal   Collection Time: 05/09/21 10:32 AM  Result Value Ref Range   B Natriuretic Peptide 234.9 (H) 0.0 - 100.0 pg/mL    Comment: Performed at [facility], [address] [place], [place] [address]  Magnesium     Status: None   Collection Time: 05/09/21 10:32 AM  Result Value Ref Range   Magnesium 1.8 1.7 - 2.4 mg/dL    Comment:  Performed at [facility], [address] [place], [place] [address]  TSH     Status: None   Collection Time: 05/09/21 10:32 AM  Result Value Ref Range   TSH 1.120 0.350 - 4.500 uIU/mL    Comment: Performed by a 3rd Generation assay with a functional sensitivity of <=0.01 uIU/mL. Performed at [facility], [address] [place],  [address]   T4, free     Status: None   Collection Time: 05/09/21 10:32 AM  Result Value Ref Range   Free T4 0.94 0.61 - 1.12 ng/dL    Comment: (NOTE) Biotin ingestion may interfere with free T4 tests. If the results are inconsistent with the TSH level, previous test results, or the clinical presentation, then consider biotin interference. If needed, order repeat testing after  stopping biotin. Performed at [facility], [place], [place] [address]   Troponin I (High Sensitivity)     Status: Abnormal   Collection Time: 05/09/21 10:48 AM  Result Value Ref Range   Troponin I (High Sensitivity) 28 (H) <18 ng/L    Comment: (NOTE) Elevated high sensitivity troponin I (hsTnI) values and significant  changes across serial measurements may suggest ACS but many other  chronic and acute conditions are known to elevate hsTnI results.  Refer to the "Links" section for chest pain algorithms and additional  guidance. Performed at [facility], [address] [place], [place] [address]   Resp Panel by RT-PCR (Flu A&B, Covid) Nasopharyngeal Swab     Status: None   Collection Time: 05/09/21 10:57 AM   Specimen: Nasopharyngeal Swab; Nasopharyngeal(NP) swabs in vial transport medium  Result Value Ref Range   SARS Coronavirus 2 by RT PCR NEGATIVE NEGATIVE    Comment: (NOTE) SARS-CoV-2 target nucleic acids are NOT DETECTED.  The SARS-CoV-2 RNA is generally detectable in upper respiratory specimens during the acute phase of infection. The lowest concentration of SARS-CoV-2 viral copies this assay can  detect is 138 copies/mL. A negative result does not preclude SARS-Cov-2 infection and should not be used as the sole basis for treatment or other patient management decisions. A negative result may occur with  improper specimen collection/handling, submission of specimen other than nasopharyngeal swab, presence of viral mutation(s) within the areas targeted by this assay, and inadequate number of viral copies(<138 copies/mL). A negative result must be combined with clinical observations, patient history, and epidemiological information. The expected result is Negative.  Fact Sheet for Patients:  EntrepreneurPulse.com.au  Fact Sheet for Healthcare Providers:  IncredibleEmployment.be  This test is no t yet approved or cleared by the [place] FDA and  has been authorized for detection and/or diagnosis of SARS-CoV-2 by FDA under an Emergency Use Authorization (EUA). This EUA will remain  in effect (meaning this test can be used) for the duration of the COVID-19 declaration under Section 564(b)(1) of the Act, 21 U.S.C.section 360bbb-3(b)(1), unless the authorization is terminated  or revoked sooner.       Influenza A by PCR NEGATIVE NEGATIVE   Influenza B by PCR NEGATIVE NEGATIVE    Comment: (NOTE) The Xpert Xpress SARS-CoV-2/FLU/RSV plus assay is intended as an aid in the diagnosis of influenza from Nasopharyngeal swab specimens and should not be used as a sole basis for treatment. Nasal washings and aspirates are unacceptable for Xpert Xpress SARS-CoV-2/FLU/RSV testing.  Fact Sheet for Patients: EntrepreneurPulse.com.au  Fact Sheet for Healthcare Providers: IncredibleEmployment.be  This test is not yet approved or cleared by the [place] FDA and has been authorized for detection and/or diagnosis of SARS-CoV-2 by FDA under an Emergency Use Authorization (EUA). This EUA will remain in effect  (meaning this test can be used) for the duration of the COVID-19 declaration under Section 564(b)(1) of the Act, 21 U.S.C. section 360bbb-3(b)(1), unless the authorization is terminated or revoked.  Performed at [facility], [address] [place], [place] [address]   Urinalysis, Routine w reflex microscopic     Status: Abnormal   Collection Time: 05/09/21 11:59 AM  Result Value Ref Range   Color, Urine STRAW (A) YELLOW   APPearance CLEAR CLEAR   Specific Gravity, Urine 1.015 1.005 - 1.030   pH 7.0 5.0 - 8.0   Glucose, UA NEGATIVE NEGATIVE mg/dL   Hgb urine dipstick TRACE (A) NEGATIVE  Bilirubin Urine NEGATIVE NEGATIVE   Ketones, ur NEGATIVE NEGATIVE mg/dL   Protein, ur NEGATIVE NEGATIVE mg/dL   Nitrite NEGATIVE NEGATIVE   Leukocytes,Ua NEGATIVE NEGATIVE    Comment: Performed at [facility], [address] [place], [place] [address]  Urine Culture     Status: Abnormal   Collection Time: 05/09/21 11:59 AM   Specimen: Urine, Clean Catch  Result Value Ref Range   Specimen Description      URINE, CLEAN CATCH Performed at [facility], [address], [place], [place] [address]    Special Requests      NONE Performed at [facility], [place], [place] [address]    Culture (A)     <10,000 COLONIES/mL INSIGNIFICANT GROWTH Performed at [facility] [address], [place], [place] [address]    Report Status 05/10/2021 FINAL   Urinalysis, Microscopic (reflex)     Status: Abnormal   Collection Time: 05/09/21 11:59 AM  Result Value Ref Range   RBC / HPF 0-5 0 - 5 RBC/hpf   WBC, UA 0-5 0 - 5 WBC/hpf   Bacteria, UA RARE (A) NONE SEEN   Squamous Epithelial / LPF 0-5 0 - 5   Mucus PRESENT     Comment: Performed at [facility], [place], [place] [address]  Troponin I (High Sensitivity)     Status: Abnormal   Collection Time: 05/09/21 12:48 PM  Result Value Ref Range   Troponin I (High  Sensitivity) 34 (H) <18 ng/L    Comment: (NOTE) Elevated high sensitivity troponin I (hsTnI) values and significant  changes across serial measurements may suggest ACS but many other  chronic and acute conditions are known to elevate hsTnI results.  Refer to the "Links" section for chest pain algorithms and additional  guidance. Performed at [facility], [address] [place],  [address]   Resp Panel by RT-PCR (Flu A&B, Covid) Nasopharyngeal Swab     Status: None   Collection Time: 06/10/21  4:46 PM   Specimen: Nasopharyngeal Swab; Nasopharyngeal(NP) swabs in vial transport medium  Result Value Ref Range   SARS Coronavirus 2 by RT PCR NEGATIVE NEGATIVE    Comment: (NOTE) SARS-CoV-2 target nucleic acids are NOT DETECTED.  The SARS-CoV-2 RNA is generally detectable in upper respiratory specimens during the acute phase of infection. The lowest concentration of SARS-CoV-2 viral copies this assay can detect is 138 copies/mL. A negative result does not preclude SARS-Cov-2 infection and should not be used as the sole basis for treatment or other patient management decisions. A negative result may occur with  improper specimen collection/handling, submission of specimen other than nasopharyngeal swab, presence of viral mutation(s) within the areas targeted by this assay, and inadequate number of viral copies(<138 copies/mL). A negative result must be combined with clinical observations, patient history, and epidemiological information. The expected result is Negative.  Fact Sheet for Patients:  EntrepreneurPulse.com.au  Fact Sheet for Healthcare Providers:  IncredibleEmployment.be  This test is no t yet approved or cleared by the [place] FDA and  has been authorized for detection and/or diagnosis of SARS-CoV-2 by FDA under an Emergency Use Authorization (EUA). This EUA will remain  in effect (meaning this test can be used)  for the duration of the COVID-19 declaration under Section 564(b)(1) of the Act, 21 U.S.C.section 360bbb-3(b)(1), unless the authorization is terminated  or revoked sooner.       Influenza A by PCR NEGATIVE NEGATIVE  Influenza B by PCR NEGATIVE NEGATIVE    Comment: (NOTE) The Xpert Xpress SARS-CoV-2/FLU/RSV plus assay is intended as an aid in the diagnosis of influenza from Nasopharyngeal swab specimens and should not be used as a sole basis for treatment. Nasal washings and aspirates are unacceptable for Xpert Xpress SARS-CoV-2/FLU/RSV testing.  Fact Sheet for Patients: EntrepreneurPulse.com.au  Fact Sheet for Healthcare Providers: IncredibleEmployment.be  This test is not yet approved or cleared by the [place] FDA and has been authorized for detection and/or diagnosis of SARS-CoV-2 by FDA under an Emergency Use Authorization (EUA). This EUA will remain in effect (meaning this test can be used) for the duration of the COVID-19 declaration under Section 564(b)(1) of the Act, 21 U.S.C. section 360bbb-3(b)(1), unless the authorization is terminated or revoked.  Performed at [facility], [address] [place], [place] [address]   Lactic acid, plasma     Status: Abnormal   Collection Time: 06/10/21  5:08 PM  Result Value Ref Range   Lactic Acid, Venous 2.7 (HH) 0.5 - 1.9 mmol/L    Comment: CRITICAL RESULT CALLED TO, READ BACK BY AND VERIFIED WITH CANDICE NUCKLES _0  06/10/21 MJU Performed at [facility], [address] [place], [place] [address]   Comprehensive metabolic panel     Status: Abnormal   Collection Time: 06/10/21  5:08 PM  Result Value Ref Range   Sodium 143 135 - 145 mmol/L   Potassium 5.0 3.5 - 5.1 mmol/L    Comment: HEMOLYSIS AT THIS LEVEL MAY AFFECT RESULT   Chloride 106 98 - 111 mmol/L   CO2 30 22 - 32 mmol/L   Glucose, Bld 216 (H) 70 - 99 mg/dL    Comment: Glucose reference range applies  only to samples taken after fasting for at least 8 hours.   BUN 26 (H) 8 - 23 mg/dL   Creatinine, Ser 1.04 (H) 0.44 - 1.00 mg/dL   Calcium 9.3 8.9 - 10.3 mg/dL   Total Protein 6.3 (L) 6.5 - 8.1 g/dL   Albumin 2.9 (L) 3.5 - 5.0 g/dL   AST 81 (H) 15 - 41 U/L    Comment: HEMOLYSIS AT THIS LEVEL MAY AFFECT RESULT   ALT 46 (H) 0 - 44 U/L   Alkaline Phosphatase 47 38 - 126 U/L   Total Bilirubin 1.0 0.3 - 1.2 mg/dL    Comment: HEMOLYSIS AT THIS LEVEL MAY AFFECT RESULT   GFR, Estimated 54 (L) >60 mL/min    Comment: (NOTE) Calculated using the CKD-EPI Creatinine Equation (2021)    Anion gap 7 5 - 15    Comment: Performed at [facility], [address] [place], [place] [address]  CBC WITH DIFFERENTIAL     Status: Abnormal   Collection Time: 06/10/21  5:08 PM  Result Value Ref Range   WBC 8.9 4.0 - 10.5 K/uL   RBC 2.95 (L) 3.87 - 5.11 MIL/uL   Hemoglobin 9.2 (L) 12.0 - 15.0 g/dL   HCT 30.4 (L) 36.0 - 46.0 %   MCV 103.1 (H) 80.0 - 100.0 fL   MCH 31.2 26.0 - 34.0 pg   MCHC 30.3 30.0 - 36.0 g/dL   RDW 14.5 11.5 - 15.5 %   Platelets 241 150 - 400 K/uL   nRBC 0.0 0.0 - 0.2 %   Neutrophils Relative % 83 %   Neutro Abs 7.3 1.7 - 7.7 K/uL   Lymphocytes Relative 11 %   Lymphs Abs 1.0 0.7 - 4.0 K/uL   Monocytes  Relative 6 %   Monocytes Absolute 0.6 0.1 - 1.0 K/uL   Eosinophils Relative 0 %   Eosinophils Absolute 0.0 0.0 - 0.5 K/uL   Basophils Relative 0 %   Basophils Absolute 0.0 0.0 - 0.1 K/uL   Immature Granulocytes 0 %   Abs Immature Granulocytes 0.04 0.00 - 0.07 K/uL    Comment: Performed at [facility], [address] [place], [place] [address]  Procalcitonin - Baseline     Status: None   Collection Time: 06/10/21  5:08 PM  Result Value Ref Range   Procalcitonin <0.10 ng/mL    Comment:        Interpretation: PCT (Procalcitonin) <= 0.5 ng/mL: Systemic infection (sepsis) is not likely. Local bacterial infection is possible. (NOTE)       Sepsis PCT Algorithm            Lower Respiratory Tract                                      Infection PCT Algorithm    ----------------------------     ----------------------------         PCT < 0.25 ng/mL                PCT < 0.10 ng/mL          Strongly encourage             Strongly discourage   discontinuation of antibiotics    initiation of antibiotics    ----------------------------     -----------------------------       PCT 0.25 - 0.50 ng/mL            PCT 0.10 - 0.25 ng/mL               OR       >80% decrease in PCT            Discourage initiation of                                            antibiotics      Encourage discontinuation           of antibiotics    ----------------------------     -----------------------------         PCT >= 0.50 ng/mL              PCT 0.26 - 0.50 ng/mL               AND        <80% decrease in PCT             Encourage initiation of                                             antibiotics       Encourage continuation           of antibiotics    ----------------------------     -----------------------------        PCT >= 0.50 ng/mL                  PCT > 0.50 ng/mL  AND         increase in PCT                  Strongly encourage                                      initiation of antibiotics    Strongly encourage escalation           of antibiotics                                     -----------------------------                                           PCT <= 0.25 ng/mL                                                 OR                                        > 80% decrease in PCT                                      Discontinue / Do not initiate                                             antibiotics  Performed at [facility], [address] [place], [place] [address]   Lactic acid, plasma     Status: None   Collection Time: 06/10/21  8:11 PM  Result Value Ref Range   Lactic Acid, Venous 1.8 0.5 - 1.9 mmol/L    Comment: Performed  at [facility], [address] [place], [place] [address]  Brain natriuretic peptide     Status: Abnormal   Collection Time: 06/10/21  8:11 PM  Result Value Ref Range   B Natriuretic Peptide 563.3 (H) 0.0 - 100.0 pg/mL    Comment: Performed at [facility], [place], [place] [address]  Troponin I (High Sensitivity)     Status: Abnormal   Collection Time: 06/10/21  8:11 PM  Result Value Ref Range   Troponin I (High Sensitivity) 67 (H) <18 ng/L    Comment: (NOTE) Elevated high sensitivity troponin I (hsTnI) values and significant  changes across serial measurements may suggest ACS but many other  chronic and acute conditions are known to elevate hsTnI results.  Refer to the "Links" section for chest pain algorithms and additional  guidance. Performed at [facility], [address] [place], [place] [address]   CBC     Status: Abnormal   Collection Time: 06/10/21  8:11 PM  Result Value Ref Range   WBC 11.0 (H) 4.0 - 10.5 K/uL   RBC 2.89 (L) 3.87 -  5.11 MIL/uL   Hemoglobin 9.1 (L) 12.0 - 15.0 g/dL   HCT 30.0 (L) 36.0 - 46.0 %   MCV 103.8 (H) 80.0 - 100.0 fL   MCH 31.5 26.0 - 34.0 pg   MCHC 30.3 30.0 - 36.0 g/dL   RDW 14.5 11.5 - 15.5 %   Platelets 211 150 - 400 K/uL   nRBC 0.0 0.0 - 0.2 %    Comment: Performed at [facility], [address] [place], [place] [address]  Creatinine, serum     Status: None   Collection Time: 06/10/21  8:11 PM  Result Value Ref Range   Creatinine, Ser 0.91 0.44 - 1.00 mg/dL   GFR, Estimated >60 >60 mL/min    Comment: (NOTE) Calculated using the CKD-EPI Creatinine Equation (2021) Performed at [facility], [address] [place], [place] [address]   Magnesium     Status: Abnormal   Collection Time: 06/10/21  8:11 PM  Result Value Ref Range   Magnesium 1.6 (L) 1.7 - 2.4 mg/dL    Comment: Performed at [facility], [address] [place], [place] [address]  MRSA Next  Gen by PCR, Nasal     Status: None   Collection Time: 06/10/21  9:35 PM   Specimen: Nasal Mucosa; Nasal Swab  Result Value Ref Range   MRSA by PCR Next Gen NOT DETECTED NOT DETECTED    Comment: (NOTE) The GeneXpert MRSA Assay (FDA approved for NASAL specimens only), is one component of a comprehensive MRSA colonization surveillance program. It is not intended to diagnose MRSA infection nor to guide or monitor treatment for MRSA infections. Test performance is not FDA approved in patients less than 20 years old. Performed at [facility], [place], [place] [address]   Troponin I (High Sensitivity)     Status: Abnormal   Collection Time: 06/10/21 10:22 PM  Result Value Ref Range   Troponin I (High Sensitivity) 61 (H) <18 ng/L    Comment: (NOTE) Elevated high sensitivity troponin I (hsTnI) values and significant  changes across serial measurements may suggest ACS but many other  chronic and acute conditions are known to elevate hsTnI results.  Refer to the "Links" section for chest pain algorithms and additional  guidance. Performed at [facility], [address] [place], [place] [address]   Protime-INR     Status: None   Collection Time: 06/10/21 10:49 PM  Result Value Ref Range   Prothrombin Time 14.2 11.4 - 15.2 seconds   INR 1.1 0.8 - 1.2    Comment: (NOTE) INR goal varies based on device and disease states. Performed at [facility], [address] [place], [place] [address]   APTT     Status: None   Collection Time: 06/10/21 10:49 PM  Result Value Ref Range   aPTT 26 24 - 36 seconds    Comment: Performed at [facility], [address] [place], [place] [address]  Type and screen     Status: None   Collection Time: 06/10/21 10:49 PM  Result Value Ref Range   ABO/RH(D) O POS    Antibody Screen NEG    Sample Expiration      06/13/2021,2359 Performed at [facility], [address] [place],  [place] [address]   Urinalysis, Complete w Microscopic     Status: Abnormal   Collection Time: 06/10/21 11:26 PM  Result Value Ref Range   Color, Urine YELLOW (A) YELLOW  APPearance CLEAR (A) CLEAR   Specific Gravity, Urine 1.027 1.005 - 1.030   pH 6.0 5.0 - 8.0   Glucose, UA 50 (A) NEGATIVE mg/dL   Hgb urine dipstick NEGATIVE NEGATIVE   Bilirubin Urine NEGATIVE NEGATIVE   Ketones, ur NEGATIVE NEGATIVE mg/dL   Protein, ur 30 (A) NEGATIVE mg/dL   Nitrite NEGATIVE NEGATIVE   Leukocytes,Ua NEGATIVE NEGATIVE   RBC / HPF 0-5 0 - 5 RBC/hpf   WBC, UA 0-5 0 - 5 WBC/hpf   Bacteria, UA NONE SEEN NONE SEEN   Squamous Epithelial / LPF 0-5 0 - 5    Comment: Performed at [facility], [address] [place], [place] [address]  Glucose, capillary     Status: Abnormal   Collection Time: 06/10/21 11:55 PM  Result Value Ref Range   Glucose-Capillary 156 (H) 70 - 99 mg/dL    Comment: Glucose reference range applies only to samples taken after fasting for at least 8 hours.   >      > Chest xray: Chronic interstitial lung disease, similar in appearance to prior. No evidence of superimposed acute process. > CT chest : IMPRESSION: 1. No evidence of pulmonary embolus. 2. 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 3. Increased interlobular septal thickening and ground-glass opacities within the mid and upper lung zones, which could reflect superimposed edema. 4.  Aortic Atherosclerosis (ICD10-I70.0).    Assessment and Plan: * Acute on chronic respiratory failure with hypoxemia (HCC) Acute on chronic respiratory failure with hypoxemia patient on baseline 4 L nasal cannula, escalated to nonrebreather with O2 sats at 85%.  Attribute to congestive heart failure exacerbation. Supportive care with supplemental oxygen and NIPPV as needed. Will admit patient to stepdown unit for continuous cardiac monitoring and continuous O2 monitoring.   Plan of care will be  to treat the underlying cause with monitoring of electrolytes and kidney function.   Acute on chronic combined systolic and diastolic CHF (congestive heart failure) (HCC) Patient's previous echocardiogram showed an ejection fraction of 45% and combined systolic and diastolic congestive heart failure.  Order requested 2D echocardiogram. We will start patient on Lasix 20 mg IV 3 times daily for the next 2 days with close monitoring of blood pressure.  Patient may need vasopressor therapy to diurese. Cardiology consult for a.m. team.  Sepsis [facility]) Patient met sepsis criteria on initial evaluation. Do not suspect an infectious source. We will follow cultures.  CKD (chronic kidney disease) stage 3, GFR 30-59 ml/min (HCC) Lab Results  Component Value Date   CREATININE 0.91 06/10/2021   CREATININE 1.04 (H) 06/10/2021   CREATININE 1.01 (H) 05/09/2021  Kidney function is stable, will monitor and avoid nephrotoxic agents contrast and studies.   Diabetes type 2, controlled ([place]) Currently we will keep patient on sliding scale insulin regimen and hold metformin. Patient is also currently n.p.o. Plan Accu-Cheks every 6 hours. We will obtain an A1c last A1c was 8.1 about 6 months ago.  Hypertension Blood pressure (!) 89/57, pulse (!) 124, temperature (!) 97.4 F (36.3 C), temperature source Oral, resp. rate (!) 42, height _0  (1.651 m), weight 63.9 kg, SpO2 98 %. Home regimen of metformin, I will hold patient's metoprolol, to allow room for diuresis. Follow magnesium.   Coronary artery disease Troponins today are elevated at 67 and 61, attribute to cardiac strain. We will obtain a 2D echocardiogram to assess for ejection fraction, wall motion abnormalities and any valvular abnormalities.  Cardiology consult per a.m. team.  Anemia CBC Latest Ref Rng & Units 06/10/2021 06/10/2021 05/09/2021  WBC 4.0 - 10.5 K/uL 11.0(H) 8.9 12.4(H)  Hemoglobin 12.0 - 15.0 g/dL 9.1(L) 9.2(L) 10.7(L)  Hematocrit  36.0 - 46.0 % 30.0(L) 30.4(L) 34.7(L)  Platelets 150 - 400 K/uL 211 241 240  Patient has been chronically anemic for several years, which was increased demand on her already weak heart. Low threshold to transfuse as deemed appropriate.   Pressure injury of skin Per nurses report patient has pressure injury of the skin. We will continue with skin care.    Advance Care Planning:   Code Status: Full Code    Consults:  None   Family Communication:  Creola Corn (Daughter)  [phone] (Home Phone)  Severity of Illness: The appropriate patient status for this patient is INPATIENT. Inpatient status is judged to be reasonable and necessary in order to provide the required intensity of service to ensure the patient's safety. The patient's presenting symptoms, physical exam findings, and initial radiographic and laboratory data in the context of their chronic comorbidities is felt to place them at high risk for further clinical deterioration. Furthermore, it is not anticipated that the patient will be medically stable for discharge from the hospital within 2 midnights of admission.   * I certify that at the point of admission it is my clinical judgment that the patient will require inpatient hospital care spanning beyond 2 midnights from the point of admission due to high intensity of service, high risk for further deterioration and high frequency of surveillance required.*  Author: Para Skeans, MD 06/11/2021 1:28 AM  For on call review [email or web address].

## 2021-06-11 ENCOUNTER — Inpatient Hospital Stay (HOSPITAL_COMMUNITY)
Admit: 2021-06-11 | Discharge: 2021-06-11 | Disposition: A | Discharge status: Home / Self Care | Payer: Medicare HMO | Attending: Internal Medicine | Admitting: Internal Medicine

## 2021-06-11 DIAGNOSIS — I5043 Acute on chronic combined systolic (congestive) and diastolic (congestive) heart failure: Secondary | ICD-10-CM

## 2021-06-11 DIAGNOSIS — N1831 Chronic kidney disease, stage 3a: Secondary | ICD-10-CM

## 2021-06-11 DIAGNOSIS — I5021 Acute systolic (congestive) heart failure: Secondary | ICD-10-CM | POA: Diagnosis not present

## 2021-06-11 DIAGNOSIS — D631 Anemia in chronic kidney disease: Secondary | ICD-10-CM

## 2021-06-11 DIAGNOSIS — I959 Hypotension, unspecified: Secondary | ICD-10-CM

## 2021-06-11 DIAGNOSIS — L89302 Pressure ulcer of unspecified buttock, stage 2: Secondary | ICD-10-CM

## 2021-06-11 DIAGNOSIS — J849 Interstitial pulmonary disease, unspecified: Secondary | ICD-10-CM | POA: Diagnosis present

## 2021-06-11 DIAGNOSIS — J9621 Acute and chronic respiratory failure with hypoxia: Secondary | ICD-10-CM | POA: Diagnosis not present

## 2021-06-11 DIAGNOSIS — E1165 Type 2 diabetes mellitus with hyperglycemia: Secondary | ICD-10-CM

## 2021-06-11 LAB — CBC
HCT: 31.1 % — ABNORMAL LOW (ref 36.0–46.0)
Hemoglobin: 9.5 g/dL — ABNORMAL LOW (ref 12.0–15.0)
MCH: 31 pg (ref 26.0–34.0)
MCHC: 30.5 g/dL (ref 30.0–36.0)
MCV: 101.6 fL — ABNORMAL HIGH (ref 80.0–100.0)
Platelets: 237 10*3/uL (ref 150–400)
RBC: 3.06 MIL/uL — ABNORMAL LOW (ref 3.87–5.11)
RDW: 14.3 % (ref 11.5–15.5)
WBC: 12 10*3/uL — ABNORMAL HIGH (ref 4.0–10.5)
nRBC: 0 % (ref 0.0–0.2)

## 2021-06-11 LAB — BLOOD CULTURE ID PANEL (REFLEXED) - BCID2

## 2021-06-11 LAB — COMPREHENSIVE METABOLIC PANEL
ALT: 74 U/L — ABNORMAL HIGH (ref 0–44)
AST: 94 U/L — ABNORMAL HIGH (ref 15–41)
Albumin: 3.1 g/dL — ABNORMAL LOW (ref 3.5–5.0)
Alkaline Phosphatase: 57 U/L (ref 38–126)
Anion gap: 7 (ref 5–15)
BUN: 23 mg/dL (ref 8–23)
CO2: 33 mmol/L — ABNORMAL HIGH (ref 22–32)
Calcium: 9.4 mg/dL (ref 8.9–10.3)
Chloride: 104 mmol/L (ref 98–111)
Creatinine, Ser: 0.81 mg/dL (ref 0.44–1.00)
GFR, Estimated: >60 mL/min (ref >60)
Glucose, Bld: 138 mg/dL — ABNORMAL HIGH (ref 70–99)
Potassium: 3.9 mmol/L (ref 3.5–5.1)
Sodium: 144 mmol/L (ref 135–145)
Total Bilirubin: 0.7 mg/dL (ref 0.3–1.2)
Total Protein: 6.4 g/dL — ABNORMAL LOW (ref 6.5–8.1)

## 2021-06-11 LAB — BASIC METABOLIC PANEL
Anion gap: 8 (ref 5–15)
BUN: 25 mg/dL — ABNORMAL HIGH (ref 8–23)
CO2: 27 mmol/L (ref 22–32)
Calcium: 8.8 mg/dL — ABNORMAL LOW (ref 8.9–10.3)
Chloride: 98 mmol/L (ref 98–111)
Creatinine, Ser: 1.19 mg/dL — ABNORMAL HIGH (ref 0.44–1.00)
GFR, Estimated: 46 mL/min — ABNORMAL LOW (ref >60)
Glucose, Bld: 341 mg/dL — ABNORMAL HIGH (ref 70–99)
Potassium: 4.1 mmol/L (ref 3.5–5.1)
Sodium: 133 mmol/L — ABNORMAL LOW (ref 135–145)

## 2021-06-11 LAB — MAGNESIUM: Magnesium: 3.7 mg/dL — ABNORMAL HIGH (ref 1.7–2.4)

## 2021-06-11 LAB — ECHOCARDIOGRAM COMPLETE
AR max vel: 1.48 cm2
AV Peak grad: 12.7 mmHg
Ao pk vel: 1.78 m/s
Area-P 1/2: 5.5 cm2
Calc EF: 35.5 %
Height: 65 in
S' Lateral: 3.91 cm
Single Plane A2C EF: 38.2 %
Single Plane A4C EF: 34.3 %
Weight: 2236.35 oz

## 2021-06-11 LAB — GLUCOSE, CAPILLARY
Glucose-Capillary: 75 mg/dL (ref 70–99)
Glucose-Capillary: 214 mg/dL — ABNORMAL HIGH (ref 70–99)
Glucose-Capillary: 406 mg/dL — ABNORMAL HIGH (ref 70–99)
Glucose-Capillary: 179 mg/dL — ABNORMAL HIGH (ref 70–99)
Glucose-Capillary: 177 mg/dL — ABNORMAL HIGH (ref 70–99)

## 2021-06-11 LAB — MRSA NEXT GEN BY PCR, NASAL: MRSA by PCR Next Gen: NOT DETECTED

## 2021-06-11 LAB — BRAIN NATRIURETIC PEPTIDE: B Natriuretic Peptide: 662.9 pg/mL — ABNORMAL HIGH (ref 0.0–100.0)

## 2021-06-11 LAB — PHOSPHORUS: Phosphorus: 3 mg/dL (ref 2.5–4.6)

## 2021-06-11 LAB — PROCALCITONIN: Procalcitonin: 0.1 ng/mL

## 2021-06-11 MED ORDER — PHENYLEPHRINE HCL-NACL 20-0.9 MG/250ML-% IV SOLN
25.0000 ug/min | INTRAVENOUS | Status: DC
  Filled 2021-06-11: qty 250

## 2021-06-11 MED ORDER — PREDNISONE 10 MG PO TABS: 10.0000 mg | ORAL_TABLET | Freq: Every day | ORAL | Status: DC

## 2021-06-11 MED ORDER — METOPROLOL TARTRATE 5 MG/5ML IV SOLN: 2.5000 mg | Freq: Once | INTRAVENOUS | Status: AC

## 2021-06-11 MED ORDER — DM-GUAIFENESIN ER 30-600 MG PO TB12
1.0000 | ORAL_TABLET | Freq: Two times a day (BID) | ORAL | Status: DC
Start: 2021-06-11 — End: 2021-06-17
  Administered 2021-06-11 – 2021-06-17 (×14): 1 via ORAL
  Filled 2021-06-11 (×14): qty 1

## 2021-06-11 MED ORDER — METOPROLOL TARTRATE 25 MG PO TABS: 12.5000 mg | ORAL_TABLET | Freq: Two times a day (BID) | ORAL | Status: DC

## 2021-06-11 MED ORDER — ORAL CARE MOUTH RINSE
15.0000 mL | Freq: Two times a day (BID) | OROMUCOSAL | Status: DC
  Administered 2021-06-11 – 2021-06-17 (×12): 15 mL via OROMUCOSAL

## 2021-06-11 MED ORDER — HYDROXYZINE HCL 25 MG PO TABS
25.0000 mg | ORAL_TABLET | Freq: Three times a day (TID) | ORAL | Status: DC | PRN
  Administered 2021-06-11 – 2021-06-12 (×5): 25 mg via ORAL
  Filled 2021-06-11 (×5): qty 1

## 2021-06-11 MED ORDER — PREDNISONE 20 MG PO TABS
30.0000 mg | ORAL_TABLET | Freq: Every day | ORAL | Status: DC
  Administered 2021-06-11 – 2021-06-12 (×2): 30 mg via ORAL
  Filled 2021-06-11 (×2): qty 3

## 2021-06-11 MED ORDER — SODIUM CHLORIDE 0.9 % IV SOLN
250.0000 mL | INTRAVENOUS | Status: DC
Start: 2021-06-11 — End: 2021-06-17

## 2021-06-11 MED ORDER — PREDNISONE 20 MG PO TABS: 20.0000 mg | ORAL_TABLET | Freq: Every day | ORAL | Status: DC

## 2021-06-11 MED ORDER — INSULIN GLARGINE-YFGN 100 UNIT/ML ~~LOC~~ SOLN
10.0000 [IU] | Freq: Every day | SUBCUTANEOUS | Status: DC
  Administered 2021-06-11 – 2021-06-12 (×2): 10 [IU] via SUBCUTANEOUS
  Filled 2021-06-11 (×3): qty 0.1

## 2021-06-11 MED ORDER — IPRATROPIUM-ALBUTEROL 0.5-2.5 (3) MG/3ML IN SOLN
3.0000 mL | Freq: Three times a day (TID) | RESPIRATORY_TRACT | Status: DC
  Administered 2021-06-11 – 2021-06-17 (×17): 3 mL via RESPIRATORY_TRACT
  Filled 2021-06-11 (×19): qty 3

## 2021-06-11 MED ORDER — AZITHROMYCIN 250 MG PO TABS
250.0000 mg | ORAL_TABLET | Freq: Every day | ORAL | Status: DC
  Administered 2021-06-11 – 2021-06-15 (×5): 250 mg via ORAL
  Filled 2021-06-11 (×6): qty 1

## 2021-06-11 MED ORDER — CHLORHEXIDINE GLUCONATE CLOTH 2 % EX PADS
6.0000 | MEDICATED_PAD | Freq: Every day | CUTANEOUS | Status: DC
  Administered 2021-06-12 – 2021-06-17 (×6): 6 via TOPICAL

## 2021-06-11 MED ORDER — METOPROLOL TARTRATE 5 MG/5ML IV SOLN
INTRAVENOUS | Status: AC
  Administered 2021-06-11: 2.5 mg via INTRAVENOUS
  Filled 2021-06-11: qty 5

## 2021-06-11 MED ORDER — IPRATROPIUM-ALBUTEROL 0.5-2.5 (3) MG/3ML IN SOLN
3.0000 mL | RESPIRATORY_TRACT | Status: DC | PRN
  Filled 2021-06-11: qty 3

## 2021-06-11 MED ORDER — METOPROLOL TARTRATE 25 MG PO TABS
12.5000 mg | ORAL_TABLET | Freq: Two times a day (BID) | ORAL | Status: DC
Start: 2021-06-11 — End: 2021-06-11

## 2021-06-11 NOTE — Progress Notes (Signed)
?Progress Note ? ? ?Patient: Sierra Guzman [id number] DOB: 14-Oct-1940 DOA: 06/10/2021     1 ?DOS: the patient was seen and examined on 06/11/2021 ?  ?Assessment and Plan: ?* Acute on chronic respiratory failure with hypoxemia (HCC) ?Acute on chronic respiratory failure with hypoxemia.  The patient had respiratory distress on presentation requiring BiPAP.  Initial pulse ox 85%.  The patient does back down to her baseline 4 L of oxygen.  ? ? ?Acute on chronic combined systolic and diastolic CHF (congestive heart failure) ([place]) ?Patient on Lasix 20 mg every 8 hours.  Last echocardiogram showed an EF of 45 to 50%.  Repeat echocardiogram still pending.  Limited with hypotension on other medications at this point. ? ?Interstitial lung disease ([place]) ?Patient initially placed on antibiotics for suspected sepsis but this was ruled out.  Chest x-ray does not show pneumonia.  Will discontinue cefepime and continue Zithromax. Steroid switch from IV over to oral.  Continue nebulizers. ? ?CKD (chronic kidney disease) stage 3, GFR 30-59 ml/min (HCC) ?Patient's creatinine actually better today at 0.81 with a GFR greater than 60. ? ?Uncontrolled type 2 diabetes mellitus with hyperglycemia, without long-term current use of insulin ([place]) ?Sugar above 400 after starting diet.  Started low-dose Lantus insulin.  Continue sliding scale. ? ?Hypotension ?The patient is very sensitive to medications.  Received metoprolol yesterday which dropped her blood pressure.  Continue to monitor off medications.  Blood pressure in the low 100s. ? ?Anemia ?Suspect anemia of chronic disease with last ferritin being elevated.  Last hemoglobin 9.5 ? ? ?Hypomagnesemia ?Replaced yesterday. ? ?Pressure injury of skin ?Stage II on sacrum and coccyx.  See full description below, present on admission. ? ?Coronary artery disease ?Borderline troponins likely demand ischemia with acute hypoxic respiratory failure ? ? ?We will get PT evaluation. ? ? ? ? ?Subjective:  Patient feeling much better today.  No complaints of chest pain or shortness of breath.  Daughter states that she is very sensitive to medications and her blood pressure goes low.  Came in with shortness of breath and admitted with CHF exacerbation. ? ?Physical Exam: ?Vitals:  ? 06/11/21 0900 06/11/21 0915 06/11/21 1009 06/11/21 1100  ?BP: 98/63 109/68 104/62 100/68  ?Pulse: (!) 55 79 (!) 103 67  ?Resp: (!) 25 (!) 24 19 (!) 27  ?Temp:      ?TempSrc:      ?SpO2: 100% 100% 100% 92%  ?Weight:      ?Height:      ? ?Physical Exam ?HENT:  ?   Head: Normocephalic.  ?   Mouth/Throat:  ?   Pharynx: No oropharyngeal exudate.  ?Eyes:  ?   General: Lids are normal.  ?   Conjunctiva/sclera: Conjunctivae normal.  ?Cardiovascular:  ?   Rate and Rhythm: Regular rhythm. Tachycardia present.  ?   Heart sounds: Normal heart sounds, S1 normal and S2 normal.  ?Pulmonary:  ?   Breath sounds: Examination of the right-lower field reveals decreased breath sounds and rhonchi. Examination of the left-lower field reveals decreased breath sounds and rhonchi. Decreased breath sounds and rhonchi present. No wheezing or rales.  ?Abdominal:  ?   Palpations: Abdomen is soft.  ?   Tenderness: There is no abdominal tenderness.  ?Musculoskeletal:  ?   Right lower leg: No swelling.  ?   Left lower leg: No swelling.  ?Skin: ?   General: Skin is warm.  ?   Findings: No rash.  ?Neurological:  ?   Mental Status:  She is alert and oriented to person, place, and time.  ?  ? ?Data Reviewed: ?Creatinine 0.81, AST and ALT elevated at 94 and 74 respectively.  Last ferritin 191, hemoglobin 9.5.  Chest x-ray reviewed and no pneumonia seen in interstitial lung disease. ? ?Family Communication: Spoke with patient's daughter on the phone while is in the room with the patient ? ?Disposition: ?Status is: Inpatient ?Remains inpatient appropriate because: Came in with respiratory distress and required higher level of oxygenation.  Now down to her baseline 4 L. ? ?Planned  Discharge Destination: Home ? ?Author: ?Loletha Grayer, MD ?06/11/2021 1:18 PM ? ?For on call review [email or web address].  ? ?

## 2021-06-11 NOTE — Assessment & Plan Note (Addendum)
This echo showing EF of 30 to 35%.  Continue Toprol-XL low-dose.  With heart rates elevated cardiology added low-dose digoxin.  Heart rate still bouncing around up as high as 150.  Jardiance also added.  Conservative management as per cardiology. Limited with medication secondary to hypotension.  Restarted on p.o. Lasix and metoprolol, digoxin and Jardiance added. ?

## 2021-06-11 NOTE — Assessment & Plan Note (Addendum)
Borderline troponins likely demand ischemia with acute hypoxic respiratory failure ?

## 2021-06-11 NOTE — Assessment & Plan Note (Deleted)
Blood pressure (!) 89/57, pulse (!) 124, temperature (!) 97.4 ?F (36.3 ?C), temperature source Oral, resp. rate (!) 42, height 5\' 5"  (1.651 m), weight 63.9 kg, SpO2 98 %. ?Home regimen of metformin, I will hold patient's metoprolol, to allow room for diuresis. ?Follow magnesium. ? ?

## 2021-06-11 NOTE — Plan of Care (Signed)
?  Problem: Activity: ?Goal: Capacity to carry out activities will improve ?Outcome: Progressing ?  ?Problem: Education: ?Goal: Knowledge of General Education information will improve ?Description: Including pain rating scale, medication(s)/side effects and non-pharmacologic comfort measures ?Outcome: Progressing ?  ?Problem: Health Behavior/Discharge Planning: ?Goal: Ability to manage health-related needs will improve ?Outcome: Progressing ?  ?Problem: Clinical Measurements: ?Goal: Ability to maintain clinical measurements within normal limits will improve ?Outcome: Progressing ?Goal: Diagnostic test results will improve ?Outcome: Progressing ?Goal: Respiratory complications will improve ?Outcome: Progressing ?Goal: Cardiovascular complication will be avoided ?Outcome: Progressing ?  ?Problem: Coping: ?Goal: Level of anxiety will decrease ?Outcome: Progressing ?  ?Problem: Elimination: ?Goal: Will not experience complications related to urinary retention ?Outcome: Progressing ?  ?Problem: Pain Managment: ?Goal: General experience of comfort will improve ?Outcome: Progressing ?  ?Problem: Safety: ?Goal: Ability to remain free from injury will improve ?Outcome: Progressing ?  ?Problem: Skin Integrity: ?Goal: Risk for impaired skin integrity will decrease ?Outcome: Progressing ?  ?Problem: Education: ?Goal: Ability to demonstrate management of disease process will improve ?Outcome: Progressing ?Goal: Ability to verbalize understanding of medication therapies will improve ?Outcome: Progressing ?  ?

## 2021-06-11 NOTE — Progress Notes (Signed)
An USGPIV (ultrasound guided PIV) has been placed for short-term vasopressor infusion. A correctly placed ivWatch must be used when administering Vasopressors. Should this treatment be needed beyond 72 hours, central line access should be obtained.  It will be the responsibility of the bedside nurse to follow best practice to prevent extravasations.   ?

## 2021-06-11 NOTE — Assessment & Plan Note (Deleted)
Patient met sepsis criteria on initial evaluation. ?Do not suspect an infectious source. ?We will follow cultures. ?

## 2021-06-11 NOTE — Assessment & Plan Note (Addendum)
Started Jardiance.  Continue sliding scale insulin and NovoLog while on steroids.  A1c of 5.9. ?

## 2021-06-11 NOTE — Assessment & Plan Note (Addendum)
Replaced during the hospital course 

## 2021-06-11 NOTE — Assessment & Plan Note (Addendum)
Stage II on sacrum and coccyx.  See full description below, present on admission. ?

## 2021-06-11 NOTE — Assessment & Plan Note (Addendum)
Anemia of chronic disease with last ferritin being elevated.  Last hemoglobin 10.4 ? ?

## 2021-06-11 NOTE — Assessment & Plan Note (Addendum)
Patient tolerating low-dose Toprol. ?

## 2021-06-11 NOTE — Assessment & Plan Note (Addendum)
Acute on chronic respiratory failure with hypoxemia.  The patient had respiratory distress on presentation requiring BiPAP.  Initial pulse ox 85% on oxygen..  The patient states that she wears 2 L oxygen at rest and 4 L with ambulation.  Patient with oxygen saturations of 100% on 4 L. ? ?

## 2021-06-11 NOTE — Assessment & Plan Note (Addendum)
At baseline.  Creatinine today at 1.01 with GFR of 56. ?

## 2021-06-11 NOTE — Assessment & Plan Note (Addendum)
Sepsis ruled out.  I think this is interstitial lung disease exacerbation also.  During hospitalization on Solu-Medrol, changed over to a prednisone taper that we will continue as outpatient at 40 mg and decrease by 5 mg weekly.  Also placed on CellCept. ?

## 2021-06-11 NOTE — Progress Notes (Signed)
*  PRELIMINARY RESULTS* ?Echocardiogram ?2D Echocardiogram has been performed. ? ?Sierra Guzman ?06/11/2021, 4:02 PM ?

## 2021-06-12 DIAGNOSIS — D638 Anemia in other chronic diseases classified elsewhere: Secondary | ICD-10-CM

## 2021-06-12 DIAGNOSIS — R7989 Other specified abnormal findings of blood chemistry: Secondary | ICD-10-CM

## 2021-06-12 DIAGNOSIS — J9621 Acute and chronic respiratory failure with hypoxia: Secondary | ICD-10-CM | POA: Diagnosis not present

## 2021-06-12 DIAGNOSIS — N1831 Chronic kidney disease, stage 3a: Secondary | ICD-10-CM | POA: Diagnosis not present

## 2021-06-12 DIAGNOSIS — J849 Interstitial pulmonary disease, unspecified: Secondary | ICD-10-CM | POA: Diagnosis not present

## 2021-06-12 DIAGNOSIS — I5043 Acute on chronic combined systolic (congestive) and diastolic (congestive) heart failure: Secondary | ICD-10-CM | POA: Diagnosis not present

## 2021-06-12 LAB — COMPREHENSIVE METABOLIC PANEL
ALT: 91 U/L — ABNORMAL HIGH (ref 0–44)
AST: 155 U/L — ABNORMAL HIGH (ref 15–41)
Albumin: 3 g/dL — ABNORMAL LOW (ref 3.5–5.0)
Alkaline Phosphatase: 57 U/L (ref 38–126)
Anion gap: 8 (ref 5–15)
BUN: 29 mg/dL — ABNORMAL HIGH (ref 8–23)
CO2: 33 mmol/L — ABNORMAL HIGH (ref 22–32)
Calcium: 9.5 mg/dL (ref 8.9–10.3)
Chloride: 99 mmol/L (ref 98–111)
Creatinine, Ser: 1.03 mg/dL — ABNORMAL HIGH (ref 0.44–1.00)
GFR, Estimated: 55 mL/min — ABNORMAL LOW (ref >60)
Glucose, Bld: 117 mg/dL — ABNORMAL HIGH (ref 70–99)
Potassium: 4.1 mmol/L (ref 3.5–5.1)
Sodium: 140 mmol/L (ref 135–145)
Total Bilirubin: 0.4 mg/dL (ref 0.3–1.2)
Total Protein: 6.3 g/dL — ABNORMAL LOW (ref 6.5–8.1)

## 2021-06-12 LAB — CRYPTOCOCCAL ANTIGEN: Crypto Ag: NEGATIVE

## 2021-06-12 LAB — GLUCOSE, CAPILLARY
Glucose-Capillary: 102 mg/dL — ABNORMAL HIGH (ref 70–99)
Glucose-Capillary: 114 mg/dL — ABNORMAL HIGH (ref 70–99)
Glucose-Capillary: 149 mg/dL — ABNORMAL HIGH (ref 70–99)
Glucose-Capillary: 232 mg/dL — ABNORMAL HIGH (ref 70–99)
Glucose-Capillary: 252 mg/dL — ABNORMAL HIGH (ref 70–99)
Glucose-Capillary: 336 mg/dL — ABNORMAL HIGH (ref 70–99)
Glucose-Capillary: 89 mg/dL (ref 70–99)

## 2021-06-12 LAB — THYROID PANEL WITH TSH
Free Thyroxine Index: 2.3 (ref 1.2–4.9)
T3 Uptake Ratio: 32 % (ref 24–39)
T4, Total: 7.3 ug/dL (ref 4.5–12.0)
TSH: 1.02 u[IU]/mL (ref 0.450–4.500)

## 2021-06-12 LAB — CBC
HCT: 29.6 % — ABNORMAL LOW (ref 36.0–46.0)
Hemoglobin: 9.2 g/dL — ABNORMAL LOW (ref 12.0–15.0)
MCH: 31.3 pg (ref 26.0–34.0)
MCHC: 31.1 g/dL (ref 30.0–36.0)
MCV: 100.7 fL — ABNORMAL HIGH (ref 80.0–100.0)
Platelets: 224 10*3/uL (ref 150–400)
RBC: 2.94 MIL/uL — ABNORMAL LOW (ref 3.87–5.11)
RDW: 14.1 % (ref 11.5–15.5)
WBC: 13.8 10*3/uL — ABNORMAL HIGH (ref 4.0–10.5)
nRBC: 0 % (ref 0.0–0.2)

## 2021-06-12 LAB — STREP PNEUMONIAE URINARY ANTIGEN: Strep Pneumo Urinary Antigen: NEGATIVE

## 2021-06-12 LAB — URINE CULTURE: Culture: NO GROWTH

## 2021-06-12 LAB — RESPIRATORY PANEL BY PCR

## 2021-06-12 LAB — PROCALCITONIN: Procalcitonin: <0.1 ng/mL

## 2021-06-12 MED ORDER — LIDOCAINE 5 % EX PTCH
1.0000 | MEDICATED_PATCH | CUTANEOUS | Status: DC
  Administered 2021-06-12 – 2021-06-17 (×6): 1 via TRANSDERMAL
  Filled 2021-06-12 (×6): qty 1

## 2021-06-12 MED ORDER — METHYLPREDNISOLONE SODIUM SUCC 40 MG IJ SOLR
40.0000 mg | Freq: Two times a day (BID) | INTRAMUSCULAR | Status: DC
  Administered 2021-06-12 – 2021-06-13 (×2): 40 mg via INTRAVENOUS
  Filled 2021-06-12 (×2): qty 1

## 2021-06-12 MED ORDER — GLUCERNA SHAKE PO LIQD
237.0000 mL | Freq: Three times a day (TID) | ORAL | Status: DC
  Administered 2021-06-12 – 2021-06-14 (×5): 237 mL via ORAL

## 2021-06-12 MED ORDER — TORSEMIDE 20 MG PO TABS
20.0000 mg | ORAL_TABLET | Freq: Every day | ORAL | Status: DC
  Administered 2021-06-12: 20 mg via ORAL
  Filled 2021-06-12: qty 1

## 2021-06-12 MED ORDER — TORSEMIDE 20 MG PO TABS
20.0000 mg | ORAL_TABLET | Freq: Two times a day (BID) | ORAL | Status: DC
  Administered 2021-06-12 – 2021-06-13 (×2): 20 mg via ORAL
  Filled 2021-06-12 (×2): qty 1

## 2021-06-12 MED ORDER — METOPROLOL SUCCINATE ER 25 MG PO TB24
12.5000 mg | ORAL_TABLET | Freq: Every day | ORAL | Status: DC
  Administered 2021-06-12 – 2021-06-17 (×6): 12.5 mg via ORAL
  Filled 2021-06-12 (×2): qty 1
  Filled 2021-06-12: qty 0.5
  Filled 2021-06-12 (×3): qty 1

## 2021-06-12 MED ORDER — POTASSIUM CHLORIDE CRYS ER 10 MEQ PO TBCR
10.0000 meq | EXTENDED_RELEASE_TABLET | Freq: Two times a day (BID) | ORAL | Status: DC
  Administered 2021-06-12 – 2021-06-14 (×4): 10 meq via ORAL
  Filled 2021-06-12 (×4): qty 1

## 2021-06-12 NOTE — Consult Note (Signed)
PULMONOLOGY         Date: 06/12/2021,   MRN# [id number] Sierra Guzman Nov 26, 1940     AdmissionWeight: 63.9 kg                 CurrentWeight: 63.2 kg   Referring physician: Dr Renae Gloss   CHIEF COMPLAINT:   Acute on chronic hypoxemic respiratory failure   HISTORY OF PRESENT ILLNESS   This 81 year old female with a history of interstitial lung disease and chronic hypoxemia on supplemental O2 at with recurrent exacerbations, CHF with reduced LVEF at 45%, chronic diabetes mellitus.  She was evaluated by me in the office and at that time was in mild to moderate respiratory distress and acute on chronic hypoxemia but adamantly declined being hospitalized at that time.  During outpatient visit she did receive intramuscular 20 mg Solu-Medrol as well as increased supplemental oxygen and half liter of resuscitative fluids due to clinical findings of dehydration including skin tenting on all 4 extremities with poor urine output per family.  After this outpatient treatment she improved substantially and actually was able to take a nap in our clinic for over 2 hours while the nurse watched over her.  This improvement however was very transient and short-lived and patient again became worse and finally did come to the ER.  She received nonrebreather in the emergency room as well as resuscitative fluids and stated she improved.  Family reports severe anxiety disorder with recurrent panic attacks.  Patient also received 2 L of resuscitative fluids on admission as well as empiric antibiotics with vancomycin, cefepime, azithromycin.  She was admitted to the medical floor and pulmonary consultation was placed due to complex pulmonary history with interstitial lung disease, chronic hypoxemia and recurrent exacerbations of underlying ILD.   PAST MEDICAL HISTORY   Past Medical History:  Diagnosis Date   Anemia 10/28/2010   unspecified   Anxiety    Aortic valve sclerosis    Arthritis    Asthma     Chronic combined systolic and diastolic CHF (congestive heart failure) (HCC)    echo- 10/22 LVEF 45-55%, G1DD, moderately reduced RV fxn, mild aortic regurg/stenosis   Chronic kidney disease    Chronic Kidney Disease---Stage III   Coronary artery disease 10/28/2010   DDD (degenerative disc disease), lumbar 2006   Diabetes type 2, controlled (HCC) 10/28/2010   Dyspnea    GERD (gastroesophageal reflux disease)    Gout 10/28/2010   Heart murmur    History of cardiac catheterization 2006   LAD 50%, D1 50% rest normal.    History of cardiovascular stress test 09/23/2009    lexiscan Normal Cardiolite test, EF 61%. LV size and function - normal   History of echocardiogram 05/27/2008   EF 55%; Mild aortic insufficiency with minimal aortic sclerosis. L vent normal.    Hyperlipidemia    Hyperlipidemia LDL goal <100 10/28/2010   managed by Dr. Burr Medico   Hypertension 10/28/2010   IPF (idiopathic pulmonary fibrosis) (HCC)    Progressive, with UIP   Mild vitamin D deficiency 02/22/2011   Obesity (BMI 30-39.9) 10/28/2010   unspecified   Osteoarthritis 10/28/2010   bilateral knees, left hip   Personal history of colonic polyps 10/28/2010   Shortness of breath    Syncope 2013   Vitamin B 12 deficiency 10/28/2010     SURGICAL HISTORY   Past Surgical History:  Procedure Laterality Date   BACK SURGERY  2006   DDD   BREAST BIOPSY Right  neg   CARDIAC CATHETERIZATION  2006   EYE SURGERY Bilateral    Cataract Extraction with IOL   JOINT REPLACEMENT Left    Left Total Hip Replacement   JOINT REPLACEMENT     RIGHT 2005; LEFT 2 X 2004, 2011   REPLACEMENT TOTAL KNEE Left    REVISION TOTAL KNEE ARTHROPLASTY Left    Dr. Desha Right 12/12/2016   Procedure: TOTAL HIP ARTHROPLASTY ANTERIOR APPROACH;  Surgeon: Hessie Knows, MD;  Location: [facility];  Service: Orthopedics;  Laterality: Right;   TOTAL HIP ARTHROPLASTY Left 12/26/2013   Procedure:  ARTHROPLASTY HIP TOTAL; Surgeon: Zorita Pang, MD; Location: [facility]; Service: Orthopedics; Laterality: left,    TOTAL KNEE ARTHROPLASTY     both knees     FAMILY HISTORY   Family History  Problem Relation Age of Onset   Breast cancer Mother 26   Diabetes Mother    Stroke Mother    Stroke Brother    Breast cancer Cousin        pat cousin     SOCIAL HISTORY   Social History   Tobacco Use   Smoking status: Former    Packs/day: 0.25    Years: 10.00    Pack years: 2.50    Types: Cigarettes    Quit date: 08/18/1990    Years since quitting: 30.8   Smokeless tobacco: Never  Vaping Use   Vaping Use: Never used  Substance Use Topics   Alcohol use: No   Drug use: No     MEDICATIONS    Home Medication:    Current Medication:  Current Facility-Administered Medications:    0.9 %  sodium chloride infusion, 250 mL, Intravenous, PRN, Posey Pronto, Ekta V, MD   0.9 %  sodium chloride infusion, 250 mL, Intravenous, Continuous, Rust-Chester, Toribio Harbour L, NP   acetaminophen (TYLENOL) tablet 650 mg, 650 mg, Oral, Q6H PRN, 650 mg at 06/11/21 2206 **OR** acetaminophen (TYLENOL) suppository 650 mg, 650 mg, Rectal, Q6H PRN, Para Skeans, MD   azithromycin (ZITHROMAX) tablet 250 mg, 250 mg, Oral, Daily, Wieting, Richard, MD, 250 mg at 06/11/21 1726   budesonide (PULMICORT) nebulizer solution 0.25 mg, 0.25 mg, Nebulization, BID, Rust-Chester, Britton L, NP, 0.25 mg at 06/12/21 [id number]   Chlorhexidine Gluconate Cloth 2 % PADS 6 each, 6 each, Topical, Daily, Rust-Chester, Britton L, NP, 6 each at 06/12/21 0929   dextromethorphan-guaiFENesin ([place] DM) 30-600 MG per 12 hr tablet 1 tablet, 1 tablet, Oral, BID, Rust-Chester, Britton L, NP, 1 tablet at 06/12/21 0929   heparin injection 5,000 Units, 5,000 Units, Subcutaneous, Q8H, Florina Ou V, MD, 5,000 Units at 06/12/21 1353   hydrALAZINE (APRESOLINE) injection 5 mg, 5 mg, Intravenous, Q4H PRN, Para Skeans, MD   hydrOXYzine (ATARAX)  tablet 25 mg, 25 mg, Oral, TID PRN, Rust-Chester, Britton L, NP, 25 mg at 06/12/21 1035   insulin aspart (novoLOG) injection 0-20 Units, 0-20 Units, Subcutaneous, Q4H, Rust-Chester, Britton L, NP, 15 Units at 06/12/21 1543   insulin glargine-yfgn (SEMGLEE) injection 10 Units, 10 Units, Subcutaneous, Daily, Wieting, Richard, MD, 10 Units at 06/12/21 0929   ipratropium-albuterol (DUONEB) 0.5-2.5 (3) MG/3ML nebulizer solution 3 mL, 3 mL, Nebulization, TID, Jonnie Finner, Michele Mcalpine, MD, 3 mL at 06/12/21 0727   ipratropium-albuterol (DUONEB) 0.5-2.5 (3) MG/3ML nebulizer solution 3 mL, 3 mL, Nebulization, Q4H PRN, Schertz, Michele Mcalpine, MD   lidocaine (LIDODERM) 5 % 1 patch, 1 patch, Transdermal, Q24H, Loletha Grayer, MD,  1 patch at 06/12/21 1113   MEDLINE mouth rinse, 15 mL, Mouth Rinse, BID, Rust-Chester, Britton L, NP, 15 mL at 06/12/21 [id number]   metoprolol succinate (TOPROL-XL) 24 hr tablet 12.5 mg, 12.5 mg, Oral, Daily, Leslye Peer, Richard, MD, 12.5 mg at 06/12/21 1113   pantoprazole (PROTONIX) injection 40 mg, 40 mg, Intravenous, Q24H, Rust-Chester, Britton L, NP, 40 mg at 06/12/21 0013   potassium chloride (KLOR-CON M) CR tablet 10 mEq, 10 mEq, Oral, BID, Wieting, Richard, MD   predniSONE (DELTASONE) tablet 30 mg, 30 mg, Oral, Q breakfast, 30 mg at 06/12/21 0759 **FOLLOWED BY** [START ON 06/14/2021] predniSONE (DELTASONE) tablet 20 mg, 20 mg, Oral, Q breakfast **FOLLOWED BY** [START ON 06/17/2021] predniSONE (DELTASONE) tablet 10 mg, 10 mg, Oral, Q breakfast, Schertz, Michele Mcalpine, MD   sodium chloride flush (NS) 0.9 % injection 3 mL, 3 mL, Intravenous, Q12H, Posey Pronto, Ekta V, MD, 3 mL at 06/12/21 0930   sodium chloride flush (NS) 0.9 % injection 3 mL, 3 mL, Intravenous, PRN, Para Skeans, MD   torsemide (DEMADEX) tablet 20 mg, 20 mg, Oral, BID, Wieting, Richard, MD    ALLERGIES   Morphine and related     REVIEW OF SYSTEMS    Review of Systems:  Gen:  Denies  fever, sweats, chills weigh loss  HEENT: Denies  blurred vision, double vision, ear pain, eye pain, hearing loss, nose bleeds, sore throat Cardiac:  No dizziness, chest pain or heaviness, chest tightness,edema Resp:   Denies cough or sputum porduction, shortness of breath,wheezing, hemoptysis,  Gi: Denies swallowing difficulty, stomach pain, nausea or vomiting, diarrhea, constipation, bowel incontinence Gu:  Denies bladder incontinence, burning urine Ext:   Denies Joint pain, stiffness or swelling Skin: Denies  skin rash, easy bruising or bleeding or hives Endoc:  Denies polyuria, polydipsia , polyphagia or weight change Psych:   Denies depression, insomnia or hallucinations   Other:  All other systems negative   VS: BP 100/72 (BP Location: Left Arm)    Pulse 75    Temp 98.2 F (36.8 C) (Oral)    Resp 20    Ht 5\' 5"  (1.651 m)    Wt 63.2 kg    SpO2 98%    BMI 23.19 kg/m      PHYSICAL EXAM    GENERAL:NAD, no fevers, chills, no weakness no fatigue HEAD: Normocephalic, atraumatic.  EYES: Pupils equal, round, reactive to light. Extraocular muscles intact. No scleral icterus.  MOUTH: Moist mucosal membrane. Dentition intact. No abscess noted.  EAR, NOSE, THROAT: Clear without exudates. No external lesions.  NECK: Supple. No thyromegaly. No nodules. No JVD.  PULMONARY: crackles at bases bilaterally  CARDIOVASCULAR: S1 and S2. Regular rate and rhythm. No murmurs, rubs, or gallops. No edema. Pedal pulses 2+ bilaterally.  GASTROINTESTINAL: Soft, nontender, nondistended. No masses. Positive bowel sounds. No hepatosplenomegaly.  MUSCULOSKELETAL: No swelling, clubbing, or edema. Range of motion full in all extremities.  NEUROLOGIC: Cranial nerves II through XII are intact. No gross focal neurological deficits. Sensation intact. Reflexes intact.  SKIN: No ulceration, lesions, rashes, or cyanosis. Skin warm and dry. Turgor intact.  PSYCHIATRIC: Mood, affect within normal limits. The patient is awake, alert and oriented x 3. Insight, judgment  intact.       IMAGING    CT Angio Chest PE W and/or Wo Contrast  Result Date: 06/10/2021 CLINICAL DATA:  Increasing shortness of breath, increasing oxygen requirement, history of pulmonary fibrosis EXAM: CT ANGIOGRAPHY CHEST WITH CONTRAST TECHNIQUE: Multidetector CT imaging of the chest  was performed using the standard protocol during bolus administration of intravenous contrast. Multiplanar CT image reconstructions and MIPs were obtained to evaluate the vascular anatomy. RADIATION DOSE REDUCTION: This exam was performed according to the departmental dose-optimization program which includes automated exposure control, adjustment of the mA and/or kV according to patient size and/or use of iterative reconstruction technique. CONTRAST:  1mL OMNIPAQUE IOHEXOL 350 MG/ML SOLN COMPARISON:  06/10/2021, 01/12/2021 FINDINGS: Cardiovascular: This is a technically adequate evaluation of the pulmonary vasculature. No filling defects or pulmonary emboli. Stable cardiomegaly without pericardial effusion. Normal caliber of the thoracic aorta. Stable atherosclerosis of the aortic arch and coronary vasculature. Mediastinum/Nodes: Decreased mediastinal adenopathy, measuring up to 1.9 cm in the subcarinal station and 1.4 cm in the precarinal region. Thyroid, trachea, and esophagus are unremarkable. Moderate hiatal hernia. Lungs/Pleura: Basilar predominant scarring and fibrosis again noted, with continued bibasilar bronchiectasis. There are areas of increased interlobular septal thickening and ground-glass airspace disease within the mid and upper lung zones, and superimposed edema cannot be excluded. No effusion or pneumothorax. Central airways are patent. Upper Abdomen: No acute abnormality. Musculoskeletal: No acute or destructive bony lesions. Reconstructed images demonstrate no additional findings. Review of the MIP images confirms the above findings. IMPRESSION: 1. No evidence of pulmonary embolus. 2. Stable basilar  predominant pulmonary fibrosis and bibasilar bronchiectasis. 3. Increased interlobular septal thickening and ground-glass opacities within the mid and upper lung zones, which could reflect superimposed edema. 4.  Aortic Atherosclerosis (ICD10-I70.0). Electronically Signed   By: Randa Ngo M.D.   On: 06/10/2021 19:14   DG Chest Port 1 View  Result Date: 06/10/2021 CLINICAL DATA:  Questionable sepsis, evaluate for abnormality. Increasing shortness of breath and oxygen requirement. History of pulmonary fibrosis. EXAM: PORTABLE CHEST 1 VIEW COMPARISON:  Most recent radiograph 05/09/2021, chest CT 01/12/2021 FINDINGS: Low lung volumes persist. Sequela of chronic interstitial lung disease with reticulation and basilar honeycombing. This is stable in appearance from prior exam. Similar cardiomegaly with unchanged mediastinal contours. No superimposed airspace disease, large pleural effusion or pneumothorax. Grossly stable osseous structures. IMPRESSION: Chronic interstitial lung disease, similar in appearance to prior. No evidence of superimposed acute process. Electronically Signed   By: Keith Rake M.D.   On: 06/10/2021 17:12   ECHOCARDIOGRAM COMPLETE  Result Date: 06/11/2021    ECHOCARDIOGRAM REPORT   Patient Name:   KARMANN BLAUSTEIN Date of Exam: 06/11/2021 Medical Rec #:  [id number]    Height:       65.0 in Accession #:    [id number]   Weight:       139.8 lb Date of Birth:  February 29, 1941    BSA:          1.699 m Patient Age:    3 years     BP:           89/57 mmHg Patient Gender: F            HR:           106 bpm. Exam Location:  [facility] Procedure: 2D Echo, Cardiac Doppler and Color Doppler Indications:     CHF-Acute Systolic [id number] / [id number]  History:         Patient has prior history of Echocardiogram examinations. CAD;                  Signs/Symptoms:Shortness of Breath and Syncope.  Sonographer:     Alyse Low Roar Referring Phys:  [place] Diagnosing Phys: Ida Rogue MD IMPRESSIONS  1. Left  ventricular ejection fraction, by estimation, is 30 to 35%. The left ventricle has moderately decreased function. The left ventricle demonstrates global hypokinesis. The left ventricular internal cavity size was mildly dilated. Left ventricular diastolic parameters are consistent with Grade I diastolic dysfunction (impaired relaxation).  2. Right ventricular systolic function is mildly reduced. The right ventricular size is mildly enlarged. Mildly increased right ventricular wall thickness. There is moderately elevated pulmonary artery systolic pressure. The estimated right ventricular systolic pressure is 123456 mmHg.  3. The mitral valve is normal in structure. Mild mitral valve regurgitation. No evidence of mitral stenosis.  4. Tricuspid valve regurgitation is moderate.  5. The aortic valve is normal in structure. Aortic valve regurgitation is not visualized. Aortic valve sclerosis/calcification is present, without any evidence of aortic stenosis.  6. Compared to prior study dated 12/09/2020, EF has decreased significantly. FINDINGS  Left Ventricle: Left ventricular ejection fraction, by estimation, is 30 to 35%. The left ventricle has moderately decreased function. The left ventricle demonstrates global hypokinesis. The left ventricular internal cavity size was mildly dilated. There is no left ventricular hypertrophy. Left ventricular diastolic parameters are consistent with Grade I diastolic dysfunction (impaired relaxation). Right Ventricle: The right ventricular size is mildly enlarged. Mildly increased right ventricular wall thickness. Right ventricular systolic function is mildly reduced. There is moderately elevated pulmonary artery systolic pressure. The tricuspid regurgitant velocity is 3.18 m/s, and with an assumed right atrial pressure of 5 mmHg, the estimated right ventricular systolic pressure is 123456 mmHg. Left Atrium: Left atrial size was normal in size. Right Atrium: Right atrial size was normal in  size. Pericardium: There is no evidence of pericardial effusion. Mitral Valve: The mitral valve is normal in structure. Mild mitral valve regurgitation. No evidence of mitral valve stenosis. Tricuspid Valve: The tricuspid valve is normal in structure. Tricuspid valve regurgitation is moderate . No evidence of tricuspid stenosis. Aortic Valve: The aortic valve is normal in structure. Aortic valve regurgitation is not visualized. Aortic valve sclerosis/calcification is present, without any evidence of aortic stenosis. Aortic valve peak gradient measures 12.7 mmHg. Pulmonic Valve: The pulmonic valve was normal in structure. Pulmonic valve regurgitation is mild. No evidence of pulmonic stenosis. Aorta: The aortic root is normal in size and structure. IAS/Shunts: No atrial level shunt detected by color flow Doppler.  LEFT VENTRICLE PLAX 2D LVIDd:         4.63 cm      Diastology LVIDs:         3.91 cm      LV e' medial:    4.24 cm/s LV PW:         0.84 cm      LV E/e' medial:  13.6 LV IVS:        0.95 cm      LV e' lateral:   7.29 cm/s LVOT diam:     2.10 cm      LV E/e' lateral: 7.9 LVOT Area:     3.46 cm  LV Volumes (MOD) LV vol d, MOD A2C: 104.0 ml LV vol d, MOD A4C: 104.0 ml LV vol s, MOD A2C: 64.3 ml LV vol s, MOD A4C: 68.3 ml LV SV MOD A2C:     39.7 ml LV SV MOD A4C:     104.0 ml LV SV MOD BP:      37.2 ml RIGHT VENTRICLE RV Basal diam:  3.24 cm RV Mid diam:    2.59 cm RV S prime:     13.90 cm/s TAPSE (  M-mode): 1.9 cm LEFT ATRIUM             Index        RIGHT ATRIUM           Index LA diam:        4.50 cm 2.65 cm/m   RA Area:     14.90 cm LA Vol (A2C):   56.2 ml 33.08 ml/m  RA Volume:   37.60 ml  22.13 ml/m LA Vol (A4C):   51.1 ml 30.08 ml/m LA Biplane Vol: 56.4 ml 33.20 ml/m  AORTIC VALVE                 PULMONIC VALVE AV Area (Vmax): 1.48 cm     PV Vmax:          0.91 m/s AV Vmax:        178.00 cm/s  PV Peak grad:     3.3 mmHg AV Peak Grad:   12.7 mmHg    PR End Diast Vel: 15.84 msec LVOT Vmax:      75.90  cm/s   RVOT Peak grad:   1 mmHg  AORTA Ao Root diam: 2.50 cm Ao Asc diam:  2.60 cm MITRAL VALVE               TRICUSPID VALVE MV Area (PHT): 5.50 cm    TR Peak grad:   40.4 mmHg MV Decel Time: 138 msec    TR Vmax:        318.00 cm/s MV E velocity: 57.50 cm/s MV A velocity: 93.00 cm/s  SHUNTS MV E/A ratio:  0.62        Systemic Diam: 2.10 cm MV A Prime:    11.6 cm/s Julien Nordmannimothy Gollan MD Electronically signed by Julien Nordmannimothy Gollan MD Signature Date/Time: 06/11/2021/4:42:08 PM    Final     Ref Range & Units 1 yr ago  Anti-JO 1 Ab <20 Units <20   Anti-PL-7 Ab Negative Negative   Comment: This test was developed and its performance characteristics  determined by [facility]. It has not been cleared or  approved by the Food and Drug Administration.  Anti-PL-12 Ab Negative Negative   Comment: This test was developed and its performance characteristics  determined by [facility]. It has not been cleared or  approved by the Food and Drug Administration.  Anti-EJ Ab Negative Negative   Comment: This test was developed and its performance characteristics  determined by [facility]. It has not been cleared or  approved by the Food and Drug Administration.  Anti-OJ Ab Negative Negative   Comment: This test was developed and its performance characteristics  determined by [facility]. It has not been cleared or  approved by the Food and Drug Administration.  Anti-SRP Ab Negative Negative   Comment: This test was developed and its performance characteristics  determined by [facility]. It has not been cleared or  approved by the Food and Drug Administration.  Anti-Mi-2 Ab Negative Negative   Comment: This test was developed and its performance characteristics  determined by [facility]. It has not been cleared or  approved by the Food and Drug Administration.  Anti-TIF 1gamma Ab <20 Units <20   Comment: This test was developed and its performance characteristics  determined by [facility]. It has not been cleared or  approved by the Food  and Drug Administration.  Anti-MDA-5 Ab (CADM-140) <20 Units <20   Comment: This test was developed and its performance characteristics  determined by [facility]. It has not been cleared  or  approved by the Food and Drug Administration.  Anti-NXP-2 (P140) Ab <20 Units <20   Comment: This test was developed and its performance characteristics  determined by [facility]. It has not been cleared or  approved by the Food and Drug Administration.  ANTI-PM/SCL-100 AB <20 Units <20   Comment: This test was developed and its performance characteristics  determined by [facility]. It has not been cleared or  approved by the Food and Drug Administration.  Anti-Ku Ab Negative Negative   Comment: This test was developed and its performance characteristics  determined by [facility]. It has not been cleared or  approved by the Food and Drug Administration.  Anti-SS-A 52kD Ab, IgG <20 Units <20   Comment: This test was developed and its performance characteristics  determined by [facility]. It has not been cleared or  approved by the Food and Drug Administration.  Anti-U1 RNP Ab <20 Units <20   Comment: This test was developed and its performance characteristics  determined by [facility]. It has not been cleared or  approved by the Food and Drug Administration.  Anti-U2 RNP Ab Negative Negative   Comment: This test was developed and its performance characteristics  determined by [facility]. It has not been cleared or  approved by the Food and Drug Administration.  Anti-U3 RNP (Fibrillarin) Negative Negative   Comment: This test was developed and its performance characteristics  determined by [facility]. It has not been cleared or  approved by the Food and Drug Administration.      Interpretation for Anti-Jo-1, Anti-TIF-1gamma,      Anti-MDA-5, Anti-NXP-2, Anti-PM/Scl-100,      Anti-SS-A 52 kD, Anti-U1 RNP:          Negative:                                <20          Weak Positive:                       20 - 39           Moderate Positive:                   40 - 80          Strong Positive:                         >80                                                        .        ASSESSMENT/PLAN   Acute on chronic hypoxemic respiratory failure     - due to exacerbation of ILD - present on admission      - patient is improved s/p steroids and IVF      - patient reporting severe anxiety disorder   -autoimmune workup is negative from previous lab review     -IV solumedrol 40 bid      - Due to DM with hyperglycemia have placed Diabetic coordinator consultation   Acute on chronic systolic CHF   - patient is currently without chest pain and is able to speak in full sentences    -  caution with fluid balance   Acute on chronic renal failure     - s/p diuresis      - dc non essential nephrotoxins    Moderate protein calorie malnutrition     - bitermporal wasting with hypolabuminemia      Thank you for allowing me to participate in the care of this patient.    Patient/Family are satisfied with care plan and all questions have been answered.  This document was prepared using Dragon voice recognition software and may include unintentional dictation errors.     Ottie Glazier, M.D.  Division of [place]

## 2021-06-12 NOTE — Progress Notes (Signed)
?Progress Note ? ? ?Patient: Sierra Guzman [id number] DOB: [birth year] DOA: 06/10/2021     2 ?DOS: the patient was seen and examined on 06/12/2021 ? ? ?Assessment and Plan: ?* Acute on chronic respiratory failure with hypoxemia (HCC) ?Acute on chronic respiratory failure with hypoxemia.  The patient had respiratory distress on presentation requiring BiPAP.  Initial pulse ox 85%.  The patient states that she wears 2 L oxygen at rest and 4 L with ambulation.  Was on 4 L of oxygen this morning ? ? ?Acute on chronic combined systolic and diastolic CHF (congestive heart failure) ([place]) ?This echo showing EF of 30 to 35%.  Respiratory status improved.  Switch over to oral torsemide.  Low-dose Toprol-XL restarted.  Limited with any other medications at this time secondary to hypotension. ? ?Interstitial lung disease ([place]) ?Sepsis ruled out.  I think this is interstitial lung disease exacerbation also.  Initially IV steroids now on oral prednisone.  Continue nebulizer treatments. ? ?CKD (chronic kidney disease) stage 3, GFR 30-59 ml/min (HCC) ?Patient's creatinine up a little bit today at 1.03 with a GFR 55.  Continue to watch with diuresis ? ?Uncontrolled type 2 diabetes mellitus with hyperglycemia, without long-term current use of insulin ([place]) ?Continue Semglee insulin while on steroids.  Does take Glucophage at home. ? ?Hypotension ?Low-dose metoprolol started today ? ?Anemia ?Anemia of chronic disease with last ferritin being elevated.  Last hemoglobin 9.2 ? ? ?Hypomagnesemia ?Replaced yesterday. ? ?Pressure injury of skin ?Stage II on sacrum and coccyx.  See full description below, present on admission. ? ?Coronary artery disease ?Borderline troponins likely demand ischemia with acute hypoxic respiratory failure ? ?Elevated liver function tests ?Likely secondary to CHF. ? ? ? ? ? ? ? ?Subjective: Patient asked to go home as soon as possible.  She is feeling better.  She is breathing better she is back to her baseline  oxygen.  Heart rate this morning up to 150 with ambulation.  Restarted Toprol XL ? ?Physical Exam: ?Vitals:  ? 06/12/21 1300 06/12/21 1400 06/12/21 1500 06/12/21 1615  ?BP: 138/85 95/62 116/69 100/72  ?Pulse: (!) 126 (!) 130 (!) 109 75  ?Resp: (!) 29 (!) 27 (!) 22 20  ?Temp:      ?TempSrc:    Oral  ?SpO2: 100% 99% 100% 98%  ?Weight:      ?Height:      ? ?Physical Exam ?HENT:  ?   Head: Normocephalic.  ?   Mouth/Throat:  ?   Pharynx: No oropharyngeal exudate.  ?Eyes:  ?   General: Lids are normal.  ?   Conjunctiva/sclera: Conjunctivae normal.  ?Cardiovascular:  ?   Rate and Rhythm: Regular rhythm. Tachycardia present.  ?   Heart sounds: Normal heart sounds, S1 normal and S2 normal.  ?Pulmonary:  ?   Breath sounds: Examination of the right-lower field reveals decreased breath sounds and rhonchi. Examination of the left-lower field reveals decreased breath sounds and rhonchi. Decreased breath sounds and rhonchi present. No wheezing or rales.  ?Abdominal:  ?   Palpations: Abdomen is soft.  ?   Tenderness: There is no abdominal tenderness.  ?Musculoskeletal:  ?   Right lower leg: No swelling.  ?   Left lower leg: No swelling.  ?Skin: ?   General: Skin is warm.  ?   Findings: No rash.  ?Neurological:  ?   Mental Status: She is alert and oriented to person, place, and time.  ?  ? ?Data Reviewed: ?Laboratory and radiological  data reviewed.  Creatinine 1.03 today.  AST up at 155 ALT 91.  Hemoglobin 9.2 ? ?Family Communication: Spoke with daughter on the phone ? ?Disposition: ?Status is: Inpatient ?Remains inpatient appropriate because: Heart rate going up to 150 with ambulation. ? ?Planned Discharge Destination: Rehab physical therapy recommends rehab but patient interested in going home. ? ?Author: ?Loletha Grayer, MD ?06/12/2021 4:32 PM ? ?For on call review [email or web address].  ? ?

## 2021-06-12 NOTE — Progress Notes (Signed)
Inpatient Rehab Admissions Coordinator:  ? ?Per PT recommendation, patient was screened for CIR candidacy by Megan Salon, MS, CCC-SLP. At this time, Pt. does not appear to demonstrate medical necessity to justify in hospital rehabilitation/CIR. Additionally, Pt.'s insurance is very unlikely to approve admission for her diagnoses. I will not pursue a rehab consult for this Pt.   Recommend other rehab venues to be pursued.  Please contact me with any questions. ? ?Megan Salon, MS, CCC-SLP ?Rehab Admissions Coordinator  ?[phone] (celll) ?[phone] (office) ? ? ?

## 2021-06-12 NOTE — Assessment & Plan Note (Addendum)
Likely secondary to CHF.  AST up to 219 and ALT up to 181.  Total bilirubin 0.6. ?

## 2021-06-12 NOTE — Progress Notes (Signed)
Initial Nutrition Assessment ? ?DOCUMENTATION CODES:  ? ?Not applicable ? ?INTERVENTION:  ?- Recommend diet liberalization from heart healthy/carb modified diet to a carb modified diet to provide more menu options to enhance nutritional adequacy ? ?- Glucerna Shake po TID, each supplement provides 220 kcal and 10 grams of protein ? ?NUTRITION DIAGNOSIS:  ? ?Increased nutrient needs related to acute illness as evidenced by estimated needs. ? ?GOAL:  ? ?Patient will meet greater than or equal to 90% of their needs ? ?MONITOR:  ? ?PO intake, Supplement acceptance, Diet advancement, Labs, Weight trends, Skin ? ?REASON FOR ASSESSMENT:  ? ?Malnutrition Screening Tool ?  ? ?ASSESSMENT:  ? ?Pt admitted from home with SOB secondary to acute on chronic respiratory failure with hypoxemia. PMH includes anemia, chronic diastolic CHF with EF of [id number], and T2DM. ? ?Unsuccessful attempt to reach pt by phone. Information obtained by chart review. ? ?Meal completions: 60-80% x2 recorded meals ? ?Per review of chart, weight on 1/30 documented at 68.3 kg. Weight today noted to be 63.2 kg. This is a 7% weight loss within 2 month which is significant for time frame.  ? ?Moderate pitting BLE edema present. ? ?Pt is at risk for malnutrition and suspect a degree of malnutrition is present, however unable to confirm at this time given limited nutrition/physical exam history. Will continue to reassess as able. ? ?Given pt's weight loss and risk for malnutrition, pt would likely benefit from addition of nutrition supplements and diet liberalization to provide more menu options to optimize nutritional intake.  ? ?Medications: azithromycin, SSI, semglee 10 units daily, protonix, KLOR-CON, prednisone, torsemide ? ?Labs: BUN 29, Cr 1.03, Mg 3.7, AST 155, ALT 91,  ? CBG's 102-336 x 24 hours ? ?UOP: 1.8L x 24 hours ?I/O's: -5.3L since admission ? ?NUTRITION - FOCUSED PHYSICAL EXAM: ?RD working remotely. Deferred to follow up. ? ?Diet Order:   ?Diet  Order   ? ?       ?  Diet Carb Modified Fluid consistency: Thin; Room service appropriate? Yes  Diet effective now       ?  ? ?  ?  ? ?  ? ? ?EDUCATION NEEDS:  ? ?No education needs have been identified at this time ? ?Skin:  Skin Assessment: Skin Integrity Issues: ?Skin Integrity Issues:: Stage II ?Stage II: coccyx ? ?Last BM:  PTA ? ?Height:  ? ?Ht Readings from Last 1 Encounters:  ?06/10/21 5\' 5"  (1.651 m)  ? ? ?Weight:  ? ?Wt Readings from Last 1 Encounters:  ?06/12/21 63.2 kg  ? ? ?BMI:  Body mass index is 23.19 kg/m?. ? ?Estimated Nutritional Needs:  ? ?Kcal:  1550-1750 ? ?Protein:  80-95g ? ?Fluid:  >/=1.6L ? ?Clayborne Dana, RDN, LDN ?Clinical Nutrition ?

## 2021-06-12 NOTE — TOC Progression Note (Signed)
Transition of Care (TOC) - Progression Note  ? ? ?Patient Details  ?Name: Sierra Guzman ?MRN: [id number] ?Date of Birth: 08-Sep-1940 ? ?Transition of Care (TOC) CM/SW Contact  ?Elfa Wooton L Dock Baccam, LCSWA ?Phone Number: ?06/12/2021, 12:04 PM ? ?Clinical Narrative:    ? ?TOC consult acknowledged. Awaiting PT evaluation. TOC will continue to follow. ? ?  ?  ? ?Expected Discharge Plan and Services ?  ?  ?  ?  ?  ?                ?  ?  ?  ?  ?  ?  ?  ?  ?  ?  ? ? ?Social Determinants of Health (SDOH) Interventions ?  ? ?Readmission Risk Interventions ?Readmission Risk Prevention Plan 03/09/2021  ?Transportation Screening Complete  ?PCP or Specialist Appt within 3-5 Days Complete  ?HRI or Home Care Consult Complete  ?Social Work Consult for Recovery Care Planning/Counseling Complete  ?Palliative Care Screening Not Applicable  ?Medication Review Oceanographer) Complete  ?Some recent data might be hidden  ? ? ?

## 2021-06-12 NOTE — Evaluation (Signed)
Physical Therapy Evaluation ?Patient Details ?Name: Sierra Guzman ?MRN: [id number] ?DOB: 10/23/1940 ?Today's Date: 06/12/2021 ? ?History of Present Illness ? Pt is an 81 y/o F admitted on 06/10/21 after presenting with c/c of SOB & noted to have low BP & tachycardia. Pt met sepsis criteria but this was later ruled out. Chest x-ray does show PNA. 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 PMH: anemia, chronic diastolic CHF, DM2, anxiety, asthma, CKD stage 3, lumbar DDD, CAD, gout, HLD, idiopathic pulmonary fibrosis, obesity, syncope  ?Clinical Impression ? Pt seen for PT evaluation with pt agreeable to tx. Pt reports she lives in a 1 level home with 4 steps with wide B rails to enter; pt reports she lives with her daughter who works 12 hr shifts & pt is home alone with no one else to assist. On this date, pt is able to complete supine>sit with supervision with bed rails &  HOB elevated & stand pivot bed>chair with CGA without AD & pt eager to attempt task on her own. Further gait deferred 2/2 elevated HR. Pt does endorse some SOB during session but SPO2 >90% on 4L/min. At this time, pt is unsafe to d/c home alone for 12 hrs at a time & would benefit from ongoing therapy services.  ? ?BP sitting EOB 107/75 mmHg MAP 86 -- pt does endorse some "lightheadedness" while sitting EOB that very slowly improved ?BP sitting in recliner 119/64 mmHg MAP 81 ?HR 108-171 bpm during session   ?   ? ?Recommendations for follow up therapy are one component of a multi-disciplinary discharge planning process, led by the attending physician.  Recommendations may be updated based on patient status, additional functional criteria and insurance authorization. ? ?Follow Up Recommendations Acute inpatient rehab (3hours/day) ? ?  ?Assistance Recommended at Discharge Intermittent Supervision/Assistance  ?Patient can return home with the following ? A little help with  walking and/or transfers;A little help with bathing/dressing/bathroom;Assistance with cooking/housework ? ?  ?Equipment Recommendations None recommended by PT  ?Recommendations for Other Services ?    ?  ?Functional Status Assessment Patient has had a recent decline in their functional status and demonstrates the ability to make significant improvements in function in a reasonable and predictable amount of time.  ? ?  ?Precautions / Restrictions Precautions ?Precautions: Fall ?Restrictions ?Weight Bearing Restrictions: No  ? ?  ? ?Mobility ? Bed Mobility ?Overal bed mobility: Needs Assistance ?Bed Mobility: Supine to Sit ?  ?  ?Supine to sit: Supervision, HOB elevated ?  ?  ?General bed mobility comments: use of bed rails ?  ? ?Transfers ?Overall transfer level: Needs assistance ?Equipment used: None ?Transfers: Sit to/from Stand, Bed to chair/wheelchair/BSC ?Sit to Stand: Min guard ?  ?Step pivot transfers: Min guard ?  ?  ?  ?  ?  ? ?Ambulation/Gait ?  ?  ?  ?  ?  ?  ?  ?  ? ?Stairs ?  ?  ?  ?  ?  ? ?Wheelchair Mobility ?  ? ?Modified Rankin (Stroke Patients Only) ?  ? ?  ? ?Balance Overall balance assessment: Needs assistance ?Sitting-balance support: Feet supported, Bilateral upper extremity supported, No upper extremity supported ?Sitting balance-Leahy Scale: Good ?Sitting balance - Comments: supervision static sitting ?  ?Standing balance support: During functional activity, Single extremity supported ?Standing balance-Leahy Scale: Fair ?  ?  ?  ?  ?  ?  ?  ?  ?  ?  ?  ?  ?   ? ? ? ?  Pertinent Vitals/Pain Pain Assessment ?Pain Assessment: Faces ?Faces Pain Scale: Hurts a little bit ?Pain Location: pt endorses a "weird" feeling across upper abdomen/stomach ?Pain Descriptors / Indicators: Discomfort ?Pain Intervention(s): Monitored during session (nurse notified)  ? ? ?Home Living Family/patient expects to be discharged to:: Private residence ?Living Arrangements: Children ?Available Help at Discharge:  Family;Available PRN/intermittently (daughter works 12 hr shifts & no one else can be home with her) ?Type of Home: House ?Home Access: Stairs to enter ?Entrance Stairs-Rails: Right;Left (wideset) ?Entrance Stairs-Number of Steps: 4 ?  ?Home Layout: One level ?Home Equipment: Rollator (4 wheels);Wheelchair - manual ?   ?  ?Prior Function Prior Level of Function : Independent/Modified Independent ?  ?  ?  ?  ?  ?  ?Mobility Comments: Pt reports she's independent in the home without AD, uses w/c for community mobility 2/2 "my oxygen drops", on 2-3L/min at rest at home but 4L/min with mobility ?  ?  ? ? ?Hand Dominance  ?   ? ?  ?Extremity/Trunk Assessment  ? Upper Extremity Assessment ?Upper Extremity Assessment: Overall WFL for tasks assessed ?  ? ?Lower Extremity Assessment ?Lower Extremity Assessment: Overall WFL for tasks assessed;Generalized weakness ?  ? ?   ?Communication  ? Communication: No difficulties  ?Cognition Arousal/Alertness: Awake/alert ?Behavior During Therapy: [facility] for tasks assessed/performed ?Overall Cognitive Status: Within Functional Limits for tasks assessed ?  ?  ?  ?  ?  ?  ?  ?  ?  ?  ?  ?  ?  ?  ?  ?  ?General Comments: Pleasant ?  ?  ? ?  ?General Comments   ? ?  ?Exercises General Exercises - Lower Extremity ?Long Arc Quad: AROM, Strengthening, Both, 10 reps, Seated ?Hip Flexion/Marching: AROM, Seated, Strengthening, Both, 10 reps  ? ?Assessment/Plan  ?  ?PT Assessment Patient needs continued PT services  ?PT Problem List Decreased strength;Decreased mobility;Decreased activity tolerance;Decreased balance;Cardiopulmonary status limiting activity ? ?   ?  ?PT Treatment Interventions DME instruction;Therapeutic exercise;Gait training;Balance training;Stair training;Neuromuscular re-education;Functional mobility training;Therapeutic activities;Patient/family education   ? ?PT Goals (Current goals can be found in the Care Plan section)  ?Acute Rehab PT Goals ?Patient Stated Goal: get better,  go home ?PT Goal Formulation: With patient ?Time For Goal Achievement: 06/26/21 ?Potential to Achieve Goals: Good ? ?  ?Frequency Min 2X/week ?  ? ? ?Co-evaluation   ?  ?  ?  ?  ? ? ?  ?AM-PAC PT "6 Clicks" Mobility  ?Outcome Measure Help needed turning from your back to your side while in a flat bed without using bedrails?: None ?Help needed moving from lying on your back to sitting on the side of a flat bed without using bedrails?: A Little ?Help needed moving to and from a bed to a chair (including a wheelchair)?: A Little ?Help needed standing up from a chair using your arms (e.g., wheelchair or bedside chair)?: A Little ?Help needed to walk in hospital room?: A Little ?Help needed climbing 3-5 steps with a railing? : A Lot ?6 Click Score: 18 ? ?  ?End of Session   ?Activity Tolerance: Treatment limited secondary to medical complications (Comment) ?Patient left: in chair;with call bell/phone within reach ?Nurse Communication: Mobility status ?PT Visit Diagnosis: Difficulty in walking, not elsewhere classified (R26.2);Muscle weakness (generalized) (M62.81) ?  ? ?Time: 7867-6720 ?PT Time Calculation (min) (ACUTE ONLY): 20 min ? ? ?Charges:   PT Evaluation ?$PT Eval Moderate Complexity: 1 Mod ?PT Treatments ?$Therapeutic Activity:  8-22 mins ?  ?   ? ? ?Lavone Nian, PT, DPT ?06/12/21, 1:39 PM ? ? ?Waunita Schooner ?06/12/2021, 1:36 PM ? ?

## 2021-06-13 ENCOUNTER — Other Ambulatory Visit (HOSPITAL_COMMUNITY): Payer: Self-pay

## 2021-06-13 ENCOUNTER — Encounter: Payer: Self-pay | Admitting: Internal Medicine

## 2021-06-13 DIAGNOSIS — J9600 Acute respiratory failure, unspecified whether with hypoxia or hypercapnia: Secondary | ICD-10-CM

## 2021-06-13 DIAGNOSIS — R008 Other abnormalities of heart beat: Secondary | ICD-10-CM

## 2021-06-13 DIAGNOSIS — J849 Interstitial pulmonary disease, unspecified: Secondary | ICD-10-CM | POA: Diagnosis not present

## 2021-06-13 DIAGNOSIS — J9621 Acute and chronic respiratory failure with hypoxia: Secondary | ICD-10-CM | POA: Diagnosis not present

## 2021-06-13 DIAGNOSIS — N1831 Chronic kidney disease, stage 3a: Secondary | ICD-10-CM | POA: Diagnosis not present

## 2021-06-13 DIAGNOSIS — I5043 Acute on chronic combined systolic (congestive) and diastolic (congestive) heart failure: Secondary | ICD-10-CM | POA: Diagnosis not present

## 2021-06-13 LAB — GLUCOSE, CAPILLARY
Glucose-Capillary: 138 mg/dL — ABNORMAL HIGH (ref 70–99)
Glucose-Capillary: 76 mg/dL (ref 70–99)
Glucose-Capillary: 124 mg/dL — ABNORMAL HIGH (ref 70–99)
Glucose-Capillary: 228 mg/dL — ABNORMAL HIGH (ref 70–99)
Glucose-Capillary: 249 mg/dL — ABNORMAL HIGH (ref 70–99)
Glucose-Capillary: 138 mg/dL — ABNORMAL HIGH (ref 70–99)
Glucose-Capillary: 59 mg/dL — ABNORMAL LOW (ref 70–99)

## 2021-06-13 LAB — BASIC METABOLIC PANEL
Anion gap: 11 (ref 5–15)
BUN: 33 mg/dL — ABNORMAL HIGH (ref 8–23)
CO2: 36 mmol/L — ABNORMAL HIGH (ref 22–32)
Calcium: 9.5 mg/dL (ref 8.9–10.3)
Chloride: 92 mmol/L — ABNORMAL LOW (ref 98–111)
Creatinine, Ser: 1 mg/dL (ref 0.44–1.00)
GFR, Estimated: 57 mL/min — ABNORMAL LOW (ref >60)
Glucose, Bld: 264 mg/dL — ABNORMAL HIGH (ref 70–99)
Potassium: 4.1 mmol/L (ref 3.5–5.1)
Sodium: 139 mmol/L (ref 135–145)

## 2021-06-13 LAB — C-REACTIVE PROTEIN: CRP: 0.8 mg/dL (ref <1.0)

## 2021-06-13 LAB — CULTURE, BLOOD (ROUTINE X 2): Special Requests: ADEQUATE

## 2021-06-13 LAB — HEMOGLOBIN A1C
Hgb A1c MFr Bld: 5.9 % — ABNORMAL HIGH (ref 4.8–5.6)
Mean Plasma Glucose: 123 mg/dL

## 2021-06-13 LAB — HEMOGLOBIN: Hemoglobin: 10.4 g/dL — ABNORMAL LOW (ref 12.0–15.0)

## 2021-06-13 MED ORDER — ALPRAZOLAM 0.25 MG PO TABS
0.2500 mg | ORAL_TABLET | Freq: Three times a day (TID) | ORAL | Status: DC | PRN
  Administered 2021-06-13 – 2021-06-17 (×8): 0.25 mg via ORAL
  Filled 2021-06-13 (×8): qty 1

## 2021-06-13 MED ORDER — INSULIN ASPART 100 UNIT/ML IJ SOLN
0.0000 [IU] | Freq: Three times a day (TID) | INTRAMUSCULAR | Status: DC
  Administered 2021-06-14: 5 [IU] via SUBCUTANEOUS
  Administered 2021-06-14: 2 [IU] via SUBCUTANEOUS
  Administered 2021-06-14: 5 [IU] via SUBCUTANEOUS
  Administered 2021-06-15: 1 [IU] via SUBCUTANEOUS
  Administered 2021-06-15: 5 [IU] via SUBCUTANEOUS
  Administered 2021-06-15: 2 [IU] via SUBCUTANEOUS
  Administered 2021-06-16: 08:00:00 1 [IU] via SUBCUTANEOUS
  Administered 2021-06-16: 12:00:00 3 [IU] via SUBCUTANEOUS
  Administered 2021-06-16: 18:00:00 5 [IU] via SUBCUTANEOUS
  Administered 2021-06-17: 2 [IU] via SUBCUTANEOUS
  Administered 2021-06-17: 3 [IU] via SUBCUTANEOUS
  Filled 2021-06-13 (×9): qty 1

## 2021-06-13 MED ORDER — METHYLPREDNISOLONE SODIUM SUCC 40 MG IJ SOLR
40.0000 mg | INTRAMUSCULAR | Status: DC
  Administered 2021-06-14: 40 mg via INTRAVENOUS
  Filled 2021-06-13: qty 1

## 2021-06-13 MED ORDER — DIGOXIN 125 MCG PO TABS
0.1250 mg | ORAL_TABLET | Freq: Every day | ORAL | Status: DC
  Administered 2021-06-13 – 2021-06-17 (×5): 0.125 mg via ORAL
  Filled 2021-06-13 (×5): qty 1

## 2021-06-13 MED ORDER — EMPAGLIFLOZIN 10 MG PO TABS
10.0000 mg | ORAL_TABLET | Freq: Every day | ORAL | Status: DC
  Administered 2021-06-13 – 2021-06-17 (×5): 10 mg via ORAL
  Filled 2021-06-13 (×5): qty 1

## 2021-06-13 MED ORDER — METOPROLOL TARTRATE 5 MG/5ML IV SOLN
5.0000 mg | INTRAVENOUS | Status: DC | PRN
  Filled 2021-06-13: qty 5

## 2021-06-13 MED ORDER — DEXTROSE 50 % IV SOLN: 1.0000 | Freq: Once | INTRAVENOUS | Status: DC

## 2021-06-13 MED ORDER — ENOXAPARIN SODIUM 40 MG/0.4ML IJ SOSY
40.0000 mg | PREFILLED_SYRINGE | INTRAMUSCULAR | Status: DC
Start: 2021-06-13 — End: 2021-06-17
  Administered 2021-06-13 – 2021-06-16 (×4): 40 mg via SUBCUTANEOUS
  Filled 2021-06-13 (×4): qty 0.4

## 2021-06-13 MED ORDER — DEXTROSE 50 % IV SOLN: 1.0000 | INTRAVENOUS | Status: DC | PRN

## 2021-06-13 NOTE — Progress Notes (Signed)
Dr. Kirke Corin paged and notified that patient HR sustaining 120-130s. Blood pressures on low end 90s-100 systolic; unable to give Metoprolol with low BP. First dose digoxin given.  ?No new orders given at this time. Will report off to [facility] RN. ?

## 2021-06-13 NOTE — TOC Initial Note (Signed)
Transition of Care (TOC) - Initial/Assessment Note  ? ? ?Patient Details  ?Name: Sierra Guzman ?MRN: [id number] ?Date of Birth: 1941/02/13 ? ?Transition of Care (TOC) CM/SW Contact:    ?Candie Chroman, LCSW ?Phone Number: ?06/13/2021, 1:08 PM ? ?Clinical Narrative:   Readmission prevention screen complete. CSW met with patient. No supports at bedside although sister-in-law came in towards end of conversation. PCP is Reginia Forts, MD at [facility]. Family drives her to appointments. [facility] [place]. No issues obtaining medications. Patient was active with [facility] prior to admission for PT, OT, RN, aide. They are requesting order for social worker at discharge. Patient is on chronic oxygen through [place]. States she also has wheelchair, rollator, and bedside commode. Patient is not interested in SNF placement at this time. She did ask about getting more assistance at home and said her daughter is looking into this for her. Offered referral to [facility] but patient said to ask again tomorrow. No further concerns. CSW encouraged patient to contact CSW as needed. CSW will continue to follow patient for support and facilitate return home when stable.              ? ?Expected Discharge Plan: [place] ?Barriers to Discharge: Continued Medical Work up ? ? ?Patient Goals and CMS Choice ?  ?  ?Choice offered to / list presented to : NA ? ?Expected Discharge Plan and Services ?Expected Discharge Plan: [place] ?  ?  ?Post Acute Care Choice: Resumption of Svcs/PTA Provider ?Living arrangements for the past 2 months: [place] ?                ?  ?  ?  ?  ?  ?HH Arranged: RN, PT, OT, Nurse's Aide, Social Work ?[place] Agency: [place] ?Date HH Agency Contacted: 06/13/21 ?  ?Representative spoke with at [place]: [place] ? ?Prior Living Arrangements/Services ?Living arrangements for the past 2 months: [place] ?Lives  with:: Adult Children ?Patient language and need for interpreter reviewed:: Yes ?Do you feel safe going back to the place where you live?: Yes      ?Need for Family Participation in Patient Care: Yes (Comment) ?Care giver support system in place?: Yes (comment) ?Current home services: DME, Home OT, Home PT, Home RN, Homehealth aide ?Criminal Activity/Legal Involvement Pertinent to Current Situation/Hospitalization: No - Comment as needed ? ?Activities of Daily Living ?Home Assistive Devices/Equipment: Grab bars around toilet, Grab bars in shower, Raised toilet seat with rails, Wheelchair, Walker (specify type) ?ADL Screening (condition at time of admission) ?Patient's cognitive ability adequate to safely complete daily activities?: Yes ?Is the patient deaf or have difficulty hearing?: No ?Does the patient have difficulty seeing, even when wearing glasses/contacts?: No ?Does the patient have difficulty concentrating, remembering, or making decisions?: No ?Patient able to express need for assistance with ADLs?: Yes ?Does the patient have difficulty dressing or bathing?: No ?Independently performs ADLs?: No ?Communication: Independent ?Dressing (OT): Needs assistance ?Is this a change from baseline?: Pre-admission baseline ?Grooming: Needs assistance ?Is this a change from baseline?: Pre-admission baseline ?Feeding: Independent ?Bathing: Needs assistance ?Is this a change from baseline?: Pre-admission baseline ?Toileting: Independent ?In/Out Bed: Independent ?Walks in Home: Independent with device (comment) ?Does the patient have difficulty walking or climbing stairs?: Yes ?Weakness of Legs: Both ?Weakness of Arms/Hands: None ? ?Permission Sought/Granted ?Permission sought to share information with : Customer service manager ?Permission  granted to share information with : Yes, Verbal Permission Granted ?   ? Permission granted to share info w AGENCY: [place] ?   ?   ? ?Emotional  Assessment ?Appearance:: Appears stated age ?Attitude/Demeanor/Rapport: Engaged, Gracious ?Affect (typically observed): Appropriate, Calm, Pleasant ?Orientation: : Oriented to Self, Oriented to Place, Oriented to  Time, Oriented to Situation ?Alcohol / Substance Use: Not Applicable ?Psych Involvement: No (comment) ? ?Admission diagnosis:  Acute and chronic respiratory failure (acute-on-chronic) ([place]) [J96.20] ?Acute on chronic respiratory failure with hypoxia (HCC) [J96.21] ?Atrial fibrillation, unspecified type ([place]) [I48.91] ?Patient Active Problem List  ? Diagnosis Date Noted  ? Elevated liver function tests 06/12/2021  ? Hypomagnesemia 06/11/2021  ? Hypotension 06/11/2021  ? Interstitial lung disease ([place]) 06/11/2021  ? Acute on chronic combined systolic and diastolic CHF (congestive heart failure) ([place]) 06/10/2021  ? Influenza A with pneumonia 03/07/2021  ? Acute on chronic respiratory failure with hypoxemia ([place]) 03/07/2021  ? Pressure injury of skin 01/13/2021  ? CKD (chronic kidney disease) stage 3, GFR 30-59 ml/min (HCC) 01/01/2021  ? AKI (acute kidney injury) ([place]) 12/27/2020  ? IPF (idiopathic pulmonary fibrosis) ([place]) 12/27/2020  ? Sacral decubitus ulcer 12/27/2020  ? Occult blood positive stool 12/27/2020  ? DDD (degenerative disc disease), cervical 12/21/2020  ? Hereditary and idiopathic peripheral neuropathy 12/21/2020  ? Localized, primary osteoarthritis 12/21/2020  ? Low back pain 12/21/2020  ? Pain in limb 12/21/2020  ? Chronic respiratory failure with hypoxia ([place]) 12/05/2020  ? Acute respiratory failure with hypoxia ([place]) 12/05/2020  ? Hip pain 08/06/2020  ? Spinal stenosis in cervical region 08/06/2020  ? Spondylolisthesis, congenital 08/06/2020  ? Status post total hip replacement, 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  ? Primary localized osteoarthritis of right hip 12/12/2016  ? B12 deficiency 10/28/2010  ? Coronary artery disease 10/28/2010  ? Uncontrolled type 2 diabetes mellitus with hyperglycemia, without  long-term current use of insulin ([place]) 10/28/2010  ? Gout 10/28/2010  ? Hyperlipidemia with target LDL less than 100 10/28/2010  ? Hypertension 10/28/2010  ? Osteoarthritis of left knee 10/28/2010  ? Anemia of chronic disease 10/28/2010  ? History of colonic polyps 10/28/2010  ? ?PCP:  Wardell Honour, MD ?Pharmacy[facility]   [id number] [place] [address]?Phone: [phone]: [phone]? ? ? ? ?Social Determinants of Health (SDOH) Interventions ?  ? ?Readmission Risk Interventions ?Readmission Risk Prevention Plan 06/13/2021 03/09/2021  ?Transportation Screening Complete Complete  ?PCP or Specialist Appt within 3-5 Days - Complete  ?HSta[place]or Home Care Consult - Complete  ?Social Work Consult for R[place]ersonPlanning/Counseling - Complete  ?Palliative Care Screening - Not Applicable  ?Medication Review (Press photographer Complete Complete  ?PCP or Specialist appointment within 3-5 days of discharge Complete -  ?HMagazineor Home Care Consult Complete -  ?SW Recovery Care/Counseling Consult Complete -  ?Palliative Care Screening Not Applicable -  ?SGriggstownPatient Refused -  ?Some recent data might be hidden  ? ? ? ?

## 2021-06-13 NOTE — Progress Notes (Signed)
Inpatient Diabetes Program Recommendations ? ?AACE/ADA: New Consensus Statement on Inpatient Glycemic Control (2015) ? ?Target Ranges:  Prepandial:   less than 140 mg/dL ?     Peak postprandial:   less than 180 mg/dL (1-2 hours) ?     Critically ill patients:  140 - 180 mg/dL  ? ? Latest Reference Range & Units 06/12/21 00:08 06/12/21 04:05 06/12/21 07:40 06/12/21 11:27 06/12/21 15:39 06/12/21 20:30  ?Glucose-Capillary 70 - 99 mg/dL 810 (H) ? ?3 units Novolog ? 102 (H) 114 (H) ? ? ? ?10 units Semglee @0929  252 (H) ? ?11 units Novolog ? 336 (H) ? ?15 units Novolog ? 89  ? ? Latest Reference Range & Units 06/12/21 23:25 06/13/21 04:10 06/13/21 08:07  ?Glucose-Capillary 70 - 99 mg/dL 08/13/21 (H) ? ?7 units Novolog ? 76 124 (H) ? ?3 units Novolog ?  ?(H): Data is abnormally high ? ? ? ?Admit with ?Acute on chronic respiratory failure with hypoxemia ?Acute on chronic combined systolic and diastolic CHF  ? ?History: DM, CKD, CHF ? ?Home DM Meds: Metformin 850 mg BID ? ?Current Orders: Novolog Resistant Correction Scale/ SSI (0-20 units) Q4 hours ? ? ?Getting Solumedrol 40 mg BID ? ?Current A1c level pending ? ? ? ?MD- Note pt eating 60-90% of solid PO diet and getting Glucerna PO supps TID between meals. ? ?Having afternoon CBGs elevations.   ? ?Please consider: ? ?1. Reduce Novolog SSI to the Moderate Scale (0-15 units) TID AC + HS ?Currently ordered as the 0-20 Resistant scale Q4 hours ? ?2. Start low dose Novolog Meal Coverage: Novolog 3 units TID with meals ?HOLD if pt eats <50% of meals ? ? ? ?--Will follow patient during hospitalization-- ? ?175 RN, MSN, CDE ?Diabetes Coordinator ?Inpatient Glycemic Control Team ?Team Pager: [phone] (8a-5p) ? ?

## 2021-06-13 NOTE — Care Management (Signed)
Cbg 52. Ordered amp of  D50. D/c the resistant SSI and placed on Sensitive SSI. ?

## 2021-06-13 NOTE — Progress Notes (Signed)
?Progress Note ? ? ?Patient: Sierra Guzman [id number] DOB: [birth year] DOA: 06/10/2021     3 ?DOS: the patient was seen and examined on 06/13/2021 ?  ? ? ?Assessment and Plan: ?* Acute on chronic respiratory failure with hypoxemia (HCC) ?Acute on chronic respiratory failure with hypoxemia.  The patient had respiratory distress on presentation requiring BiPAP.  Initial pulse ox 85%.  The patient states that she wears 2 L oxygen at rest and 4 L with ambulation.  Patient was on 4 L of oxygen this morning with shallow breaths. ? ? ?Acute on chronic combined systolic and diastolic CHF (congestive heart failure) ([place]) ?This echo showing EF of 30 to 35%.  Patient now on torsemide and Toprol-XL low-dose.  With heart rates elevated cardiology added low-dose digoxin.  Jardiance also added. ? ?Interstitial lung disease ([place]) ?Sepsis ruled out.  I think this is interstitial lung disease exacerbation also.  Pulmonary changed prednisone to Solu-Medrol. ? ?CKD (chronic kidney disease) stage 3, GFR 30-59 ml/min (HCC) ?Creatinine 1.0 today ? ?Uncontrolled type 2 diabetes mellitus with hyperglycemia, without long-term current use of insulin ([place]) ?Started Jardiance.  Continue sliding scale insulin while on steroids. ? ?Hypotension ?Low-dose metoprolol started today ? ?Anemia of chronic disease ?Anemia of chronic disease with last ferritin being elevated.  Last hemoglobin 9.2 ? ? ?Hypomagnesemia ?Replaced yesterday. ? ?Pressure injury of skin ?Stage II on sacrum and coccyx.  See full description below, present on admission. ? ?Coronary artery disease ?Borderline troponins likely demand ischemia with acute hypoxic respiratory failure ? ?Elevated liver function tests ?Likely secondary to CHF. ? ? ? ? ?  ? ?Subjective: Patient seen this morning and have rapid shallow breathing.  Long discussion with daughters on the phone about anxiety also playing a role.  We will try a little low-dose Xanax.  Patient's heart rate has been fast and  patient's blood pressure has been on the lower side.  Able to tolerate low-dose Toprol.  Patient and family interested in going home rather than rehab. ? ?Physical Exam: ?Vitals:  ? 06/13/21 0747 06/13/21 0806 06/13/21 0849 06/13/21 1225  ?BP:  114/78  96/60  ?Pulse:  (!) 110  61  ?Resp:  18  18  ?Temp:  97.6 ?F (36.4 ?C)  98 ?F (36.7 ?C)  ?TempSrc:      ?SpO2: 99% 100% 95% 100%  ?Weight:      ?Height:      ? ?Physical Exam ?HENT:  ?   Head: Normocephalic.  ?   Mouth/Throat:  ?   Pharynx: No oropharyngeal exudate.  ?Eyes:  ?   General: Lids are normal.  ?   Conjunctiva/sclera: Conjunctivae normal.  ?Cardiovascular:  ?   Rate and Rhythm: Regular rhythm. Tachycardia present.  ?   Heart sounds: Normal heart sounds, S1 normal and S2 normal.  ?Pulmonary:  ?   Effort: Accessory muscle usage present.  ?   Breath sounds: Examination of the right-lower field reveals decreased breath sounds and rhonchi. Examination of the left-lower field reveals decreased breath sounds and rhonchi. Decreased breath sounds and rhonchi present. No wheezing or rales.  ?Abdominal:  ?   Palpations: Abdomen is soft.  ?   Tenderness: There is no abdominal tenderness.  ?Musculoskeletal:  ?   Right lower leg: No swelling.  ?   Left lower leg: No swelling.  ?Skin: ?   General: Skin is warm.  ?   Findings: No rash.  ?Neurological:  ?   Mental Status: She is alert and  oriented to person, place, and time.  ?  ?Data Reviewed: ?Creatinine 1.0, hemoglobin 10.4 ? ?Family Communication: Spoke with 2 daughters on the phone ? ?Disposition: ?Status is: Inpatient ?Remains inpatient appropriate because: As lower ejection fraction than previously and using accessory muscles to breathe this morning. ? Planned Discharge Destination: Home ? ? ?Author: ?Loletha Grayer, MD ?06/13/2021 3:37 PM ? ?For on call review [email or web address].  ?

## 2021-06-13 NOTE — Evaluation (Signed)
Occupational Therapy Evaluation Patient Details Name: Sierra Guzman MRN: [id number] DOB: 02-24-1941 Today's Date: 06/13/2021   History of Present Illness Pt is an 81 y/o F admitted on 06/10/21 after presenting with c/c of SOB & noted to have low BP & tachycardia. Pt met sepsis criteria but this was later ruled out. Chest x-ray does show PNA. 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 PMH: anemia, chronic diastolic CHF, DM2, anxiety, asthma, CKD stage 3, lumbar DDD, CAD, gout, HLD, idiopathic pulmonary fibrosis, obesity, syncope      Clinical Impression   Pt was seen for OT evaluation this date. Prior to hospital admission, pt was mod indep at home, w/c for longer distances, and using 2-3L O2 at baseline, up to 4L with mobility. Pt lives with her daughter who works 12hr shifts. Pt received seated in recliner with nurse tech preparing to transfer her to the [facility]. Pt completed with CGA from OT. CGA for SPT for toileting via BSC, set up and SBA for lateral lean for pericare. MIN A for LB ADL tasks. Set up and PRN MIN A for seated UB dressing (to manage tele box and nasal cannula). With limited exertion, HR increases to 130's-160's (Afib). RN notified and in room at end of session. Per RN request, bumped up to 6L O2. SpO2 98%. Currently pt demonstrates impairments as described below (See OT problem list) which functionally limit her ability to perform ADL/self-care tasks safely. Pt would benefit from skilled OT services to address noted impairments and functional limitations (see below for any additional details) in order to maximize safety and independence while minimizing falls risk and caregiver burden. Pt primarily limited by HR this session, with improvement, perhaps could be safe to return home with increased PRN assist. At this time, upon hospital discharge, recommend STR to maximize pt safety and return to PLOF.       Recommendations for follow up therapy are one component of a multi-disciplinary discharge planning process, led by the attending physician.  Recommendations may be updated based on patient status, additional functional criteria and insurance authorization.   Follow Up Recommendations  Skilled nursing-short term rehab (<3 hours/day)    Assistance Recommended at Discharge Intermittent Supervision/Assistance  Patient can return home with the following A little help with walking and/or transfers;A little help with bathing/dressing/bathroom;Assistance with cooking/housework;Assist for transportation;Help with stairs or ramp for entrance;Direct supervision/assist for medications management    Functional Status Assessment  Patient has had a recent decline in their functional status and demonstrates the ability to make significant improvements in function in a reasonable and predictable amount of time.  Equipment Recommendations  BSC/3in1    Recommendations for Other Services       Precautions / Restrictions Precautions Precautions: Fall Precaution Comments: monitor HR! Restrictions Weight Bearing Restrictions: No      Mobility Bed Mobility               General bed mobility comments: NT in recliner at start and end of session    Transfers Overall transfer level: Needs assistance Equipment used: None Transfers: Sit to/from Stand, Bed to chair/wheelchair/BSC Sit to Stand: Min guard Stand pivot transfers: Min guard                Balance Overall balance assessment: Needs assistance Sitting-balance support: Feet supported, Bilateral upper extremity supported, No upper extremity supported Sitting balance-Leahy Scale: Good     Standing balance  support: During functional activity, No upper extremity supported Standing balance-Leahy Scale: Fair                             ADL either performed or assessed with clinical judgement   ADL Overall ADL's :  Needs assistance/impaired                                       General ADL Comments: CGA for SPT for toileting via BSC, set up and SBA for lateral lean for pericare. MIN A for LB ADL tasks. Set up and PRN MIN A for seated UB dressing (to manage tele box and nasal cannula)     Vision         Perception     Praxis      Pertinent Vitals/Pain Pain Assessment Pain Assessment: No/denies pain     Hand Dominance     Extremity/Trunk Assessment Upper Extremity Assessment Upper Extremity Assessment: Overall WFL for tasks assessed   Lower Extremity Assessment Lower Extremity Assessment: Generalized weakness;Overall WFL for tasks assessed       Communication Communication Communication: No difficulties   Cognition Arousal/Alertness: Awake/alert Behavior During Therapy: WFL for tasks assessed/performed, Anxious Overall Cognitive Status: Within Functional Limits for tasks assessed                                 General Comments: a little anxious, requested meds from RN     General Comments  HR generally 130's-150's up to 160's and 172 briefly (Afib) with SPT to [facility], RN notified and in room at end of session. Per RN request, bumped her up from 4L to 6L O2, SpO2 98%    Exercises Other Exercises Other Exercises: Pt educated in PLB to support breath recovery and minimize anxiety with movement, good carryover.   Shoulder Instructions      Home Living Family/patient expects to be discharged to:: Private residence Living Arrangements: Children Available Help at Discharge: Family;Available PRN/intermittently (daughter works 12 hr shifts & no one else can be home with her) Type of Home: House Home Access: Stairs to enter [employer] of Steps: 4 Entrance Stairs-Rails: Right;Left (wideset) Home Layout: One level               Home Equipment: Rollator (4 wheels);Wheelchair - manual          Prior Functioning/Environment Prior  Level of Function : Independent/Modified Independent             Mobility Comments: Pt reports she's independent in the home without AD, uses w/c for community mobility 2/2 "my oxygen drops", on 2-3L/min at rest at home but 4L/min with mobility          OT Problem List: Impaired balance (sitting and/or standing);Decreased knowledge of use of DME or AE;Decreased activity tolerance;Cardiopulmonary status limiting activity      OT Treatment/Interventions: Self-care/ADL training;Therapeutic exercise;Therapeutic activities;Energy conservation;DME and/or AE instruction;Patient/family education;Balance training    OT Goals(Current goals can be found in the care plan section) Acute Rehab OT Goals Patient Stated Goal: go home OT Goal Formulation: With patient Time For Goal Achievement: 06/27/21 Potential to Achieve Goals: Good ADL Goals Pt Will Transfer to Toilet: with modified independence;bedside commode;stand pivot transfer Pt Will Perform Toileting - Clothing Manipulation and hygiene: with modified  independence;with set-up;sitting/lateral leans Additional ADL Goal #1: Pt will complete dressing task with set up and supv for safety. Additional ADL Goal #2: Pt will complete seated bath with set up and supv for safety.  OT Frequency: Min 2X/week    Co-evaluation              AM-PAC OT "6 Clicks" Daily Activity     Outcome Measure Help from another person eating meals?: None Help from another person taking care of personal grooming?: None Help from another person toileting, which includes using toliet, bedpan, or urinal?: A Little Help from another person bathing (including washing, rinsing, drying)?: A Little Help from another person to put on and taking off regular upper body clothing?: A Little Help from another person to put on and taking off regular lower body clothing?: A Little 6 Click Score: 20   End of Session Equipment Utilized During Treatment: Oxygen Nurse  Communication: Other (comment) (HR)  Activity Tolerance: Treatment limited secondary to medical complications (Comment) (Afib) Patient left: in chair;with call bell/phone within reach;with nursing/sitter in room  OT Visit Diagnosis: Other abnormalities of gait and mobility (R26.89);Muscle weakness (generalized) (M62.81)                Time: 8138-8719 OT Time Calculation (min): 20 min Charges:  OT General Charges $OT Visit: 1 Visit OT Evaluation $OT Eval Moderate Complexity: 1 Mod OT Treatments $Self Care/Home Management : 8-22 mins  Ardeth Perfect., MPH, MS, OTR/L ascom [phone] 06/13/21, 1:01 PM

## 2021-06-13 NOTE — Progress Notes (Signed)
Physical Therapy Treatment ?Patient Details ?Name: Sierra Guzman ?MRN: [id number] ?DOB: 03/25/1941 ?Today's Date: 06/13/2021 ? ? ?History of Present Illness Pt is an 81 y/o F admitted on 06/10/21 after presenting with c/c of SOB & noted to have low BP & tachycardia. Pt met sepsis criteria but this was later ruled out. Chest x-ray does show PNA. 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 PMH: anemia, chronic diastolic CHF, DM2, anxiety, asthma, CKD stage 3, lumbar DDD, CAD, gout, HLD, idiopathic pulmonary fibrosis, obesity, syncope ? ?  ?PT Comments  ? ? Pt seen for PT tx after speaking with nurse re: pt's HR. Pt received in recliner reporting discomfort 2/2 sitting. BP 111/68 mmHg MAP 81. Nurse arrived & pt agreeable to getting back to bed to rest. Pt requires CGA for stand pivot & mod I for sit>supine. Further mobility deferred 2/2 elevated HR. Pt does endorse transfer chair>bed made her fatigued which is not her baseline level of mobility. Educated pt on recommendation of supervision at d/c & pt voices understanding. ? ?Pt on 4L/min via nasal cannula, increased to 6L/min after transferring to bed 2/2 low SpO2 reading of 80% with pt endorsing SOB but question accuracy of reading because on dinamap SpO2 >90%. Pt left on 4L/min. HR 120-150s during session.  ?   ?Recommendations for follow up therapy are one component of a multi-disciplinary discharge planning process, led by the attending physician.  Recommendations may be updated based on patient status, additional functional criteria and insurance authorization. ? ?Follow Up Recommendations ? Skilled nursing-short term rehab (<3 hours/day) (if pt declines SNF she will need HHPT f/u) ?  ?  ?Assistance Recommended at Discharge Intermittent Supervision/Assistance  ?Patient can return home with the following A little help with walking and/or transfers;A little help with  bathing/dressing/bathroom;Assistance with cooking/housework;Assist for transportation;Help with stairs or ramp for entrance ?  ?Equipment Recommendations ? None recommended by PT  ?  ?Recommendations for Other Services   ? ? ?  ?Precautions / Restrictions Precautions ?Precautions: Fall ?Precaution Comments: monitor HR! ?Restrictions ?Weight Bearing Restrictions: No  ?  ? ?Mobility ? Bed Mobility ?Overal bed mobility: Modified Independent ?Bed Mobility: Sit to Supine ?  ?  ?  ?Sit to supine: Modified independent (Device/Increase time) ?  ?  ?  ? ?Transfers ?Overall transfer level: Needs assistance ?Equipment used: None ?Transfers: Sit to/from Stand, Bed to chair/wheelchair/BSC ?Sit to Stand: Min guard ?Stand pivot transfers: Min guard ?  ?  ?  ?  ?  ?  ? ?Ambulation/Gait ?  ?  ?  ?  ?  ?  ?  ?  ? ? ?Stairs ?  ?  ?  ?  ?  ? ? ?Wheelchair Mobility ?  ? ?Modified Rankin (Stroke Patients Only) ?  ? ? ?  ?Balance   ?  ?  ?  ?  ?Standing balance support: Bilateral upper extremity supported, During functional activity, Single extremity supported ?Standing balance-Leahy Scale: Fair ?  ?  ?  ?  ?  ?  ?  ?  ?  ?  ?  ?  ?  ? ?  ?Cognition Arousal/Alertness: Awake/alert ?Behavior During Therapy: [facility] for tasks assessed/performed, Anxious ?Overall Cognitive Status: Within Functional Limits for tasks assessed ?  ?  ?  ?  ?  ?  ?  ?  ?  ?  ?  ?  ?  ?  ?  ?  ?  General Comments: good understanding of situation & recommendation for supervision/assist at d/c ?  ?  ? ?  ?Exercises   ? ?  ?General Comments General comments (skin integrity, edema, etc.): HR generally 130's-150's up to 160's and 172 briefly (Afib) with SPT to [facility], RN notified and in room at end of session. Per RN request, bumped her up from 4L to 6L O2, SpO2 98% ?  ?  ? ?Pertinent Vitals/Pain Pain Assessment ?Pain Assessment: Faces ?Faces Pain Scale: Hurts little more ?Pain Location: buttocks from sitting ?Pain Descriptors / Indicators: Discomfort ?Pain Intervention(s):  Repositioned  ? ? ?Home Living Family/patient expects to be discharged to:: Private residence ?Living Arrangements: Children ?Available Help at Discharge: Family;Available PRN/intermittently (daughter works 12 hr shifts & no one else can be home with her) ?Type of Home: House ?Home Access: Stairs to enter ?Entrance Stairs-Rails: Right;Left (wideset) ?Entrance Stairs-Number of Steps: 4 ?  ?Home Layout: One level ?Home Equipment: Rollator (4 wheels);Wheelchair - manual ?   ?  ?Prior Function    ?  ?  ?   ? ?PT Goals (current goals can now be found in the care plan section) Acute Rehab PT Goals ?Patient Stated Goal: get better, go home ?PT Goal Formulation: With patient ?Time For Goal Achievement: 06/26/21 ?Potential to Achieve Goals: Fair ?Progress towards PT goals: Progressing toward goals ? ?  ?Frequency ? ? ? Min 2X/week ? ? ? ?  ?PT Plan Discharge plan needs to be updated  ? ? ?Co-evaluation   ?  ?  ?  ?  ? ?  ?AM-PAC PT "6 Clicks" Mobility   ?Outcome Measure ? Help needed turning from your back to your side while in a flat bed without using bedrails?: None ?Help needed moving from lying on your back to sitting on the side of a flat bed without using bedrails?: A Little ?Help needed moving to and from a bed to a chair (including a wheelchair)?: A Little ?Help needed standing up from a chair using your arms (e.g., wheelchair or bedside chair)?: A Little ?Help needed to walk in hospital room?: A Little ?Help needed climbing 3-5 steps with a railing? : A Lot ?6 Click Score: 18 ? ?  ?End of Session Equipment Utilized During Treatment: Oxygen ?Activity Tolerance: Treatment limited secondary to medical complications (Comment) ?Patient left: in bed;with call bell/phone within reach;with bed alarm set;with nursing/sitter in room ?Nurse Communication: Mobility status ?PT Visit Diagnosis: Difficulty in walking, not elsewhere classified (R26.2);Muscle weakness (generalized) (M62.81) ?  ? ? ?Time: 0375-4360 ?PT Time  Calculation (min) (ACUTE ONLY): 12 min ? ?Charges:  $Therapeutic Activity: 8-22 mins          ?          ? ?Lavone Nian, PT, DPT ?06/13/21, 3:13 PM ? ? ? ?Waunita Schooner ?06/13/2021, 3:11 PM ? ?

## 2021-06-13 NOTE — Consult Note (Signed)
? ?  Heart Failure Nurse Navigator Note ? ?HFrEF 30-35%.  Grade 1 diastolic dysfunction.  Right ventricular systolic function is mildly reduced.  Moderately elevated pulmonary artery systolic pressures.  Moderate tricuspid regurgitation.  Mild mitral regurgitation. ? ?He presented with worsening progressive shortness of breath. ? ? ?Comorbidities: ? ?Asthma ?Anemia ?Anxiety ?Osteoarthritis ?Chronic kidney disease stage III ?Coronary artery disease ?Type 2 diabetes ?GERD ?Gout ?Hyperlipidemia ?Hypertension ?Pulmonary fibrosis ?Interstitial lung disease ? ?Medications: ? ?Jardiance 10 mg daily ?Metoprolol succinate 12 and half milligrams daily ?Potassium chloride 10 mEq twice daily ?Torsemide 20 mg 2 times a day ? ?GDMT is limited due to hypotension. ? ? ? ? ?Labs: ? ?Sodium 139, potassium 4.1, chloride 92, CO2 36, BUN 33, creatinine 1.00, GFR 57.  BNP on admission was 563 ?Weight was 62.9 kg ?Intake 590 mL ?Output 3800 mL ?Blood pressure 96/60 ? ? ?Initial meeting with patient and daughter [name] who was on the phone during the conversation. ? ?Both state they had not heard the term heart failure in regards to the patient. ?Went on to explain what the term heart failure means and the decreased function of patient's heart.  They both voiced understanding. ? ?Over the importance of daily weights, following a low-sodium diet and fluid restriction.  Daughter states that her mother eats a lot of fresh fruits and vegetables, no processed meats she has not used salt at the table for quite a few years. ? ?Discussed weight gain, signs and symptoms to report to physician. ? ?Also discussed the importance of remaining active is much as she can tolerate. ? ?Introduced the outpatient heart failure clinic to them, he has an appointment on March 17 at 10:30 in the morning.  She has a 21% no-show history with 10 out of 48 appointments. ? ?Was given the living with heart failure teaching booklet, zone magnet information on low-sodium  and heart failure. ? ?She voices interest in physical therapy, instructed that I would make her attending physician aware. ? ?Pricilla Riffle RN [facility] ? ?

## 2021-06-13 NOTE — Consult Note (Signed)
Cardiology Consultation:   Patient ID: RHYLI FERRERAS MRN: [id number]; DOB: November 08, 1940  Admit date: 06/10/2021 Date of Consult: 06/13/2021  PCP:  Wardell Honour, MD   [facility] Cardiologist:  Janina Mayo, MD   {  Patient Profile:   AYN CARRITHERS is a 81 y.o. female with a hx of chronic respiratory failure on home O2, 2 to 6 L, chronic diastolic heart failure, CAD, diabetes type 2, hypertension, PVCs, multifocal atrial tachycardia, OA, obesity, asthma, CKD stage III, progressive pulmonary fibrosis who is being seen 06/13/2021 for the evaluation of low EF at the request of Dr Leslye Peer.  History of Present Illness:   Ms. Abbas is followed by cardiology at [facility].  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  She has a history of CAD with 50% LAD and 50% D1 stenosis by cath in 2006.  Nuclear stress test in 2017 was nonischemic.  She is on beta-blocker for PACs.  Echo 12/09/2020 LVEF 60 to 65%, no wall motion abnormalities, grade 1 diastolic dysfunction, normal RV function, mild to moderate aortic valve sclerosis without stenosis.  She was seen by [facility] October 2022 during admission for shortness of breath.  EKG showed new T wave inversions in the inferior anterior leads.  Troponin was elevated however downtrending.  CTA showed coronary calcifications.  Echo showed LVEF 45 to 50% with RV dilated reduced function, which is mild decline from 12/09/2020.  She was treated with IV Lasix.  Cath was discussed however they opted to treat medically.  She was placed on guideline directed medical therapy.  She continues to follow-up outpatient with [facility] cardiology.  A nuclear stress test was ordered that showed LVEF 33%, no wall motion abnormalities, no ischemia or infarct.  The patient was admitted with increasing dyspnea despite titration of O2 at home. She had been following very closely with  outpatient pulmonology, but still developed dyespnea over the lat week. She was recommended ED evaluation.   In the ER she was tachypneic with increased work of breathing. Vitals showed she was afebrile, tachycardic to 130s, BP 116/63 96% on NRB. Labs showed K5, Scr 1.04, BUN 26, CO2 30, WBC 8.9, Hgb 9.2, HS trop 67, BNP 563, LA 2.7. COVID negative. CXR with chronic ILD. CTA chest showed no PE, stable pulmonary fibrosis& bibasilar bronchiectasis. Increased interlobular septal thickening and ground glass opacities within the mid & upper lung zones, possible superimposed edema.CCM was consulted for admission.   Past Medical History:  Diagnosis Date   Anemia 10/28/2010   unspecified   Anxiety    Aortic valve sclerosis    Arthritis    Asthma    Chronic combined systolic and diastolic CHF (congestive heart failure) ([place])    echo- 10/22 LVEF 45-55%, G1DD, moderately reduced RV fxn, mild aortic regurg/stenosis   Chronic kidney disease    Chronic Kidney Disease---Stage III   Coronary artery disease 10/28/2010   DDD (degenerative disc disease), lumbar 2006   Diabetes type 2, controlled ([place]) 10/28/2010   Dyspnea    GERD (gastroesophageal reflux disease)    Gout 10/28/2010   Heart murmur    History of cardiac catheterization 2006   LAD 50%, D1 50% rest normal.    History of cardiovascular stress test 09/23/2009    lexiscan Normal Cardiolite test, EF 61%. LV size and function - normal   History of echocardiogram 05/27/2008   EF 55%; Mild aortic insufficiency with minimal aortic  sclerosis. L vent normal.    Hyperlipidemia    Hyperlipidemia LDL goal <100 10/28/2010   managed by Dr. Dorena Bodo   Hypertension 10/28/2010   IPF (idiopathic pulmonary fibrosis) ([place])    Progressive, with UIP   Mild vitamin D deficiency 02/22/2011   Obesity (BMI 30-39.9) 10/28/2010   unspecified   Osteoarthritis 10/28/2010   bilateral knees, left hip   Personal history of colonic polyps 10/28/2010   Shortness of  breath    Syncope 2013   Vitamin B 12 deficiency 10/28/2010    Past Surgical History:  Procedure Laterality Date   BACK SURGERY  2006   DDD   BREAST BIOPSY Right    neg   CARDIAC CATHETERIZATION  2006   EYE SURGERY Bilateral    Cataract Extraction with IOL   JOINT REPLACEMENT Left    Left Total Hip Replacement   JOINT REPLACEMENT     RIGHT 2005; LEFT 2 X 2004, 2011   REPLACEMENT TOTAL KNEE Left    REVISION TOTAL KNEE ARTHROPLASTY Left    Dr. La Plena Right 12/12/2016   Procedure: TOTAL HIP ARTHROPLASTY ANTERIOR APPROACH;  Surgeon: Hessie Knows, MD;  Location: [facility] ORS;  Service: Orthopedics;  Laterality: Right;   TOTAL HIP ARTHROPLASTY Left 12/26/2013   Procedure: ARTHROPLASTY HIP TOTAL; Surgeon: Zorita Pang, MD; Location: [place]; Service: Orthopedics; Laterality: left,    TOTAL KNEE ARTHROPLASTY     both knees     Home Medications:  Prior to Admission medications   Medication Sig Start Date End Date Taking? Authorizing Provider  magnesium oxide (MAG-OX) 400 MG tablet Take 1 tablet by mouth daily. 07/21/20  Yes [provider]  sertraline (ZOLOFT) 25 MG tablet Take 25 mg by mouth daily. 06/03/21 06/03/22 Yes [provider]  acetaminophen (TYLENOL) 325 MG tablet Take 2 tablets (650 mg total) by mouth every 6 (six) hours as needed (pain). 01/17/21   Arrien, Jimmy Picket, MD  albuterol (ACCUNEB) 0.63 MG/3ML nebulizer solution Take 3 mLs by nebulization 3 (three) times daily.    [provider]  albuterol (PROVENTIL) (2.5 MG/3ML) 0.083% nebulizer solution Take 3 mLs (2.5 mg total) by nebulization every 2 (two) hours as needed for wheezing or shortness of breath. 03/10/21   Swayze, Ava, DO  allopurinol (ZYLOPRIM) 100 MG tablet Take 100 mg by mouth daily.    [provider]  aspirin EC 81 MG EC tablet Take 1 tablet (81 mg total) by mouth daily. Swallow whole. 01/18/21   Arrien, Jimmy Picket, MD   budesonide (PULMICORT) 0.25 MG/2ML nebulizer solution Take 2 mLs (0.25 mg total) by nebulization 2 (two) times daily. 03/10/21   Swayze, Ava, DO  cholecalciferol (VITAMIN D) 1000 units tablet Take 1,000 Units by mouth daily.    [provider]  COVID-19 mRNA bivalent vaccine, Pfizer, (PFIZER COVID-19 VAC BIVALENT) injection Inject into the muscle. 04/28/21   Carlyle Basques, MD  gabapentin (NEURONTIN) 100 MG capsule Take 1 capsule (100 mg total) by mouth 3 (three) times daily. Patient not taking: Reported on 06/13/2021 03/10/21   Swayze, Ava, DO  guaiFENesin-dextromethorphan (ROBITUSSIN DM) 100-10 MG/5ML syrup Take 5 mLs by mouth every 6 (six) hours as needed for cough. Patient not taking: Reported on 06/13/2021 01/17/21   Arrien, Jimmy Picket, MD  loratadine (CLARITIN) 10 MG tablet Take 10 mg by mouth daily.    [provider]  metFORMIN (GLUCOPHAGE) 850 MG tablet Take 1 tablet (850 mg total)  by mouth 2 (two) times daily with a meal. 01/19/21 01/19/22  Max Sane, MD  metoprolol succinate (TOPROL-XL) 25 MG 24 hr tablet Take 12.5 mg by mouth daily.    [provider]  mirtazapine (REMERON) 15 MG tablet Take 15 mg by mouth at bedtime. 06/02/21   [provider]  NAC 600 MG CAPS Take 600 mg by mouth 2 (two) times daily. 05/17/21   [provider]  Nystatin (GERHARDT'S BUTT CREAM) CREA Apply 1 application topically 3 (three) times daily. Patient not taking: Reported on 03/07/2021 01/03/21   Arrien, Jimmy Picket, MD  Omega-3 Fatty Acids (FISH OIL) 1000 MG CAPS Take 1,000 mg by mouth daily.    [provider]  omeprazole (PRILOSEC) 20 MG capsule Take 20 mg by mouth daily. 11/05/20   [provider]  Pirfenidone 267 MG TABS Take 267 mg by mouth 3 (three) times daily.    [provider]  polyethylene glycol (MIRALAX / GLYCOLAX) 17 g packet Take 17 g by mouth daily. 12/19/20   Shelly Coss, MD  predniSONE (DELTASONE) 10 MG tablet Take 1  tablet (10 mg total) by mouth daily. 03/10/21   Swayze, Ava, DO  rosuvastatin (CRESTOR) 40 MG tablet Take 40 mg by mouth daily. 09/17/20   [provider]  sodium chloride (OCEAN) 0.65 % SOLN nasal spray Place 2 sprays into both nostrils as needed for congestion. 03/10/21   Swayze, Ava, DO  Vitamin D, Ergocalciferol, (DRISDOL) 1.25 MG (50000 UNIT) CAPS capsule Take 1 capsule (50,000 Units total) by mouth every 7 (seven) days. Patient not taking: Reported on 03/07/2021 12/22/20   Shelly Coss, MD  enoxaparin (LOVENOX) 40 MG/0.4ML injection Inject 0.4 mLs (40 mg total) into the skin daily. 12/15/16 03/25/19  Duanne Guess, PA-C  pregabalin (LYRICA) 50 MG capsule Take 1 capsule (50 mg total) by mouth 3 (three) times daily. 12/18/16 03/25/19  Latanya Maudlin, NP  ranitidine (ZANTAC) 300 MG capsule Take 300 mg by mouth every evening.  03/25/19  [provider]  simvastatin (ZOCOR) 40 MG tablet Take 40 mg by mouth daily at 6 PM.  03/25/19  [provider]    Inpatient Medications: Scheduled Meds:  azithromycin  250 mg Oral Daily   budesonide (PULMICORT) nebulizer solution  0.25 mg Nebulization BID   Chlorhexidine Gluconate Cloth  6 each Topical Daily   dextromethorphan-guaiFENesin  1 tablet Oral BID   enoxaparin (LOVENOX) injection  40 mg Subcutaneous Q24H   feeding supplement (GLUCERNA SHAKE)  237 mL Oral TID BM   heparin  5,000 Units Subcutaneous Q8H   insulin aspart  0-20 Units Subcutaneous Q4H   ipratropium-albuterol  3 mL Nebulization TID   lidocaine  1 patch Transdermal Q24H   mouth rinse  15 mL Mouth Rinse BID   methylPREDNISolone (SOLU-MEDROL) injection  40 mg Intravenous Q12H   metoprolol succinate  12.5 mg Oral Daily   pantoprazole (PROTONIX) IV  40 mg Intravenous Q24H   potassium chloride  10 mEq Oral BID   sodium chloride flush  3 mL Intravenous Q12H   torsemide  20 mg Oral BID   Continuous Infusions:  sodium chloride     sodium chloride     PRN  Meds: sodium chloride, acetaminophen **OR** acetaminophen, ALPRAZolam, hydrALAZINE, ipratropium-albuterol, sodium chloride flush  Allergies:    Allergies  Allergen Reactions   Morphine And Related Nausea And Vomiting    Social History:   Social History   Socioeconomic History   Marital status: Married  Spouse name: Not on file   Number of children: Not on file   Years of education: Not on file   Highest education level: Not on file  Occupational History   Not on file  Tobacco Use   Smoking status: Former    Packs/day: 0.25    Years: 10.00    Pack years: 2.50    Types: Cigarettes    Quit date: 08/18/1990    Years since quitting: 30.8   Smokeless tobacco: Never  Vaping Use   Vaping Use: Never used  Substance and Sexual Activity   Alcohol use: No   Drug use: No   Sexual activity: Not Currently  Other Topics Concern   Not on file  Social History Narrative   Not on file   Social Determinants of Health   Financial Resource Strain: Not on file  Food Insecurity: Not on file  Transportation Needs: Not on file  Physical Activity: Not on file  Stress: Not on file  Social Connections: Not on file  Intimate Partner Violence: Not on file    Family History:    Family History  Problem Relation Age of Onset   Breast cancer Mother 41   Diabetes Mother    Stroke Mother    Stroke Brother    Breast cancer Cousin        pat cousin     ROS:  Please see the history of present illness.   All other ROS reviewed and negative.     Physical Exam/Data:   Vitals:   06/13/21 0500 06/13/21 0747 06/13/21 0806 06/13/21 0849  BP:   114/78   Pulse:   (!) 110   Resp:   18   Temp:   97.6 F (36.4 C)   TempSrc:      SpO2:  99% 100% 95%  Weight: 62.9 kg     Height:        Intake/Output Summary (Last 24 hours) at 06/13/2021 1200 Last data filed at 06/13/2021 0231 Gross per 24 hour  Intake 440 ml  Output 2300 ml  Net -1860 ml   Last 3 Weights 06/13/2021 06/12/2021 06/11/2021   Weight (lbs) 138 lb 10.7 oz 139 lb 5.3 oz 139 lb 12.4 oz  Weight (kg) 62.9 kg 63.2 kg 63.4 kg     Body mass index is 23.08 kg/m.  General:  Well nourished, well developed, in no acute distress HEENT: normal Neck: no JVD Vascular: No carotid bruits; Distal pulses 2+ bilaterally Cardiac:  normal S1, S2; RR, tachy; no murmur  Lungs:  diminished breath sounds, crackles Abd: soft, nontender, no hepatomegaly  Ext: no edema Musculoskeletal:  No deformities, BUE and BLE strength normal and equal Skin: warm and dry  Neuro:  CNs 2-12 intact, no focal abnormalities noted Psych:  Normal affect   EKG:  The EKG was personally reviewed and demonstrates:  Atrial tachycardia, 131bpm, q waves inf leads, possible LVH with repol Telemetry:  Telemetry was personally reviewed and demonstrates:  MAT/AT HR 100-130s  Relevant CV Studies:  Echo 06/11/21  1. Left ventricular ejection fraction, by estimation, is 30 to 35%. The  left ventricle has moderately decreased function. The left ventricle  demonstrates global hypokinesis. The left ventricular internal cavity size  was mildly dilated. Left ventricular  diastolic parameters are consistent with Grade I diastolic dysfunction  (impaired relaxation).   2. Right ventricular systolic function is mildly reduced. The right  ventricular size is mildly enlarged. Mildly increased right ventricular  wall thickness. There  is moderately elevated pulmonary artery systolic  pressure. The estimated right ventricular  systolic pressure is 123456 mmHg.   3. The mitral valve is normal in structure. Mild mitral valve  regurgitation. No evidence of mitral stenosis.   4. Tricuspid valve regurgitation is moderate.   5. The aortic valve is normal in structure. Aortic valve regurgitation is  not visualized. Aortic valve sclerosis/calcification is present, without  any evidence of aortic stenosis.   6. Compared to prior study dated 12/09/2020, EF has decreased  significantly.     Nuclear stress test 05/2019  Abnormal nuclear stress test. Moderate left ventricular  systolic function, LVEF [id number] no focal wall motion abnormalities. It is  worth noting that, in 123456 systolic function was normal by echo, so this  may be a poor assessment of EF.  No ischemia or infarct. Compared with prior study (nuclear stress test  [id number]), systolic function is significantly reduced (from normal), but  perfusion remains normal. Electronically Signed by:   Lenna Sciara. Newman Pies, MD [facility]  6:01 PM on 06/01/2021   Echo 01/13/21 1. Left ventricular ejection fraction, by estimation, is 45 to 50%. Left  ventricular ejection fraction by 3D volume is 45 %. The left ventricle has  mildly decreased function. The left ventricle has no regional wall motion  abnormalities. Left ventricular   diastolic parameters are consistent with Grade I diastolic dysfunction  (impaired relaxation).   2. Right ventricular systolic function is moderately reduced. The right  ventricular size is mildly enlarged. There is normal pulmonary artery  systolic pressure. The estimated right ventricular systolic pressure is  [id number] mmHg.   3. The mitral valve is grossly normal. Trivial mitral valve  regurgitation.   4. The aortic valve is calcified. There is severe calcifcation of the  aortic valve. There is moderate thickening of the aortic valve. Aortic  valve regurgitation is mild. Mild aortic valve stenosis.   Echo 12/09/20 1. Left ventricular ejection fraction, by estimation, is 60 to 65%. The  left ventricle has normal function. The left ventricle has no regional  wall motion abnormalities. Left ventricular diastolic parameters are  consistent with Grade I diastolic  dysfunction (impaired relaxation). The average left ventricular global  longitudinal strain is -16.6 %. The global longitudinal strain is normal.   2. Right ventricular systolic function is normal. The right ventricular  size is normal.   3. The  aortic valve is normal in structure. Aortic valve regurgitation is  not visualized. Mild to moderate aortic valve sclerosis/calcification is  present, without any evidence of aortic stenosis.   Laboratory Data:  High Sensitivity Troponin:   Recent Labs  Lab 06/10/21 2011 06/10/21 2222  TROPONINIHS 67* 61*     Chemistry Recent Labs  Lab 06/10/21 2011 06/11/21 0509 06/11/21 1210 06/12/21 0524 06/13/21 0959  NA  --  144 133* 140 139  K  --  3.9 4.1 4.1 4.1  CL  --  104 98 99 92*  CO2  --  33* 27 33* 36*  GLUCOSE  --  138* 341* 117* 264*  BUN  --  23 25* 29* 33*  CREATININE 0.91 0.81 1.19* 1.03* 1.00  CALCIUM  --  9.4 8.8* 9.5 9.5  MG 1.6* 3.7*  --   --   --   GFRNONAA >60 >60 46* 55* 57*  ANIONGAP  --  7 8 8 11     Recent Labs  Lab 06/10/21 1708 06/11/21 0509 06/12/21 0524  PROT 6.3* 6.4* 6.3*  ALBUMIN 2.9* 3.1*  3.0*  AST 81* 94* 155*  ALT 46* 74* 91*  ALKPHOS 47 57 57  BILITOT 1.0 0.7 0.4   Lipids No results for input(s): CHOL, TRIG, HDL, LABVLDL, LDLCALC, CHOLHDL in the last 168 hours.  Hematology Recent Labs  Lab 06/10/21 2011 06/11/21 0509 06/12/21 0524 06/13/21 0959  WBC 11.0* 12.0* 13.8*  --   RBC 2.89* 3.06* 2.94*  --   HGB 9.1* 9.5* 9.2* 10.4*  HCT 30.0* 31.1* 29.6*  --   MCV 103.8* 101.6* 100.7*  --   MCH 31.5 31.0 31.3  --   MCHC 30.3 30.5 31.1  --   RDW 14.5 14.3 14.1  --   PLT 211 237 224  --    Thyroid  Recent Labs  Lab 06/11/21 0509  TSH 1.020    BNP Recent Labs  Lab 06/10/21 2011 06/11/21 0509  BNP 563.3* 662.9*    DDimer No results for input(s): DDIMER in the last 168 hours.   Radiology/Studies:  CT Angio Chest PE W and/or Wo Contrast  Result Date: 06/10/2021 CLINICAL DATA:  Increasing shortness of breath, increasing oxygen requirement, history of pulmonary fibrosis EXAM: CT ANGIOGRAPHY CHEST WITH CONTRAST TECHNIQUE: Multidetector CT imaging of the chest was performed using the standard protocol during bolus administration  of intravenous contrast. Multiplanar CT image reconstructions and MIPs were obtained to evaluate the vascular anatomy. RADIATION DOSE REDUCTION: This exam was performed according to the departmental dose-optimization program which includes automated exposure control, adjustment of the mA and/or kV according to patient size and/or use of iterative reconstruction technique. CONTRAST:  79mL OMNIPAQUE IOHEXOL 350 MG/ML SOLN COMPARISON:  06/10/2021, 01/12/2021 FINDINGS: Cardiovascular: This is a technically adequate evaluation of the pulmonary vasculature. No filling defects or pulmonary emboli. Stable cardiomegaly without pericardial effusion. Normal caliber of the thoracic aorta. Stable atherosclerosis of the aortic arch and coronary vasculature. Mediastinum/Nodes: Decreased mediastinal adenopathy, measuring up to 1.9 cm in the subcarinal station and 1.4 cm in the precarinal region. Thyroid, trachea, and esophagus are unremarkable. Moderate hiatal hernia. Lungs/Pleura: Basilar predominant scarring and fibrosis again noted, with continued bibasilar bronchiectasis. There are areas of increased interlobular septal thickening and ground-glass airspace disease within the mid and upper lung zones, and superimposed edema cannot be excluded. No effusion or pneumothorax. Central airways are patent. Upper Abdomen: No acute abnormality. Musculoskeletal: No acute or destructive bony lesions. Reconstructed images demonstrate no additional findings. Review of the MIP images confirms the above findings. IMPRESSION: 1. No evidence of pulmonary embolus. 2. 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 3. Increased interlobular septal thickening and ground-glass opacities within the mid and upper lung zones, which could reflect superimposed edema. 4.  Aortic Atherosclerosis (ICD10-I70.0). Electronically Signed   By: Randa Ngo M.D.   On: 06/10/2021 19:14   DG Chest Port 1 View  Result Date:  06/10/2021 CLINICAL DATA:  Questionable sepsis, evaluate for abnormality. Increasing shortness of breath and oxygen requirement. History of pulmonary fibrosis. EXAM: PORTABLE CHEST 1 VIEW COMPARISON:  Most recent radiograph 05/09/2021, chest CT 01/12/2021 FINDINGS: Low lung volumes persist. Sequela of chronic interstitial lung disease with reticulation and basilar honeycombing. This is stable in appearance from prior exam. Similar cardiomegaly with unchanged mediastinal contours. No superimposed airspace disease, large pleural effusion or pneumothorax. Grossly stable osseous structures. IMPRESSION: Chronic interstitial lung disease, similar in appearance to prior. No evidence of superimposed acute process. Electronically Signed   By: Keith Rake M.D.   On: 06/10/2021 17:12   ECHOCARDIOGRAM COMPLETE  Result Date:  06/11/2021    ECHOCARDIOGRAM REPORT   Patient Name:   KATYA REAVEY Date of Exam: 06/11/2021 Medical Rec #:  [id number]    Height:       65.0 in Accession #:    [id number]   Weight:       139.8 lb Date of Birth:  05-May-1940    BSA:          1.699 m Patient Age:    34 years     BP:           89/57 mmHg Patient Gender: F            HR:           106 bpm. Exam Location:  [facility] Procedure: 2D Echo, Cardiac Doppler and Color Doppler Indications:     CHF-Acute Systolic 123456 / [id number]  History:         Patient has prior history of Echocardiogram examinations. CAD;                  Signs/Symptoms:Shortness of Breath and Syncope.  Sonographer:     Alyse Low Roar Referring Phys:  Sebastian Diagnosing Phys: Ida Rogue MD IMPRESSIONS  1. Left ventricular ejection fraction, by estimation, is 30 to 35%. The left ventricle has moderately decreased function. The left ventricle demonstrates global hypokinesis. The left ventricular internal cavity size was mildly dilated. Left ventricular diastolic parameters are consistent with Grade I diastolic dysfunction (impaired relaxation).  2. Right ventricular systolic  function is mildly reduced. The right ventricular size is mildly enlarged. Mildly increased right ventricular wall thickness. There is moderately elevated pulmonary artery systolic pressure. The estimated right ventricular systolic pressure is 123456 mmHg.  3. The mitral valve is normal in structure. Mild mitral valve regurgitation. No evidence of mitral stenosis.  4. Tricuspid valve regurgitation is moderate.  5. The aortic valve is normal in structure. Aortic valve regurgitation is not visualized. Aortic valve sclerosis/calcification is present, without any evidence of aortic stenosis.  6. Compared to prior study dated 12/09/2020, EF has decreased significantly. FINDINGS  Left Ventricle: Left ventricular ejection fraction, by estimation, is 30 to 35%. The left ventricle has moderately decreased function. The left ventricle demonstrates global hypokinesis. The left ventricular internal cavity size was mildly dilated. There is no left ventricular hypertrophy. Left ventricular diastolic parameters are consistent with Grade I diastolic dysfunction (impaired relaxation). Right Ventricle: The right ventricular size is mildly enlarged. Mildly increased right ventricular wall thickness. Right ventricular systolic function is mildly reduced. There is moderately elevated pulmonary artery systolic pressure. The tricuspid regurgitant velocity is 3.18 m/s, and with an assumed right atrial pressure of 5 mmHg, the estimated right ventricular systolic pressure is 123456 mmHg. Left Atrium: Left atrial size was normal in size. Right Atrium: Right atrial size was normal in size. Pericardium: There is no evidence of pericardial effusion. Mitral Valve: The mitral valve is normal in structure. Mild mitral valve regurgitation. No evidence of mitral valve stenosis. Tricuspid Valve: The tricuspid valve is normal in structure. Tricuspid valve regurgitation is moderate . No evidence of tricuspid stenosis. Aortic Valve: The aortic valve is normal  in structure. Aortic valve regurgitation is not visualized. Aortic valve sclerosis/calcification is present, without any evidence of aortic stenosis. Aortic valve peak gradient measures 12.7 mmHg. Pulmonic Valve: The pulmonic valve was normal in structure. Pulmonic valve regurgitation is mild. No evidence of pulmonic stenosis. Aorta: The aortic root is normal in size and structure. IAS/Shunts: No atrial level  shunt detected by color flow Doppler.  LEFT VENTRICLE PLAX 2D LVIDd:         4.63 cm      Diastology LVIDs:         3.91 cm      LV e' medial:    4.24 cm/s LV PW:         0.84 cm      LV E/e' medial:  13.6 LV IVS:        0.95 cm      LV e' lateral:   7.29 cm/s LVOT diam:     2.10 cm      LV E/e' lateral: 7.9 LVOT Area:     3.46 cm  LV Volumes (MOD) LV vol d, MOD A2C: 104.0 ml LV vol d, MOD A4C: 104.0 ml LV vol s, MOD A2C: 64.3 ml LV vol s, MOD A4C: 68.3 ml LV SV MOD A2C:     39.7 ml LV SV MOD A4C:     104.0 ml LV SV MOD BP:      37.2 ml RIGHT VENTRICLE RV Basal diam:  3.24 cm RV Mid diam:    2.59 cm RV S prime:     13.90 cm/s TAPSE (M-mode): 1.9 cm LEFT ATRIUM             Index        RIGHT ATRIUM           Index LA diam:        4.50 cm 2.65 cm/m   RA Area:     14.90 cm LA Vol (A2C):   56.2 ml 33.08 ml/m  RA Volume:   37.60 ml  22.13 ml/m LA Vol (A4C):   51.1 ml 30.08 ml/m LA Biplane Vol: 56.4 ml 33.20 ml/m  AORTIC VALVE                 PULMONIC VALVE AV Area (Vmax): 1.48 cm     PV Vmax:          0.91 m/s AV Vmax:        178.00 cm/s  PV Peak grad:     3.3 mmHg AV Peak Grad:   12.7 mmHg    PR End Diast Vel: 15.84 msec LVOT Vmax:      75.90 cm/s   RVOT Peak grad:   1 mmHg  AORTA Ao Root diam: 2.50 cm Ao Asc diam:  2.60 cm MITRAL VALVE               TRICUSPID VALVE MV Area (PHT): 5.50 cm    TR Peak grad:   40.4 mmHg MV Decel Time: 138 msec    TR Vmax:        318.00 cm/s MV E velocity: 57.50 cm/s MV A velocity: 93.00 cm/s  SHUNTS MV E/A ratio:  0.62        Systemic Diam: 2.10 cm MV A Prime:    11.6 cm/s  Ida Rogue MD Electronically signed by Ida Rogue MD Signature Date/Time: 06/11/2021/4:42:08 PM    Final      Assessment and Plan:   Cardiomyopathy EF 30-35% Acute on chronic HFrEF - progressively worsening EF since 12/2020 at which time EF was normal.  - echo 01/2021 showed EF 45-50% at which time family opted for medical management. 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

## 2021-06-13 NOTE — Progress Notes (Signed)
PULMONOLOGY         Date: 06/13/2021,   MRN# [id number] Sierra Guzman 1941/02/04     AdmissionWeight: 63.9 kg                 CurrentWeight: 62.9 kg   Referring physician: Dr Leslye Peer   CHIEF COMPLAINT:   Acute on chronic hypoxemic respiratory failure   HISTORY OF PRESENT ILLNESS   This 81 year old female with a history of interstitial lung disease and chronic hypoxemia on supplemental O2 at with recurrent exacerbations, CHF with reduced LVEF at 45%, chronic diabetes mellitus.  She was evaluated by me in the office and at that time was in mild to moderate respiratory distress and acute on chronic hypoxemia but adamantly declined being hospitalized at that time.  During outpatient visit she did receive intramuscular 20 mg Solu-Medrol as well as increased supplemental oxygen and half liter of resuscitative fluids due to clinical findings of dehydration including skin tenting on all 4 extremities with poor urine output per family.  After this outpatient treatment she improved substantially and actually was able to take a nap in our clinic for over 2 hours while the nurse watched over her.  This improvement however was very transient and short-lived and patient again became worse and finally did come to the ER.  She received nonrebreather in the emergency room as well as resuscitative fluids and stated she improved.  Family reports severe anxiety disorder with recurrent panic attacks.  Patient also received 2 L of resuscitative fluids on admission as well as empiric antibiotics with vancomycin, cefepime, azithromycin.  She was admitted to the medical floor and pulmonary consultation was placed due to complex pulmonary history with interstitial lung disease, chronic hypoxemia and recurrent exacerbations of underlying ILD.   06/13/21- patient is slightly improved s/p >6L urine output with diuresis.  She is nonlabored nontachypneic during my evaluation.  She had cardiology evaluation today.   Myself and Dr Fletcher Anon spoke with [name] on phone regarding palliative care evaluation   PAST MEDICAL HISTORY   Past Medical History:  Diagnosis Date   Anemia 10/28/2010   unspecified   Anxiety    Aortic valve sclerosis    Arthritis    Asthma    Chronic combined systolic and diastolic CHF (congestive heart failure) ([place])    echo- 10/22 LVEF 45-55%, G1DD, moderately reduced RV fxn, mild aortic regurg/stenosis   Chronic kidney disease    Chronic Kidney Disease---Stage III   Coronary artery disease 10/28/2010   DDD (degenerative disc disease), lumbar 2006   Diabetes type 2, controlled ([place]) 10/28/2010   Dyspnea    GERD (gastroesophageal reflux disease)    Gout 10/28/2010   Heart murmur    History of cardiac catheterization 2006   LAD 50%, D1 50% rest normal.    History of cardiovascular stress test 09/23/2009    lexiscan Normal Cardiolite test, EF 61%. LV size and function - normal   History of echocardiogram 05/27/2008   EF 55%; Mild aortic insufficiency with minimal aortic sclerosis. L vent normal.    Hyperlipidemia    Hyperlipidemia LDL goal <100 10/28/2010   managed by Dr. Dorena Bodo   Hypertension 10/28/2010   IPF (idiopathic pulmonary fibrosis) ([place])    Progressive, with UIP   Mild vitamin D deficiency 02/22/2011   Obesity (BMI 30-39.9) 10/28/2010   unspecified   Osteoarthritis 10/28/2010   bilateral knees, left hip   Personal history of colonic polyps 10/28/2010   Shortness of breath  Syncope 2013   Vitamin B 12 deficiency 10/28/2010     SURGICAL HISTORY   Past Surgical History:  Procedure Laterality Date   BACK SURGERY  2006   DDD   BREAST BIOPSY Right    neg   CARDIAC CATHETERIZATION  2006   EYE SURGERY Bilateral    Cataract Extraction with IOL   JOINT REPLACEMENT Left    Left Total Hip Replacement   JOINT REPLACEMENT     RIGHT 2005; LEFT 2 X 2004, 2011   REPLACEMENT TOTAL KNEE Left    REVISION TOTAL KNEE ARTHROPLASTY Left    Dr. [place] Right 12/12/2016   Procedure: TOTAL HIP ARTHROPLASTY ANTERIOR APPROACH;  Surgeon: Hessie Knows, MD;  Location: [facility] ORS;  Service: Orthopedics;  Laterality: Right;   TOTAL HIP ARTHROPLASTY Left 12/26/2013   Procedure: ARTHROPLASTY HIP TOTAL; Surgeon: Zorita Pang, MD; Location: [place]; Service: Orthopedics; Laterality: left,    TOTAL KNEE ARTHROPLASTY     both knees     FAMILY HISTORY   Family History  Problem Relation Age of Onset   Breast cancer Mother 52   Diabetes Mother    Stroke Mother    Stroke Brother    Breast cancer Cousin        pat cousin     SOCIAL HISTORY   Social History   Tobacco Use   Smoking status: Former    Packs/day: 0.25    Years: 10.00    Pack years: 2.50    Types: Cigarettes    Quit date: 08/18/1990    Years since quitting: 30.8   Smokeless tobacco: Never  Vaping Use   Vaping Use: Never used  Substance Use Topics   Alcohol use: No   Drug use: No     MEDICATIONS    Home Medication:    Current Medication:  Current Facility-Administered Medications:    0.9 %  sodium chloride infusion, 250 mL, Intravenous, PRN, Posey Pronto, Ekta V, MD   0.9 %  sodium chloride infusion, 250 mL, Intravenous, Continuous, Rust-Chester, Toribio Harbour L, NP   acetaminophen (TYLENOL) tablet 650 mg, 650 mg, Oral, Q6H PRN, 650 mg at 06/11/21 2206 **OR** acetaminophen (TYLENOL) suppository 650 mg, 650 mg, Rectal, Q6H PRN, Para Skeans, MD   azithromycin (ZITHROMAX) tablet 250 mg, 250 mg, Oral, Daily, Wieting, Richard, MD, 250 mg at 06/12/21 1710   budesonide (PULMICORT) nebulizer solution 0.25 mg, 0.25 mg, Nebulization, BID, Rust-Chester, Britton L, NP, 0.25 mg at 06/13/21 0743   Chlorhexidine Gluconate Cloth 2 % PADS 6 each, 6 each, Topical, Daily, Rust-Chester, Britton L, NP, 6 each at 06/12/21 0929   dextromethorphan-guaiFENesin ([place] DM) 30-600 MG per 12 hr tablet 1 tablet, 1 tablet, Oral, BID, Rust-Chester, Britton L,  NP, 1 tablet at 06/12/21 2114   feeding supplement (GLUCERNA SHAKE) (GLUCERNA SHAKE) liquid 237 mL, 237 mL, Oral, TID BM, Wieting, Richard, MD, 237 mL at 06/12/21 2033   heparin injection 5,000 Units, 5,000 Units, Subcutaneous, Q8H, Florina Ou V, MD, 5,000 Units at 06/13/21 0518   hydrALAZINE (APRESOLINE) injection 5 mg, 5 mg, Intravenous, Q4H PRN, Para Skeans, MD   hydrOXYzine (ATARAX) tablet 25 mg, 25 mg, Oral, TID PRN, Rust-Chester, Britton L, NP, 25 mg at 06/12/21 1711   insulin aspart (novoLOG) injection 0-20 Units, 0-20 Units, Subcutaneous, Q4H, Rust-Chester, Britton L, NP, 7 Units at 06/12/21 2350   ipratropium-albuterol (DUONEB) 0.5-2.5 (3) MG/3ML nebulizer solution 3 mL, 3 mL, Nebulization,  TID, Bennie Pierini, MD, 3 mL at 06/13/21 0743   ipratropium-albuterol (DUONEB) 0.5-2.5 (3) MG/3ML nebulizer solution 3 mL, 3 mL, Nebulization, Q4H PRN, Schertz, Michele Mcalpine, MD   lidocaine (LIDODERM) 5 % 1 patch, 1 patch, Transdermal, Q24H, Wieting, Richard, MD, 1 patch at 06/12/21 1113   MEDLINE mouth rinse, 15 mL, Mouth Rinse, BID, Rust-Chester, Toribio Harbour L, NP, 15 mL at 06/12/21 2115   methylPREDNISolone sodium succinate (SOLU-MEDROL) 40 mg/mL injection 40 mg, 40 mg, Intravenous, Q12H, Ottie Glazier, MD, 40 mg at 06/13/21 0519   metoprolol succinate (TOPROL-XL) 24 hr tablet 12.5 mg, 12.5 mg, Oral, Daily, Wieting, Richard, MD, 12.5 mg at 06/12/21 1113   pantoprazole (PROTONIX) injection 40 mg, 40 mg, Intravenous, Q24H, Rust-Chester, Britton L, NP, 40 mg at 06/12/21 2314   potassium chloride (KLOR-CON M) CR tablet 10 mEq, 10 mEq, Oral, BID, Wieting, Richard, MD, 10 mEq at 06/12/21 2114   sodium chloride flush (NS) 0.9 % injection 3 mL, 3 mL, Intravenous, Q12H, Florina Ou V, MD, 3 mL at 06/12/21 2116   sodium chloride flush (NS) 0.9 % injection 3 mL, 3 mL, Intravenous, PRN, Para Skeans, MD   torsemide [facility]) tablet 20 mg, 20 mg, Oral, BID, Leslye Peer, Richard, MD, 20 mg at 06/12/21  1710    ALLERGIES   Morphine and related     REVIEW OF SYSTEMS    Review of Systems:  Gen:  Denies  fever, sweats, chills weigh loss  HEENT: Denies blurred vision, double vision, ear pain, eye pain, hearing loss, nose bleeds, sore throat Cardiac:  No dizziness, chest pain or heaviness, chest tightness,edema Resp:   Denies cough or sputum porduction, shortness of breath,wheezing, hemoptysis,  Gi: Denies swallowing difficulty, stomach pain, nausea or vomiting, diarrhea, constipation, bowel incontinence Gu:  Denies bladder incontinence, burning urine Ext:   Denies Joint pain, stiffness or swelling Skin: Denies  skin rash, easy bruising or bleeding or hives Endoc:  Denies polyuria, polydipsia , polyphagia or weight change Psych:   Denies depression, insomnia or hallucinations   Other:  All other systems negative   VS: BP 114/78 (BP Location: Left Arm)    Pulse (!) 110    Temp 97.6 F (36.4 C)    Resp 18    Ht 5\' 5"  (1.651 m)    Wt 62.9 kg    SpO2 100%    BMI 23.08 kg/m      PHYSICAL EXAM    GENERAL:NAD, no fevers, chills, no weakness no fatigue HEAD: Normocephalic, atraumatic.  EYES: Pupils equal, round, reactive to light. Extraocular muscles intact. No scleral icterus.  MOUTH: Moist mucosal membrane. Dentition intact. No abscess noted.  EAR, NOSE, THROAT: Clear without exudates. No external lesions.  NECK: Supple. No thyromegaly. No nodules. No JVD.  PULMONARY: crackles at bases bilaterally  CARDIOVASCULAR: S1 and S2. Regular rate and rhythm. No murmurs, rubs, or gallops. No edema. Pedal pulses 2+ bilaterally.  GASTROINTESTINAL: Soft, nontender, nondistended. No masses. Positive bowel sounds. No hepatosplenomegaly.  MUSCULOSKELETAL: No swelling, clubbing, or edema. Range of motion full in all extremities.  NEUROLOGIC: Cranial nerves II through XII are intact. No gross focal neurological deficits. Sensation intact. Reflexes intact.  SKIN: No ulceration, lesions, rashes,  or cyanosis. Skin warm and dry. Turgor intact.  PSYCHIATRIC: Mood, affect within normal limits. The patient is awake, alert and oriented x 3. Insight, judgment intact.       IMAGING    CT Angio Chest PE W and/or Wo Contrast  Result Date: 06/10/2021  CLINICAL DATA:  Increasing shortness of breath, increasing oxygen requirement, history of pulmonary fibrosis EXAM: CT ANGIOGRAPHY CHEST WITH CONTRAST TECHNIQUE: Multidetector CT imaging of the chest was performed using the standard protocol during bolus administration of intravenous contrast. Multiplanar CT image reconstructions and MIPs were obtained to evaluate the vascular anatomy. RADIATION DOSE REDUCTION: This exam was performed according to the departmental dose-optimization program which includes automated exposure control, adjustment of the mA and/or kV according to patient size and/or use of iterative reconstruction technique. CONTRAST:  73mL OMNIPAQUE IOHEXOL 350 MG/ML SOLN COMPARISON:  06/10/2021, 01/12/2021 FINDINGS: Cardiovascular: This is a technically adequate evaluation of the pulmonary vasculature. No filling defects or pulmonary emboli. Stable cardiomegaly without pericardial effusion. Normal caliber of the thoracic aorta. Stable atherosclerosis of the aortic arch and coronary vasculature. Mediastinum/Nodes: Decreased mediastinal adenopathy, measuring up to 1.9 cm in the subcarinal station and 1.4 cm in the precarinal region. Thyroid, trachea, and esophagus are unremarkable. Moderate hiatal hernia. Lungs/Pleura: Basilar predominant scarring and fibrosis again noted, with continued bibasilar bronchiectasis. There are areas of increased interlobular septal thickening and ground-glass airspace disease within the mid and upper lung zones, and superimposed edema cannot be excluded. No effusion or pneumothorax. Central airways are patent. Upper Abdomen: No acute abnormality. Musculoskeletal: No acute or destructive bony lesions. Reconstructed images  demonstrate no additional findings. Review of the MIP images confirms the above findings. IMPRESSION: 1. No evidence of pulmonary embolus. 2. 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 3. Increased interlobular septal thickening and ground-glass opacities within the mid and upper lung zones, which could reflect superimposed edema. 4.  Aortic Atherosclerosis (ICD10-I70.0). Electronically Signed   By: Randa Ngo M.D.   On: 06/10/2021 19:14   DG Chest Port 1 View  Result Date: 06/10/2021 CLINICAL DATA:  Questionable sepsis, evaluate for abnormality. Increasing shortness of breath and oxygen requirement. History of pulmonary fibrosis. EXAM: PORTABLE CHEST 1 VIEW COMPARISON:  Most recent radiograph 05/09/2021, chest CT 01/12/2021 FINDINGS: Low lung volumes persist. Sequela of chronic interstitial lung disease with reticulation and basilar honeycombing. This is stable in appearance from prior exam. Similar cardiomegaly with unchanged mediastinal contours. No superimposed airspace disease, large pleural effusion or pneumothorax. Grossly stable osseous structures. IMPRESSION: Chronic interstitial lung disease, similar in appearance to prior. No evidence of superimposed acute process. Electronically Signed   By: Keith Rake M.D.   On: 06/10/2021 17:12   ECHOCARDIOGRAM COMPLETE  Result Date: 06/11/2021    ECHOCARDIOGRAM REPORT   Patient Name:   Sierra Guzman Date of Exam: 06/11/2021 Medical Rec #:  [id number]    Height:       65.0 in Accession #:    [id number]   Weight:       139.8 lb Date of Birth:  11-11-1940    BSA:          1.699 m Patient Age:    42 years     BP:           89/57 mmHg Patient Gender: F            HR:           106 bpm. Exam Location:  [facility] Procedure: 2D Echo, Cardiac Doppler and Color Doppler Indications:     CHF-Acute Systolic [id number] / [id number]  History:         Patient has prior history of Echocardiogram examinations. CAD;                   Signs/Symptoms:Shortness  of Breath and Syncope.  Sonographer:     Alyse Low Roar Referring Phys:  [place] Diagnosing Phys: Ida Rogue MD IMPRESSIONS  1. Left ventricular ejection fraction, by estimation, is 30 to 35%. The left ventricle has moderately decreased function. The left ventricle demonstrates global hypokinesis. The left ventricular internal cavity size was mildly dilated. Left ventricular diastolic parameters are consistent with Grade I diastolic dysfunction (impaired relaxation).  2. Right ventricular systolic function is mildly reduced. The right ventricular size is mildly enlarged. Mildly increased right ventricular wall thickness. There is moderately elevated pulmonary artery systolic pressure. The estimated right ventricular systolic pressure is 123456 mmHg.  3. The mitral valve is normal in structure. Mild mitral valve regurgitation. No evidence of mitral stenosis.  4. Tricuspid valve regurgitation is moderate.  5. The aortic valve is normal in structure. Aortic valve regurgitation is not visualized. Aortic valve sclerosis/calcification is present, without any evidence of aortic stenosis.  6. Compared to prior study dated 12/09/2020, EF has decreased significantly. FINDINGS  Left Ventricle: Left ventricular ejection fraction, by estimation, is 30 to 35%. The left ventricle has moderately decreased function. The left ventricle demonstrates global hypokinesis. The left ventricular internal cavity size was mildly dilated. There is no left ventricular hypertrophy. Left ventricular diastolic parameters are consistent with Grade I diastolic dysfunction (impaired relaxation). Right Ventricle: The right ventricular size is mildly enlarged. Mildly increased right ventricular wall thickness. Right ventricular systolic function is mildly reduced. There is moderately elevated pulmonary artery systolic pressure. The tricuspid regurgitant velocity is 3.18 m/s, and with an assumed right atrial pressure  of 5 mmHg, the estimated right ventricular systolic pressure is 123456 mmHg. Left Atrium: Left atrial size was normal in size. Right Atrium: Right atrial size was normal in size. Pericardium: There is no evidence of pericardial effusion. Mitral Valve: The mitral valve is normal in structure. Mild mitral valve regurgitation. No evidence of mitral valve stenosis. Tricuspid Valve: The tricuspid valve is normal in structure. Tricuspid valve regurgitation is moderate . No evidence of tricuspid stenosis. Aortic Valve: The aortic valve is normal in structure. Aortic valve regurgitation is not visualized. Aortic valve sclerosis/calcification is present, without any evidence of aortic stenosis. Aortic valve peak gradient measures 12.7 mmHg. Pulmonic Valve: The pulmonic valve was normal in structure. Pulmonic valve regurgitation is mild. No evidence of pulmonic stenosis. Aorta: The aortic root is normal in size and structure. IAS/Shunts: No atrial level shunt detected by color flow Doppler.  LEFT VENTRICLE PLAX 2D LVIDd:         4.63 cm      Diastology LVIDs:         3.91 cm      LV e' medial:    4.24 cm/s LV PW:         0.84 cm      LV E/e' medial:  13.6 LV IVS:        0.95 cm      LV e' lateral:   7.29 cm/s LVOT diam:     2.10 cm      LV E/e' lateral: 7.9 LVOT Area:     3.46 cm  LV Volumes (MOD) LV vol d, MOD A2C: 104.0 ml LV vol d, MOD A4C: 104.0 ml LV vol s, MOD A2C: 64.3 ml LV vol s, MOD A4C: 68.3 ml LV SV MOD A2C:     39.7 ml LV SV MOD A4C:     104.0 ml LV SV MOD BP:  37.2 ml RIGHT VENTRICLE RV Basal diam:  3.24 cm RV Mid diam:    2.59 cm RV S prime:     13.90 cm/s TAPSE (M-mode): 1.9 cm LEFT ATRIUM             Index        RIGHT ATRIUM           Index LA diam:        4.50 cm 2.65 cm/m   RA Area:     14.90 cm LA Vol (A2C):   56.2 ml 33.08 ml/m  RA Volume:   37.60 ml  22.13 ml/m LA Vol (A4C):   51.1 ml 30.08 ml/m LA Biplane Vol: 56.4 ml 33.20 ml/m  AORTIC VALVE                 PULMONIC VALVE AV Area (Vmax): 1.48  cm     PV Vmax:          0.91 m/s AV Vmax:        178.00 cm/s  PV Peak grad:     3.3 mmHg AV Peak Grad:   12.7 mmHg    PR End Diast Vel: 15.84 msec LVOT Vmax:      75.90 cm/s   RVOT Peak grad:   1 mmHg  AORTA Ao Root diam: 2.50 cm Ao Asc diam:  2.60 cm MITRAL VALVE               TRICUSPID VALVE MV Area (PHT): 5.50 cm    TR Peak grad:   40.4 mmHg MV Decel Time: 138 msec    TR Vmax:        318.00 cm/s MV E velocity: 57.50 cm/s MV A velocity: 93.00 cm/s  SHUNTS MV E/A ratio:  0.62        Systemic Diam: 2.10 cm MV A Prime:    11.6 cm/s Ida Rogue MD Electronically signed by Ida Rogue MD Signature Date/Time: 06/11/2021/4:42:08 PM    Final     Ref Range & Units 1 yr ago  Anti-JO 1 Ab <20 Units <20   Anti-PL-7 Ab Negative Negative   Comment: This test was developed and its performance characteristics  determined by [facility]. It has not been cleared or  approved by the Food and Drug Administration.  Anti-PL-12 Ab Negative Negative   Comment: This test was developed and its performance characteristics  determined by [facility]. It has not been cleared or  approved by the Food and Drug Administration.  Anti-EJ Ab Negative Negative   Comment: This test was developed and its performance characteristics  determined by [facility]. It has not been cleared or  approved by the Food and Drug Administration.  Anti-OJ Ab Negative Negative   Comment: This test was developed and its performance characteristics  determined by [facility]. It has not been cleared or  approved by the Food and Drug Administration.  Anti-SRP Ab Negative Negative   Comment: This test was developed and its performance characteristics  determined by [facility]. It has not been cleared or  approved by the Food and Drug Administration.  Anti-Mi-2 Ab Negative Negative   Comment: This test was developed and its performance characteristics  determined by [facility]. It has not been cleared or  approved by the Food and Drug Administration.  Anti-TIF  1gamma Ab <20 Units <20   Comment: This test was developed and its performance characteristics  determined by [facility]. It has not been cleared or  approved by the Food and Drug  Administration.  Anti-MDA-5 Ab (CADM-140) <20 Units <20   Comment: This test was developed and its performance characteristics  determined by [facility]. It has not been cleared or  approved by the Food and Drug Administration.  Anti-NXP-2 (P140) Ab <20 Units <20   Comment: This test was developed and its performance characteristics  determined by [facility]. It has not been cleared or  approved by the Food and Drug Administration.  ANTI-PM/SCL-100 AB <20 Units <20   Comment: This test was developed and its performance characteristics  determined by [facility]. It has not been cleared or  approved by the Food and Drug Administration.  Anti-Ku Ab Negative Negative   Comment: This test was developed and its performance characteristics  determined by [facility]. It has not been cleared or  approved by the Food and Drug Administration.  Anti-SS-A 52kD Ab, IgG <20 Units <20   Comment: This test was developed and its performance characteristics  determined by [facility]. It has not been cleared or  approved by the Food and Drug Administration.  Anti-U1 RNP Ab <20 Units <20   Comment: This test was developed and its performance characteristics  determined by [facility]. It has not been cleared or  approved by the Food and Drug Administration.  Anti-U2 RNP Ab Negative Negative   Comment: This test was developed and its performance characteristics  determined by [facility]. It has not been cleared or  approved by the Food and Drug Administration.  Anti-U3 RNP (Fibrillarin) Negative Negative   Comment: This test was developed and its performance characteristics  determined by [facility]. It has not been cleared or  approved by the Food and Drug Administration.      Interpretation for Anti-Jo-1, Anti-TIF-1gamma,      Anti-MDA-5, Anti-NXP-2,  Anti-PM/Scl-100,      Anti-SS-A 52 kD, Anti-U1 RNP:          Negative:                                <20          Weak Positive:                       20 - 39          Moderate Positive:                   40 - 80          Strong Positive:                         >80                                                        .        ASSESSMENT/PLAN   Acute on chronic hypoxemic respiratory failure     - due to exacerbation of ILD - present on admission      - patient is improved s/p steroids and IVF      - patient reporting severe anxiety disorder   -autoimmune workup is negative from previous lab review     -IV solumedrol 40 bid >>40 daily      - Due to DM with hyperglycemia  have placed Diabetic coordinator consultation   Acute on chronic systolic CHF   - patient is currently without chest pain and is able to speak in full sentences    - caution with fluid balance    -EF has reduced substantially in past 1 year per cardiology - poor prognosis  Acute on chronic renal failure     - s/p diuresis      - dc non essential nephrotoxins    Moderate protein calorie malnutrition     - bitermporal wasting with hypolabuminemia      Thank you for allowing me to participate in the care of this patient.    Patient/Family are satisfied with care plan and all questions have been answered.  This document was prepared using Dragon voice recognition software and may include unintentional dictation errors.     Ottie Glazier, M.D.  Division of [place]

## 2021-06-14 ENCOUNTER — Encounter: Payer: Self-pay | Admitting: Internal Medicine

## 2021-06-14 DIAGNOSIS — I5020 Unspecified systolic (congestive) heart failure: Secondary | ICD-10-CM

## 2021-06-14 DIAGNOSIS — I471 Supraventricular tachycardia: Secondary | ICD-10-CM

## 2021-06-14 DIAGNOSIS — J9621 Acute and chronic respiratory failure with hypoxia: Secondary | ICD-10-CM | POA: Diagnosis not present

## 2021-06-14 DIAGNOSIS — Z515 Encounter for palliative care: Secondary | ICD-10-CM | POA: Diagnosis not present

## 2021-06-14 DIAGNOSIS — J849 Interstitial pulmonary disease, unspecified: Secondary | ICD-10-CM | POA: Diagnosis not present

## 2021-06-14 DIAGNOSIS — E1165 Type 2 diabetes mellitus with hyperglycemia: Secondary | ICD-10-CM | POA: Diagnosis not present

## 2021-06-14 DIAGNOSIS — Z7189 Other specified counseling: Secondary | ICD-10-CM | POA: Diagnosis not present

## 2021-06-14 DIAGNOSIS — I5043 Acute on chronic combined systolic (congestive) and diastolic (congestive) heart failure: Secondary | ICD-10-CM | POA: Diagnosis not present

## 2021-06-14 DIAGNOSIS — N1831 Chronic kidney disease, stage 3a: Secondary | ICD-10-CM | POA: Diagnosis not present

## 2021-06-14 LAB — COMPREHENSIVE METABOLIC PANEL
ALT: 181 U/L — ABNORMAL HIGH (ref 0–44)
AST: 219 U/L — ABNORMAL HIGH (ref 15–41)
Albumin: 2.9 g/dL — ABNORMAL LOW (ref 3.5–5.0)
Alkaline Phosphatase: 75 U/L (ref 38–126)
Anion gap: 10 (ref 5–15)
BUN: 42 mg/dL — ABNORMAL HIGH (ref 8–23)
CO2: 40 mmol/L — ABNORMAL HIGH (ref 22–32)
Calcium: 9.6 mg/dL (ref 8.9–10.3)
Chloride: 91 mmol/L — ABNORMAL LOW (ref 98–111)
Creatinine, Ser: 0.84 mg/dL (ref 0.44–1.00)
GFR, Estimated: >60 mL/min (ref >60)
Glucose, Bld: 161 mg/dL — ABNORMAL HIGH (ref 70–99)
Potassium: 4.3 mmol/L (ref 3.5–5.1)
Sodium: 141 mmol/L (ref 135–145)
Total Bilirubin: 0.6 mg/dL (ref 0.3–1.2)
Total Protein: 6 g/dL — ABNORMAL LOW (ref 6.5–8.1)

## 2021-06-14 LAB — HISTOPLASMA GAL'MANNAN AG SER: Histoplasma Gal'mannan Ag Ser: <0.5 (ref <0.5)

## 2021-06-14 LAB — GLUCOSE, CAPILLARY
Glucose-Capillary: 106 mg/dL — ABNORMAL HIGH (ref 70–99)
Glucose-Capillary: 161 mg/dL — ABNORMAL HIGH (ref 70–99)
Glucose-Capillary: 181 mg/dL — ABNORMAL HIGH (ref 70–99)
Glucose-Capillary: 181 mg/dL — ABNORMAL HIGH (ref 70–99)
Glucose-Capillary: 262 mg/dL — ABNORMAL HIGH (ref 70–99)
Glucose-Capillary: 270 mg/dL — ABNORMAL HIGH (ref 70–99)

## 2021-06-14 LAB — C-REACTIVE PROTEIN: CRP: 0.6 mg/dL (ref <1.0)

## 2021-06-14 MED ORDER — ADULT MULTIVITAMIN W/MINERALS CH
1.0000 | ORAL_TABLET | Freq: Every day | ORAL | Status: DC
Start: 2021-06-14 — End: 2021-06-17
  Administered 2021-06-14 – 2021-06-17 (×4): 1 via ORAL
  Filled 2021-06-14 (×5): qty 1

## 2021-06-14 MED ORDER — ENSURE MAX PROTEIN PO LIQD
11.0000 [oz_av] | Freq: Every day | ORAL | Status: DC
  Administered 2021-06-14 – 2021-06-16 (×3): 11 [oz_av] via ORAL
  Filled 2021-06-14: qty 330

## 2021-06-14 MED ORDER — GLUCERNA SHAKE PO LIQD
237.0000 mL | Freq: Two times a day (BID) | ORAL | Status: DC
  Administered 2021-06-15 – 2021-06-17 (×6): 237 mL via ORAL

## 2021-06-14 MED ORDER — FUROSEMIDE 20 MG PO TABS
20.0000 mg | ORAL_TABLET | Freq: Every day | ORAL | Status: DC
  Administered 2021-06-15 – 2021-06-17 (×3): 20 mg via ORAL
  Filled 2021-06-14 (×3): qty 1

## 2021-06-14 MED ORDER — INSULIN ASPART 100 UNIT/ML IJ SOLN
3.0000 [IU] | Freq: Three times a day (TID) | INTRAMUSCULAR | Status: DC
  Administered 2021-06-14 – 2021-06-17 (×9): 3 [IU] via SUBCUTANEOUS
  Filled 2021-06-14 (×9): qty 1

## 2021-06-14 NOTE — Progress Notes (Signed)
Occupational Therapy Treatment ?Patient Details ?Name: Sierra Guzman ?MRN: [id number] ?DOB: 1941-03-01 ?Today's Date: 06/14/2021 ? ? ?History of present illness Pt is an 81 y/o F admitted on 06/10/21 after presenting with c/c of SOB & noted to have low BP & tachycardia. Pt met sepsis criteria but this was later ruled out. Chest x-ray does show PNA. 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 PMH: anemia, chronic diastolic CHF, DM2, anxiety, asthma, CKD stage 3, lumbar DDD, CAD, gout, HLD, idiopathic pulmonary fibrosis, obesity, syncope ?  ?OT comments ? Pt seen for OT tx, received seated EOB with mobility tech and nurse tech and agreeable to OT session. Pt required increased time/effort/recovery with PRN VC for pursed lip breathing for SPT EOB>BSC>recliner. MIN A for pericare thoroughness provided by pt's dtr while pt performed lateral lean. SpO2 at 4L to start, bumped up briefly to 5L O2 with exertion and left on 4L O2. HR 120's to 150's with exertion. RN aware. Pt continues to benefit from skilled OT services.  ? ?Recommendations for follow up therapy are one component of a multi-disciplinary discharge planning process, led by the attending physician.  Recommendations may be updated based on patient status, additional functional criteria and insurance authorization. ?   ?Follow Up Recommendations ? Skilled nursing-short term rehab (<3 hours/day)  ?  ?Assistance Recommended at Discharge Intermittent Supervision/Assistance  ?Patient can return home with the following ? A little help with walking and/or transfers;A little help with bathing/dressing/bathroom;Assistance with cooking/housework;Assist for transportation;Help with stairs or ramp for entrance;Direct supervision/assist for medications management ?  ?Equipment Recommendations ? BSC/3in1  ?  ?Recommendations for Other Services   ? ?  ?Precautions / Restrictions  Precautions ?Precautions: Fall ?Precaution Comments: monitor HR! ?Restrictions ?Weight Bearing Restrictions: No  ? ? ?  ? ?Mobility Bed Mobility ?  ?  ?  ?  ?  ?  ?  ?General bed mobility comments: NT, seated EOB at start of session, up to recliner at end of session ?Patient Response: Anxious ? ?Transfers ?Overall transfer level: Needs assistance ?Equipment used: 1 person hand held assist ?Transfers: Sit to/from Stand, Bed to chair/wheelchair/BSC ?Sit to Stand: Min guard ?Stand pivot transfers: Min guard ?  ?  ?  ?  ?  ?  ?  ?Balance Overall balance assessment: Needs assistance ?Sitting-balance support: Feet supported, Bilateral upper extremity supported, No upper extremity supported, Single extremity supported ?Sitting balance-Leahy Scale: Fair ?  ?  ?Standing balance support: Single extremity supported, During functional activity ?Standing balance-Leahy Scale: Fair ?  ?  ?  ?  ?  ?  ?  ?  ?  ?  ?  ?  ?   ? ?ADL either performed or assessed with clinical judgement  ? ?ADL Overall ADL's : Needs assistance/impaired ?  ?  ?Grooming: Sitting;Set up ?  ?  ?  ?  ?  ?  ?  ?Lower Body Dressing: Sit to/from stand;Moderate assistance ?Lower Body Dressing Details (indicate cue type and reason): MOD A to support energy conservation/anxiety while pt stands to minimize length of time in standing ?Toilet Transfer: Min guard;BSC/3in1;Stand-pivot ?Toilet Transfer Details (indicate cue type and reason): handheld assist, VC for hand placement on BSC arm rest ?Toileting- Clothing Manipulation and Hygiene: Sitting/lateral lean;Minimal assistance ?Toileting - Clothing Manipulation Details (indicate cue type and reason): MIN A for thoroughness ?  ?  ?  ?  ?  ? ?Extremity/Trunk Assessment   ?  ?  ?  ?  ?  ? ?  Vision   ?  ?  ?Perception   ?  ?Praxis   ?  ? ?Cognition Arousal/Alertness: Awake/alert ?Behavior During Therapy: [facility] for tasks assessed/performed ?Overall Cognitive Status: Within Functional Limits for tasks assessed ?  ?  ?  ?  ?   ?  ?  ?  ?  ?  ?  ?  ?  ?  ?  ?  ?  ?  ?  ?   ?Exercises   ? ?  ?Shoulder Instructions   ? ? ?  ?General Comments HR 120's to 150's with SPT to [facility] and SPT to recliner. PLB utilized with VC as needed, Bumped briefly from 4 to 5L O2 with exertion to support recovery. Left on 4L  ? ? ?Pertinent Vitals/ Pain       Pain Assessment ?Pain Assessment: No/denies pain ? ?Home Living   ?  ?  ?  ?  ?  ?  ?  ?  ?  ?  ?  ?  ?  ?  ?  ?  ?  ?  ? ?  ?Prior Functioning/Environment    ?  ?  ?  ?   ? ?Frequency ? Min 2X/week  ? ? ? ? ?  ?Progress Toward Goals ? ?OT Goals(current goals can now be found in the care plan section) ? Progress towards OT goals: Progressing toward goals ? ?Acute Rehab OT Goals ?Patient Stated Goal: go home ?OT Goal Formulation: With patient ?Time For Goal Achievement: 06/27/21 ?Potential to Achieve Goals: Good  ?Plan Discharge plan remains appropriate;Frequency remains appropriate   ? ?Co-evaluation ? ? ?   ?  ?  ?  ?  ? ?  ?AM-PAC OT "6 Clicks" Daily Activity     ?Outcome Measure ? ? Help from another person eating meals?: None ?Help from another person taking care of personal grooming?: None ?Help from another person toileting, which includes using toliet, bedpan, or urinal?: A Little ?Help from another person bathing (including washing, rinsing, drying)?: A Little ?Help from another person to put on and taking off regular upper body clothing?: A Little ?Help from another person to put on and taking off regular lower body clothing?: A Little ?6 Click Score: 20 ? ?  ?End of Session Equipment Utilized During Treatment: Oxygen ? ?OT Visit Diagnosis: Other abnormalities of gait and mobility (R26.89);Muscle weakness (generalized) (M62.81) ?  ?Activity Tolerance Treatment limited secondary to medical complications (Comment) (elevated HR, anxious) ?  ?Patient Left   ?  ?Nurse Communication Other (comment) (HR, pt requesting anxiety medications) ?  ? ?   ? ?Time: 6940-9828 ?OT Time Calculation (min): 28  min ? ?Charges: OT General Charges ?$OT Visit: 1 Visit ?OT Treatments ?$Self Care/Home Management : 23-37 mins ? ?Ardeth Perfect., MPH, MS, OTR/L ?ascom [phone] ?06/14/21, 1:33 PM ? ?

## 2021-06-14 NOTE — Progress Notes (Signed)
Progress Note  Patient Name: Sierra Guzman Date of Encounter: 06/14/2021  [facility] Cardiologist: Janina Mayo, MD   Subjective   Remains euvolemic, labs stable. Patient reports breathing is about the same.   Inpatient Medications    Scheduled Meds:  azithromycin  250 mg Oral Daily   budesonide (PULMICORT) nebulizer solution  0.25 mg Nebulization BID   Chlorhexidine Gluconate Cloth  6 each Topical Daily   dextromethorphan-guaiFENesin  1 tablet Oral BID   dextrose  1 ampule Intravenous Once   digoxin  0.125 mg Oral Daily   empagliflozin  10 mg Oral Daily   enoxaparin (LOVENOX) injection  40 mg Subcutaneous Q24H   feeding supplement (GLUCERNA SHAKE)  237 mL Oral TID BM   insulin aspart  0-9 Units Subcutaneous TID WC   ipratropium-albuterol  3 mL Nebulization TID   lidocaine  1 patch Transdermal Q24H   mouth rinse  15 mL Mouth Rinse BID   methylPREDNISolone (SOLU-MEDROL) injection  40 mg Intravenous Q24H   metoprolol succinate  12.5 mg Oral Daily   pantoprazole (PROTONIX) IV  40 mg Intravenous Q24H   sodium chloride flush  3 mL Intravenous Q12H   Continuous Infusions:  sodium chloride     sodium chloride     PRN Meds: sodium chloride, acetaminophen **OR** acetaminophen, ALPRAZolam, dextrose, hydrALAZINE, ipratropium-albuterol, metoprolol tartrate, sodium chloride flush   Vital Signs    Vitals:   06/14/21 0323 06/14/21 0400 06/14/21 0741 06/14/21 1157  BP: 104/64  104/65 100/71  Pulse: 82  (!) 57 97  Resp: 20  20 18   Temp: 98.2 F (36.8 C)  97.7 F (36.5 C) 98 F (36.7 C)  TempSrc: Oral     SpO2: 93%  96% 95%  Weight:  62.9 kg    Height:        Intake/Output Summary (Last 24 hours) at 06/14/2021 1230 Last data filed at 06/14/2021 0700 Gross per 24 hour  Intake 480 ml  Output 1900 ml  Net -1420 ml   Last 3 Weights 06/14/2021 06/13/2021 06/12/2021  Weight (lbs) 138 lb 9.7 oz 138 lb 10.7 oz 139 lb 5.3 oz  Weight (kg) 62.872 kg 62.9 kg 63.2 kg      Telemetry     SR, HR around 100 - Personally Reviewed  ECG    No new - Personally Reviewed  Physical Exam   GEN: No acute distress.   Neck: No JVD Cardiac: RRR, no murmurs, rubs, or gallops.  Respiratory: diminished sounds, mild crackles. GI: Soft, nontender, non-distended  MS: No edema; No deformity. Neuro:  Nonfocal  Psych: Normal affect   Labs    High Sensitivity Troponin:   Recent Labs  Lab 06/10/21 2011 06/10/21 2222  TROPONINIHS 67* 61*     Chemistry Recent Labs  Lab 06/10/21 2011 06/11/21 0509 06/11/21 1210 06/12/21 0524 06/13/21 0959 06/14/21 0558  NA  --  144   < > 140 139 141  K  --  3.9   < > 4.1 4.1 4.3  CL  --  104   < > 99 92* 91*  CO2  --  33*   < > 33* 36* 40*  GLUCOSE  --  138*   < > 117* 264* 161*  BUN  --  23   < > 29* 33* 42*  CREATININE 0.91 0.81   < > 1.03* 1.00 0.84  CALCIUM  --  9.4   < > 9.5 9.5 9.6  MG 1.6* 3.7*  --   --   --   --  PROT  --  6.4*  --  6.3*  --  6.0*  ALBUMIN  --  3.1*  --  3.0*  --  2.9*  AST  --  94*  --  155*  --  219*  ALT  --  74*  --  91*  --  181*  ALKPHOS  --  57  --  57  --  75  BILITOT  --  0.7  --  0.4  --  0.6  GFRNONAA >60 >60   < > 55* 57* >60  ANIONGAP  --  7   < > 8 11 10    < > = values in this interval not displayed.    Lipids No results for input(s): CHOL, TRIG, HDL, LABVLDL, LDLCALC, CHOLHDL in the last 168 hours.  Hematology Recent Labs  Lab 06/10/21 2011 06/11/21 0509 06/12/21 0524 06/13/21 0959  WBC 11.0* 12.0* 13.8*  --   RBC 2.89* 3.06* 2.94*  --   HGB 9.1* 9.5* 9.2* 10.4*  HCT 30.0* 31.1* 29.6*  --   MCV 103.8* 101.6* 100.7*  --   MCH 31.5 31.0 31.3  --   MCHC 30.3 30.5 31.1  --   RDW 14.5 14.3 14.1  --   PLT 211 237 224  --    Thyroid  Recent Labs  Lab 06/11/21 0509  TSH 1.020    BNP Recent Labs  Lab 06/10/21 2011 06/11/21 0509  BNP 563.3* 662.9*    DDimer No results for input(s): DDIMER in the last 168 hours.   Radiology    No results found.  Cardiac Studies   Echo  06/11/21  1. Left ventricular ejection fraction, by estimation, is 30 to 35%. The  left ventricle has moderately decreased function. The left ventricle  demonstrates global hypokinesis. The left ventricular internal cavity size  was mildly dilated. Left ventricular  diastolic parameters are consistent with Grade I diastolic dysfunction  (impaired relaxation).   2. Right ventricular systolic function is mildly reduced. The right  ventricular size is mildly enlarged. Mildly increased right ventricular  wall thickness. There is moderately elevated pulmonary artery systolic  pressure. The estimated right ventricular  systolic pressure is 123456 mmHg.   3. The mitral valve is normal in structure. Mild mitral valve  regurgitation. No evidence of mitral stenosis.   4. Tricuspid valve regurgitation is moderate.   5. The aortic valve is normal in structure. Aortic valve regurgitation is  not visualized. Aortic valve sclerosis/calcification is present, without  any evidence of aortic stenosis.   6. Compared to prior study dated 12/09/2020, EF has decreased  significantly.      Nuclear stress test 05/2019  Abnormal nuclear stress test. Moderate left ventricular  systolic function, LVEF [id number] no focal wall motion abnormalities. It is  worth noting that, in [id number] systolic function was normal by echo, so this  may be a poor assessment of EF.  No ischemia or infarct. Compared with prior study (nuclear stress test  [id number]), systolic function is significantly reduced (from normal), but  perfusion remains normal. Electronically Signed by:   Lenna Sciara. Newman Pies, MD [facility]  6:01 PM on 06/01/2021    Echo 01/13/21 1. Left ventricular ejection fraction, by estimation, is 45 to 50%. Left  ventricular ejection fraction by 3D volume is 45 %. The left ventricle has  mildly decreased function. The left ventricle has no regional wall motion  abnormalities. Left ventricular   diastolic parameters are consistent with  Grade I diastolic dysfunction  (  impaired relaxation).   2. Right ventricular systolic function is moderately reduced. The right  ventricular size is mildly enlarged. There is normal pulmonary artery  systolic pressure. The estimated right ventricular systolic pressure is  [id number] mmHg.   3. The mitral valve is grossly normal. Trivial mitral valve  regurgitation.   4. The aortic valve is calcified. There is severe calcifcation of the  aortic valve. There is moderate thickening of the aortic valve. Aortic  valve regurgitation is mild. Mild aortic valve stenosis.    Echo 12/09/20 1. Left ventricular ejection fraction, by estimation, is 60 to 65%. The  left ventricle has normal function. The left ventricle has no regional  wall motion abnormalities. Left ventricular diastolic parameters are  consistent with Grade I diastolic  dysfunction (impaired relaxation). The average left ventricular global  longitudinal strain is -16.6 %. The global longitudinal strain is normal.   2. Right ventricular systolic function is normal. The right ventricular  size is normal.   3. The aortic valve is normal in structure. Aortic valve regurgitation is  not visualized. Mild to moderate aortic valve sclerosis/calcification is  present, without any evidence of aortic stenosis.   Patient Profile     81 y.o. female with a hx of chronic respiratory failure on home O2, 2 to 6 L, chronic diastolic heart failure, CAD, diabetes type 2, hypertension, PVCs, multifocal atrial tachycardia, OA, obesity, asthma, CKD stage III, progressive pulmonary fibrosis who is being seen 06/13/2021 for the evaluation of low EF.  Assessment & Plan    Cardiomyopathy EF 30-35% Acute on chronic HFrEF - progressively worsening EF since 12/2020 at which time EF was normal.  - echo 01/2021 showed EF 45-50% at which time family opted for medical management. OP nuclear stress 05/2020 showed EF 33% with no ischemia - Echo this admission with LVEF  30-35% - In 2006 cath showed ith 50% LAD and 50% D1 stenosis  - no chest pain reported - Torsemide 20mg  BID - Toprol XL 12.5mg  BID - started on Digoxin 0.125mg  daily  - GDMT as able, this is limited by low BP - After discussion with family, no plan for invasive work-up. Continue medical management.    Acute respiratory failure ILD exacerbation on  chronic supplemental O2 Sepsis 2/2 possible HCAP - s/p IVF and steroids - IV abx -per CCM   Sinus tachycardia H/o Multifocal atrial tachycardia - rates better - PTA toprol 12.5mg  BID  GOC - palliative to see  For questions or updates, please contact [facility] Please consult [email or web address] for contact info under        Signed, Hersh Minney Ninfa Meeker, PA-C  06/14/2021, 12:30 PM

## 2021-06-14 NOTE — Progress Notes (Signed)
Inpatient Diabetes Program Recommendations ? ?AACE/ADA: New Consensus Statement on Inpatient Glycemic Control (2015) ? ?Target Ranges:  Prepandial:   less than 140 mg/dL ?     Peak postprandial:   less than 180 mg/dL (1-2 hours) ?     Critically ill patients:  140 - 180 mg/dL  ? ? Latest Reference Range & Units 06/13/21 08:07 06/13/21 12:26 06/13/21 16:34 06/13/21 19:53 06/13/21 23:29  ?Glucose-Capillary 70 - 99 mg/dL 786 (H) ? ?3 units Novolog ? 228 (H) ? ?7 units Novolog ? 249 (H) ? ?7 units Novolog ? 138 (H) 59 (L)  ? ? Latest Reference Range & Units 06/14/21 07:43 06/14/21 12:00  ?Glucose-Capillary 70 - 99 mg/dL 767 (H) ? ?2 units Novolog ? 270 (H)  ?(H): Data is abnormally high ? ? ? ? ?Home DM Meds: Metformin 850 mg BID  ? ?Current Orders: Novolog 0-9 units TID  ?Jardiance 10 mg daily ? ? ? ? ?Reduced Novolog SSI and started Jardiance yest at 4pm.   ? ?Reduced Solumedrol to 40 mg daily yest.   ? ?Had HYPO yest at 11:30pm (CBG 59)--likely from the 7 units Novolog given at 5pm. ? ? ?MD- Note elevation at 12pm today.  Note pt eating solid PO diet and getting Glucerna PO supps TID between meals ? ?Please consider starting low dose Novolog Meal Coverage:  ?Novolog 3 units TID with meals ?HOLD if pt eats <50% of meals ? ? ? ?--Will follow patient during hospitalization-- ? ?Ambrose Finland RN, MSN, CDE ?Diabetes Coordinator ?Inpatient Glycemic Control Team ?Team Pager: [phone] (8a-5p) ? ?  ? ? ? ?

## 2021-06-14 NOTE — Progress Notes (Signed)
PULMONOLOGY         Date: 06/14/2021,   MRN# [id number] Sierra Guzman February 09, 1941     AdmissionWeight: 63.9 kg                 CurrentWeight: 62.9 kg   Referring physician: Dr Leslye Peer   CHIEF COMPLAINT:   Acute on chronic hypoxemic respiratory failure   HISTORY OF PRESENT ILLNESS   This 81 year old female with a history of interstitial lung disease and chronic hypoxemia on supplemental O2 at with recurrent exacerbations, CHF with reduced LVEF at 45%, chronic diabetes mellitus.  She was evaluated by me in the office and at that time was in mild to moderate respiratory distress and acute on chronic hypoxemia but adamantly declined being hospitalized at that time.  During outpatient visit she did receive intramuscular 20 mg Solu-Medrol as well as increased supplemental oxygen and half liter of resuscitative fluids due to clinical findings of dehydration including skin tenting on all 4 extremities with poor urine output per family.  After this outpatient treatment she improved substantially and actually was able to take a nap in our clinic for over 2 hours while the nurse watched over her.  This improvement however was very transient and short-lived and patient again became worse and finally did come to the ER.  She received nonrebreather in the emergency room as well as resuscitative fluids and stated she improved.  Family reports severe anxiety disorder with recurrent panic attacks.  Patient also received 2 L of resuscitative fluids on admission as well as empiric antibiotics with vancomycin, cefepime, azithromycin.  She was admitted to the medical floor and pulmonary consultation was placed due to complex pulmonary history with interstitial lung disease, chronic hypoxemia and recurrent exacerbations of underlying ILD.   06/13/21- patient is slightly improved s/p >6L urine output with diuresis.  She is nonlabored nontachypneic during my evaluation.  She had cardiology evaluation today.   Myself and Dr Fletcher Anon spoke with Sierra Guzman on phone regarding palliative care evaluation   06/14/21- patient with no acute events overnight. She appears more comfortable.  I spoke with daughter Sierra Guzman and reviewed medical plan. She has palliative care evaluation.     PAST MEDICAL HISTORY   Past Medical History:  Diagnosis Date   Anemia 10/28/2010   unspecified   Anxiety    Aortic valve sclerosis    Arthritis    Asthma    Chronic combined systolic and diastolic CHF (congestive heart failure) ([place])    echo- 10/22 LVEF 45-55%, G1DD, moderately reduced RV fxn, mild aortic regurg/stenosis   Chronic kidney disease    Chronic Kidney Disease---Stage III   Coronary artery disease 10/28/2010   DDD (degenerative disc disease), lumbar 2006   Diabetes type 2, controlled ([place]) 10/28/2010   Dyspnea    GERD (gastroesophageal reflux disease)    Gout 10/28/2010   Heart murmur    History of cardiac catheterization 2006   LAD 50%, D1 50% rest normal.    History of cardiovascular stress test 09/23/2009    lexiscan Normal Cardiolite test, EF 61%. LV size and function - normal   History of echocardiogram 05/27/2008   EF 55%; Mild aortic insufficiency with minimal aortic sclerosis. L vent normal.    Hyperlipidemia    Hyperlipidemia LDL goal <100 10/28/2010   managed by Dr. Dorena Bodo   Hypertension 10/28/2010   IPF (idiopathic pulmonary fibrosis) ([place])    Progressive, with UIP   Mild vitamin D deficiency 02/22/2011   Obesity (  BMI 30-39.9) 10/28/2010   unspecified   Osteoarthritis 10/28/2010   bilateral knees, left hip   Personal history of colonic polyps 10/28/2010   Shortness of breath    Syncope 2013   Vitamin B 12 deficiency 10/28/2010     SURGICAL HISTORY   Past Surgical History:  Procedure Laterality Date   BACK SURGERY  2006   DDD   BREAST BIOPSY Right    neg   CARDIAC CATHETERIZATION  2006   EYE SURGERY Bilateral    Cataract Extraction with IOL   JOINT REPLACEMENT Left    Left  Total Hip Replacement   JOINT REPLACEMENT     RIGHT 2005; LEFT 2 X 2004, 2011   REPLACEMENT TOTAL KNEE Left    REVISION TOTAL KNEE ARTHROPLASTY Left    Dr. Nome Right 12/12/2016   Procedure: TOTAL HIP ARTHROPLASTY ANTERIOR APPROACH;  Surgeon: Hessie Knows, MD;  Location: [facility];  Service: Orthopedics;  Laterality: Right;   TOTAL HIP ARTHROPLASTY Left 12/26/2013   Procedure: ARTHROPLASTY HIP TOTAL; Surgeon: Zorita Pang, MD; Location: [facility]; Service: Orthopedics; Laterality: left,    TOTAL KNEE ARTHROPLASTY     both knees     FAMILY HISTORY   Family History  Problem Relation Age of Onset   Breast cancer Mother 75   Diabetes Mother    Stroke Mother    Stroke Brother    Breast cancer Cousin        pat cousin     SOCIAL HISTORY   Social History   Tobacco Use   Smoking status: Former    Packs/day: 0.25    Years: 10.00    Pack years: 2.50    Types: Cigarettes    Quit date: 08/18/1990    Years since quitting: 30.8   Smokeless tobacco: Never  Vaping Use   Vaping Use: Never used  Substance Use Topics   Alcohol use: No   Drug use: No     MEDICATIONS    Home Medication:    Current Medication:  Current Facility-Administered Medications:    0.9 %  sodium chloride infusion, 250 mL, Intravenous, PRN, Posey Pronto, Ekta V, MD   0.9 %  sodium chloride infusion, 250 mL, Intravenous, Continuous, Rust-Chester, Toribio Harbour L, NP   acetaminophen (TYLENOL) tablet 650 mg, 650 mg, Oral, Q6H PRN, 650 mg at 06/11/21 2206 **OR** acetaminophen (TYLENOL) suppository 650 mg, 650 mg, Rectal, Q6H PRN, Para Skeans, MD   ALPRAZolam Duanne Moron) tablet 0.25 mg, 0.25 mg, Oral, TID PRN, Loletha Grayer, MD, 0.25 mg at 06/14/21 0519   azithromycin (ZITHROMAX) tablet 250 mg, 250 mg, Oral, Daily, Wieting, Richard, MD, 250 mg at 06/13/21 1706   budesonide (PULMICORT) nebulizer solution 0.25 mg, 0.25 mg, Nebulization, BID, Rust-Chester, Britton L, NP,  0.25 mg at 06/14/21 0801   Chlorhexidine Gluconate Cloth 2 % PADS 6 each, 6 each, Topical, Daily, Rust-Chester, Britton L, NP, 6 each at 06/14/21 0920   dextromethorphan-guaiFENesin (Tyrone DM) 30-600 MG per 12 hr tablet 1 tablet, 1 tablet, Oral, BID, Rust-Chester, Britton L, NP, 1 tablet at 06/14/21 0920   dextrose 50 % solution 50 mL, 1 ampule, Intravenous, Once, Smith, Rondell A, MD   dextrose 50 % solution 50 mL, 1 ampule, Intravenous, PRN, Tamala Julian, Rondell A, MD   digoxin (LANOXIN) tablet 0.125 mg, 0.125 mg, Oral, Daily, Arida, Muhammad A, MD, 0.125 mg at 06/14/21 0918   empagliflozin (JARDIANCE) tablet 10 mg, 10 mg, Oral, Daily,  Loletha Grayer, MD, 10 mg at 06/14/21 0918   enoxaparin (LOVENOX) injection 40 mg, 40 mg, Subcutaneous, Q24H, Wynelle Cleveland, RPH, 40 mg at 06/13/21 2229   [START ON 06/15/2021] feeding supplement (GLUCERNA SHAKE) (GLUCERNA SHAKE) liquid 237 mL, 237 mL, Oral, BID BM, Loletha Grayer, MD   Derrill Memo ON 06/15/2021] furosemide (LASIX) tablet 20 mg, 20 mg, Oral, Daily, Wieting, Richard, MD   hydrALAZINE (APRESOLINE) injection 5 mg, 5 mg, Intravenous, Q4H PRN, Posey Pronto, Gretta Cool, MD   insulin aspart (novoLOG) injection 0-9 Units, 0-9 Units, Subcutaneous, TID WC, Smith, Rondell A, MD, 5 Units at 06/14/21 1255   insulin aspart (novoLOG) injection 3 Units, 3 Units, Subcutaneous, TID WC, Wieting, Richard, MD   ipratropium-albuterol (DUONEB) 0.5-2.5 (3) MG/3ML nebulizer solution 3 mL, 3 mL, Nebulization, TID, Schertz, Michele Mcalpine, MD, 3 mL at 06/14/21 1353   ipratropium-albuterol (DUONEB) 0.5-2.5 (3) MG/3ML nebulizer solution 3 mL, 3 mL, Nebulization, Q4H PRN, Schertz, Michele Mcalpine, MD   lidocaine (LIDODERM) 5 % 1 patch, 1 patch, Transdermal, Q24H, Wieting, Richard, MD, 1 patch at 06/14/21 1255   MEDLINE mouth rinse, 15 mL, Mouth Rinse, BID, Rust-Chester, Britton L, NP, 15 mL at 06/14/21 0920   methylPREDNISolone sodium succinate (SOLU-MEDROL) 40 mg/mL injection 40 mg, 40 mg, Intravenous,  Q24H, Janmichael Giraud, MD, 40 mg at 06/14/21 [id number]   metoprolol succinate (TOPROL-XL) 24 hr tablet 12.5 mg, 12.5 mg, Oral, Daily, Leslye Peer, Richard, MD, 12.5 mg at 06/14/21 [id number]   metoprolol tartrate (LOPRESSOR) injection 5 mg, 5 mg, Intravenous, PRN, Benita Gutter, RPH   multivitamin with minerals tablet 1 tablet, 1 tablet, Oral, Daily, Wieting, Richard, MD   pantoprazole (PROTONIX) injection 40 mg, 40 mg, Intravenous, Q24H, Rust-Chester, Britton L, NP, 40 mg at 06/13/21 2228   protein supplement (ENSURE MAX) liquid, 11 oz, Oral, QHS, Wieting, Richard, MD   sodium chloride flush (NS) 0.9 % injection 3 mL, 3 mL, Intravenous, Q12H, Florina Ou V, MD, 3 mL at 06/14/21 [id number]   sodium chloride flush (NS) 0.9 % injection 3 mL, 3 mL, Intravenous, PRN, Para Skeans, MD    ALLERGIES   Morphine and related     REVIEW OF SYSTEMS    Review of Systems:  Gen:  Denies  fever, sweats, chills weigh loss  HEENT: Denies blurred vision, double vision, ear pain, eye pain, hearing loss, nose bleeds, sore throat Cardiac:  No dizziness, chest pain or heaviness, chest tightness,edema Resp:   Denies cough or sputum porduction, shortness of breath,wheezing, hemoptysis,  Gi: Denies swallowing difficulty, stomach pain, nausea or vomiting, diarrhea, constipation, bowel incontinence Gu:  Denies bladder incontinence, burning urine Ext:   Denies Joint pain, stiffness or swelling Skin: Denies  skin rash, easy bruising or bleeding or hives Endoc:  Denies polyuria, polydipsia , polyphagia or weight change Psych:   Denies depression, insomnia or hallucinations   Other:  All other systems negative   VS: BP 126/74 (BP Location: Left Arm)    Pulse 69    Temp 97.9 F (36.6 C)    Resp 16    Ht 5\' 5"  (1.651 m)    Wt 62.9 kg    SpO2 100%    BMI 23.07 kg/m      PHYSICAL EXAM    GENERAL:NAD, no fevers, chills, no weakness no fatigue HEAD: Normocephalic, atraumatic.  EYES: Pupils equal, round, reactive to light.  Extraocular muscles intact. No scleral icterus.  MOUTH: Moist mucosal membrane. Dentition intact. No abscess noted.  EAR, NOSE, THROAT: Clear without  exudates. No external lesions.  NECK: Supple. No thyromegaly. No nodules. No JVD.  PULMONARY: crackles at bases bilaterally  CARDIOVASCULAR: S1 and S2. Regular rate and rhythm. No murmurs, rubs, or gallops. No edema. Pedal pulses 2+ bilaterally.  GASTROINTESTINAL: Soft, nontender, nondistended. No masses. Positive bowel sounds. No hepatosplenomegaly.  MUSCULOSKELETAL: No swelling, clubbing, or edema. Range of motion full in all extremities.  NEUROLOGIC: Cranial nerves II through XII are intact. No gross focal neurological deficits. Sensation intact. Reflexes intact.  SKIN: No ulceration, lesions, rashes, or cyanosis. Skin warm and dry. Turgor intact.  PSYCHIATRIC: Mood, affect within normal limits. The patient is awake, alert and oriented x 3. Insight, judgment intact.       IMAGING    CT Angio Chest PE W and/or Wo Contrast  Result Date: 06/10/2021 CLINICAL DATA:  Increasing shortness of breath, increasing oxygen requirement, history of pulmonary fibrosis EXAM: CT ANGIOGRAPHY CHEST WITH CONTRAST TECHNIQUE: Multidetector CT imaging of the chest was performed using the standard protocol during bolus administration of intravenous contrast. Multiplanar CT image reconstructions and MIPs were obtained to evaluate the vascular anatomy. RADIATION DOSE REDUCTION: This exam was performed according to the departmental dose-optimization program which includes automated exposure control, adjustment of the mA and/or kV according to patient size and/or use of iterative reconstruction technique. CONTRAST:  75mL OMNIPAQUE IOHEXOL 350 MG/ML SOLN COMPARISON:  06/10/2021, 01/12/2021 FINDINGS: Cardiovascular: This is a technically adequate evaluation of the pulmonary vasculature. No filling defects or pulmonary emboli. Stable cardiomegaly without pericardial effusion.  Normal caliber of the thoracic aorta. Stable atherosclerosis of the aortic arch and coronary vasculature. Mediastinum/Nodes: Decreased mediastinal adenopathy, measuring up to 1.9 cm in the subcarinal station and 1.4 cm in the precarinal region. Thyroid, trachea, and esophagus are unremarkable. Moderate hiatal hernia. Lungs/Pleura: Basilar predominant scarring and fibrosis again noted, with continued bibasilar bronchiectasis. There are areas of increased interlobular septal thickening and ground-glass airspace disease within the mid and upper lung zones, and superimposed edema cannot be excluded. No effusion or pneumothorax. Central airways are patent. Upper Abdomen: No acute abnormality. Musculoskeletal: No acute or destructive bony lesions. Reconstructed images demonstrate no additional findings. Review of the MIP images confirms the above findings. IMPRESSION: 1. No evidence of pulmonary embolus. 2. 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 3. Increased interlobular septal thickening and ground-glass opacities within the mid and upper lung zones, which could reflect superimposed edema. 4.  Aortic Atherosclerosis (ICD10-I70.0). Electronically Signed   By: Sharlet SalinaMichael  Brown M.D.   On: 06/10/2021 19:14   DG Chest Port 1 View  Result Date: 06/10/2021 CLINICAL DATA:  Questionable sepsis, evaluate for abnormality. Increasing shortness of breath and oxygen requirement. History of pulmonary fibrosis. EXAM: PORTABLE CHEST 1 VIEW COMPARISON:  Most recent radiograph 05/09/2021, chest CT 01/12/2021 FINDINGS: Low lung volumes persist. Sequela of chronic interstitial lung disease with reticulation and basilar honeycombing. This is stable in appearance from prior exam. Similar cardiomegaly with unchanged mediastinal contours. No superimposed airspace disease, large pleural effusion or pneumothorax. Grossly stable osseous structures. IMPRESSION: Chronic interstitial lung disease, similar in  appearance to prior. No evidence of superimposed acute process. Electronically Signed   By: Narda RutherfordMelanie  Sanford M.D.   On: 06/10/2021 17:12   ECHOCARDIOGRAM COMPLETE  Result Date: 06/11/2021    ECHOCARDIOGRAM REPORT   Patient Name:   Sierra Guzman Date of Exam: 06/11/2021 Medical Rec #:  [id number]    Height:       65.0 in Accession #:    [id number]  Weight:       139.8 lb Date of Birth:  12/09/1940    BSA:          1.699 m Patient Age:    12 years     BP:           89/57 mmHg Patient Gender: F            HR:           106 bpm. Exam Location:  [facility] Procedure: 2D Echo, Cardiac Doppler and Color Doppler Indications:     CHF-Acute Systolic [id number] / [id number]  History:         Patient has prior history of Echocardiogram examinations. CAD;                  Signs/Symptoms:Shortness of Breath and Syncope.  Sonographer:     Alyse Low Roar Referring Phys:  [place] Diagnosing Phys: Ida Rogue MD IMPRESSIONS  1. Left ventricular ejection fraction, by estimation, is 30 to 35%. The left ventricle has moderately decreased function. The left ventricle demonstrates global hypokinesis. The left ventricular internal cavity size was mildly dilated. Left ventricular diastolic parameters are consistent with Grade I diastolic dysfunction (impaired relaxation).  2. Right ventricular systolic function is mildly reduced. The right ventricular size is mildly enlarged. Mildly increased right ventricular wall thickness. There is moderately elevated pulmonary artery systolic pressure. The estimated right ventricular systolic pressure is 123456 mmHg.  3. The mitral valve is normal in structure. Mild mitral valve regurgitation. No evidence of mitral stenosis.  4. Tricuspid valve regurgitation is moderate.  5. The aortic valve is normal in structure. Aortic valve regurgitation is not visualized. Aortic valve sclerosis/calcification is present, without any evidence of aortic stenosis.  6. Compared to prior study dated 12/09/2020, EF has  decreased significantly. FINDINGS  Left Ventricle: Left ventricular ejection fraction, by estimation, is 30 to 35%. The left ventricle has moderately decreased function. The left ventricle demonstrates global hypokinesis. The left ventricular internal cavity size was mildly dilated. There is no left ventricular hypertrophy. Left ventricular diastolic parameters are consistent with Grade I diastolic dysfunction (impaired relaxation). Right Ventricle: The right ventricular size is mildly enlarged. Mildly increased right ventricular wall thickness. Right ventricular systolic function is mildly reduced. There is moderately elevated pulmonary artery systolic pressure. The tricuspid regurgitant velocity is 3.18 m/s, and with an assumed right atrial pressure of 5 mmHg, the estimated right ventricular systolic pressure is 123456 mmHg. Left Atrium: Left atrial size was normal in size. Right Atrium: Right atrial size was normal in size. Pericardium: There is no evidence of pericardial effusion. Mitral Valve: The mitral valve is normal in structure. Mild mitral valve regurgitation. No evidence of mitral valve stenosis. Tricuspid Valve: The tricuspid valve is normal in structure. Tricuspid valve regurgitation is moderate . No evidence of tricuspid stenosis. Aortic Valve: The aortic valve is normal in structure. Aortic valve regurgitation is not visualized. Aortic valve sclerosis/calcification is present, without any evidence of aortic stenosis. Aortic valve peak gradient measures 12.7 mmHg. Pulmonic Valve: The pulmonic valve was normal in structure. Pulmonic valve regurgitation is mild. No evidence of pulmonic stenosis. Aorta: The aortic root is normal in size and structure. IAS/Shunts: No atrial level shunt detected by color flow Doppler.  LEFT VENTRICLE PLAX 2D LVIDd:         4.63 cm      Diastology LVIDs:         3.91 cm      LV  e' medial:    4.24 cm/s LV PW:         0.84 cm      LV E/e' medial:  13.6 LV IVS:        0.95 cm       LV e' lateral:   7.29 cm/s LVOT diam:     2.10 cm      LV E/e' lateral: 7.9 LVOT Area:     3.46 cm  LV Volumes (MOD) LV vol d, MOD A2C: 104.0 ml LV vol d, MOD A4C: 104.0 ml LV vol s, MOD A2C: 64.3 ml LV vol s, MOD A4C: 68.3 ml LV SV MOD A2C:     39.7 ml LV SV MOD A4C:     104.0 ml LV SV MOD BP:      37.2 ml RIGHT VENTRICLE RV Basal diam:  3.24 cm RV Mid diam:    2.59 cm RV S prime:     13.90 cm/s TAPSE (M-mode): 1.9 cm LEFT ATRIUM             Index        RIGHT ATRIUM           Index LA diam:        4.50 cm 2.65 cm/m   RA Area:     14.90 cm LA Vol (A2C):   56.2 ml 33.08 ml/m  RA Volume:   37.60 ml  22.13 ml/m LA Vol (A4C):   51.1 ml 30.08 ml/m LA Biplane Vol: 56.4 ml 33.20 ml/m  AORTIC VALVE                 PULMONIC VALVE AV Area (Vmax): 1.48 cm     PV Vmax:          0.91 m/s AV Vmax:        178.00 cm/s  PV Peak grad:     3.3 mmHg AV Peak Grad:   12.7 mmHg    PR End Diast Vel: 15.84 msec LVOT Vmax:      75.90 cm/s   RVOT Peak grad:   1 mmHg  AORTA Ao Root diam: 2.50 cm Ao Asc diam:  2.60 cm MITRAL VALVE               TRICUSPID VALVE MV Area (PHT): 5.50 cm    TR Peak grad:   40.4 mmHg MV Decel Time: 138 msec    TR Vmax:        318.00 cm/s MV E velocity: 57.50 cm/s MV A velocity: 93.00 cm/s  SHUNTS MV E/A ratio:  0.62        Systemic Diam: 2.10 cm MV A Prime:    11.6 cm/s Ida Rogue MD Electronically signed by Ida Rogue MD Signature Date/Time: 06/11/2021/4:42:08 PM    Final     Ref Range & Units 1 yr ago  Anti-JO 1 Ab <20 Units <20   Anti-PL-7 Ab Negative Negative   Comment: This test was developed and its performance characteristics  determined by [facility]. It has not been cleared or  approved by the Food and Drug Administration.  Anti-PL-12 Ab Negative Negative   Comment: This test was developed and its performance characteristics  determined by [facility]. It has not been cleared or  approved by the Food and Drug Administration.  Anti-EJ Ab Negative Negative   Comment: This test was  developed and its performance characteristics  determined by [facility]. It has not been cleared or  approved by the Food and Drug  Administration.  Anti-OJ Ab Negative Negative   Comment: This test was developed and its performance characteristics  determined by [facility]. It has not been cleared or  approved by the Food and Drug Administration.  Anti-SRP Ab Negative Negative   Comment: This test was developed and its performance characteristics  determined by [facility]. It has not been cleared or  approved by the Food and Drug Administration.  Anti-Mi-2 Ab Negative Negative   Comment: This test was developed and its performance characteristics  determined by [facility]. It has not been cleared or  approved by the Food and Drug Administration.  Anti-TIF 1gamma Ab <20 Units <20   Comment: This test was developed and its performance characteristics  determined by [facility]. It has not been cleared or  approved by the Food and Drug Administration.  Anti-MDA-5 Ab (CADM-140) <20 Units <20   Comment: This test was developed and its performance characteristics  determined by [facility]. It has not been cleared or  approved by the Food and Drug Administration.  Anti-NXP-2 (P140) Ab <20 Units <20   Comment: This test was developed and its performance characteristics  determined by [facility]. It has not been cleared or  approved by the Food and Drug Administration.  ANTI-PM/SCL-100 AB <20 Units <20   Comment: This test was developed and its performance characteristics  determined by [facility]. It has not been cleared or  approved by the Food and Drug Administration.  Anti-Ku Ab Negative Negative   Comment: This test was developed and its performance characteristics  determined by [facility]. It has not been cleared or  approved by the Food and Drug Administration.  Anti-SS-A 52kD Ab, IgG <20 Units <20   Comment: This test was developed and its performance characteristics  determined by [facility]. It has not been  cleared or  approved by the Food and Drug Administration.  Anti-U1 RNP Ab <20 Units <20   Comment: This test was developed and its performance characteristics  determined by [facility]. It has not been cleared or  approved by the Food and Drug Administration.  Anti-U2 RNP Ab Negative Negative   Comment: This test was developed and its performance characteristics  determined by [facility]. It has not been cleared or  approved by the Food and Drug Administration.  Anti-U3 RNP (Fibrillarin) Negative Negative   Comment: This test was developed and its performance characteristics  determined by [facility]. It has not been cleared or  approved by the Food and Drug Administration.      Interpretation for Anti-Jo-1, Anti-TIF-1gamma,      Anti-MDA-5, Anti-NXP-2, Anti-PM/Scl-100,      Anti-SS-A 52 kD, Anti-U1 RNP:          Negative:                                <20          Weak Positive:                       20 - 39          Moderate Positive:                   40 - 80          Strong Positive:                         >80                                                        .  ASSESSMENT/PLAN   Acute on chronic hypoxemic respiratory failure     - due to exacerbation of ILD - present on admission      - patient is improved s/p steroids and IVF      - patient reporting severe anxiety disorder   -autoimmune workup is negative from previous lab review     -IV solumedrol 40 bid >>40 daily      - Due to DM with hyperglycemia have placed Diabetic coordinator consultation   Acute on chronic systolic CHF   - patient is currently without chest pain and is able to speak in full sentences    - caution with fluid balance    -EF has reduced substantially in past 1 year per cardiology - poor prognosis  Acute on chronic renal failure     - s/p diuresis      - dc non essential nephrotoxins    Moderate protein calorie malnutrition     - bitermporal wasting with hypolabuminemia      Thank  you for allowing me to participate in the care of this patient.    Patient/Family are satisfied with care plan and all questions have been answered.  This document was prepared using Dragon voice recognition software and may include unintentional dictation errors.     Ottie Glazier, M.D.  Division of [place]

## 2021-06-14 NOTE — Progress Notes (Signed)
Nutrition Follow-up ? ?DOCUMENTATION CODES:  ? ?Not applicable ? ?INTERVENTION:  ? ?-Glucerna Shake po BID, each supplement provides 220 kcal and 10 grams of protein  ?-Ensure Max po daily, each supplement provides 150 kcal and 30 grams of protein.   ?-MVI with minerals daily ? ?NUTRITION DIAGNOSIS:  ? ?Increased nutrient needs related to acute illness as evidenced by estimated needs. ? ?Ongoing ? ?GOAL:  ? ?Patient will meet greater than or equal to 90% of their needs ? ?Progressing  ? ?MONITOR:  ? ?PO intake, Supplement acceptance, Diet advancement, Labs, Weight trends, Skin ? ?REASON FOR ASSESSMENT:  ? ?Malnutrition Screening Tool ?  ? ?ASSESSMENT:  ? ?Pt admitted from home with SOB secondary to acute on chronic respiratory failure with hypoxemia. PMH includes anemia, chronic diastolic CHF with EF of [id number], and T2DM. ? ?Reviewed I/O's: -700 ml x 24 hours and -7 L since admission ? ?UOP: 1.9 L x 24 hours ? ?Spoke with pt and daughter at bedside. Pt reports appetite has improved since admission. Pt had a good lunch and consumed about 50% of her tray. Noted meal completions 60-90%.  ? ?Pt daughter, pt with decreased appetite over the past 6 months. Pt has a liberalized diet at home and daughter prepare whatever pt wants to eat. Pt daughters report that pt often prefers drinking to eating and loves eating soups and Ensure supplements. Pt has been consuming Glucerna supplements here and enjoys them. RD also talked about other available supplements that pt could use at home (Equate Brand, etc).   ? ?Per pt, UBW is around 140# and pt has lost about 6# over the past few months.  ? ?Discussed importance of good meal and supplement intake to promote healing.  ? ?Palliative care following for goals of care discussions.  ? ?Medications reviewed and include empagliflozin and lasix.  ? ?Labs reviewed: CBGS: 59-270 (inpatient orders for glycemic control are 0-9 units insulin aspart daily with meals and 3 units insulin aspart TID  with meals).   ? ?NUTRITION - FOCUSED PHYSICAL EXAM: ? ?Flowsheet Row Most Recent Value  ?Orbital Region Mild depletion  ?Upper Arm Region Moderate depletion  ?Thoracic and Lumbar Region Mild depletion  ?Buccal Region Mild depletion  ?Temple Region Mild depletion  ?Clavicle Bone Region Moderate depletion  ?Clavicle and Acromion Bone Region Moderate depletion  ?Scapular Bone Region Moderate depletion  ?Dorsal Hand Moderate depletion  ?Patellar Region Mild depletion  ?Anterior Thigh Region Mild depletion  ?Posterior Calf Region Mild depletion  ?Edema (RD Assessment) Mild  ?Hair Reviewed  ?Eyes Reviewed  ?Mouth Reviewed  ?Skin Reviewed  ?Nails Reviewed  ? ?  ? ? ?Diet Order:   ?Diet Order   ? ?       ?  Diet Carb Modified Fluid consistency: Thin; Room service appropriate? Yes  Diet effective now       ?  ? ?  ?  ? ?  ? ? ?EDUCATION NEEDS:  ? ?No education needs have been identified at this time ? ?Skin:  Skin Assessment: Skin Integrity Issues: ?Skin Integrity Issues:: Stage II ?Stage II: coccyx ? ?Last BM:  06/13/21 ? ?Height:  ? ?Ht Readings from Last 1 Encounters:  ?06/10/21 5\' 5"  (1.651 m)  ? ? ?Weight:  ? ?Wt Readings from Last 1 Encounters:  ?06/14/21 62.9 kg  ? ?BMI:  Body mass index is 23.07 kg/m?. ? ?Estimated Nutritional Needs:  ? ?Kcal:  1700-1900 ? ?Protein:  85-100 grams ? ?Fluid:  > 1.7 L ? ? ? ?  Loistine Chance, RD, LDN, CDCES ?Registered Dietitian II ?Certified Diabetes Care and Education Specialist ?Please refer to [facility] for RD and/or RD on-call/weekend/after hours pager  ?

## 2021-06-14 NOTE — Progress Notes (Signed)
Physical Therapy Treatment ?Patient Details ?Name: Sierra Guzman ?MRN: [id number] ?DOB: 1940-12-20 ?Today's Date: 06/14/2021 ? ? ?History of Present Illness Pt is an 81 y/o F admitted on 06/10/21 after presenting with c/c of SOB & noted to have low BP & tachycardia. Pt met sepsis criteria but this was later ruled out. Chest x-ray does show PNA. 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 PMH: anemia, chronic diastolic CHF, DM2, anxiety, asthma, CKD stage 3, lumbar DDD, CAD, gout, HLD, idiopathic pulmonary fibrosis, obesity, syncope ? ?  ?PT Comments  ? ? Pt received in Semi-Fowler's position and agreeable to therapy.  Pt reporting that MD requested that she not get up and move around due to her HR.  Clarified with nursing and pt was cleared to ambulate, but to monitor HR throughout.  Pt becomes very anxious and starts having self-reported panic episodes when thinking about walking.  Pt performed bed-level exercises prior to attempting ambulation.  Gown and bedding was saturated upon inspection, so nursing staff replaced sheets while pt ambulated to the door and back to the bed with CGA.  Pt with anxiousness during ambulation, requesting to sit down multiple times prior to getting back to the bed.  Pt with HR in the 120's with ambulation and O2 sats remained >91% on 4L of O2.  Pt then left in bed with call bell and nursing in room.  Current discharge plans to SNF remain appropriate at this time.  Pt will continue to benefit from skilled therapy in order to address deficits listed below. ?  ? ?  ?Recommendations for follow up therapy a ?re one component of a multi-disciplinary discharge planning process, led by the attending physician.  Recommendations may be updated based on patient status, additional functional criteria and insurance authorization. ? ?Follow Up Recommendations ? Skilled nursing-short term rehab (<3 hours/day) ?  ?   ?Assistance Recommended at Discharge Intermittent Supervision/Assistance  ?Patient can return home with the following A little help with walking and/or transfers;A little help with bathing/dressing/bathroom;Assistance with cooking/housework;Assist for transportation;Help with stairs or ramp for entrance ?  ?Equipment Recommendations ? None recommended by PT  ?  ?Recommendations for Other Services   ? ? ?  ?Precautions / Restrictions Precautions ?Precautions: Fall ?Precaution Comments: monitor HR! ?Restrictions ?Weight Bearing Restrictions: No  ?  ? ?Mobility ? Bed Mobility ?Overal bed mobility: Modified Independent ?Bed Mobility: Sit to Supine ?  ?  ?Supine to sit: Supervision, HOB elevated ?Sit to supine: Modified independent (Device/Increase time) ?  ?  ?Patient Response: Anxious ? ?Transfers ?Overall transfer level: Needs assistance ?Equipment used: 1 person hand held assist ?Transfers: Sit to/from Stand ?Sit to Stand: Min guard ?  ?  ?  ?  ?  ?  ?  ? ?Ambulation/Gait ?Ambulation/Gait assistance: Min guard ?Gait Distance (Feet): 16 Feet ?Assistive device: Rolling walker (2 wheels) ?Gait Pattern/deviations: Step-through pattern ?Gait velocity: decreased ?  ?  ?General Gait Details: pt very anxious with ambulation, and has godo strength to perform, however starts to panic with mobility and stating she needs to sit down. ? ? ?Stairs ?  ?  ?  ?  ?  ? ? ?Wheelchair Mobility ?  ? ?Modified Rankin (Stroke Patients Only) ?  ? ? ?  ?Balance Overall balance assessment: Needs assistance ?Sitting-balance support: Feet supported, Bilateral upper extremity supported, No upper extremity supported, Single extremity supported ?Sitting balance-Leahy Scale: Fair ?  ?  ?  Standing balance support: Single extremity supported, During functional activity ?Standing balance-Leahy Scale: Fair ?  ?  ?  ?  ?  ?  ?  ?  ?  ?  ?  ?  ?  ? ?  ?Cognition Arousal/Alertness: Awake/alert ?Behavior During Therapy: [facility] for tasks  assessed/performed ?Overall Cognitive Status: Within Functional Limits for tasks assessed ?  ?  ?  ?  ?  ?  ?  ?  ?  ?  ?  ?  ?  ?  ?  ?  ?  ?  ?  ? ?  ?Exercises Total Joint Exercises ?Ankle Circles/Pumps: AROM, Strengthening, Both, 10 reps, Supine ?Quad Sets: AROM, Strengthening, Both, 10 reps, Supine ?Gluteal Sets: AROM, Strengthening, Both, 10 reps, Supine ?Hip ABduction/ADduction: AROM, Strengthening, Both, 10 reps, Supine ?Straight Leg Raises: AROM, Strengthening, Both, 10 reps, Supine ?Long Arc Quad: AROM, Strengthening, Both, 10 reps, Seated ? ?  ?General Comments General comments (skin integrity, edema, etc.): HR 120's to 150's with SPT to [facility] and SPT to recliner. PLB utilized with VC as needed, Bumped briefly from 4 to 5L O2 with exertion to support recovery. Left on 4L ?  ?  ? ?Pertinent Vitals/Pain Pain Assessment ?Pain Assessment: No/denies pain  ? ? ?Home Living   ?  ?  ?  ?  ?  ?  ?  ?  ?  ?   ?  ?Prior Function    ?  ?  ?   ? ?PT Goals (current goals can now be found in the care plan section) Acute Rehab PT Goals ?Patient Stated Goal: get better, go home ?PT Goal Formulation: With patient ?Time For Goal Achievement: 06/26/21 ?Potential to Achieve Goals: Fair ?Progress towards PT goals: Progressing toward goals ? ?  ?Frequency ? ? ? Min 2X/week ? ? ? ?  ?PT Plan Discharge plan needs to be updated  ? ? ?Co-evaluation   ?  ?  ?  ?  ? ?  ?AM-PAC PT "6 Clicks" Mobility   ?Outcome Measure ? Help needed turning from your back to your side while in a flat bed without using bedrails?: None ?Help needed moving from lying on your back to sitting on the side of a flat bed without using bedrails?: A Little ?Help needed moving to and from a bed to a chair (including a wheelchair)?: A Little ?Help needed standing up from a chair using your arms (e.g., wheelchair or bedside chair)?: A Little ?Help needed to walk in hospital room?: A Little ?Help needed climbing 3-5 steps with a railing? : A Lot ?6 Click Score:  18 ? ?  ?End of Session Equipment Utilized During Treatment: Oxygen;Gait belt ?Activity Tolerance: Treatment limited secondary to medical complications (Comment) ?Patient left: in bed;with call bell/phone within reach;with bed alarm set;with nursing/sitter in room ?Nurse Communication: Mobility status ?PT Visit Diagnosis: Difficulty in walking, not elsewhere classified (R26.2);Muscle weakness (generalized) (M62.81) ?  ? ? ?Time: 3403-5248 ?PT Time Calculation (min) (ACUTE ONLY): 51 min ? ?Charges:  $Gait Training: 8-22 mins ?$Therapeutic Exercise: 23-37 mins          ?          ? ?Gwenlyn Saran, PT, DPT ?06/14/21, 4:54 PM ? ? ? ?Christie Nottingham ?06/14/2021, 4:50 PM ? ?

## 2021-06-14 NOTE — Progress Notes (Signed)
?Progress Note ? ? ?Patient: Sierra Guzman ZOX:[id number] DOB: 1941-03-04 DOA: 06/10/2021     4 ?DOS: the patient was seen and examined on 06/14/2021 ?  ?Brief hospital course: ?81 year old female with past medical history of idiopathic pulmonary fibrosis and chronic respiratory failure (2 L at rest and 4 L with ambulation), CAD, CKD stage III, acute on chronic systolic congestive heart failure presented with acute hypoxic respiratory failure requiring BiPAP on presentation. ? ?We will treat any acute systolic congestive heart failure with IV Lasix and then switched over to oral torsemide.  Pulmonary held the diuretic because she does get dehydrated quite easily.  Limited with medication secondary to hypotension.  Patient on low-dose Toprol but still tachycardic so digoxin was added by cardiology.  Cardiology wanted to do medical management.  Ejection fraction lower on this echocardiogram 30 to 35%.  Palliative care consulted and will follow-up with family again tomorrow. ? ? ? ?Assessment and Plan: ?* Acute on chronic respiratory failure with hypoxemia (HCC) ?Acute on chronic respiratory failure with hypoxemia.  The patient had respiratory distress on presentation requiring BiPAP.  Initial pulse ox 85% on oxygen..  The patient states that she wears 2 L oxygen at rest and 4 L with ambulation.  Patient was on 4 L of oxygen this morning.  ? ? ?Acute on chronic combined systolic and diastolic CHF (congestive heart failure) (HCC) ?This echo showing EF of 30 to 35%.  Continue Toprol-XL low-dose.  With heart rates elevated cardiology added low-dose digoxin.  Heart rate still bouncing around up as high as 150.  Jardiance also added.  Conservative management as per cardiology. Limited with medication secondary to hypotension.  Restart low-dose Lasix 20 mg daily for tomorrow. ? ?Interstitial lung disease (HCC) ?Sepsis ruled out.  I think this is interstitial lung disease exacerbation also.  Pulmonary changed prednisone to  Solu-Medrol. ? ?CKD (chronic kidney disease) stage 3, GFR 30-59 ml/min (HCC) ?Creatinine 0.84 today ? ?Uncontrolled type 2 diabetes mellitus with hyperglycemia, without long-term current use of insulin (HCC) ?Started Jardiance.  Continue sliding scale insulin and NovoLog while on steroids. ? ?Hypotension ?Patient tolerating low-dose Toprol. ? ?Anemia of chronic disease ?Anemia of chronic disease with last ferritin being elevated.  Last hemoglobin 10.4 ? ? ?Hypomagnesemia ?Replaced during the hospital course. ? ?Pressure injury of skin ?Stage II on sacrum and coccyx.  See full description below, present on admission. ? ?Coronary artery disease ?Borderline troponins likely demand ischemia with acute hypoxic respiratory failure ? ?Elevated liver function tests ?Likely secondary to CHF.  AST up to 219 and ALT up to 181.  Total bilirubin 0.6. ? ? ? ? ?  ? ?Subjective: Patient feeling a little bit better today.  Still has some shortness of breath and some cough.  Last night had some abdominal pain but change position that seems better initially admitted with acute hypoxic respiratory failure requiring BiPAP. ? ?Physical Exam: ?Vitals:  ? 06/14/21 0323 06/14/21 0400 06/14/21 0741 06/14/21 1157  ?BP: 104/64  104/65 100/71  ?Pulse: 82  (!) 57 97  ?Resp: 20  20 18   ?Temp: 98.2 ?F (36.8 ?C)  97.7 ?F (36.5 ?C) 98 ?F (36.7 ?C)  ?TempSrc: Oral     ?SpO2: 93%  96% 95%  ?Weight:  62.9 kg    ?Height:      ? ?Physical Exam ?HENT:  ?   Head: Normocephalic.  ?   Mouth/Throat:  ?   Pharynx: No oropharyngeal exudate.  ?Eyes:  ?   General:  Lids are normal.  ?   Conjunctiva/sclera: Conjunctivae normal.  ?Cardiovascular:  ?   Rate and Rhythm: Regular rhythm. Tachycardia present.  ?   Heart sounds: Normal heart sounds, S1 normal and S2 normal.  ?Pulmonary:  ?   Effort: Accessory muscle usage present.  ?   Breath sounds: Examination of the right-lower field reveals decreased breath sounds and rhonchi. Examination of the left-lower field  reveals decreased breath sounds and rhonchi. Decreased breath sounds and rhonchi present. No wheezing or rales.  ?Abdominal:  ?   Palpations: Abdomen is soft.  ?   Tenderness: There is no abdominal tenderness.  ?Musculoskeletal:  ?   Right lower leg: No swelling.  ?   Left lower leg: No swelling.  ?Skin: ?   General: Skin is warm.  ?   Findings: No rash.  ?Neurological:  ?   Mental Status: She is alert and oriented to person, place, and time.  ?  ?Data Reviewed: ?Creatinine down to 0.84, AST up to 219 and ALT up to 181 ? ?Family Communication: Spoke with 1 daughter at the bedside and 1 daughter on the phone ? ?Case discussed with palliative care, cardiology and pulmonology ? ?Disposition: ?Status is: Inpatient ?Remains inpatient appropriate because: Palliative care to follow-up with family again tomorrow.  We are limited with our treatments with heart failure secondary to hypotension.  Patient also very tachycardic at times. ?  ?Planned Discharge Destination: Home with Home Health and palliative services ? ?Author: ?Loletha Grayer, MD ?06/14/2021 2:45 PM ? ?For on call review [email or web address].  ?

## 2021-06-14 NOTE — Hospital Course (Addendum)
81 year old female with past medical history of idiopathic pulmonary fibrosis and chronic respiratory failure (2 L at rest and 4 L with ambulation), CAD, CKD stage III, acute on chronic systolic congestive heart failure presented with acute hypoxic respiratory failure requiring BiPAP on presentation. ? ?Hospital course difficult due to attempting to treat acute systolic congestive heart failure with IV Lasix and then switched over to oral torsemide, but limited with medication secondary to hypotension.  Patient on low-dose Toprol but still tachycardic so digoxin was added by cardiology.  Cardiology wanted to do medical management.  Ejection fraction lower on this echocardiogram 30 to 35%.  Palliative care consulted and met with family on 3/7.  Consideration for further disposition to be determined.  In follow-up, family would like to take patient home with hospice treating basic medical issues with no extraordinary measures, on 3/10. ?

## 2021-06-14 NOTE — Care Management Important Message (Signed)
Important Message ? ?Patient Details  ?Name: Sierra Guzman ?MRN: [id number] ?Date of Birth: October 23, 1940 ? ? ?Medicare Important Message Given:  Yes ? ? ? ? ?[name] ?06/14/2021, 9:55 AM ?

## 2021-06-14 NOTE — Progress Notes (Signed)
Mobility Specialist - Progress Note ? ? 06/14/21 1100  ?Mobility  ?Activity Dangled on edge of bed  ?Level of Assistance Standby assist, set-up cues, supervision of patient - no hands on  ?Assistive Device None  ?Distance Ambulated (ft) 0 ft  ?Activity Response Tolerated well  ?$Mobility charge 1 Mobility  ? ? ?Pt lying in bed upon arrival, utilizing 4L. HR ranging 110-150s at rest, pt without complaint. Pt requested assistance to recliner. MD entered for lengthy discussion with pt and family regarding plan of care. No physical assist to achieve EOB for seated bath with NT. Pt left EOB upon OT arrival.  ? ? ?Kathee Delton ?Mobility Specialist ?06/14/21, 11:57 AM ? ? ? ?

## 2021-06-14 NOTE — Progress Notes (Signed)
Hypoglycemic Event ? ?CBG: 59 ? ?Treatment: 4 oz juice/soda ? ?Symptoms: None ? ?Follow-up CBG: Time:0011 CBG Result:106 ? ?Possible Reasons for Event: Medication regimen: new regimen ? ?Comments/MD notified: ?On-call notified. New orders given.    ? ? ? ? ? ? ?

## 2021-06-14 NOTE — Consult Note (Signed)
Consultation Note Date: 06/14/2021   Patient Name: Sierra Guzman  DOB: 08-Nov-1940  MRN: [id number]  Age / Sex: 81 y.o., female  PCP: Wardell Honour, MD Referring Physician: Loletha Grayer, MD  Reason for Consultation: Establishing goals of care  HPI/Patient Profile: 81 y.o. female  with past medical history of anemia, chronic diastolic heart failure with a EF of 45%, HTN/HLD, DM 2, anxiety, CAD, CKD 3, aortic valve sclerosis, lumbar degenerative disc disease with back surgery 2006, OA bilateral knees and hip, GERD, left hip arthroplasty 2015/right hip 2018, bilateral total knee arthroplasty, admitted on 06/10/2021 with acute on chronic respiratory failure with hypoxemia.   Clinical Assessment and Goals of Care: I have reviewed medical records including EPIC notes, labs and imaging, received report from RN, assessed the patient.  Sierra Guzman is sitting up in the [place] chair in her room.  She greets me, making and mostly keeping eye contact.  She appears chronically ill and somewhat frail and thin.  She is alert and oriented, able to make her basic needs known.  Her daughter, Sierra Guzman, is at bedside.    We meet at the bedside along with daughter Sierra Guzman present and daughter Sierra Guzman via phone, to discuss diagnosis prognosis, GOC, EOL wishes, disposition and options.  I introduced Palliative Medicine as specialized medical care for people living with serious illness. It focuses on providing relief from the symptoms and stress of a serious illness. The goal is to improve quality of life for both the patient and the family.  We focused on their current illness.  We talk in detail about heart failure as a progressive disease.  We talk about interstitial lung disease as a progressive disease.  We talked about symptom management for each and finding a balance for comfort and treatment.  We talked about the use of medications for  symptom management.  The natural disease trajectory and expectations at EOL were discussed.  Advanced directives, concepts specific to code status, were considered and discussed.  We talked about the concept of "treat the treatable, but allowing natural passing.  Family states that they are going to have goals of care discussions/CODE STATUS discussions as a family later tonight.  Hospice and Palliative Care services outpatient were explained and offered.  We talk in detail about what is and is not provided with outpatient palliative and "treat the treatable" hospice care.  We talked about the benefits of symptom management and relationship building.  We talk about in-home visits for each.  Discussed the importance of continued conversation with family and the medical providers regarding overall plan of care and treatment options, ensuring decisions are within the context of the patients values and GOCs.  Questions and concerns were addressed.  The family was encouraged to call with questions or concerns.  PMT will continue to support holistically.  Conference with attending, pulmonary, cardiology, bedside nursing staff, transition of care team related to patient condition, needs, goals of care, disposition. PMT to follow-up with patient and family tomorrow.  HCPOA NEXT OF  KIN -Sierra Guzman names her 2 daughters as her healthcare surrogates.    SUMMARY OF RECOMMENDATIONS   At this point continue full scope/full code Home with home health if qualified Considering outpatient palliative versus hospice care Considering CODE STATUS   Code Status/Advance Care Planning: Full code -we talked about the concept of "treat the treatable, but allowing natural passing.  Family states that they will discuss CODE STATUS together today.  Symptom Management:  Per hospitalist, no additional needs at this time.  Palliative Prophylaxis:  Frequent Pain Assessment, Palliative Wound Care, and Turn  Reposition  Additional Recommendations (Limitations, Scope, Preferences): Full Scope Treatment  Psycho-social/Spiritual:  Desire for further Chaplaincy support:no Additional Recommendations: Caregiving  Support/Resources and Education on Hospice  Prognosis:  < 6 months, or less would not be surprising based on chronic illness burden, functional status.  Discharge Planning:  Anticipate home with home health if needed.  Considering outpatient palliative versus treat the treatable hospice care       Primary Diagnoses: Present on Admission:  CKD (chronic kidney disease) stage 3, GFR 30-59 ml/min (HCC)  (Resolved) Sepsis ([place])  Acute on chronic respiratory failure with hypoxemia (HCC)  Acute on chronic combined systolic and diastolic CHF (congestive heart failure) (HCC)  Hypertension  (Resolved) Acute and chronic respiratory failure (acute-on-chronic) (HCC)  Coronary artery disease  Pressure injury of skin  Hypomagnesemia   I have reviewed the medical record, interviewed the patient and family, and examined the patient. The following aspects are pertinent.  Past Medical History:  Diagnosis Date   Anemia 10/28/2010   unspecified   Anxiety    Aortic valve sclerosis    Arthritis    Asthma    Chronic combined systolic and diastolic CHF (congestive heart failure) ([place])    echo- 10/22 LVEF 45-55%, G1DD, moderately reduced RV fxn, mild aortic regurg/stenosis   Chronic kidney disease    Chronic Kidney Disease---Stage III   Coronary artery disease 10/28/2010   DDD (degenerative disc disease), lumbar 2006   Diabetes type 2, controlled ([place]) 10/28/2010   Dyspnea    GERD (gastroesophageal reflux disease)    Gout 10/28/2010   Heart murmur    History of cardiac catheterization 2006   LAD 50%, D1 50% rest normal.    History of cardiovascular stress test 09/23/2009    lexiscan Normal Cardiolite test, EF 61%. LV size and function - normal   History of echocardiogram 05/27/2008   EF  55%; Mild aortic insufficiency with minimal aortic sclerosis. L vent normal.    Hyperlipidemia    Hyperlipidemia LDL goal <100 10/28/2010   managed by Dr. Dorena Bodo   Hypertension 10/28/2010   IPF (idiopathic pulmonary fibrosis) ([place])    Progressive, with UIP   Mild vitamin D deficiency 02/22/2011   Obesity (BMI 30-39.9) 10/28/2010   unspecified   Osteoarthritis 10/28/2010   bilateral knees, left hip   Personal history of colonic polyps 10/28/2010   Shortness of breath    Syncope 2013   Vitamin B 12 deficiency 10/28/2010   Social History   Socioeconomic History   Marital status: Married    Spouse name: Not on file   Number of children: Not on file   Years of education: Not on file   Highest education level: Not on file  Occupational History   Not on file  Tobacco Use   Smoking status: Former    Packs/day: 0.25    Years: 10.00    Pack years: 2.50    Types: Cigarettes  Quit date: 08/18/1990    Years since quitting: 30.8   Smokeless tobacco: Never  Vaping Use   Vaping Use: Never used  Substance and Sexual Activity   Alcohol use: No   Drug use: No   Sexual activity: Not Currently  Other Topics Concern   Not on file  Social History Narrative   Not on file   Social Determinants of Health   Financial Resource Strain: Not on file  Food Insecurity: Not on file  Transportation Needs: Not on file  Physical Activity: Not on file  Stress: Not on file  Social Connections: Not on file   Family History  Problem Relation Age of Onset   Breast cancer Mother 42   Diabetes Mother    Stroke Mother    Stroke Brother    Breast cancer Cousin        pat cousin   Scheduled Meds:  azithromycin  250 mg Oral Daily   budesonide (PULMICORT) nebulizer solution  0.25 mg Nebulization BID   Chlorhexidine Gluconate Cloth  6 each Topical Daily   dextromethorphan-guaiFENesin  1 tablet Oral BID   dextrose  1 ampule Intravenous Once   digoxin  0.125 mg Oral Daily   empagliflozin  10 mg  Oral Daily   enoxaparin (LOVENOX) injection  40 mg Subcutaneous Q24H   feeding supplement (GLUCERNA SHAKE)  237 mL Oral TID BM   insulin aspart  0-9 Units Subcutaneous TID WC   insulin aspart  3 Units Subcutaneous TID WC   ipratropium-albuterol  3 mL Nebulization TID   lidocaine  1 patch Transdermal Q24H   mouth rinse  15 mL Mouth Rinse BID   methylPREDNISolone (SOLU-MEDROL) injection  40 mg Intravenous Q24H   metoprolol succinate  12.5 mg Oral Daily   pantoprazole (PROTONIX) IV  40 mg Intravenous Q24H   sodium chloride flush  3 mL Intravenous Q12H   Continuous Infusions:  sodium chloride     sodium chloride     PRN Meds:.sodium chloride, acetaminophen **OR** acetaminophen, ALPRAZolam, dextrose, hydrALAZINE, ipratropium-albuterol, metoprolol tartrate, sodium chloride flush Medications Prior to Admission:  Prior to Admission medications   Medication Sig Start Date End Date Taking? Authorizing Provider  magnesium oxide (MAG-OX) 400 MG tablet Take 1 tablet by mouth daily. 07/21/20  Yes [provider]  sertraline (ZOLOFT) 25 MG tablet Take 25 mg by mouth daily. 06/03/21 06/03/22 Yes [provider]  acetaminophen (TYLENOL) 325 MG tablet Take 2 tablets (650 mg total) by mouth every 6 (six) hours as needed (pain). 01/17/21   Arrien, Jimmy Picket, MD  albuterol (ACCUNEB) 0.63 MG/3ML nebulizer solution Take 3 mLs by nebulization 3 (three) times daily.    [provider]  albuterol (PROVENTIL) (2.5 MG/3ML) 0.083% nebulizer solution Take 3 mLs (2.5 mg total) by nebulization every 2 (two) hours as needed for wheezing or shortness of breath. 03/10/21   Swayze, Ava, DO  allopurinol (ZYLOPRIM) 100 MG tablet Take 100 mg by mouth daily.    [provider]  aspirin EC 81 MG EC tablet Take 1 tablet (81 mg total) by mouth daily. Swallow whole. 01/18/21   Arrien, Jimmy Picket, MD  budesonide (PULMICORT) 0.25 MG/2ML nebulizer solution Take 2 mLs (0.25 mg total) by  nebulization 2 (two) times daily. 03/10/21   Swayze, Ava, DO  cholecalciferol (VITAMIN D) 1000 units tablet Take 1,000 Units by mouth daily.    [provider]  COVID-19 mRNA bivalent vaccine, Pfizer, (PFIZER COVID-19 VAC BIVALENT) injection Inject into the muscle. 04/28/21  Carlyle Basques, MD  gabapentin (NEURONTIN) 100 MG capsule Take 1 capsule (100 mg total) by mouth 3 (three) times daily. Patient not taking: Reported on 06/13/2021 03/10/21   Swayze, Ava, DO  guaiFENesin-dextromethorphan (ROBITUSSIN DM) 100-10 MG/5ML syrup Take 5 mLs by mouth every 6 (six) hours as needed for cough. Patient not taking: Reported on 06/13/2021 01/17/21   Arrien, Jimmy Picket, MD  loratadine (CLARITIN) 10 MG tablet Take 10 mg by mouth daily.    [provider]  metFORMIN (GLUCOPHAGE) 850 MG tablet Take 1 tablet (850 mg total) by mouth 2 (two) times daily with a meal. 01/19/21 01/19/22  Max Sane, MD  metoprolol succinate (TOPROL-XL) 25 MG 24 hr tablet Take 12.5 mg by mouth daily.    [provider]  mirtazapine (REMERON) 15 MG tablet Take 15 mg by mouth at bedtime. 06/02/21   [provider]  NAC 600 MG CAPS Take 600 mg by mouth 2 (two) times daily. 05/17/21   [provider]  Nystatin (GERHARDT'S BUTT CREAM) CREA Apply 1 application topically 3 (three) times daily. Patient not taking: Reported on 03/07/2021 01/03/21   Arrien, Jimmy Picket, MD  Omega-3 Fatty Acids (FISH OIL) 1000 MG CAPS Take 1,000 mg by mouth daily.    [provider]  omeprazole (PRILOSEC) 20 MG capsule Take 20 mg by mouth daily. 11/05/20   [provider]  Pirfenidone 267 MG TABS Take 267 mg by mouth 3 (three) times daily.    [provider]  polyethylene glycol (MIRALAX / GLYCOLAX) 17 g packet Take 17 g by mouth daily. 12/19/20   Shelly Coss, MD  predniSONE (DELTASONE) 10 MG tablet Take 1 tablet (10 mg total) by mouth daily. 03/10/21   Swayze, Ava, DO  rosuvastatin  (CRESTOR) 40 MG tablet Take 40 mg by mouth daily. 09/17/20   [provider]  sodium chloride (OCEAN) 0.65 % SOLN nasal spray Place 2 sprays into both nostrils as needed for congestion. 03/10/21   Swayze, Ava, DO  Vitamin D, Ergocalciferol, (DRISDOL) 1.25 MG (50000 UNIT) CAPS capsule Take 1 capsule (50,000 Units total) by mouth every 7 (seven) days. Patient not taking: Reported on 03/07/2021 12/22/20   Shelly Coss, MD  enoxaparin (LOVENOX) 40 MG/0.4ML injection Inject 0.4 mLs (40 mg total) into the skin daily. 12/15/16 03/25/19  Duanne Guess, PA-C  pregabalin (LYRICA) 50 MG capsule Take 1 capsule (50 mg total) by mouth 3 (three) times daily. 12/18/16 03/25/19  Latanya Maudlin, NP  ranitidine (ZANTAC) 300 MG capsule Take 300 mg by mouth every evening.  03/25/19  [provider]  simvastatin (ZOCOR) 40 MG tablet Take 40 mg by mouth daily at 6 PM.  03/25/19  [provider]   Allergies  Allergen Reactions   Morphine And Related Nausea And Vomiting   Review of Systems  Unable to perform ROS: Age   Physical Exam Vitals and nursing note reviewed.  Constitutional:      General: She is not in acute distress.    Appearance: She is not ill-appearing.  HENT:     Head: Normocephalic and atraumatic.  Cardiovascular:     Rate and Rhythm: Normal rate.  Pulmonary:     Effort: Pulmonary effort is normal. No tachypnea.  Musculoskeletal:     Right lower leg: No edema.     Left lower leg: No edema.  Skin:    General: Skin is warm and dry.  Neurological:     Mental Status: She is alert and oriented  to person, place, and time.  Psychiatric:        Mood and Affect: Mood normal.        Behavior: Behavior normal.    Vital Signs: BP 100/71 (BP Location: Left Arm)    Pulse 97    Temp 98 F (36.7 C)    Resp 18    Ht 5\' 5"  (1.651 m)    Wt 62.9 kg    SpO2 95%    BMI 23.07 kg/m  Pain Scale: 0-10   Pain Score: 0-No pain   SpO2: SpO2: 95 % O2 Device:SpO2: 95 % O2 Flow  Rate: .O2 Flow Rate (L/min): 4 L/min (4-5L)  IO: Intake/output summary:  Intake/Output Summary (Last 24 hours) at 06/14/2021 1328 Last data filed at 06/14/2021 0700 Gross per 24 hour  Intake 480 ml  Output 1900 ml  Net -1420 ml    LBM: Last BM Date : 06/13/21 Baseline Weight: Weight: 63.9 kg Most recent weight: Weight: 62.9 kg     Palliative Assessment/Data:   Flowsheet Rows    Flowsheet Row Most Recent Value  Intake Tab   Referral Department Hospitalist  Unit at Time of Referral Intermediate Care Unit  Palliative Care Primary Diagnosis Pulmonary  Date Notified 06/13/21  Palliative Care Type New Palliative care  Reason for referral Clarify Goals of Care  Date of Admission 06/10/21  Date first seen by Palliative Care 06/14/21  # of days Palliative referral response time 1 Day(s)  # of days IP prior to Palliative referral 3  Clinical Assessment   Palliative Performance Scale Score 40%  Pain Max last 24 hours Not able to report  Pain Min Last 24 hours Not able to report  Dyspnea Max Last 24 Hours Not able to report  Dyspnea Min Last 24 hours Not able to report  Psychosocial & Spiritual Assessment   Palliative Care Outcomes        Time In: 1040 Time Out: 1155 Time Total: 75 minutes  Greater than 50%  of this time was spent counseling and coordinating care related to the above assessment and plan.  Signed by: Drue Novel, NP   Please contact Palliative Medicine Team phone at [phone] for questions and concerns.  For individual provider: See Shea Evans

## 2021-06-15 DIAGNOSIS — I4891 Unspecified atrial fibrillation: Secondary | ICD-10-CM

## 2021-06-15 DIAGNOSIS — Z515 Encounter for palliative care: Secondary | ICD-10-CM | POA: Diagnosis not present

## 2021-06-15 DIAGNOSIS — Z7189 Other specified counseling: Secondary | ICD-10-CM | POA: Diagnosis not present

## 2021-06-15 DIAGNOSIS — E440 Moderate protein-calorie malnutrition: Secondary | ICD-10-CM | POA: Diagnosis present

## 2021-06-15 DIAGNOSIS — N1831 Chronic kidney disease, stage 3a: Secondary | ICD-10-CM | POA: Diagnosis not present

## 2021-06-15 DIAGNOSIS — J9621 Acute and chronic respiratory failure with hypoxia: Secondary | ICD-10-CM | POA: Diagnosis not present

## 2021-06-15 DIAGNOSIS — I5043 Acute on chronic combined systolic (congestive) and diastolic (congestive) heart failure: Secondary | ICD-10-CM | POA: Diagnosis not present

## 2021-06-15 LAB — CBC
HCT: 33.4 % — ABNORMAL LOW (ref 36.0–46.0)
Hemoglobin: 10.2 g/dL — ABNORMAL LOW (ref 12.0–15.0)
MCH: 30.9 pg (ref 26.0–34.0)
MCHC: 30.5 g/dL (ref 30.0–36.0)
MCV: 101.2 fL — ABNORMAL HIGH (ref 80.0–100.0)
Platelets: 247 10*3/uL (ref 150–400)
RBC: 3.3 MIL/uL — ABNORMAL LOW (ref 3.87–5.11)
RDW: 14.1 % (ref 11.5–15.5)
WBC: 9 10*3/uL (ref 4.0–10.5)
nRBC: 0 % (ref 0.0–0.2)

## 2021-06-15 LAB — COMPREHENSIVE METABOLIC PANEL
ALT: 128 U/L — ABNORMAL HIGH (ref 0–44)
AST: 90 U/L — ABNORMAL HIGH (ref 15–41)
Albumin: 3.2 g/dL — ABNORMAL LOW (ref 3.5–5.0)
Alkaline Phosphatase: 73 U/L (ref 38–126)
Anion gap: 9 (ref 5–15)
BUN: 39 mg/dL — ABNORMAL HIGH (ref 8–23)
CO2: 36 mmol/L — ABNORMAL HIGH (ref 22–32)
Calcium: 10 mg/dL (ref 8.9–10.3)
Chloride: 92 mmol/L — ABNORMAL LOW (ref 98–111)
Creatinine, Ser: 1.01 mg/dL — ABNORMAL HIGH (ref 0.44–1.00)
GFR, Estimated: 56 mL/min — ABNORMAL LOW (ref >60)
Glucose, Bld: 151 mg/dL — ABNORMAL HIGH (ref 70–99)
Potassium: 4.6 mmol/L (ref 3.5–5.1)
Sodium: 137 mmol/L (ref 135–145)
Total Bilirubin: 0.7 mg/dL (ref 0.3–1.2)
Total Protein: 6.3 g/dL — ABNORMAL LOW (ref 6.5–8.1)

## 2021-06-15 LAB — CULTURE, BLOOD (ROUTINE X 2)
Culture: NO GROWTH
Special Requests: ADEQUATE

## 2021-06-15 LAB — GLUCOSE, CAPILLARY
Glucose-Capillary: 107 mg/dL — ABNORMAL HIGH (ref 70–99)
Glucose-Capillary: 129 mg/dL — ABNORMAL HIGH (ref 70–99)
Glucose-Capillary: 165 mg/dL — ABNORMAL HIGH (ref 70–99)
Glucose-Capillary: 172 mg/dL — ABNORMAL HIGH (ref 70–99)
Glucose-Capillary: 227 mg/dL — ABNORMAL HIGH (ref 70–99)
Glucose-Capillary: 236 mg/dL — ABNORMAL HIGH (ref 70–99)
Glucose-Capillary: 95 mg/dL (ref 70–99)

## 2021-06-15 LAB — C-REACTIVE PROTEIN: CRP: 0.6 mg/dL (ref <1.0)

## 2021-06-15 LAB — FUNGITELL, SERUM: Fungitell Result: <31 pg/mL (ref <80)

## 2021-06-15 MED ORDER — PREDNISONE 20 MG PO TABS
40.0000 mg | ORAL_TABLET | Freq: Every day | ORAL | Status: DC
Start: 2021-06-16 — End: 2021-06-17
  Administered 2021-06-16 – 2021-06-17 (×2): 40 mg via ORAL
  Filled 2021-06-15 (×2): qty 2

## 2021-06-15 MED ORDER — PANTOPRAZOLE SODIUM 40 MG PO TBEC
40.0000 mg | DELAYED_RELEASE_TABLET | Freq: Every day | ORAL | Status: DC
  Administered 2021-06-15 – 2021-06-16 (×2): 40 mg via ORAL
  Filled 2021-06-15 (×2): qty 1

## 2021-06-15 MED ORDER — SERTRALINE HCL 50 MG PO TABS
25.0000 mg | ORAL_TABLET | Freq: Every day | ORAL | Status: DC
  Administered 2021-06-15 – 2021-06-16 (×2): 25 mg via ORAL
  Filled 2021-06-15 (×2): qty 1

## 2021-06-15 MED ORDER — MIRTAZAPINE 15 MG PO TABS
15.0000 mg | ORAL_TABLET | Freq: Every day | ORAL | Status: DC
  Administered 2021-06-15 – 2021-06-16 (×2): 15 mg via ORAL
  Filled 2021-06-15 (×2): qty 1

## 2021-06-15 NOTE — Assessment & Plan Note (Signed)
Nutrition Status: ?Nutrition Problem: Moderate Malnutrition ?Etiology: chronic illness (CHF) ?Signs/Symptoms: mild fat depletion, moderate fat depletion, mild muscle depletion, moderate muscle depletion, energy intake < or equal to 75% for > or equal to 1 month ?Interventions: Glucerna shake, Premier Protein, MVI ? ? ? ?

## 2021-06-15 NOTE — Progress Notes (Signed)
Palliative: ?Sierra Guzman is resting quietly in bed with PT at bedside.  She greets me, making and mostly keeping eye contact.  She is alert and oriented, able to make her basic needs known.  She states that she and her family have talked about going home with hospice, but she prefers for me to call her daughter, Sierra Guzman. ? ?Call to daughter Sierra Guzman, no answer, left voicemail message.  Returned call from Sierra Guzman, but unable to answer due to being on the line with another patient family. ? ?Return call to daughter Sierra Guzman.  Sierra Guzman states that they are indeed wanting to go home with "treat the treatable" hospice care we talked about provider choice.  Sierra Guzman states that [name] neighbor had a wonderful experience with hospice and she wants to ask what provider they used.  She shares that she will discuss with her sisters and they will let the team know tomorrow of their hospice provider choice. ? ?Conference with attending, bedside nursing staff, transition of care team related to patient condition, needs, goals of care, disposition. ? ?Plan:    At this point continue to treat the treatable, home with "treat the treatable" hospice care.  Would benefit from further CODE STATUS discussions. ? ?50 minutes  ?Sierra Carmel, NP ?Palliative medicine team ?Team phone [phone] ?Greater than 50% of this time was spent counseling and coordinating care related to the above assessment and plan. ?

## 2021-06-15 NOTE — Progress Notes (Signed)
Mobility Specialist - Progress Note ? ? 06/15/21 1531  ?Mobility  ?Activity Transferred to/from BSC;Stood at bedside  ?Level of Assistance Standby assist, set-up cues, supervision of patient - no hands on  ?Assistive Device Front wheel walker  ?Distance Ambulated (ft) 2 ft  ?Activity Response Tolerated well  ?$Mobility charge 1 Mobility  ? ? ? ?Pt transferred to/from chair-BSC for BM with supervision. A little anxious. Max HR 126 bpm, O2 maintained > 88% however per pt request O2 bumped up to 5L d/t feeling SOB with transfer. Noted soiled gown and linen. Mobility assisted pt into clean gown and provided new chucks. Pt left in recliner with needs in reach. ? ?Filiberto Pinks ?Mobility Specialist ?06/15/21, 3:37 PM ? ? ? ? ?

## 2021-06-15 NOTE — Progress Notes (Signed)
Physical Therapy Treatment ?Patient Details ?Name: Sierra Guzman ?MRN: [id number] ?DOB: [birth year] ?Today's Date: 06/15/2021 ? ? ?History of Present Illness Pt is an 81 y/o F admitted on 06/10/21 after presenting with c/c of SOB & noted to have low BP & tachycardia. Pt met sepsis criteria but this was later ruled out. Chest x-ray does show PNA. 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 PMH: anemia, chronic diastolic CHF, DM2, anxiety, asthma, CKD stage 3, lumbar DDD, CAD, gout, HLD, idiopathic pulmonary fibrosis, obesity, syncope ? ?  ?PT Comments  ? ? Pt was long sitting in bed upon arriving. She agrees to session after taking anti-anxiety medication ~ 30 minutes prior. Pt is A and O x 4 but severely deconditioned and anxious about mobility. She was on 4 L o2 upon arriving to room. Overall pt is strong. She did not require assistance to exit bed, stand, or even ambulated however only able to ambulate very short distance prior to have severe SOB/HR elevation to 138 bpm. Pt on 4 L O2 throughout most of session with sao2 >88%. She does request increase to 6 L o2 to recover after only ambulating to doorway of room and back.  I do not feel going to SNF is going to improve this with pt's extensive cardiopulmonary Hx. Recommend DC home with HHPT to follow.  ?  ?Recommendations for follow up therapy are one component of a multi-disciplinary discharge planning process, led by the attending physician.  Recommendations may be updated based on patient status, additional functional criteria and insurance authorization. ? ?Follow Up Recommendations ? Skilled nursing-short term rehab (<3 hours/day) ?  ?  ?Assistance Recommended at Discharge Intermittent Supervision/Assistance  ?Patient can return home with the following A little help with walking and/or transfers;A little help with bathing/dressing/bathroom;Assistance with cooking/housework;Assist  for transportation;Help with stairs or ramp for entrance ?  ?Equipment Recommendations ? None recommended by PT  ?  ?   ?Precautions / Restrictions Precautions ?Precautions: Fall ?Precaution Comments: closely watch HR/O2 ?Restrictions ?Weight Bearing Restrictions: No  ?  ? ?Mobility ? Bed Mobility ?Overal bed mobility: Modified Independent ?  ?  ?Transfers ?Overall transfer level: Needs assistance ?Equipment used: Rolling walker (2 wheels) ?Transfers: Sit to/from Stand ?Sit to Stand: Min guard, Supervision ?  ?   ?General transfer comment: Pt requested use of gait belt. Needs to sit EOB to relieve anxiey/slight SOB prior to standing and ambulating. no physical lifting assistance to stand to RW. ?  ? ?Ambulation/Gait ?Ambulation/Gait assistance: Min guard ?Gait Distance (Feet): 20 Feet ?Assistive device: Rolling walker (2 wheels) ?Gait Pattern/deviations: Step-through pattern ?Gait velocity: decreased ?  ?  ?General Gait Details: Pt was able to ambulate 20 ft with RW without LOB howeve5r becomes verty SOB with HR into upper 130s. sao2 was > 88% on 4 L however pt requested increased o2 supply due to SOB. As requested, author adjusted O2 to 6 L for ~ 2 minutes until pt is able to control SOB. pt severely deconditioned and required ~ 8 minutes total prior to requesting not to get back up and ambulate. ? ? ? ?  ?Balance Overall balance assessment: Needs assistance ?Sitting-balance support: Feet supported, Bilateral upper extremity supported, No upper extremity supported, Single extremity supported ?Sitting balance-Leahy Scale: Fair ?  ?  ?Standing balance support: Bilateral upper extremity supported, During functional activity ?Standing balance-Leahy Scale: Fair ?  ?  ?   ?Cognition Arousal/Alertness:  Awake/alert ?Behavior During Therapy: Anxious (required taking anti-anxiety med prior to session) ?Overall Cognitive Status: Within Functional Limits for tasks assessed ?  ?   ?General Comments: Pt is A and oriented but is  extremely anxious. Was issued xanex prior to session. ?  ?  ? ?  ?   ?General Comments General comments (skin integrity, edema, etc.): pt has rollator for home use. Recommend use of AD at all times when DC'd. ?  ?  ? ?Pertinent Vitals/Pain Pain Assessment ?Pain Assessment: 0-10 ?Pain Score: 6  ?Faces Pain Scale: Hurts little more ?Pain Location: buttocks from sitting ?Pain Descriptors / Indicators: Discomfort ?Pain Intervention(s): Limited activity within patient's tolerance, Monitored during session, Premedicated before session, Repositioned  ? ? ? ?PT Goals (current goals can now be found in the care plan section) Acute Rehab PT Goals ?Patient Stated Goal: get better, go home with hospice helping ?Progress towards PT goals: Progressing toward goals ? ?  ?Frequency ? ? ? Min 2X/week ? ? ? ?  ?PT Plan Current plan remains appropriate  ? ? ?   ?AM-PAC PT "6 Clicks" Mobility   ?Outcome Measure ? Help needed turning from your back to your side while in a flat bed without using bedrails?: None ?Help needed moving from lying on your back to sitting on the side of a flat bed without using bedrails?: A Little ?Help needed moving to and from a bed to a chair (including a wheelchair)?: A Little ?Help needed standing up from a chair using your arms (e.g., wheelchair or bedside chair)?: A Little ?Help needed to walk in hospital room?: A Little ?Help needed climbing 3-5 steps with a railing? : A Little ?6 Click Score: 19 ? ?  ?End of Session Equipment Utilized During Treatment: Oxygen;Gait belt ?Activity Tolerance: Other (comment);Patient limited by fatigue (anxiety) ?Patient left: in chair;with call bell/phone within reach;with chair alarm set ?Nurse Communication: Mobility status ?PT Visit Diagnosis: Difficulty in walking, not elsewhere classified (R26.2);Muscle weakness (generalized) (M62.81) ?  ? ? ?Time: 2811-8867 ?PT Time Calculation (min) (ACUTE ONLY): 32 min ? ?Charges:  $Gait Training: 8-22 mins ?$Therapeutic Activity:  8-22 mins          ?          ?Julaine Fusi PTA ?06/15/21, 1:58 PM  ? ?

## 2021-06-15 NOTE — Progress Notes (Signed)
PULMONOLOGY         Date: 06/15/2021,   MRN# [id number] Sierra Guzman Apr 17, 1940     AdmissionWeight: 63.9 kg                 CurrentWeight: 61.3 kg   Referring physician: Dr Renae Gloss   CHIEF COMPLAINT:   Acute on chronic hypoxemic respiratory failure   HISTORY OF PRESENT ILLNESS   This 81 year old female with a history of interstitial lung disease and chronic hypoxemia on supplemental O2 at with recurrent exacerbations, CHF with reduced LVEF at 45%, chronic diabetes mellitus.  She was evaluated by me in the office and at that time was in mild to moderate respiratory distress and acute on chronic hypoxemia but adamantly declined being hospitalized at that time.  During outpatient visit she did receive intramuscular 20 mg Solu-Medrol as well as increased supplemental oxygen and half liter of resuscitative fluids due to clinical findings of dehydration including skin tenting on all 4 extremities with poor urine output per family.  After this outpatient treatment she improved substantially and actually was able to take a nap in our clinic for over 2 hours while the nurse watched over her.  This improvement however was very transient and short-lived and patient again became worse and finally did come to the ER.  She received nonrebreather in the emergency room as well as resuscitative fluids and stated she improved.  Family reports severe anxiety disorder with recurrent panic attacks.  Patient also received 2 L of resuscitative fluids on admission as well as empiric antibiotics with vancomycin, cefepime, azithromycin.  She was admitted to the medical floor and pulmonary consultation was placed due to complex pulmonary history with interstitial lung disease, chronic hypoxemia and recurrent exacerbations of underlying ILD.   06/13/21- patient is slightly improved s/p >6L urine output with diuresis.  She is nonlabored nontachypneic during my evaluation.  She had cardiology evaluation today.   Myself and Dr Kirke Corin spoke with Sierra Guzman on phone regarding palliative care evaluation   06/14/21- patient with no acute events overnight. She appears more comfortable.  I spoke with daughter Sierra Guzman and reviewed medical plan. She has palliative care evaluation.   06/15/21- reviewed care plan with daughter Sierra Guzman today. Patient is improved, optimizing for DC planning.   PAST MEDICAL HISTORY   Past Medical History:  Diagnosis Date   Anemia 10/28/2010   unspecified   Anxiety    Aortic valve sclerosis    Arthritis    Asthma    Chronic combined systolic and diastolic CHF (congestive heart failure) (HCC)    echo- 10/22 LVEF 45-55%, G1DD, moderately reduced RV fxn, mild aortic regurg/stenosis   Chronic kidney disease    Chronic Kidney Disease---Stage III   Coronary artery disease 10/28/2010   DDD (degenerative disc disease), lumbar 2006   Diabetes type 2, controlled (HCC) 10/28/2010   Dyspnea    GERD (gastroesophageal reflux disease)    Gout 10/28/2010   Heart murmur    History of cardiac catheterization 2006   LAD 50%, D1 50% rest normal.    History of cardiovascular stress test 09/23/2009    lexiscan Normal Cardiolite test, EF 61%. LV size and function - normal   History of echocardiogram 05/27/2008   EF 55%; Mild aortic insufficiency with minimal aortic sclerosis. L vent normal.    Hyperlipidemia    Hyperlipidemia LDL goal <100 10/28/2010   managed by Dr. Burr Medico   Hypertension 10/28/2010   IPF (idiopathic pulmonary fibrosis) (HCC)  Progressive, with UIP   Mild vitamin D deficiency 02/22/2011   Obesity (BMI 30-39.9) 10/28/2010   unspecified   Osteoarthritis 10/28/2010   bilateral knees, left hip   Personal history of colonic polyps 10/28/2010   Shortness of breath    Syncope 2013   Vitamin B 12 deficiency 10/28/2010     SURGICAL HISTORY   Past Surgical History:  Procedure Laterality Date   BACK SURGERY  2006   DDD   BREAST BIOPSY Right    neg   CARDIAC CATHETERIZATION   2006   EYE SURGERY Bilateral    Cataract Extraction with IOL   JOINT REPLACEMENT Left    Left Total Hip Replacement   JOINT REPLACEMENT     RIGHT 2005; LEFT 2 X 2004, 2011   REPLACEMENT TOTAL KNEE Left    REVISION TOTAL KNEE ARTHROPLASTY Left    Dr. McConnells Right 12/12/2016   Procedure: TOTAL HIP ARTHROPLASTY ANTERIOR APPROACH;  Surgeon: Hessie Knows, MD;  Location: [facility];  Service: Orthopedics;  Laterality: Right;   TOTAL HIP ARTHROPLASTY Left 12/26/2013   Procedure: ARTHROPLASTY HIP TOTAL; Surgeon: Zorita Pang, MD; Location: [place]; Service: Orthopedics; Laterality: left,    TOTAL KNEE ARTHROPLASTY     both knees     FAMILY HISTORY   Family History  Problem Relation Age of Onset   Breast cancer Mother 32   Diabetes Mother    Stroke Mother    Stroke Brother    Breast cancer Cousin        pat cousin     SOCIAL HISTORY   Social History   Tobacco Use   Smoking status: Former    Packs/day: 0.25    Years: 10.00    Pack years: 2.50    Types: Cigarettes    Quit date: 08/18/1990    Years since quitting: 30.8   Smokeless tobacco: Never  Vaping Use   Vaping Use: Never used  Substance Use Topics   Alcohol use: No   Drug use: No     MEDICATIONS    Home Medication:    Current Medication:  Current Facility-Administered Medications:    0.9 %  sodium chloride infusion, 250 mL, Intravenous, PRN, Posey Pronto, Ekta V, MD   0.9 %  sodium chloride infusion, 250 mL, Intravenous, Continuous, Rust-Chester, Toribio Harbour L, NP   acetaminophen (TYLENOL) tablet 650 mg, 650 mg, Oral, Q6H PRN, 650 mg at 06/14/21 1844 **OR** acetaminophen (TYLENOL) suppository 650 mg, 650 mg, Rectal, Q6H PRN, Para Skeans, MD   ALPRAZolam Duanne Moron) tablet 0.25 mg, 0.25 mg, Oral, TID PRN, Leslye Peer, Richard, MD, 0.25 mg at 06/15/21 1052   azithromycin (ZITHROMAX) tablet 250 mg, 250 mg, Oral, Daily, Wieting, Richard, MD, 250 mg at 06/14/21 1846   budesonide  (PULMICORT) nebulizer solution 0.25 mg, 0.25 mg, Nebulization, BID, Rust-Chester, Britton L, NP, 0.25 mg at 06/15/21 0741   Chlorhexidine Gluconate Cloth 2 % PADS 6 each, 6 each, Topical, Daily, Rust-Chester, Britton L, NP, 6 each at 06/15/21 1001   dextromethorphan-guaiFENesin (MUCINEX DM) 30-600 MG per 12 hr tablet 1 tablet, 1 tablet, Oral, BID, Rust-Chester, Britton L, NP, 1 tablet at 06/15/21 1000   dextrose 50 % solution 50 mL, 1 ampule, Intravenous, PRN, Tamala Julian, Rondell A, MD   digoxin (LANOXIN) tablet 0.125 mg, 0.125 mg, Oral, Daily, Arida, Muhammad A, MD, 0.125 mg at 06/15/21 1001   empagliflozin (JARDIANCE) tablet 10 mg, 10 mg, Oral, Daily, Loletha Grayer, MD,  10 mg at 06/15/21 1001   enoxaparin (LOVENOX) injection 40 mg, 40 mg, Subcutaneous, Q24H, Wynelle Cleveland, RPH, 40 mg at 06/14/21 2255   feeding supplement (GLUCERNA SHAKE) (GLUCERNA SHAKE) liquid 237 mL, 237 mL, Oral, BID BM, Leslye Peer, Richard, MD, 237 mL at 06/15/21 1528   furosemide (LASIX) tablet 20 mg, 20 mg, Oral, Daily, Wieting, Richard, MD, 20 mg at 06/15/21 1001   hydrALAZINE (APRESOLINE) injection 5 mg, 5 mg, Intravenous, Q4H PRN, Para Skeans, MD   insulin aspart (novoLOG) injection 0-9 Units, 0-9 Units, Subcutaneous, TID WC, Smith, Rondell A, MD, 2 Units at 06/15/21 1300   insulin aspart (novoLOG) injection 3 Units, 3 Units, Subcutaneous, TID WC, Wieting, Richard, MD, 3 Units at 06/15/21 1300   ipratropium-albuterol (DUONEB) 0.5-2.5 (3) MG/3ML nebulizer solution 3 mL, 3 mL, Nebulization, TID, Schertz, Michele Mcalpine, MD, 3 mL at 06/15/21 1317   ipratropium-albuterol (DUONEB) 0.5-2.5 (3) MG/3ML nebulizer solution 3 mL, 3 mL, Nebulization, Q4H PRN, Schertz, Michele Mcalpine, MD   lidocaine (LIDODERM) 5 % 1 patch, 1 patch, Transdermal, Q24H, Wieting, Richard, MD, 1 patch at 06/15/21 1001   MEDLINE mouth rinse, 15 mL, Mouth Rinse, BID, Rust-Chester, Britton L, NP, 15 mL at 06/15/21 1002   metoprolol succinate (TOPROL-XL) 24 hr tablet  12.5 mg, 12.5 mg, Oral, Daily, Wieting, Richard, MD, 12.5 mg at 06/15/21 1000   metoprolol tartrate (LOPRESSOR) injection 5 mg, 5 mg, Intravenous, PRN, Benita Gutter, RPH   mirtazapine (REMERON) tablet 15 mg, 15 mg, Oral, QHS, Annita Brod, MD   multivitamin with minerals tablet 1 tablet, 1 tablet, Oral, Daily, Wieting, Richard, MD, 1 tablet at 06/15/21 1000   pantoprazole (PROTONIX) EC tablet 40 mg, 40 mg, Oral, Daily, Wynelle Cleveland, RPH   [START ON 06/16/2021] predniSONE (DELTASONE) tablet 40 mg, 40 mg, Oral, Q breakfast, Lanney Gins, Eleftherios Dudenhoeffer, MD   protein supplement (ENSURE MAX) liquid, 11 oz, Oral, QHS, Wieting, Richard, MD, 11 oz at 06/14/21 2255   sertraline (ZOLOFT) tablet 25 mg, 25 mg, Oral, Daily, [place] Pink, Sendil K, MD   sodium chloride flush (NS) 0.9 % injection 3 mL, 3 mL, Intravenous, Q12H, Florina Ou V, MD, 3 mL at 06/15/21 1002   sodium chloride flush (NS) 0.9 % injection 3 mL, 3 mL, Intravenous, PRN, Para Skeans, MD    ALLERGIES   Morphine and related     REVIEW OF SYSTEMS    Review of Systems:  Gen:  Denies  fever, sweats, chills weigh loss  HEENT: Denies blurred vision, double vision, ear pain, eye pain, hearing loss, nose bleeds, sore throat Cardiac:  No dizziness, chest pain or heaviness, chest tightness,edema Resp:   Denies cough or sputum porduction, shortness of breath,wheezing, hemoptysis,  Gi: Denies swallowing difficulty, stomach pain, nausea or vomiting, diarrhea, constipation, bowel incontinence Gu:  Denies bladder incontinence, burning urine Ext:   Denies Joint pain, stiffness or swelling Skin: Denies  skin rash, easy bruising or bleeding or hives Endoc:  Denies polyuria, polydipsia , polyphagia or weight change Psych:   Denies depression, insomnia or hallucinations   Other:  All other systems negative   VS: BP 102/72    Pulse 78    Temp 98 F (36.7 C) (Oral)    Resp 19    Ht 5\' 5"  (1.651 m)    Wt 61.3 kg    SpO2 98%    BMI 22.47 kg/m       PHYSICAL EXAM    GENERAL:NAD, no fevers, chills, no weakness no  fatigue HEAD: Normocephalic, atraumatic.  EYES: Pupils equal, round, reactive to light. Extraocular muscles intact. No scleral icterus.  MOUTH: Moist mucosal membrane. Dentition intact. No abscess noted.  EAR, NOSE, THROAT: Clear without exudates. No external lesions.  NECK: Supple. No thyromegaly. No nodules. No JVD.  PULMONARY: crackles at bases bilaterally  CARDIOVASCULAR: S1 and S2. Regular rate and rhythm. No murmurs, rubs, or gallops. No edema. Pedal pulses 2+ bilaterally.  GASTROINTESTINAL: Soft, nontender, nondistended. No masses. Positive bowel sounds. No hepatosplenomegaly.  MUSCULOSKELETAL: No swelling, clubbing, or edema. Range of motion full in all extremities.  NEUROLOGIC: Cranial nerves II through XII are intact. No gross focal neurological deficits. Sensation intact. Reflexes intact.  SKIN: No ulceration, lesions, rashes, or cyanosis. Skin warm and dry. Turgor intact.  PSYCHIATRIC: Mood, affect within normal limits. The patient is awake, alert and oriented x 3. Insight, judgment intact.       IMAGING    CT Angio Chest PE W and/or Wo Contrast  Result Date: 06/10/2021 CLINICAL DATA:  Increasing shortness of breath, increasing oxygen requirement, history of pulmonary fibrosis EXAM: CT ANGIOGRAPHY CHEST WITH CONTRAST TECHNIQUE: Multidetector CT imaging of the chest was performed using the standard protocol during bolus administration of intravenous contrast. Multiplanar CT image reconstructions and MIPs were obtained to evaluate the vascular anatomy. RADIATION DOSE REDUCTION: This exam was performed according to the departmental dose-optimization program which includes automated exposure control, adjustment of the mA and/or kV according to patient size and/or use of iterative reconstruction technique. CONTRAST:  43mL OMNIPAQUE IOHEXOL 350 MG/ML SOLN COMPARISON:  06/10/2021, 01/12/2021 FINDINGS: Cardiovascular:  This is a technically adequate evaluation of the pulmonary vasculature. No filling defects or pulmonary emboli. Stable cardiomegaly without pericardial effusion. Normal caliber of the thoracic aorta. Stable atherosclerosis of the aortic arch and coronary vasculature. Mediastinum/Nodes: Decreased mediastinal adenopathy, measuring up to 1.9 cm in the subcarinal station and 1.4 cm in the precarinal region. Thyroid, trachea, and esophagus are unremarkable. Moderate hiatal hernia. Lungs/Pleura: Basilar predominant scarring and fibrosis again noted, with continued bibasilar bronchiectasis. There are areas of increased interlobular septal thickening and ground-glass airspace disease within the mid and upper lung zones, and superimposed edema cannot be excluded. No effusion or pneumothorax. Central airways are patent. Upper Abdomen: No acute abnormality. Musculoskeletal: No acute or destructive bony lesions. Reconstructed images demonstrate no additional findings. Review of the MIP images confirms the above findings. IMPRESSION: 1. No evidence of pulmonary embolus. 2. 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 3. Increased interlobular septal thickening and ground-glass opacities within the mid and upper lung zones, which could reflect superimposed edema. 4.  Aortic Atherosclerosis (ICD10-I70.0). Electronically Signed   By: Randa Ngo M.D.   On: 06/10/2021 19:14   DG Chest Port 1 View  Result Date: 06/10/2021 CLINICAL DATA:  Questionable sepsis, evaluate for abnormality. Increasing shortness of breath and oxygen requirement. History of pulmonary fibrosis. EXAM: PORTABLE CHEST 1 VIEW COMPARISON:  Most recent radiograph 05/09/2021, chest CT 01/12/2021 FINDINGS: Low lung volumes persist. Sequela of chronic interstitial lung disease with reticulation and basilar honeycombing. This is stable in appearance from prior exam. Similar cardiomegaly with unchanged mediastinal contours. No  superimposed airspace disease, large pleural effusion or pneumothorax. Grossly stable osseous structures. IMPRESSION: Chronic interstitial lung disease, similar in appearance to prior. No evidence of superimposed acute process. Electronically Signed   By: Keith Rake M.D.   On: 06/10/2021 17:12   ECHOCARDIOGRAM COMPLETE  Result Date: 06/11/2021    ECHOCARDIOGRAM REPORT   Patient Name:  Sierra Guzman Date of Exam: 06/11/2021 Medical Rec #:  [id number]    Height:       65.0 in Accession #:    [id number]   Weight:       139.8 lb Date of Birth:  July 06, 1940    BSA:          1.699 m Patient Age:    78 years     BP:           89/57 mmHg Patient Gender: F            HR:           106 bpm. Exam Location:  [facility] Procedure: 2D Echo, Cardiac Doppler and Color Doppler Indications:     CHF-Acute Systolic [id number] / [id number]  History:         Patient has prior history of Echocardiogram examinations. CAD;                  Signs/Symptoms:Shortness of Breath and Syncope.  Sonographer:     Alyse Low Roar Referring Phys:  [place] Diagnosing Phys: Ida Rogue MD IMPRESSIONS  1. Left ventricular ejection fraction, by estimation, is 30 to 35%. The left ventricle has moderately decreased function. The left ventricle demonstrates global hypokinesis. The left ventricular internal cavity size was mildly dilated. Left ventricular diastolic parameters are consistent with Grade I diastolic dysfunction (impaired relaxation).  2. Right ventricular systolic function is mildly reduced. The right ventricular size is mildly enlarged. Mildly increased right ventricular wall thickness. There is moderately elevated pulmonary artery systolic pressure. The estimated right ventricular systolic pressure is 123456 mmHg.  3. The mitral valve is normal in structure. Mild mitral valve regurgitation. No evidence of mitral stenosis.  4. Tricuspid valve regurgitation is moderate.  5. The aortic valve is normal in structure. Aortic valve regurgitation is  not visualized. Aortic valve sclerosis/calcification is present, without any evidence of aortic stenosis.  6. Compared to prior study dated 12/09/2020, EF has decreased significantly. FINDINGS  Left Ventricle: Left ventricular ejection fraction, by estimation, is 30 to 35%. The left ventricle has moderately decreased function. The left ventricle demonstrates global hypokinesis. The left ventricular internal cavity size was mildly dilated. There is no left ventricular hypertrophy. Left ventricular diastolic parameters are consistent with Grade I diastolic dysfunction (impaired relaxation). Right Ventricle: The right ventricular size is mildly enlarged. Mildly increased right ventricular wall thickness. Right ventricular systolic function is mildly reduced. There is moderately elevated pulmonary artery systolic pressure. The tricuspid regurgitant velocity is 3.18 m/s, and with an assumed right atrial pressure of 5 mmHg, the estimated right ventricular systolic pressure is 123456 mmHg. Left Atrium: Left atrial size was normal in size. Right Atrium: Right atrial size was normal in size. Pericardium: There is no evidence of pericardial effusion. Mitral Valve: The mitral valve is normal in structure. Mild mitral valve regurgitation. No evidence of mitral valve stenosis. Tricuspid Valve: The tricuspid valve is normal in structure. Tricuspid valve regurgitation is moderate . No evidence of tricuspid stenosis. Aortic Valve: The aortic valve is normal in structure. Aortic valve regurgitation is not visualized. Aortic valve sclerosis/calcification is present, without any evidence of aortic stenosis. Aortic valve peak gradient measures 12.7 mmHg. Pulmonic Valve: The pulmonic valve was normal in structure. Pulmonic valve regurgitation is mild. No evidence of pulmonic stenosis. Aorta: The aortic root is normal in size and structure. IAS/Shunts: No atrial level shunt detected by color flow Doppler.  LEFT VENTRICLE PLAX 2D LVIDd:  4.63 cm      Diastology LVIDs:         3.91 cm      LV e' medial:    4.24 cm/s LV PW:         0.84 cm      LV E/e' medial:  13.6 LV IVS:        0.95 cm      LV e' lateral:   7.29 cm/s LVOT diam:     2.10 cm      LV E/e' lateral: 7.9 LVOT Area:     3.46 cm  LV Volumes (MOD) LV vol d, MOD A2C: 104.0 ml LV vol d, MOD A4C: 104.0 ml LV vol s, MOD A2C: 64.3 ml LV vol s, MOD A4C: 68.3 ml LV SV MOD A2C:     39.7 ml LV SV MOD A4C:     104.0 ml LV SV MOD BP:      37.2 ml RIGHT VENTRICLE RV Basal diam:  3.24 cm RV Mid diam:    2.59 cm RV S prime:     13.90 cm/s TAPSE (M-mode): 1.9 cm LEFT ATRIUM             Index        RIGHT ATRIUM           Index LA diam:        4.50 cm 2.65 cm/m   RA Area:     14.90 cm LA Vol (A2C):   56.2 ml 33.08 ml/m  RA Volume:   37.60 ml  22.13 ml/m LA Vol (A4C):   51.1 ml 30.08 ml/m LA Biplane Vol: 56.4 ml 33.20 ml/m  AORTIC VALVE                 PULMONIC VALVE AV Area (Vmax): 1.48 cm     PV Vmax:          0.91 m/s AV Vmax:        178.00 cm/s  PV Peak grad:     3.3 mmHg AV Peak Grad:   12.7 mmHg    PR End Diast Vel: 15.84 msec LVOT Vmax:      75.90 cm/s   RVOT Peak grad:   1 mmHg  AORTA Ao Root diam: 2.50 cm Ao Asc diam:  2.60 cm MITRAL VALVE               TRICUSPID VALVE MV Area (PHT): 5.50 cm    TR Peak grad:   40.4 mmHg MV Decel Time: 138 msec    TR Vmax:        318.00 cm/s MV E velocity: 57.50 cm/s MV A velocity: 93.00 cm/s  SHUNTS MV E/A ratio:  0.62        Systemic Diam: 2.10 cm MV A Prime:    11.6 cm/s Ida Rogue MD Electronically signed by Ida Rogue MD Signature Date/Time: 06/11/2021/4:42:08 PM    Final     Ref Range & Units 1 yr ago  Anti-JO 1 Ab <20 Units <20   Anti-PL-7 Ab Negative Negative   Comment: This test was developed and its performance characteristics  determined by [facility]. It has not been cleared or  approved by the Food and Drug Administration.  Anti-PL-12 Ab Negative Negative   Comment: This test was developed and its performance characteristics   determined by [facility]. It has not been cleared or  approved by the Food and Drug Administration.  Anti-EJ Ab Negative Negative   Comment: This  test was developed and its performance characteristics  determined by [facility]. It has not been cleared or  approved by the Food and Drug Administration.  Anti-OJ Ab Negative Negative   Comment: This test was developed and its performance characteristics  determined by [facility]. It has not been cleared or  approved by the Food and Drug Administration.  Anti-SRP Ab Negative Negative   Comment: This test was developed and its performance characteristics  determined by [facility]. It has not been cleared or  approved by the Food and Drug Administration.  Anti-Mi-2 Ab Negative Negative   Comment: This test was developed and its performance characteristics  determined by [facility]. It has not been cleared or  approved by the Food and Drug Administration.  Anti-TIF 1gamma Ab <20 Units <20   Comment: This test was developed and its performance characteristics  determined by [facility]. It has not been cleared or  approved by the Food and Drug Administration.  Anti-MDA-5 Ab (CADM-140) <20 Units <20   Comment: This test was developed and its performance characteristics  determined by [facility]. It has not been cleared or  approved by the Food and Drug Administration.  Anti-NXP-2 (P140) Ab <20 Units <20   Comment: This test was developed and its performance characteristics  determined by [facility]. It has not been cleared or  approved by the Food and Drug Administration.  ANTI-PM/SCL-100 AB <20 Units <20   Comment: This test was developed and its performance characteristics  determined by [facility]. It has not been cleared or  approved by the Food and Drug Administration.  Anti-Ku Ab Negative Negative   Comment: This test was developed and its performance characteristics  determined by [facility]. It has not been cleared or  approved by the Food and Drug  Administration.  Anti-SS-A 52kD Ab, IgG <20 Units <20   Comment: This test was developed and its performance characteristics  determined by [facility]. It has not been cleared or  approved by the Food and Drug Administration.  Anti-U1 RNP Ab <20 Units <20   Comment: This test was developed and its performance characteristics  determined by [facility]. It has not been cleared or  approved by the Food and Drug Administration.  Anti-U2 RNP Ab Negative Negative   Comment: This test was developed and its performance characteristics  determined by [facility]. It has not been cleared or  approved by the Food and Drug Administration.  Anti-U3 RNP (Fibrillarin) Negative Negative   Comment: This test was developed and its performance characteristics  determined by [facility]. It has not been cleared or  approved by the Food and Drug Administration.      Interpretation for Anti-Jo-1, Anti-TIF-1gamma,      Anti-MDA-5, Anti-NXP-2, Anti-PM/Scl-100,      Anti-SS-A 52 kD, Anti-U1 RNP:          Negative:                                <20          Weak Positive:                       20 - 39          Moderate Positive:                   40 - 80          Strong Positive:                         >  80                                                        .        ASSESSMENT/PLAN   Acute on chronic hypoxemic respiratory failure     - due to exacerbation of ILD - present on admission      - patient is improved s/p steroids and IVF      - patient reporting severe anxiety disorder   -autoimmune workup is negative from previous lab review     -IV solumedrol 40 bid >>40 daily      - Due to DM with hyperglycemia have placed Diabetic coordinator consultation   Acute on chronic systolic CHF   - patient is currently without chest pain and is able to speak in full sentences    - caution with fluid balance    -EF has reduced substantially in past 1 year per cardiology - poor prognosis  Acute on chronic renal  failure     - s/p diuresis      - dc non essential nephrotoxins    Moderate protein calorie malnutrition     - bitermporal wasting with hypolabuminemia      Thank you for allowing me to participate in the care of this patient.    Patient/Family are satisfied with care plan and all questions have been answered.  This document was prepared using Dragon voice recognition software and may include unintentional dictation errors.     Ottie Glazier, M.D.  Division of [facility]

## 2021-06-15 NOTE — TOC Progression Note (Signed)
Transition of Care (TOC) - Progression Note  ? ? ?Patient Details  ?Name: Sierra Guzman ?MRN: [id number] ?Date of Birth: 01-12-1941 ? ?Transition of Care (TOC) CM/SW Contact  ?Candie Chroman, LCSW ?Phone Number: ?06/15/2021, 3:44 PM ? ?Clinical Narrative:   Met with patient and provided list of home hospice agencies. She called her daughter and put her on speaker. She is leaning toward [facility] (now called [place]) but wants to discuss with the family. Also asked her to discuss what DME she may need. Will follow up tomorrow. ? ?Expected Discharge Plan: [place] ?Barriers to Discharge: Continued Medical Work up ? ?Expected Discharge Plan and Services ?Expected Discharge Plan: [place] ?  ?  ?Post Acute Care Choice: Resumption of Svcs/PTA Provider ?Living arrangements for the past 2 months: [place] ?                ?  ?  ?  ?  ?  ?HH Arranged: RN, PT, OT, Nurse's Aide, Social Work ?[place] Agency: [place] ?Date HH Agency Contacted: 06/13/21 ?  ?Representative spoke with at [place]: [place] [employer] ? ? ?Social Determinants of Health (SDOH) Interventions ?  ? ?Readmission Risk Interventions ?Readmission Risk Prevention Plan 06/13/2021 03/09/2021  ?Transportation Screening Complete Complete  ?PCP or Specialist Appt within 3-5 Days - Complete  ?[place] or Home Care Consult - Complete  ?Social Work Consult for [place] Planning/Counseling - Complete  ?Palliative Care Screening - Not Applicable  ?Medication Review Press photographer) Complete Complete  ?PCP or Specialist appointment within 3-5 days of discharge Complete -  ?[place] or Home Care Consult Complete -  ?SW Recovery Care/Counseling Consult Complete -  ?Palliative Care Screening Not Applicable -  ?[place] Patient Refused -  ?Some recent data might be hidden  ? ? ?

## 2021-06-15 NOTE — Progress Notes (Signed)
[facility] Progress Note ? ?Patient: Sierra Guzman    HKV:[id number]  DOA: 06/10/2021    ?Date of Service: the patient was seen and examined on 06/15/2021 ? ?Brief hospital course: ?81 year old female with past medical history of idiopathic pulmonary fibrosis and chronic respiratory failure (2 L at rest and 4 L with ambulation), CAD, CKD stage III, acute on chronic systolic congestive heart failure presented with acute hypoxic respiratory failure requiring BiPAP on presentation. ? ?Hospital course difficult due to attempting to treat acute systolic congestive heart failure with IV Lasix and then switched over to oral torsemide, but limited with medication secondary to hypotension.  Patient on low-dose Toprol but still tachycardic so digoxin was added by cardiology.  Cardiology wanted to do medical management.  Ejection fraction lower on this echocardiogram 30 to 35%.  Palliative care consulted and met with family on 3/7.  Consideration for further disposition to be determined.  In follow-up, family would like to take patient home with hospice treating basic medical issues with no extraordinary measures. ? ?Assessment and Plan: ?Assessment and Plan: ?* Acute on chronic respiratory failure with hypoxemia (HCC) ?Acute on chronic respiratory failure with hypoxemia.  The patient had respiratory distress on presentation requiring BiPAP.  Initial pulse ox 85% on oxygen..  The patient states that she wears 2 L oxygen at rest and 4 L with ambulation.  Patient was on 4 L of oxygen this morning.  ? ? ?Acute on chronic combined systolic and diastolic CHF (congestive heart failure) ([place]) ?This echo showing EF of 30 to 35%.  Continue Toprol-XL low-dose.  With heart rates elevated cardiology added low-dose digoxin.  Heart rate still bouncing around up as high as 150.  Jardiance also added.  Conservative management as per cardiology. Limited with medication secondary to hypotension.  Restarted on p.o. Lasix ? ?Interstitial lung  disease ([place]) ?Sepsis ruled out.  I think this is interstitial lung disease exacerbation also.  Pulmonary changed prednisone to Solu-Medrol. ? ?Chronic kidney disease, stage 3a ([place]) ?At baseline.  Creatinine today at 1.01 with GFR of 56. ? ?Uncontrolled type 2 diabetes mellitus with hyperglycemia, without long-term current use of insulin ([place]) ?Started Jardiance.  Continue sliding scale insulin and NovoLog while on steroids.  A1c of 5.9. ? ?Hypotension ?Patient tolerating low-dose Toprol. ? ?Anemia of chronic disease ?Anemia of chronic disease with last ferritin being elevated.  Last hemoglobin 10.4 ? ? ?Hypomagnesemia ?Replaced during the hospital course. ? ?Pressure injury of skin ?Stage II on sacrum and coccyx.  See full description below, present on admission. ? ?Coronary artery disease ?Borderline troponins likely demand ischemia with acute hypoxic respiratory failure ? ?Malnutrition of moderate degree ?Nutrition Status: ?Nutrition Problem: Moderate Malnutrition ?Etiology: chronic illness (CHF) ?Signs/Symptoms: mild fat depletion, moderate fat depletion, mild muscle depletion, moderate muscle depletion, energy intake < or equal to 75% for > or equal to 1 month ?Interventions: Glucerna shake, Premier Protein, MVI ? ? ? ? ?Elevated liver function tests ?Likely secondary to CHF.  AST up to 219 and ALT up to 181.  Total bilirubin 0.6. ? ? ? ? ? ? ?Body mass index is 22.47 kg/m?Marland Kitchen  ?Nutrition Problem: Moderate Malnutrition ?Etiology: chronic illness (CHF) ?Pressure Injury 01/12/21 Sacrum Right Stage 2 -  Partial thickness loss of dermis presenting as a shallow open injury with a red, pink wound bed without slough. (Active)  ?01/12/21 0900  ?Location: Sacrum  ?Location Orientation: Right  ?Staging: Stage 2 -  Partial thickness loss of dermis presenting as a  shallow open injury with a red, pink wound bed without slough.  ?Wound Description (Comments):   ?Present on Admission: Yes  ?   ?Pressure Injury 06/10/21 Coccyx  Mid Stage 2 -  Partial thickness loss of dermis presenting as a shallow open injury with a red, pink wound bed without slough. (Active)  ?06/10/21 2250  ?Location: Coccyx  ?Location Orientation: Mid  ?Staging: Stage 2 -  Partial thickness loss of dermis presenting as a shallow open injury with a red, pink wound bed without slough.  ?Wound Description (Comments):   ?Present on Admission: Yes  ?  ? ?Consultants: ?Pulmonary ?Cardiology ?Palliative care ? ?Procedures: ?Echocardiogram: Ejection fraction 30 to 35% ? ?Antimicrobials: ?Antibiotics Given (last 72 hours)   ? ? Date/Time Action Medication Dose  ? 06/12/21 1710 Given  ? azithromycin (ZITHROMAX) tablet 250 mg 250 mg  ? 06/13/21 1706 Given  ? azithromycin (ZITHROMAX) tablet 250 mg 250 mg  ? 06/14/21 1846 Given  ? azithromycin (ZITHROMAX) tablet 250 mg 250 mg  ? ?  ? ? ? ?Code Status: Full code ? ? ?Subjective: Breathing okay ? ?Objective: ?Vital signs were reviewed and unremarkable. ?Vitals:  ? 06/15/21 1324 06/15/21 1531  ?BP:  102/72  ?Pulse:  78  ?Resp:    ?Temp:  98 ?F (36.7 ?C)  ?SpO2: 99% 98%  ? ? ?Intake/Output Summary (Last 24 hours) at 06/15/2021 1538 ?Last data filed at 06/15/2021 1358 ?Gross per 24 hour  ?Intake 240 ml  ?Output 450 ml  ?Net -210 ml  ? ?Filed Weights  ? 06/13/21 0500 06/14/21 0400 06/15/21 0422  ?Weight: 62.9 kg 62.9 kg 61.3 kg  ? ?Body mass index is 22.47 kg/m?. ? ?Exam: ? ?General: Alert and oriented x2, no acute distress ?HEENT: Normocephalic atraumatic, mucous membranes are moist ?Cardiovascular: Regular rate and rhythm, S1-S2, 2 out of 6 systolic ejection murmur ?Respiratory: Decreased breath sounds throughout ?Abdomen: Soft, nontender, nondistended, positive bowel sounds ?Musculoskeletal: No clubbing or cyanosis, trace pitting edema ?Skin: No skin breaks, tears or lesions ?Psychiatry: Appropriate, no evidence of psychoses ?Neurology: No focal deficits ? ?Data Reviewed: ?Slight increase in creatinine to 1.01 with a GFR of 56.   Hemoglobin stable at 10.2 ? ?Disposition:  ?Status is: Inpatient ?Remains inpatient appropriate because: Finalization of disposition.  Anticipate discharge with home hospice tomorrow ?  ? ?Family Communication: Left message for daughter ?DVT Prophylaxis: ?enoxaparin (LOVENOX) injection 40 mg Start: 06/13/21 2200 ? ? ? ?Author: ?Annita Brod ,MD ?06/15/2021 3:38 PM ? ?To reach On-call, see care teams to locate the attending and reach out via [email or web address]. ?Between 7PM-7AM, please contact night-coverage ?If you still have difficulty reaching the attending provider, please page the [facility] (Director on Call) for [facility] on amion for assistance. ? ?

## 2021-06-15 NOTE — Progress Notes (Signed)
? ?Progress Note ? ?Patient Name: Sierra Guzman ?Date of Encounter: 06/15/2021 ? ?[facility] Cardiologist: Janina Mayo, MD  ? ?Subjective  ? ?Just came back from working with PT, still with shortness of breath, no edema.  Heart rate a little bit elevated while working with PT. met with palliative care yesterday, home hospice being considered. ? ?Inpatient Medications  ?  ?Scheduled Meds: ? azithromycin  250 mg Oral Daily  ? budesonide (PULMICORT) nebulizer solution  0.25 mg Nebulization BID  ? Chlorhexidine Gluconate Cloth  6 each Topical Daily  ? dextromethorphan-guaiFENesin  1 tablet Oral BID  ? digoxin  0.125 mg Oral Daily  ? empagliflozin  10 mg Oral Daily  ? enoxaparin (LOVENOX) injection  40 mg Subcutaneous Q24H  ? feeding supplement (GLUCERNA SHAKE)  237 mL Oral BID BM  ? furosemide  20 mg Oral Daily  ? insulin aspart  0-9 Units Subcutaneous TID WC  ? insulin aspart  3 Units Subcutaneous TID WC  ? ipratropium-albuterol  3 mL Nebulization TID  ? lidocaine  1 patch Transdermal Q24H  ? mouth rinse  15 mL Mouth Rinse BID  ? metoprolol succinate  12.5 mg Oral Daily  ? mirtazapine  15 mg Oral QHS  ? multivitamin with minerals  1 tablet Oral Daily  ? pantoprazole  40 mg Oral Daily  ? [START ON 06/16/2021] predniSONE  40 mg Oral Q breakfast  ? Ensure Max Protein  11 oz Oral QHS  ? sertraline  25 mg Oral Daily  ? sodium chloride flush  3 mL Intravenous Q12H  ? ?Continuous Infusions: ? sodium chloride    ? sodium chloride    ? ?PRN Meds: ?sodium chloride, acetaminophen **OR** acetaminophen, ALPRAZolam, dextrose, hydrALAZINE, ipratropium-albuterol, metoprolol tartrate, sodium chloride flush  ? ?Vital Signs  ?  ?Vitals:  ? 06/15/21 0744 06/15/21 1119 06/15/21 1324 06/15/21 1531  ?BP:  102/68  102/72  ?Pulse:  83  78  ?Resp:      ?Temp:  97.9 ?F (36.6 ?C)  98 ?F (36.7 ?C)  ?TempSrc:  Oral  Oral  ?SpO2: 99% 100% 99% 98%  ?Weight:      ?Height:      ? ? ?Intake/Output Summary (Last 24 hours) at 06/15/2021 1718 ?Last data  filed at 06/15/2021 1358 ?Gross per 24 hour  ?Intake 240 ml  ?Output 450 ml  ?Net -210 ml  ? ?Last 3 Weights 06/15/2021 06/14/2021 06/13/2021  ?Weight (lbs) 135 lb 0.9 oz 138 lb 9.7 oz 138 lb 10.7 oz  ?Weight (kg) 61.262 kg 62.872 kg 62.9 kg  ?   ? ?Telemetry  ?  ?Atrial tachycardia, heart rate 118- Personally Reviewed ? ?ECG  ?  ? - Personally Reviewed ? ?Physical Exam  ? ?GEN: Mild respiratory distress ?Neck: No JVD ?Cardiac: Tachycardic, regular ?Respiratory: Diminished breath sounds bilaterally ?GI: Soft, nontender, non-distended  ?MS: No edema; No deformity. ?Neuro:  Nonfocal  ?Psych: Normal affect  ? ?Labs  ?  ?High Sensitivity Troponin:   ?Recent Labs  ?Lab 06/10/21 ?2011 06/10/21 ?2222  ?TROPONINIHS 67* 61*  ?   ?Chemistry ?Recent Labs  ?Lab 06/10/21 ?2011 06/11/21 ?3235 06/11/21 ?1210 06/12/21 ?5732 06/13/21 ?2025 06/14/21 ?4270 06/15/21 ?0532  ?NA  --  144   < > 140 139 141 137  ?K  --  3.9   < > 4.1 4.1 4.3 4.6  ?CL  --  104   < > 99 92* 91* 92*  ?CO2  --  33*   < >  33* 36* 40* 36*  ?GLUCOSE  --  138*   < > 117* 264* 161* 151*  ?BUN  --  23   < > 29* 33* 42* 39*  ?CREATININE 0.91 0.81   < > 1.03* 1.00 0.84 1.01*  ?CALCIUM  --  9.4   < > 9.5 9.5 9.6 10.0  ?MG 1.6* 3.7*  --   --   --   --   --   ?PROT  --  6.4*  --  6.3*  --  6.0* 6.3*  ?ALBUMIN  --  3.1*  --  3.0*  --  2.9* 3.2*  ?AST  --  94*  --  155*  --  219* 90*  ?ALT  --  74*  --  91*  --  181* 128*  ?ALKPHOS  --  57  --  57  --  75 73  ?BILITOT  --  0.7  --  0.4  --  0.6 0.7  ?GFRNONAA >60 >60   < > 55* 57* >60 56*  ?ANIONGAP  --  7   < > '8 11 10 9  ' ? < > = values in this interval not displayed.  ?  ?Lipids No results for input(s): CHOL, TRIG, HDL, LABVLDL, LDLCALC, CHOLHDL in the last 168 hours.  ?Hematology ?Recent Labs  ?Lab 06/11/21 ?6962 06/12/21 ?9528 06/13/21 ?4132 06/15/21 ?0532  ?WBC 12.0* 13.8*  --  9.0  ?RBC 3.06* 2.94*  --  3.30*  ?HGB 9.5* 9.2* 10.4* 10.2*  ?HCT 31.1* 29.6*  --  33.4*  ?MCV 101.6* 100.7*  --  101.2*  ?MCH 31.0 31.3  --  30.9   ?MCHC 30.5 31.1  --  30.5  ?RDW 14.3 14.1  --  14.1  ?PLT 237 224  --  247  ? ?Thyroid  ?Recent Labs  ?Lab 06/11/21 ?4401  ?TSH 1.020  ?  ?BNP ?Recent Labs  ?Lab 06/10/21 ?2011 06/11/21 ?0272  ?BNP 563.3* 662.9*  ?  ?DDimer No results for input(s): DDIMER in the last 168 hours.  ? ?Radiology  ?  ?No results found. ? ?Cardiac Studies  ? ?TTE 06/11/2021 ?1. Left ventricular ejection fraction, by estimation, is 30 to 35%. The  ?left ventricle has moderately decreased function. The left ventricle  ?demonstrates global hypokinesis. The left ventricular internal cavity size  ?was mildly dilated. Left ventricular  ?diastolic parameters are consistent with Grade I diastolic dysfunction  ?(impaired relaxation).  ? 2. Right ventricular systolic function is mildly reduced. The right  ?ventricular size is mildly enlarged. Mildly increased right ventricular  ?wall thickness. There is moderately elevated pulmonary artery systolic  ?pressure. The estimated right ventricular  ?systolic pressure is 53.6 mmHg.  ? 3. The mitral valve is normal in structure. Mild mitral valve  ?regurgitation. No evidence of mitral stenosis.  ? 4. Tricuspid valve regurgitation is moderate.  ? 5. The aortic valve is normal in structure. Aortic valve regurgitation is  ?not visualized. Aortic valve sclerosis/calcification is present, without  ?any evidence of aortic stenosis.  ? 6. Compared to prior study dated 12/09/2020, EF has decreased  ?significantly.  ? ?Patient Profile  ?   ?81 y.o. 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 ?Assessment & Plan  ?  ?HFrEF EF 30 to 35% ?-Low blood pressures preventing use of GDMT ?-Continue Toprol-XL 12.5 mg daily, Jardiance, Lasix 20 mg  daily ? ?2.  Atrial tachycardia ?-Toprol-XL, digoxin. ? ?3.  Interstitial lung disease, chronic respiratory failure ?-Primary reason for shortness of  breath ?-Management as per pulmonary medicine ? ?4.  Deconditioning ?-Continue PT ? ?Overall prognosis remains guarded.  Home with hospice being considered.  No additional cardiac testing or intervention planned.  Continue medications as above.  Please let us know if additional input is needed. ? ?Total encounter time more than 50 minutes ? Greater than 50% was spent in counseling and coordination of care with the patient ? ?   ?Signed, ?Kate Sable, MD  ?06/15/2021, 5:18 PM    ?

## 2021-06-16 DIAGNOSIS — E440 Moderate protein-calorie malnutrition: Secondary | ICD-10-CM

## 2021-06-16 LAB — GLUCOSE, CAPILLARY
Glucose-Capillary: 154 mg/dL — ABNORMAL HIGH (ref 70–99)
Glucose-Capillary: 132 mg/dL — ABNORMAL HIGH (ref 70–99)
Glucose-Capillary: 230 mg/dL — ABNORMAL HIGH (ref 70–99)
Glucose-Capillary: 256 mg/dL — ABNORMAL HIGH (ref 70–99)
Glucose-Capillary: 338 mg/dL — ABNORMAL HIGH (ref 70–99)
Glucose-Capillary: 194 mg/dL — ABNORMAL HIGH (ref 70–99)

## 2021-06-16 LAB — BASIC METABOLIC PANEL
Anion gap: 9 (ref 5–15)
BUN: 32 mg/dL — ABNORMAL HIGH (ref 8–23)
CO2: 33 mmol/L — ABNORMAL HIGH (ref 22–32)
Calcium: 9.6 mg/dL (ref 8.9–10.3)
Chloride: 95 mmol/L — ABNORMAL LOW (ref 98–111)
Creatinine, Ser: 0.93 mg/dL (ref 0.44–1.00)
GFR, Estimated: >60 mL/min (ref >60)
Glucose, Bld: 140 mg/dL — ABNORMAL HIGH (ref 70–99)
Potassium: 4.5 mmol/L (ref 3.5–5.1)
Sodium: 137 mmol/L (ref 135–145)

## 2021-06-16 MED ORDER — GABAPENTIN 100 MG PO CAPS
100.0000 mg | ORAL_CAPSULE | Freq: Once | ORAL | Status: AC
  Administered 2021-06-16: 06:00:00 100 mg via ORAL
  Filled 2021-06-16: qty 1

## 2021-06-16 MED ORDER — MYCOPHENOLATE MOFETIL 250 MG PO CAPS
250.0000 mg | ORAL_CAPSULE | Freq: Two times a day (BID) | ORAL | Status: DC
  Administered 2021-06-16 – 2021-06-17 (×3): 250 mg via ORAL
  Filled 2021-06-16 (×4): qty 1

## 2021-06-16 MED ORDER — GABAPENTIN 100 MG PO CAPS
100.0000 mg | ORAL_CAPSULE | Freq: Three times a day (TID) | ORAL | Status: DC
  Administered 2021-06-16 – 2021-06-17 (×4): 100 mg via ORAL
  Filled 2021-06-16 (×4): qty 1

## 2021-06-16 NOTE — Progress Notes (Signed)
[facility] Progress Note ? ?Patient: Sierra Guzman    GNF:[id number]  DOA: 06/10/2021    ?Date of Service: the patient was seen and examined on 06/16/2021 ? ?Brief hospital course: ?81 year old female with past medical history of idiopathic pulmonary fibrosis and chronic respiratory failure (2 L at rest and 4 L with ambulation), CAD, CKD stage III, acute on chronic systolic congestive heart failure presented with acute hypoxic respiratory failure requiring BiPAP on presentation. ? ?Hospital course difficult due to attempting to treat acute systolic congestive heart failure with IV Lasix and then switched over to oral torsemide, but limited with medication secondary to hypotension.  Patient on low-dose Toprol but still tachycardic so digoxin was added by cardiology.  Cardiology wanted to do medical management.  Ejection fraction lower on this echocardiogram 30 to 35%.  Palliative care consulted and met with family on 3/7.  Consideration for further disposition to be determined.  In follow-up, family would like to take patient home with hospice treating basic medical issues with no extraordinary measures, on 3/10. ? ?Assessment and Plan: ?Assessment and Plan: ?* Acute on chronic respiratory failure with hypoxemia (HCC) ?Acute on chronic respiratory failure with hypoxemia.  The patient had respiratory distress on presentation requiring BiPAP.  Initial pulse ox 85% on oxygen..  The patient states that she wears 2 L oxygen at rest and 4 L with ambulation.  Patient was on 4 L of oxygen this morning.  ? ? ?Acute on chronic combined systolic and diastolic CHF (congestive heart failure) ([place]) ?This echo showing EF of 30 to 35%.  Continue Toprol-XL low-dose.  With heart rates elevated cardiology added low-dose digoxin.  Heart rate still bouncing around up as high as 150.  Jardiance also added.  Conservative management as per cardiology. Limited with medication secondary to hypotension.  Restarted on p.o.  Lasix ? ?Interstitial lung disease ([place]) ?Sepsis ruled out.  I think this is interstitial lung disease exacerbation also.  Pulmonary changed prednisone to Solu-Medrol. ? ?Chronic kidney disease, stage 3a ([place]) ?At baseline.  Creatinine today at 1.01 with GFR of 56. ? ?Uncontrolled type 2 diabetes mellitus with hyperglycemia, without long-term current use of insulin ([place]) ?Started Jardiance.  Continue sliding scale insulin and NovoLog while on steroids.  A1c of 5.9. ? ?Hypotension ?Patient tolerating low-dose Toprol. ? ?Anemia of chronic disease ?Anemia of chronic disease with last ferritin being elevated.  Last hemoglobin 10.4 ? ? ?Hypomagnesemia ?Replaced during the hospital course. ? ?Pressure injury of skin ?Stage II on sacrum and coccyx.  See full description below, present on admission. ? ?Coronary artery disease ?Borderline troponins likely demand ischemia with acute hypoxic respiratory failure ? ?Malnutrition of moderate degree ?Nutrition Status: ?Nutrition Problem: Moderate Malnutrition ?Etiology: chronic illness (CHF) ?Signs/Symptoms: mild fat depletion, moderate fat depletion, mild muscle depletion, moderate muscle depletion, energy intake < or equal to 75% for > or equal to 1 month ?Interventions: Glucerna shake, Premier Protein, MVI ? ? ? ? ?Elevated liver function tests ?Likely secondary to CHF.  AST up to 219 and ALT up to 181.  Total bilirubin 0.6. ? ? ? ? ? ? ?Body mass index is 22.42 kg/m?Marland Kitchen  ?Nutrition Problem: Moderate Malnutrition ?Etiology: chronic illness (CHF) ?Pressure Injury 01/12/21 Sacrum Right Stage 2 -  Partial thickness loss of dermis presenting as a shallow open injury with a red, pink wound bed without slough. (Active)  ?01/12/21 0900  ?Location: Sacrum  ?Location Orientation: Right  ?Staging: Stage 2 -  Partial thickness loss of  dermis presenting as a shallow open injury with a red, pink wound bed without slough.  ?Wound Description (Comments):   ?Present on Admission: Yes  ?    ?Pressure Injury 06/10/21 Coccyx Mid Stage 2 -  Partial thickness loss of dermis presenting as a shallow open injury with a red, pink wound bed without slough. (Active)  ?06/10/21 2250  ?Location: Coccyx  ?Location Orientation: Mid  ?Staging: Stage 2 -  Partial thickness loss of dermis presenting as a shallow open injury with a red, pink wound bed without slough.  ?Wound Description (Comments):   ?Present on Admission: Yes  ?  ? ?Consultants: ?Pulmonary ?Cardiology ?Palliative care ? ?Procedures: ?Echocardiogram: Ejection fraction 30 to 35% ? ?Antimicrobials: ?Antibiotics Given (last 72 hours)   ? ? Date/Time Action Medication Dose  ? 06/13/21 1706 Given  ? azithromycin (ZITHROMAX) tablet 250 mg 250 mg  ? 06/14/21 1846 Given  ? azithromycin (ZITHROMAX) tablet 250 mg 250 mg  ? 06/15/21 2134 Given  ? azithromycin (ZITHROMAX) tablet 250 mg 250 mg  ? ?  ? ? ? ?Code Status: Full code ? ? ?Subjective: Breathing a little easier today ? ?Objective: ?Vital signs were reviewed and unremarkable. ?Vitals:  ? 06/16/21 0803 06/16/21 1132  ?BP:  (!) 91/53  ?Pulse:  89  ?Resp:  20  ?Temp:  98.2 ?F (36.8 ?C)  ?SpO2: 98% 100%  ? ? ?Intake/Output Summary (Last 24 hours) at 06/16/2021 1620 ?Last data filed at 06/16/2021 1617 ?Gross per 24 hour  ?Intake --  ?Output 2000 ml  ?Net -2000 ml  ? ? ?Filed Weights  ? 06/14/21 0400 06/15/21 0422 06/16/21 0415  ?Weight: 62.9 kg 61.3 kg 61.1 kg  ? ?Body mass index is 22.42 kg/m?. ? ?Exam: ? ?General: Alert and oriented x2, no acute distress ?HEENT: Normocephalic atraumatic, mucous membranes are moist ?Cardiovascular: Regular rate and rhythm, S1-S2, 2 out of 6 systolic ejection murmur ?Respiratory: Decreased breath sounds throughout ?Abdomen: Soft, nontender, nondistended, positive bowel sounds ?Musculoskeletal: No clubbing or cyanosis, trace pitting edema ?Skin: No skin breaks, tears or lesions ?Psychiatry: Appropriate, no evidence of psychoses ?Neurology: No focal deficits ? ?Data  Reviewed: ?Creatinine improved ? ?Disposition:  ?Status is: Inpatient ?Remains inpatient appropriate because: Finalization of disposition.  Anticipate discharge with home hospice tomorrow ?  ? ?Family Communication: Left message for daughter ?DVT Prophylaxis: ?enoxaparin (LOVENOX) injection 40 mg Start: 06/13/21 2200 ? ? ? ?Author: ?Annita Brod ,MD ?06/16/2021 4:20 PM ? ?To reach On-call, see care teams to locate the attending and reach out via [email or web address]. ?Between 7PM-7AM, please contact night-coverage ?If you still have difficulty reaching the attending provider, please page the [facility] (Director on Call) for [facility] on amion for assistance. ? ?

## 2021-06-16 NOTE — Progress Notes (Signed)
Mobility Specialist - Progress Note ? ? 06/16/21 1250  ?Mobility  ?Activity Ambulated with assistance in hallway  ?$Mobility charge 1 Mobility  ? ? ? ?+2 for chair follow ? ? ?Sierra Guzman ?Mobility Specialist ?06/16/21, 12:51 PM ? ? ? ? ?

## 2021-06-16 NOTE — TOC Progression Note (Signed)
Transition of Care (TOC) - Progression Note  ? ? ?Patient Details  ?Name: Sierra Guzman ?MRN: [id number] ?Date of Birth: Mar 12, 1941 ? ?Transition of Care (TOC) CM/SW Contact  ?Candie Chroman, LCSW ?Phone Number: ?06/16/2021, 11:34 AM ? ?Clinical Narrative:  Met with patient and daughter [name] at bedside. They would like to move forward with home hospice referral to [facility] and is requesting a hospital bed and wheelchair. Referral made to [facility]. Asked MD for DME orders. Patient will need EMS transport home: [address] [place], [place] [address]. ? ?Expected Discharge Plan: [place] ?Barriers to Discharge: Continued Medical Work up ? ?Expected Discharge Plan and Services ?Expected Discharge Plan: [place] ?  ?  ?Post Acute Care Choice: Resumption of Svcs/PTA Provider ?Living arrangements for the past 2 months: [place] ?                ?  ?  ?  ?  ?  ?HH Arranged: RN, PT, OT, Nurse's Aide, Social Work ?[place] Agency: [place] ?Date HH Agency Contacted: 06/13/21 ?  ?Representative spoke with at [place]: [place] [employer] ? ? ?Social Determinants of Health (SDOH) Interventions ?  ? ?Readmission Risk Interventions ?Readmission Risk Prevention Plan 06/13/2021 03/09/2021  ?Transportation Screening Complete Complete  ?PCP or Specialist Appt within 3-5 Days - Complete  ?[place] or Home Care Consult - Complete  ?Social Work Consult for [place] Planning/Counseling - Complete  ?Palliative Care Screening - Not Applicable  ?Medication Review Press photographer) Complete Complete  ?PCP or Specialist appointment within 3-5 days of discharge Complete -  ?[place] or Home Care Consult Complete -  ?SW Recovery Care/Counseling Consult Complete -  ?Palliative Care Screening Not Applicable -  ?[place] Patient Refused -  ?Some recent data might be hidden  ? ? ?

## 2021-06-16 NOTE — Progress Notes (Addendum)
Physical Therapy Treatment ?Patient Details ?Name: Sierra Guzman ?MRN: [id number] ?DOB: 01-Jun-1940 ?Today's Date: 06/16/2021 ? ? ?History of Present Illness Pt is an 81 y/o F admitted on 06/10/21 after presenting with c/c of SOB & noted to have low BP & tachycardia. Pt met sepsis criteria but this was later ruled out. Chest x-ray does show PNA. 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 PMH: anemia, chronic diastolic CHF, DM2, anxiety, asthma, CKD stage 3, lumbar DDD, CAD, gout, HLD, idiopathic pulmonary fibrosis, obesity, syncope ? ?  ?   ?Recommendations for follow up therapy are one component of a multi-disciplinary discharge planning process, led by the attending physician.  Recommendations may be updated based on patient status, additional functional criteria and insurance authorization. ? ?Follow Up Recommendations ? Home health PT ?  ?  ?Assistance Recommended at Discharge Intermittent Supervision/Assistance  ?Patient can return home with the following A little help with walking and/or transfers;A little help with bathing/dressing/bathroom;Assistance with cooking/housework;Assist for transportation;Help with stairs or ramp for entrance ?  ?Equipment Recommendations ? None recommended by PT  ?  ?   ?Precautions / Restrictions Precautions ?Precautions: Fall ?Precaution Comments: closely watch HR/O2 ?Restrictions ?Weight Bearing Restrictions: No  ?  ? ?Mobility ? Bed Mobility ?Overal bed mobility: Modified Independent ?Bed Mobility: Supine to Sit, Sit to Supine ?  ?  ?Supine to sit: HOB elevated, Min assist ?  ?  ?General bed mobility comments: pt did require min assist to exit R side of bed with increased time. vcs for step by step sequencing. ?  ? ?Transfers ?Overall transfer level: Needs assistance ?Equipment used: Rolling walker (2 wheels) ?Transfers: Sit to/from Stand ?Sit to Stand: Min guard, Supervision ?  ?  ?  ?  ?  ?General  transfer comment: Pt was able to easily able to stand from EOB/recliner > RW ?  ? ?Ambulation/Gait ?Ambulation/Gait assistance: Min guard ?Gait Distance (Feet): 25 Feet ?Assistive device: Rolling walker (2 wheels) ?Gait Pattern/deviations: Step-through pattern ?Gait velocity: decreased ?  ?  ?General Gait Details: pt was able to ambulate 4 x 25 ft with RW with prolonged recovery time between trials. ? ?  ?Balance Overall balance assessment: Needs assistance ?Sitting-balance support: Feet supported, Bilateral upper extremity supported, No upper extremity supported, Single extremity supported ?Sitting balance-Leahy Scale: Fair ?  ?  ?Standing balance support: Bilateral upper extremity supported, During functional activity ?Standing balance-Leahy Scale: Good ?  ?   ?Cognition Arousal/Alertness: Awake/alert ?Behavior During Therapy: [facility] for tasks assessed/performed (less anxious however still slightly anxious at times) ?Overall Cognitive Status: Within Functional Limits for tasks assessed ?  ?   ?  ?General Comments: Pt is A and O and cooperative and motivated. ?  ?  ? ?  ?   ?   ? ?Pertinent Vitals/Pain Pain Assessment ?Pain Assessment: No/denies pain ?Pain Location: buttocks from sitting ?Pain Descriptors / Indicators: Discomfort ?Pain Intervention(s): Limited activity within patient's tolerance, Monitored during session, Premedicated before session, Repositioned  ? ? ? ?PT Goals (current goals can now be found in the care plan section) Acute Rehab PT Goals ?Patient Stated Goal: get better, go home with hospice helping ?Progress towards PT goals: Progressing toward goals ? ?  ?Frequency ? ? ? Min 2X/week ? ? ? ?  ?PT Plan Current plan remains appropriate  ? ? ?   ?AM-PAC PT "6 Clicks" Mobility   ?Outcome Measure ? Help needed turning  from your back to your side while in a flat bed without using bedrails?: None ?Help needed moving from lying on your back to sitting on the side of a flat bed without using bedrails?: A  Little ?Help needed moving to and from a bed to a chair (including a wheelchair)?: A Little ?Help needed standing up from a chair using your arms (e.g., wheelchair or bedside chair)?: A Little ?Help needed to walk in hospital room?: A Little ?Help needed climbing 3-5 steps with a railing? : A Little ?6 Click Score: 19 ? ?  ?End of Session Equipment Utilized During Treatment: Oxygen ?Activity Tolerance: Patient limited by fatigue ?Patient left: in chair;with call bell/phone within reach;with chair alarm set ?Nurse Communication: Mobility status ?PT Visit Diagnosis: Difficulty in walking, not elsewhere classified (R26.2);Muscle weakness (generalized) (M62.81) ?  ? ? ?Time: 0349-6116 ?PT Time Calculation (min) (ACUTE ONLY): 33 min ? ?Charges:  $Gait Training: 23-37 mins          ?          ? ?Julaine Fusi PTA ?06/16/21, 3:01 PM  ? ?

## 2021-06-16 NOTE — Progress Notes (Signed)
Occupational Therapy Treatment ?Patient Details ?Name: Sierra Guzman ?MRN: [id number] ?DOB: [birth year] ?Today's Date: 06/16/2021 ? ? ?History of present illness Pt is an 81 y/o F admitted on 06/10/21 after presenting with c/c of SOB & noted to have low BP & tachycardia. Pt met sepsis criteria but this was later ruled out. Chest x-ray does show PNA. 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 PMH: anemia, chronic diastolic CHF, DM2, anxiety, asthma, CKD stage 3, lumbar DDD, CAD, gout, HLD, idiopathic pulmonary fibrosis, obesity, syncope ?  ?OT comments ? Pt seen for OT tx this date. Pt received in bed, agreeable to session, and endorsing she is proud of the progress she has made with therapy and eager to share. Pt denies pain, SOB, and reports limited anxiety, having recently taken medication to address it prior to session. SpO2 >97% on 4L at rest, HR in low 90's. Pt educated in energy conservation strategies to support ADL/IADL participation and minimizing over exertion/SOB/exacerbating her anxiety. Pt able to identify 1 strategy she can use when feeling anxious to support her when unable to take medication. OT facilitated problem solving for additional coping strategies, time mgt, and routines modifications for toileting to support bladder incontinence and minimize leaks/accidents. Pt verbalized understanding, expressed appreciation for instruction. Pt plans to return home with good family support and additional services in place.   ? ?Recommendations for follow up therapy are one component of a multi-disciplinary discharge planning process, led by the attending physician.  Recommendations may be updated based on patient status, additional functional criteria and insurance authorization. ?   ?Follow Up Recommendations ? Home health OT  ?  ?Assistance Recommended at Discharge Intermittent Supervision/Assistance  ?Patient can return home  with the following ? A little help with walking and/or transfers;A little help with bathing/dressing/bathroom;Assistance with cooking/housework;Assist for transportation;Help with stairs or ramp for entrance;Direct supervision/assist for medications management ?  ?Equipment Recommendations ? BSC/3in1  ?  ?Recommendations for Other Services   ? ?  ?Precautions / Restrictions Precautions ?Precautions: Fall ?Precaution Comments: closely watch HR/O2 ?Restrictions ?Weight Bearing Restrictions: No  ? ? ?  ? ?Mobility Bed Mobility ?  ?  ?  ?  ?  ?  ?  ?General bed mobility comments: deferred ?  ? ?Transfers ?  ?  ?  ?  ?  ?  ?  ?  ?  ?  ?  ?  ?Balance   ?  ?  ?  ?  ?  ?  ?  ?  ?  ?  ?  ?  ?  ?  ?  ?  ?  ?  ?   ? ?ADL either performed or assessed with clinical judgement  ? ?ADL Overall ADL's : Needs assistance/impaired ?  ?  ?Grooming: Sitting;Set up ?  ?  ?  ?  ?  ?  ?  ?  ?  ?  ?  ?  ?  ?  ?  ?  ?  ?  ? ?Extremity/Trunk Assessment   ?  ?  ?  ?  ?  ? ?Vision   ?  ?  ?Perception   ?  ?Praxis   ?  ? ?Cognition Arousal/Alertness: Awake/alert ?Behavior During Therapy: [facility] for tasks assessed/performed ?Overall Cognitive Status: Within Functional Limits for tasks assessed ?  ?  ?  ?  ?  ?  ?  ?  ?  ?  ?  ?  ?  ?  ?  ?  ?  ?  ?  ?   ?  Exercises Other Exercises ?Other Exercises: Pt educated in energy conservation strategies to support ADL/IADL participation and minimizing over exertion/SOB/exacerbating her anxiety. Pt able to identify 1 strategy she can use when feeling anxious to support her when unable to take medication. OT facilitated problem solving for additional coping strategies, time mgt, and routines modifications for toileting to support bladder incontinence and minimize leaks/accidents ? ?  ?Shoulder Instructions   ? ? ?  ?General Comments    ? ? ?Pertinent Vitals/ Pain       Pain Assessment ?Pain Assessment: No/denies pain ? ?Home Living   ?  ?  ?  ?  ?  ?  ?  ?  ?  ?  ?  ?  ?  ?  ?  ?  ?  ?  ? ?  ?Prior  Functioning/Environment    ?  ?  ?  ?   ? ?Frequency ? Min 2X/week  ? ? ? ? ?  ?Progress Toward Goals ? ?OT Goals(current goals can now be found in the care plan section) ? Progress towards OT goals: Progressing toward goals ? ?Acute Rehab OT Goals ?Patient Stated Goal: go home ?OT Goal Formulation: With patient ?Time For Goal Achievement: 06/27/21 ?Potential to Achieve Goals: Good  ?Plan Discharge plan needs to be updated;Frequency remains appropriate   ? ?Co-evaluation ? ? ?   ?  ?  ?  ?  ? ?  ?AM-PAC OT "6 Clicks" Daily Activity     ?Outcome Measure ? ? Help from another person eating meals?: None ?Help from another person taking care of personal grooming?: None ?Help from another person toileting, which includes using toliet, bedpan, or urinal?: A Little ?Help from another person bathing (including washing, rinsing, drying)?: A Little ?Help from another person to put on and taking off regular upper body clothing?: A Little ?Help from another person to put on and taking off regular lower body clothing?: A Little ?6 Click Score: 20 ? ?  ?End of Session Equipment Utilized During Treatment: Oxygen ? ?OT Visit Diagnosis: Other abnormalities of gait and mobility (R26.89);Muscle weakness (generalized) (M62.81) ?  ?Activity Tolerance Patient tolerated treatment well ?  ?Patient Left in bed;with call bell/phone within reach;with bed alarm set ?  ?Nurse Communication   ?  ? ?   ? ?Time: [id number] ?OT Time Calculation (min): 18 min ? ?Charges: OT General Charges ?$OT Visit: 1 Visit ?OT Treatments ?$Self Care/Home Management : 8-22 mins ? ?Ardeth Perfect., MPH, MS, OTR/L ?ascom [phone] ?06/16/21, 3:47 PM ?

## 2021-06-16 NOTE — Care Management Important Message (Signed)
Important Message ? ?Patient Details  ?Name: Sierra Guzman ?MRN: [id number] ?Date of Birth: [birth year] ? ? ?Medicare Important Message Given:  Other (see comment) ? ?Disposition to discharge home with hospice services.  Medicare IM withheld at this time out of respect for patient and family.  ? ? ? ?[name] ?06/16/2021, 1:49 PM ?

## 2021-06-16 NOTE — Progress Notes (Signed)
PULMONOLOGY         Date: 06/16/2021,   MRN# [id number] Sierra Guzman [birth year]     AdmissionWeight: 63.9 kg                 CurrentWeight: 61.1 kg   Referring physician: Dr Leslye Peer   CHIEF COMPLAINT:   Acute on chronic hypoxemic respiratory failure   HISTORY OF PRESENT ILLNESS   This 81 year old female with a history of interstitial lung disease and chronic hypoxemia on supplemental O2 at with recurrent exacerbations, CHF with reduced LVEF at 45%, chronic diabetes mellitus.  She was evaluated by me in the office and at that time was in mild to moderate respiratory distress and acute on chronic hypoxemia but adamantly declined being hospitalized at that time.  During outpatient visit she did receive intramuscular 20 mg Solu-Medrol as well as increased supplemental oxygen and half liter of resuscitative fluids due to clinical findings of dehydration including skin tenting on all 4 extremities with poor urine output per family.  After this outpatient treatment she improved substantially and actually was able to take a nap in our clinic for over 2 hours while the nurse watched over her.  This improvement however was very transient and short-lived and patient again became worse and finally did come to the ER.  She received nonrebreather in the emergency room as well as resuscitative fluids and stated she improved.  Family reports severe anxiety disorder with recurrent panic attacks.  Patient also received 2 L of resuscitative fluids on admission as well as empiric antibiotics with vancomycin, cefepime, azithromycin.  She was admitted to the medical floor and pulmonary consultation was placed due to complex pulmonary history with interstitial lung disease, chronic hypoxemia and recurrent exacerbations of underlying ILD.   06/13/21- patient is slightly improved s/p >6L urine output with diuresis.  She is nonlabored nontachypneic during my evaluation.  She had cardiology evaluation today.   Myself and Dr Fletcher Anon spoke with [name] on phone regarding palliative care evaluation   06/14/21- patient with no acute events overnight. She appears more comfortable.  I spoke with daughter [name] and reviewed medical plan. She has palliative care evaluation.   06/15/21- reviewed care plan with daughter [name] today. Patient is improved, optimizing for DC planning.   06/16/21-  patient is improved and can dc home with current medications. Can continue mycophenolate 250mg  bid and prednisone 40mg  to be tapered by 5mg  per week. I will follow up in 1 wk on outpatient.   PAST MEDICAL HISTORY   Past Medical History:  Diagnosis Date   Anemia 10/28/2010   unspecified   Anxiety    Aortic valve sclerosis    Arthritis    Asthma    Chronic combined systolic and diastolic CHF (congestive heart failure) ([place])    echo- 10/22 LVEF 45-55%, G1DD, moderately reduced RV fxn, mild aortic regurg/stenosis   Chronic kidney disease    Chronic Kidney Disease---Stage III   Coronary artery disease 10/28/2010   DDD (degenerative disc disease), lumbar 2006   Diabetes type 2, controlled ([place]) 10/28/2010   Dyspnea    GERD (gastroesophageal reflux disease)    Gout 10/28/2010   Heart murmur    History of cardiac catheterization 2006   LAD 50%, D1 50% rest normal.    History of cardiovascular stress test 09/23/2009    lexiscan Normal Cardiolite test, EF 61%. LV size and function - normal   History of echocardiogram 05/27/2008   EF 55%; Mild aortic  insufficiency with minimal aortic sclerosis. L vent normal.    Hyperlipidemia    Hyperlipidemia LDL goal <100 10/28/2010   managed by Dr. Dorena Bodo   Hypertension 10/28/2010   IPF (idiopathic pulmonary fibrosis) ([place])    Progressive, with UIP   Mild vitamin D deficiency 02/22/2011   Obesity (BMI 30-39.9) 10/28/2010   unspecified   Osteoarthritis 10/28/2010   bilateral knees, left hip   Personal history of colonic polyps 10/28/2010   Shortness of breath    Syncope 2013    Vitamin B 12 deficiency 10/28/2010     SURGICAL HISTORY   Past Surgical History:  Procedure Laterality Date   BACK SURGERY  2006   DDD   BREAST BIOPSY Right    neg   CARDIAC CATHETERIZATION  2006   EYE SURGERY Bilateral    Cataract Extraction with IOL   JOINT REPLACEMENT Left    Left Total Hip Replacement   JOINT REPLACEMENT     RIGHT 2005; LEFT 2 X 2004, 2011   REPLACEMENT TOTAL KNEE Left    REVISION TOTAL KNEE ARTHROPLASTY Left    Dr. [place] Right 12/12/2016   Procedure: TOTAL HIP ARTHROPLASTY ANTERIOR APPROACH;  Surgeon: Hessie Knows, MD;  Location: [facility];  Service: Orthopedics;  Laterality: Right;   TOTAL HIP ARTHROPLASTY Left 12/26/2013   Procedure: ARTHROPLASTY HIP TOTAL; Surgeon: Zorita Pang, MD; Location: [place]; Service: Orthopedics; Laterality: left,    TOTAL KNEE ARTHROPLASTY     both knees     FAMILY HISTORY   Family History  Problem Relation Age of Onset   Breast cancer Mother 64   Diabetes Mother    Stroke Mother    Stroke Brother    Breast cancer Cousin        pat cousin     SOCIAL HISTORY   Social History   Tobacco Use   Smoking status: Former    Packs/day: 0.25    Years: 10.00    Pack years: 2.50    Types: Cigarettes    Quit date: 08/18/1990    Years since quitting: 30.8   Smokeless tobacco: Never  Vaping Use   Vaping Use: Never used  Substance Use Topics   Alcohol use: No   Drug use: No     MEDICATIONS    Home Medication:    Current Medication:  Current Facility-Administered Medications:    0.9 %  sodium chloride infusion, 250 mL, Intravenous, PRN, Posey Pronto, Ekta V, MD   0.9 %  sodium chloride infusion, 250 mL, Intravenous, Continuous, Rust-Chester, Toribio Harbour L, NP   acetaminophen (TYLENOL) tablet 650 mg, 650 mg, Oral, Q6H PRN, 650 mg at 06/16/21 0041 **OR** acetaminophen (TYLENOL) suppository 650 mg, 650 mg, Rectal, Q6H PRN, Para Skeans, MD   ALPRAZolam Duanne Moron) tablet  0.25 mg, 0.25 mg, Oral, TID PRN, Leslye Peer, Richard, MD, 0.25 mg at 06/16/21 0831   budesonide (PULMICORT) nebulizer solution 0.25 mg, 0.25 mg, Nebulization, BID, Rust-Chester, Britton L, NP, 0.25 mg at 06/16/21 0802   Chlorhexidine Gluconate Cloth 2 % PADS 6 each, 6 each, Topical, Daily, Rust-Chester, Britton L, NP, 6 each at 06/16/21 0931   dextromethorphan-guaiFENesin (MUCINEX DM) 30-600 MG per 12 hr tablet 1 tablet, 1 tablet, Oral, BID, Rust-Chester, Britton L, NP, 1 tablet at 06/16/21 0826   dextrose 50 % solution 50 mL, 1 ampule, Intravenous, PRN, Tamala Julian, Rondell A, MD   digoxin (LANOXIN) tablet 0.125 mg, 0.125 mg, Oral, Daily, Fletcher Anon, Henriette  A, MD, 0.125 mg at 06/16/21 0827   empagliflozin (JARDIANCE) tablet 10 mg, 10 mg, Oral, Daily, Wieting, Richard, MD, 10 mg at 06/16/21 0827   enoxaparin (LOVENOX) injection 40 mg, 40 mg, Subcutaneous, Q24H, Wynelle Cleveland, RPH, 40 mg at 06/15/21 2134   feeding supplement (GLUCERNA SHAKE) (GLUCERNA SHAKE) liquid 237 mL, 237 mL, Oral, BID BM, Leslye Peer, Richard, MD, 237 mL at 06/16/21 0931   furosemide (LASIX) tablet 20 mg, 20 mg, Oral, Daily, Leslye Peer, Richard, MD, 20 mg at 06/16/21 [id number]   gabapentin (NEURONTIN) capsule 100 mg, 100 mg, Oral, TID, Kristopher Oppenheim, DO   hydrALAZINE (APRESOLINE) injection 5 mg, 5 mg, Intravenous, Q4H PRN, Para Skeans, MD   insulin aspart (novoLOG) injection 0-9 Units, 0-9 Units, Subcutaneous, TID WC, Smith, Rondell A, MD, 3 Units at 06/16/21 1215   insulin aspart (novoLOG) injection 3 Units, 3 Units, Subcutaneous, TID WC, Wieting, Richard, MD, 3 Units at 06/16/21 1216   ipratropium-albuterol (DUONEB) 0.5-2.5 (3) MG/3ML nebulizer solution 3 mL, 3 mL, Nebulization, TID, Schertz, Michele Mcalpine, MD, 3 mL at 06/16/21 0802   ipratropium-albuterol (DUONEB) 0.5-2.5 (3) MG/3ML nebulizer solution 3 mL, 3 mL, Nebulization, Q4H PRN, Schertz, Michele Mcalpine, MD   lidocaine (LIDODERM) 5 % 1 patch, 1 patch, Transdermal, Q24H, Wieting, Richard, MD, 1  patch at 06/16/21 1048   MEDLINE mouth rinse, 15 mL, Mouth Rinse, BID, Rust-Chester, Britton L, NP, 15 mL at 06/16/21 0931   metoprolol succinate (TOPROL-XL) 24 hr tablet 12.5 mg, 12.5 mg, Oral, Daily, Leslye Peer, Richard, MD, 12.5 mg at 06/16/21 [id number]   metoprolol tartrate (LOPRESSOR) injection 5 mg, 5 mg, Intravenous, PRN, Benita Gutter, RPH   mirtazapine (REMERON) tablet 15 mg, 15 mg, Oral, QHS, Annita Brod, MD, 15 mg at 06/15/21 2132   multivitamin with minerals tablet 1 tablet, 1 tablet, Oral, Daily, Loletha Grayer, MD, 1 tablet at 06/16/21 [id number]   pantoprazole (PROTONIX) EC tablet 40 mg, 40 mg, Oral, Daily, Wynelle Cleveland, RPH, 40 mg at 06/15/21 2133   predniSONE (DELTASONE) tablet 40 mg, 40 mg, Oral, Q breakfast, Ottie Glazier, MD, 40 mg at 06/16/21 [id number]   protein supplement (ENSURE MAX) liquid, 11 oz, Oral, QHS, Wieting, Richard, MD, 11 oz at 06/15/21 2230   sertraline (ZOLOFT) tablet 25 mg, 25 mg, Oral, Daily, Gevena Barre K, MD, 25 mg at 06/15/21 2133   sodium chloride flush (NS) 0.9 % injection 3 mL, 3 mL, Intravenous, Q12H, Florina Ou V, MD, 3 mL at 06/16/21 0827   sodium chloride flush (NS) 0.9 % injection 3 mL, 3 mL, Intravenous, PRN, Para Skeans, MD    ALLERGIES   Morphine and related     REVIEW OF SYSTEMS    Review of Systems:  Gen:  Denies  fever, sweats, chills weigh loss  HEENT: Denies blurred vision, double vision, ear pain, eye pain, hearing loss, nose bleeds, sore throat Cardiac:  No dizziness, chest pain or heaviness, chest tightness,edema Resp:   Denies cough or sputum porduction, shortness of breath,wheezing, hemoptysis,  Gi: Denies swallowing difficulty, stomach pain, nausea or vomiting, diarrhea, constipation, bowel incontinence Gu:  Denies bladder incontinence, burning urine Ext:   Denies Joint pain, stiffness or swelling Skin: Denies  skin rash, easy bruising or bleeding or hives Endoc:  Denies polyuria, polydipsia , polyphagia or  weight change Psych:   Denies depression, insomnia or hallucinations   Other:  All other systems negative   VS: BP (!) 91/53 (BP Location: Left Arm)    Pulse  89    Temp 98.2 F (36.8 C)    Resp 20    Ht 5\' 5"  (1.651 m)    Wt 61.1 kg    SpO2 100%    BMI 22.42 kg/m      PHYSICAL EXAM    GENERAL:NAD, no fevers, chills, no weakness no fatigue HEAD: Normocephalic, atraumatic.  EYES: Pupils equal, round, reactive to light. Extraocular muscles intact. No scleral icterus.  MOUTH: Moist mucosal membrane. Dentition intact. No abscess noted.  EAR, NOSE, THROAT: Clear without exudates. No external lesions.  NECK: Supple. No thyromegaly. No nodules. No JVD.  PULMONARY: crackles at bases bilaterally  CARDIOVASCULAR: S1 and S2. Regular rate and rhythm. No murmurs, rubs, or gallops. No edema. Pedal pulses 2+ bilaterally.  GASTROINTESTINAL: Soft, nontender, nondistended. No masses. Positive bowel sounds. No hepatosplenomegaly.  MUSCULOSKELETAL: No swelling, clubbing, or edema. Range of motion full in all extremities.  NEUROLOGIC: Cranial nerves II through XII are intact. No gross focal neurological deficits. Sensation intact. Reflexes intact.  SKIN: No ulceration, lesions, rashes, or cyanosis. Skin warm and dry. Turgor intact.  PSYCHIATRIC: Mood, affect within normal limits. The patient is awake, alert and oriented x 3. Insight, judgment intact.       IMAGING    CT Angio Chest PE W and/or Wo Contrast  Result Date: 06/10/2021 CLINICAL DATA:  Increasing shortness of breath, increasing oxygen requirement, history of pulmonary fibrosis EXAM: CT ANGIOGRAPHY CHEST WITH CONTRAST TECHNIQUE: Multidetector CT imaging of the chest was performed using the standard protocol during bolus administration of intravenous contrast. Multiplanar CT image reconstructions and MIPs were obtained to evaluate the vascular anatomy. RADIATION DOSE REDUCTION: This exam was performed according to the departmental  dose-optimization program which includes automated exposure control, adjustment of the mA and/or kV according to patient size and/or use of iterative reconstruction technique. CONTRAST:  54mL OMNIPAQUE IOHEXOL 350 MG/ML SOLN COMPARISON:  06/10/2021, 01/12/2021 FINDINGS: Cardiovascular: This is a technically adequate evaluation of the pulmonary vasculature. No filling defects or pulmonary emboli. Stable cardiomegaly without pericardial effusion. Normal caliber of the thoracic aorta. Stable atherosclerosis of the aortic arch and coronary vasculature. Mediastinum/Nodes: Decreased mediastinal adenopathy, measuring up to 1.9 cm in the subcarinal station and 1.4 cm in the precarinal region. Thyroid, trachea, and esophagus are unremarkable. Moderate hiatal hernia. Lungs/Pleura: Basilar predominant scarring and fibrosis again noted, with continued bibasilar bronchiectasis. There are areas of increased interlobular septal thickening and ground-glass airspace disease within the mid and upper lung zones, and superimposed edema cannot be excluded. No effusion or pneumothorax. Central airways are patent. Upper Abdomen: No acute abnormality. Musculoskeletal: No acute or destructive bony lesions. Reconstructed images demonstrate no additional findings. Review of the MIP images confirms the above findings. IMPRESSION: 1. No evidence of pulmonary embolus. 2. 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 3. Increased interlobular septal thickening and ground-glass opacities within the mid and upper lung zones, which could reflect superimposed edema. 4.  Aortic Atherosclerosis (ICD10-I70.0). Electronically Signed   By: Randa Ngo M.D.   On: 06/10/2021 19:14   DG Chest Port 1 View  Result Date: 06/10/2021 CLINICAL DATA:  Questionable sepsis, evaluate for abnormality. Increasing shortness of breath and oxygen requirement. History of pulmonary fibrosis. EXAM: PORTABLE CHEST 1 VIEW COMPARISON:  Most  recent radiograph 05/09/2021, chest CT 01/12/2021 FINDINGS: Low lung volumes persist. Sequela of chronic interstitial lung disease with reticulation and basilar honeycombing. This is stable in appearance from prior exam. Similar cardiomegaly with unchanged mediastinal contours. No superimposed airspace disease,  large pleural effusion or pneumothorax. Grossly stable osseous structures. IMPRESSION: Chronic interstitial lung disease, similar in appearance to prior. No evidence of superimposed acute process. Electronically Signed   By: Keith Rake M.D.   On: 06/10/2021 17:12   ECHOCARDIOGRAM COMPLETE  Result Date: 06/11/2021    ECHOCARDIOGRAM REPORT   Patient Name:   JAHNIA HOLLIDAY Date of Exam: 06/11/2021 Medical Rec #:  [id number]    Height:       65.0 in Accession #:    [id number]   Weight:       139.8 lb Date of Birth:  04/19/1940    BSA:          1.699 m Patient Age:    20 years     BP:           89/57 mmHg Patient Gender: F            HR:           106 bpm. Exam Location:  [facility] Procedure: 2D Echo, Cardiac Doppler and Color Doppler Indications:     CHF-Acute Systolic [id number] / [id number]  History:         Patient has prior history of Echocardiogram examinations. CAD;                  Signs/Symptoms:Shortness of Breath and Syncope.  Sonographer:     Alyse Low Roar Referring Phys:  [place] Diagnosing Phys: Ida Rogue MD IMPRESSIONS  1. Left ventricular ejection fraction, by estimation, is 30 to 35%. The left ventricle has moderately decreased function. The left ventricle demonstrates global hypokinesis. The left ventricular internal cavity size was mildly dilated. Left ventricular diastolic parameters are consistent with Grade I diastolic dysfunction (impaired relaxation).  2. Right ventricular systolic function is mildly reduced. The right ventricular size is mildly enlarged. Mildly increased right ventricular wall thickness. There is moderately elevated pulmonary artery systolic pressure. The estimated  right ventricular systolic pressure is 123456 mmHg.  3. The mitral valve is normal in structure. Mild mitral valve regurgitation. No evidence of mitral stenosis.  4. Tricuspid valve regurgitation is moderate.  5. The aortic valve is normal in structure. Aortic valve regurgitation is not visualized. Aortic valve sclerosis/calcification is present, without any evidence of aortic stenosis.  6. Compared to prior study dated 12/09/2020, EF has decreased significantly. FINDINGS  Left Ventricle: Left ventricular ejection fraction, by estimation, is 30 to 35%. The left ventricle has moderately decreased function. The left ventricle demonstrates global hypokinesis. The left ventricular internal cavity size was mildly dilated. There is no left ventricular hypertrophy. Left ventricular diastolic parameters are consistent with Grade I diastolic dysfunction (impaired relaxation). Right Ventricle: The right ventricular size is mildly enlarged. Mildly increased right ventricular wall thickness. Right ventricular systolic function is mildly reduced. There is moderately elevated pulmonary artery systolic pressure. The tricuspid regurgitant velocity is 3.18 m/s, and with an assumed right atrial pressure of 5 mmHg, the estimated right ventricular systolic pressure is 123456 mmHg. Left Atrium: Left atrial size was normal in size. Right Atrium: Right atrial size was normal in size. Pericardium: There is no evidence of pericardial effusion. Mitral Valve: The mitral valve is normal in structure. Mild mitral valve regurgitation. No evidence of mitral valve stenosis. Tricuspid Valve: The tricuspid valve is normal in structure. Tricuspid valve regurgitation is moderate . No evidence of tricuspid stenosis. Aortic Valve: The aortic valve is normal in structure. Aortic valve regurgitation is not visualized. Aortic valve sclerosis/calcification is present, without  any evidence of aortic stenosis. Aortic valve peak gradient measures 12.7 mmHg. Pulmonic  Valve: The pulmonic valve was normal in structure. Pulmonic valve regurgitation is mild. No evidence of pulmonic stenosis. Aorta: The aortic root is normal in size and structure. IAS/Shunts: No atrial level shunt detected by color flow Doppler.  LEFT VENTRICLE PLAX 2D LVIDd:         4.63 cm      Diastology LVIDs:         3.91 cm      LV e' medial:    4.24 cm/s LV PW:         0.84 cm      LV E/e' medial:  13.6 LV IVS:        0.95 cm      LV e' lateral:   7.29 cm/s LVOT diam:     2.10 cm      LV E/e' lateral: 7.9 LVOT Area:     3.46 cm  LV Volumes (MOD) LV vol d, MOD A2C: 104.0 ml LV vol d, MOD A4C: 104.0 ml LV vol s, MOD A2C: 64.3 ml LV vol s, MOD A4C: 68.3 ml LV SV MOD A2C:     39.7 ml LV SV MOD A4C:     104.0 ml LV SV MOD BP:      37.2 ml RIGHT VENTRICLE RV Basal diam:  3.24 cm RV Mid diam:    2.59 cm RV S prime:     13.90 cm/s TAPSE (M-mode): 1.9 cm LEFT ATRIUM             Index        RIGHT ATRIUM           Index LA diam:        4.50 cm 2.65 cm/m   RA Area:     14.90 cm LA Vol (A2C):   56.2 ml 33.08 ml/m  RA Volume:   37.60 ml  22.13 ml/m LA Vol (A4C):   51.1 ml 30.08 ml/m LA Biplane Vol: 56.4 ml 33.20 ml/m  AORTIC VALVE                 PULMONIC VALVE AV Area (Vmax): 1.48 cm     PV Vmax:          0.91 m/s AV Vmax:        178.00 cm/s  PV Peak grad:     3.3 mmHg AV Peak Grad:   12.7 mmHg    PR End Diast Vel: 15.84 msec LVOT Vmax:      75.90 cm/s   RVOT Peak grad:   1 mmHg  AORTA Ao Root diam: 2.50 cm Ao Asc diam:  2.60 cm MITRAL VALVE               TRICUSPID VALVE MV Area (PHT): 5.50 cm    TR Peak grad:   40.4 mmHg MV Decel Time: 138 msec    TR Vmax:        318.00 cm/s MV E velocity: 57.50 cm/s MV A velocity: 93.00 cm/s  SHUNTS MV E/A ratio:  0.62        Systemic Diam: 2.10 cm MV A Prime:    11.6 cm/s Ida Rogue MD Electronically signed by Ida Rogue MD Signature Date/Time: 06/11/2021/4:42:08 PM    Final     Ref Range & Units 1 yr ago  Anti-JO 1 Ab <20 Units <20   Anti-PL-7 Ab Negative Negative    Comment: This test was  developed and its performance characteristics  determined by [facility]. It has not been cleared or  approved by the Food and Drug Administration.  Anti-PL-12 Ab Negative Negative   Comment: This test was developed and its performance characteristics  determined by [facility]. It has not been cleared or  approved by the Food and Drug Administration.  Anti-EJ Ab Negative Negative   Comment: This test was developed and its performance characteristics  determined by [facility]. It has not been cleared or  approved by the Food and Drug Administration.  Anti-OJ Ab Negative Negative   Comment: This test was developed and its performance characteristics  determined by [facility]. It has not been cleared or  approved by the Food and Drug Administration.  Anti-SRP Ab Negative Negative   Comment: This test was developed and its performance characteristics  determined by [facility]. It has not been cleared or  approved by the Food and Drug Administration.  Anti-Mi-2 Ab Negative Negative   Comment: This test was developed and its performance characteristics  determined by [facility]. It has not been cleared or  approved by the Food and Drug Administration.  Anti-TIF 1gamma Ab <20 Units <20   Comment: This test was developed and its performance characteristics  determined by [facility]. It has not been cleared or  approved by the Food and Drug Administration.  Anti-MDA-5 Ab (CADM-140) <20 Units <20   Comment: This test was developed and its performance characteristics  determined by [facility]. It has not been cleared or  approved by the Food and Drug Administration.  Anti-NXP-2 (P140) Ab <20 Units <20   Comment: This test was developed and its performance characteristics  determined by [facility]. It has not been cleared or  approved by the Food and Drug Administration.  ANTI-PM/SCL-100 AB <20 Units <20   Comment: This test was developed and its performance characteristics  determined by  [facility]. It has not been cleared or  approved by the Food and Drug Administration.  Anti-Ku Ab Negative Negative   Comment: This test was developed and its performance characteristics  determined by [facility]. It has not been cleared or  approved by the Food and Drug Administration.  Anti-SS-A 52kD Ab, IgG <20 Units <20   Comment: This test was developed and its performance characteristics  determined by [facility]. It has not been cleared or  approved by the Food and Drug Administration.  Anti-U1 RNP Ab <20 Units <20   Comment: This test was developed and its performance characteristics  determined by [facility]. It has not been cleared or  approved by the Food and Drug Administration.  Anti-U2 RNP Ab Negative Negative   Comment: This test was developed and its performance characteristics  determined by [facility]. It has not been cleared or  approved by the Food and Drug Administration.  Anti-U3 RNP (Fibrillarin) Negative Negative   Comment: This test was developed and its performance characteristics  determined by [facility]. It has not been cleared or  approved by the Food and Drug Administration.      Interpretation for Anti-Jo-1, Anti-TIF-1gamma,      Anti-MDA-5, Anti-NXP-2, Anti-PM/Scl-100,      Anti-SS-A 52 kD, Anti-U1 RNP:          Negative:                                <20          Weak Positive:  20 - 39          Moderate Positive:                   40 - 80          Strong Positive:                         >80                                                        .        ASSESSMENT/PLAN   Acute on chronic hypoxemic respiratory failure     - due to exacerbation of ILD - present on admission      - patient is improved s/p steroids and IVF      - patient reporting severe anxiety disorder   -autoimmune workup is negative from previous lab review     -IV solumedrol 40 bid >>40 daily      - Due to DM with hyperglycemia have placed Diabetic coordinator  consultation   Acute on chronic systolic CHF   - patient is currently without chest pain and is able to speak in full sentences    - caution with fluid balance    -EF has reduced substantially in past 1 year per cardiology - poor prognosis  Acute on chronic renal failure     - s/p diuresis      - dc non essential nephrotoxins    Moderate protein calorie malnutrition     - bitermporal wasting with hypolabuminemia      Thank you for allowing me to participate in the care of this patient.    Patient/Family are satisfied with care plan and all questions have been answered.  This document was prepared using Dragon voice recognition software and may include unintentional dictation errors.     Ottie Glazier, M.D.  Division of [place]

## 2021-06-17 LAB — ASPERGILLUS ANTIGEN, BAL/SERUM: Aspergillus Ag, BAL/Serum: 0.05 Index (ref 0.00–0.49)

## 2021-06-17 LAB — GLUCOSE, CAPILLARY
Glucose-Capillary: 160 mg/dL — ABNORMAL HIGH (ref 70–99)
Glucose-Capillary: 148 mg/dL — ABNORMAL HIGH (ref 70–99)
Glucose-Capillary: 201 mg/dL — ABNORMAL HIGH (ref 70–99)

## 2021-06-17 LAB — DIGOXIN LEVEL: Digoxin Level: 0.7 ng/mL — ABNORMAL LOW (ref 0.8–2.0)

## 2021-06-17 MED ORDER — MYCOPHENOLATE MOFETIL 250 MG PO CAPS: 250.0000 mg | ORAL_CAPSULE | Freq: Two times a day (BID) | ORAL | 1 refills | Status: AC

## 2021-06-17 MED ORDER — FUROSEMIDE 20 MG PO TABS: 20.0000 mg | ORAL_TABLET | Freq: Every day | ORAL | 1 refills | Status: DC

## 2021-06-17 MED ORDER — EMPAGLIFLOZIN 10 MG PO TABS: 10.0000 mg | ORAL_TABLET | Freq: Every day | ORAL | 2 refills | Status: DC

## 2021-06-17 MED ORDER — PREDNISONE 10 MG PO TABS: ORAL_TABLET | ORAL | 0 refills | Status: AC

## 2021-06-17 MED ORDER — LIDOCAINE 5 % EX PTCH: 1.0000 | MEDICATED_PATCH | CUTANEOUS | 0 refills | Status: DC

## 2021-06-17 MED ORDER — ALPRAZOLAM 0.25 MG PO TABS: 0.2500 mg | ORAL_TABLET | Freq: Three times a day (TID) | ORAL | 0 refills | Status: DC | PRN

## 2021-06-17 MED ORDER — ENSURE MAX PROTEIN PO LIQD: 11.0000 [oz_av] | Freq: Every day | ORAL | Status: AC

## 2021-06-17 MED ORDER — DIGOXIN 125 MCG PO TABS: 0.1250 mg | ORAL_TABLET | Freq: Every day | ORAL | 1 refills | Status: DC

## 2021-06-17 MED ORDER — GLUCERNA SHAKE PO LIQD: 237.0000 mL | Freq: Two times a day (BID) | ORAL | 0 refills | Status: AC

## 2021-06-17 NOTE — TOC Transition Note (Signed)
Transition of Care (TOC) - CM/SW Discharge Note ? ? ?Patient Details  ?Name: Sierra Guzman ?MRN: [id number] ?Date of Birth: 11-14-1940 ? ?Transition of Care (TOC) CM/SW Contact:  ?Margarito Liner, LCSW ?Phone Number: ?06/17/2021, 3:04 PM ? ? ?Clinical Narrative:  Patient has orders to discharge home with hospice today. [facility] representative is aware. Patient was on the phone with daughter [name] so updated her. She is at the home waiting on her to arrive. EMS transport has been arranged and she is 2nd on the list. No further concerns. CSW signing off.  ? ?Final next level of care: Home w Hospice Care ?Barriers to Discharge: Barriers Resolved ? ? ?Patient Goals and CMS Choice ?  ?  ?Choice offered to / list presented to : Patient, Adult Children ? ?Discharge Placement ?  ?           ?  ?Patient to be transferred to facility by: EMS ?Name of family member notified: [name] ?Patient and family notified of of transfer: 06/17/21 ? ?Discharge Plan and Services ?  ?  ?Post Acute Care Choice: Resumption of Svcs/PTA Provider          ?  ?  ?  ?  ?  ?HH Arranged: RN, PT, OT, Nurse's Aide, Social Work ?HH Agency: [facility] ?Date HH Agency Contacted: 06/13/21 ?  ?Representative spoke with at [facility] Agency: [place] [facility] ? ?Social Determinants of Health (SDOH) Interventions ?  ? ? ?Readmission Risk Interventions ?Readmission Risk Prevention Plan 06/13/2021 03/09/2021  ?Transportation Screening Complete Complete  ?PCP or Specialist Appt within 3-5 Days - Complete  ?HRI or Home Care Consult - Complete  ?Social Work Consult for Recovery Care Planning/Counseling - Complete  ?Palliative Care Screening - Not Applicable  ?Medication Review Oceanographer) Complete Complete  ?PCP or Specialist appointment within 3-5 days of discharge Complete -  ?HRI or Home Care Consult Complete -  ?SW Recovery Care/Counseling Consult Complete -  ?Palliative Care Screening Not Applicable -  ?Skilled Nursing Facility Patient Refused -   ?Some recent data might be hidden  ? ? ? ? ? ?

## 2021-06-17 NOTE — Progress Notes (Signed)
PULMONOLOGY         Date: 06/17/2021,   MRN# [id number] Sierra Guzman 1940/04/30     AdmissionWeight: 63.9 kg                 CurrentWeight: 60 kg   Referring physician: Dr Sierra Guzman   CHIEF COMPLAINT:   Acute on chronic hypoxemic respiratory failure   HISTORY OF PRESENT ILLNESS   This 81 year old female with a history of interstitial lung disease and chronic hypoxemia on supplemental O2 at with recurrent exacerbations, CHF with reduced LVEF at 45%, chronic diabetes mellitus.  She was evaluated by me in the office and at that time was in mild to moderate respiratory distress and acute on chronic hypoxemia but adamantly declined being hospitalized at that time.  During outpatient visit she did receive intramuscular 20 mg Solu-Medrol as well as increased supplemental oxygen and half liter of resuscitative fluids due to clinical findings of dehydration including skin tenting on all 4 extremities with poor urine output per family.  After this outpatient treatment she improved substantially and actually was able to take a nap in our clinic for over 2 hours while the nurse watched over her.  This improvement however was very transient and short-lived and patient again became worse and finally did come to the ER.  She received nonrebreather in the emergency room as well as resuscitative fluids and stated she improved.  Family reports severe anxiety disorder with recurrent panic attacks.  Patient also received 2 L of resuscitative fluids on admission as well as empiric antibiotics with vancomycin, cefepime, azithromycin.  She was admitted to the medical floor and pulmonary consultation was placed due to complex pulmonary history with interstitial lung disease, chronic hypoxemia and recurrent exacerbations of underlying ILD.   06/13/21- patient is slightly improved s/p >6L urine output with diuresis.  She is nonlabored nontachypneic during my evaluation.  She had cardiology evaluation today.   Myself and Dr Sierra Guzman spoke with Sierra Guzman on phone regarding palliative care evaluation   06/14/21- patient with no acute events overnight. She appears more comfortable.  I spoke with daughter Sierra Guzman and reviewed medical plan. She has palliative care evaluation.   06/15/21- reviewed care plan with daughter Sierra Guzman today. Patient is improved, optimizing for DC planning.   06/16/21-  patient is improved and can dc home with current medications. Can continue mycophenolate 250mg  bid and prednisone 40mg  to be tapered by 5mg  per week. I will follow up in 1 wk on outpatient.   06/17/21- patient is nonlabored breathing and is able to speak in full sentences. She is able to take cellcept on outpatient 250mg  bid.   PAST MEDICAL HISTORY   Past Medical History:  Diagnosis Date   Anemia 10/28/2010   unspecified   Anxiety    Aortic valve sclerosis    Arthritis    Asthma    Chronic combined systolic and diastolic CHF (congestive heart failure) ([place])    echo- 10/22 LVEF 45-55%, G1DD, moderately reduced RV fxn, mild aortic regurg/stenosis   Chronic kidney disease    Chronic Kidney Disease---Stage III   Coronary artery disease 10/28/2010   DDD (degenerative disc disease), lumbar 2006   Diabetes type 2, controlled ([place]) 10/28/2010   Dyspnea    GERD (gastroesophageal reflux disease)    Gout 10/28/2010   Heart murmur    History of cardiac catheterization 2006   LAD 50%, D1 50% rest normal.    History of cardiovascular stress test 09/23/2009  lexiscan Normal Cardiolite test, EF 61%. LV size and function - normal   History of echocardiogram 05/27/2008   EF 55%; Mild aortic insufficiency with minimal aortic sclerosis. L vent normal.    Hyperlipidemia    Hyperlipidemia LDL goal <100 10/28/2010   managed by Dr. Dorena Bodo   Hypertension 10/28/2010   IPF (idiopathic pulmonary fibrosis) ([place])    Progressive, with UIP   Mild vitamin D deficiency 02/22/2011   Obesity (BMI 30-39.9) 10/28/2010   unspecified    Osteoarthritis 10/28/2010   bilateral knees, left hip   Personal history of colonic polyps 10/28/2010   Shortness of breath    Syncope 2013   Vitamin B 12 deficiency 10/28/2010     SURGICAL HISTORY   Past Surgical History:  Procedure Laterality Date   BACK SURGERY  2006   DDD   BREAST BIOPSY Right    neg   CARDIAC CATHETERIZATION  2006   EYE SURGERY Bilateral    Cataract Extraction with IOL   JOINT REPLACEMENT Left    Left Total Hip Replacement   JOINT REPLACEMENT     RIGHT 2005; LEFT 2 X 2004, 2011   REPLACEMENT TOTAL KNEE Left    REVISION TOTAL KNEE ARTHROPLASTY Left    Dr. Mitchell Right 12/12/2016   Procedure: TOTAL HIP ARTHROPLASTY ANTERIOR APPROACH;  Surgeon: Hessie Knows, MD;  Location: [facility];  Service: Orthopedics;  Laterality: Right;   TOTAL HIP ARTHROPLASTY Left 12/26/2013   Procedure: ARTHROPLASTY HIP TOTAL; Surgeon: Zorita Pang, MD; Location: [facility]; Service: Orthopedics; Laterality: left,    TOTAL KNEE ARTHROPLASTY     both knees     FAMILY HISTORY   Family History  Problem Relation Age of Onset   Breast cancer Mother 84   Diabetes Mother    Stroke Mother    Stroke Brother    Breast cancer Cousin        pat cousin     SOCIAL HISTORY   Social History   Tobacco Use   Smoking status: Former    Packs/day: 0.25    Years: 10.00    Pack years: 2.50    Types: Cigarettes    Quit date: 08/18/1990    Years since quitting: 30.8   Smokeless tobacco: Never  Vaping Use   Vaping Use: Never used  Substance Use Topics   Alcohol use: No   Drug use: No     MEDICATIONS    Home Medication:    Current Medication:  Current Facility-Administered Medications:    0.9 %  sodium chloride infusion, 250 mL, Intravenous, PRN, Posey Pronto, Ekta V, MD   0.9 %  sodium chloride infusion, 250 mL, Intravenous, Continuous, Rust-Chester, Toribio Harbour L, NP   acetaminophen (TYLENOL) tablet 650 mg, 650 mg, Oral, Q6H PRN,  650 mg at 06/16/21 0041 **OR** acetaminophen (TYLENOL) suppository 650 mg, 650 mg, Rectal, Q6H PRN, Para Skeans, MD   ALPRAZolam Duanne Moron) tablet 0.25 mg, 0.25 mg, Oral, TID PRN, Sierra Guzman, Richard, MD, 0.25 mg at 06/16/21 2205   budesonide (PULMICORT) nebulizer solution 0.25 mg, 0.25 mg, Nebulization, BID, Rust-Chester, Britton L, NP, 0.25 mg at 06/17/21 0755   Chlorhexidine Gluconate Cloth 2 % PADS 6 each, 6 each, Topical, Daily, Rust-Chester, Britton L, NP, 6 each at 06/17/21 0832   dextromethorphan-guaiFENesin (MUCINEX DM) 30-600 MG per 12 hr tablet 1 tablet, 1 tablet, Oral, BID, Rust-Chester, Britton L, NP, 1 tablet at 06/17/21 0832   dextrose 50 %  solution 50 mL, 1 ampule, Intravenous, PRN, Tamala Julian, Rondell A, MD   digoxin (LANOXIN) tablet 0.125 mg, 0.125 mg, Oral, Daily, Sierra Guzman, Muhammad A, MD, 0.125 mg at 06/17/21 0830   empagliflozin (JARDIANCE) tablet 10 mg, 10 mg, Oral, Daily, Wieting, Richard, MD, 10 mg at 06/17/21 0830   enoxaparin (LOVENOX) injection 40 mg, 40 mg, Subcutaneous, Q24H, Wynelle Cleveland, RPH, 40 mg at 06/16/21 2205   feeding supplement (GLUCERNA SHAKE) (GLUCERNA SHAKE) liquid 237 mL, 237 mL, Oral, BID BM, Sierra Guzman, Richard, MD, 237 mL at 06/17/21 [id number]   furosemide (LASIX) tablet 20 mg, 20 mg, Oral, Daily, Wieting, Richard, MD, 20 mg at 06/17/21 0831   gabapentin (NEURONTIN) capsule 100 mg, 100 mg, Oral, TID, Kristopher Oppenheim, DO, 100 mg at 06/17/21 0831   hydrALAZINE (APRESOLINE) injection 5 mg, 5 mg, Intravenous, Q4H PRN, Para Skeans, MD   insulin aspart (novoLOG) injection 0-9 Units, 0-9 Units, Subcutaneous, TID WC, Smith, Rondell A, MD, 2 Units at 06/17/21 [id number]   insulin aspart (novoLOG) injection 3 Units, 3 Units, Subcutaneous, TID WC, Loletha Grayer, MD, 3 Units at 06/17/21 [id number]   ipratropium-albuterol (DUONEB) 0.5-2.5 (3) MG/3ML nebulizer solution 3 mL, 3 mL, Nebulization, TID, Schertz, Michele Mcalpine, MD, 3 mL at 06/17/21 0755   ipratropium-albuterol (DUONEB) 0.5-2.5 (3)  MG/3ML nebulizer solution 3 mL, 3 mL, Nebulization, Q4H PRN, Schertz, Michele Mcalpine, MD   lidocaine (LIDODERM) 5 % 1 patch, 1 patch, Transdermal, Q24H, Wieting, Richard, MD, 1 patch at 06/16/21 1048   MEDLINE mouth rinse, 15 mL, Mouth Rinse, BID, Rust-Chester, Toribio Harbour L, NP, 15 mL at 06/17/21 [id number]   metoprolol succinate (TOPROL-XL) 24 hr tablet 12.5 mg, 12.5 mg, Oral, Daily, Sierra Guzman, Richard, MD, 12.5 mg at 06/17/21 0831   metoprolol tartrate (LOPRESSOR) injection 5 mg, 5 mg, Intravenous, PRN, Benita Gutter, RPH   mirtazapine (REMERON) tablet 15 mg, 15 mg, Oral, QHS, Annita Brod, MD, 15 mg at 06/16/21 2206   multivitamin with minerals tablet 1 tablet, 1 tablet, Oral, Daily, Loletha Grayer, MD, 1 tablet at 06/17/21 [id number]   mycophenolate (CELLCEPT) capsule 250 mg, 250 mg, Oral, BID, Lanney Gins, Saharah Sherrow, MD, 250 mg at 06/17/21 0830   pantoprazole (PROTONIX) EC tablet 40 mg, 40 mg, Oral, Daily, Wynelle Cleveland, RPH, 40 mg at 06/16/21 2205   predniSONE (DELTASONE) tablet 40 mg, 40 mg, Oral, Q breakfast, Ottie Glazier, MD, 40 mg at 06/17/21 0831   protein supplement (ENSURE MAX) liquid, 11 oz, Oral, QHS, Wieting, Richard, MD, 11 oz at 06/16/21 2204   sertraline (ZOLOFT) tablet 25 mg, 25 mg, Oral, Daily, Gevena Barre K, MD, 25 mg at 06/16/21 2205   sodium chloride flush (NS) 0.9 % injection 3 mL, 3 mL, Intravenous, Q12H, Florina Ou V, MD, 3 mL at 06/17/21 [id number]   sodium chloride flush (NS) 0.9 % injection 3 mL, 3 mL, Intravenous, PRN, Para Skeans, MD    ALLERGIES   Morphine and related     REVIEW OF SYSTEMS    Review of Systems:  Gen:  Denies  fever, sweats, chills weigh loss  HEENT: Denies blurred vision, double vision, ear pain, eye pain, hearing loss, nose bleeds, sore throat Cardiac:  No dizziness, chest pain or heaviness, chest tightness,edema Resp:   Denies cough or sputum porduction, shortness of breath,wheezing, hemoptysis,  Gi: Denies swallowing difficulty, stomach  pain, nausea or vomiting, diarrhea, constipation, bowel incontinence Gu:  Denies bladder incontinence, burning urine Ext:   Denies Joint pain, stiffness or swelling Skin: Denies  skin rash, easy bruising or bleeding or hives Endoc:  Denies polyuria, polydipsia , polyphagia or weight change Psych:   Denies depression, insomnia or hallucinations   Other:  All other systems negative   VS: BP (!) 131/92 (BP Location: Left Arm)    Pulse (!) 103    Temp 98 F (36.7 C) (Oral)    Resp 17    Ht 5\' 5"  (1.651 m)    Wt 60 kg    SpO2 100%    BMI 22.01 kg/m      PHYSICAL EXAM    GENERAL:NAD, no fevers, chills, no weakness no fatigue HEAD: Normocephalic, atraumatic.  EYES: Pupils equal, round, reactive to light. Extraocular muscles intact. No scleral icterus.  MOUTH: Moist mucosal membrane. Dentition intact. No abscess noted.  EAR, NOSE, THROAT: Clear without exudates. No external lesions.  NECK: Supple. No thyromegaly. No nodules. No JVD.  PULMONARY: crackles at bases bilaterally  CARDIOVASCULAR: S1 and S2. Regular rate and rhythm. No murmurs, rubs, or gallops. No edema. Pedal pulses 2+ bilaterally.  GASTROINTESTINAL: Soft, nontender, nondistended. No masses. Positive bowel sounds. No hepatosplenomegaly.  MUSCULOSKELETAL: No swelling, clubbing, or edema. Range of motion full in all extremities.  NEUROLOGIC: Cranial nerves II through XII are intact. No gross focal neurological deficits. Sensation intact. Reflexes intact.  SKIN: No ulceration, lesions, rashes, or cyanosis. Skin warm and dry. Turgor intact.  PSYCHIATRIC: Mood, affect within normal limits. The patient is awake, alert and oriented x 3. Insight, judgment intact.       IMAGING    CT Angio Chest PE W and/or Wo Contrast  Result Date: 06/10/2021 CLINICAL DATA:  Increasing shortness of breath, increasing oxygen requirement, history of pulmonary fibrosis EXAM: CT ANGIOGRAPHY CHEST WITH CONTRAST TECHNIQUE: Multidetector CT imaging of  the chest was performed using the standard protocol during bolus administration of intravenous contrast. Multiplanar CT image reconstructions and MIPs were obtained to evaluate the vascular anatomy. RADIATION DOSE REDUCTION: This exam was performed according to the departmental dose-optimization program which includes automated exposure control, adjustment of the mA and/or kV according to patient size and/or use of iterative reconstruction technique. CONTRAST:  72mL OMNIPAQUE IOHEXOL 350 MG/ML SOLN COMPARISON:  06/10/2021, 01/12/2021 FINDINGS: Cardiovascular: This is a technically adequate evaluation of the pulmonary vasculature. No filling defects or pulmonary emboli. Stable cardiomegaly without pericardial effusion. Normal caliber of the thoracic aorta. Stable atherosclerosis of the aortic arch and coronary vasculature. Mediastinum/Nodes: Decreased mediastinal adenopathy, measuring up to 1.9 cm in the subcarinal station and 1.4 cm in the precarinal region. Thyroid, trachea, and esophagus are unremarkable. Moderate hiatal hernia. Lungs/Pleura: Basilar predominant scarring and fibrosis again noted, with continued bibasilar bronchiectasis. There are areas of increased interlobular septal thickening and ground-glass airspace disease within the mid and upper lung zones, and superimposed edema cannot be excluded. No effusion or pneumothorax. Central airways are patent. Upper Abdomen: No acute abnormality. Musculoskeletal: No acute or destructive bony lesions. Reconstructed images demonstrate no additional findings. Review of the MIP images confirms the above findings. IMPRESSION: 1. No evidence of pulmonary embolus. 2. 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 3. Increased interlobular septal thickening and ground-glass opacities within the mid and upper lung zones, which could reflect superimposed edema. 4.  Aortic Atherosclerosis (ICD10-I70.0). Electronically Signed   By: Randa Ngo M.D.   On: 06/10/2021 19:14   DG Chest Port 1 View  Result Date: 06/10/2021 CLINICAL DATA:  Questionable sepsis, evaluate for abnormality. Increasing shortness of breath and oxygen requirement. History of  pulmonary fibrosis. EXAM: PORTABLE CHEST 1 VIEW COMPARISON:  Most recent radiograph 05/09/2021, chest CT 01/12/2021 FINDINGS: Low lung volumes persist. Sequela of chronic interstitial lung disease with reticulation and basilar honeycombing. This is stable in appearance from prior exam. Similar cardiomegaly with unchanged mediastinal contours. No superimposed airspace disease, large pleural effusion or pneumothorax. Grossly stable osseous structures. IMPRESSION: Chronic interstitial lung disease, similar in appearance to prior. No evidence of superimposed acute process. Electronically Signed   By: Keith Rake M.D.   On: 06/10/2021 17:12   ECHOCARDIOGRAM COMPLETE  Result Date: 06/11/2021    ECHOCARDIOGRAM REPORT   Patient Name:   KAMORIA VITTITOW Date of Exam: 06/11/2021 Medical Rec #:  [id number]    Height:       65.0 in Accession #:    [id number]   Weight:       139.8 lb Date of Birth:  05-08-1940    BSA:          1.699 m Patient Age:    73 years     BP:           89/57 mmHg Patient Gender: F            HR:           106 bpm. Exam Location:  [facility] Procedure: 2D Echo, Cardiac Doppler and Color Doppler Indications:     CHF-Acute Systolic [id number] / [id number]  History:         Patient has prior history of Echocardiogram examinations. CAD;                  Signs/Symptoms:Shortness of Breath and Syncope.  Sonographer:     Alyse Low Roar Referring Phys:  [place] Diagnosing Phys: Ida Rogue MD IMPRESSIONS  1. Left ventricular ejection fraction, by estimation, is 30 to 35%. The left ventricle has moderately decreased function. The left ventricle demonstrates global hypokinesis. The left ventricular internal cavity size was mildly dilated. Left ventricular diastolic parameters are consistent with Grade I  diastolic dysfunction (impaired relaxation).  2. Right ventricular systolic function is mildly reduced. The right ventricular size is mildly enlarged. Mildly increased right ventricular wall thickness. There is moderately elevated pulmonary artery systolic pressure. The estimated right ventricular systolic pressure is 123456 mmHg.  3. The mitral valve is normal in structure. Mild mitral valve regurgitation. No evidence of mitral stenosis.  4. Tricuspid valve regurgitation is moderate.  5. The aortic valve is normal in structure. Aortic valve regurgitation is not visualized. Aortic valve sclerosis/calcification is present, without any evidence of aortic stenosis.  6. Compared to prior study dated 12/09/2020, EF has decreased significantly. FINDINGS  Left Ventricle: Left ventricular ejection fraction, by estimation, is 30 to 35%. The left ventricle has moderately decreased function. The left ventricle demonstrates global hypokinesis. The left ventricular internal cavity size was mildly dilated. There is no left ventricular hypertrophy. Left ventricular diastolic parameters are consistent with Grade I diastolic dysfunction (impaired relaxation). Right Ventricle: The right ventricular size is mildly enlarged. Mildly increased right ventricular wall thickness. Right ventricular systolic function is mildly reduced. There is moderately elevated pulmonary artery systolic pressure. The tricuspid regurgitant velocity is 3.18 m/s, and with an assumed right atrial pressure of 5 mmHg, the estimated right ventricular systolic pressure is 123456 mmHg. Left Atrium: Left atrial size was normal in size. Right Atrium: Right atrial size was normal in size. Pericardium: There is no evidence of pericardial effusion. Mitral Valve: The mitral valve is normal in structure. Mild  mitral valve regurgitation. No evidence of mitral valve stenosis. Tricuspid Valve: The tricuspid valve is normal in structure. Tricuspid valve regurgitation is moderate .  No evidence of tricuspid stenosis. Aortic Valve: The aortic valve is normal in structure. Aortic valve regurgitation is not visualized. Aortic valve sclerosis/calcification is present, without any evidence of aortic stenosis. Aortic valve peak gradient measures 12.7 mmHg. Pulmonic Valve: The pulmonic valve was normal in structure. Pulmonic valve regurgitation is mild. No evidence of pulmonic stenosis. Aorta: The aortic root is normal in size and structure. IAS/Shunts: No atrial level shunt detected by color flow Doppler.  LEFT VENTRICLE PLAX 2D LVIDd:         4.63 cm      Diastology LVIDs:         3.91 cm      LV e' medial:    4.24 cm/s LV PW:         0.84 cm      LV E/e' medial:  13.6 LV IVS:        0.95 cm      LV e' lateral:   7.29 cm/s LVOT diam:     2.10 cm      LV E/e' lateral: 7.9 LVOT Area:     3.46 cm  LV Volumes (MOD) LV vol d, MOD A2C: 104.0 ml LV vol d, MOD A4C: 104.0 ml LV vol s, MOD A2C: 64.3 ml LV vol s, MOD A4C: 68.3 ml LV SV MOD A2C:     39.7 ml LV SV MOD A4C:     104.0 ml LV SV MOD BP:      37.2 ml RIGHT VENTRICLE RV Basal diam:  3.24 cm RV Mid diam:    2.59 cm RV S prime:     13.90 cm/s TAPSE (M-mode): 1.9 cm LEFT ATRIUM             Index        RIGHT ATRIUM           Index LA diam:        4.50 cm 2.65 cm/m   RA Area:     14.90 cm LA Vol (A2C):   56.2 ml 33.08 ml/m  RA Volume:   37.60 ml  22.13 ml/m LA Vol (A4C):   51.1 ml 30.08 ml/m LA Biplane Vol: 56.4 ml 33.20 ml/m  AORTIC VALVE                 PULMONIC VALVE AV Area (Vmax): 1.48 cm     PV Vmax:          0.91 m/s AV Vmax:        178.00 cm/s  PV Peak grad:     3.3 mmHg AV Peak Grad:   12.7 mmHg    PR End Diast Vel: 15.84 msec LVOT Vmax:      75.90 cm/s   RVOT Peak grad:   1 mmHg  AORTA Ao Root diam: 2.50 cm Ao Asc diam:  2.60 cm MITRAL VALVE               TRICUSPID VALVE MV Area (PHT): 5.50 cm    TR Peak grad:   40.4 mmHg MV Decel Time: 138 msec    TR Vmax:        318.00 cm/s MV E velocity: 57.50 cm/s MV A velocity: 93.00 cm/s  SHUNTS  MV E/A ratio:  0.62        Systemic Diam: 2.10 cm MV A Prime:  11.6 cm/s Ida Rogue MD Electronically signed by Ida Rogue MD Signature Date/Time: 06/11/2021/4:42:08 PM    Final     Ref Range & Units 1 yr ago  Anti-JO 1 Ab <20 Units <20   Anti-PL-7 Ab Negative Negative   Comment: This test was developed and its performance characteristics  determined by [facility]. It has not been cleared or  approved by the Food and Drug Administration.  Anti-PL-12 Ab Negative Negative   Comment: This test was developed and its performance characteristics  determined by [facility]. It has not been cleared or  approved by the Food and Drug Administration.  Anti-EJ Ab Negative Negative   Comment: This test was developed and its performance characteristics  determined by [facility]. It has not been cleared or  approved by the Food and Drug Administration.  Anti-OJ Ab Negative Negative   Comment: This test was developed and its performance characteristics  determined by [facility]. It has not been cleared or  approved by the Food and Drug Administration.  Anti-SRP Ab Negative Negative   Comment: This test was developed and its performance characteristics  determined by [facility]. It has not been cleared or  approved by the Food and Drug Administration.  Anti-Mi-2 Ab Negative Negative   Comment: This test was developed and its performance characteristics  determined by [facility]. It has not been cleared or  approved by the Food and Drug Administration.  Anti-TIF 1gamma Ab <20 Units <20   Comment: This test was developed and its performance characteristics  determined by [facility]. It has not been cleared or  approved by the Food and Drug Administration.  Anti-MDA-5 Ab (CADM-140) <20 Units <20   Comment: This test was developed and its performance characteristics  determined by [facility]. It has not been cleared or  approved by the Food and Drug Administration.  Anti-NXP-2 (P140) Ab <20 Units <20   Comment: This  test was developed and its performance characteristics  determined by [facility]. It has not been cleared or  approved by the Food and Drug Administration.  ANTI-PM/SCL-100 AB <20 Units <20   Comment: This test was developed and its performance characteristics  determined by [facility]. It has not been cleared or  approved by the Food and Drug Administration.  Anti-Ku Ab Negative Negative   Comment: This test was developed and its performance characteristics  determined by [facility]. It has not been cleared or  approved by the Food and Drug Administration.  Anti-SS-A 52kD Ab, IgG <20 Units <20   Comment: This test was developed and its performance characteristics  determined by [facility]. It has not been cleared or  approved by the Food and Drug Administration.  Anti-U1 RNP Ab <20 Units <20   Comment: This test was developed and its performance characteristics  determined by [facility]. It has not been cleared or  approved by the Food and Drug Administration.  Anti-U2 RNP Ab Negative Negative   Comment: This test was developed and its performance characteristics  determined by [facility]. It has not been cleared or  approved by the Food and Drug Administration.  Anti-U3 RNP (Fibrillarin) Negative Negative   Comment: This test was developed and its performance characteristics  determined by [facility]. It has not been cleared or  approved by the Food and Drug Administration.      Interpretation for Anti-Jo-1, Anti-TIF-1gamma,      Anti-MDA-5, Anti-NXP-2, Anti-PM/Scl-100,      Anti-SS-A 52 kD, Anti-U1 RNP:          Negative:                                <  20          Weak Positive:                       20 - 39          Moderate Positive:                   40 - 80          Strong Positive:                         >80                                                        .        ASSESSMENT/PLAN   Acute on chronic hypoxemic respiratory failure     - due to exacerbation of ILD - present on  admission      - patient is improved s/p steroids and IVF      - patient reporting severe anxiety disorder   -autoimmune workup is negative from previous lab review     -IV solumedrol 40 bid >>40 daily -Pred taper     - Due to DM with hyperglycemia have placed Diabetic coordinator consultation -cell cept 250 bid   Acute on chronic systolic CHF   - patient is currently without chest pain and is able to speak in full sentences    - caution with fluid balance    -EF has reduced substantially in past 1 year per cardiology - poor prognosis  Acute on chronic renal failure     - s/p diuresis      - dc non essential nephrotoxins    Moderate protein calorie malnutrition     - bitermporal wasting with hypolabuminemia      Thank you for allowing me to participate in the care of this patient.    Patient/Family are satisfied with care plan and all questions have been answered.  This document was prepared using Dragon voice recognition software and may include unintentional dictation errors.     Ottie Glazier, M.D.  Division of [facility]

## 2021-06-17 NOTE — Progress Notes (Signed)
Patient desatted to 85% on 5LNC while getting up to the bedside commode.  Was able to recover to 95%  on 4LNC ? ?

## 2021-06-17 NOTE — Progress Notes (Signed)
Inpatient Diabetes Program Recommendations ? ?AACE/ADA: New Consensus Statement on Inpatient Glycemic Control (2015) ? ?Target Ranges:  Prepandial:   less than 140 mg/dL ?     Peak postprandial:   less than 180 mg/dL (1-2 hours) ?     Critically ill patients:  140 - 180 mg/dL  ? ? ? ? ? Latest Reference Range & Units 06/16/21 07:54 06/16/21 11:36 06/16/21 16:14 06/16/21 19:46 06/16/21 23:28 06/17/21 04:16 06/17/21 08:04  ?Glucose-Capillary 70 - 99 mg/dL 132 (H) ? ?Novolog 4 units 230 (H) ? ?Novolog 6 units 256 (H) ? ?Novolog 8 units 338 (H) 194 (H) 160 (H) 148 (H) ? ?Novolog 5 units  ? ? ?Home DM Meds: Metformin 850 mg BID  ? ?Current Orders: Novolog 0-9 units TID  ?Jardiance 10 mg daily ? ?PO prednisone 40 mg Daily ?Glucerna bid between meals ? ?MD- glucose trends after steroid dose and after meal intake ? ?Please consider:  ?-   Increasing meal coverage to Novolog 6 units TID with meals ?HOLD if pt eats <50% of meals ? ? ?--Will follow patient during hospitalization-- ? ?Tama Headings RN, MSN, BC-ADM ?Inpatient Diabetes Coordinator ?Team Pager [phone] (8a-5p) ? ? ?  ? ? ? ?

## 2021-06-17 NOTE — TOC Progression Note (Signed)
Transition of Care (TOC) - Progression Note  ? ? ?Patient Details  ?Name: Sierra Guzman ?MRN: [id number] ?Date of Birth: [birth year] ? ?Transition of Care (TOC) CM/SW Contact  ?Candie Chroman, LCSW ?Phone Number: ?06/17/2021, 9:41 AM ? ?Clinical Narrative:   Per hospice liaison, DME has been delivered. Sent secure chat to MD to notify. ? ?Expected Discharge Plan: [place] ?Barriers to Discharge: Continued Medical Work up ? ?Expected Discharge Plan and Services ?Expected Discharge Plan: [place] ?  ?  ?Post Acute Care Choice: Resumption of Svcs/PTA Provider ?Living arrangements for the past 2 months: [place] ?                ?  ?  ?  ?  ?  ?HH Arranged: RN, PT, OT, Nurse's Aide, Social Work ?[place] Agency: [facility] ?Date HH Agency Contacted: 06/13/21 ?  ?Representative spoke with at [place]: [place] [facility] ? ? ?Social Determinants of Health (SDOH) Interventions ?  ? ?Readmission Risk Interventions ?Readmission Risk Prevention Plan 06/13/2021 03/09/2021  ?Transportation Screening Complete Complete  ?PCP or Specialist Appt within 3-5 Days - Complete  ?[place] or Home Care Consult - Complete  ?Social Work Consult for [place] Planning/Counseling - Complete  ?Palliative Care Screening - Not Applicable  ?Medication Review Press photographer) Complete Complete  ?PCP or Specialist appointment within 3-5 days of discharge Complete -  ?[place] or Home Care Consult Complete -  ?SW Recovery Care/Counseling Consult Complete -  ?Palliative Care Screening Not Applicable -  ?[place] Patient Refused -  ?Some recent data might be hidden  ? ? ?

## 2021-06-17 NOTE — Discharge Summary (Signed)
Physician Discharge Summary   Patient: Sierra Guzman MRN: [id number] DOB: [birth year]  Admit date:     06/10/2021  Discharge date: 06/17/21  Discharge Physician: Annita Brod   PCP: Wardell Honour, MD   Recommendations at discharge:   New medication: Digoxin 0.125 p.o. daily Medication change: Allopurinol discontinued New medication: Lasix 20 mg p.o. daily New medication: Jardiance 10 mg p.o. daily Medication change: Prednisone taper starting at 40 mg then decrease by 5 mg weekly until down to 10 mg New medication: CellCept 250 mg p.o. twice daily New medication: Lidoderm patch 5% topically on for 12 hours, off for 12 hours Patient is going home with home hospice  Discharge Diagnoses: Principal Problem:   Acute on chronic respiratory failure with hypoxemia ([place]) Active Problems:   Acute on chronic combined systolic and diastolic CHF (congestive heart failure) (HCC)   Interstitial lung disease (HCC)   Chronic kidney disease, stage 3a ([place])   Uncontrolled type 2 diabetes mellitus with hyperglycemia, without long-term current use of insulin (HCC)   Hypertension   Hypotension   Anemia of chronic disease   Pressure injury of skin   Hypomagnesemia   Coronary artery disease   Malnutrition of moderate degree   Elevated liver function tests   Multifocal atrial tachycardia ([place])  Resolved Problems:   Sepsis [facility])  Hospital Course: 81 year old female with past medical history of idiopathic pulmonary fibrosis and chronic respiratory failure (2 L at rest and 4 L with ambulation), CAD, CKD stage III, acute on chronic systolic congestive heart failure presented with acute hypoxic respiratory failure requiring BiPAP on presentation.  Hospital course difficult due to attempting to treat acute systolic congestive heart failure with IV Lasix and then switched over to oral torsemide, but limited with medication secondary to hypotension.  Patient on low-dose Toprol but still tachycardic so  digoxin was added by cardiology.  Cardiology wanted to do medical management.  Ejection fraction lower on this echocardiogram 30 to 35%.  Palliative care consulted and met with family on 3/7.  Consideration for further disposition to be determined.  In follow-up, family would like to take patient home with hospice treating basic medical issues with no extraordinary measures, on 3/10.  Assessment and Plan: * Acute on chronic respiratory failure with hypoxemia (HCC) Acute on chronic respiratory failure with hypoxemia.  The patient had respiratory distress on presentation requiring BiPAP.  Initial pulse ox 85% on oxygen..  The patient states that she wears 2 L oxygen at rest and 4 L with ambulation.  Patient with oxygen saturations of 100% on 4 L.   Acute on chronic combined systolic and diastolic CHF (congestive heart failure) (HCC) This echo showing EF of 30 to 35%.  Continue Toprol-XL low-dose.  With heart rates elevated cardiology added low-dose digoxin.  Heart rate still bouncing around up as high as 150.  Jardiance also added.  Conservative management as per cardiology. Limited with medication secondary to hypotension.  Restarted on p.o. Lasix and metoprolol, digoxin and Jardiance added.  Interstitial lung disease ([place]) Sepsis ruled out.  I think this is interstitial lung disease exacerbation also.  During hospitalization on Solu-Medrol, changed over to a prednisone taper that we will continue as outpatient at 40 mg and decrease by 5 mg weekly  Chronic kidney disease, stage 3a (HCC) At baseline.  Creatinine today at 1.01 with GFR of 56.  Uncontrolled type 2 diabetes mellitus with hyperglycemia, without long-term current use of insulin ([place]) Started Jardiance.  Continue sliding scale insulin  and NovoLog while on steroids.  A1c of 5.9.  Hypotension Patient tolerating low-dose Toprol.  Anemia of chronic disease Anemia of chronic disease with last ferritin being elevated.  Last hemoglobin  10.4   Hypomagnesemia Replaced during the hospital course.  Pressure injury of skin Stage II on sacrum and coccyx.  See full description below, present on admission.  Coronary artery disease Borderline troponins likely demand ischemia with acute hypoxic respiratory failure  Malnutrition of moderate degree Nutrition Status: Nutrition Problem: Moderate Malnutrition Etiology: chronic illness (CHF) Signs/Symptoms: mild fat depletion, moderate fat depletion, mild muscle depletion, moderate muscle depletion, energy intake < or equal to 75% for > or equal to 1 month Interventions: Glucerna shake, Premier Protein, MVI     Elevated liver function tests Likely secondary to CHF.  AST up to 219 and ALT up to 181.  Total bilirubin 0.6.        Pain control - [employer] Controlled Substance Reporting System database was reviewed. and patient was instructed, not to drive, operate heavy machinery, perform activities at heights, swimming or participation in water activities or provide baby-sitting services while on Pain, Sleep and Anxiety Medications; until their outpatient Physician has advised to do so again. Also recommended to not to take more than prescribed Pain, Sleep and Anxiety Medications.  Consultants: Cardiology, palliative medicine, pulmonary  Procedures performed: Echocardiogram noting ejection fraction of 30 to 35% Disposition:  Home with home hospice Diet recommendation: Heart healthy with Ensure supplementation Discharge Diet Orders (From admission, onward)     Start     Ordered   06/17/21 0000  Diet - low sodium heart healthy        06/17/21 1334           Cardiac diet DISCHARGE MEDICATION: Allergies as of 06/17/2021       Reactions   Morphine And Related Nausea And Vomiting        Medication List     STOP taking these medications    allopurinol 100 MG tablet Commonly known as: ZYLOPRIM   metFORMIN 850 MG tablet Commonly known as: Glucophage        TAKE these medications    acetaminophen 325 MG tablet Commonly known as: TYLENOL Take 2 tablets (650 mg total) by mouth every 6 (six) hours as needed (pain).   albuterol (2.5 MG/3ML) 0.083% nebulizer solution Commonly known as: PROVENTIL Take 3 mLs (2.5 mg total) by nebulization every 2 (two) hours as needed for wheezing or shortness of breath. What changed: Another medication with the same name was removed. Continue taking this medication, and follow the directions you see here.   ALPRAZolam 0.25 MG tablet Commonly known as: XANAX Take 1 tablet (0.25 mg total) by mouth 3 (three) times daily as needed for anxiety.   aspirin 81 MG EC tablet Take 1 tablet (81 mg total) by mouth daily. Swallow whole.   budesonide 0.25 MG/2ML nebulizer solution Commonly known as: PULMICORT Take 2 mLs (0.25 mg total) by nebulization 2 (two) times daily.   cholecalciferol 1000 units tablet Commonly known as: VITAMIN D Take 1,000 Units by mouth daily.   digoxin 0.125 MG tablet Commonly known as: LANOXIN Take 1 tablet (0.125 mg total) by mouth daily. Start taking on: June 18, 2021   empagliflozin 10 MG Tabs tablet Commonly known as: JARDIANCE Take 1 tablet (10 mg total) by mouth daily. Start taking on: June 18, 2021   Ensure Max Protein Liqd Take 330 mLs (11 oz total) by mouth at bedtime.  feeding supplement (GLUCERNA SHAKE) Liqd Take 237 mLs by mouth 2 (two) times daily between meals. Start taking on: June 18, 2021   Fish Oil 1000 MG Caps Take 1,000 mg by mouth daily.   furosemide 20 MG tablet Commonly known as: LASIX Take 1 tablet (20 mg total) by mouth daily. Start taking on: June 18, 2021   lidocaine 5 % Commonly known as: [place] 1 patch onto the skin daily. Remove & Discard patch within 12 hours or as directed by MD Start taking on: June 18, 2021   loratadine 10 MG tablet Commonly known as: CLARITIN Take 10 mg by mouth daily.   magnesium oxide 400 MG  tablet Commonly known as: MAG-OX Take 1 tablet by mouth daily.   metoprolol succinate 25 MG 24 hr tablet Commonly known as: TOPROL-XL Take 12.5 mg by mouth daily.   mirtazapine 15 MG tablet Commonly known as: REMERON Take 15 mg by mouth at bedtime.   mycophenolate 250 MG capsule Commonly known as: CELLCEPT Take 1 capsule (250 mg total) by mouth 2 (two) times daily.   NAC 600 MG Caps Generic drug: Acetylcysteine Take 600 mg by mouth 2 (two) times daily.   omeprazole 20 MG capsule Commonly known as: PRILOSEC Take 20 mg by mouth daily.   Pirfenidone 267 MG Tabs Take 267 mg by mouth 3 (three) times daily.   polyethylene glycol 17 g packet Commonly known as: MIRALAX / GLYCOLAX Take 17 g by mouth daily.   predniSONE 10 MG tablet Commonly known as: DELTASONE Take 4 tablets (40 mg total) by mouth daily with breakfast for 4 days, THEN 3.5 tablets (35 mg total) daily with breakfast for 7 days, THEN 3 tablets (30 mg total) daily with breakfast for 7 days, THEN 2.5 tablets (25 mg total) daily with breakfast for 7 days, THEN 2 tablets (20 mg total) daily with breakfast for 7 days, THEN 1.5 tablets (15 mg total) daily with breakfast for 7 days, THEN 1.5 tablets (15 mg total) daily with breakfast for 7 days, THEN 1 tablet (10 mg total) daily with breakfast. Start taking on: June 18, 2021 What changed: See the new instructions.   rosuvastatin 40 MG tablet Commonly known as: CRESTOR Take 40 mg by mouth daily.   sertraline 25 MG tablet Commonly known as: ZOLOFT Take 25 mg by mouth daily.   sodium chloride 0.65 % Soln nasal spray Commonly known as: OCEAN Place 2 sprays into both nostrils as needed for congestion.               Durable Medical Equipment  (From admission, onward)           Start     Ordered   06/16/21 1610  For home use only DME Hospital bed  Once       Question Answer Comment  Length of Need 6 Months   The above medical condition requires: Patient  requires the ability to reposition frequently   Bed type Semi-electric      06/16/21 1611   06/16/21 1610  For home use only DME standard manual wheelchair with seat cushion  Once       Comments: Patient suffers from Heart failure which impairs their ability to perform daily activities like bathing, dressing, and grooming in the home.  A walker will not resolve issue with performing activities of daily living. A wheelchair will allow patient to safely perform daily activities. Patient can safely propel the wheelchair in the home or has a  caregiver who can provide assistance. Length of need Lifetime. Accessories: elevating leg rests (ELRs), wheel locks, extensions and anti-tippers.   06/16/21 1611            Discharge Exam: Filed Weights   06/15/21 0422 06/16/21 0415 06/17/21 0419  Weight: 61.3 kg 61.1 kg 60 kg   General: Alert and oriented x3 Cardiovascular: Irregular rhythm, rate controlled Lungs: Few coarse breath sounds  Condition at discharge: stable  The results of significant diagnostics from this hospitalization (including imaging, microbiology, ancillary and laboratory) are listed below for reference.   Imaging Studies: CT Angio Chest PE W and/or Wo Contrast  Result Date: 06/10/2021 CLINICAL DATA:  Increasing shortness of breath, increasing oxygen requirement, history of pulmonary fibrosis EXAM: CT ANGIOGRAPHY CHEST WITH CONTRAST TECHNIQUE: Multidetector CT imaging of the chest was performed using the standard protocol during bolus administration of intravenous contrast. Multiplanar CT image reconstructions and MIPs were obtained to evaluate the vascular anatomy. RADIATION DOSE REDUCTION: This exam was performed according to the departmental dose-optimization program which includes automated exposure control, adjustment of the mA and/or kV according to patient size and/or use of iterative reconstruction technique. CONTRAST:  74m OMNIPAQUE IOHEXOL 350 MG/ML SOLN COMPARISON:   06/10/2021, 01/12/2021 FINDINGS: Cardiovascular: This is a technically adequate evaluation of the pulmonary vasculature. No filling defects or pulmonary emboli. Stable cardiomegaly without pericardial effusion. Normal caliber of the thoracic aorta. Stable atherosclerosis of the aortic arch and coronary vasculature. Mediastinum/Nodes: Decreased mediastinal adenopathy, measuring up to 1.9 cm in the subcarinal station and 1.4 cm in the precarinal region. Thyroid, trachea, and esophagus are unremarkable. Moderate hiatal hernia. Lungs/Pleura: Basilar predominant scarring and fibrosis again noted, with continued bibasilar bronchiectasis. There are areas of increased interlobular septal thickening and ground-glass airspace disease within the mid and upper lung zones, and superimposed edema cannot be excluded. No effusion or pneumothorax. Central airways are patent. Upper Abdomen: No acute abnormality. Musculoskeletal: No acute or destructive bony lesions. Reconstructed images demonstrate no additional findings. Review of the MIP images confirms the above findings. IMPRESSION: 1. No evidence of pulmonary embolus. 2. 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 3. Increased interlobular septal thickening and ground-glass opacities within the mid and upper lung zones, which could reflect superimposed edema. 4.  Aortic Atherosclerosis (ICD10-I70.0). Electronically Signed   By: MRanda NgoM.D.   On: 06/10/2021 19:14   DG Chest Port 1 View  Result Date: 06/10/2021 CLINICAL DATA:  Questionable sepsis, evaluate for abnormality. Increasing shortness of breath and oxygen requirement. History of pulmonary fibrosis. EXAM: PORTABLE CHEST 1 VIEW COMPARISON:  Most recent radiograph 05/09/2021, chest CT 01/12/2021 FINDINGS: Low lung volumes persist. Sequela of chronic interstitial lung disease with reticulation and basilar honeycombing. This is stable in appearance from prior exam. Similar  cardiomegaly with unchanged mediastinal contours. No superimposed airspace disease, large pleural effusion or pneumothorax. Grossly stable osseous structures. IMPRESSION: Chronic interstitial lung disease, similar in appearance to prior. No evidence of superimposed acute process. Electronically Signed   By: MKeith RakeM.D.   On: 06/10/2021 17:12   ECHOCARDIOGRAM COMPLETE  Result Date: 06/11/2021    ECHOCARDIOGRAM REPORT   Patient Name:   RDEDRIA ENDRESDate of Exam: 06/11/2021 Medical Rec #:  [id number]   Height:       65.0 in Accession #:    [id number]  Weight:       139.8 lb Date of Birth:  704/26/1942   BSA:  1.699 m Patient Age:    58 years     BP:           89/57 mmHg Patient Gender: F            HR:           106 bpm. Exam Location:  [facility] Procedure: 2D Echo, Cardiac Doppler and Color Doppler Indications:     CHF-Acute Systolic 235.57 / D22.02  History:         Patient has prior history of Echocardiogram examinations. CAD;                  Signs/Symptoms:Shortness of Breath and Syncope.  Sonographer:     Alyse Low Roar Referring Phys:  [place] Diagnosing Phys: Ida Rogue MD IMPRESSIONS  1. Left ventricular ejection fraction, by estimation, is 30 to 35%. The left ventricle has moderately decreased function. The left ventricle demonstrates global hypokinesis. The left ventricular internal cavity size was mildly dilated. Left ventricular diastolic parameters are consistent with Grade I diastolic dysfunction (impaired relaxation).  2. Right ventricular systolic function is mildly reduced. The right ventricular size is mildly enlarged. Mildly increased right ventricular wall thickness. There is moderately elevated pulmonary artery systolic pressure. The estimated right ventricular systolic pressure is 54.2 mmHg.  3. The mitral valve is normal in structure. Mild mitral valve regurgitation. No evidence of mitral stenosis.  4. Tricuspid valve regurgitation is moderate.  5. The aortic valve  is normal in structure. Aortic valve regurgitation is not visualized. Aortic valve sclerosis/calcification is present, without any evidence of aortic stenosis.  6. Compared to prior study dated 12/09/2020, EF has decreased significantly. FINDINGS  Left Ventricle: Left ventricular ejection fraction, by estimation, is 30 to 35%. The left ventricle has moderately decreased function. The left ventricle demonstrates global hypokinesis. The left ventricular internal cavity size was mildly dilated. There is no left ventricular hypertrophy. Left ventricular diastolic parameters are consistent with Grade I diastolic dysfunction (impaired relaxation). Right Ventricle: The right ventricular size is mildly enlarged. Mildly increased right ventricular wall thickness. Right ventricular systolic function is mildly reduced. There is moderately elevated pulmonary artery systolic pressure. The tricuspid regurgitant velocity is 3.18 m/s, and with an assumed right atrial pressure of 5 mmHg, the estimated right ventricular systolic pressure is 70.6 mmHg. Left Atrium: Left atrial size was normal in size. Right Atrium: Right atrial size was normal in size. Pericardium: There is no evidence of pericardial effusion. Mitral Valve: The mitral valve is normal in structure. Mild mitral valve regurgitation. No evidence of mitral valve stenosis. Tricuspid Valve: The tricuspid valve is normal in structure. Tricuspid valve regurgitation is moderate . No evidence of tricuspid stenosis. Aortic Valve: The aortic valve is normal in structure. Aortic valve regurgitation is not visualized. Aortic valve sclerosis/calcification is present, without any evidence of aortic stenosis. Aortic valve peak gradient measures 12.7 mmHg. Pulmonic Valve: The pulmonic valve was normal in structure. Pulmonic valve regurgitation is mild. No evidence of pulmonic stenosis. Aorta: The aortic root is normal in size and structure. IAS/Shunts: No atrial level shunt detected by  color flow Doppler.  LEFT VENTRICLE PLAX 2D LVIDd:         4.63 cm      Diastology LVIDs:         3.91 cm      LV e' medial:    4.24 cm/s LV PW:         0.84 cm      LV E/e'  medial:  13.6 LV IVS:        0.95 cm      LV e' lateral:   7.29 cm/s LVOT diam:     2.10 cm      LV E/e' lateral: 7.9 LVOT Area:     3.46 cm  LV Volumes (MOD) LV vol d, MOD A2C: 104.0 ml LV vol d, MOD A4C: 104.0 ml LV vol s, MOD A2C: 64.3 ml LV vol s, MOD A4C: 68.3 ml LV SV MOD A2C:     39.7 ml LV SV MOD A4C:     104.0 ml LV SV MOD BP:      37.2 ml RIGHT VENTRICLE RV Basal diam:  3.24 cm RV Mid diam:    2.59 cm RV S prime:     13.90 cm/s TAPSE (M-mode): 1.9 cm LEFT ATRIUM             Index        RIGHT ATRIUM           Index LA diam:        4.50 cm 2.65 cm/m   RA Area:     14.90 cm LA Vol (A2C):   56.2 ml 33.08 ml/m  RA Volume:   37.60 ml  22.13 ml/m LA Vol (A4C):   51.1 ml 30.08 ml/m LA Biplane Vol: 56.4 ml 33.20 ml/m  AORTIC VALVE                 PULMONIC VALVE AV Area (Vmax): 1.48 cm     PV Vmax:          0.91 m/s AV Vmax:        178.00 cm/s  PV Peak grad:     3.3 mmHg AV Peak Grad:   12.7 mmHg    PR End Diast Vel: 15.84 msec LVOT Vmax:      75.90 cm/s   RVOT Peak grad:   1 mmHg  AORTA Ao Root diam: 2.50 cm Ao Asc diam:  2.60 cm MITRAL VALVE               TRICUSPID VALVE MV Area (PHT): 5.50 cm    TR Peak grad:   40.4 mmHg MV Decel Time: 138 msec    TR Vmax:        318.00 cm/s MV E velocity: 57.50 cm/s MV A velocity: 93.00 cm/s  SHUNTS MV E/A ratio:  0.62        Systemic Diam: 2.10 cm MV A Prime:    11.6 cm/s Ida Rogue MD Electronically signed by Ida Rogue MD Signature Date/Time: 06/11/2021/4:42:08 PM    Final     Microbiology: Results for orders placed or performed during the hospital encounter of 06/10/21  Blood Culture (routine x 2)     Status: Abnormal   Collection Time: 06/10/21  4:42 PM   Specimen: BLOOD  Result Value Ref Range Status   Specimen Description   Final    BLOOD RIGHT ANTECUBITAL Performed at  [facility], [address], [place], [place] [address]    Special Requests   Final    BOTTLES DRAWN AEROBIC AND ANAEROBIC Blood Culture adequate volume Performed at [facility], [address] [place], [place] [address]    Culture  Setup Time   Final    GRAM POSITIVE COCCI ANAEROBIC BOTTLE ONLY CRITICAL RESULT CALLED TO, READ BACK BY AND VERIFIED WITH: Nilsa Nutting AT 0017 06/11/21.PMF    Culture (A)  Final  STAPHYLOCOCCUS EPIDERMIDIS THE SIGNIFICANCE OF ISOLATING THIS ORGANISM FROM A SINGLE SET OF BLOOD CULTURES WHEN MULTIPLE SETS ARE DRAWN IS UNCERTAIN. PLEASE NOTIFY THE MICROBIOLOGY DEPARTMENT WITHIN ONE WEEK IF SPECIATION AND SENSITIVITIES ARE REQUIRED. Performed at [facility], [place] [address], [place], [place] [address]    Report Status 06/13/2021 FINAL  Final  Blood Culture ID Panel (Reflexed)     Status: Abnormal   Collection Time: 06/10/21  4:42 PM  Result Value Ref Range Status   Enterococcus faecalis NOT DETECTED NOT DETECTED Final   Enterococcus Faecium NOT DETECTED NOT DETECTED Final   Listeria monocytogenes NOT DETECTED NOT DETECTED Final   Staphylococcus species DETECTED (A) NOT DETECTED Final    Comment: CRITICAL RESULT CALLED TO, READ BACK BY AND VERIFIED WITH: Nilsa Nutting AT 3267 06/11/21.PMF    Staphylococcus aureus (BCID) NOT DETECTED NOT DETECTED Final   Staphylococcus epidermidis DETECTED (A) NOT DETECTED Final    Comment: Methicillin (oxacillin) resistant coagulase negative staphylococcus. Possible blood culture contaminant (unless isolated from more than one blood culture draw or clinical case suggests pathogenicity). No antibiotic treatment is indicated for blood  culture contaminants. CRITICAL RESULT CALLED TO, READ BACK BY AND VERIFIED WITH: Nilsa Nutting AT 1245 06/11/21.PMF    Staphylococcus lugdunensis NOT DETECTED NOT DETECTED Final   Streptococcus species NOT DETECTED NOT DETECTED Final   Streptococcus agalactiae NOT  DETECTED NOT DETECTED Final   Streptococcus pneumoniae NOT DETECTED NOT DETECTED Final   Streptococcus pyogenes NOT DETECTED NOT DETECTED Final   A.calcoaceticus-baumannii NOT DETECTED NOT DETECTED Final   Bacteroides fragilis NOT DETECTED NOT DETECTED Final   Enterobacterales NOT DETECTED NOT DETECTED Final   Enterobacter cloacae complex NOT DETECTED NOT DETECTED Final   Escherichia coli NOT DETECTED NOT DETECTED Final   Klebsiella aerogenes NOT DETECTED NOT DETECTED Final   Klebsiella oxytoca NOT DETECTED NOT DETECTED Final   Klebsiella pneumoniae NOT DETECTED NOT DETECTED Final   Proteus species NOT DETECTED NOT DETECTED Final   Salmonella species NOT DETECTED NOT DETECTED Final   Serratia marcescens NOT DETECTED NOT DETECTED Final   Haemophilus influenzae NOT DETECTED NOT DETECTED Final   Neisseria meningitidis NOT DETECTED NOT DETECTED Final   Pseudomonas aeruginosa NOT DETECTED NOT DETECTED Final   Stenotrophomonas maltophilia NOT DETECTED NOT DETECTED Final   Candida albicans NOT DETECTED NOT DETECTED Final   Candida auris NOT DETECTED NOT DETECTED Final   Candida glabrata NOT DETECTED NOT DETECTED Final   Candida krusei NOT DETECTED NOT DETECTED Final   Candida parapsilosis NOT DETECTED NOT DETECTED Final   Candida tropicalis NOT DETECTED NOT DETECTED Final   Cryptococcus neoformans/gattii NOT DETECTED NOT DETECTED Final   Methicillin resistance mecA/C DETECTED (A) NOT DETECTED Final    Comment: CRITICAL RESULT CALLED TO, READ BACK BY AND VERIFIED WITH: Nilsa Nutting AT 8099 06/11/21.PMF Performed at [facility], [address] [place], [place] [address]   Resp Panel by RT-PCR (Flu A&B, Covid) Nasopharyngeal Swab     Status: None   Collection Time: 06/10/21  4:46 PM   Specimen: Nasopharyngeal Swab; Nasopharyngeal(NP) swabs in vial transport medium  Result Value Ref Range Status   SARS Coronavirus 2 by RT PCR NEGATIVE NEGATIVE Final    Comment: (NOTE) SARS-CoV-2  target nucleic acids are NOT DETECTED.  The SARS-CoV-2 RNA is generally detectable in upper respiratory specimens during the acute phase of infection. The lowest concentration of SARS-CoV-2 viral copies this assay can detect is 138 copies/mL. A negative result does not preclude  SARS-Cov-2 infection and should not be used as the sole basis for treatment or other patient management decisions. A negative result may occur with  improper specimen collection/handling, submission of specimen other than nasopharyngeal swab, presence of viral mutation(s) within the areas targeted by this assay, and inadequate number of viral copies(<138 copies/mL). A negative result must be combined with clinical observations, patient history, and epidemiological information. The expected result is Negative.  Fact Sheet for Patients:  EntrepreneurPulse.com.au  Fact Sheet for Healthcare Providers:  IncredibleEmployment.be  This test is no t yet approved or cleared by the [place] FDA and  has been authorized for detection and/or diagnosis of SARS-CoV-2 by FDA under an Emergency Use Authorization (EUA). This EUA will remain  in effect (meaning this test can be used) for the duration of the COVID-19 declaration under Section 564(b)(1) of the Act, 21 U.S.C.section 360bbb-3(b)(1), unless the authorization is terminated  or revoked sooner.       Influenza A by PCR NEGATIVE NEGATIVE Final   Influenza B by PCR NEGATIVE NEGATIVE Final    Comment: (NOTE) The Xpert Xpress SARS-CoV-2/FLU/RSV plus assay is intended as an aid in the diagnosis of influenza from Nasopharyngeal swab specimens and should not be used as a sole basis for treatment. Nasal washings and aspirates are unacceptable for Xpert Xpress SARS-CoV-2/FLU/RSV testing.  Fact Sheet for Patients: EntrepreneurPulse.com.au  Fact Sheet for Healthcare  Providers: IncredibleEmployment.be  This test is not yet approved or cleared by the [place] FDA and has been authorized for detection and/or diagnosis of SARS-CoV-2 by FDA under an Emergency Use Authorization (EUA). This EUA will remain in effect (meaning this test can be used) for the duration of the COVID-19 declaration under Section 564(b)(1) of the Act, 21 U.S.C. section 360bbb-3(b)(1), unless the authorization is terminated or revoked.  Performed at [facility], [address] [place], [place] [address]   MRSA Next Gen by PCR, Nasal     Status: None   Collection Time: 06/10/21  9:35 PM   Specimen: Nasal Mucosa; Nasal Swab  Result Value Ref Range Status   MRSA by PCR Next Gen NOT DETECTED NOT DETECTED Final    Comment: (NOTE) The GeneXpert MRSA Assay (FDA approved for NASAL specimens only), is one component of a comprehensive MRSA colonization surveillance program. It is not intended to diagnose MRSA infection nor to guide or monitor treatment for MRSA infections. Test performance is not FDA approved in patients less than 63 years old. Performed at [facility], [address] [place], [place] [address]   Blood Culture (routine x 2)     Status: None   Collection Time: 06/10/21 10:49 PM   Specimen: BLOOD  Result Value Ref Range Status   Specimen Description BLOOD BLOOD LEFT WRIST  Final   Special Requests   Final    BOTTLES DRAWN AEROBIC AND ANAEROBIC Blood Culture adequate volume   Culture   Final    NO GROWTH 5 DAYS Performed at [facility], [address], [place], [place] [address]    Report Status 06/15/2021 FINAL  Final  Urine Culture     Status: None   Collection Time: 06/10/21 11:26 PM   Specimen: In/Out Cath Urine  Result Value Ref Range Status   Specimen Description   Final    IN/OUT CATH URINE Performed at [facility], [address] [place], [place] [address]    Special Requests    Final    NONE Performed at [facility]  [facility], [address] [place], [place] [address]    Culture   Final    NO GROWTH Performed at [facility], [place] [address] [place], [place] [address]    Report Status 06/12/2021 FINAL  Final  Respiratory (~20 pathogens) panel by PCR     Status: None   Collection Time: 06/10/21 11:26 PM   Specimen: Nasopharyngeal Swab; Respiratory  Result Value Ref Range Status   Adenovirus NOT DETECTED NOT DETECTED Final   Coronavirus 229E NOT DETECTED NOT DETECTED Final    Comment: (NOTE) The Coronavirus on the Respiratory Panel, DOES NOT test for the novel  Coronavirus (2019 nCoV)    Coronavirus HKU1 NOT DETECTED NOT DETECTED Final   Coronavirus NL63 NOT DETECTED NOT DETECTED Final   Coronavirus OC43 NOT DETECTED NOT DETECTED Final   Metapneumovirus NOT DETECTED NOT DETECTED Final   Rhinovirus / Enterovirus NOT DETECTED NOT DETECTED Final   Influenza A NOT DETECTED NOT DETECTED Final   Influenza B NOT DETECTED NOT DETECTED Final   Parainfluenza Virus 1 NOT DETECTED NOT DETECTED Final   Parainfluenza Virus 2 NOT DETECTED NOT DETECTED Final   Parainfluenza Virus 3 NOT DETECTED NOT DETECTED Final   Parainfluenza Virus 4 NOT DETECTED NOT DETECTED Final   Respiratory Syncytial Virus NOT DETECTED NOT DETECTED Final   Bordetella pertussis NOT DETECTED NOT DETECTED Final   Bordetella Parapertussis NOT DETECTED NOT DETECTED Final   Chlamydophila pneumoniae NOT DETECTED NOT DETECTED Final   Mycoplasma pneumoniae NOT DETECTED NOT DETECTED Final    Comment: Performed at [facility], [place] [address], [place],  [address]  Aspergillus Ag, BAL/Serum     Status: None   Collection Time: 06/15/21  5:33 AM   Specimen: Vein  Result Value Ref Range Status   Aspergillus Ag, BAL/Serum 0.05 0.00 - 0.49 Index Final    Comment: (NOTE) Performed At: [facility], [place] [id number] Rush Farmer MD QI:[id number]      Labs: CBC: Recent Labs  Lab 06/10/21 1708 06/10/21 2011 06/11/21 0509 06/12/21 0524 06/13/21 0959 06/15/21 0532  WBC 8.9 11.0* 12.0* 13.8*  --  9.0  NEUTROABS 7.3  --   --   --   --   --   HGB 9.2* 9.1* 9.5* 9.2* 10.4* 10.2*  HCT 30.4* 30.0* 31.1* 29.6*  --  33.4*  MCV 103.1* 103.8* 101.6* 100.7*  --  101.2*  PLT 241 211 237 224  --  875   Basic Metabolic Panel: Recent Labs  Lab 06/10/21 2011 06/11/21 0509 06/11/21 1210 06/12/21 0524 06/13/21 0959 06/14/21 0558 06/15/21 0532 06/16/21 0415  NA  --  144   < > 140 139 141 137 137  K  --  3.9   < > 4.1 4.1 4.3 4.6 4.5  CL  --  104   < > 99 92* 91* 92* 95*  CO2  --  33*   < > 33* 36* 40* 36* 33*  GLUCOSE  --  138*   < > 117* 264* 161* 151* 140*  BUN  --  23   < > 29* 33* 42* 39* 32*  CREATININE 0.91 0.81   < > 1.03* 1.00 0.84 1.01* 0.93  CALCIUM  --  9.4   < > 9.5 9.5 9.6 10.0 9.6  MG 1.6* 3.7*  --   --   --   --   --   --   PHOS  --  3.0  --   --   --   --   --   --    < > =  values in this interval not displayed.   Liver Function Tests: Recent Labs  Lab 06/10/21 1708 06/11/21 0509 06/12/21 0524 06/14/21 0558 06/15/21 0532  AST 81* 94* 155* 219* 90*  ALT 46* 74* 91* 181* 128*  ALKPHOS 47 57 57 75 73  BILITOT 1.0 0.7 0.4 0.6 0.7  PROT 6.3* 6.4* 6.3* 6.0* 6.3*  ALBUMIN 2.9* 3.1* 3.0* 2.9* 3.2*   CBG: Recent Labs  Lab 06/16/21 1946 06/16/21 2328 06/17/21 0416 06/17/21 0804 06/17/21 1137  GLUCAP 338* 194* 160* 148* 201*    Discharge time spent: less than 30 minutes.  Signed: Annita Brod, MD [facility] 06/17/2021

## 2021-06-17 NOTE — Progress Notes (Signed)
PIV removed. Discharge instructions completed. Patient verbalized understanding of medication regimen, follow up appointments and discharge instructions. Patient belongings gathered and packed to discharge. Daughter [name] called and meds and DC instructions explained. ? ? ?

## 2021-06-24 ENCOUNTER — Ambulatory Visit: Payer: Medicare HMO | Admitting: Family

## 2021-09-14 ENCOUNTER — Other Ambulatory Visit: Payer: Self-pay

## 2021-09-14 ENCOUNTER — Emergency Department

## 2021-09-14 ENCOUNTER — Inpatient Hospital Stay

## 2021-09-14 ENCOUNTER — Observation Stay
Admission: EM | Admit: 2021-09-14 | Discharge: 2021-09-15 | Disposition: A | Discharge status: Home / Self Care | Attending: Internal Medicine | Admitting: Internal Medicine

## 2021-09-14 DIAGNOSIS — M9701XA Periprosthetic fracture around internal prosthetic right hip joint, initial encounter: Principal | ICD-10-CM | POA: Insufficient documentation

## 2021-09-14 DIAGNOSIS — Z96643 Presence of artificial hip joint, bilateral: Secondary | ICD-10-CM | POA: Insufficient documentation

## 2021-09-14 DIAGNOSIS — Z87891 Personal history of nicotine dependence: Secondary | ICD-10-CM | POA: Diagnosis not present

## 2021-09-14 DIAGNOSIS — Z96649 Presence of unspecified artificial hip joint: Secondary | ICD-10-CM

## 2021-09-14 DIAGNOSIS — I5042 Chronic combined systolic (congestive) and diastolic (congestive) heart failure: Secondary | ICD-10-CM | POA: Diagnosis not present

## 2021-09-14 DIAGNOSIS — Z20822 Contact with and (suspected) exposure to covid-19: Secondary | ICD-10-CM | POA: Diagnosis not present

## 2021-09-14 DIAGNOSIS — M25551 Pain in right hip: Secondary | ICD-10-CM | POA: Diagnosis present

## 2021-09-14 DIAGNOSIS — J45909 Unspecified asthma, uncomplicated: Secondary | ICD-10-CM | POA: Diagnosis not present

## 2021-09-14 DIAGNOSIS — Z7984 Long term (current) use of oral hypoglycemic drugs: Secondary | ICD-10-CM | POA: Insufficient documentation

## 2021-09-14 DIAGNOSIS — Z7982 Long term (current) use of aspirin: Secondary | ICD-10-CM | POA: Diagnosis not present

## 2021-09-14 DIAGNOSIS — I130 Hypertensive heart and chronic kidney disease with heart failure and stage 1 through stage 4 chronic kidney disease, or unspecified chronic kidney disease: Secondary | ICD-10-CM | POA: Insufficient documentation

## 2021-09-14 DIAGNOSIS — Y92009 Unspecified place in unspecified non-institutional (private) residence as the place of occurrence of the external cause: Secondary | ICD-10-CM | POA: Diagnosis not present

## 2021-09-14 DIAGNOSIS — N1832 Chronic kidney disease, stage 3b: Secondary | ICD-10-CM | POA: Diagnosis not present

## 2021-09-14 DIAGNOSIS — I2510 Atherosclerotic heart disease of native coronary artery without angina pectoris: Secondary | ICD-10-CM | POA: Insufficient documentation

## 2021-09-14 DIAGNOSIS — Z79899 Other long term (current) drug therapy: Secondary | ICD-10-CM | POA: Diagnosis not present

## 2021-09-14 DIAGNOSIS — E875 Hyperkalemia: Secondary | ICD-10-CM | POA: Insufficient documentation

## 2021-09-14 DIAGNOSIS — S0990XA Unspecified injury of head, initial encounter: Secondary | ICD-10-CM | POA: Diagnosis not present

## 2021-09-14 DIAGNOSIS — M978XXA Periprosthetic fracture around other internal prosthetic joint, initial encounter: Secondary | ICD-10-CM | POA: Diagnosis not present

## 2021-09-14 DIAGNOSIS — W01198A Fall on same level from slipping, tripping and stumbling with subsequent striking against other object, initial encounter: Secondary | ICD-10-CM | POA: Insufficient documentation

## 2021-09-14 DIAGNOSIS — Z96653 Presence of artificial knee joint, bilateral: Secondary | ICD-10-CM | POA: Insufficient documentation

## 2021-09-14 DIAGNOSIS — E1165 Type 2 diabetes mellitus with hyperglycemia: Secondary | ICD-10-CM | POA: Insufficient documentation

## 2021-09-14 DIAGNOSIS — W19XXXA Unspecified fall, initial encounter: Secondary | ICD-10-CM

## 2021-09-14 LAB — PROTIME-INR
INR: 1.1 (ref 0.8–1.2)
Prothrombin Time: 13.7 seconds (ref 11.4–15.2)

## 2021-09-14 LAB — CBC WITH DIFFERENTIAL/PLATELET
Abs Immature Granulocytes: 0.06 10*3/uL (ref 0.00–0.07)
Basophils Absolute: 0 10*3/uL (ref 0.0–0.1)
Basophils Relative: 0 %
Eosinophils Absolute: 0 10*3/uL (ref 0.0–0.5)
Eosinophils Relative: 0 %
HCT: 40.9 % (ref 36.0–46.0)
Hemoglobin: 12.5 g/dL (ref 12.0–15.0)
Immature Granulocytes: 0 %
Lymphocytes Relative: 9 %
Lymphs Abs: 1.1 10*3/uL (ref 0.7–4.0)
MCH: 30 pg (ref 26.0–34.0)
MCHC: 30.6 g/dL (ref 30.0–36.0)
MCV: 98.3 fL (ref 80.0–100.0)
Monocytes Absolute: 0.6 10*3/uL (ref 0.1–1.0)
Monocytes Relative: 5 %
Neutro Abs: 11.6 10*3/uL — ABNORMAL HIGH (ref 1.7–7.7)
Neutrophils Relative %: 86 %
Platelets: 228 10*3/uL (ref 150–400)
RBC: 4.16 MIL/uL (ref 3.87–5.11)
RDW: 13.4 % (ref 11.5–15.5)
WBC: 13.5 10*3/uL — ABNORMAL HIGH (ref 4.0–10.5)
nRBC: 0 % (ref 0.0–0.2)

## 2021-09-14 LAB — URINALYSIS, ROUTINE W REFLEX MICROSCOPIC
Bacteria, UA: NONE SEEN
Bilirubin Urine: NEGATIVE
Glucose, UA: >=500 mg/dL — AB
Hgb urine dipstick: NEGATIVE
Ketones, ur: NEGATIVE mg/dL
Leukocytes,Ua: NEGATIVE
Nitrite: NEGATIVE
Protein, ur: NEGATIVE mg/dL
Specific Gravity, Urine: 1.018 (ref 1.005–1.030)
pH: 6 (ref 5.0–8.0)

## 2021-09-14 LAB — COMPREHENSIVE METABOLIC PANEL
ALT: 15 U/L (ref 0–44)
AST: 35 U/L (ref 15–41)
Albumin: 3.7 g/dL (ref 3.5–5.0)
Alkaline Phosphatase: 70 U/L (ref 38–126)
Anion gap: 6 (ref 5–15)
BUN: 13 mg/dL (ref 8–23)
CO2: 33 mmol/L — ABNORMAL HIGH (ref 22–32)
Calcium: 9.4 mg/dL (ref 8.9–10.3)
Chloride: 99 mmol/L (ref 98–111)
Creatinine, Ser: 1.07 mg/dL — ABNORMAL HIGH (ref 0.44–1.00)
GFR, Estimated: 52 mL/min — ABNORMAL LOW (ref >60)
Glucose, Bld: 194 mg/dL — ABNORMAL HIGH (ref 70–99)
Potassium: 5.3 mmol/L — ABNORMAL HIGH (ref 3.5–5.1)
Sodium: 138 mmol/L (ref 135–145)
Total Bilirubin: 1.1 mg/dL (ref 0.3–1.2)
Total Protein: 6.8 g/dL (ref 6.5–8.1)

## 2021-09-14 LAB — DIGOXIN LEVEL: Digoxin Level: 1 ng/mL (ref 0.8–2.0)

## 2021-09-14 LAB — GLUCOSE, CAPILLARY: Glucose-Capillary: 202 mg/dL — ABNORMAL HIGH (ref 70–99)

## 2021-09-14 LAB — SARS CORONAVIRUS 2 BY RT PCR: SARS Coronavirus 2 by RT PCR: NEGATIVE

## 2021-09-14 MED ORDER — POLYETHYLENE GLYCOL 3350 17 G PO PACK
17.0000 g | PACK | Freq: Every day | ORAL | Status: DC | PRN
Start: 2021-09-14 — End: 2021-09-15

## 2021-09-14 MED ORDER — PROCHLORPERAZINE EDISYLATE 10 MG/2ML IJ SOLN: 10.0000 mg | Freq: Four times a day (QID) | INTRAMUSCULAR | Status: DC | PRN

## 2021-09-14 MED ORDER — ASPIRIN 81 MG PO TBEC
81.0000 mg | DELAYED_RELEASE_TABLET | Freq: Every day | ORAL | Status: DC
  Administered 2021-09-15: 81 mg via ORAL
  Filled 2021-09-14: qty 1

## 2021-09-14 MED ORDER — HYDROCODONE-ACETAMINOPHEN 5-325 MG PO TABS: 1.0000 | ORAL_TABLET | Freq: Four times a day (QID) | ORAL | Status: DC | PRN

## 2021-09-14 MED ORDER — SERTRALINE HCL 50 MG PO TABS
25.0000 mg | ORAL_TABLET | Freq: Every day | ORAL | Status: DC
  Administered 2021-09-14: 25 mg via ORAL
  Filled 2021-09-14 (×2): qty 1

## 2021-09-14 MED ORDER — MIRTAZAPINE 15 MG PO TABS
15.0000 mg | ORAL_TABLET | Freq: Every day | ORAL | Status: DC
  Administered 2021-09-14: 15 mg via ORAL
  Filled 2021-09-14 (×2): qty 1

## 2021-09-14 MED ORDER — ENOXAPARIN SODIUM 40 MG/0.4ML IJ SOSY
40.0000 mg | PREFILLED_SYRINGE | INTRAMUSCULAR | Status: DC
Start: 2021-09-14 — End: 2021-09-15
  Administered 2021-09-14: 40 mg via SUBCUTANEOUS
  Filled 2021-09-14: qty 0.4

## 2021-09-14 MED ORDER — PANTOPRAZOLE SODIUM 40 MG PO TBEC
40.0000 mg | DELAYED_RELEASE_TABLET | Freq: Every day | ORAL | Status: DC
  Administered 2021-09-15: 40 mg via ORAL
  Filled 2021-09-14: qty 1

## 2021-09-14 MED ORDER — ALBUTEROL SULFATE (2.5 MG/3ML) 0.083% IN NEBU
2.5000 mg | INHALATION_SOLUTION | RESPIRATORY_TRACT | Status: DC | PRN
Start: 2021-09-14 — End: 2021-09-15

## 2021-09-14 MED ORDER — PREDNISONE 10 MG PO TABS
10.0000 mg | ORAL_TABLET | Freq: Every day | ORAL | Status: DC
  Administered 2021-09-15: 10 mg via ORAL
  Filled 2021-09-14: qty 1

## 2021-09-14 MED ORDER — VITAMIN D 25 MCG (1000 UNIT) PO TABS
1000.0000 [IU] | ORAL_TABLET | Freq: Every day | ORAL | Status: DC
  Administered 2021-09-15: 1000 [IU] via ORAL
  Filled 2021-09-14: qty 1

## 2021-09-14 MED ORDER — INSULIN ASPART 100 UNIT/ML IJ SOLN
0.0000 [IU] | Freq: Every day | INTRAMUSCULAR | Status: DC
  Administered 2021-09-14: 2 [IU] via SUBCUTANEOUS
  Filled 2021-09-14: qty 1

## 2021-09-14 MED ORDER — MYCOPHENOLATE MOFETIL 250 MG PO CAPS
250.0000 mg | ORAL_CAPSULE | Freq: Two times a day (BID) | ORAL | Status: DC
  Administered 2021-09-14 – 2021-09-15 (×2): 250 mg via ORAL
  Filled 2021-09-14 (×2): qty 1

## 2021-09-14 MED ORDER — SULFAMETHOXAZOLE-TRIMETHOPRIM 400-80 MG PO TABS: 1.0000 | ORAL_TABLET | ORAL | Status: DC

## 2021-09-14 MED ORDER — SODIUM ZIRCONIUM CYCLOSILICATE 10 G PO PACK
10.0000 g | PACK | Freq: Once | ORAL | Status: AC
  Administered 2021-09-14: 10 g via ORAL
  Filled 2021-09-14: qty 1

## 2021-09-14 MED ORDER — ACETAMINOPHEN 325 MG PO TABS: 650.0000 mg | ORAL_TABLET | Freq: Four times a day (QID) | ORAL | Status: DC | PRN

## 2021-09-14 MED ORDER — ACETAMINOPHEN 500 MG PO TABS
1000.0000 mg | ORAL_TABLET | Freq: Once | ORAL | Status: AC
  Administered 2021-09-14: 1000 mg via ORAL
  Filled 2021-09-14: qty 2

## 2021-09-14 MED ORDER — HYDROMORPHONE HCL 1 MG/ML IJ SOLN: 0.5000 mg | INTRAMUSCULAR | Status: DC | PRN

## 2021-09-14 MED ORDER — GABAPENTIN 100 MG PO CAPS
100.0000 mg | ORAL_CAPSULE | Freq: Three times a day (TID) | ORAL | Status: DC
  Administered 2021-09-14 – 2021-09-15 (×2): 100 mg via ORAL
  Filled 2021-09-14 (×2): qty 1

## 2021-09-14 MED ORDER — FUROSEMIDE 20 MG PO TABS
20.0000 mg | ORAL_TABLET | Freq: Every day | ORAL | Status: DC
  Administered 2021-09-15: 20 mg via ORAL
  Filled 2021-09-14: qty 1

## 2021-09-14 MED ORDER — DIGOXIN 125 MCG PO TABS
0.1250 mg | ORAL_TABLET | Freq: Every day | ORAL | Status: DC
  Administered 2021-09-15: 0.125 mg via ORAL
  Filled 2021-09-14: qty 1

## 2021-09-14 MED ORDER — BUDESONIDE 0.25 MG/2ML IN SUSP: 0.2500 mg | Freq: Two times a day (BID) | RESPIRATORY_TRACT | Status: DC

## 2021-09-14 MED ORDER — BUDESONIDE 0.25 MG/2ML IN SUSP
0.2500 mg | Freq: Two times a day (BID) | RESPIRATORY_TRACT | Status: DC
Start: 2021-09-15 — End: 2021-09-15
  Administered 2021-09-15: 0.25 mg via RESPIRATORY_TRACT
  Filled 2021-09-14: qty 2

## 2021-09-14 MED ORDER — INSULIN ASPART 100 UNIT/ML IJ SOLN
0.0000 [IU] | Freq: Three times a day (TID) | INTRAMUSCULAR | Status: DC
  Administered 2021-09-15 (×2): 2 [IU] via SUBCUTANEOUS
  Filled 2021-09-14 (×2): qty 1

## 2021-09-14 MED ORDER — METOPROLOL SUCCINATE ER 25 MG PO TB24
12.5000 mg | ORAL_TABLET | Freq: Every day | ORAL | Status: DC
  Administered 2021-09-15: 12.5 mg via ORAL
  Filled 2021-09-14: qty 1

## 2021-09-14 MED ORDER — MELATONIN 5 MG PO TABS: 5.0000 mg | ORAL_TABLET | Freq: Every evening | ORAL | Status: DC | PRN

## 2021-09-14 NOTE — ED Notes (Addendum)
Pt given dinner tray, pt assisted with condiments.

## 2021-09-14 NOTE — Plan of Care (Signed)

## 2021-09-14 NOTE — ED Notes (Signed)
Pt presents to ED with c/o of R hip pain after a fall this morning. Pt states she tripped over her O2 tank tubing. Pt denies any other issues prior to fall. Pt denies dizziness prior to fall.   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 Pt denies numbness or tingling at this time.     R pedal pulse palpable at this time.   Purewick placed for comfort.

## 2021-09-14 NOTE — ED Provider Notes (Signed)
[facility] Provider Note    Event Date/Time   First MD Initiated Contact with Patient 09/14/21 1506     (approximate)   History   Fall   HPI  Sierra Guzman is a 81 y.o. female here with fall.  The patient states that she was trying to get around her house today when she tripped on her oxygen tubing.  She fell, landing onto her right side.  She reports immediate onset of right hip pain.  She has since been unable to ambulate due to this pain.  Pain is aching, throbbing.  She has a history of previous right hip replacement, but it has been multiple years since this.  Denies any distal numbness or weakness.  She does not believe she hit her head.  No loss of consciousness.  She states she was feeling fine prior to this fall.  No other complaints.  No recent medication changes.     Physical Exam   Triage Vital Signs: ED Triage Vitals [09/14/21 1234]  Enc Vitals Group     BP 130/75     Pulse Rate 96     Resp 18     Temp 98 F (36.7 C)     Temp src      SpO2 95 %     Weight      Height      Head Circumference      Peak Flow      Pain Score      Pain Loc      Pain Edu?      Excl. in GC?     Most recent vital signs: Vitals:   09/14/21 1600 09/14/21 1700  BP: (!) 148/78 137/84  Pulse: 90 93  Resp: 17 (!) 27  Temp:    SpO2: 100% 95%     General: Awake, no distress.  CV:  Good peripheral perfusion.  Regular rate and rhythm.  No murmurs or rubs. Resp:  Normal effort.  Lungs clear to auscultation bilaterally. Abd:  No distention.  No tenderness. Other:  Moderate tenderness over the right greater trochanter, particularly with logrolling.  No deformity.  No shortening.  Distal strength and sensation fully intact.  DP pulses 2+ and symmetric.   ED Results / Procedures / Treatments   Labs (all labs ordered are listed, but only abnormal results are displayed) Labs Reviewed  CBC WITH DIFFERENTIAL/PLATELET - Abnormal; Notable for the following  components:      Result Value   WBC 13.5 (*)    Neutro Abs 11.6 (*)    All other components within normal limits  COMPREHENSIVE METABOLIC PANEL - Abnormal; Notable for the following components:   Potassium 5.3 (*)    CO2 33 (*)    Glucose, Bld 194 (*)    Creatinine, Ser 1.07 (*)    GFR, Estimated 52 (*)    All other components within normal limits  SARS CORONAVIRUS 2 BY RT PCR  PROTIME-INR  URINALYSIS, ROUTINE W REFLEX MICROSCOPIC     EKG    RADIOLOGY CT head: No acute intracranial normality CT spine: No acute abnormality DG hip pelvis: Subtle irregularity of the lesser trochanter CT hip: Periprosthetic fracture   I also independently reviewed and agree with radiologist interpretations.   PROCEDURES:  Critical Care performed: No  .1-3 Lead EKG Interpretation Performed by: Shaune Pollack, MD Authorized by: Shaune Pollack, MD     Interpretation: normal     ECG rate:  80-95  ECG rate assessment: normal     Rhythm: sinus rhythm     Ectopy: none     Conduction: normal   Comments:     Indication: fall    MEDICATIONS ORDERED IN ED: Medications  acetaminophen (TYLENOL) tablet 1,000 mg (1,000 mg Oral Given 09/14/21 1706)     IMPRESSION / MDM / ASSESSMENT AND PLAN / ED COURSE  I reviewed the triage vital signs and the nursing notes.                               The patient is on the cardiac monitor to evaluate for evidence of arrhythmia and/or significant heart rate changes.   Ddx:  Differential includes the following, with pertinent life- or limb-threatening emergencies considered:  Mechanical fall with hip fracture, hip contusion, lumbar radiculopathy, generalized weakness from medical condition, occult UTI or pneumonia, anemia, dehydration  Patient's presentation is most consistent with acute illness / injury with system symptoms.  MDM:  81 year old female here with right hip pain after fall.  Patient was in her usual state of health prior to the  fall.  Reviewed records, patient has a history of diabetes, coronary disease, hyperlipidemia, anxiety, pulmonary fibrosis, prior hip replacement which she states was about 8 years ago.  On exam, patient is neurovascularly intact distally.  Initial plain film shows likely nondisplaced right periprosthetic fracture, which was confirmed on follow-up CT.  CT head and C-spine obtained given her fall and showed no evidence of acute abnormality.  CMP with mild hyperkalemia, possible slight dehydration but otherwise unremarkable.  CBC without significant anemia.  Mild leukocytosis may be due to hemoconcentration.  She has been given IV fluids.  No infectious symptoms.  Discussed the case with Dr. Allena Katz of orthopedics, who evaluated the patient.  Will admit for pain control.  Patient likely can be managed nonoperatively.  Discussed with the patient as well as her daughters via telephone.   MEDICATIONS GIVEN IN ED: Medications  acetaminophen (TYLENOL) tablet 1,000 mg (1,000 mg Oral Given 09/14/21 1706)     Consults:  Dr. Allena Katz, orthopedics Hospitalist   EMR reviewed  Admission for resp failure 3/23, prior ED visits     FINAL CLINICAL IMPRESSION(S) / ED DIAGNOSES   Final diagnoses:  None     Rx / DC Orders   ED Discharge Orders     None        Note:  This document was prepared using Dragon voice recognition software and may include unintentional dictation errors.   Shaune Pollack, MD 09/14/21 1729

## 2021-09-14 NOTE — ED Triage Notes (Addendum)
Pt comes via EMS from home with c/o trip and fall. Pt wears 2.5 til 6 depending on SOB. Pt states she fell on her side. Pt states it was her right side. Pt states lower back and around pain.  Pt given 50 mcg fent. Pt has 24 in right hand. Pt has no ambulated since fall. Pt states she tried to take few steps but didn't get far.  Shortened noted to left leg vs right

## 2021-09-14 NOTE — ED Provider Triage Note (Signed)
  Emergency Medicine Provider Triage Evaluation Note  Sierra Guzman , a 81 y.o.female,  was evaluated in triage.  Pt complains of injuries from fall.  She states that she fell on her right side after having this placed up.  Denies any preceding symptoms.  Currently endorsing stiffness in her neck and pain in her right hip.  She is unsure if she hit her head.   Review of Systems  Positive: Neck pain, right hip pain Negative: Denies fever, chest pain, vomiting  Physical Exam   Vitals:   09/14/21 1234  BP: 130/75  Pulse: 96  Resp: 18  Temp: 98 F (36.7 C)  SpO2: 95%   Gen:   Awake, no distress   Resp:  Normal effort  MSK:   Moves extremities without difficulty  Other:  Right lower extremity is slightly longer than the left.  No significant bony tenderness.  Medical Decision Making  Given the patient's initial medical screening exam, the following diagnostic evaluation has been ordered. The patient will be placed in the appropriate treatment space, once one is available, to complete the evaluation and treatment. I have discussed the plan of care with the patient and I have advised the patient that an ED physician or mid-level practitioner will reevaluate their condition after the test results have been received, as the results may give them additional insight into the type of treatment they may need.    Diagnostics: Right hip x-ray, head CT, cervical spine CT  Treatments: none immediately   Teodoro Spray, PA 09/14/21 1237

## 2021-09-14 NOTE — H&P (Addendum)
History and Physical  Sierra ANGELOTTI [id number] DOB: Oct 22, 1940 DOA: 09/14/2021  Referring physician: Dr. Ellender Hose, [place]. PCP: Wardell Honour, MD  Outpatient Specialists: Orthopedic surgery. Patient coming from: Home.  Chief Complaint: Tripped on oxygen tubing and fell.  HPI: Sierra Guzman is a 81 y.o. female with medical history significant for chronic anxiety/depression, hypertension, hyperlipidemia, type 2 diabetes, chronic hypoxia on 2-4 L nasal cannula continuously, who presented to [facility] ED from home after tripping on her oxygen tubing.  She was in her usual state of health prior to this.  EMS was activated.  She was brought into the ED for further evaluation.  Work-up in the ED revealed periprosthetic right hip fracture.  Seen by orthopedic surgery who recommended conservative management and pain control.  No plan for surgery.  The patient was admitted by hospitalist service, [facility].  ED Course: Tmax 98.  BP 127/111.  Pulse 97.  Respiratory 25, O2 saturation 94% on 3 L.  Lab studies remarkable for serum potassium 5.3, serum bicarb 33, glucose 194, BUN 13, creatinine 1.07, GFR 52.  WBC 13.5.  UA is pending.  Review of Systems: Review of systems as noted in the HPI. All other systems reviewed and are negative.   Past Medical History:  Diagnosis Date   Anemia 10/28/2010   unspecified   Anxiety    Aortic valve sclerosis    Arthritis    Asthma    Chronic combined systolic and diastolic CHF (congestive heart failure) ([place])    echo- 10/22 LVEF 45-55%, G1DD, moderately reduced RV fxn, mild aortic regurg/stenosis   Chronic kidney disease    Chronic Kidney Disease---Stage III   Coronary artery disease 10/28/2010   DDD (degenerative disc disease), lumbar 2006   Diabetes type 2, controlled ([place]) 10/28/2010   Dyspnea    GERD (gastroesophageal reflux disease)    Gout 10/28/2010   Heart murmur    History of cardiac catheterization 2006   LAD 50%, D1 50% rest normal.    History of  cardiovascular stress test 09/23/2009    lexiscan Normal Cardiolite test, EF 61%. LV size and function - normal   History of echocardiogram 05/27/2008   EF 55%; Mild aortic insufficiency with minimal aortic sclerosis. L vent normal.    Hyperlipidemia    Hyperlipidemia LDL goal <100 10/28/2010   managed by Dr. Dorena Bodo   Hypertension 10/28/2010   IPF (idiopathic pulmonary fibrosis) ([place])    Progressive, with UIP   Mild vitamin D deficiency 02/22/2011   Obesity (BMI 30-39.9) 10/28/2010   unspecified   Osteoarthritis 10/28/2010   bilateral knees, left hip   Personal history of colonic polyps 10/28/2010   Shortness of breath    Syncope 2013   Vitamin B 12 deficiency 10/28/2010   Past Surgical History:  Procedure Laterality Date   BACK SURGERY  2006   DDD   BREAST BIOPSY Right    neg   CARDIAC CATHETERIZATION  2006   EYE SURGERY Bilateral    Cataract Extraction with IOL   JOINT REPLACEMENT Left    Left Total Hip Replacement   JOINT REPLACEMENT     RIGHT 2005; LEFT 2 X 2004, 2011   REPLACEMENT TOTAL KNEE Left    REVISION TOTAL KNEE ARTHROPLASTY Left    Dr. [place] Right 12/12/2016   Procedure: TOTAL HIP ARTHROPLASTY ANTERIOR APPROACH;  Surgeon: Hessie Knows, MD;  Location: [facility] ORS;  Service: Orthopedics;  Laterality: Right;   TOTAL  HIP ARTHROPLASTY Left 12/26/2013   Procedure: ARTHROPLASTY HIP TOTAL; Surgeon: Zorita Pang, MD; Location: [place]; Service: Orthopedics; Laterality: left,    TOTAL KNEE ARTHROPLASTY     both knees    Social History:  reports that she quit smoking about 31 years ago. Her smoking use included cigarettes. She has a 2.50 pack-year smoking history. She has never used smokeless tobacco. She reports that she does not drink alcohol and does not use drugs.   Allergies  Allergen Reactions   Morphine And Related Nausea And Vomiting    Family History  Problem Relation Age of Onset   Breast cancer  Mother 68   Diabetes Mother    Stroke Mother    Stroke Brother    Breast cancer Cousin        pat cousin      Prior to Admission medications   Medication Sig Start Date End Date Taking? Authorizing Provider  acetaminophen (TYLENOL) 325 MG tablet Take 2 tablets (650 mg total) by mouth every 6 (six) hours as needed (pain). 01/17/21   Arrien, Jimmy Picket, MD  albuterol (PROVENTIL) (2.5 MG/3ML) 0.083% nebulizer solution Take 3 mLs (2.5 mg total) by nebulization every 2 (two) hours as needed for wheezing or shortness of breath. 03/10/21   Swayze, Ava, DO  ALPRAZolam (XANAX) 0.25 MG tablet Take 1 tablet (0.25 mg total) by mouth 3 (three) times daily as needed for anxiety. 06/17/21   Annita Brod, MD  aspirin EC 81 MG EC tablet Take 1 tablet (81 mg total) by mouth daily. Swallow whole. 01/18/21   Arrien, Jimmy Picket, MD  budesonide (PULMICORT) 0.25 MG/2ML nebulizer solution Take 2 mLs (0.25 mg total) by nebulization 2 (two) times daily. 03/10/21   Swayze, Ava, DO  cholecalciferol (VITAMIN D) 1000 units tablet Take 1,000 Units by mouth daily.    [provider]  digoxin (LANOXIN) 0.125 MG tablet Take 1 tablet (0.125 mg total) by mouth daily. 06/18/21   Annita Brod, MD  empagliflozin (JARDIANCE) 10 MG TABS tablet Take 1 tablet (10 mg total) by mouth daily. 06/18/21   Annita Brod, MD  Ensure Max Protein (ENSURE MAX PROTEIN) LIQD Take 330 mLs (11 oz total) by mouth at bedtime. 06/17/21   Annita Brod, MD  feeding supplement, GLUCERNA SHAKE, (GLUCERNA SHAKE) LIQD Take 237 mLs by mouth 2 (two) times daily between meals. 06/18/21   Annita Brod, MD  furosemide (LASIX) 20 MG tablet Take 1 tablet (20 mg total) by mouth daily. 06/18/21   Annita Brod, MD  lidocaine (LIDODERM) 5 % Place 1 patch onto the skin daily. Remove & Discard patch within 12 hours or as directed by MD 06/18/21   Annita Brod, MD  loratadine (CLARITIN) 10 MG tablet Take 10 mg by mouth  daily.    [provider]  magnesium oxide (MAG-OX) 400 MG tablet Take 1 tablet by mouth daily. 07/21/20   [provider]  metoprolol succinate (TOPROL-XL) 25 MG 24 hr tablet Take 12.5 mg by mouth daily.    [provider]  mirtazapine (REMERON) 15 MG tablet Take 15 mg by mouth at bedtime. 06/02/21   [provider]  mycophenolate (CELLCEPT) 250 MG capsule Take 1 capsule (250 mg total) by mouth 2 (two) times daily. 06/17/21   Annita Brod, MD  NAC 600 MG CAPS Take 600 mg by mouth 2 (two) times daily. 05/17/21   [provider]  Omega-3 Fatty Acids (FISH OIL) 1000  MG CAPS Take 1,000 mg by mouth daily.    [provider]  omeprazole (PRILOSEC) 20 MG capsule Take 20 mg by mouth daily. 11/05/20   [provider]  Pirfenidone 267 MG TABS Take 267 mg by mouth 3 (three) times daily.    [provider]  polyethylene glycol (MIRALAX / GLYCOLAX) 17 g packet Take 17 g by mouth daily. 12/19/20   Shelly Coss, MD  rosuvastatin (CRESTOR) 40 MG tablet Take 40 mg by mouth daily. 09/17/20   [provider]  sertraline (ZOLOFT) 25 MG tablet Take 25 mg by mouth daily. 06/03/21 06/03/22  [provider]  sodium chloride (OCEAN) 0.65 % SOLN nasal spray Place 2 sprays into both nostrils as needed for congestion. 03/10/21   Swayze, Ava, DO  enoxaparin (LOVENOX) 40 MG/0.4ML injection Inject 0.4 mLs (40 mg total) into the skin daily. 12/15/16 03/25/19  Duanne Guess, PA-C  pregabalin (LYRICA) 50 MG capsule Take 1 capsule (50 mg total) by mouth 3 (three) times daily. 12/18/16 03/25/19  Latanya Maudlin, NP  ranitidine (ZANTAC) 300 MG capsule Take 300 mg by mouth every evening.  03/25/19  [provider]  simvastatin (ZOCOR) 40 MG tablet Take 40 mg by mouth daily at 6 PM.  03/25/19  [provider]    Physical Exam: BP 137/84   Pulse 93   Temp 98 F (36.7 C)   Resp (!) 27   SpO2 95%   General: 81 y.o. year-old  female well developed well nourished in no acute distress.  Alert and oriented x3. Cardiovascular: Regular rate and rhythm with no rubs or gallops.  No thyromegaly or JVD noted.  No lower extremity edema. 2/4 pulses in all 4 extremities. Respiratory: Clear to auscultation with no wheezes or rales. Good inspiratory effort. Abdomen: Soft nontender nondistended with normal bowel sounds x4 quadrants. Muskuloskeletal: No cyanosis, clubbing or edema noted bilaterally Neuro: CN II-XII intact, strength, sensation, reflexes Skin: No ulcerative lesions noted or rashes Psychiatry: Judgement and insight appear normal. Mood is appropriate for condition and setting          Labs on Admission:  Basic Metabolic Panel: Recent Labs  Lab 09/14/21 1639  NA 138  K 5.3*  CL 99  CO2 33*  GLUCOSE 194*  BUN 13  CREATININE 1.07*  CALCIUM 9.4   Liver Function Tests: Recent Labs  Lab 09/14/21 1639  AST 35  ALT 15  ALKPHOS 70  BILITOT 1.1  PROT 6.8  ALBUMIN 3.7   No results for input(s): LIPASE, AMYLASE in the last 168 hours. No results for input(s): AMMONIA in the last 168 hours. CBC: Recent Labs  Lab 09/14/21 1639  WBC 13.5*  NEUTROABS 11.6*  HGB 12.5  HCT 40.9  MCV 98.3  PLT 228   Cardiac Enzymes: No results for input(s): CKTOTAL, CKMB, CKMBINDEX, TROPONINI in the last 168 hours.  BNP (last 3 results) Recent Labs    05/09/21 1032 06/10/21 2011 06/11/21 0509  BNP 234.9* 563.3* 662.9*    ProBNP (last 3 results) No results for input(s): PROBNP in the last 8760 hours.  CBG: No results for input(s): GLUCAP in the last 168 hours.  Radiological Exams on Admission: CT Head Wo Contrast  Result Date: 09/14/2021 CLINICAL DATA:  Head trauma, moderate-severe; Neck trauma (Age >= 65y) EXAM: CT HEAD WITHOUT CONTRAST CT CERVICAL SPINE WITHOUT CONTRAST TECHNIQUE: Multidetector CT imaging of the head and cervical spine was performed following the standard protocol without intravenous  contrast. Multiplanar CT  image reconstructions of the cervical spine were also generated. RADIATION DOSE REDUCTION: This exam was performed according to the departmental dose-optimization program which includes automated exposure control, adjustment of the mA and/or kV according to patient size and/or use of iterative reconstruction technique. COMPARISON:  None Available. FINDINGS: CT HEAD FINDINGS Brain: No evidence of acute infarction, hemorrhage, hydrocephalus, extra-axial collection or mass lesion/mass effect. Patchy white matter hypodensities, nonspecific but compatible with chronic microvascular ischemic disease. Cerebral atrophy, mild for age. Vascular: Calcific intracranial atherosclerosis. No hyperdense vessel identified. Skull: No acute fracture. Sinuses/Orbits: Clear sinuses.  No acute orbital findings. Other: No mastoid effusions. CT CERVICAL SPINE FINDINGS Alignment: Mild reversal the normal cervical lordosis. No substantial sagittal subluxation. Skull base and vertebrae: Vertebral body heights are maintained. No evidence of acute fracture. Diffuse osteopenia. Soft tissues and spinal canal: No prevertebral fluid or swelling. No visible canal hematoma. Disc levels: Multilevel degenerative disc disease as well as facet arthropathy with fusion across the left C4-C5 facet joint. Upper chest: Partially imaged biapical pleuroparenchymal scarring. IMPRESSION: 1. No evidence of acute intracranial abnormality. 2. No evidence of acute fracture or traumatic malalignment in the cervical spine. Electronically Signed   By: Margaretha Sheffield M.D.   On: 09/14/2021 13:06   CT Cervical Spine Wo Contrast  Result Date: 09/14/2021 CLINICAL DATA:  Head trauma, moderate-severe; Neck trauma (Age >= 65y) EXAM: CT HEAD WITHOUT CONTRAST CT CERVICAL SPINE WITHOUT CONTRAST TECHNIQUE: Multidetector CT imaging of the head and cervical spine was performed following the standard protocol without intravenous contrast. Multiplanar CT  image reconstructions of the cervical spine were also generated. RADIATION DOSE REDUCTION: This exam was performed according to the departmental dose-optimization program which includes automated exposure control, adjustment of the mA and/or kV according to patient size and/or use of iterative reconstruction technique. COMPARISON:  None Available. FINDINGS: CT HEAD FINDINGS Brain: No evidence of acute infarction, hemorrhage, hydrocephalus, extra-axial collection or mass lesion/mass effect. Patchy white matter hypodensities, nonspecific but compatible with chronic microvascular ischemic disease. Cerebral atrophy, mild for age. Vascular: Calcific intracranial atherosclerosis. No hyperdense vessel identified. Skull: No acute fracture. Sinuses/Orbits: Clear sinuses.  No acute orbital findings. Other: No mastoid effusions. CT CERVICAL SPINE FINDINGS Alignment: Mild reversal the normal cervical lordosis. No substantial sagittal subluxation. Skull base and vertebrae: Vertebral body heights are maintained. No evidence of acute fracture. Diffuse osteopenia. Soft tissues and spinal canal: No prevertebral fluid or swelling. No visible canal hematoma. Disc levels: Multilevel degenerative disc disease as well as facet arthropathy with fusion across the left C4-C5 facet joint. Upper chest: Partially imaged biapical pleuroparenchymal scarring. IMPRESSION: 1. No evidence of acute intracranial abnormality. 2. No evidence of acute fracture or traumatic malalignment in the cervical spine. Electronically Signed   By: Margaretha Sheffield M.D.   On: 09/14/2021 13:06   CT Hip Right Wo Contrast  Result Date: 09/14/2021 CLINICAL DATA:  Hip trauma, fracture suspected, xray done EXAM: CT OF THE RIGHT HIP WITHOUT CONTRAST TECHNIQUE: Multidetector CT imaging of the right hip was performed according to the standard protocol. Metal artifact reduction protocol utilized. Multiplanar CT image reconstructions were also generated. RADIATION DOSE  REDUCTION: This exam was performed according to the departmental dose-optimization program which includes automated exposure control, adjustment of the mA and/or kV according to patient size and/or use of iterative reconstruction technique. 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 Acute nondisplaced periprosthetic fracture involving the intertrochanteric aspect of the proximal right femur (series 2, images 57-68). Arthroplasty components  are in their expected alignment without dislocation. No evidence to suggest hardware loosening. Included right hemipelvis intact. Ligaments Suboptimally assessed by CT. Muscles and Tendons No acute musculotendinous abnormality by CT. Soft tissues No hematoma or periprosthetic fluid collection. No right inguinal lymphadenopathy. Atherosclerotic vascular calcifications are seen. Colonic diverticulosis. IMPRESSION: Postsurgical changes from right total hip arthroplasty with acute nondisplaced periprosthetic fracture involving the intertrochanteric aspect of the proximal right femur. Electronically Signed   By: Davina Poke D.O.   On: 09/14/2021 14:29   DG Hip Unilat With Pelvis 2-3 Views Right  Result Date: 09/14/2021 CLINICAL DATA:  Fall.  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 Arthroplasty components appear in their expected alignment. Subtle cortical irregularity of the lesser trochanter, which could reflect enthesopathic change, although a nondisplaced fracture is not excluded. No periprosthetic lucency is seen. Bones are diffusely demineralized. Extensive atherosclerotic vascular calcifications. IMPRESSION: Subtle cortical irregularity of the lesser trochanter, which could reflect enthesopathic change, although a nondisplaced fracture is not excluded. Correlate with point tenderness and  consider further evaluation with metal artifact reduction protocol CT of the hip if there is high clinical suspicion for fracture. Electronically Signed   By: Davina Poke D.O.   On: 09/14/2021 13:33    EKG: I independently viewed the EKG done and my findings are as followed: None available at the time of the visit.  Ordered.  Assessment/Plan Present on Admission: **None**  Principal Problem:   Periprosthetic hip fracture, initial encounter  Periprosthetic right hip fracture, secondary to mechanical fall Tripped on her oxygen tubing at home Seen by orthopedic surgery, recommends conservative management no surgery is planned. Pain control and bowel regimen Fall precautions TOC consulted for DC planning  Hyperkalemia Serum potassium of 5.3 Treated with 1 dose of Lokelma 10 g x 1 Repeat chemistry panel in the morning  Type 2 diabetes with hyperglycemia Last hemoglobin A1c 5.9 on 0 06/11/2021 from 8.1 Start insulin sliding scale  Chronic combined diastolic and systolic CHF Euvolemic on exam Last 2D echo done on 06/11/2021 showed LVEF 30 to 35% and grade 1 diastolic dysfunction Holding off IV fluid to avoid volume overload Start strict I's and O's and daily weight Resume home regimen  Multifocal atrial tachycardia Resume home regimen She is on digoxin at home, obtaining dig level. Twelve-lead EKG is pending, follow, also evaluate QTc  Interstitial lung disease/idiopathic pulmonary fibrosis Chronic hypoxia on 2 to 4 L nasal cannula at baseline Resume home regimen  Essential hypertension Resume home oral antihypertensives  Chronic anxiety/depression Resume home regimen    DVT prophylaxis: Subcu Lovenox daily  Code Status: DNR per the patient and daughter who is RN via phone.  Family Communication: None at present  Disposition Plan: Admitted to telemetry surgical unit  Consults called: Orthopedic surgery  Admission status: Inpatient status   Status is:  Inpatient The patient requires at least 2 midnight for further evaluation and treatment of present condition.   Kayleen Memos MD [facility] Pager [phone]  If 7PM-7AM, please contact night-coverage [email or web address] Password [facility]  09/14/2021, 5:42 PM

## 2021-09-14 NOTE — Consult Note (Signed)
ORTHOPAEDIC CONSULTATION  REQUESTING PHYSICIAN: Duffy Bruce, MD  Chief Complaint:   Right hip pain  History of Present Illness: Sierra Guzman is a 81 y.o. female who had a fall this morning after she tripped over her O2 tank tubing and fell on her right hip.  She states that she was able to ambulate a short distance before she started having much more significant right hip pain.  She has had prior bilateral total hip arthroplasties.  She lives at home with her daughter, and there is a full-time caregiver and her daughter is not present.  She has a significant medical history with chronic kidney disease, diabetes, and interstitial lung disease with asthma requiring supplemental oxygen.  Past Medical History:  Diagnosis Date   Anemia 10/28/2010   unspecified   Anxiety    Aortic valve sclerosis    Arthritis    Asthma    Chronic combined systolic and diastolic CHF (congestive heart failure) ([place])    echo- 10/22 LVEF 45-55%, G1DD, moderately reduced RV fxn, mild aortic regurg/stenosis   Chronic kidney disease    Chronic Kidney Disease---Stage III   Coronary artery disease 10/28/2010   DDD (degenerative disc disease), lumbar 2006   Diabetes type 2, controlled ([place]) 10/28/2010   Dyspnea    GERD (gastroesophageal reflux disease)    Gout 10/28/2010   Heart murmur    History of cardiac catheterization 2006   LAD 50%, D1 50% rest normal.    History of cardiovascular stress test 09/23/2009    lexiscan Normal Cardiolite test, EF 61%. LV size and function - normal   History of echocardiogram 05/27/2008   EF 55%; Mild aortic insufficiency with minimal aortic sclerosis. L vent normal.    Hyperlipidemia    Hyperlipidemia LDL goal <100 10/28/2010   managed by Dr. Dorena Bodo   Hypertension 10/28/2010   IPF (idiopathic pulmonary fibrosis) ([place])    Progressive, with UIP   Mild vitamin D deficiency 02/22/2011   Obesity (BMI 30-39.9)  10/28/2010   unspecified   Osteoarthritis 10/28/2010   bilateral knees, left hip   Personal history of colonic polyps 10/28/2010   Shortness of breath    Syncope 2013   Vitamin B 12 deficiency 10/28/2010   Past Surgical History:  Procedure Laterality Date   BACK SURGERY  2006   DDD   BREAST BIOPSY Right    neg   CARDIAC CATHETERIZATION  2006   EYE SURGERY Bilateral    Cataract Extraction with IOL   JOINT REPLACEMENT Left    Left Total Hip Replacement   JOINT REPLACEMENT     RIGHT 2005; LEFT 2 X 2004, 2011   REPLACEMENT TOTAL KNEE Left    REVISION TOTAL KNEE ARTHROPLASTY Left    Dr. Tenafly Right 12/12/2016   Procedure: TOTAL HIP ARTHROPLASTY ANTERIOR APPROACH;  Surgeon: Hessie Knows, MD;  Location: [facility];  Service: Orthopedics;  Laterality: Right;   TOTAL HIP ARTHROPLASTY Left 12/26/2013   Procedure: ARTHROPLASTY HIP TOTAL; Surgeon: Zorita Pang, MD; Location: [place]; Service: Orthopedics; Laterality: left,    TOTAL KNEE ARTHROPLASTY     both knees   Social History   Socioeconomic History   Marital status: Married    Spouse name: Not on file   Number of children: Not on file   Years of education: Not on file   Highest education level: Not on file  Occupational History   Not on file  Tobacco Use  Smoking status: Former    Packs/day: 0.25    Years: 10.00    Pack years: 2.50    Types: Cigarettes    Quit date: 08/18/1990    Years since quitting: 31.0   Smokeless tobacco: Never  Vaping Use   Vaping Use: Never used  Substance and Sexual Activity   Alcohol use: No   Drug use: No   Sexual activity: Not Currently  Other Topics Concern   Not on file  Social History Narrative   Not on file   Social Determinants of Health   Financial Resource Strain: Not on file  Food Insecurity: Not on file  Transportation Needs: Not on file  Physical Activity: Not on file  Stress: Not on file  Social Connections: Not on  file   Family History  Problem Relation Age of Onset   Breast cancer Mother 74   Diabetes Mother    Stroke Mother    Stroke Brother    Breast cancer Cousin        pat cousin   Allergies  Allergen Reactions   Morphine And Related Nausea And Vomiting   Prior to Admission medications   Medication Sig Start Date End Date Taking? Authorizing Provider  acetaminophen (TYLENOL) 325 MG tablet Take 2 tablets (650 mg total) by mouth every 6 (six) hours as needed (pain). 01/17/21   Arrien, Jimmy Picket, MD  albuterol (PROVENTIL) (2.5 MG/3ML) 0.083% nebulizer solution Take 3 mLs (2.5 mg total) by nebulization every 2 (two) hours as needed for wheezing or shortness of breath. 03/10/21   Swayze, Ava, DO  ALPRAZolam (XANAX) 0.25 MG tablet Take 1 tablet (0.25 mg total) by mouth 3 (three) times daily as needed for anxiety. 06/17/21   Annita Brod, MD  aspirin EC 81 MG EC tablet Take 1 tablet (81 mg total) by mouth daily. Swallow whole. 01/18/21   Arrien, Jimmy Picket, MD  budesonide (PULMICORT) 0.25 MG/2ML nebulizer solution Take 2 mLs (0.25 mg total) by nebulization 2 (two) times daily. 03/10/21   Swayze, Ava, DO  cholecalciferol (VITAMIN D) 1000 units tablet Take 1,000 Units by mouth daily.    [provider]  digoxin (LANOXIN) 0.125 MG tablet Take 1 tablet (0.125 mg total) by mouth daily. 06/18/21   Annita Brod, MD  empagliflozin (JARDIANCE) 10 MG TABS tablet Take 1 tablet (10 mg total) by mouth daily. 06/18/21   Annita Brod, MD  Ensure Max Protein (ENSURE MAX PROTEIN) LIQD Take 330 mLs (11 oz total) by mouth at bedtime. 06/17/21   Annita Brod, MD  feeding supplement, GLUCERNA SHAKE, (GLUCERNA SHAKE) LIQD Take 237 mLs by mouth 2 (two) times daily between meals. 06/18/21   Annita Brod, MD  furosemide (LASIX) 20 MG tablet Take 1 tablet (20 mg total) by mouth daily. 06/18/21   Annita Brod, MD  lidocaine (LIDODERM) 5 % Place 1 patch onto the skin daily. Remove  & Discard patch within 12 hours or as directed by MD 06/18/21   Annita Brod, MD  loratadine (CLARITIN) 10 MG tablet Take 10 mg by mouth daily.    [provider]  magnesium oxide (MAG-OX) 400 MG tablet Take 1 tablet by mouth daily. 07/21/20   [provider]  metoprolol succinate (TOPROL-XL) 25 MG 24 hr tablet Take 12.5 mg by mouth daily.    [provider]  mirtazapine (REMERON) 15 MG tablet Take 15 mg by mouth at bedtime. 06/02/21   [provider]  mycophenolate (CELLCEPT)  250 MG capsule Take 1 capsule (250 mg total) by mouth 2 (two) times daily. 06/17/21   Annita Brod, MD  NAC 600 MG CAPS Take 600 mg by mouth 2 (two) times daily. 05/17/21   [provider]  Omega-3 Fatty Acids (FISH OIL) 1000 MG CAPS Take 1,000 mg by mouth daily.    [provider]  omeprazole (PRILOSEC) 20 MG capsule Take 20 mg by mouth daily. 11/05/20   [provider]  Pirfenidone 267 MG TABS Take 267 mg by mouth 3 (three) times daily.    [provider]  polyethylene glycol (MIRALAX / GLYCOLAX) 17 g packet Take 17 g by mouth daily. 12/19/20   Shelly Coss, MD  rosuvastatin (CRESTOR) 40 MG tablet Take 40 mg by mouth daily. 09/17/20   [provider]  sertraline (ZOLOFT) 25 MG tablet Take 25 mg by mouth daily. 06/03/21 06/03/22  [provider]  sodium chloride (OCEAN) 0.65 % SOLN nasal spray Place 2 sprays into both nostrils as needed for congestion. 03/10/21   Swayze, Ava, DO  enoxaparin (LOVENOX) 40 MG/0.4ML injection Inject 0.4 mLs (40 mg total) into the skin daily. 12/15/16 03/25/19  Duanne Guess, PA-C  pregabalin (LYRICA) 50 MG capsule Take 1 capsule (50 mg total) by mouth 3 (three) times daily. 12/18/16 03/25/19  Latanya Maudlin, NP  ranitidine (ZANTAC) 300 MG capsule Take 300 mg by mouth every evening.  03/25/19  [provider]  simvastatin (ZOCOR) 40 MG tablet Take 40 mg by mouth daily at 6 PM.  03/25/19   [provider]   No results for input(s): WBC, HGB, HCT, PLT, K, CL, CO2, BUN, CREATININE, GLUCOSE, CALCIUM, LABPT, INR in the last 72 hours. CT Head Wo Contrast  Result Date: 09/14/2021 CLINICAL DATA:  Head trauma, moderate-severe; Neck trauma (Age >= 65y) EXAM: CT HEAD WITHOUT CONTRAST CT CERVICAL SPINE WITHOUT CONTRAST TECHNIQUE: Multidetector CT imaging of the head and cervical spine was performed following the standard protocol without intravenous contrast. Multiplanar CT image reconstructions of the cervical spine were also generated. RADIATION DOSE REDUCTION: This exam was performed according to the departmental dose-optimization program which includes automated exposure control, adjustment of the mA and/or kV according to patient size and/or use of iterative reconstruction technique. COMPARISON:  None Available. FINDINGS: CT HEAD FINDINGS Brain: No evidence of acute infarction, hemorrhage, hydrocephalus, extra-axial collection or mass lesion/mass effect. Patchy white matter hypodensities, nonspecific but compatible with chronic microvascular ischemic disease. Cerebral atrophy, mild for age. Vascular: Calcific intracranial atherosclerosis. No hyperdense vessel identified. Skull: No acute fracture. Sinuses/Orbits: Clear sinuses.  No acute orbital findings. Other: No mastoid effusions. CT CERVICAL SPINE FINDINGS Alignment: Mild reversal the normal cervical lordosis. No substantial sagittal subluxation. Skull base and vertebrae: Vertebral body heights are maintained. No evidence of acute fracture. Diffuse osteopenia. Soft tissues and spinal canal: No prevertebral fluid or swelling. No visible canal hematoma. Disc levels: Multilevel degenerative disc disease as well as facet arthropathy with fusion across the left C4-C5 facet joint. Upper chest: Partially imaged biapical pleuroparenchymal scarring. IMPRESSION: 1. No evidence of acute intracranial abnormality. 2. No evidence of acute fracture or  traumatic malalignment in the cervical spine. Electronically Signed   By: Margaretha Sheffield M.D.   On: 09/14/2021 13:06   CT Cervical Spine Wo Contrast  Result Date: 09/14/2021 CLINICAL DATA:  Head trauma, moderate-severe; Neck trauma (Age >= 65y) EXAM: CT HEAD WITHOUT CONTRAST CT CERVICAL SPINE WITHOUT CONTRAST TECHNIQUE: Multidetector CT imaging of the head and cervical  spine was performed following the standard protocol without intravenous contrast. Multiplanar CT image reconstructions of the cervical spine were also generated. RADIATION DOSE REDUCTION: This exam was performed according to the departmental dose-optimization program which includes automated exposure control, adjustment of the mA and/or kV according to patient size and/or use of iterative reconstruction technique. COMPARISON:  None Available. FINDINGS: CT HEAD FINDINGS Brain: No evidence of acute infarction, hemorrhage, hydrocephalus, extra-axial collection or mass lesion/mass effect. Patchy white matter hypodensities, nonspecific but compatible with chronic microvascular ischemic disease. Cerebral atrophy, mild for age. Vascular: Calcific intracranial atherosclerosis. No hyperdense vessel identified. Skull: No acute fracture. Sinuses/Orbits: Clear sinuses.  No acute orbital findings. Other: No mastoid effusions. CT CERVICAL SPINE FINDINGS Alignment: Mild reversal the normal cervical lordosis. No substantial sagittal subluxation. Skull base and vertebrae: Vertebral body heights are maintained. No evidence of acute fracture. Diffuse osteopenia. Soft tissues and spinal canal: No prevertebral fluid or swelling. No visible canal hematoma. Disc levels: Multilevel degenerative disc disease as well as facet arthropathy with fusion across the left C4-C5 facet joint. Upper chest: Partially imaged biapical pleuroparenchymal scarring. IMPRESSION: 1. No evidence of acute intracranial abnormality. 2. No evidence of acute fracture or traumatic malalignment in  the cervical spine. Electronically Signed   By: Margaretha Sheffield M.D.   On: 09/14/2021 13:06   CT Hip Right Wo Contrast  Result Date: 09/14/2021 CLINICAL DATA:  Hip trauma, fracture suspected, xray done EXAM: CT OF THE RIGHT HIP WITHOUT CONTRAST TECHNIQUE: Multidetector CT imaging of the right hip was performed according to the standard protocol. Metal artifact reduction protocol utilized. Multiplanar CT image reconstructions were also generated. RADIATION DOSE REDUCTION: This exam was performed according to the departmental dose-optimization program which includes automated exposure control, adjustment of the mA and/or kV according to patient size and/or use of iterative reconstruction technique. 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 Acute nondisplaced periprosthetic fracture involving the intertrochanteric aspect of the proximal right femur (series 2, images 57-68). Arthroplasty components are in their expected alignment without dislocation. No evidence to suggest hardware loosening. Included right hemipelvis intact. Ligaments Suboptimally assessed by CT. Muscles and Tendons No acute musculotendinous abnormality by CT. Soft tissues No hematoma or periprosthetic fluid collection. No right inguinal lymphadenopathy. Atherosclerotic vascular calcifications are seen. Colonic diverticulosis. IMPRESSION: Postsurgical changes from right total hip arthroplasty with acute nondisplaced periprosthetic fracture involving the intertrochanteric aspect of the proximal right femur. Electronically Signed   By: Davina Poke D.O.   On: 09/14/2021 14:29   DG Hip Unilat With Pelvis 2-3 Views Right  Result Date: 09/14/2021 CLINICAL DATA:  Fall.  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 Arthroplasty components appear in their expected  alignment. Subtle cortical irregularity of the lesser trochanter, which could reflect enthesopathic change, although a nondisplaced fracture is not excluded. No periprosthetic lucency is seen. Bones are diffusely demineralized. Extensive atherosclerotic vascular calcifications. IMPRESSION: Subtle cortical irregularity of the lesser trochanter, which could reflect enthesopathic change, although a nondisplaced fracture is not excluded. Correlate with point tenderness and consider further evaluation with metal artifact reduction protocol CT of the hip if there is high clinical suspicion for fracture. Electronically Signed   By: Davina Poke D.O.   On: 09/14/2021 13:33     Positive ROS: All other systems have been reviewed and were otherwise negative with the exception of those mentioned in the HPI and as  above.  Physical Exam: BP (!) 148/78   Pulse 90   Temp 98 F (36.7 C)   Resp 17   SpO2 100%  General:  Alert, no acute distress Psychiatric:  Patient is competent for consent with normal mood and affect   Cardiovascular:  No pedal edema, regular rate and rhythm Respiratory:  No wheezing, non-labored breathing GI:  Abdomen is soft and non-tender Skin:  No lesions in the area of chief complaint, no erythema Neurologic:  Sensation intact distally, CN grossly intact Lymphatic:  No axillary or cervical lymphadenopathy  Orthopedic Exam:  RLE: 5/5 DF/PF/EHL SILT grossly over foot Foot wwp RoM hip: 80 degrees flexion without significant pain; mild groin pain with IR to 10 degrees and ER to 20 degrees   X-rays/CT scan:  As above: Essentially nondisplaced periprosthetic fracture around the stem of prior THA on the right side  Assessment/Plan: 81 year old female with essentially nondisplaced right periprosthetic fracture.  Femoral stem appears to be stable. 1.  No plan for surgical intervention at this time.  Recommend weightbearing as tolerated.  2.  PT/OT for mobilization.  Did discuss  disposition with patient and her daughter (who was on the phone).  We discussed that given that she had full-time support at home, if she was able to mobilize appropriately with physical therapy, she may be able to be discharged home.  Otherwise we would consider SNF placement.  3.  We will plan to follow peripherally while inpatient.  Patient can follow-up with Dr. Rudene Christians at [facility] in approximately 3 weeks.  Please page with any questions in the meantime.    Leim Fabry   09/14/2021 4:52 PM

## 2021-09-14 NOTE — ED Notes (Signed)
Informed RN bed assigned 

## 2021-09-15 DIAGNOSIS — M978XXA Periprosthetic fracture around other internal prosthetic joint, initial encounter: Secondary | ICD-10-CM | POA: Diagnosis not present

## 2021-09-15 DIAGNOSIS — Z96649 Presence of unspecified artificial hip joint: Secondary | ICD-10-CM | POA: Diagnosis not present

## 2021-09-15 LAB — CBC WITH DIFFERENTIAL/PLATELET
Abs Immature Granulocytes: 0.06 10*3/uL (ref 0.00–0.07)
Basophils Absolute: 0 10*3/uL (ref 0.0–0.1)
Basophils Relative: 0 %
Eosinophils Absolute: 0.3 10*3/uL (ref 0.0–0.5)
Eosinophils Relative: 3 %
HCT: 34.4 % — ABNORMAL LOW (ref 36.0–46.0)
Hemoglobin: 10.6 g/dL — ABNORMAL LOW (ref 12.0–15.0)
Immature Granulocytes: 1 %
Lymphocytes Relative: 23 %
Lymphs Abs: 2.4 10*3/uL (ref 0.7–4.0)
MCH: 29.9 pg (ref 26.0–34.0)
MCHC: 30.8 g/dL (ref 30.0–36.0)
MCV: 97.2 fL (ref 80.0–100.0)
Monocytes Absolute: 0.9 10*3/uL (ref 0.1–1.0)
Monocytes Relative: 9 %
Neutro Abs: 6.7 10*3/uL (ref 1.7–7.7)
Neutrophils Relative %: 64 %
Platelets: 198 10*3/uL (ref 150–400)
RBC: 3.54 MIL/uL — ABNORMAL LOW (ref 3.87–5.11)
RDW: 13.2 % (ref 11.5–15.5)
WBC: 10.3 10*3/uL (ref 4.0–10.5)
nRBC: 0 % (ref 0.0–0.2)

## 2021-09-15 LAB — COMPREHENSIVE METABOLIC PANEL
ALT: 10 U/L (ref 0–44)
AST: 20 U/L (ref 15–41)
Albumin: 3.1 g/dL — ABNORMAL LOW (ref 3.5–5.0)
Alkaline Phosphatase: 55 U/L (ref 38–126)
Anion gap: 6 (ref 5–15)
BUN: 15 mg/dL (ref 8–23)
CO2: 31 mmol/L (ref 22–32)
Calcium: 9 mg/dL (ref 8.9–10.3)
Chloride: 102 mmol/L (ref 98–111)
Creatinine, Ser: 0.84 mg/dL (ref 0.44–1.00)
GFR, Estimated: >60 mL/min (ref >60)
Glucose, Bld: 148 mg/dL — ABNORMAL HIGH (ref 70–99)
Potassium: 3.6 mmol/L (ref 3.5–5.1)
Sodium: 139 mmol/L (ref 135–145)
Total Bilirubin: 0.3 mg/dL (ref 0.3–1.2)
Total Protein: 5.7 g/dL — ABNORMAL LOW (ref 6.5–8.1)

## 2021-09-15 LAB — GLUCOSE, CAPILLARY
Glucose-Capillary: 154 mg/dL — ABNORMAL HIGH (ref 70–99)
Glucose-Capillary: 151 mg/dL — ABNORMAL HIGH (ref 70–99)

## 2021-09-15 LAB — PHOSPHORUS: Phosphorus: 4 mg/dL (ref 2.5–4.6)

## 2021-09-15 LAB — MAGNESIUM: Magnesium: 2.3 mg/dL (ref 1.7–2.4)

## 2021-09-15 MED ORDER — PIRFENIDONE 267 MG PO TABS: 267.0000 mg | ORAL_TABLET | Freq: Three times a day (TID) | ORAL | Status: DC

## 2021-09-15 MED ORDER — HYDROCODONE-ACETAMINOPHEN 5-325 MG PO TABS: 1.0000 | ORAL_TABLET | Freq: Four times a day (QID) | ORAL | 0 refills | Status: AC | PRN

## 2021-09-15 MED ORDER — OMEGA-3-ACID ETHYL ESTERS 1 G PO CAPS
1.0000 g | ORAL_CAPSULE | Freq: Every day | ORAL | Status: DC
  Administered 2021-09-15: 1 g via ORAL
  Filled 2021-09-15: qty 1

## 2021-09-15 NOTE — Progress Notes (Signed)
Met with the patient she is on hospice at home with [facility] She will have Hospice arrange any HH she needs She has oxygen and DME at home Her daughter will be picking her up and will have the local fire department take the patient into the home She stated that is how they get her out of the home as well, she will be here at 3 PM to pick her up

## 2021-09-15 NOTE — Evaluation (Addendum)
Physical Therapy Evaluation Patient Details Name: Sierra Guzman MRN: [id number] DOB: Aug 18, 1940 Today's Date: 09/15/2021  History of Present Illness  presented to ER secondary to mechanical fall (tripped over O2 cord); admitted for management of R LE peri-prosthetic hip fracture, managed conservatively, WBAT  Clinical Impression  Patient resting in bed upon arrival to room, RN at bedside for assessment.  Patient alert and oriented, follows commands and agreeable to participation with session; eager for discharge home with continued support from daughter/caregiver.  Endorses mild pain to R hip, FACES 2/10.  Able to complete bed mobility with min assist; sit/stand, standing balance and bed/chair transfer with RW.  Heavy use of UEs with transfer; fair/good tolerance for R LE loading with standing and transfer. Significant SOB with minimal exertion (requiring increase to 6L with activity, returned to 3L at rest), sats 90-93% on 6L.  Does require significant activity pacing and frequent, prolonged rest periods with activity.  Patient declined additional gait/activity at this time, requesting additional rest period for recovery.  Will plan for follow up session in PM to assess gait and stairs (has 4 to enter home). Would benefit from skilled PT to address above deficits and promote optimal return to PLOF.; Recommend transition to HHPT upon discharge from acute hospitalization.      Recommendations for follow up therapy are one component of a multi-disciplinary discharge planning process, led by the attending physician.  Recommendations may be updated based on patient status, additional functional criteria and insurance authorization.  Follow Up Recommendations Home health PT    Assistance Recommended at Discharge PRN  Patient can return home with the following  A little help with walking and/or transfers;A little help with bathing/dressing/bathroom    Equipment Recommendations  (has RW, rollator,  hospital bed, WC)  Recommendations for Other Services       Functional Status Assessment Patient has had a recent decline in their functional status and demonstrates the ability to make significant improvements in function in a reasonable and predictable amount of time.     Precautions / Restrictions Precautions Precautions: Fall Restrictions Weight Bearing Restrictions: Yes RLE Weight Bearing: Weight bearing as tolerated      Mobility  Bed Mobility Overal bed mobility: Needs Assistance Bed Mobility: Supine to Sit     Supine to sit: Min assist          Transfers Overall transfer level: Needs assistance Equipment used: Rolling walker (2 wheels) Transfers: Sit to/from Stand, Bed to chair/wheelchair/BSC Sit to Stand: Min assist Stand pivot transfers: Min guard, Min assist         General transfer comment: heavy use of UEs to assist; fair tolerance to RLE WBing; slow and guarded, but no buckling.  Limited by significant SOB with minimal exertion (requiring O2 at 6L with activity)    Ambulation/Gait               General Gait Details: patient declined due to fatigue, SOB  Stairs            Wheelchair Mobility    Modified Rankin (Stroke Patients Only)       Balance Overall balance assessment: Needs assistance Sitting-balance support: No upper extremity supported, Feet supported Sitting balance-Leahy Scale: Good     Standing balance support: Bilateral upper extremity supported Standing balance-Leahy Scale: Fair                               Pertinent Vitals/Pain Pain  Assessment Pain Assessment: Faces Faces Pain Scale: Hurts a little bit Pain Location: R hip Pain Descriptors / Indicators: Grimacing, Guarding, Aching Pain Intervention(s): Limited activity within patient's tolerance, Monitored during session, Repositioned    Home Living Family/patient expects to be discharged to:: Private residence Living Arrangements:  Children Available Help at Discharge: Family;Available PRN/intermittently Type of Home: House Home Access: Stairs to enter Entrance Stairs-Rails: Right;Left (wide) Entrance Stairs-Number of Steps: 4   Home Layout: One level Home Equipment: Rollator (4 wheels);Wheelchair - Forensic psychologist (2 wheels) Additional Comments: has caregiver with her when daughter working    Prior Function Prior Level of Function : Independent/Modified Independent             Mobility Comments: Pt reports she's independent in the home without AD, uses w/c for community mobility 2/2 "my oxygen drops", on 2-3L/min at rest at home but 4L/min with mobility.  Denies additional fall history.       Hand Dominance        Extremity/Trunk Assessment   Upper Extremity Assessment Upper Extremity Assessment: Overall WFL for tasks assessed    Lower Extremity Assessment Lower Extremity Assessment: Overall WFL for tasks assessed (grossly 4+/5 throughout)       Communication   Communication: No difficulties  Cognition Arousal/Alertness: Awake/alert Behavior During Therapy: WFL for tasks assessed/performed Overall Cognitive Status: Within Functional Limits for tasks assessed                                          General Comments      Exercises Other Exercises Other Exercises: Educated in R LE WBing and functional implication; reviewed pursed lip breathing and energy conservation.  Patient voiced understanding of all information.   Assessment/Plan    PT Assessment Patient needs continued PT services  PT Problem List Decreased range of motion;Decreased strength;Decreased activity tolerance;Decreased balance;Decreased mobility;Decreased knowledge of use of DME;Decreased safety awareness;Decreased knowledge of precautions;Cardiopulmonary status limiting activity       PT Treatment Interventions DME instruction;Gait training;Stair training;Functional mobility training;Therapeutic  activities;Therapeutic exercise;Balance training;Patient/family education    PT Goals (Current goals can be found in the Care Plan section)  Acute Rehab PT Goals Patient Stated Goal: to return home PT Goal Formulation: With patient Time For Goal Achievement: 09/29/21 Potential to Achieve Goals: Good    Frequency Min 2X/week     Co-evaluation               AM-PAC PT "6 Clicks" Mobility  Outcome Measure Help needed turning from your back to your side while in a flat bed without using bedrails?: None Help needed moving from lying on your back to sitting on the side of a flat bed without using bedrails?: None Help needed moving to and from a bed to a chair (including a wheelchair)?: A Little Help needed standing up from a chair using your arms (e.g., wheelchair or bedside chair)?: A Little Help needed to walk in hospital room?: A Little Help needed climbing 3-5 steps with a railing? : A Little 6 Click Score: 20    End of Session Equipment Utilized During Treatment: Gait belt;Oxygen Activity Tolerance: Patient tolerated treatment well Patient left: in chair;with call bell/phone within reach;with chair alarm set Nurse Communication: Mobility status PT Visit Diagnosis: Other abnormalities of gait and mobility (R26.89);Difficulty in walking, not elsewhere classified (R26.2)    Time: 4403-4742 PT Time Calculation (min) (ACUTE  ONLY): 28 min   Charges:   PT Evaluation $PT Eval Moderate Complexity: 1 Mod         Victorio Creeden H. Owens Shark, PT, DPT, NCS 09/15/21, 12:09 PM [phone]

## 2021-09-15 NOTE — Care Management CC44 (Signed)
Condition Code 44 Documentation Completed  Patient Details  Name: Sierra Guzman MRN: [id number] Date of Birth: 08/25/1940   Condition Code 44 given:  Yes Patient signature on Condition Code 44 notice:  Yes Documentation of 2 MD's agreement:  Yes Code 44 added to claim:  Yes    Marlowe Sax, RN 09/15/2021, 12:19 PM

## 2021-09-15 NOTE — Discharge Summary (Signed)
Sierra Guzman OZH:[id number] DOB: 03-06-1941 DOA: 09/14/2021  PCP: Ethelda Chick, MD  Admit date: 09/14/2021 Discharge date: 09/15/2021  Admitted From: home Disposition:  home  Recommendations for Outpatient Follow-up:  Follow up with PCP in 1 week Please obtain BMP/CBC in one week     Discharge Condition:Stable CODE STATUS:DNR  Diet recommendation: Heart Healthy  Brief/Interim Summary: Per HPI: Sierra Guzman is a 81 y.o. female with medical history significant for chronic anxiety/depression, hypertension, hyperlipidemia, type 2 diabetes, chronic hypoxia on 2-4 L nasal cannula continuously, who presented to [facility] ED from home after tripping on her oxygen tubing.  She was in her usual state of health prior to this.  EMS was activated.  She was brought into the ED for further evaluation.  Work-up in the ED revealed periprosthetic right hip fracture.  Seen by orthopedic surgery who recommended conservative management and pain control.  No plan for surgery. Patient had PT. She will be discharged home.    Periprosthetic right hip fracture, secondary to mechanical fall Tripped on her oxygen tubing at home Seen by orthopedic surgery, recommends conservative management no surgery is planned. Pain control     Hyperkalemia Serum potassium of 5.3 Treated with 1 dose of Lokelma 10 g x 1 Now stable   Type 2 diabetes with hyperglycemia Last hemoglobin A1c 5.9 on 0 06/11/2021 from 8.1 Continue home meds   Chronic combined diastolic and systolic CHF No acute exacerbation   Multifocal atrial tachycardia Continue home meds   Interstitial lung disease/idiopathic pulmonary fibrosis Chronic hypoxia on 2 to 4 L nasal cannula at baseline    Essential hypertension Continue home meds   Chronic anxiety/depression Continue home meds    Discharge Diagnoses:  Principal Problem:   Periprosthetic hip fracture, initial encounter    Discharge Instructions  Discharge Instructions     Diet -  low sodium heart healthy   Complete by: As directed    Increase activity slowly   Complete by: As directed       Allergies as of 09/15/2021       Reactions   Morphine And Related Nausea And Vomiting        Medication List     TAKE these medications    acetaminophen 325 MG tablet Commonly known as: TYLENOL Take 2 tablets (650 mg total) by mouth every 6 (six) hours as needed (pain).   albuterol (2.5 MG/3ML) 0.083% nebulizer solution Commonly known as: PROVENTIL Take 3 mLs (2.5 mg total) by nebulization every 2 (two) hours as needed for wheezing or shortness of breath.   ALPRAZolam 0.25 MG tablet Commonly known as: XANAX Take 1 tablet (0.25 mg total) by mouth 3 (three) times daily as needed for anxiety.   aspirin EC 81 MG tablet Take 1 tablet (81 mg total) by mouth daily. Swallow whole.   budesonide 0.25 MG/2ML nebulizer solution Commonly known as: PULMICORT Take 2 mLs (0.25 mg total) by nebulization 2 (two) times daily.   cholecalciferol 1000 units tablet Commonly known as: VITAMIN D Take 1,000 Units by mouth daily.   digoxin 0.125 MG tablet Commonly known as: LANOXIN Take 1 tablet (0.125 mg total) by mouth daily.   empagliflozin 10 MG Tabs tablet Commonly known as: JARDIANCE Take 1 tablet (10 mg total) by mouth daily.   Ensure Max Protein Liqd Take 330 mLs (11 oz total) by mouth at bedtime.   feeding supplement (GLUCERNA SHAKE) Liqd Take 237 mLs by mouth 2 (two) times daily between meals.  Fish Oil 1000 MG Caps Take 1,000 mg by mouth daily.   furosemide 20 MG tablet Commonly known as: LASIX Take 1 tablet (20 mg total) by mouth daily.   gabapentin 100 MG capsule Commonly known as: NEURONTIN Take 100 mg by mouth 3 (three) times daily.   HYDROcodone-acetaminophen 5-325 MG tablet Commonly known as: NORCO/VICODIN Take 1-2 tablets by mouth every 6 (six) hours as needed for up to 3 days for moderate pain.   lidocaine 5 % Commonly known as:  LIDODERM Place 1 patch onto the skin daily. Remove & Discard patch within 12 hours or as directed by MD   loratadine 10 MG tablet Commonly known as: CLARITIN Take 10 mg by mouth daily.   magnesium oxide 400 MG tablet Commonly known as: MAG-OX Take 1 tablet by mouth daily.   metFORMIN 850 MG tablet Commonly known as: GLUCOPHAGE Take 850 mg by mouth 2 (two) times daily.   metoprolol succinate 25 MG 24 hr tablet Commonly known as: TOPROL-XL Take 12.5 mg by mouth daily.   mirtazapine 15 MG tablet Commonly known as: REMERON Take 15 mg by mouth at bedtime.   mycophenolate 250 MG capsule Commonly known as: CELLCEPT Take 1 capsule (250 mg total) by mouth 2 (two) times daily.   NAC 600 MG Caps Generic drug: Acetylcysteine Take 600 mg by mouth 2 (two) times daily.   omeprazole 20 MG capsule Commonly known as: PRILOSEC Take 20 mg by mouth daily.   Pirfenidone 267 MG Tabs Take 267 mg by mouth 3 (three) times daily.   polyethylene glycol 17 g packet Commonly known as: MIRALAX / GLYCOLAX Take 17 g by mouth daily.   predniSONE 10 MG tablet Commonly known as: DELTASONE Take 10 mg by mouth daily.   rosuvastatin 40 MG tablet Commonly known as: CRESTOR Take 40 mg by mouth daily.   sertraline 25 MG tablet Commonly known as: ZOLOFT Take 25 mg by mouth daily.   sodium chloride 0.65 % Soln nasal spray Commonly known as: OCEAN Place 2 sprays into both nostrils as needed for congestion.   sulfamethoxazole-trimethoprim 400-80 MG tablet Commonly known as: BACTRIM Take 1 tablet by mouth 3 (three) times a week.        Follow-up Information     Kennedy Bucker, MD Follow up in 3 week(s).   Specialty: Orthopedic Surgery Contact information: [address] [place] [address] [phone]                Allergies  Allergen Reactions   Morphine And Related Nausea And Vomiting     Consultations: orthopedics   Procedures/Studies: CT Lumbar Spine Wo Contrast  Result Date: 09/14/2021 CLINICAL DATA:  Back trauma, . EXAM: CT LUMBAR SPINE WITHOUT CONTRAST TECHNIQUE: Multidetector CT imaging of the lumbar spine was performed without intravenous contrast administration. Multiplanar CT image reconstructions were also generated. RADIATION DOSE REDUCTION: This exam was performed according to the departmental dose-optimization program which includes automated exposure control, adjustment of the mA and/or kV according to patient size and/or use of iterative reconstruction technique. COMPARISON:  No recent imaging.nn FINDINGS: Segmentation: 5 lumbar type vertebrae. Alignment: Normal. Vertebrae: Bipedicular screws and rod fixation at L4-L5. There is a lucent lesion in the posterior aspect of the right ilium measuring approximately 2.4 x 1.6 x 2 point cm. No acute fracture or traumatic subluxation. Mild superior endplate height loss of L2 vertebral body. Paraspinal and other soft tissues: Advanced atherosclerotic calcification of abdominal aorta and branch vessels  which are however normal in caliber. Large exophytic cyst in the lower pole of the right kidney. Paraspinal soft tissues are within normal limits. Disc levels: T12-L1: Significant spinal canal or neural foraminal stenosis. L1-L2: Mild broad-based disc bulge with narrowing of lateral recesses. Mild facet joint arthropathy. No significant neural foraminal stenosis. L2-L3: Broad-based disc bulge with moderate bilateral recess stenosis. Moderate facet joint arthropathy. No significant neural foraminal stenosis. L3-L4: Disc osteophyte complex with marked bilateral lateral recess stenosis. Moderate to severe facet joint arthropathy. No significant neural foraminal stenosis. L4-L5: Disc osteophyte complex with mild central canal stenosis. Heterotopic ossification. No appreciable neural foraminal stenosis. L5-S1: Disc osteophyte complex with  moderate spinal canal stenosis. Moderate facet joint arthropathy. No significant neural foraminal stenosis. IMPRESSION: 1.  No evidence of acute fracture or traumatic subluxation. 2. Postsurgical changes with posterior spinal fusion at L4-L5 with bipedicular screws. The hardware is intact. 3. 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 Disc osteophyte complex with moderate spinal canal stenosis at L5-S1. 4. Lytic lesion in right ilium measuring approximately 2.4 x 1.6 x 2.9 cm. Further evaluation with bone scan would be helpful. Correlate with prior clinical history. Electronically Signed   By: Keane Police D.O.   On: 09/14/2021 18:28   DG Chest Portable 1 View  Result Date: 09/14/2021 CLINICAL DATA:  Hip fracture and being admitted. EXAM: PORTABLE CHEST 1 VIEW COMPARISON:  Chest radiograph 06/10/2021 and chest CTA of 06/10/2021 FINDINGS: Again noted are diffuse parenchymal lung densities that are similar to the previous examination. Findings are most compatible with chronic changes. Heart size is within normal limits and stable. Tracheal deviation towards the right appears unchanged. Negative for pneumothorax. No acute bone abnormality in the thorax. IMPRESSION: Diffuse parenchymal lung densities appear chronic. No acute findings. Electronically Signed   By: Markus Daft M.D.   On: 09/14/2021 18:25   CT Hip Right Wo Contrast  Result Date: 09/14/2021 CLINICAL DATA:  Hip trauma, fracture suspected, xray done EXAM: CT OF THE RIGHT HIP WITHOUT CONTRAST TECHNIQUE: Multidetector CT imaging of the right hip was performed according to the standard protocol. Metal artifact reduction protocol utilized. Multiplanar CT image reconstructions were also generated. RADIATION DOSE REDUCTION: This exam was performed according to the departmental dose-optimization program which includes automated exposure control, adjustment of the mA and/or  kV according to patient size and/or use of iterative reconstruction technique. 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 Acute nondisplaced periprosthetic fracture involving the intertrochanteric aspect of the proximal right femur (series 2, images 57-68). Arthroplasty components are in their expected alignment without dislocation. No evidence to suggest hardware loosening. Included right hemipelvis intact. Ligaments Suboptimally assessed by CT. Muscles and Tendons No acute musculotendinous abnormality by CT. Soft tissues No hematoma or periprosthetic fluid collection. No right inguinal lymphadenopathy. Atherosclerotic vascular calcifications are seen. Colonic diverticulosis. IMPRESSION: Postsurgical changes from right total hip arthroplasty with acute nondisplaced periprosthetic fracture involving the intertrochanteric aspect of the proximal right femur. Electronically Signed   By: Davina Poke D.O.   On: 09/14/2021 14:29   DG Hip Unilat With Pelvis 2-3 Views Right  Result Date: 09/14/2021 CLINICAL DATA:  Fall.  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 Arthroplasty components appear in their expected alignment. Subtle cortical irregularity of the lesser trochanter, which could reflect enthesopathic change, although  a nondisplaced fracture is not excluded. No periprosthetic lucency is seen. Bones are diffusely demineralized. Extensive atherosclerotic vascular calcifications. IMPRESSION: Subtle cortical irregularity of the lesser trochanter, which could reflect enthesopathic change, although a nondisplaced fracture is not excluded. Correlate with point tenderness and consider further evaluation with metal artifact reduction protocol CT of the hip if there is high clinical suspicion for fracture. Electronically Signed   By:  Davina Poke D.O.   On: 09/14/2021 13:33   CT Head Wo Contrast  Result Date: 09/14/2021 CLINICAL DATA:  Head trauma, moderate-severe; Neck trauma (Age >= 65y) EXAM: CT HEAD WITHOUT CONTRAST CT CERVICAL SPINE WITHOUT CONTRAST TECHNIQUE: Multidetector CT imaging of the head and cervical spine was performed following the standard protocol without intravenous contrast. Multiplanar CT image reconstructions of the cervical spine were also generated. RADIATION DOSE REDUCTION: This exam was performed according to the departmental dose-optimization program which includes automated exposure control, adjustment of the mA and/or kV according to patient size and/or use of iterative reconstruction technique. COMPARISON:  None Available. FINDINGS: CT HEAD FINDINGS Brain: No evidence of acute infarction, hemorrhage, hydrocephalus, extra-axial collection or mass lesion/mass effect. Patchy white matter hypodensities, nonspecific but compatible with chronic microvascular ischemic disease. Cerebral atrophy, mild for age. Vascular: Calcific intracranial atherosclerosis. No hyperdense vessel identified. Skull: No acute fracture. Sinuses/Orbits: Clear sinuses.  No acute orbital findings. Other: No mastoid effusions. CT CERVICAL SPINE FINDINGS Alignment: Mild reversal the normal cervical lordosis. No substantial sagittal subluxation. Skull base and vertebrae: Vertebral body heights are maintained. No evidence of acute fracture. Diffuse osteopenia. Soft tissues and spinal canal: No prevertebral fluid or swelling. No visible canal hematoma. Disc levels: Multilevel degenerative disc disease as well as facet arthropathy with fusion across the left C4-C5 facet joint. Upper chest: Partially imaged biapical pleuroparenchymal scarring. IMPRESSION: 1. No evidence of acute intracranial abnormality. 2. No evidence of acute fracture or traumatic malalignment in the cervical spine. Electronically Signed   By: Margaretha Sheffield M.D.   On:  09/14/2021 13:06   CT Cervical Spine Wo Contrast  Result Date: 09/14/2021 CLINICAL DATA:  Head trauma, moderate-severe; Neck trauma (Age >= 65y) EXAM: CT HEAD WITHOUT CONTRAST CT CERVICAL SPINE WITHOUT CONTRAST TECHNIQUE: Multidetector CT imaging of the head and cervical spine was performed following the standard protocol without intravenous contrast. Multiplanar CT image reconstructions of the cervical spine were also generated. RADIATION DOSE REDUCTION: This exam was performed according to the departmental dose-optimization program which includes automated exposure control, adjustment of the mA and/or kV according to patient size and/or use of iterative reconstruction technique. COMPARISON:  None Available. FINDINGS: CT HEAD FINDINGS Brain: No evidence of acute infarction, hemorrhage, hydrocephalus, extra-axial collection or mass lesion/mass effect. Patchy white matter hypodensities, nonspecific but compatible with chronic microvascular ischemic disease. Cerebral atrophy, mild for age. Vascular: Calcific intracranial atherosclerosis. No hyperdense vessel identified. Skull: No acute fracture. Sinuses/Orbits: Clear sinuses.  No acute orbital findings. Other: No mastoid effusions. CT CERVICAL SPINE FINDINGS Alignment: Mild reversal the normal cervical lordosis. No substantial sagittal subluxation. Skull base and vertebrae: Vertebral body heights are maintained. No evidence of acute fracture. Diffuse osteopenia. Soft tissues and spinal canal: No prevertebral fluid or swelling. No visible canal hematoma. Disc levels: Multilevel degenerative disc disease as well as facet arthropathy with fusion across the left C4-C5 facet joint. Upper chest: Partially imaged biapical pleuroparenchymal scarring. IMPRESSION: 1. No evidence of acute intracranial abnormality. 2. No evidence of acute fracture or traumatic malalignment in the cervical spine. Electronically Signed   By:  Margaretha Sheffield M.D.   On: 09/14/2021 13:06       Subjective: No cp, or worsening sob  Discharge Exam: Vitals:   09/15/21 0743 09/15/21 0905  BP:  (!) 131/57  Pulse: 78 89  Resp: 16 16  Temp:  98.1 F (36.7 C)  SpO2: 95% 100%   Vitals:   09/14/21 2244 09/15/21 0529 09/15/21 0743 09/15/21 0905  BP: 110/64 (!) 114/55  (!) 131/57  Pulse: 74 92 78 89  Resp: 20 17 16 16   Temp: 98.9 F (37.2 C) 98.3 F (36.8 C)  98.1 F (36.7 C)  TempSrc: Oral     SpO2: 97% 98% 95% 100%  Weight: 60 kg 63.6 kg    Height: 5\' 5"  (1.651 m)       General: Pt is alert, awake, not in acute distress Cardiovascular: fine dry crackles b/l Respiratory: CTA bilaterally, no wheezing, no rhonchi Abdominal: Soft, NT, ND, bowel sounds + Extremities: no edema, no cyanosis    The results of significant diagnostics from this hospitalization (including imaging, microbiology, ancillary and laboratory) are listed below for reference.     Microbiology: Recent Results (from the past 240 hour(s))  SARS Coronavirus 2 by RT PCR (hospital order, performed in [facility] hospital lab) *cepheid single result test* Anterior Nasal Swab     Status: None   Collection Time: 09/14/21  4:39 PM   Specimen: Anterior Nasal Swab  Result Value Ref Range Status   SARS Coronavirus 2 by RT PCR NEGATIVE NEGATIVE Final    Comment: (NOTE) SARS-CoV-2 target nucleic acids are NOT DETECTED.  The SARS-CoV-2 RNA is generally detectable in upper and lower respiratory specimens during the acute phase of infection. The lowest concentration of SARS-CoV-2 viral copies this assay can detect is 250 copies / mL. A negative result does not preclude SARS-CoV-2 infection and should not be used as the sole basis for treatment or other patient management decisions.  A negative result may occur with improper specimen collection / handling, submission of specimen other than nasopharyngeal swab, presence of viral mutation(s) within the areas targeted by this assay, and inadequate number of  viral copies (<250 copies / mL). A negative result must be combined with clinical observations, patient history, and epidemiological information.  Fact Sheet for Patients:   https://www.patel.info/  Fact Sheet for Healthcare Providers: https://hall.com/  This test is not yet approved or  cleared by the [place] FDA and has been authorized for detection and/or diagnosis of SARS-CoV-2 by FDA under an Emergency Use Authorization (EUA).  This EUA will remain in effect (meaning this test can be used) for the duration of the COVID-19 declaration under Section 564(b)(1) of the Act, 21 U.S.C. section 360bbb-3(b)(1), unless the authorization is terminated or revoked sooner.  Performed at [facility], [address] [place], [place] [address]      Labs: BNP (last 3 results) Recent Labs    05/09/21 1032 06/10/21 2011 06/11/21 0509  BNP 234.9* 563.3* 0000000*   Basic Metabolic Panel: Recent Labs  Lab 09/14/21 1639 09/15/21 0455  NA 138 139  K 5.3* 3.6  CL 99 102  CO2 33* 31  GLUCOSE 194* 148*  BUN 13 15  CREATININE 1.07* 0.84  CALCIUM 9.4 9.0  MG  --  2.3  PHOS  --  4.0   Liver Function Tests: Recent Labs  Lab 09/14/21 1639 09/15/21 0455  AST 35 20  ALT 15 10  ALKPHOS 70 55  BILITOT 1.1 0.3  PROT  6.8 5.7*  ALBUMIN 3.7 3.1*   No results for input(s): "LIPASE", "AMYLASE" in the last 168 hours. No results for input(s): "AMMONIA" in the last 168 hours. CBC: Recent Labs  Lab 09/14/21 1639 09/15/21 0455  WBC 13.5* 10.3  NEUTROABS 11.6* 6.7  HGB 12.5 10.6*  HCT 40.9 34.4*  MCV 98.3 97.2  PLT 228 198   Cardiac Enzymes: No results for input(s): "CKTOTAL", "CKMB", "CKMBINDEX", "TROPONINI" in the last 168 hours. BNP: Invalid input(s): "POCBNP" CBG: Recent Labs  Lab 09/14/21 2238 09/15/21 0815 09/15/21 1146  GLUCAP 202* 154* 151*   D-Dimer No results for input(s): "DDIMER" in the last 72  hours. Hgb A1c No results for input(s): "HGBA1C" in the last 72 hours. Lipid Profile No results for input(s): "CHOL", "HDL", "LDLCALC", "TRIG", "CHOLHDL", "LDLDIRECT" in the last 72 hours. Thyroid function studies No results for input(s): "TSH", "T4TOTAL", "T3FREE", "THYROIDAB" in the last 72 hours.  Invalid input(s): "FREET3" Anemia work up No results for input(s): "VITAMINB12", "FOLATE", "FERRITIN", "TIBC", "IRON", "RETICCTPCT" in the last 72 hours. Urinalysis    Component Value Date/Time   COLORURINE STRAW (A) 09/14/2021 2044   APPEARANCEUR CLEAR (A) 09/14/2021 2044   LABSPEC 1.018 09/14/2021 2044   PHURINE 6.0 09/14/2021 2044   GLUCOSEU >=500 (A) 09/14/2021 2044   HGBUR NEGATIVE 09/14/2021 2044   BILIRUBINUR NEGATIVE 09/14/2021 2044   KETONESUR NEGATIVE 09/14/2021 2044   PROTEINUR NEGATIVE 09/14/2021 2044   NITRITE NEGATIVE 09/14/2021 2044   LEUKOCYTESUR NEGATIVE 09/14/2021 2044   Sepsis Labs Recent Labs  Lab 09/14/21 1639 09/15/21 0455  WBC 13.5* 10.3   Microbiology Recent Results (from the past 240 hour(s))  SARS Coronavirus 2 by RT PCR (hospital order, performed in [place] hospital lab) *cepheid single result test* Anterior Nasal Swab     Status: None   Collection Time: 09/14/21  4:39 PM   Specimen: Anterior Nasal Swab  Result Value Ref Range Status   SARS Coronavirus 2 by RT PCR NEGATIVE NEGATIVE Final    Comment: (NOTE) SARS-CoV-2 target nucleic acids are NOT DETECTED.  The SARS-CoV-2 RNA is generally detectable in upper and lower respiratory specimens during the acute phase of infection. The lowest concentration of SARS-CoV-2 viral copies this assay can detect is 250 copies / mL. A negative result does not preclude SARS-CoV-2 infection and should not be used as the sole basis for treatment or other patient management decisions.  A negative result may occur with improper specimen collection / handling, submission of specimen other than nasopharyngeal  swab, presence of viral mutation(s) within the areas targeted by this assay, and inadequate number of viral copies (<250 copies / mL). A negative result must be combined with clinical observations, patient history, and epidemiological information.  Fact Sheet for Patients:   https://www.patel.info/  Fact Sheet for Healthcare Providers: https://hall.com/  This test is not yet approved or  cleared by the [place] FDA and has been authorized for detection and/or diagnosis of SARS-CoV-2 by FDA under an Emergency Use Authorization (EUA).  This EUA will remain in effect (meaning this test can be used) for the duration of the COVID-19 declaration under Section 564(b)(1) of the Act, 21 U.S.C. section 360bbb-3(b)(1), unless the authorization is terminated or revoked sooner.  Performed at [facility], [address], [place], [place] [address]      Time coordinating discharge: Over 30 minutes  SIGNED:   Nolberto Hanlon, MD  [facility] 09/15/2021, 12:35 PM Pager   If 7PM-7AM, please contact night-coverage [email or web address] Password  [email or web address]

## 2021-09-15 NOTE — Evaluation (Signed)
Physical Therapy Evaluation Patient Details Name: Sierra NajjarRena M Oubre MRN: [id number] DOB: 08/02/1940 Today's Date: 09/15/2021  History of Present Illness  presented to ER secondary to mechanical fall (tripped over O2 cord); admitted for management of R LE peri-prosthetic hip fracture, managed conservatively, WBAT  Clinical Impression  Patient is agreeable to PT session and is hopeful to discharge home later today. Gait training performed for short distance ambulation in room with Min guard initially progressing to supervision with increased gait distance. Ambulation distance limited by endurance and mild increased work of breathing with activity. With short seated rest break, patient recovered quickly and is able to carry on a conversation. The patient was able to demonstrate or verbalize energy conservation techniques with education on taking rest breaks between bouts of activity, pursed lip breathing, and progressing activity slowly. Anticipate patient can return home with family support. Recommend to continue with PT to maximize independence and facilitate return to prior level of function.      Recommendations for follow up therapy are one component of a multi-disciplinary discharge planning process, led by the attending physician.  Recommendations may be updated based on patient status, additional functional criteria and insurance authorization.  Follow Up Recommendations Home health PT    Assistance Recommended at Discharge PRN  Patient can return home with the following  A little help with walking and/or transfers;A little help with bathing/dressing/bathroom    Equipment Recommendations None recommended by PT  Recommendations for Other Services       Functional Status Assessment Patient has had a recent decline in their functional status and demonstrates the ability to make significant improvements in function in a reasonable and predictable amount of time.     Precautions / Restrictions  Precautions Precautions: Fall Restrictions Weight Bearing Restrictions: Yes RLE Weight Bearing: Weight bearing as tolerated      Mobility  Bed Mobility Overal bed mobility: Needs Assistance Bed Mobility: Supine to Sit, Sit to Supine     Supine to sit: Supervision, HOB elevated Sit to supine: Supervision, HOB elevated   General bed mobility comments: increased time required to complete all tasks with no physical assistance needed    Transfers Overall transfer level: Needs assistance Equipment used: Rolling walker (2 wheels) Transfers: Sit to/from Stand Sit to Stand: Supervision           General transfer comment: verbal cues for hand placement for safety. no physical assistance required for standing or sitting. good awareness of need for rest breaks between bouts of activity due to limited enduarnce.    Ambulation/Gait Ambulation/Gait assistance: Supervision, Min guard Gait Distance (Feet): 12 Feet Assistive device: Rolling walker (2 wheels) Gait Pattern/deviations: Step-to pattern, Decreased stride length, Decreased stance time - right       General Gait Details: no loss of balance with ambulation. occasional cues for safety. Min guard progressing to supervision. good overal safety awareness demonstrated. patient self limits progression of further activity due to limited endurance. patient ambulated on 6 L 02 per her request. mild increased shortness of breath is noted with walking, however patient recovers quickly with seated rest break and pursed lip breathing techniques  Stairs Stairs:  (patient declined stair training due to fatigue. she reports the fire department has confirmed they are availbale to assist her in her home. recommend to have assistance with stairs at home give limited baseline endurance.)          Wheelchair Mobility    Modified Rankin (Stroke Patients Only)  Balance Overall balance assessment: Needs assistance Sitting-balance  support: No upper extremity supported, Feet supported Sitting balance-Leahy Scale: Good     Standing balance support: Bilateral upper extremity supported Standing balance-Leahy Scale: Fair Standing balance comment: with rolling walker for UE support                             Pertinent Vitals/Pain Pain Assessment Pain Assessment: Faces Faces Pain Scale: Hurts a little bit Pain Location: R hip Pain Descriptors / Indicators: Grimacing, Guarding, Aching Pain Intervention(s): Limited activity within patient's tolerance, Monitored during session, Patient requesting pain meds-RN notified (encouraged patient to use ice as needed for pain/edema management)    Home Living Family/patient expects to be discharged to:: Private residence Living Arrangements: Children Available Help at Discharge: Family;Available PRN/intermittently Type of Home: House Home Access: Stairs to enter Entrance Stairs-Rails: Right;Left (wide) Entrance Stairs-Number of Steps: 4   Home Layout: One level Home Equipment: Rollator (4 wheels);Wheelchair - Forensic psychologist (2 wheels) Additional Comments: has caregiver with her when daughter working    Prior Function Prior Level of Function : Independent/Modified Independent             Mobility Comments: Pt reports she's independent in the home without AD, uses w/c for community mobility 2/2 "my oxygen drops", on 2-3L/min at rest at home but 4L/min with mobility.  Denies additional fall history.       Hand Dominance        Extremity/Trunk Assessment   Upper Extremity Assessment Upper Extremity Assessment: Overall WFL for tasks assessed    Lower Extremity Assessment Lower Extremity Assessment: Overall WFL for tasks assessed (grossly 4+/5 throughout)       Communication   Communication: No difficulties  Cognition Arousal/Alertness: Awake/alert Behavior During Therapy: WFL for tasks assessed/performed Overall Cognitive Status: Within  Functional Limits for tasks assessed                                 General Comments: patient is able to follow all commands without difficulty        General Comments General comments (skin integrity, edema, etc.): educated patient to continue using energy conservation techniques including taking rest breaks as needed, progressing activity slowly, pursed lip breathing, etc.    Exercises     Assessment/Plan    PT Assessment Patient needs continued PT services  PT Problem List Decreased range of motion;Decreased strength;Decreased activity tolerance;Decreased balance;Decreased mobility;Decreased knowledge of use of DME;Decreased safety awareness;Decreased knowledge of precautions;Cardiopulmonary status limiting activity       PT Treatment Interventions DME instruction;Gait training;Stair training;Functional mobility training;Therapeutic activities;Therapeutic exercise;Balance training;Patient/family education    PT Goals (Current goals can be found in the Care Plan section)  Acute Rehab PT Goals Patient Stated Goal: to return home PT Goal Formulation: With patient Time For Goal Achievement: 09/29/21 Potential to Achieve Goals: Good    Frequency Min 2X/week     Co-evaluation               AM-PAC PT "6 Clicks" Mobility  Outcome Measure Help needed turning from your back to your side while in a flat bed without using bedrails?: None Help needed moving from lying on your back to sitting on the side of a flat bed without using bedrails?: None Help needed moving to and from a bed to a chair (including a wheelchair)?: A Little Help needed standing  up from a chair using your arms (e.g., wheelchair or bedside chair)?: A Little Help needed to walk in hospital room?: A Little Help needed climbing 3-5 steps with a railing? : A Little 6 Click Score: 20    End of Session Equipment Utilized During Treatment: Gait belt;Oxygen Activity Tolerance: Patient tolerated  treatment well Patient left: in bed;with call bell/phone within reach;with bed alarm set Nurse Communication: Mobility status (message sent to care team about mobility status and discharge recommendations) PT Visit Diagnosis: Other abnormalities of gait and mobility (R26.89);Difficulty in walking, not elsewhere classified (R26.2)    Time: 4332-9518 PT Time Calculation (min) (ACUTE ONLY): 39 min   Charges:    PT Treatments $Gait Training: 8-22 mins $Therapeutic Activity: 23-37 mins        Donna Bernard, PT, MPT   Ina Homes 09/15/2021, 2:05 PM

## 2021-09-15 NOTE — Progress Notes (Signed)
Initial Nutrition Assessment  DOCUMENTATION CODES:   Non-severe (moderate) malnutrition in context of chronic illness  INTERVENTION:   -Ensure Enlive po BID, each supplement provides 350 kcal and 20 grams of protein -MVI with minerals daily -Liberalize diet to 2 gram sodium for wider variety of meal selections  NUTRITION DIAGNOSIS:   Moderate Malnutrition related to chronic illness (CHF) as evidenced by mild fat depletion, moderate fat depletion, mild muscle depletion, moderate muscle depletion.  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  Pt admitted with rt hip fracture.   Per orthopedics notes, no plan for surgical intervention.   Reviewed I/O's: +480 ml x 24 hours  Spoke with pt, who was sitting in recliner char at time of visit. Pt shares she worked well with PT just prior to RD visit and anticipates that she may go home today.   Pt with good appetite. Noted meal completions 60-90%. Pt reports she consumes 3 meals per day (beans and vegetables) and also snacks throughout the day. She does not eat a lot of meat per her preference and does not use salt in cooking or at the table. She lives with her daughter, but also has a personal care aide and 2 other daughters that assist with her care. Pt shares that she sometimes will consume Ensure supplements, but is not consistent with it.   Pt denies any weight loss. She reports her UBW is around 130#. Pt progressively lost about 100# several years ago, which she attributed to medication side effects. Reviewed wt hx; pt has  experienced a 6.9% wt loss over the past 6 months, which is not significant for time frame.   Discussed importance of good meal and supplement intake to promote healing. Pt amenable to Ensure supplements.   Medications reviewed and include vitamin D, lasix, remeron, lovaza, and prednisone.   Lab Results  Component Value Date   HGBA1C 5.9 (H) 06/11/2021   PTA DM medications are 10 mg empagliflozin daily.   Labs reviewed: CBGS: 151-202 (inpatient orders for glycemic control are 0-5 units insulin aspart daily at bedtime and 0-9 units insulin aspart TID with meals).    NUTRITION - FOCUSED PHYSICAL EXAM:  Flowsheet Row Most Recent Value  Orbital Region No depletion  Upper Arm Region Mild depletion  Thoracic and Lumbar Region No depletion  Buccal Region Mild depletion  Temple Region Mild depletion  Clavicle Bone Region Moderate depletion  Clavicle and Acromion Bone Region Moderate depletion  Scapular Bone Region Moderate depletion  Dorsal Hand Moderate depletion  Patellar Region Mild depletion  Anterior Thigh Region Mild depletion  Posterior Calf Region Mild depletion  Edema (RD Assessment) None  Hair Reviewed  Eyes Reviewed  Mouth Reviewed  Skin Reviewed  Nails Reviewed       Diet Order:   Diet Order             Diet - low sodium heart healthy           Diet heart healthy/carb modified Room service appropriate? Yes; Fluid consistency: Thin  Diet effective now  EDUCATION NEEDS:   Education needs have been addressed  Skin:  Skin Assessment: Reviewed RN Assessment  Last BM:  09/14/21  Height:   Ht Readings from Last 1 Encounters:  09/14/21 5\' 5"  (1.651 m)    Weight:   Wt Readings from Last 1 Encounters:  09/15/21 63.6 kg    Ideal Body Weight:  56.8 kg  BMI:  Body mass index is 23.35 kg/m.  Estimated Nutritional Needs:   Kcal:  1700-1900  Protein:  85-100 grams  Fluid:  > 1.7 L    Loistine Chance, RD, LDN, [place] Registered  Dietitian II Certified Diabetes Care and Education Specialist Please refer to [facility] for RD and/or RD on-call/weekend/after hours pager

## 2022-01-19 ENCOUNTER — Other Ambulatory Visit: Payer: Self-pay

## 2022-01-19 ENCOUNTER — Emergency Department: DRG: 196 | Attending: Internal Medicine

## 2022-01-19 DIAGNOSIS — Z961 Presence of intraocular lens: Secondary | ICD-10-CM | POA: Diagnosis present

## 2022-01-19 DIAGNOSIS — Z8719 Personal history of other diseases of the digestive system: Secondary | ICD-10-CM

## 2022-01-19 DIAGNOSIS — E440 Moderate protein-calorie malnutrition: Secondary | ICD-10-CM | POA: Diagnosis present

## 2022-01-19 DIAGNOSIS — E860 Dehydration: Secondary | ICD-10-CM | POA: Diagnosis present

## 2022-01-19 DIAGNOSIS — M1990 Unspecified osteoarthritis, unspecified site: Secondary | ICD-10-CM | POA: Diagnosis not present

## 2022-01-19 DIAGNOSIS — I5042 Chronic combined systolic (congestive) and diastolic (congestive) heart failure: Secondary | ICD-10-CM | POA: Diagnosis present

## 2022-01-19 DIAGNOSIS — Z7984 Long term (current) use of oral hypoglycemic drugs: Secondary | ICD-10-CM

## 2022-01-19 DIAGNOSIS — E1122 Type 2 diabetes mellitus with diabetic chronic kidney disease: Secondary | ICD-10-CM | POA: Diagnosis not present

## 2022-01-19 DIAGNOSIS — Z7982 Long term (current) use of aspirin: Secondary | ICD-10-CM

## 2022-01-19 DIAGNOSIS — Z6823 Body mass index (BMI) 23.0-23.9, adult: Secondary | ICD-10-CM

## 2022-01-19 DIAGNOSIS — J84112 Idiopathic pulmonary fibrosis: Secondary | ICD-10-CM | POA: Diagnosis present

## 2022-01-19 DIAGNOSIS — K219 Gastro-esophageal reflux disease without esophagitis: Secondary | ICD-10-CM | POA: Diagnosis present

## 2022-01-19 DIAGNOSIS — Z66 Do not resuscitate: Secondary | ICD-10-CM | POA: Diagnosis present

## 2022-01-19 DIAGNOSIS — Z87891 Personal history of nicotine dependence: Secondary | ICD-10-CM

## 2022-01-19 DIAGNOSIS — Z79899 Other long term (current) drug therapy: Secondary | ICD-10-CM

## 2022-01-19 DIAGNOSIS — Z96652 Presence of left artificial knee joint: Secondary | ICD-10-CM | POA: Diagnosis present

## 2022-01-19 DIAGNOSIS — Z9981 Dependence on supplemental oxygen: Secondary | ICD-10-CM

## 2022-01-19 DIAGNOSIS — N179 Acute kidney failure, unspecified: Secondary | ICD-10-CM | POA: Diagnosis present

## 2022-01-19 DIAGNOSIS — I2510 Atherosclerotic heart disease of native coronary artery without angina pectoris: Secondary | ICD-10-CM | POA: Diagnosis present

## 2022-01-19 DIAGNOSIS — J9621 Acute and chronic respiratory failure with hypoxia: Secondary | ICD-10-CM | POA: Diagnosis present

## 2022-01-19 DIAGNOSIS — Z833 Family history of diabetes mellitus: Secondary | ICD-10-CM

## 2022-01-19 DIAGNOSIS — I358 Other nonrheumatic aortic valve disorders: Secondary | ICD-10-CM | POA: Diagnosis present

## 2022-01-19 DIAGNOSIS — I130 Hypertensive heart and chronic kidney disease with heart failure and stage 1 through stage 4 chronic kidney disease, or unspecified chronic kidney disease: Secondary | ICD-10-CM | POA: Diagnosis present

## 2022-01-19 DIAGNOSIS — N1831 Chronic kidney disease, stage 3a: Secondary | ICD-10-CM | POA: Diagnosis present

## 2022-01-19 DIAGNOSIS — D631 Anemia in chronic kidney disease: Secondary | ICD-10-CM | POA: Diagnosis not present

## 2022-01-19 DIAGNOSIS — R0602 Shortness of breath: Secondary | ICD-10-CM | POA: Diagnosis present

## 2022-01-19 DIAGNOSIS — Z1152 Encounter for screening for COVID-19: Secondary | ICD-10-CM

## 2022-01-19 DIAGNOSIS — Z96643 Presence of artificial hip joint, bilateral: Secondary | ICD-10-CM | POA: Diagnosis not present

## 2022-01-19 DIAGNOSIS — E785 Hyperlipidemia, unspecified: Secondary | ICD-10-CM | POA: Diagnosis present

## 2022-01-19 DIAGNOSIS — Z885 Allergy status to narcotic agent status: Secondary | ICD-10-CM

## 2022-01-19 DIAGNOSIS — Z7951 Long term (current) use of inhaled steroids: Secondary | ICD-10-CM

## 2022-01-19 DIAGNOSIS — Z7952 Long term (current) use of systemic steroids: Secondary | ICD-10-CM

## 2022-01-19 LAB — CBC WITH DIFFERENTIAL/PLATELET
Abs Immature Granulocytes: 0.04 10*3/uL (ref 0.00–0.07)
Basophils Absolute: 0.1 10*3/uL (ref 0.0–0.1)
Basophils Relative: 0 %
Eosinophils Absolute: 0.1 10*3/uL (ref 0.0–0.5)
Eosinophils Relative: 0 %
HCT: 44.4 % (ref 36.0–46.0)
Hemoglobin: 13.3 g/dL (ref 12.0–15.0)
Immature Granulocytes: 0 %
Lymphocytes Relative: 21 %
Lymphs Abs: 2.5 10*3/uL (ref 0.7–4.0)
MCH: 29.8 pg (ref 26.0–34.0)
MCHC: 30 g/dL (ref 30.0–36.0)
MCV: 99.3 fL (ref 80.0–100.0)
Monocytes Absolute: 0.7 10*3/uL (ref 0.1–1.0)
Monocytes Relative: 6 %
Neutro Abs: 8.3 10*3/uL — ABNORMAL HIGH (ref 1.7–7.7)
Neutrophils Relative %: 73 %
Platelets: 273 10*3/uL (ref 150–400)
RBC: 4.47 MIL/uL (ref 3.87–5.11)
RDW: 13.6 % (ref 11.5–15.5)
WBC: 11.7 10*3/uL — ABNORMAL HIGH (ref 4.0–10.5)
nRBC: 0 % (ref 0.0–0.2)

## 2022-01-19 LAB — RESP PANEL BY RT-PCR (FLU A&B, COVID) ARPGX2
Influenza A by PCR: NEGATIVE
Influenza B by PCR: NEGATIVE
SARS Coronavirus 2 by RT PCR: NEGATIVE

## 2022-01-19 LAB — LACTIC ACID, PLASMA
Lactic Acid, Venous: 1.7 mmol/L (ref 0.5–1.9)
Lactic Acid, Venous: 1.8 mmol/L (ref 0.5–1.9)

## 2022-01-19 LAB — TROPONIN I (HIGH SENSITIVITY): Troponin I (High Sensitivity): 47 ng/L — ABNORMAL HIGH (ref <18)

## 2022-01-19 NOTE — ED Triage Notes (Addendum)
PT coming pov today for weakness and trembling. Pt unable to ambulate today. Pt family stating pt has progressively been getting more weak.   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

## 2022-01-19 NOTE — ED Provider Triage Note (Signed)
Emergency Medicine Provider Triage Evaluation Note  Sierra Guzman , a 81 y.o. female  was evaluated in triage.  Pt complains of weakness, shortness of breath, increased oxygen demand. No CP, HA, emesis, diarrhea, urinary changes. Chronic O2 use but increased to 4lpm. No abdominal pain. Coughing.  Review of Systems  Positive: Weakness, increased shortness of breath, increased oxygen demand Negative: CP, urinary changes, abd pain, diarrhea  Physical Exam  BP 129/68   Pulse 100   Temp 98.4 F (36.9 C)   Resp 18   Wt 64 kg   SpO2 96%   BMI 23.48 kg/m  Gen:   Awake, no distress   Resp:  Normal effort but increased oxygen use MSK:   Moves extremities without difficulty  Other:    Medical Decision Making  Medically screening exam initiated at 8:36 PM.  Appropriate orders placed.  Sierra Guzman was informed that the remainder of the evaluation will be completed by another provider, this initial triage assessment does not replace that evaluation, and the importance of remaining in the ED until their evaluation is complete.  Patient will have ekg, labs, chest xray, swab at this time   Sierra Guzman 01/19/22 2036

## 2022-01-20 ENCOUNTER — Encounter: Payer: Self-pay | Admitting: Family Medicine

## 2022-01-20 ENCOUNTER — Inpatient Hospital Stay
Admission: EM | Admit: 2022-01-20 | Discharge: 2022-01-21 | Disposition: A | Discharge status: Hospice | Attending: Internal Medicine | Admitting: Internal Medicine

## 2022-01-20 ENCOUNTER — Emergency Department

## 2022-01-20 DIAGNOSIS — R0602 Shortness of breath: Secondary | ICD-10-CM | POA: Diagnosis present

## 2022-01-20 DIAGNOSIS — E785 Hyperlipidemia, unspecified: Secondary | ICD-10-CM | POA: Diagnosis present

## 2022-01-20 DIAGNOSIS — J84112 Idiopathic pulmonary fibrosis: Secondary | ICD-10-CM | POA: Diagnosis present

## 2022-01-20 DIAGNOSIS — I5032 Chronic diastolic (congestive) heart failure: Secondary | ICD-10-CM | POA: Diagnosis not present

## 2022-01-20 DIAGNOSIS — Z1152 Encounter for screening for COVID-19: Secondary | ICD-10-CM | POA: Diagnosis not present

## 2022-01-20 DIAGNOSIS — I5020 Unspecified systolic (congestive) heart failure: Secondary | ICD-10-CM | POA: Diagnosis present

## 2022-01-20 DIAGNOSIS — E440 Moderate protein-calorie malnutrition: Secondary | ICD-10-CM | POA: Diagnosis present

## 2022-01-20 DIAGNOSIS — I130 Hypertensive heart and chronic kidney disease with heart failure and stage 1 through stage 4 chronic kidney disease, or unspecified chronic kidney disease: Secondary | ICD-10-CM | POA: Diagnosis present

## 2022-01-20 DIAGNOSIS — K219 Gastro-esophageal reflux disease without esophagitis: Secondary | ICD-10-CM | POA: Diagnosis present

## 2022-01-20 DIAGNOSIS — Z87891 Personal history of nicotine dependence: Secondary | ICD-10-CM | POA: Diagnosis not present

## 2022-01-20 DIAGNOSIS — Z66 Do not resuscitate: Secondary | ICD-10-CM | POA: Diagnosis present

## 2022-01-20 DIAGNOSIS — J9621 Acute and chronic respiratory failure with hypoxia: Secondary | ICD-10-CM | POA: Diagnosis present

## 2022-01-20 DIAGNOSIS — I358 Other nonrheumatic aortic valve disorders: Secondary | ICD-10-CM | POA: Diagnosis present

## 2022-01-20 DIAGNOSIS — I5042 Chronic combined systolic (congestive) and diastolic (congestive) heart failure: Secondary | ICD-10-CM | POA: Diagnosis present

## 2022-01-20 DIAGNOSIS — I2510 Atherosclerotic heart disease of native coronary artery without angina pectoris: Secondary | ICD-10-CM | POA: Diagnosis present

## 2022-01-20 DIAGNOSIS — D631 Anemia in chronic kidney disease: Secondary | ICD-10-CM | POA: Diagnosis present

## 2022-01-20 DIAGNOSIS — M1990 Unspecified osteoarthritis, unspecified site: Secondary | ICD-10-CM | POA: Diagnosis present

## 2022-01-20 DIAGNOSIS — E1122 Type 2 diabetes mellitus with diabetic chronic kidney disease: Secondary | ICD-10-CM | POA: Diagnosis present

## 2022-01-20 DIAGNOSIS — N1831 Chronic kidney disease, stage 3a: Secondary | ICD-10-CM | POA: Diagnosis not present

## 2022-01-20 DIAGNOSIS — N179 Acute kidney failure, unspecified: Secondary | ICD-10-CM | POA: Diagnosis present

## 2022-01-20 DIAGNOSIS — Z6823 Body mass index (BMI) 23.0-23.9, adult: Secondary | ICD-10-CM | POA: Diagnosis not present

## 2022-01-20 DIAGNOSIS — Z96652 Presence of left artificial knee joint: Secondary | ICD-10-CM | POA: Diagnosis present

## 2022-01-20 DIAGNOSIS — J9611 Chronic respiratory failure with hypoxia: Secondary | ICD-10-CM | POA: Diagnosis present

## 2022-01-20 DIAGNOSIS — E860 Dehydration: Secondary | ICD-10-CM | POA: Diagnosis present

## 2022-01-20 DIAGNOSIS — Z96643 Presence of artificial hip joint, bilateral: Secondary | ICD-10-CM | POA: Diagnosis present

## 2022-01-20 DIAGNOSIS — Z8719 Personal history of other diseases of the digestive system: Secondary | ICD-10-CM | POA: Diagnosis not present

## 2022-01-20 DIAGNOSIS — J849 Interstitial pulmonary disease, unspecified: Secondary | ICD-10-CM | POA: Diagnosis present

## 2022-01-20 DIAGNOSIS — Z833 Family history of diabetes mellitus: Secondary | ICD-10-CM | POA: Diagnosis not present

## 2022-01-20 DIAGNOSIS — D638 Anemia in other chronic diseases classified elsewhere: Secondary | ICD-10-CM | POA: Diagnosis present

## 2022-01-20 DIAGNOSIS — R531 Weakness: Secondary | ICD-10-CM

## 2022-01-20 LAB — COMPREHENSIVE METABOLIC PANEL
ALT: 9 U/L (ref 0–44)
AST: 22 U/L (ref 15–41)
Albumin: 3.4 g/dL — ABNORMAL LOW (ref 3.5–5.0)
Alkaline Phosphatase: 58 U/L (ref 38–126)
Anion gap: 10 (ref 5–15)
BUN: 24 mg/dL — ABNORMAL HIGH (ref 8–23)
CO2: 29 mmol/L (ref 22–32)
Calcium: 9.5 mg/dL (ref 8.9–10.3)
Chloride: 102 mmol/L (ref 98–111)
Creatinine, Ser: 1.33 mg/dL — ABNORMAL HIGH (ref 0.44–1.00)
GFR, Estimated: 40 mL/min — ABNORMAL LOW (ref >60)
Glucose, Bld: 146 mg/dL — ABNORMAL HIGH (ref 70–99)
Potassium: 3.9 mmol/L (ref 3.5–5.1)
Sodium: 141 mmol/L (ref 135–145)
Total Bilirubin: 0.7 mg/dL (ref 0.3–1.2)
Total Protein: 6.4 g/dL — ABNORMAL LOW (ref 6.5–8.1)

## 2022-01-20 LAB — CREATININE, SERUM
Creatinine, Ser: 1.15 mg/dL — ABNORMAL HIGH (ref 0.44–1.00)
GFR, Estimated: 48 mL/min — ABNORMAL LOW (ref >60)

## 2022-01-20 LAB — URINALYSIS, ROUTINE W REFLEX MICROSCOPIC
Bacteria, UA: NONE SEEN
Bilirubin Urine: NEGATIVE
Glucose, UA: >=500 mg/dL — AB
Hgb urine dipstick: NEGATIVE
Ketones, ur: NEGATIVE mg/dL
Nitrite: NEGATIVE
Protein, ur: NEGATIVE mg/dL
Specific Gravity, Urine: 1.029 (ref 1.005–1.030)
pH: 5 (ref 5.0–8.0)

## 2022-01-20 LAB — CBC
HCT: 38.3 % (ref 36.0–46.0)
Hemoglobin: 11.7 g/dL — ABNORMAL LOW (ref 12.0–15.0)
MCH: 29.5 pg (ref 26.0–34.0)
MCHC: 30.5 g/dL (ref 30.0–36.0)
MCV: 96.5 fL (ref 80.0–100.0)
Platelets: 250 10*3/uL (ref 150–400)
RBC: 3.97 MIL/uL (ref 3.87–5.11)
RDW: 12.9 % (ref 11.5–15.5)
WBC: 9.8 10*3/uL (ref 4.0–10.5)
nRBC: 0 % (ref 0.0–0.2)

## 2022-01-20 LAB — TROPONIN I (HIGH SENSITIVITY): Troponin I (High Sensitivity): 48 ng/L — ABNORMAL HIGH (ref <18)

## 2022-01-20 LAB — CBG MONITORING, ED
Glucose-Capillary: 210 mg/dL — ABNORMAL HIGH (ref 70–99)
Glucose-Capillary: 239 mg/dL — ABNORMAL HIGH (ref 70–99)
Glucose-Capillary: 130 mg/dL — ABNORMAL HIGH (ref 70–99)

## 2022-01-20 LAB — HEMOGLOBIN A1C
Hgb A1c MFr Bld: 7 % — ABNORMAL HIGH (ref 4.8–5.6)
Mean Plasma Glucose: 154.2 mg/dL

## 2022-01-20 LAB — GLUCOSE, CAPILLARY: Glucose-Capillary: 209 mg/dL — ABNORMAL HIGH (ref 70–99)

## 2022-01-20 MED ORDER — ENOXAPARIN SODIUM 30 MG/0.3ML IJ SOSY
30.0000 mg | PREFILLED_SYRINGE | INTRAMUSCULAR | Status: DC
  Administered 2022-01-20: 30 mg via SUBCUTANEOUS
  Filled 2022-01-20: qty 0.3

## 2022-01-20 MED ORDER — BUDESONIDE 0.25 MG/2ML IN SUSP
0.2500 mg | Freq: Two times a day (BID) | RESPIRATORY_TRACT | Status: DC
  Administered 2022-01-20 – 2022-01-21 (×3): 0.25 mg via RESPIRATORY_TRACT
  Filled 2022-01-20 (×3): qty 2

## 2022-01-20 MED ORDER — ONDANSETRON HCL 4 MG PO TABS: 4.0000 mg | ORAL_TABLET | Freq: Four times a day (QID) | ORAL | Status: DC | PRN

## 2022-01-20 MED ORDER — ACETAMINOPHEN 325 MG PO TABS: 650.0000 mg | ORAL_TABLET | Freq: Four times a day (QID) | ORAL | Status: DC | PRN

## 2022-01-20 MED ORDER — METHYLPREDNISOLONE SODIUM SUCC 125 MG IJ SOLR
80.0000 mg | Freq: Four times a day (QID) | INTRAMUSCULAR | Status: DC
  Administered 2022-01-20 – 2022-01-21 (×5): 80 mg via INTRAVENOUS
  Filled 2022-01-20 (×5): qty 2

## 2022-01-20 MED ORDER — INSULIN ASPART 100 UNIT/ML IJ SOLN
0.0000 [IU] | Freq: Every day | INTRAMUSCULAR | Status: DC
  Administered 2022-01-20: 2 [IU] via SUBCUTANEOUS
  Filled 2022-01-20: qty 1

## 2022-01-20 MED ORDER — GLUCERNA SHAKE PO LIQD
237.0000 mL | Freq: Two times a day (BID) | ORAL | Status: DC
  Administered 2022-01-21 (×2): 237 mL via ORAL

## 2022-01-20 MED ORDER — POLYETHYLENE GLYCOL 3350 17 G PO PACK
17.0000 g | PACK | Freq: Every day | ORAL | Status: DC
  Filled 2022-01-20: qty 1

## 2022-01-20 MED ORDER — MAGNESIUM OXIDE 400 MG PO TABS
400.0000 mg | ORAL_TABLET | Freq: Every day | ORAL | Status: DC
  Administered 2022-01-20 – 2022-01-21 (×2): 400 mg via ORAL
  Filled 2022-01-20 (×4): qty 1

## 2022-01-20 MED ORDER — INSULIN ASPART 100 UNIT/ML IJ SOLN
0.0000 [IU] | Freq: Three times a day (TID) | INTRAMUSCULAR | Status: DC
  Administered 2022-01-20 (×2): 7 [IU] via SUBCUTANEOUS
  Administered 2022-01-21: 4 [IU] via SUBCUTANEOUS
  Administered 2022-01-21: 7 [IU] via SUBCUTANEOUS
  Filled 2022-01-20 (×4): qty 1

## 2022-01-20 MED ORDER — ONDANSETRON HCL 4 MG/2ML IJ SOLN: 4.0000 mg | Freq: Four times a day (QID) | INTRAMUSCULAR | Status: DC | PRN

## 2022-01-20 MED ORDER — DIGOXIN 125 MCG PO TABS
0.1250 mg | ORAL_TABLET | Freq: Every day | ORAL | Status: DC
  Administered 2022-01-20 – 2022-01-21 (×2): 0.125 mg via ORAL
  Filled 2022-01-20 (×2): qty 1

## 2022-01-20 MED ORDER — MIRTAZAPINE 15 MG PO TABS
15.0000 mg | ORAL_TABLET | Freq: Every day | ORAL | Status: DC
  Administered 2022-01-20: 15 mg via ORAL
  Filled 2022-01-20: qty 1

## 2022-01-20 MED ORDER — IPRATROPIUM-ALBUTEROL 0.5-2.5 (3) MG/3ML IN SOLN
3.0000 mL | Freq: Once | RESPIRATORY_TRACT | Status: AC
  Administered 2022-01-20: 3 mL via RESPIRATORY_TRACT
  Filled 2022-01-20: qty 3

## 2022-01-20 MED ORDER — ACETAMINOPHEN 650 MG RE SUPP: 650.0000 mg | Freq: Four times a day (QID) | RECTAL | Status: DC | PRN

## 2022-01-20 MED ORDER — METOPROLOL SUCCINATE ER 25 MG PO TB24
12.5000 mg | ORAL_TABLET | Freq: Every day | ORAL | Status: DC
  Administered 2022-01-20 – 2022-01-21 (×2): 12.5 mg via ORAL
  Filled 2022-01-20: qty 0.5
  Filled 2022-01-20: qty 1

## 2022-01-20 MED ORDER — SODIUM CHLORIDE 0.9 % IV SOLN
500.0000 mg | INTRAVENOUS | Status: DC
  Administered 2022-01-20 – 2022-01-21 (×2): 500 mg via INTRAVENOUS
  Filled 2022-01-20: qty 500
  Filled 2022-01-20: qty 5

## 2022-01-20 MED ORDER — PIRFENIDONE 267 MG PO TABS: 534.0000 mg | ORAL_TABLET | Freq: Three times a day (TID) | ORAL | Status: DC

## 2022-01-20 MED ORDER — SERTRALINE HCL 50 MG PO TABS
25.0000 mg | ORAL_TABLET | Freq: Every day | ORAL | Status: DC
  Administered 2022-01-20 – 2022-01-21 (×2): 25 mg via ORAL
  Filled 2022-01-20 (×2): qty 1

## 2022-01-20 MED ORDER — LORATADINE 10 MG PO TABS
10.0000 mg | ORAL_TABLET | Freq: Every day | ORAL | Status: DC
  Administered 2022-01-21: 10 mg via ORAL
  Filled 2022-01-20: qty 1

## 2022-01-20 MED ORDER — ACETYLCYSTEINE 600 MG PO CAPS: 600.0000 mg | ORAL_CAPSULE | Freq: Two times a day (BID) | ORAL | Status: DC

## 2022-01-20 MED ORDER — VITAMIN D 25 MCG (1000 UNIT) PO TABS
1000.0000 [IU] | ORAL_TABLET | Freq: Every day | ORAL | Status: DC
  Administered 2022-01-20 – 2022-01-21 (×2): 1000 [IU] via ORAL
  Filled 2022-01-20 (×2): qty 1

## 2022-01-20 MED ORDER — ALPRAZOLAM 0.25 MG PO TABS
0.2500 mg | ORAL_TABLET | Freq: Three times a day (TID) | ORAL | Status: DC | PRN
  Administered 2022-01-21: 0.25 mg via ORAL
  Filled 2022-01-20: qty 1

## 2022-01-20 MED ORDER — SALINE SPRAY 0.65 % NA SOLN: 2.0000 | NASAL | Status: DC | PRN

## 2022-01-20 MED ORDER — ALBUTEROL SULFATE (2.5 MG/3ML) 0.083% IN NEBU: 2.5000 mg | INHALATION_SOLUTION | RESPIRATORY_TRACT | Status: DC | PRN

## 2022-01-20 MED ORDER — PANTOPRAZOLE SODIUM 40 MG PO TBEC
40.0000 mg | DELAYED_RELEASE_TABLET | Freq: Every day | ORAL | Status: DC
  Administered 2022-01-20 – 2022-01-21 (×2): 40 mg via ORAL
  Filled 2022-01-20 (×2): qty 1

## 2022-01-20 MED ORDER — METHYLPREDNISOLONE SODIUM SUCC 125 MG IJ SOLR
125.0000 mg | Freq: Once | INTRAMUSCULAR | Status: AC
  Administered 2022-01-20: 125 mg via INTRAVENOUS
  Filled 2022-01-20: qty 2

## 2022-01-20 MED ORDER — OMEGA-3-ACID ETHYL ESTERS 1 G PO CAPS
1.0000 g | ORAL_CAPSULE | Freq: Two times a day (BID) | ORAL | Status: DC
  Administered 2022-01-20 – 2022-01-21 (×2): 1 g via ORAL
  Filled 2022-01-20 (×2): qty 1

## 2022-01-20 MED ORDER — MYCOPHENOLATE MOFETIL 250 MG PO CAPS
250.0000 mg | ORAL_CAPSULE | Freq: Two times a day (BID) | ORAL | Status: DC
  Administered 2022-01-20 – 2022-01-21 (×3): 250 mg via ORAL
  Filled 2022-01-20 (×3): qty 1

## 2022-01-20 MED ORDER — ASPIRIN 81 MG PO TBEC
81.0000 mg | DELAYED_RELEASE_TABLET | Freq: Every day | ORAL | Status: DC
  Administered 2022-01-20 – 2022-01-21 (×2): 81 mg via ORAL
  Filled 2022-01-20 (×2): qty 1

## 2022-01-20 MED ORDER — IOHEXOL 350 MG/ML SOLN
75.0000 mL | Freq: Once | INTRAVENOUS | Status: AC | PRN
  Administered 2022-01-20: 75 mL via INTRAVENOUS

## 2022-01-20 MED ORDER — EMPAGLIFLOZIN 25 MG PO TABS
25.0000 mg | ORAL_TABLET | Freq: Every day | ORAL | Status: DC
  Administered 2022-01-21: 25 mg via ORAL
  Filled 2022-01-20: qty 1

## 2022-01-20 MED ORDER — GABAPENTIN 100 MG PO CAPS
100.0000 mg | ORAL_CAPSULE | Freq: Three times a day (TID) | ORAL | Status: DC
  Administered 2022-01-20 – 2022-01-21 (×4): 100 mg via ORAL
  Filled 2022-01-20 (×4): qty 1

## 2022-01-20 MED ORDER — ENSURE MAX PROTEIN PO LIQD
11.0000 [oz_av] | Freq: Every day | ORAL | Status: DC
  Administered 2022-01-20: 11 [oz_av] via ORAL
  Filled 2022-01-20: qty 330

## 2022-01-20 NOTE — ED Notes (Signed)
Pt's bed adjusted and given meal tray

## 2022-01-20 NOTE — ED Notes (Signed)
Dietary at bedside and refused breakfast tray

## 2022-01-20 NOTE — Progress Notes (Signed)
Anticoagulation monitoring(Lovenox):  81yo  F ordered Lovenox 40 mg Q24h    Filed Weights   01/19/22 2031 01/20/22 0208  Weight: 64 kg (141 lb 1.5 oz) 64 kg (141 lb 1.5 oz)   BMI 23   Lab Results  Component Value Date   CREATININE 1.33 (H) 01/20/2022   CREATININE 0.84 09/15/2021   CREATININE 1.07 (H) 09/14/2021   Estimated Creatinine Clearance: 29.3 mL/min (A) (by C-G formula based on SCr of 1.33 mg/dL (H)). Hemoglobin & Hematocrit     Component Value Date/Time   HGB 13.3 01/19/2022 2034   HCT 44.4 01/19/2022 2034     Per Protocol for Patient with estCrcl < 30 ml/min and BMI < 30, will transition to Lovenox 30 mg Q24h      Chinita Greenland PharmD Clinical Pharmacist 01/20/2022

## 2022-01-20 NOTE — ED Provider Notes (Signed)
[facility] Provider Note    Event Date/Time   First MD Initiated Contact with Patient 01/20/22 0037     (approximate)   History   Weakness   HPI  Sierra Guzman is a 81 y.o. female who presents to the ED for evaluation of Weakness   I reviewed pulmonary clinic visit from 8/28.  History of pulmonary fibrosis with chronic respiratory failure.  Remains on 3 L.  Bactrim single strength for PCP prophylaxis, prednisone, pirfenidone and CellCept. DM on multiple oral agents, digoxin  Daughter brings patient to the ED for evaluation of increasing generalized weakness, shortness of breath and productive cough.  Worsening sputum production over the past couple days alongside weakness and dyspnea.  Typically on 2 L at rest and 4 L with ambulation, but has been requesting increasing her sedentary O2 to 4 L.  Physical Exam   Triage Vital Signs: ED Triage Vitals  Enc Vitals Group     BP 01/19/22 2030 129/68     Pulse Rate 01/19/22 2030 100     Resp 01/19/22 2030 18     Temp 01/19/22 2030 98.4 F (36.9 C)     Temp Source 01/19/22 2202 Oral     SpO2 01/19/22 2030 96 %     Weight 01/19/22 2031 141 lb 1.5 oz (64 kg)     Height --      Head Circumference --      Peak Flow --      Pain Score 01/19/22 2031 0     Pain Loc --      Pain Edu? --      Excl. in [place]? --     Most recent vital signs: Vitals:   01/20/22 0512 01/20/22 0730  BP: 128/66 123/62  Pulse: 82 83  Resp: 20 18  Temp:    SpO2: 99% 100%    General: Awake, no distress.  CV:  Good peripheral perfusion.  Resp:  Slightly tachypneic to the mid/upper 20s.  No wheezing appreciated. Abd:  No distention.  MSK:  No deformity noted.  Neuro:  No focal deficits appreciated. Other:     ED Results / Procedures / Treatments   Labs (all labs ordered are listed, but only abnormal results are displayed) Labs Reviewed  CBC WITH DIFFERENTIAL/PLATELET - Abnormal; Notable for the following components:       Result Value   WBC 11.7 (*)    Neutro Abs 8.3 (*)    All other components within normal limits  COMPREHENSIVE METABOLIC PANEL - Abnormal; Notable for the following components:   Glucose, Bld 146 (*)    BUN 24 (*)    Creatinine, Ser 1.33 (*)    Total Protein 6.4 (*)    Albumin 3.4 (*)    GFR, Estimated 40 (*)    All other components within normal limits  TROPONIN I (HIGH SENSITIVITY) - Abnormal; Notable for the following components:   Troponin I (High Sensitivity) 47 (*)    All other components within normal limits  TROPONIN I (HIGH SENSITIVITY) - Abnormal; Notable for the following components:   Troponin I (High Sensitivity) 48 (*)    All other components within normal limits  RESP PANEL BY RT-PCR (FLU A&B, COVID) [id number]  LACTIC ACID, PLASMA  LACTIC ACID, PLASMA  URINALYSIS, ROUTINE W REFLEX MICROSCOPIC  HEMOGLOBIN A1C    EKG Sinus tachycardia with sinus arrhythmia, rate of 116 bpm.  Leftward axis.  Normal intervals.  Nonspecific ST changes inferiorly without  clear STEMI.  RADIOLOGY 2 view CXR interpreted by me with chronic interstitial changes without clear superimposed infiltrate.  Official radiology report(s): CT Angio Chest PE W and/or Wo Contrast  Result Date: 01/20/2022 CLINICAL DATA:  81 year old female with weakness, chest pain, productive cough. Pulmonary fibrosis. EXAM: CT ANGIOGRAPHY CHEST WITH CONTRAST TECHNIQUE: Multidetector CT imaging of the chest was performed using the standard protocol during bolus administration of intravenous contrast. Multiplanar CT image reconstructions and MIPs were obtained to evaluate the vascular anatomy. RADIATION DOSE REDUCTION: This exam was performed according to the departmental dose-optimization program which includes automated exposure control, adjustment of the mA and/or kV according to patient size and/or use of iterative reconstruction technique. CONTRAST:  28mL OMNIPAQUE IOHEXOL 350 MG/ML SOLN COMPARISON:  Chest CTA 06/10/2021.   Chest radiographs 01/19/2022. FINDINGS: Cardiovascular: Excellent contrast bolus timing in the pulmonary arterial tree. No focal filling defect identified in the pulmonary arteries to suggest acute pulmonary some chronic enlargement of the central pulmonary arteries compatible with sequelae of chronic lung disease with a degree of pulmonary hypertension (series 6, image 148). Calcified aortic atherosclerosis. Calcified coronary artery atherosclerosis (series 6, image 170). Mild cardiomegaly. No pericardial effusion. Mediastinum/Nodes: Stable reactive appearing mediastinal, mostly anterior carina lymph nodes since March. Lungs/Pleura: Lung volumes have not significantly changed since March. Diffuse bilateral pulmonary interstitial lung disease with central ground-glass opacity and peripheral, basilar reticular opacity appears stable. Some associated bronchiectasis. No convincing pleural effusion or pneumonia. Upper Abdomen: Small to moderate gastric hiatal hernia is stable. Left upper pole exophytic renal cyst with mildly complex fluid density was evaluated on renal ultrasound 01/12/2021 and had a benign appearance at that time with no interval enlargement (no follow-up imaging recommended). Larger right midpole renal cyst partially identified with simple fluid density, and also had a benign appearance 1 year ago. Negative visible liver, spleen, pancreas, adrenal glands, and intestines in the upper abdomen. Musculoskeletal: Stable visualized osseous structures. Chronic loss of the T7-T8 and T8-T9 disc spaces. Review of the MIP images confirms the above findings. IMPRESSION: 1. No evidence of acute pulmonary embolus. 2. Chronic pulmonary Fibrosis and Bronchiectasis. No superimposed acute pulmonary abnormality. 3. Stable cardiomegaly. Calcified coronary artery and Aortic Atherosclerosis (ICD10-I70.0). Hiatal hernia. Chronic and benign appearing renal cysts. Electronically Signed   By: Genevie Ann M.D.   On: 01/20/2022  04:20   DG Chest 2 View  Result Date: 01/19/2022 CLINICAL DATA:  Weakness EXAM: CHEST - 2 VIEW COMPARISON:  09/14/2021, CT 06/10/2021, 03/10/2021 FINDINGS: Diffuse bilateral reticular interstitial opacity and mild ground-glass corresponding to chronic interstitial lung disease. No acute superimposed airspace disease, pleural effusion or pneumothorax. Stable cardiomediastinal silhouette. IMPRESSION: 1. Chronic interstitial lung disease without definite acute superimposed airspace disease Electronically Signed   By: Donavan Foil M.D.   On: 01/19/2022 21:05    PROCEDURES and INTERVENTIONS:  .Critical Care  Performed by: Vladimir Crofts, MD Authorized by: Vladimir Crofts, MD   Critical care provider statement:    Critical care time (minutes):  30   Critical care time was exclusive of:  Separately billable procedures and treating other patients   Critical care was necessary to treat or prevent imminent or life-threatening deterioration of the following conditions:  Respiratory failure   Critical care was time spent personally by me on the following activities:  Development of treatment plan with patient or surrogate, discussions with consultants, evaluation of patient's response to treatment, examination of patient, ordering and review of laboratory studies, ordering and review of radiographic studies, ordering and performing  treatments and interventions, pulse oximetry, re-evaluation of patient's condition and review of old charts .1-3 Lead EKG Interpretation  Performed by: Vladimir Crofts, MD Authorized by: Vladimir Crofts, MD     Interpretation: normal     ECG rate:  60   ECG rate assessment: normal     Rhythm: sinus rhythm     Ectopy: none     Medications  albuterol (PROVENTIL) (2.5 MG/3ML) 0.083% nebulizer solution 2.5 mg (has no administration in time range)  methylPREDNISolone sodium succinate (SOLU-MEDROL) 125 mg/2 mL injection 80 mg (has no administration in time range)  azithromycin  (ZITHROMAX) 500 mg in sodium chloride 0.9 % 250 mL IVPB (has no administration in time range)  insulin aspart (novoLOG) injection 0-20 Units (has no administration in time range)  insulin aspart (novoLOG) injection 0-5 Units (has no administration in time range)  methylPREDNISolone sodium succinate (SOLU-MEDROL) 125 mg/2 mL injection 125 mg (125 mg Intravenous Given 01/20/22 0212)  ipratropium-albuterol (DUONEB) 0.5-2.5 (3) MG/3ML nebulizer solution 3 mL (3 mLs Nebulization Given 01/20/22 0214)  iohexol (OMNIPAQUE) 350 MG/ML injection 75 mL (75 mLs Intravenous Contrast Given 01/20/22 0356)     IMPRESSION / MDM / ASSESSMENT AND PLAN / ED COURSE  I reviewed the triage vital signs and the nursing notes.  Differential diagnosis includes, but is not limited to, ILD exacerbation, pneumothorax, pneumonia, sepsis, ACS  {Patient presents with symptoms of an acute illness or injury that is potentially life-threatening.  81 year old woman with chronic ILD presents with evidence of an exacerbation and possibly pneumonia considering her increased sputum production.  Noted to have a leukocytosis, but no significant vital sign derangements otherwise to suggest Sirs or sepsis.  Is requiring 4-6 L supplemental oxygen.  Troponin is slightly elevated and we will continue to trend this.  Normal lactic acid and flu/COVID test.  We will CT scan her chest to further assess for infiltrate and PE.  I anticipate she will require medical admission.  No PE or infiltrate noted on CTA chest.  Some improving symptoms with breathing treatments and steroids.  Consulted with medicine for admission.     FINAL CLINICAL IMPRESSION(S) / ED DIAGNOSES   Final diagnoses:  ILD (interstitial lung disease) ([place])  Generalized weakness  Shortness of breath     Rx / DC Orders   ED Discharge Orders     None        Note:  This document was prepared using Dragon voice recognition software and may include unintentional  dictation errors.   Vladimir Crofts, MD 01/20/22 [phone]

## 2022-01-20 NOTE — ED Notes (Signed)
Pt brought to ED rm 19 at this time, this RN now assuming care. 

## 2022-01-20 NOTE — ED Notes (Signed)
Pt comfortable at this time. Ice water given, ok per MD.

## 2022-01-20 NOTE — ED Notes (Addendum)
Barrier cream and sacral foam applied to pt's buttocks. Patient sitting up and call bell within reach.

## 2022-01-20 NOTE — ED Notes (Signed)
Informed RN bed assigned 

## 2022-01-20 NOTE — ED Notes (Signed)
Pt repositioned in bed.

## 2022-01-20 NOTE — H&P (Signed)
History and Physical    Patient: Sierra Guzman [id number] DOB: [birth year] DOA: 01/20/2022 DOS: the patient was seen and examined on 01/20/2022 PCP: Wardell Honour, MD  Patient coming from: Home-has 3 daughters who assist her regularly with care.  Chief Complaint:  Chief Complaint  Patient presents with   Weakness   HPI: Sierra Guzman is a 81 y.o. 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  Chronically on 2 L oxygen per minute and is on suppressive therapy with Bactrim as well as CellCept.  Patient also has a history of chronic combined heart failure, moderate malnutrition, low magnesium, chronic kidney disease stage IIIa, dyslipidemia, CAD and diabetes mellitus on oral agents.  Patient is receiving outpatient hospice care.  Patient was brought to the ER by family due to progressive weakness and inability to stand as well as increased O2 needs now at 4 L/min.  Patient also is reporting increased coughing and sputum production which is clear in nature.  In the ER patient was afebrile and vital signs were stable.  CT of the chest did not reveal any volume overload or focal opacities and revealed stable changes consistent with her known IPF.  She has remained stable on 4 L oxygen.  She has been given nebulizers and Solu-Medrol x1 with some improvement in symptoms.  In discussing with the patient she reports a significant history over the past 7 to 10 days.  She initially went out of town to stay with one of her daughters and while down there to passive time she began painting.  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  While she was painting she remains so busy she states she just did not eat or drink as appropriate.  Later that same daughter needed to travel to [facility] for a work related activity.  They stated a hotel.  Patient states she does not eat meat and the food at the hotel was "horrible".  She did attempt to drink tea but  again states she probably did not eat or drink normally.  Subsequently after these events patient has developed current symptoms.  She denies fever at home.  She does report weakness.  She is hoping to improve so she can attend a state pound well.  Patient reports she is [place] and otherwise her daughters and her deceased husband for part of the [place] of [place] in [place].  I did speak to her daughter who confirms the above history.  I did tell her that I felt the patient was dehydrated but given her history of ILD and heart failure I would not be given her IV fluids but would hold her diuretics and encourage oral intake of fluids.  Her daughter states that when given IV fluids in the past she easily becomes volume overloaded.  Of note her labs are consistent with a mild renal insufficiency/acute kidney injury with baseline creatinine 0.84 and current creatinine 1.33 also elevated BUN of 24.  Will be placed in inpatient status and hopefully can discharge within the next 48 hours.   Review of Systems: As mentioned in the history of present illness. All other systems reviewed and are negative. Past Medical History:  Diagnosis Date   Anemia 10/28/2010   unspecified   Anxiety    Aortic valve sclerosis    Arthritis    Asthma    Chronic combined systolic and diastolic CHF (congestive heart failure) ([place])    echo- 10/22 LVEF 45-55%, G1DD,  moderately reduced RV fxn, mild aortic regurg/stenosis   Chronic kidney disease    Chronic Kidney Disease---Stage III   Coronary artery disease 10/28/2010   DDD (degenerative disc disease), lumbar 2006   Diabetes type 2, controlled ([place]) 10/28/2010   Dyspnea    GERD (gastroesophageal reflux disease)    Gout 10/28/2010   Heart murmur    History of cardiac catheterization 2006   LAD 50%, D1 50% rest normal.    History of cardiovascular stress test 09/23/2009    lexiscan Normal Cardiolite test, EF 61%. LV size and function - normal   History of  echocardiogram 05/27/2008   EF 55%; Mild aortic insufficiency with minimal aortic sclerosis. L vent normal.    Hyperlipidemia    Hyperlipidemia LDL goal <100 10/28/2010   managed by Dr. Dorena Bodo   Hypertension 10/28/2010   IPF (idiopathic pulmonary fibrosis) ([place])    Progressive, with UIP   Mild vitamin D deficiency 02/22/2011   Obesity (BMI 30-39.9) 10/28/2010   unspecified   Osteoarthritis 10/28/2010   bilateral knees, left hip   Personal history of colonic polyps 10/28/2010   Shortness of breath    Syncope 2013   Vitamin B 12 deficiency 10/28/2010   Past Surgical History:  Procedure Laterality Date   BACK SURGERY  2006   DDD   BREAST BIOPSY Right    neg   CARDIAC CATHETERIZATION  2006   EYE SURGERY Bilateral    Cataract Extraction with IOL   JOINT REPLACEMENT Left    Left Total Hip Replacement   JOINT REPLACEMENT     RIGHT 2005; LEFT 2 X 2004, 2011   REPLACEMENT TOTAL KNEE Left    REVISION TOTAL KNEE ARTHROPLASTY Left    Dr. Holly Hill Right 12/12/2016   Procedure: TOTAL HIP ARTHROPLASTY ANTERIOR APPROACH;  Surgeon: Hessie Knows, MD;  Location: [facility] ORS;  Service: Orthopedics;  Laterality: Right;   TOTAL HIP ARTHROPLASTY Left 12/26/2013   Procedure: ARTHROPLASTY HIP TOTAL; Surgeon: Zorita Pang, MD; Location: [place]; Service: Orthopedics; Laterality: left,    TOTAL KNEE ARTHROPLASTY     both knees   Social History:  reports that she quit smoking about 31 years ago. Her smoking use included cigarettes. She has a 2.50 pack-year smoking history. She has never used smokeless tobacco. She reports that she does not drink alcohol and does not use drugs.  Allergies  Allergen Reactions   Morphine And Related Nausea And Vomiting    Family History  Problem Relation Age of Onset   Breast cancer Mother 57   Diabetes Mother    Stroke Mother    Stroke Brother    Breast cancer Cousin        pat cousin    Prior to Admission  medications   Medication Sig Start Date End Date Taking? Authorizing Provider  aspirin EC 81 MG EC tablet Take 1 tablet (81 mg total) by mouth daily. Swallow whole. 01/18/21  Yes Arrien, Jimmy Picket, MD  budesonide (PULMICORT) 0.25 MG/2ML nebulizer solution Take 2 mLs (0.25 mg total) by nebulization 2 (two) times daily. 03/10/21  Yes Swayze, Ava, DO  cholecalciferol (VITAMIN D) 1000 units tablet Take 1,000 Units by mouth daily.   Yes [provider]  digoxin (LANOXIN) 0.125 MG tablet Take 1 tablet (0.125 mg total) by mouth daily. 06/18/21  Yes Annita Brod, MD  DOCOSAHEXAENOIC ACID-EPA PO Take by mouth.   Yes [provider]  Ensure Max  Protein (ENSURE MAX PROTEIN) LIQD Take 330 mLs (11 oz total) by mouth at bedtime. 06/17/21  Yes Annita Brod, MD  feeding supplement, GLUCERNA SHAKE, (GLUCERNA SHAKE) LIQD Take 237 mLs by mouth 2 (two) times daily between meals. 06/18/21  Yes Annita Brod, MD  furosemide (LASIX) 20 MG tablet Take 1 tablet (20 mg total) by mouth daily. 06/18/21  Yes Annita Brod, MD  gabapentin (NEURONTIN) 100 MG capsule Take 100 mg by mouth 3 (three) times daily. 06/25/21  Yes [provider]  guaifenesin (ROBITUSSIN) 100 MG/5ML syrup Take 200 mg by mouth 4 (four) times daily as needed for cough.   Yes [provider]  JARDIANCE 25 MG TABS tablet Take 25 mg by mouth daily. 01/16/22  Yes [provider]  lidocaine (LIDODERM) 5 % Place 1 patch onto the skin daily. Remove & Discard patch within 12 hours or as directed by MD 06/18/21  Yes Annita Brod, MD  loratadine (CLARITIN) 10 MG tablet Take 10 mg by mouth daily.   Yes [provider]  magnesium oxide (MAG-OX) 400 MG tablet Take 1 tablet by mouth daily. 07/21/20  Yes [provider]  metoprolol succinate (TOPROL-XL) 25 MG 24 hr tablet Take 12.5 mg by mouth daily.   Yes [provider]  mirtazapine (REMERON) 15 MG tablet Take 15 mg by  mouth at bedtime. 06/02/21  Yes [provider]  mycophenolate (CELLCEPT) 250 MG capsule Take 1 capsule (250 mg total) by mouth 2 (two) times daily. 06/17/21  Yes Annita Brod, MD  NAC 600 MG CAPS Take 600 mg by mouth 2 (two) times daily. 05/17/21  Yes [provider]  Omega-3 Fatty Acids (FISH OIL) 1000 MG CAPS Take 1,000 mg by mouth daily.   Yes [provider]  omeprazole (PRILOSEC) 20 MG capsule Take 20 mg by mouth daily. 11/05/20  Yes [provider]  Pirfenidone 267 MG TABS Take 534 mg by mouth 3 (three) times daily.   Yes [provider]  sertraline (ZOLOFT) 25 MG tablet Take 25 mg by mouth daily. 06/03/21 06/03/22 Yes [provider]  sulfamethoxazole-trimethoprim (BACTRIM) 400-80 MG tablet Take 1 tablet by mouth 3 (three) times a week. 08/29/21  Yes [provider]  acetaminophen (TYLENOL) 325 MG tablet Take 2 tablets (650 mg total) by mouth every 6 (six) hours as needed (pain). 01/17/21   Arrien, Jimmy Picket, MD  albuterol (PROVENTIL) (2.5 MG/3ML) 0.083% nebulizer solution Take 3 mLs (2.5 mg total) by nebulization every 2 (two) hours as needed for wheezing or shortness of breath. 03/10/21   Swayze, Ava, DO  ALPRAZolam (XANAX) 0.25 MG tablet Take 1 tablet (0.25 mg total) by mouth 3 (three) times daily as needed for anxiety. 06/17/21   Annita Brod, MD  metFORMIN (GLUCOPHAGE) 850 MG tablet Take 850 mg by mouth 2 (two) times daily. Patient not taking: Reported on 01/20/2022 08/09/21   [provider]  polyethylene glycol (MIRALAX / GLYCOLAX) 17 g packet Take 17 g by mouth daily. 12/19/20   Shelly Coss, MD  predniSONE (DELTASONE) 10 MG tablet Take 10 mg by mouth daily. Patient not taking: Reported on 01/20/2022 08/29/21   [provider]  rosuvastatin (CRESTOR) 40 MG tablet Take 40 mg by mouth daily. Patient not taking: Reported on 09/14/2021 09/17/20   [provider]  sodium chloride (OCEAN) 0.65 %  SOLN nasal spray Place 2 sprays into both nostrils as needed for congestion. 03/10/21   Swayze, Ava, DO  enoxaparin (LOVENOX) 40 MG/0.4ML injection Inject 0.4 mLs (40 mg total) into the skin daily. 12/15/16 03/25/19  Duanne Guess, PA-C  pregabalin (LYRICA) 50 MG capsule Take 1 capsule (50 mg total) by mouth 3 (three) times daily. 12/18/16 03/25/19  Latanya Maudlin, NP  ranitidine (ZANTAC) 300 MG capsule Take 300 mg by mouth every evening.  03/25/19  [provider]  simvastatin (ZOCOR) 40 MG tablet Take 40 mg by mouth daily at 6 PM.  03/25/19  [provider]    Physical Exam: Vitals:   01/20/22 0230 01/20/22 0300 01/20/22 0330 01/20/22 0512  BP: (!) 123/59 113/65 (!) 93/53 128/66  Pulse: (!) 59 71 75 82  Resp: 20 18 20 20   Temp:      TempSrc:      SpO2: 100% 99% 100% 99%  Weight:      Height:       Constitutional: NAD, calm, comfortable Eyes: PERRL, lids and conjunctivae normal ENMT: Mucous membranes are dry. Posterior pharynx clear of any exudate or lesions.Normal dentition.  Neck: normal, supple, no masses, no thyromegaly Respiratory: Clear in upper airways but with fine expiratory crackles bilaterally from mid fields down.  4 Liters oxygen.  Normal respiratory effort. No accessory muscle use.  Cardiovascular: Regular rate and rhythm, no murmurs / rubs / gallops. No extremity edema. 2+ pedal pulses. No carotid bruits.  Abdomen: no tenderness, no masses palpated. No hepatosplenomegaly. Bowel sounds positive.  Musculoskeletal: no clubbing / cyanosis. No joint deformity upper and lower extremities. Good ROM, no contractures. Normal muscle tone.  Skin: no rashes, lesions, ulcers. No induration Neurologic: CN 2-12 grossly intact. Sensation intact, Strength 5/5 x all 4 extremities.  Psychiatric: Alert and oriented x 3. Normal mood.   Data Reviewed:  Laboratory data: Sodium 141, potassium 3.9, chloride 102, CO2 29, glucose 146, BUN 24, creatinine 1.33, LFTs normal,  albumin 3.4, GFR 40, troponin 47 and 48, lactic acid 1.7 and 1.8, white count 11,700, hemoglobin 13.3, platelets 273,000, influenza and COVID respiratory panel negative  Diagnostic data: CTA of chest: 1. No evidence of acute pulmonary embolus. 2. Chronic pulmonary Fibrosis and Bronchiectasis. No superimposed acute pulmonary abnormality. 3. Stable cardiomegaly. Calcified coronary artery and Aortic Atherosclerosis (ICD10-I70.0). Hiatal hernia. Chronic and benign appearing renal cysts.  2 view chest x-ray: 1. Chronic interstitial lung disease without definite acute superimposed airspace disease   Assessment and Plan: Acute on chronic respiratory failure with hypoxemia secondary to ILD flare Continue current O2 and attempt to try to taper back down to his line of 2 L/min High-dose IV steroids Solu-Medrol at 80 mg every 6 hours and titrate based on symptoms Continue budesonide nebs twice daily Albuterol nebs every 2 hours as needed Empiric Zithromax-hold preadmission prophylactic Bactrim for now Continue preadmission CellCept  Mild acute kidney injury on stage IIIa chronic kidney disease Encourage oral intake of fluid-patient becomes easily volume overloaded on IV fluids and since hemodynamically stable no indication for IV fluid Hold preadmission diuretic Follow labs  Mild to moderate dehydration As above  Known chronic combined heart failure (diastolic with HFrEF) Continue beta-blocker and digoxin Hold diuretic at least for 24 hours Strict I/O and daily weights  Diabetes mellitus on oral agents Continue Jardiance Hold preadmission metformin in the context of recent administration of IV contrast Follow CBGs AC/at bedtime Provide resistant SSI since receiving high-dose steroid Check hemoglobin A1c  Stable CAD Continue beta-blocker and baby aspirin as well as statin Mildly elevated troponin in the 40s likely related to  demand ischemia from recent hypoxemia  Anemia in  chronic disease Hemoglobin stable at 13.3  Moderate protein calorie malnutrition Continue preadmission Glucerna shakes  Advance Care Planning:   Code Status: Prior DNR-confirmed with daughter [name]  VTE prophylaxis: Lovenox  Consults: None  Family Communication: Daughter [name]  Severity of Illness: The appropriate patient status for this patient is INPATIENT. Inpatient status is judged to be reasonable and necessary in order to provide the required intensity of service to ensure the patient's safety. The patient's presenting symptoms, physical exam findings, and initial radiographic and laboratory data in the context of their chronic comorbidities is felt to place them at high risk for further clinical deterioration. Furthermore, it is not anticipated that the patient will be medically stable for discharge from the hospital within 2 midnights of admission.   * I certify that at the point of admission it is my clinical judgment that the patient will require inpatient hospital care spanning beyond 2 midnights from the point of admission due to high intensity of service, high risk for further deterioration and high frequency of surveillance required.*  Author: Erin Hearing, NP 01/20/2022 7:14 AM  For on call review [email or web address].

## 2022-01-21 DIAGNOSIS — J9621 Acute and chronic respiratory failure with hypoxia: Secondary | ICD-10-CM | POA: Diagnosis not present

## 2022-01-21 LAB — CBC
HCT: 36.4 % (ref 36.0–46.0)
Hemoglobin: 11.4 g/dL — ABNORMAL LOW (ref 12.0–15.0)
MCH: 29.9 pg (ref 26.0–34.0)
MCHC: 31.3 g/dL (ref 30.0–36.0)
MCV: 95.5 fL (ref 80.0–100.0)
Platelets: 244 10*3/uL (ref 150–400)
RBC: 3.81 MIL/uL — ABNORMAL LOW (ref 3.87–5.11)
RDW: 12.8 % (ref 11.5–15.5)
WBC: 10.8 10*3/uL — ABNORMAL HIGH (ref 4.0–10.5)
nRBC: 0 % (ref 0.0–0.2)

## 2022-01-21 LAB — BASIC METABOLIC PANEL
Anion gap: 7 (ref 5–15)
BUN: 38 mg/dL — ABNORMAL HIGH (ref 8–23)
CO2: 27 mmol/L (ref 22–32)
Calcium: 9.5 mg/dL (ref 8.9–10.3)
Chloride: 101 mmol/L (ref 98–111)
Creatinine, Ser: 0.9 mg/dL (ref 0.44–1.00)
GFR, Estimated: >60 mL/min (ref >60)
Glucose, Bld: 197 mg/dL — ABNORMAL HIGH (ref 70–99)
Potassium: 4.4 mmol/L (ref 3.5–5.1)
Sodium: 135 mmol/L (ref 135–145)

## 2022-01-21 LAB — GLUCOSE, CAPILLARY
Glucose-Capillary: 175 mg/dL — ABNORMAL HIGH (ref 70–99)
Glucose-Capillary: 224 mg/dL — ABNORMAL HIGH (ref 70–99)

## 2022-01-21 MED ORDER — AZITHROMYCIN 250 MG PO TABS: ORAL_TABLET | ORAL | 0 refills | Status: AC

## 2022-01-21 MED ORDER — PREDNISONE 50 MG PO TABS: ORAL_TABLET | ORAL | 0 refills | Status: DC

## 2022-01-21 MED ORDER — ENOXAPARIN SODIUM 40 MG/0.4ML IJ SOSY: 40.0000 mg | PREFILLED_SYRINGE | INTRAMUSCULAR | Status: DC

## 2022-01-21 NOTE — Plan of Care (Signed)

## 2022-01-21 NOTE — TOC Initial Note (Signed)
Transition of Care [facility]) - Initial/Assessment Note    Patient Details  Name: Sierra Guzman MRN: [id number] Date of Birth: 1940/12/24  [facility]) CM/SW Contact:    Magnus Ivan, LCSW Phone Number: 01/21/2022, 1:03 PM  Clinical Narrative:                 Patient is medically ready to DC home today.  CSW spoke with patient who also requested CSW call daughter [name] which was completed. CSW also called [facility] [phone], spoke with Mickel Baas who confirmed patient is active with them and reported she will have the local team call CSW today.  Patient lives home with [place]. Patient reported she has all needed DME at home including a wheelchair, hospital bed, oxygen, and a walker.  Patient's 3 daughters are involved and supportive, also has support from neighbor, niece. Patient states she feels she has good support at home. Patient has an aide through hospice 3 times per week per [name].  PT has recommended home PT. Patient and daughter [name] verbalized understanding that inurance would not cover home health and hospice, they both stated they want to continue with [facility] rather than getting home health. They are agreeable to CSW asking if [facility] can provide PT services but understand it is not guaranteed. Patient stated her family also exercises with her at home. CSW will relay this PT request to [place] when they return the call. CSW has also sent secure email for weekday [place] to inform her of PT request in case [facility] does not call back today. Daughter [name] to transport home today, asked RN to call [name] when patient is ready for transport home.      Expected Discharge Plan: Home w Hospice Care Barriers to Discharge: Barriers Resolved   Patient Goals and CMS Choice Patient states their goals for this hospitalization and ongoing recovery are:: home with hospice and family support CMS Medicare.gov Compare Post Acute Care list provided to::  Patient Choice offered to / list presented to : Patient, Adult Children  Expected Discharge Plan and Services Expected Discharge Plan: [place] Acute Care Choice: Hospice Living arrangements for the past 2 months: Single Family Home                                      Prior Living Arrangements/Services Living arrangements for the past 2 months: Single Family Home Lives with:: Adult Children Patient language and need for interpreter reviewed:: Yes Do you feel safe going back to the place where you live?: Yes      Need for Family Participation in Patient Care: Yes (Comment) Care giver support system in place?: Yes (comment) Current home services: DME, Hospice Criminal Activity/Legal Involvement Pertinent to Current Situation/Hospitalization: No - Comment as needed  Activities of Daily Living Home Assistive Devices/Equipment: Dentures (specify type), Walker (specify type), Eyeglasses ADL Screening (condition at time of admission) Patient's cognitive ability adequate to safely complete daily activities?: Yes Is the patient deaf or have difficulty hearing?: No Does the patient have difficulty seeing, even when wearing glasses/contacts?: No Does the patient have difficulty concentrating, remembering, or making decisions?: No Patient able to express need for assistance with ADLs?: Yes Does the patient have difficulty dressing or bathing?: Yes Independently performs ADLs?: No Communication: Independent Dressing (OT): Independent Grooming: Independent Feeding: Independent Bathing: Needs assistance Is this  a change from baseline?: Pre-admission baseline Toileting: Independent (with rollator) In/Out Bed: Independent Is this a change from baseline?: Pre-admission baseline Walks in Home: Needs assistance Is this a change from baseline?: Pre-admission baseline Does the patient have difficulty walking or climbing stairs?: Yes Weakness of Legs: Both Weakness  of Arms/Hands: Both  Permission Sought/Granted Permission sought to share information with : Facility Sport and exercise psychologist, Family Supports Permission granted to share information with : Yes, Verbal Permission Granted     Permission granted to share info w AGENCY: [place] granted to share info w Relationship: daughters [name] and [place]     Emotional Assessment       Orientation: : Oriented to Self, Oriented to Place, Oriented to  Time, Oriented to Situation Alcohol / Substance Use: Not Applicable Psych Involvement: No (comment)  Admission diagnosis:  Shortness of breath [R06.02] Interstitial lung disease ([place]) [J84.9] ILD (interstitial lung disease) ([place]) [J84.9] Generalized weakness [R53.1] Acute on chronic respiratory failure with hypoxemia ([place]) [J96.21] Patient Active Problem List   Diagnosis Date Noted   Periprosthetic hip fracture, initial encounter 09/14/2021   Malnutrition of moderate degree 06/15/2021   Multifocal atrial tachycardia    HFrEF (heart failure with reduced ejection fraction) (H[facility])    Elevated liver function tests 06/12/2021   Hypomagnesemia 06/11/2021   Hypotension 06/11/2021   Interstitial lung disease ([place]) 06/11/2021   Acute on chronic combined systolic and diastolic CHF (congestive heart failure) ([place]) 06/10/2021   Influenza A with pneumonia 03/07/2021   Acute on chronic respiratory failure with hypoxemia ([place]) 03/07/2021   Pressure injury of skin 01/13/2021   Chronic kidney disease, stage 3a ([place]) 01/01/2021   Chronic diastolic CHF (congestive heart failure) ([place]) 12/27/2020   AKI (acute kidney injury) ([place]) 12/27/2020   IPF (idiopathic pulmonary fibrosis) ([place]) 12/27/2020   Sacral decubitus ulcer 12/27/2020   Occult blood positive stool 12/27/2020   DDD (degenerative disc disease), cervical 12/21/2020   Hereditary and idiopathic peripheral neuropathy 12/21/2020   Localized, primary osteoarthritis 12/21/2020   Low back  pain 12/21/2020   Pain in limb 12/21/2020   Chronic respiratory failure with hypoxia ([place]) 12/05/2020   Acute on chronic respiratory failure with hypoxia ([place]) 12/05/2020   Hip pain 08/06/2020   Spinal stenosis in cervical region 08/06/2020   Spondylolisthesis, congenital 08/06/2020   Status post total hip replacement, 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   Primary localized osteoarthritis of right hip 12/12/2016   B12 deficiency 10/28/2010   Coronary artery disease 10/28/2010   Uncontrolled type 2 diabetes mellitus with hyperglycemia, without long-term current use of insulin ([place]) 10/28/2010   Gout 10/28/2010   Hyperlipidemia with target LDL less than 100 10/28/2010   Hypertension 10/28/2010   Osteoarthritis of left knee 10/28/2010   Anemia of chronic disease 10/28/2010   History of colonic polyps 10/28/2010   PCP:  Wardell Honour, MD Pharmacy:   [id number] [place] [address] Phone: [phone] Fax: [phone]     Social Determinants of Health (SDOH) Interventions    Readmission Risk Interventions    06/13/2021    1:06 PM 03/09/2021   12:37 PM  Readmission Risk Prevention Plan  Transportation Screening Complete Complete  PCP or Specialist Appt within 3-5 Days  Complete  HRI or [place]  Complete  Social Work Consult for [place] Planning/Counseling  Complete  Palliative Care Screening  Not Applicable  Medication Review Press photographer) Complete Complete  PCP or Specialist  appointment within 3-5 days of discharge Complete   HRI or [place] Complete   SW Recovery Care/Counseling Consult Complete   Palliative Care Screening Not [place] Patient Refused

## 2022-01-21 NOTE — Hospital Course (Addendum)
Taken from H&P.  Sierra Guzman is a 81 y.o. 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  Chronically on 2 L oxygen per minute and is on suppressive therapy with Bactrim as well as CellCept.  Patient also has a history of chronic combined heart failure, moderate malnutrition, low magnesium, chronic kidney disease stage IIIa, dyslipidemia, CAD and diabetes mellitus on oral agents.  Patient is receiving outpatient hospice care.  Patient was brought to the ER by family due to progressive weakness and inability to stand as well as increased O2 needs now at 4 L/min.  Patient also is reporting increased coughing and sputum production which is clear in nature.  Also had poor p.o. intake.  ED course.  Hemodynamically stable but requiring up to 4 L of oxygen.  Labs pertinent for mild increase in creatinine to 1.33, mild leukocytosis at 11 700.  COVID and flu PCR negative. CXR with chronic interstitial lung disease and no evidence of any acute superimposed airspace abnormality. CTA chest was negative for PE.  Did showed chronic pulmonary fibrosis and bronchiectasis.  No superimposed acute abnormality and stable cardiomegaly.  She was admitted for acute on chronic respiratory failure with hypoxia most likely secondary to ILD flare.  She received Solu-Medrol and breathing treatments.  10/14: Hemodynamically stable, saturating 100% on 3 L of oxygen which is around her baseline, she is currently using 3 to 4 L of oxygen at home. CBG at 175, creatinine has been normalized to 0.90, leukocytosis improving.  Troponin barely positive at 47.48 with a flat curve. Patient think that she is at her baseline now and would like to go home.  She is being discharged on 5 days of prednisone after that she will resume her home dose of 10 mg daily.  She is also being given a course of Zithromax.  She will continue with the rest of her home medications and need to have  a close follow-up with her providers for further recommendations.  It is currently active with hospice services which she will continue on discharge.

## 2022-01-21 NOTE — Discharge Summary (Signed)
Physician Discharge Summary   Patient: Sierra Guzman MRN: [id number] DOB: 07-Jun-1940  Admit date:     01/20/2022  Discharge date: 01/21/22  Discharge Physician: Lorella Nimrod   PCP: Wardell Honour, MD   Recommendations at discharge:  Please obtain CBC and BMP in 1 week To ensure the completion of a 5-day course of steroid and restart home dose afterwards. Follow-up with pulmonology within a week With primary care provider Continue outpatient hospice services  Discharge Diagnoses: Principal Problem:   Acute on chronic respiratory failure with hypoxemia ([place]) Active Problems:   Interstitial lung disease ([place])   Chronic kidney disease, stage 3a ([place])   Anemia of chronic disease   Coronary artery disease   Malnutrition of moderate degree   Hyperlipidemia with target LDL less than 100   Chronic respiratory failure with hypoxia ([facility])   Chronic diastolic CHF (congestive heart failure) ([facility])   IPF (idiopathic pulmonary fibrosis) ([facility])   HFrEF (heart failure with reduced ejection fraction) [facility])   Hospital Course: Taken from H&P.  Sierra Guzman is a 81 y.o. 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  Chronically on 2 L oxygen per minute and is on suppressive therapy with Bactrim as well as CellCept.  Patient also has a history of chronic combined heart failure, moderate malnutrition, low magnesium, chronic kidney disease stage IIIa, dyslipidemia, CAD and diabetes mellitus on oral agents.  Patient is receiving outpatient hospice care.  Patient was brought to the ER by family due to progressive weakness and inability to stand as well as increased O2 needs now at 4 L/min.  Patient also is reporting increased coughing and sputum production which is clear in nature.  Also had poor p.o. intake.  ED course.  Hemodynamically stable but requiring up to 4 L of oxygen.  Labs pertinent for mild increase in creatinine to 1.33, mild  leukocytosis at 11 700.  COVID and flu PCR negative. CXR with chronic interstitial lung disease and no evidence of any acute superimposed airspace abnormality. CTA chest was negative for PE.  Did showed chronic pulmonary fibrosis and bronchiectasis.  No superimposed acute abnormality and stable cardiomegaly.  She was admitted for acute on chronic respiratory failure with hypoxia most likely secondary to ILD flare.  She received Solu-Medrol and breathing treatments.  10/14: Hemodynamically stable, saturating 100% on 3 L of oxygen which is around her baseline, she is currently using 3 to 4 L of oxygen at home. CBG at 175, creatinine has been normalized to 0.90, leukocytosis improving.  Troponin barely positive at 47.48 with a flat curve. Patient think that she is at her baseline now and would like to go home.  She is being discharged on 5 days of prednisone after that she will resume her home dose of 10 mg daily.  She is also being given a course of Zithromax.  She will continue with the rest of her home medications and need to have a close follow-up with her providers for further recommendations.  It is currently active with hospice services which she will continue on discharge.       Consultants: None Procedures performed: None Disposition: Home Diet recommendation:  Discharge Diet Orders (From admission, onward)     Start     Ordered   01/21/22 0000  Diet - low sodium heart healthy        01/21/22 1315           Cardiac and Carb modified  diet DISCHARGE MEDICATION: Allergies as of 01/21/2022       Reactions   Morphine And Related Nausea And Vomiting        Medication List     TAKE these medications    acetaminophen 325 MG tablet Commonly known as: TYLENOL Take 2 tablets (650 mg total) by mouth every 6 (six) hours as needed (pain).   albuterol (2.5 MG/3ML) 0.083% nebulizer solution Commonly known as: PROVENTIL Take 3 mLs (2.5 mg total) by nebulization every 2 (two)  hours as needed for wheezing or shortness of breath.   ALPRAZolam 0.25 MG tablet Commonly known as: XANAX Take 1 tablet (0.25 mg total) by mouth 3 (three) times daily as needed for anxiety.   aspirin EC 81 MG tablet Take 1 tablet (81 mg total) by mouth daily. Swallow whole.   azithromycin 250 MG tablet Commonly known as: Zithromax Z-Pak Take 2 tablets (500 mg) on  Day 1,  followed by 1 tablet (250 mg) once daily on Days 2 through 5.   budesonide 0.25 MG/2ML nebulizer solution Commonly known as: PULMICORT Take 2 mLs (0.25 mg total) by nebulization 2 (two) times daily.   cholecalciferol 1000 units tablet Commonly known as: VITAMIN D Take 1,000 Units by mouth daily.   digoxin 0.125 MG tablet Commonly known as: LANOXIN Take 1 tablet (0.125 mg total) by mouth daily.   DOCOSAHEXAENOIC ACID-EPA PO Take by mouth.   Ensure Max Protein Liqd Take 330 mLs (11 oz total) by mouth at bedtime.   feeding supplement (GLUCERNA SHAKE) Liqd Take 237 mLs by mouth 2 (two) times daily between meals.   Fish Oil 1000 MG Caps Take 1,000 mg by mouth daily.   furosemide 20 MG tablet Commonly known as: LASIX Take 1 tablet (20 mg total) by mouth daily.   gabapentin 100 MG capsule Commonly known as: NEURONTIN Take 100 mg by mouth 3 (three) times daily.   guaifenesin 100 MG/5ML syrup Commonly known as: ROBITUSSIN Take 200 mg by mouth 4 (four) times daily as needed for cough.   Jardiance 25 MG Tabs tablet Generic drug: empagliflozin Take 25 mg by mouth daily.   lidocaine 5 % Commonly known as: LIDODERM Place 1 patch onto the skin daily. Remove & Discard patch within 12 hours or as directed by MD   loratadine 10 MG tablet Commonly known as: CLARITIN Take 10 mg by mouth daily.   magnesium oxide 400 MG tablet Commonly known as: MAG-OX Take 1 tablet by mouth daily.   metFORMIN 850 MG tablet Commonly known as: GLUCOPHAGE Take 850 mg by mouth 2 (two) times daily.   metoprolol succinate  25 MG 24 hr tablet Commonly known as: TOPROL-XL Take 12.5 mg by mouth daily.   mirtazapine 15 MG tablet Commonly known as: REMERON Take 15 mg by mouth at bedtime.   mycophenolate 250 MG capsule Commonly known as: CELLCEPT Take 1 capsule (250 mg total) by mouth 2 (two) times daily.   NAC 600 MG Caps Generic drug: Acetylcysteine Take 600 mg by mouth 2 (two) times daily.   omeprazole 20 MG capsule Commonly known as: PRILOSEC Take 20 mg by mouth daily.   Pirfenidone 267 MG Tabs Take 534 mg by mouth 3 (three) times daily.   polyethylene glycol 17 g packet Commonly known as: MIRALAX / GLYCOLAX Take 17 g by mouth daily.   predniSONE 10 MG tablet Commonly known as: DELTASONE Take 10 mg by mouth daily. What changed: Another medication with the same name was added. Make  sure you understand how and when to take each.   predniSONE 50 MG tablet Commonly known as: DELTASONE Take 1 tablet daily for next 5 days and then resume your home dose What changed: You were already taking a medication with the same name, and this prescription was added. Make sure you understand how and when to take each.   rosuvastatin 40 MG tablet Commonly known as: CRESTOR Take 40 mg by mouth daily.   sertraline 25 MG tablet Commonly known as: ZOLOFT Take 25 mg by mouth daily.   sodium chloride 0.65 % Soln nasal spray Commonly known as: OCEAN Place 2 sprays into both nostrils as needed for congestion.   sulfamethoxazole-trimethoprim 400-80 MG tablet Commonly known as: BACTRIM Take 1 tablet by mouth 3 (three) times a week.               Discharge Care Instructions  (From admission, onward)           Start     Ordered   01/21/22 0000  No dressing needed        01/21/22 1315            Follow-up Information     Wardell Honour, MD. Schedule an appointment as soon as possible for a visit in 1 week(s).   Specialty: Family Medicine Contact information: [place] [address] [phone]         Ottie Glazier, MD. Schedule an appointment as soon as possible for a visit in 1 week(s).   Specialty: Pulmonary Disease Contact information: [place] [address] [phone]                Discharge Exam: Danley Danker Weights   01/19/22 2031 01/20/22 0208 01/21/22 0500  Weight: 64 kg 64 kg 60.7 kg   General.  Frail and malnourished elderly lady, in no acute distress. Pulmonary.  Few scattered dry crackles bilaterally, normal respiratory effort. CV.  Regular rate and rhythm, no JVD, rub or murmur. Abdomen.  Soft, nontender, nondistended, BS positive. CNS.  Alert and oriented .  No focal neurologic deficit. Extremities.  No edema, no cyanosis, pulses intact and symmetrical. Psychiatry.  Judgment and insight appears normal.   Condition at discharge: stable  The results of significant diagnostics from this hospitalization (including imaging, microbiology, ancillary and laboratory) are listed below for reference.   Imaging Studies: CT Angio Chest PE W and/or Wo Contrast  Result Date: 01/20/2022 CLINICAL DATA:  81 year old female with weakness, chest pain, productive cough. Pulmonary fibrosis. EXAM: CT ANGIOGRAPHY CHEST WITH CONTRAST TECHNIQUE: Multidetector CT imaging of the chest was performed using the standard protocol during bolus administration of intravenous contrast. Multiplanar CT image reconstructions and MIPs were obtained to evaluate the vascular anatomy. RADIATION DOSE REDUCTION: This exam was performed according to the departmental dose-optimization program which includes automated exposure control, adjustment of the mA and/or kV according to patient size and/or use of iterative reconstruction technique. CONTRAST:  39mL OMNIPAQUE IOHEXOL 350 MG/ML SOLN COMPARISON:  Chest CTA 06/10/2021.  Chest radiographs 01/19/2022. FINDINGS: Cardiovascular: Excellent contrast bolus timing in the pulmonary  arterial tree. No focal filling defect identified in the pulmonary arteries to suggest acute pulmonary some chronic enlargement of the central pulmonary arteries compatible with sequelae of chronic lung disease with a degree of pulmonary hypertension (series 6, image 148). Calcified aortic atherosclerosis. Calcified coronary artery atherosclerosis (series 6, image 170). Mild cardiomegaly. No pericardial effusion. Mediastinum/Nodes: Stable reactive appearing mediastinal, mostly anterior carina  lymph nodes since March. Lungs/Pleura: Lung volumes have not significantly changed since March. Diffuse bilateral pulmonary interstitial lung disease with central ground-glass opacity and peripheral, basilar reticular opacity appears stable. Some associated bronchiectasis. No convincing pleural effusion or pneumonia. Upper Abdomen: Small to moderate gastric hiatal hernia is stable. Left upper pole exophytic renal cyst with mildly complex fluid density was evaluated on renal ultrasound 01/12/2021 and had a benign appearance at that time with no interval enlargement (no follow-up imaging recommended). Larger right midpole renal cyst partially identified with simple fluid density, and also had a benign appearance 1 year ago. Negative visible liver, spleen, pancreas, adrenal glands, and intestines in the upper abdomen. Musculoskeletal: Stable visualized osseous structures. Chronic loss of the T7-T8 and T8-T9 disc spaces. Review of the MIP images confirms the above findings. IMPRESSION: 1. No evidence of acute pulmonary embolus. 2. Chronic pulmonary Fibrosis and Bronchiectasis. No superimposed acute pulmonary abnormality. 3. Stable cardiomegaly. Calcified coronary artery and Aortic Atherosclerosis (ICD10-I70.0). Hiatal hernia. Chronic and benign appearing renal cysts. Electronically Signed   By: Genevie Ann M.D.   On: 01/20/2022 04:20   DG Chest 2 View  Result Date: 01/19/2022 CLINICAL DATA:  Weakness EXAM: CHEST - 2 VIEW  COMPARISON:  09/14/2021, CT 06/10/2021, 03/10/2021 FINDINGS: Diffuse bilateral reticular interstitial opacity and mild ground-glass corresponding to chronic interstitial lung disease. No acute superimposed airspace disease, pleural effusion or pneumothorax. Stable cardiomediastinal silhouette. IMPRESSION: 1. Chronic interstitial lung disease without definite acute superimposed airspace disease Electronically Signed   By: Donavan Foil M.D.   On: 01/19/2022 21:05    Microbiology: Results for orders placed or performed during the hospital encounter of 01/20/22  Resp Panel by RT-PCR (Flu A&B, Covid) Anterior Nasal Swab     Status: None   Collection Time: 01/19/22  8:34 PM   Specimen: Anterior Nasal Swab  Result Value Ref Range Status   SARS Coronavirus 2 by RT PCR NEGATIVE NEGATIVE Final    Comment: (NOTE) SARS-CoV-2 target nucleic acids are NOT DETECTED.  The SARS-CoV-2 RNA is generally detectable in upper respiratory specimens during the acute phase of infection. The lowest concentration of SARS-CoV-2 viral copies this assay can detect is 138 copies/mL. A negative result does not preclude SARS-Cov-2 infection and should not be used as the sole basis for treatment or other patient management decisions. A negative result may occur with  improper specimen collection/handling, submission of specimen other than nasopharyngeal swab, presence of viral mutation(s) within the areas targeted by this assay, and inadequate number of viral copies(<138 copies/mL). A negative result must be combined with clinical observations, patient history, and epidemiological information. The expected result is Negative.  Fact Sheet for Patients:  EntrepreneurPulse.com.au  Fact Sheet for Healthcare Providers:  IncredibleEmployment.be  This test is no t yet approved or cleared by the [place] FDA and  has been authorized for detection and/or diagnosis of SARS-CoV-2 by FDA  under an Emergency Use Authorization (EUA). This EUA will remain  in effect (meaning this test can be used) for the duration of the COVID-19 declaration under Section 564(b)(1) of the Act, 21 U.S.C.section 360bbb-3(b)(1), unless the authorization is terminated  or revoked sooner.       Influenza A by PCR NEGATIVE NEGATIVE Final   Influenza B by PCR NEGATIVE NEGATIVE Final    Comment: (NOTE) The Xpert Xpress SARS-CoV-2/FLU/RSV plus assay is intended as an aid in the diagnosis of influenza from Nasopharyngeal swab specimens and should not be used as a sole basis for treatment. Nasal  washings and aspirates are unacceptable for Xpert Xpress SARS-CoV-2/FLU/RSV testing.  Fact Sheet for Patients: EntrepreneurPulse.com.au  Fact Sheet for Healthcare Providers: IncredibleEmployment.be  This test is not yet approved or cleared by the [place] FDA and has been authorized for detection and/or diagnosis of SARS-CoV-2 by FDA under an Emergency Use Authorization (EUA). This EUA will remain in effect (meaning this test can be used) for the duration of the COVID-19 declaration under Section 564(b)(1) of the Act, 21 U.S.C. section 360bbb-3(b)(1), unless the authorization is terminated or revoked.  Performed at [facility], [address] [place], [place] [address]     Labs: CBC: Recent Labs  Lab 01/19/22 2034 01/20/22 1834 01/21/22 0458  WBC 11.7* 9.8 10.8*  NEUTROABS 8.3*  --   --   HGB 13.3 11.7* 11.4*  HCT 44.4 38.3 36.4  MCV 99.3 96.5 95.5  PLT 273 250 580   Basic Metabolic Panel: Recent Labs  Lab 01/20/22 0205 01/20/22 1834 01/21/22 0458  NA 141  --  135  K 3.9  --  4.4  CL 102  --  101  CO2 29  --  27  GLUCOSE 146*  --  197*  BUN 24*  --  38*  CREATININE 1.33* 1.15* 0.90  CALCIUM 9.5  --  9.5   Liver Function Tests: Recent Labs  Lab 01/20/22 0205  AST 22  ALT 9  ALKPHOS 58  BILITOT 0.7  PROT 6.4*  ALBUMIN  3.4*   CBG: Recent Labs  Lab 01/20/22 1129 01/20/22 1722 01/20/22 2148 01/21/22 0823 01/21/22 1126  GLUCAP 239* 130* 209* 175* 224*    Discharge time spent: greater than 30 minutes.  This record has been created using Systems analyst. Errors have been sought and corrected,but may not always be located. Such creation errors do not reflect on the standard of care.   Signed: Lorella Nimrod, MD [facility] 01/21/2022

## 2022-01-21 NOTE — Evaluation (Signed)
Physical Therapy Evaluation Patient Details Name: SHERILL ARREOLA MRN: [id number] DOB: [birth year] Today's Date: 01/21/2022  History of Present Illness  ANAB GUILFOYLE is a 81 y.o. 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  Chronically on 2 L oxygen per minute and is on suppressive therapy with Bactrim as well as CellCept.  Patient also has a history of chronic combined heart failure, moderate malnutrition, low magnesium, chronic kidney disease stage IIIa, dyslipidemia, CAD and diabetes mellitus on oral agents.  Patient is receiving outpatient hospice care.  Patient was brought to the ER by family due to progressive weakness and inability to stand as well as increased O2 needs now at 4 L/min.  Patient also is reporting increased coughing and sputum production which is clear in nature.  Clinical Impression  Patient presents today with good motivation for moving. Alert and oriented and able to follow all commands in no acute distress at rest on 3L. Presented with modified independence with all bed mobility and supervision with transfers using bed rail and use of RW for mobility. She presented with some BLE muscle weakness and some dyspnea with exertion- approx 80 feet on 3L with O2 sat dropping to 86% but quickly returned to 92% with pursed lip breathing. She will benefit from skilled PT interventions to assist with functional strength and mobility to decrease risk of falling or rehospitalization. Recommending some form of PT in the home setting - with current Hospice system or HHPT.        Recommendations for follow up therapy are one component of a multi-disciplinary discharge planning process, led by the attending physician.  Recommendations may be updated based on patient status, additional functional criteria and insurance authorization.  Follow Up Recommendations Home health PT      Assistance Recommended at Discharge Frequent or  constant Supervision/Assistance  Patient can return home with the following  A little help with walking and/or transfers;Assistance with cooking/housework;A lot of help with bathing/dressing/bathroom;Assistance with feeding;Direct supervision/assist for financial management;Assist for transportation;Help with stairs or ramp for entrance;Direct supervision/assist for medications management    Equipment Recommendations None recommended by PT  Recommendations for Other Services       Functional Status Assessment Patient has had a recent decline in their functional status and demonstrates the ability to make significant improvements in function in a reasonable and predictable amount of time.     Precautions / Restrictions Precautions Precautions: Fall Restrictions Weight Bearing Restrictions: No Other Position/Activity Restrictions: O2 dependent      Mobility  Bed Mobility Overal bed mobility: Modified Independent               Patient Response: Cooperative  Transfers Overall transfer level: Modified independent Equipment used: Rolling walker (2 wheels)                    Ambulation/Gait Ambulation/Gait assistance: Min guard Gait Distance (Feet): 80 Feet Assistive device: Rolling walker (2 wheels) Gait Pattern/deviations: Step-through pattern, Decreased step length - right, Decreased step length - left   Gait velocity interpretation: <1.8 ft/sec, indicate of risk for recurrent falls      Stairs            Wheelchair Mobility    Modified Rankin (Stroke Patients Only)       Balance Overall balance assessment: Mild deficits observed, not formally tested Sitting-balance support: Bilateral upper extremity supported Sitting balance-Leahy Scale: Good     Standing balance support: Bilateral upper  extremity supported Standing balance-Leahy Scale: Good                               Pertinent Vitals/Pain Pain Assessment Pain Assessment:  No/denies pain    Home Living                          Prior Function                       Hand Dominance        Extremity/Trunk Assessment   Upper Extremity Assessment Upper Extremity Assessment: Overall WFL for tasks assessed    Lower Extremity Assessment Lower Extremity Assessment: Generalized weakness    Cervical / Trunk Assessment Cervical / Trunk Assessment:  (Forward head/protracted shoulders)  Communication      Cognition Arousal/Alertness: Awake/alert Behavior During Therapy: WFL for tasks assessed/performed Overall Cognitive Status: Within Functional Limits for tasks assessed                                 General Comments: Alert and oriented to name, date of birth, location, and situation        General Comments      Exercises     Assessment/Plan    PT Assessment Patient needs continued PT services  PT Problem List Decreased strength;Decreased activity tolerance;Decreased balance;Decreased mobility;Decreased coordination;Cardiopulmonary status limiting activity       PT Treatment Interventions Gait training;DME instruction;Stair training;Functional mobility training;Therapeutic activities;Patient/family education;Neuromuscular re-education;Balance training;Therapeutic exercise    PT Goals (Current goals can be found in the Care Plan section)  Acute Rehab PT Goals Patient Stated Goal: I want to return home and breathe better. PT Goal Formulation: With patient Time For Goal Achievement: 02/04/22 Potential to Achieve Goals: Good    Frequency Min 2X/week     Co-evaluation               AM-PAC PT "6 Clicks" Mobility  Outcome Measure Help needed turning from your back to your side while in a flat bed without using bedrails?: None Help needed moving from lying on your back to sitting on the side of a flat bed without using bedrails?: None Help needed moving to and from a bed to a chair (including a  wheelchair)?: A Little Help needed standing up from a chair using your arms (e.g., wheelchair or bedside chair)?: A Little Help needed to walk in hospital room?: A Little Help needed climbing 3-5 steps with a railing? : A Lot 6 Click Score: 19    End of Session Equipment Utilized During Treatment: Gait belt Activity Tolerance: Patient tolerated treatment well;Other (comment) (Limited with some Shortness of breath) Patient left: in bed;with call bell/phone within reach;with bed alarm set Nurse Communication: Mobility status PT Visit Diagnosis: Other abnormalities of gait and mobility (R26.89);Unsteadiness on feet (R26.81);Muscle weakness (generalized) (M62.81);Difficulty in walking, not elsewhere classified (R26.2)    Time: 9326-7124 PT Time Calculation (min) (ACUTE ONLY): 25 min   Charges:   PT Evaluation $PT Eval Low Complexity: 1 Low PT Treatments $Therapeutic Activity: 8-22 mins       Ollen Bowl, PT 01/21/2022, 1:59 PM

## 2022-07-26 ENCOUNTER — Inpatient Hospital Stay: Payer: Medicare Other

## 2022-07-26 ENCOUNTER — Inpatient Hospital Stay
Admission: EM | Admit: 2022-07-26 | Discharge: 2022-08-01 | DRG: 291 | Disposition: A | Discharge status: Skilled Nursing Facility | Payer: Medicare Other | Source: Skilled Nursing Facility | Attending: Osteopathic Medicine | Admitting: Osteopathic Medicine

## 2022-07-26 ENCOUNTER — Emergency Department: Payer: Medicare Other

## 2022-07-26 ENCOUNTER — Other Ambulatory Visit: Payer: Self-pay

## 2022-07-26 ENCOUNTER — Encounter: Payer: Self-pay | Admitting: Internal Medicine

## 2022-07-26 DIAGNOSIS — E1122 Type 2 diabetes mellitus with diabetic chronic kidney disease: Secondary | ICD-10-CM | POA: Diagnosis present

## 2022-07-26 DIAGNOSIS — F32A Depression, unspecified: Secondary | ICD-10-CM | POA: Diagnosis present

## 2022-07-26 DIAGNOSIS — Z833 Family history of diabetes mellitus: Secondary | ICD-10-CM

## 2022-07-26 DIAGNOSIS — Z1152 Encounter for screening for COVID-19: Secondary | ICD-10-CM

## 2022-07-26 DIAGNOSIS — R300 Dysuria: Secondary | ICD-10-CM | POA: Diagnosis present

## 2022-07-26 DIAGNOSIS — M5030 Other cervical disc degeneration, unspecified cervical region: Secondary | ICD-10-CM | POA: Diagnosis present

## 2022-07-26 DIAGNOSIS — Z79899 Other long term (current) drug therapy: Secondary | ICD-10-CM

## 2022-07-26 DIAGNOSIS — R6510 Systemic inflammatory response syndrome (SIRS) of non-infectious origin without acute organ dysfunction: Secondary | ICD-10-CM

## 2022-07-26 DIAGNOSIS — R64 Cachexia: Secondary | ICD-10-CM | POA: Diagnosis present

## 2022-07-26 DIAGNOSIS — I2510 Atherosclerotic heart disease of native coronary artery without angina pectoris: Secondary | ICD-10-CM | POA: Diagnosis present

## 2022-07-26 DIAGNOSIS — Z66 Do not resuscitate: Secondary | ICD-10-CM | POA: Diagnosis present

## 2022-07-26 DIAGNOSIS — I130 Hypertensive heart and chronic kidney disease with heart failure and stage 1 through stage 4 chronic kidney disease, or unspecified chronic kidney disease: Secondary | ICD-10-CM | POA: Diagnosis present

## 2022-07-26 DIAGNOSIS — R54 Age-related physical debility: Secondary | ICD-10-CM | POA: Diagnosis present

## 2022-07-26 DIAGNOSIS — J9601 Acute respiratory failure with hypoxia: Secondary | ICD-10-CM | POA: Diagnosis present

## 2022-07-26 DIAGNOSIS — F419 Anxiety disorder, unspecified: Secondary | ICD-10-CM | POA: Diagnosis present

## 2022-07-26 DIAGNOSIS — D84821 Immunodeficiency due to drugs: Secondary | ICD-10-CM | POA: Diagnosis present

## 2022-07-26 DIAGNOSIS — Z803 Family history of malignant neoplasm of breast: Secondary | ICD-10-CM

## 2022-07-26 DIAGNOSIS — I5043 Acute on chronic combined systolic (congestive) and diastolic (congestive) heart failure: Secondary | ICD-10-CM | POA: Diagnosis present

## 2022-07-26 DIAGNOSIS — E1140 Type 2 diabetes mellitus with diabetic neuropathy, unspecified: Secondary | ICD-10-CM | POA: Diagnosis present

## 2022-07-26 DIAGNOSIS — Z885 Allergy status to narcotic agent status: Secondary | ICD-10-CM

## 2022-07-26 DIAGNOSIS — Z79624 Long term (current) use of inhibitors of nucleotide synthesis: Secondary | ICD-10-CM

## 2022-07-26 DIAGNOSIS — N183 Chronic kidney disease, stage 3 unspecified: Secondary | ICD-10-CM | POA: Diagnosis present

## 2022-07-26 DIAGNOSIS — Z96653 Presence of artificial knee joint, bilateral: Secondary | ICD-10-CM | POA: Diagnosis present

## 2022-07-26 DIAGNOSIS — L89152 Pressure ulcer of sacral region, stage 2: Secondary | ICD-10-CM | POA: Diagnosis present

## 2022-07-26 DIAGNOSIS — J9622 Acute and chronic respiratory failure with hypercapnia: Secondary | ICD-10-CM | POA: Diagnosis present

## 2022-07-26 DIAGNOSIS — E785 Hyperlipidemia, unspecified: Secondary | ICD-10-CM | POA: Diagnosis present

## 2022-07-26 DIAGNOSIS — Z681 Body mass index (BMI) 19 or less, adult: Secondary | ICD-10-CM

## 2022-07-26 DIAGNOSIS — J84112 Idiopathic pulmonary fibrosis: Secondary | ICD-10-CM | POA: Diagnosis present

## 2022-07-26 DIAGNOSIS — Z7984 Long term (current) use of oral hypoglycemic drugs: Secondary | ICD-10-CM

## 2022-07-26 DIAGNOSIS — J479 Bronchiectasis, uncomplicated: Secondary | ICD-10-CM | POA: Diagnosis present

## 2022-07-26 DIAGNOSIS — I482 Chronic atrial fibrillation, unspecified: Secondary | ICD-10-CM | POA: Diagnosis present

## 2022-07-26 DIAGNOSIS — J849 Interstitial pulmonary disease, unspecified: Secondary | ICD-10-CM | POA: Diagnosis present

## 2022-07-26 DIAGNOSIS — L8990 Pressure ulcer of unspecified site, unspecified stage: Secondary | ICD-10-CM | POA: Diagnosis present

## 2022-07-26 DIAGNOSIS — Z96643 Presence of artificial hip joint, bilateral: Secondary | ICD-10-CM | POA: Diagnosis present

## 2022-07-26 DIAGNOSIS — J45909 Unspecified asthma, uncomplicated: Secondary | ICD-10-CM | POA: Diagnosis present

## 2022-07-26 DIAGNOSIS — Z9981 Dependence on supplemental oxygen: Secondary | ICD-10-CM

## 2022-07-26 DIAGNOSIS — R051 Acute cough: Principal | ICD-10-CM

## 2022-07-26 DIAGNOSIS — K219 Gastro-esophageal reflux disease without esophagitis: Secondary | ICD-10-CM | POA: Diagnosis present

## 2022-07-26 DIAGNOSIS — Z7901 Long term (current) use of anticoagulants: Secondary | ICD-10-CM

## 2022-07-26 DIAGNOSIS — Z7952 Long term (current) use of systemic steroids: Secondary | ICD-10-CM

## 2022-07-26 DIAGNOSIS — Z7982 Long term (current) use of aspirin: Secondary | ICD-10-CM

## 2022-07-26 DIAGNOSIS — G8929 Other chronic pain: Secondary | ICD-10-CM | POA: Diagnosis present

## 2022-07-26 DIAGNOSIS — Z823 Family history of stroke: Secondary | ICD-10-CM

## 2022-07-26 DIAGNOSIS — M109 Gout, unspecified: Secondary | ICD-10-CM | POA: Diagnosis present

## 2022-07-26 DIAGNOSIS — J9621 Acute and chronic respiratory failure with hypoxia: Secondary | ICD-10-CM | POA: Diagnosis not present

## 2022-07-26 DIAGNOSIS — I10 Essential (primary) hypertension: Secondary | ICD-10-CM | POA: Diagnosis present

## 2022-07-26 DIAGNOSIS — Z87891 Personal history of nicotine dependence: Secondary | ICD-10-CM | POA: Diagnosis not present

## 2022-07-26 DIAGNOSIS — R0902 Hypoxemia: Secondary | ICD-10-CM

## 2022-07-26 LAB — URINALYSIS, COMPLETE (UACMP) WITH MICROSCOPIC
Bacteria, UA: NONE SEEN
Bilirubin Urine: NEGATIVE
Glucose, UA: >=500 mg/dL — AB
Hgb urine dipstick: NEGATIVE
Ketones, ur: NEGATIVE mg/dL
Nitrite: NEGATIVE
Protein, ur: NEGATIVE mg/dL
Specific Gravity, Urine: 1.02 (ref 1.005–1.030)
pH: 6 (ref 5.0–8.0)

## 2022-07-26 LAB — BLOOD GAS, VENOUS
Acid-Base Excess: 12 mmol/L — ABNORMAL HIGH (ref 0.0–2.0)
Bicarbonate: 42.3 mmol/L — ABNORMAL HIGH (ref 20.0–28.0)
O2 Saturation: 40 %
Patient temperature: 37
pCO2, Ven: 86 mmHg (ref 44–60)
pH, Ven: 7.3 (ref 7.25–7.43)
pO2, Ven: 33 mmHg (ref 32–45)

## 2022-07-26 LAB — BRAIN NATRIURETIC PEPTIDE: B Natriuretic Peptide: 262 pg/mL — ABNORMAL HIGH (ref 0.0–100.0)

## 2022-07-26 LAB — BASIC METABOLIC PANEL
Anion gap: 9 (ref 5–15)
BUN: 22 mg/dL (ref 8–23)
CO2: 34 mmol/L — ABNORMAL HIGH (ref 22–32)
Calcium: 8.9 mg/dL (ref 8.9–10.3)
Chloride: 95 mmol/L — ABNORMAL LOW (ref 98–111)
Creatinine, Ser: 0.79 mg/dL (ref 0.44–1.00)
GFR, Estimated: >60 mL/min (ref >60)
Glucose, Bld: 138 mg/dL — ABNORMAL HIGH (ref 70–99)
Potassium: 4.3 mmol/L (ref 3.5–5.1)
Sodium: 138 mmol/L (ref 135–145)

## 2022-07-26 LAB — PROCALCITONIN: Procalcitonin: 0.12 ng/mL

## 2022-07-26 LAB — RESP PANEL BY RT-PCR (RSV, FLU A&B, COVID)  RVPGX2
Influenza A by PCR: NEGATIVE
Influenza B by PCR: NEGATIVE
Resp Syncytial Virus by PCR: NEGATIVE
SARS Coronavirus 2 by RT PCR: NEGATIVE

## 2022-07-26 LAB — CBC
HCT: 35.3 % — ABNORMAL LOW (ref 36.0–46.0)
Hemoglobin: 10.3 g/dL — ABNORMAL LOW (ref 12.0–15.0)
MCH: 28.8 pg (ref 26.0–34.0)
MCHC: 29.2 g/dL — ABNORMAL LOW (ref 30.0–36.0)
MCV: 98.6 fL (ref 80.0–100.0)
Platelets: 226 10*3/uL (ref 150–400)
RBC: 3.58 MIL/uL — ABNORMAL LOW (ref 3.87–5.11)
RDW: 14.5 % (ref 11.5–15.5)
WBC: 11.6 10*3/uL — ABNORMAL HIGH (ref 4.0–10.5)
nRBC: 0 % (ref 0.0–0.2)

## 2022-07-26 LAB — LACTIC ACID, PLASMA
Lactic Acid, Venous: 1.1 mmol/L (ref 0.5–1.9)
Lactic Acid, Venous: 1 mmol/L (ref 0.5–1.9)

## 2022-07-26 LAB — TROPONIN I (HIGH SENSITIVITY)
Troponin I (High Sensitivity): 41 ng/L — ABNORMAL HIGH (ref <18)
Troponin I (High Sensitivity): 37 ng/L — ABNORMAL HIGH (ref <18)

## 2022-07-26 MED ORDER — ONDANSETRON HCL 4 MG/2ML IJ SOLN: 4.0000 mg | Freq: Four times a day (QID) | INTRAMUSCULAR | Status: AC | PRN

## 2022-07-26 MED ORDER — ASPIRIN 81 MG PO TBEC
81.0000 mg | DELAYED_RELEASE_TABLET | Freq: Every day | ORAL | Status: DC
  Administered 2022-07-27 – 2022-07-28 (×2): 81 mg via ORAL
  Filled 2022-07-26 (×2): qty 1

## 2022-07-26 MED ORDER — SODIUM CHLORIDE 0.9 % IV SOLN: 1.0000 g | Freq: Once | INTRAVENOUS | Status: DC

## 2022-07-26 MED ORDER — IOHEXOL 350 MG/ML SOLN
75.0000 mL | Freq: Once | INTRAVENOUS | Status: AC | PRN
  Administered 2022-07-26: 75 mL via INTRAVENOUS

## 2022-07-26 MED ORDER — SODIUM CHLORIDE 0.9 % IV SOLN: 500.0000 mg | Freq: Once | INTRAVENOUS | Status: DC

## 2022-07-26 MED ORDER — METHYLPREDNISOLONE SODIUM SUCC 125 MG IJ SOLR
125.0000 mg | Freq: Once | INTRAMUSCULAR | Status: AC
  Administered 2022-07-26: 125 mg via INTRAVENOUS
  Filled 2022-07-26: qty 2

## 2022-07-26 MED ORDER — SENNOSIDES-DOCUSATE SODIUM 8.6-50 MG PO TABS
1.0000 | ORAL_TABLET | Freq: Every evening | ORAL | Status: DC | PRN
  Administered 2022-07-27 – 2022-07-31 (×2): 1 via ORAL
  Filled 2022-07-26 (×2): qty 1

## 2022-07-26 MED ORDER — IPRATROPIUM-ALBUTEROL 0.5-2.5 (3) MG/3ML IN SOLN: 3.0000 mL | Freq: Four times a day (QID) | RESPIRATORY_TRACT | Status: AC | PRN

## 2022-07-26 MED ORDER — ACETAMINOPHEN 325 MG PO TABS
650.0000 mg | ORAL_TABLET | Freq: Four times a day (QID) | ORAL | Status: AC | PRN
  Administered 2022-07-29 – 2022-07-30 (×2): 650 mg via ORAL
  Filled 2022-07-26 (×2): qty 2

## 2022-07-26 MED ORDER — ENOXAPARIN SODIUM 40 MG/0.4ML IJ SOSY
40.0000 mg | PREFILLED_SYRINGE | INTRAMUSCULAR | Status: DC
  Administered 2022-07-27: 40 mg via SUBCUTANEOUS
  Filled 2022-07-26: qty 0.4

## 2022-07-26 MED ORDER — SODIUM CHLORIDE 0.9 % IV SOLN
2.0000 g | INTRAVENOUS | Status: DC
  Administered 2022-07-26: 2 g via INTRAVENOUS
  Filled 2022-07-26: qty 20

## 2022-07-26 MED ORDER — ONDANSETRON HCL 4 MG PO TABS: 4.0000 mg | ORAL_TABLET | Freq: Four times a day (QID) | ORAL | Status: AC | PRN

## 2022-07-26 MED ORDER — GABAPENTIN 100 MG PO CAPS
100.0000 mg | ORAL_CAPSULE | Freq: Three times a day (TID) | ORAL | Status: DC
  Administered 2022-07-27 – 2022-08-01 (×16): 100 mg via ORAL
  Filled 2022-07-26 (×16): qty 1

## 2022-07-26 MED ORDER — LORAZEPAM 2 MG/ML IJ SOLN: 0.5000 mg | Freq: Four times a day (QID) | INTRAMUSCULAR | Status: DC | PRN

## 2022-07-26 MED ORDER — SODIUM CHLORIDE 0.9 % IV BOLUS (SEPSIS)
1000.0000 mL | Freq: Once | INTRAVENOUS | Status: AC
  Administered 2022-07-26: 1000 mL via INTRAVENOUS

## 2022-07-26 MED ORDER — SODIUM CHLORIDE 0.9 % IV SOLN: 1.0000 g | INTRAVENOUS | Status: DC

## 2022-07-26 MED ORDER — INSULIN ASPART 100 UNIT/ML IJ SOLN: 0.0000 [IU] | Freq: Every day | INTRAMUSCULAR | Status: DC

## 2022-07-26 MED ORDER — IPRATROPIUM-ALBUTEROL 0.5-2.5 (3) MG/3ML IN SOLN
6.0000 mL | Freq: Once | RESPIRATORY_TRACT | Status: AC
  Administered 2022-07-26: 6 mL via RESPIRATORY_TRACT
  Filled 2022-07-26: qty 3

## 2022-07-26 MED ORDER — MYCOPHENOLATE MOFETIL 250 MG PO CAPS
250.0000 mg | ORAL_CAPSULE | Freq: Two times a day (BID) | ORAL | Status: DC
  Administered 2022-07-27 – 2022-08-01 (×12): 250 mg via ORAL
  Filled 2022-07-26 (×13): qty 1

## 2022-07-26 MED ORDER — ACETAMINOPHEN 650 MG RE SUPP: 650.0000 mg | Freq: Four times a day (QID) | RECTAL | Status: AC | PRN

## 2022-07-26 MED ORDER — INSULIN ASPART 100 UNIT/ML IJ SOLN
0.0000 [IU] | Freq: Three times a day (TID) | INTRAMUSCULAR | Status: DC
  Administered 2022-07-27: 2 [IU] via SUBCUTANEOUS
  Administered 2022-07-28: 3 [IU] via SUBCUTANEOUS
  Administered 2022-07-28: 1 [IU] via SUBCUTANEOUS
  Administered 2022-07-29: 7 [IU] via SUBCUTANEOUS
  Administered 2022-07-30: 1 [IU] via SUBCUTANEOUS
  Administered 2022-07-30 – 2022-07-31 (×2): 3 [IU] via SUBCUTANEOUS
  Administered 2022-07-31: 2 [IU] via SUBCUTANEOUS
  Filled 2022-07-26 (×8): qty 1

## 2022-07-26 MED ORDER — CHLORHEXIDINE GLUCONATE CLOTH 2 % EX PADS
6.0000 | MEDICATED_PAD | Freq: Every day | CUTANEOUS | Status: DC
  Administered 2022-07-28: 6 via TOPICAL

## 2022-07-26 MED ORDER — SODIUM CHLORIDE 0.9 % IV SOLN
500.0000 mg | INTRAVENOUS | Status: DC
  Administered 2022-07-26: 500 mg via INTRAVENOUS
  Filled 2022-07-26: qty 5

## 2022-07-26 MED ORDER — HYDRALAZINE HCL 20 MG/ML IJ SOLN: 5.0000 mg | Freq: Three times a day (TID) | INTRAMUSCULAR | Status: DC | PRN

## 2022-07-26 MED ORDER — DIAZEPAM 5 MG/ML IJ SOLN
2.5000 mg | Freq: Four times a day (QID) | INTRAMUSCULAR | Status: DC | PRN
  Administered 2022-07-27 (×2): 2.5 mg via INTRAVENOUS
  Filled 2022-07-26 (×2): qty 2

## 2022-07-26 MED ORDER — SODIUM CHLORIDE 0.9 % IV SOLN
500.0000 mg | INTRAVENOUS | Status: DC
  Filled 2022-07-26: qty 5

## 2022-07-26 NOTE — ED Triage Notes (Signed)
Pt states coming in with shortness of breath. Pt states chronic O2 use between 2.5-5 depending on the activity. Pt states she gets worse shortness of breath with activity.  Pt states pain to the buttocks with pressure sores

## 2022-07-26 NOTE — ED Notes (Signed)
Messaged Cox, MD r/t patient's request for her primary pulmonologist to be called.

## 2022-07-26 NOTE — Assessment & Plan Note (Signed)
Daughter reports the patient has reacted well with Valium in the past and that Ativan is too strong for her Diazepam 2.5 mg IV every 6 hours as needed for anxiety, 3 doses ordered.  This diazepam can be increased to 5 mg if 2.5 mg is not sufficient for patient

## 2022-07-26 NOTE — ED Provider Notes (Signed)
[facility] Provider Note    Event Date/Time   First MD Initiated Contact with Patient 07/26/22 1527     (approximate)   History   Shortness of Breath   HPI  Sierra Guzman is a 82 y.o. female   Past medical history of interstitial lung disease, on CellCept, combined CHF, who presents to the emergency department with a very mild cough, increased oxygen requirement from her new care facility reportedly requiring 8 L nasal cannula from baseline of 2.5 at rest who is here with her daughter.  Patient states that she feels well now and has no acute medical complaints.  Daughter confirms that she requires 2.5 L at baseline has been compliant with her chronic Bactrim.  Patient denies any acute medical complaints but states that she has had a mild cough that is mostly resolved.  No Orthopnea or worsening exertional dyspnea, peripheral edema.  No chest pain.  Independent Historian contributed to assessment above: Daughter who is at bedside.  External Medical Documents Reviewed: Pulmonology note from March 2024 documenting her baseline 2.5 L nasal cannula oxygen requirement.      Physical Exam   Triage Vital Signs: ED Triage Vitals  Enc Vitals Group     BP 07/26/22 1310 115/60     Pulse Rate 07/26/22 1310 92     Resp 07/26/22 1310 15     Temp 07/26/22 1310 98.8 F (37.1 C)     Temp Source 07/26/22 1310 Oral     SpO2 07/26/22 1310 100 %     Weight --      Height --      Head Circumference --      Peak Flow --      Pain Score 07/26/22 1324 8     Pain Loc --      Pain Edu? --      Excl. in GC? --     Most recent vital signs: Vitals:   07/26/22 2028 07/26/22 2209  BP: 111/64 115/60  Pulse: 95 86  Resp: (!) 27 (!) 28  Temp: 98.6 F (37 C)   SpO2: 99% 94%    General: Awake, no distress.  CV:  Good peripheral perfusion.  Resp:  Normal effort.  Abd:  No distention.  Other:  Resting comfortably with normal hemodynamics, tachypneic, oxygen  saturation down to 82% on her home 2.5 L at rest.  Her lungs have rales at the bases bilaterally but she has no peripheral edema.  Infection appears slightly dehydrated with dry mucous membranes.   ED Results / Procedures / Treatments   Labs (all labs ordered are listed, but only abnormal results are displayed) Labs Reviewed  BASIC METABOLIC PANEL - Abnormal; Notable for the following components:      Result Value   Chloride 95 (*)    CO2 34 (*)    Glucose, Bld 138 (*)    All other components within normal limits  CBC - Abnormal; Notable for the following components:   WBC 11.6 (*)    RBC 3.58 (*)    Hemoglobin 10.3 (*)    HCT 35.3 (*)    MCHC 29.2 (*)    All other components within normal limits  BRAIN NATRIURETIC PEPTIDE - Abnormal; Notable for the following components:   B Natriuretic Peptide 262.0 (*)    All other components within normal limits  BLOOD GAS, VENOUS - Abnormal; Notable for the following components:   pCO2, Ven 86 (*)  Bicarbonate 42.3 (*)    Acid-Base Excess 12.0 (*)    All other components within normal limits  URINALYSIS, COMPLETE (UACMP) WITH MICROSCOPIC - Abnormal; Notable for the following components:   Color, Urine YELLOW (*)    APPearance CLEAR (*)    Glucose, UA >=500 (*)    Leukocytes,Ua TRACE (*)    All other components within normal limits  TROPONIN I (HIGH SENSITIVITY) - Abnormal; Notable for the following components:   Troponin I (High Sensitivity) 41 (*)    All other components within normal limits  TROPONIN I (HIGH SENSITIVITY) - Abnormal; Notable for the following components:   Troponin I (High Sensitivity) 37 (*)    All other components within normal limits  RESP PANEL BY RT-PCR (RSV, FLU A&B, COVID)  [id number]  CULTURE, BLOOD (ROUTINE X 2)  CULTURE, BLOOD (ROUTINE X 2)  MRSA NEXT GEN BY PCR, NASAL  PROCALCITONIN  LACTIC ACID, PLASMA  LACTIC ACID, PLASMA  PROTIME-INR  BASIC METABOLIC PANEL  CBC  HEMOGLOBIN A1C     I ordered and  reviewed the above labs they are notable for she has mildly elevated white blood cell count   EKG  ED ECG REPORT I, Pilar Jarvis, the attending physician, personally viewed and interpreted this ECG.   Date: 07/26/2022  EKG Time: 1332  Rate: 94  Rhythm: nsr  Axis: nl  Intervals:none  ST&T Change: No acute ischemic changes    RADIOLOGY I independently reviewed and interpreted chest x-ray see no focal consolidations   PROCEDURES:  Critical Care performed: Yes, see critical care procedure note(s)  .Critical Care  Performed by: Pilar Jarvis, MD Authorized by: Pilar Jarvis, MD   Critical care provider statement:    Critical care time (minutes):  30   Critical care was time spent personally by me on the following activities:  Development of treatment plan with patient or surrogate, discussions with consultants, evaluation of patient's response to treatment, examination of patient, ordering and review of laboratory studies, ordering and review of radiographic studies, ordering and performing treatments and interventions, pulse oximetry, re-evaluation of patient's condition and review of old charts    MEDICATIONS ORDERED IN ED: Medications  acetaminophen (TYLENOL) tablet 650 mg (has no administration in time range)    Or  acetaminophen (TYLENOL) suppository 650 mg (has no administration in time range)  ondansetron (ZOFRAN) tablet 4 mg (has no administration in time range)    Or  ondansetron (ZOFRAN) injection 4 mg (has no administration in time range)  senna-docusate (Senokot-S) tablet 1 tablet (has no administration in time range)  enoxaparin (LOVENOX) injection 40 mg (has no administration in time range)  ipratropium-albuterol (DUONEB) 0.5-2.5 (3) MG/3ML nebulizer solution 3 mL (has no administration in time range)  diazepam (VALIUM) injection 2.5 mg (has no administration in time range)  mycophenolate (CELLCEPT) capsule 250 mg (has no administration in time range)  gabapentin  (NEURONTIN) capsule 100 mg (has no administration in time range)  aspirin EC tablet 81 mg (has no administration in time range)  insulin aspart (novoLOG) injection 0-5 Units (has no administration in time range)  insulin aspart (novoLOG) injection 0-9 Units (has no administration in time range)  hydrALAZINE (APRESOLINE) injection 5 mg (has no administration in time range)  cefTRIAXone (ROCEPHIN) 1 g in sodium chloride 0.9 % 100 mL IVPB (has no administration in time range)  azithromycin (ZITHROMAX) 500 mg in sodium chloride 0.9 % 250 mL IVPB (0 mg Intravenous Stopped 07/26/22 2211)  Chlorhexidine Gluconate Cloth 2 %  PADS 6 each (has no administration in time range)  ipratropium-albuterol (DUONEB) 0.5-2.5 (3) MG/3ML nebulizer solution 6 mL (6 mLs Nebulization Given 07/26/22 1814)  methylPREDNISolone sodium succinate (SOLU-MEDROL) 125 mg/2 mL injection 125 mg (125 mg Intravenous Given 07/26/22 1814)  sodium chloride 0.9 % bolus 1,000 mL (0 mLs Intravenous Stopped 07/26/22 2031)  iohexol (OMNIPAQUE) 350 MG/ML injection 75 mL (75 mLs Intravenous Contrast Given 07/26/22 2007)    External physician / consultants:  I spoke with hospitalist for admission and regarding care plan for this patient.   IMPRESSION / MDM / ASSESSMENT AND PLAN / ED COURSE  I reviewed the triage vital signs and the nursing notes.                                Patient's presentation is most consistent with acute presentation with potential threat to life or bodily function.  Differential diagnosis includes, but is not limited to, acute hypoxemic respiratory failure, viral URI, bacterial pneumonia, CHF exacerbation, PE, ACS   The patient is on the cardiac monitor to evaluate for evidence of arrhythmia and/or significant heart rate changes.  MDM:   This is a patient who is on CellCept immunocompromised with ILD and increasing oxygen requirement in the setting of a mild cough.  She has mild elevation in white blood cell count  and although she has no focal consolidation on her chest x-ray I will cover her for community-acquired pneumonia, and treat her with nebulizer albuterol/ipratropium as well as steroids.  I considered but doubt ACS given no chest pain, nonischemic EKG, and flat troponins.  I considered but I think less likely PE given no chest pain, no complaints of dyspnea, normal vital signs other than hypoxemia.  Admission.         FINAL CLINICAL IMPRESSION(S) / ED DIAGNOSES   Final diagnoses:  Acute cough  Hypoxia  ILD (interstitial lung disease)     Rx / DC Orders   ED Discharge Orders     None        Note:  This document was prepared using Dragon voice recognition software and may include unintentional dictation errors.    Pilar Jarvis, MD 07/26/22 2251

## 2022-07-26 NOTE — ED Notes (Signed)
Pt to be transported on bipap to room ICU8 by float nurse and RT. Stable at time of departure.

## 2022-07-26 NOTE — ED Triage Notes (Signed)
First Nurse Note:  Pt via EMS from [facility]. Pt ahs a hx of chronic resp failure with hypoxia. Pt chronically on 5L [place], on exertion pt placed on 8L [place]. NP at [facility] that she is unable to stay at [facility] on 8L [place]. Pt is A&Ox4 and NAD 113/58 BP 96% on 4L [place] per EMS Pt has a hx of A fib, and a fib on the monitor

## 2022-07-26 NOTE — Assessment & Plan Note (Signed)
-   Aspirin 81 mg daily resumed 

## 2022-07-26 NOTE — Assessment & Plan Note (Signed)
Present on admission

## 2022-07-26 NOTE — Assessment & Plan Note (Signed)
-   Hydralazine 5 mg IV every 8 hours as needed for SBP greater 175, 4 days ordered 

## 2022-07-26 NOTE — H&P (Addendum)
History and Physical   Sierra Guzman:[id number] DOB: 10-29-1940 DOA: 07/26/2022  PCP: Ethelda Chick, MD  Specialist provider: Dr. Karna Christmas, pulmonology Patient coming from: [facility] via EMS  I have personally briefly reviewed patient's old medical records in [facility] EMR.  Chief Concern: Increased O2 requirement  HPI: [name] is a 82 year old female with history of hypertension, non-insulin-dependent diabetes mellitus, hyperlipidemia, neuropathy, depression, anxiety, interstitial lung disease on mycophenolate, GERD, who presents emergency department for chief concerns of increased oxygen requirement from [employer].  Vitals showed temperature of 98.8, respiration rate of 15, heart rate 92, blood pressure 115/60, SpO2 of 100% on 6 L nasal cannula.  Serum sodium is 138, potassium 4.3, chloride 95, bicarb 34, BUN of 22, serum creatinine 0.79, nonfasting blood glucose 138, WBC 11.6, hemoglobin 10.3, platelets of 226.  ED treatment: DuoNebs one-time dose, Solu-Medrol 125 mg IV one-time dose, azithromycin and ceftriaxone community-acquired pneumonia coverage were ordered ----------------------------- At bedside, she is able to tell me her name, age, current location, current calendar year. She denies chest pain, abdominal pain,nausea, vomiting.   She endorses dysuria. She denies hematuria. She endorses subjective fever.  Per daughter, [name] called Sierra Guzman, stating that patient desaturated to 83% on 8 L [place] during physical therapy.   Social history: She lives with [place].She denies current tiobacco use, quitting 30 years ago. She denies etoh and recreational drug use. She is retired and formerly did hair.   ROS: Constitutional: no weight change, no fever ENT/Mouth: no sore throat, no rhinorrhea Eyes: no eye pain, no vision changes Cardiovascular: no chest pain, + dyspnea,  no edema, no palpitations Respiratory: + cough, + sputum (white), no  wheezing Gastrointestinal: no nausea, no vomiting, no diarrhea, no constipation Genitourinary: no urinary incontinence, + dysuria, no hematuria Musculoskeletal: no arthralgias, no myalgias Skin: no skin lesions, no pruritus, Neuro: + weakness, no loss of consciousness, no syncope Psych: no anxiety, no depression, no decrease appetite Heme/Lymph: no bruising, no bleeding  ED Course: Discussed with emergency medicine provider, patient requiring hospitalization for chief concerns of new increased O2 requirements.  Assessment/Plan  Principal Problem:   Acute on chronic respiratory failure with hypoxia Active Problems:   SIRS (systemic inflammatory response syndrome)   Interstitial lung disease   Hypertension   Pressure injury of skin   Coronary artery disease   DDD (degenerative disc disease), cervical   Acute hypoxemic respiratory failure   Dysuria   Anxiety   Assessment and Plan:  * Acute on chronic respiratory failure with hypoxia With hypercapnia Continue oxygen supplementation to maintain SpO2 greater than 92% Patient endorses shortness of breath and telling me that she needs to take a break from talking. At bedside her pulse oximetry was registering 60-70s while she was eating and/or talking to me.   BiPAP continuous ordered PCU order changed to stepdown  SIRS (systemic inflammatory response syndrome) Leukocytosis Patient has increased heart rate, new leukocytosis, with possible organ involvement of pulmonary Check lactic acid x 2, procalcitonin, blood cultures x 2 Azithromycin and ceftriaxone for sepsis dosing was initially ordered however family did not want antibiotic coverage for pneumonia based on the chest x-ray and is requesting patient's pulmonology to be consulted, Dr. Karna Christmas to evaluate patient before antibiotic initiated.  I discussed with family that we will order a CTA at this time and if the CTA reads positive for pneumonia we will initiate antibiotic as Dr.  Karna Christmas will be available tomorrow Maintain O2 supplementation greater than 92% and  MAP greater than 65 CTA PE ordered pending completion, discussed with cross coverage provider if positive for pneumonia to please initiate ceftriaxone azithromycin Admit to inpatient, PCU  Interstitial lung disease Mycophenolate 250 mg daily twice daily resumed  Hypertension Hydralazine 5 mg IV every 8 hours as needed for SBP greater 175, 4 days ordered  Pressure injury of skin Present on admission  Coronary artery disease Aspirin 81 mg daily resumed  Anxiety Daughter reports the patient has reacted well with Valium in the past and that Ativan is too strong for her Diazepam 2.5 mg IV every 6 hours as needed for anxiety, 3 doses ordered.  This diazepam can be increased to 5 mg if 2.5 mg is not sufficient for patient  Dysuria Patient endorses dysuria that started over the last 2 days UA ordered, if positive for leukocytes and/or nitrites, discussed with cross coverage provider to initiate ceftriaxone IV Addendum: UA was positive for leukocytes, trace.  Will initiate ceftriaxone 1 g IV daily, 5 doses ordered.  Discussed with family and family is in agreement  DDD (degenerative disc disease), cervical Aspirin 81 mg daily resumed  Chart reviewed.   DVT prophylaxis: Enoxaparin Code Status: DNR/DNI, per discussion with family and confirmed with daughter Sierra Guzman Diet: Heart healthy Family Communication: Discussed with Sierra Guzman, patient's healthcare power of attorney and daughter Disposition Plan: Pending clinical course Consults called: None at this time, family requesting Dr. Karna Christmas consultation.  AM team to reach out to pulmonologist on 07/27/2022 Admission status: Inpatient, PCU  Past Medical History:  Diagnosis Date   Anemia 10/28/2010   unspecified   Anxiety    Aortic valve sclerosis    Arthritis    Asthma    Chronic combined systolic and diastolic CHF (congestive heart failure)    echo-  10/22 LVEF 45-55%, G1DD, moderately reduced RV fxn, mild aortic regurg/stenosis   Chronic kidney disease    Chronic Kidney Disease---Stage III   Coronary artery disease 10/28/2010   DDD (degenerative disc disease), lumbar 2006   Diabetes type 2, controlled 10/28/2010   Dyspnea    GERD (gastroesophageal reflux disease)    Gout 10/28/2010   Heart murmur    History of cardiac catheterization 2006   LAD 50%, D1 50% rest normal.    History of cardiovascular stress test 09/23/2009    lexiscan Normal Cardiolite test, EF 61%. LV size and function - normal   History of echocardiogram 05/27/2008   EF 55%; Mild aortic insufficiency with minimal aortic sclerosis. L vent normal.    Hyperlipidemia    Hyperlipidemia LDL goal <100 10/28/2010   managed by Dr. Burr Medico   Hypertension 10/28/2010   IPF (idiopathic pulmonary fibrosis)    Progressive, with UIP   Mild vitamin D deficiency 02/22/2011   Obesity (BMI 30-39.9) 10/28/2010   unspecified   Osteoarthritis 10/28/2010   bilateral knees, left hip   Personal history of colonic polyps 10/28/2010   Shortness of breath    Syncope 2013   Vitamin B 12 deficiency 10/28/2010   Past Surgical History:  Procedure Laterality Date   BACK SURGERY  2006   DDD   BREAST BIOPSY Right    neg   CARDIAC CATHETERIZATION  2006   EYE SURGERY Bilateral    Cataract Extraction with IOL   JOINT REPLACEMENT Left    Left Total Hip Replacement   JOINT REPLACEMENT     RIGHT 2005; LEFT 2 X 2004, 2011   REPLACEMENT TOTAL KNEE Left    REVISION TOTAL KNEE ARTHROPLASTY Left  Dr. Ernest Pine   TONSILLECTOMY     TOTAL HIP ARTHROPLASTY Right 12/12/2016   Procedure: TOTAL HIP ARTHROPLASTY ANTERIOR APPROACH;  Surgeon: Kennedy Bucker, MD;  Location: [facility] ORS;  Service: Orthopedics;  Laterality: Right;   TOTAL HIP ARTHROPLASTY Left 12/26/2013   Procedure: ARTHROPLASTY HIP TOTAL; Surgeon: Christell Faith, MD; [facility]; Service: Orthopedics; Laterality: left,    TOTAL  KNEE ARTHROPLASTY     both knees   Social History:  reports that she quit smoking about 31 years ago. Her smoking use included cigarettes. She has a 2.50 pack-year smoking history. She has never used smokeless tobacco. She reports that she does not drink alcohol and does not use drugs.  Allergies  Allergen Reactions   Morphine And Related Nausea And Vomiting   Family History  Problem Relation Age of Onset   Breast cancer Mother 10   Diabetes Mother    Stroke Mother    Stroke Brother    Breast cancer Cousin        pat cousin   Family history: Family history reviewed and not pertinent.  Prior to Admission medications   Medication Sig Start Date End Date Taking? Authorizing Provider  acetaminophen (TYLENOL) 325 MG tablet Take 2 tablets (650 mg total) by mouth every 6 (six) hours as needed (pain). 01/17/21   Arrien, York Ram, MD  albuterol (PROVENTIL) (2.5 MG/3ML) 0.083% nebulizer solution Take 3 mLs (2.5 mg total) by nebulization every 2 (two) hours as needed for wheezing or shortness of breath. 03/10/21   Swayze, Ava, DO  ALPRAZolam (XANAX) 0.25 MG tablet Take 1 tablet (0.25 mg total) by mouth 3 (three) times daily as needed for anxiety. 06/17/21   Hollice Espy, MD  aspirin EC 81 MG EC tablet Take 1 tablet (81 mg total) by mouth daily. Swallow whole. 01/18/21   Arrien, York Ram, MD  budesonide (PULMICORT) 0.25 MG/2ML nebulizer solution Take 2 mLs (0.25 mg total) by nebulization 2 (two) times daily. 03/10/21   Swayze, Ava, DO  cholecalciferol (VITAMIN D) 1000 units tablet Take 1,000 Units by mouth daily.    [provider]  digoxin (LANOXIN) 0.125 MG tablet Take 1 tablet (0.125 mg total) by mouth daily. 06/18/21   Hollice Espy, MD  DOCOSAHEXAENOIC ACID-EPA PO Take by mouth.    [provider]  Ensure Max Protein (ENSURE MAX PROTEIN) LIQD Take 330 mLs (11 oz total) by mouth at bedtime. 06/17/21   Hollice Espy, MD  feeding supplement, GLUCERNA  SHAKE, (GLUCERNA SHAKE) LIQD Take 237 mLs by mouth 2 (two) times daily between meals. 06/18/21   Hollice Espy, MD  furosemide (LASIX) 20 MG tablet Take 1 tablet (20 mg total) by mouth daily. 06/18/21   Hollice Espy, MD  gabapentin (NEURONTIN) 100 MG capsule Take 100 mg by mouth 3 (three) times daily. 06/25/21   [provider]  guaifenesin (ROBITUSSIN) 100 MG/5ML syrup Take 200 mg by mouth 4 (four) times daily as needed for cough.    [provider]  JARDIANCE 25 MG TABS tablet Take 25 mg by mouth daily. 01/16/22   [provider]  lidocaine (LIDODERM) 5 % Place 1 patch onto the skin daily. Remove & Discard patch within 12 hours or as directed by MD 06/18/21   Hollice Espy, MD  loratadine (CLARITIN) 10 MG tablet Take 10 mg by mouth daily.    [provider]  magnesium oxide (MAG-OX) 400 MG tablet Take 1 tablet by mouth daily. 07/21/20  [provider]  metFORMIN (GLUCOPHAGE) 850 MG tablet Take 850 mg by mouth 2 (two) times daily. Patient not taking: Reported on 01/20/2022 08/09/21   [provider]  metoprolol succinate (TOPROL-XL) 25 MG 24 hr tablet Take 12.5 mg by mouth daily.    [provider]  mirtazapine (REMERON) 15 MG tablet Take 15 mg by mouth at bedtime. 06/02/21   [provider]  mycophenolate (CELLCEPT) 250 MG capsule Take 1 capsule (250 mg total) by mouth 2 (two) times daily. 06/17/21   Hollice Espy, MD  NAC 600 MG CAPS Take 600 mg by mouth 2 (two) times daily. 05/17/21   [provider]  Omega-3 Fatty Acids (FISH OIL) 1000 MG CAPS Take 1,000 mg by mouth daily.    [provider]  omeprazole (PRILOSEC) 20 MG capsule Take 20 mg by mouth daily. 11/05/20   [provider]  Pirfenidone 267 MG TABS Take 534 mg by mouth 3 (three) times daily.    [provider]  polyethylene glycol (MIRALAX / GLYCOLAX) 17 g packet Take 17 g by mouth daily. 12/19/20   Burnadette Pop, MD   predniSONE (DELTASONE) 10 MG tablet Take 10 mg by mouth daily. Patient not taking: Reported on 01/20/2022 08/29/21   [provider]  predniSONE (DELTASONE) 50 MG tablet Take 1 tablet daily for next 5 days and then resume your home dose 01/21/22   Arnetha Courser, MD  rosuvastatin (CRESTOR) 40 MG tablet Take 40 mg by mouth daily. Patient not taking: Reported on 09/14/2021 09/17/20   [provider]  sertraline (ZOLOFT) 25 MG tablet Take 25 mg by mouth daily. 06/03/21 06/03/22  [provider]  sodium chloride (OCEAN) 0.65 % SOLN nasal spray Place 2 sprays into both nostrils as needed for congestion. 03/10/21   Swayze, Ava, DO  sulfamethoxazole-trimethoprim (BACTRIM) 400-80 MG tablet Take 1 tablet by mouth 3 (three) times a week. 08/29/21   [provider]  enoxaparin (LOVENOX) 40 MG/0.4ML injection Inject 0.4 mLs (40 mg total) into the skin daily. 12/15/16 03/25/19  Evon Slack, PA-C  pregabalin (LYRICA) 50 MG capsule Take 1 capsule (50 mg total) by mouth 3 (three) times daily. 12/18/16 03/25/19  Luciana Axe, NP  ranitidine (ZANTAC) 300 MG capsule Take 300 mg by mouth every evening.  03/25/19  [provider]  simvastatin (ZOCOR) 40 MG tablet Take 40 mg by mouth daily at 6 PM.  03/25/19  [provider]   Physical Exam: Vitals:   07/26/22 1540 07/26/22 1600 07/26/22 1820 07/26/22 1855  BP: 106/62 104/70 (!) 133/98 138/70  Pulse: 93 83 83 94  Resp: 18 20 19  (!) 27  Temp:      TempSrc:      SpO2: 100% 95% 94% 91%   Constitutional: appears age up, frail, cachectic appearing, uncomfortable, tripod position Eyes: PERRL, lids and conjunctivae normal ENMT: Mucous membranes are moist. Posterior pharynx clear of any exudate or lesions. Age-appropriate dentition. Hearing appropriate Neck: normal, supple, no masses, no thyromegaly Respiratory: Generalized decreased lung sounds bilaterally, no wheezing, no crackles. Increased respiratory effort.  Increased accessory muscle use. Dyspneic. Difficulty breathing Cardiovascular: Regular rate and rhythm, no murmurs / rubs / gallops. No extremity edema. 2+ pedal pulses. No carotid bruits.  Abdomen: no tenderness, no masses palpated, no hepatosplenomegaly. Bowel sounds positive.  Musculoskeletal: no clubbing / cyanosis. No joint deformity upper and lower extremities. Good ROM, no contractures, no atrophy. Normal muscle tone.  Skin: no rashes, lesions, ulcers. No  induration Neurologic: Sensation intact. Strength 5/5 in all 4. Stops to rest between sentences due to shortness of breath.  Psychiatric: Normal judgment and insight. Alert and oriented x 3. Normal mood.   EKG: independently reviewed, showing sinus rhythm with rate of 94, QTc 392  Chest x-ray on Admission: I personally reviewed and I agree with radiologist reading as below.  DG Chest 2 View  Result Date: 07/26/2022 CLINICAL DATA:  SOB EXAM: CHEST - 2 VIEW COMPARISON:  01/19/2022 FINDINGS: Enlarged cardiac silhouette. Extensive pulmonary interstitial fibrosis. No pneumothorax or pleural effusion identified. No focal alveolar consolidation. Osseous structures appear grossly intact. IMPRESSION: Enlarged cardiac silhouette.  Extensive pulmonary fibrosis. Electronically Signed   By: Layla Maw M.D.   On: 07/26/2022 14:26    Labs on Admission: I have personally reviewed following labs  CBC: Recent Labs  Lab 07/26/22 1341  WBC 11.6*  HGB 10.3*  HCT 35.3*  MCV 98.6  PLT 226   Basic Metabolic Panel: Recent Labs  Lab 07/26/22 1341  NA 138  K 4.3  CL 95*  CO2 34*  GLUCOSE 138*  BUN 22  CREATININE 0.79  CALCIUM 8.9   GFR: CrCl cannot be calculated (Unknown ideal weight.).  Urine analysis:    Component Value Date/Time   COLORURINE YELLOW (A) 07/26/2022 1745   APPEARANCEUR CLEAR (A) 07/26/2022 1745   LABSPEC 1.020 07/26/2022 1745   PHURINE 6.0 07/26/2022 1745   GLUCOSEU >=500 (A) 07/26/2022 1745   HGBUR NEGATIVE  07/26/2022 1745   BILIRUBINUR NEGATIVE 07/26/2022 1745   KETONESUR NEGATIVE 07/26/2022 1745   PROTEINUR NEGATIVE 07/26/2022 1745   NITRITE NEGATIVE 07/26/2022 1745   LEUKOCYTESUR TRACE (A) 07/26/2022 1745   CRITICAL CARE Performed by: Dr. Sedalia Muta  Total critical care time: 35 minutes  Critical care time was exclusive of separately billable procedures and treating other patients.  Critical care was necessary to treat or prevent imminent or life-threatening deterioration.  Critical care was time spent personally by me on the following activities: development of treatment plan with patient and/or surrogate as well as nursing, discussions with consultants, evaluation of patient's response to treatment, examination of patient, obtaining history from patient or surrogate, ordering and performing treatments and interventions, ordering and review of laboratory studies, ordering and review of radiographic studies, pulse oximetry and re-evaluation of patient's condition.  This document was prepared using Dragon Voice Recognition software and may include unintentional dictation errors.  Dr. Sedalia Muta [facility]  If 7PM-7AM, please contact overnight-coverage provider If 7AM-7PM, please contact day coverage provider [email or web address]  07/26/2022, 7:49 PM

## 2022-07-26 NOTE — ED Provider Triage Note (Signed)
Emergency Medicine Provider Triage Evaluation Note  LAJOY VANAMBURG , a 82 y.o. female  was evaluated in triage.  Pt complains of shortness of breath. Chronically on O2 at [employer].  Worse with activity.    Review of Systems  Positive: SOB, hx of chronic resp failure with hypoxia Negative: No CP per patient  Physical Exam  BP 115/60 (BP Location: Right Arm)   Pulse 92   Temp 98.8 F (37.1 C) (Oral)   Resp 15   SpO2 100%  Gen:   Awake, no distress  Alert and answering questions Resp:  Normal effort  Lungs clear.   MSK:   Moves extremities without difficulty  Other:    Medical Decision Making  Medically screening exam initiated at 1:29 PM.  Appropriate orders placed.  MONICE LUNDY was informed that the remainder of the evaluation will be completed by another provider, this initial triage assessment does not replace that evaluation, and the importance of remaining in the ED until their evaluation is complete.     Tommi Rumps, PA-C 07/26/22 1334

## 2022-07-26 NOTE — ED Notes (Signed)
Messaged night coverage r/t patient's request for her primary pulmonologist to be called.

## 2022-07-26 NOTE — ED Notes (Signed)
RT at bedside to initiate bipap. This RN accompanied patient to CT.

## 2022-07-26 NOTE — Assessment & Plan Note (Signed)
Mycophenolate 250 mg daily twice daily resumed

## 2022-07-26 NOTE — ED Notes (Signed)
Multiple attempts at blood work by Dentist, unable to get blood. Lab called.

## 2022-07-26 NOTE — Hospital Course (Addendum)
[name] is a 82 year old female with history of hypertension, non-insulin-dependent diabetes mellitus, hyperlipidemia, neuropathy, depression, anxiety, interstitial lung disease on mycophenolate, GERD, who presents emergency department for chief concerns of increased oxygen requirement from [facility]. Note chronic O2 use between 2.5-5 depending on the activity. Known pressure injury buttocks. C/o dysuria, subjective fever.  04/17: RR 15, heart rate 92, 115/60, SpO2 of 100% on 6 L [place]. DuoNebs one-time dose, Solu-Medrol 125 mg IV one-time dose, azithromycin and ceftriaxone for CAP.  CT PE with findings of interstitial and airspace opacification with bronchiectasis bilaterally and pulmonary fibrosis.  04/18: seen by pulmonology - added Lasix, started aldactone and torsemide po, continue steroids and CellCept, d/c antibiotics.  04/19: SpO2 100% 4L [place]. BCx NGx2d. Still needing BiPap qhs. PT/OT recs for SNF, TOC consulted 04/20: remains on 4L. About at baseline per pulmonary, but substantial weakness/desaturation w/ minimal exertion. TOC unable to reach her facility anyway to discuss possible accepting her back today/tomorrow, will keep here likely thru the weekend    Consultants:  Pulmonology   Procedures: none      ASSESSMENT & PLAN:   Principal Problem:   Acute on chronic respiratory failure with hypoxia Active Problems:   SIRS (systemic inflammatory response syndrome)   Interstitial lung disease   Hypertension   Pressure injury of skin   Coronary artery disease   DDD (degenerative disc disease), cervical   Acute hypoxemic respiratory failure   Dysuria   Anxiety  Acute on chronic respiratory failure with hypoxia and hypercapnia Likely d/t ILD and CHF Continue oxygen supplementation to maintain SpO2 greater than 92% BiPAP continuous initially -->  now on [place], w/ BiPap qhs/prn Treat underlying causes as below    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  CHF Last 2D echocardiogram from 03/24 showed an LVEF of 30 to 35% with LV global hypokinesis and grade 1 diastolic dysfunction S/p Lasix  Continue torsemide and spironolactone Optimize blood pressure control Monitor BMP   Interstitial lung disease Mycophenolate and dexamethasone resumed  SIRS (systemic inflammatory response syndrome) Sepsis ruled out  Abx d/c   Chronic atrial fibrillation Continue amiodarone, digoxin and Eliquis as primary prevention for an acute stroke Not on beta-blockers d/t intolerance   Hypertension Hydralazine 5 mg IV every 8 hours as needed for SBP greater 175, 4 days ordered   Pressure injury of skin Present on admission Routine care    Coronary artery disease Aspirin 81 mg daily resumed   Anxiety Daughter reports the patient has done well with Valium in the past and that Ativan is too strong for her Diazepam 2.5 mg IV every 6 hours as needed for anxiety, 3 doses ordered.   diazepam can be increased to 5 mg if 2.5 mg is not sufficient for patient   Dysuria Patient endorses dysuria that started over the last 2 days UA was positive for leukocytes, trace.   NO sepsis, No UCx collected, abx d/c, monitor symptoms    DDD (degenerative disc disease), cervical Pain control as needed     DVT prophylaxis: lovenox  Pertinent IV fluids/nutrition: no continuous IV fluids, heart healthy diet  Central lines / invasive devices: none   Code Status: DNR/DNI, per discussion with family and confirmed with daughter [name] by admitting hospitalist  ACP documentation reviewed: none on file   Current Admission Status: inpatient   TOC needs / Dispo plan: SNF Barriers to discharge / significant pending items: O2 requirement is about baseline at rest but still substantial O2  requirement w/ minimal exertion, expect will be here thru the weekend pending SNF return and improvement in oxygenation

## 2022-07-26 NOTE — Assessment & Plan Note (Addendum)
With hypercapnia Continue oxygen supplementation to maintain SpO2 greater than 92% Patient endorses shortness of breath and telling me that she needs to take a break from talking. At bedside her pulse oximetry was registering 60-70s while she was eating and/or talking to me.   BiPAP continuous ordered PCU order changed to stepdown

## 2022-07-26 NOTE — Assessment & Plan Note (Addendum)
Patient endorses dysuria that started over the last 2 days UA ordered, if positive for leukocytes and/or nitrites, discussed with cross coverage provider to initiate ceftriaxone IV Addendum: UA was positive for leukocytes, trace.  Will initiate ceftriaxone 1 g IV daily, 5 doses ordered.  Discussed with family and family is in agreement

## 2022-07-26 NOTE — Assessment & Plan Note (Addendum)
Leukocytosis Patient has increased heart rate, new leukocytosis, with possible organ involvement of pulmonary Check lactic acid x 2, procalcitonin, blood cultures x 2 Azithromycin and ceftriaxone for sepsis dosing was initially ordered however family did not want antibiotic coverage for pneumonia based on the chest x-ray and is requesting patient's pulmonology to be consulted, Dr. Karna Christmas to evaluate patient before antibiotic initiated.  I discussed with family that we will order a CTA at this time and if the CTA reads positive for pneumonia we will initiate antibiotic as Dr. Karna Christmas will be available tomorrow Maintain O2 supplementation greater than 92% and MAP greater than 65 CTA PE ordered pending completion, discussed with cross coverage provider if positive for pneumonia to please initiate ceftriaxone azithromycin Admit to inpatient, PCU

## 2022-07-27 ENCOUNTER — Other Ambulatory Visit (HOSPITAL_COMMUNITY): Payer: Self-pay

## 2022-07-27 DIAGNOSIS — J9621 Acute and chronic respiratory failure with hypoxia: Secondary | ICD-10-CM | POA: Diagnosis not present

## 2022-07-27 LAB — CBC
HCT: 31.8 % — ABNORMAL LOW (ref 36.0–46.0)
Hemoglobin: 9.3 g/dL — ABNORMAL LOW (ref 12.0–15.0)
MCH: 28.7 pg (ref 26.0–34.0)
MCHC: 29.2 g/dL — ABNORMAL LOW (ref 30.0–36.0)
MCV: 98.1 fL (ref 80.0–100.0)
Platelets: 215 10*3/uL (ref 150–400)
RBC: 3.24 MIL/uL — ABNORMAL LOW (ref 3.87–5.11)
RDW: 14.5 % (ref 11.5–15.5)
WBC: 8.2 10*3/uL (ref 4.0–10.5)
nRBC: 0 % (ref 0.0–0.2)

## 2022-07-27 LAB — BASIC METABOLIC PANEL
Anion gap: 7 (ref 5–15)
BUN: 26 mg/dL — ABNORMAL HIGH (ref 8–23)
CO2: 34 mmol/L — ABNORMAL HIGH (ref 22–32)
Calcium: 8.9 mg/dL (ref 8.9–10.3)
Chloride: 100 mmol/L (ref 98–111)
Creatinine, Ser: 0.96 mg/dL (ref 0.44–1.00)
GFR, Estimated: 59 mL/min — ABNORMAL LOW (ref >60)
Glucose, Bld: 135 mg/dL — ABNORMAL HIGH (ref 70–99)
Potassium: 5 mmol/L (ref 3.5–5.1)
Sodium: 141 mmol/L (ref 135–145)

## 2022-07-27 LAB — HEMOGLOBIN A1C
Hgb A1c MFr Bld: 5.5 % (ref 4.8–5.6)
Mean Plasma Glucose: 111.15 mg/dL

## 2022-07-27 LAB — GLUCOSE, CAPILLARY
Glucose-Capillary: 181 mg/dL — ABNORMAL HIGH (ref 70–99)
Glucose-Capillary: 111 mg/dL — ABNORMAL HIGH (ref 70–99)
Glucose-Capillary: 172 mg/dL — ABNORMAL HIGH (ref 70–99)
Glucose-Capillary: 66 mg/dL — ABNORMAL LOW (ref 70–99)
Glucose-Capillary: 175 mg/dL — ABNORMAL HIGH (ref 70–99)
Glucose-Capillary: 97 mg/dL (ref 70–99)

## 2022-07-27 LAB — PROTIME-INR
INR: 1.2 (ref 0.8–1.2)
Prothrombin Time: 14.9 seconds (ref 11.4–15.2)

## 2022-07-27 LAB — CULTURE, BLOOD (ROUTINE X 2): Special Requests: ADEQUATE

## 2022-07-27 LAB — MRSA NEXT GEN BY PCR, NASAL: MRSA by PCR Next Gen: NOT DETECTED

## 2022-07-27 MED ORDER — APIXABAN 2.5 MG PO TABS
2.5000 mg | ORAL_TABLET | Freq: Two times a day (BID) | ORAL | Status: DC
  Administered 2022-07-27 – 2022-08-01 (×11): 2.5 mg via ORAL
  Filled 2022-07-27 (×11): qty 1

## 2022-07-27 MED ORDER — SPIRONOLACTONE 12.5 MG HALF TABLET
12.5000 mg | ORAL_TABLET | Freq: Every day | ORAL | Status: DC
  Administered 2022-07-27 – 2022-08-01 (×6): 12.5 mg via ORAL
  Filled 2022-07-27 (×6): qty 1

## 2022-07-27 MED ORDER — TORSEMIDE 20 MG PO TABS
10.0000 mg | ORAL_TABLET | Freq: Every day | ORAL | Status: DC
  Administered 2022-07-27 – 2022-08-01 (×6): 10 mg via ORAL
  Filled 2022-07-27 (×6): qty 1

## 2022-07-27 MED ORDER — ENSURE ENLIVE PO LIQD
237.0000 mL | Freq: Two times a day (BID) | ORAL | Status: DC
  Administered 2022-07-27 – 2022-08-01 (×10): 237 mL via ORAL

## 2022-07-27 MED ORDER — FUROSEMIDE 10 MG/ML IJ SOLN
40.0000 mg | Freq: Once | INTRAMUSCULAR | Status: AC
  Administered 2022-07-27: 40 mg via INTRAVENOUS
  Filled 2022-07-27: qty 4

## 2022-07-27 MED ORDER — AMIODARONE HCL 200 MG PO TABS
200.0000 mg | ORAL_TABLET | Freq: Every day | ORAL | Status: DC
  Administered 2022-07-27 – 2022-08-01 (×6): 200 mg via ORAL
  Filled 2022-07-27 (×6): qty 1

## 2022-07-27 MED ORDER — DEXAMETHASONE 0.5 MG PO TABS
2.0000 mg | ORAL_TABLET | Freq: Every day | ORAL | Status: DC
  Administered 2022-07-27 – 2022-07-30 (×4): 2 mg via ORAL
  Filled 2022-07-27 (×4): qty 4

## 2022-07-27 MED ORDER — ALPRAZOLAM 0.25 MG PO TABS
0.2500 mg | ORAL_TABLET | Freq: Three times a day (TID) | ORAL | Status: DC | PRN
  Administered 2022-07-27 – 2022-07-30 (×7): 0.25 mg via ORAL
  Filled 2022-07-27 (×7): qty 1

## 2022-07-27 MED ORDER — DIGOXIN 125 MCG PO TABS
0.0625 mg | ORAL_TABLET | Freq: Every day | ORAL | Status: DC
  Administered 2022-07-27 – 2022-08-01 (×6): 0.0625 mg via ORAL
  Filled 2022-07-27 (×6): qty 0.5

## 2022-07-27 NOTE — Evaluation (Signed)
Occupational Therapy Evaluation Patient Details Name: Sierra Guzman MRN: [id number] DOB: 12/11/1940 Today's Date: 07/27/2022   History of Present Illness [name] is a 82 year old female with history of hypertension, non-insulin-dependent diabetes mellitus, hyperlipidemia, neuropathy, depression, anxiety, interstitial lung disease on mycophenolate, GERD, who presents emergency department for chief concerns of increased oxygen requirement from [employer].   Clinical Impression   Patient presenting with decreased Ind in self care, balance, functional mobility/transfers, endurance, and safety awareness.  Patient reports being living at home with daughter at baseline on chronic O2 of 4Ls. Pt has recently been at [facility] working with therapy and wishes to return at hospital discharge. She is motivated for therapeutic intervention with encouragement. Pt needs min +2 for safety with chair follow and  ambulating 5' with RR increasing to mid 30's and HR elevated to the 120-130's with activity. She fatigues quickly. Patient will benefit from acute OT to increase overall independence in the areas of ADLs, functional mobility, and safety awareness in order to safely discharge.     Recommendations for follow up therapy are one component of a multi-disciplinary discharge planning process, led by the attending physician.  Recommendations may be updated based on patient status, additional functional criteria and insurance authorization.   Assistance Recommended at Discharge Intermittent Supervision/Assistance  Patient can return home with the following A lot of help with bathing/dressing/bathroom;A little help with walking and/or transfers;Assistance with cooking/housework;Assist for transportation;Help with stairs or ramp for entrance    Functional Status Assessment  Patient has had a recent decline in their functional status and demonstrates the ability to make significant improvements in  function in a reasonable and predictable amount of time.  Equipment Recommendations  Other (comment) (defer to next venue of care)       Precautions / Restrictions Precautions Precautions: Fall Restrictions Weight Bearing Restrictions: No      Mobility Bed Mobility               General bed mobility comments: NT, patient in recliner and remained in recliner    Transfers Overall transfer level: Needs assistance Equipment used: Rolling walker (2 wheels) Transfers: Sit to/from Stand Sit to Stand: Min assist           General transfer comment: +2 chair follow for safety      Balance Overall balance assessment: Needs assistance Sitting-balance support: Feet supported Sitting balance-Leahy Scale: Good     Standing balance support: Bilateral upper extremity supported, During functional activity, Reliant on assistive device for balance Standing balance-Leahy Scale: Fair                             ADL either performed or assessed with clinical judgement   ADL Overall ADL's : Needs assistance/impaired     Grooming: Wash/dry hands;Wash/dry face;Sitting;Set up;Supervision/safety                   Toilet Transfer: Minimal assistance;Rolling walker (2 wheels);+2 for physical assistance;+2 for safety/equipment Toilet Transfer Details (indicate cue type and reason): simulated                 Vision Patient Visual Report: No change from baseline              Pertinent Vitals/Pain Pain Assessment Pain Assessment: No/denies pain     Hand Dominance Right   Extremity/Trunk Assessment Upper Extremity Assessment Upper Extremity Assessment: Generalized weakness   Lower Extremity Assessment Lower Extremity  Assessment: Generalized weakness   Cervical / Trunk Assessment Cervical / Trunk Assessment: Normal   Communication Communication Communication: No difficulties   Cognition Arousal/Alertness: Awake/alert Behavior During Therapy:  WFL for tasks assessed/performed Overall Cognitive Status: Within Functional Limits for tasks assessed                                                  Home Living Family/patient expects to be discharged to:: Skilled nursing facility                                 Additional Comments: O2 in home      Prior Functioning/Environment Prior Level of Function : Needs assist             Mobility Comments: Patient recently went to [facility] from [place] for rehab. She is planning to return there upon discharge. Uses walker for short distances prior to that. Daughter drives her if she leaves home. ADLs Comments: Pt reports Ind at home with daughter for self care tasks and daughter assists with IADLs        OT Problem List: Decreased strength;Cardiopulmonary status limiting activity;Decreased activity tolerance;Decreased safety awareness;Impaired balance (sitting and/or standing);Decreased knowledge of use of DME or AE      OT Treatment/Interventions: Self-care/ADL training;Therapeutic exercise;Therapeutic activities;Energy conservation;DME and/or AE instruction;Patient/family education;Manual therapy;Balance training    OT Goals(Current goals can be found in the care plan section) Acute Rehab OT Goals Patient Stated Goal: to get back to rehab and then to live with my daughter OT Goal Formulation: With patient Time For Goal Achievement: 08/11/2022 Potential to Achieve Goals: Fair ADL Goals Pt Will Perform Grooming: with supervision;standing Pt Will Perform Lower Body Dressing: with supervision;sit to/from stand Pt Will Transfer to Toilet: with supervision;ambulating Pt Will Perform Toileting - Clothing Manipulation and hygiene: with supervision;sit to/from stand  OT Frequency: Min 2X/week    Co-evaluation PT/OT/SLP Co-Evaluation/Treatment: Yes Reason for Co-Treatment: Necessary to address cognition/behavior during functional activity;For  patient/therapist safety;To address functional/ADL transfers PT goals addressed during session: Mobility/safety with mobility;Balance;Proper use of DME OT goals addressed during session: ADL's and self-care;Strengthening/ROM      AM-PAC OT "6 Clicks" Daily Activity     Outcome Measure Help from another person eating meals?: None Help from another person taking care of personal grooming?: A Little Help from another person toileting, which includes using toliet, bedpan, or urinal?: A Lot Help from another person bathing (including washing, rinsing, drying)?: A Lot Help from another person to put on and taking off regular upper body clothing?: A Little Help from another person to put on and taking off regular lower body clothing?: A Lot 6 Click Score: 16   End of Session Equipment Utilized During Treatment: Rolling walker (2 wheels) Nurse Communication: Mobility status  Activity Tolerance: Patient limited by fatigue Patient left: in chair;with call bell/phone within reach  OT Visit Diagnosis: Unsteadiness on feet (R26.81);Muscle weakness (generalized) (M62.81)                Time: 1610-9604 OT Time Calculation (min): 14 min Charges:  OT General Charges $OT Visit: 1 Visit OT Evaluation $OT Eval Moderate Complexity: 1 [address], [place], OTR/L , CBIS ascom [phone]  07/27/22, 2:17 PM

## 2022-07-27 NOTE — Progress Notes (Signed)
Hypoglycemic Event  CBG: 66  at 16:11  Treatment: Ensure and 4 oz juice/soda  Symptoms: None  Follow-up CBG: Time: 17:47 CBG Result:175  Possible Reasons for Event: Unknown      Renley Banwart M

## 2022-07-27 NOTE — TOC Benefit Eligibility Note (Signed)
Patient Advocate Encounter  Insurance verification completed.    The patient is currently admitted and upon discharge could be taking Eliquis 5 mg.  The current 30 day co-pay is $11.20.   The patient is insured through [employer] Medicare Part D   This test claim was processed through [facility]- copay amounts may vary at other pharmacies due to pharmacy/plan contracts, or as the patient moves through the different stages of their insurance plan.  Viviane Semidey, CPHT Pharmacy Patient Advocate Specialist [facility] Patient Advocate Team Direct Number: [phone]  Fax: [phone]       

## 2022-07-27 NOTE — Progress Notes (Signed)
This charge RN was notified by a fellow RN and the NT that the patient was requesting to have a new nurse assigned. They shared she was difficult to understand due to the bipap. The patient had expressed to them separately that she didn't think her bedside nurse could understand her and she could not understand the nurse.   The bedside nurse came to this RN requesting assistance in a linen change due to the purewick not catching all of her urine. While at the bedside the patient again stated she would like a new nurse that "can speak english and understand it." In response, this RN stated after her bed change was complete and she was more comfortable it would be looked into.   Shortly after, the bedside RN asked if this RN would feel comfortable with the patient receiving the diazepam ordered and if so, could this RN administer it to the patient.   At the bedside, the patient requested a phone call to her daughter, Sierra Guzman, regarding the diazepam. It was explained to Sierra Guzman by this RN that her mother seemed to be anxious, restless, shaky, and working against the bipap causing her further discomfort. Sierra Guzman was made aware of her mother's request for a new nurse and the plan to administer the medication as well as have this charge RN assist in monitoring the patient for the remainder of the shift due to staffing/assignment constraints. Sierra Guzman expressed that she had already spoken with her mother about talking medication for anxiety and was surprised that she wanted her to be called to confirm it was ok. Ultimately, Sierra Guzman agreed with the plan and expressed her support and comfort with the medication being administered and the monitoring/not changing nursing assignments. The phone was placed up to the patient so she could hear Sierra Guzman's confirmation. All further questions and concerns addressed and will continue to monitor the patient closely.

## 2022-07-27 NOTE — Progress Notes (Signed)
Progress Note   Patient: Sierra Guzman ZOX:[id number] DOB: 1940-12-31 DOA: 07/26/2022     1 DOS: the patient was seen and examined on 07/27/2022   Brief hospital course: [name] is a 82 year old female with history of hypertension, non-insulin-dependent diabetes mellitus, hyperlipidemia, neuropathy, depression, anxiety, interstitial lung disease on mycophenolate, GERD, who presents emergency department for chief concerns of increased oxygen requirement from [employer].  Vitals showed temperature of 98.8, respiration rate of 15, heart rate 92, blood pressure 115/60, SpO2 of 100% on 6 L nasal cannula.  Serum sodium is 138, potassium 4.3, chloride 95, bicarb 34, BUN of 22, serum creatinine 0.79, nonfasting blood glucose 138, WBC 11.6, hemoglobin 10.3, platelets of 226.  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 Patient admitted to the hospital for evaluation of worsening shortness of breath from her baseline Last 2D echocardiogram from 03/24 showed an LVEF of 30 to 35% with LV global hypokinesis and grade 1 diastolic dysfunction Patient received a dose of Lasix 40 mg IV and has been started back on torsemide Continue spironolactone Optimize blood pressure control     Interstitial lung disease with chronic respiratory failure Continue mycophenolate 250 mg daily twice daily  Continue dexamethasone    Chronic atrial fibrillation Continue amiodarone, digoxin and Eliquis  as primary prevention for an acute stroke Not on beta-blockers as patient is unable to tolerate      Pressure injury of skin (Stage 2 pressure injury on the coccyx) Present on admission Wound care consult  Coronary artery disease Aspirin 81 mg daily resumed    Anxiety Continue as needed Xanax    At risk for malnutrition BMI 17.4 Will start patient on Ensure twice daily            Subjective: Patient is seen and examined at the bedside.  Complains of exertional shortness of breath.  Noted to have a drop in her pulse oximetry while on oxygen with exertion  Physical Exam: Vitals:   07/27/22 0900 07/27/22 1000 07/27/22 1100 07/27/22 1200  BP: (!) 122/58 (!) 119/56 135/63 124/76  Pulse: 71 75 75 73  Resp: (!) 32 (!) 26 (!) 37 20  Temp:      TempSrc:      SpO2: 95% 94% 100% 100%  Weight:      Height:       Constitutional: appears age up, frail, cachectic appearing, NAD, calm, comfortable Eyes: PERRL, lids and conjunctivae normal ENMT: Mucous membranes are moist. Posterior pharynx clear of any exudate or lesions. Age-appropriate dentition. Hearing appropriate Neck: normal, supple, no masses, no thyromegaly Respiratory: Generalized decreased lung sounds bilaterally, no wheezing, no crackles. Normal respiratory effort. No accessory muscle use.  Cardiovascular: Regular rate and rhythm, no murmurs / rubs / gallops. No extremity edema. 2+ pedal pulses. No carotid bruits.  Abdomen: no tenderness, no masses palpated, no hepatosplenomegaly. Bowel sounds positive.  Musculoskeletal: no clubbing / cyanosis. No joint deformity upper and lower extremities. Good ROM, no contractures, no atrophy. Normal muscle tone.  Skin: no rashes, lesions,  ulcers. No induration Neurologic: Sensation intact. Strength 5/5 in all 4.  Psychiatric: Normal judgment and insight. Alert and oriented x 3. Normal mood.     Data Reviewed: Labs reviewed. There are no new results to review at this  time.  Family Communication: No family at bedside.  Discussed plan of care with patient  Disposition: Status is: Inpatient Remains inpatient appropriate because: Requiring treatment for acute on chronic hypoxic respiratory failure as well as acute on chronic combined CHF  Planned Discharge Destination: Skilled nursing facility    Time spent: 38 minutes  Author: Lucile Shutters, MD 07/27/2022 12:57 PM  For on call review [email or web address].

## 2022-07-27 NOTE — Consult Note (Signed)
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  She was recently seen at [facility] and was discharged to [facility] and was noted to have worsening hypoxemia subsequently brought to this hospital.  While at [facility] she was requiring 8 L nasal cannula and was saturating only 83% on the settings.  While in the intensive care unit she is slowly improved with mild tachypnea and now weaned down to 6 L of oxygen via nasal cannula.  She received Solu-Medrol on arrival and is also being treated empirically for pneumonia.  She had CT PE performed on arrival with findings of interstitial and airspace opacification with bronchiectasis bilaterally and pulmonary fibrosis.  Lines/tubes : External Urinary Catheter (Active)     External Urinary Catheter (Active)  Collection Container Dedicated Suction Canister 07/27/22 0400  Suction (Verified suction is between 40-80 mmHg) Yes 07/26/22 2300  Securement Method Securing device (Describe) 07/27/22 0400  Site Assessment Clean;Dry 07/27/22 0400    Microbiology/Sepsis markers: Results for orders placed or performed during the hospital encounter of 07/26/22  Resp panel by RT-PCR (RSV, Flu A&B, Covid) Anterior Nasal Swab     Status: None   Collection Time: 07/26/22  3:52 PM   Specimen: Anterior Nasal Swab  Result  Value Ref Range Status   SARS Coronavirus 2 by RT PCR NEGATIVE NEGATIVE Final    Comment: (NOTE) SARS-CoV-2 target nucleic acids are NOT DETECTED.  The SARS-CoV-2 RNA is generally detectable in upper respiratory specimens during the acute phase of infection. The lowest concentration of SARS-CoV-2 viral copies this assay can detect is 138 copies/mL. A negative result does not preclude SARS-Cov-2 infection and should not be used as the sole basis for treatment or other patient management decisions. A negative result may occur with  improper specimen collection/handling, submission of specimen other than nasopharyngeal swab, presence of viral mutation(s) within the areas targeted by this assay, and inadequate number of viral copies(<138 copies/mL). A negative result must be combined with clinical observations, patient history, and epidemiological information. The expected result is Negative.  Fact Sheet  for Patients:  BloggerCourse.com  Fact Sheet for Healthcare Providers:  SeriousBroker.it  This test is no t yet approved or cleared by the [place] FDA and  has been authorized for detection and/or diagnosis of SARS-CoV-2 by FDA under an Emergency Use Authorization (EUA). This EUA will remain  in effect (meaning this test can be used) for the duration of the COVID-19 declaration under Section 564(b)(1) of the Act, 21 U.S.C.section 360bbb-3(b)(1), unless the authorization is terminated  or revoked sooner.       Influenza A by PCR NEGATIVE NEGATIVE Final   Influenza B by PCR NEGATIVE NEGATIVE Final    Comment: (NOTE) The Xpert Xpress SARS-CoV-2/FLU/RSV plus assay is intended as an aid in the diagnosis of influenza from Nasopharyngeal swab specimens and should not be used as a sole basis for treatment. Nasal washings and aspirates are unacceptable for Xpert Xpress SARS-CoV-2/FLU/RSV testing.  Fact Sheet for  Patients: BloggerCourse.com  Fact Sheet for Healthcare Providers: SeriousBroker.it  This test is not yet approved or cleared by the [place] FDA and has been authorized for detection and/or diagnosis of SARS-CoV-2 by FDA under an Emergency Use Authorization (EUA). This EUA will remain in effect (meaning this test can be used) for the duration of the COVID-19 declaration under Section 564(b)(1) of the Act, 21 U.S.C. section 360bbb-3(b)(1), unless the authorization is terminated or revoked.     Resp Syncytial Virus by PCR NEGATIVE NEGATIVE Final    Comment: (NOTE) Fact Sheet for Patients: BloggerCourse.com  Fact Sheet for Healthcare Providers: SeriousBroker.it  This test is not yet approved or cleared by the [place] FDA and has been authorized for detection and/or diagnosis of SARS-CoV-2 by FDA under an Emergency Use Authorization (EUA). This EUA will remain in effect (meaning this test can be used) for the duration of the COVID-19 declaration under Section 564(b)(1) of the Act, 21 U.S.C. section 360bbb-3(b)(1), unless the authorization is terminated or revoked.  Performed at [facility], [address] [place], [place] [address]   MRSA Next Gen by PCR, Nasal     Status: None   Collection Time: 07/26/22 10:40 PM   Specimen: Nasal Mucosa; Nasal Swab  Result Value Ref Range Status   MRSA by PCR Next Gen NOT DETECTED NOT DETECTED Final    Comment: (NOTE) The GeneXpert MRSA Assay (FDA approved for NASAL specimens only), is one component of a comprehensive MRSA colonization surveillance program. It is not intended to diagnose MRSA infection nor to guide or monitor treatment for MRSA infections. Test performance is not FDA approved in patients less than 83 years old. Performed at [facility], [address] [place], [place] [address]      Anti-infectives:  Anti-infectives (From admission, onward)    Start     Dose/Rate Route Frequency Ordered Stop   07/27/22 1800  cefTRIAXone (ROCEPHIN) 1 g in sodium chloride 0.9 % 100 mL IVPB  Status:  Discontinued        1 g 200 mL/hr over 30 Minutes Intravenous Every 24 hours 07/26/22 1955 07/27/22 0901   07/26/22 2045  azithromycin (ZITHROMAX) 500 mg in sodium chloride 0.9 % 250 mL IVPB  Status:  Discontinued        500 mg 250 mL/hr over 60 Minutes Intravenous Every 24 hours 07/26/22 2031 07/27/22 0901   07/26/22 2000  cefTRIAXone (ROCEPHIN) 1 g in sodium chloride 0.9 % 100 mL IVPB  Status:  Discontinued        1 g 200 mL/hr  over 30 Minutes Intravenous Every 24 hours 07/26/22 1947 07/26/22 1955   07/26/22 1730  cefTRIAXone (ROCEPHIN) 2 g in sodium chloride 0.9 % 100 mL IVPB  Status:  Discontinued        2 g 200 mL/hr over 30 Minutes Intravenous Every 24 hours 07/26/22 1722 07/26/22 1908   07/26/22 1730  azithromycin (ZITHROMAX) 500 mg in sodium chloride 0.9 % 250 mL IVPB  Status:  Discontinued        500 mg 250 mL/hr over 60 Minutes Intravenous Every 24 hours 07/26/22 1722 07/26/22 1908   07/26/22 1715  azithromycin (ZITHROMAX) 500 mg in sodium chloride 0.9 % 250 mL IVPB  Status:  Discontinued        500 mg 250 mL/hr over 60 Minutes Intravenous  Once 07/26/22 1705 07/26/22 1722   07/26/22 1715  cefTRIAXone (ROCEPHIN) 1 g in sodium chloride 0.9 % 100 mL IVPB  Status:  Discontinued        1 g 200 mL/hr over 30 Minutes Intravenous  Once 07/26/22 1705 07/26/22 1722        Consults: Treatment Team:  Vida Rigger, MD    PAST MEDICAL HISTORY   Past Medical History:  Diagnosis Date   Anemia 10/28/2010   unspecified   Anxiety    Aortic valve sclerosis    Arthritis    Asthma    Chronic combined systolic and diastolic CHF (congestive heart failure)    echo- 10/22 LVEF 45-55%, G1DD, moderately reduced RV fxn, mild aortic regurg/stenosis   Chronic kidney disease     Chronic Kidney Disease---Stage III   Coronary artery disease 10/28/2010   DDD (degenerative disc disease), lumbar 2006   Diabetes type 2, controlled 10/28/2010   Dyspnea    GERD (gastroesophageal reflux disease)    Gout 10/28/2010   Heart murmur    History of cardiac catheterization 2006   LAD 50%, D1 50% rest normal.    History of cardiovascular stress test 09/23/2009    lexiscan Normal Cardiolite test, EF 61%. LV size and function - normal   History of echocardiogram 05/27/2008   EF 55%; Mild aortic insufficiency with minimal aortic sclerosis. L vent normal.    Hyperlipidemia    Hyperlipidemia LDL goal <100 10/28/2010   managed by Dr. Burr Medico   Hypertension 10/28/2010   IPF (idiopathic pulmonary fibrosis)    Progressive, with UIP   Mild vitamin D deficiency 02/22/2011   Obesity (BMI 30-39.9) 10/28/2010   unspecified   Osteoarthritis 10/28/2010   bilateral knees, left hip   Personal history of colonic polyps 10/28/2010   Shortness of breath    Syncope 2013   Vitamin B 12 deficiency 10/28/2010     SURGICAL HISTORY   Past Surgical History:  Procedure Laterality Date   BACK SURGERY  2006   DDD   BREAST BIOPSY Right    neg   CARDIAC CATHETERIZATION  2006   EYE SURGERY Bilateral    Cataract Extraction with IOL   JOINT REPLACEMENT Left    Left Total Hip Replacement   JOINT REPLACEMENT     RIGHT 2005; LEFT 2 X 2004, 2011   REPLACEMENT TOTAL KNEE Left    REVISION TOTAL KNEE ARTHROPLASTY Left    Dr. Ernest Pine   TONSILLECTOMY     TOTAL HIP ARTHROPLASTY Right 12/12/2016   Procedure: TOTAL HIP ARTHROPLASTY ANTERIOR APPROACH;  Surgeon: Kennedy Bucker, MD;  Location: [facility] ORS;  Service: Orthopedics;  Laterality: Right;   TOTAL HIP ARTHROPLASTY Left 12/26/2013   Procedure:  ARTHROPLASTY HIP TOTAL; Surgeon: Christell Faith, MD; Location: [facility] OR; Service: Orthopedics; Laterality: left,    TOTAL KNEE ARTHROPLASTY     both knees     FAMILY HISTORY   Family History   Problem Relation Age of Onset   Breast cancer Mother 68   Diabetes Mother    Stroke Mother    Stroke Brother    Breast cancer Cousin        pat cousin     SOCIAL HISTORY   Social History   Tobacco Use   Smoking status: Former    Packs/day: 0.25    Years: 10.00    Additional pack years: 0.00    Total pack years: 2.50    Types: Cigarettes    Quit date: 08/18/1990    Years since quitting: 31.9   Smokeless tobacco: Never  Vaping Use   Vaping Use: Never used  Substance Use Topics   Alcohol use: No   Drug use: No     MEDICATIONS   Current Medication:  Current Facility-Administered Medications:    acetaminophen (TYLENOL) tablet 650 mg, 650 mg, Oral, Q6H PRN **OR** acetaminophen (TYLENOL) suppository 650 mg, 650 mg, Rectal, Q6H PRN, Cox, Amy N, DO   amiodarone (PACERONE) tablet 200 mg, 200 mg, Oral, Daily, Karna Christmas, Leasha Goldberger, MD, 200 mg at 07/27/22 1038   apixaban (ELIQUIS) tablet 2.5 mg, 2.5 mg, Oral, BID, Karna Christmas, Mikaela Hilgeman, MD, 2.5 mg at 07/27/22 1038   aspirin EC tablet 81 mg, 81 mg, Oral, Daily, Cox, Amy N, DO, 81 mg at 07/27/22 1038   Chlorhexidine Gluconate Cloth 2 % PADS 6 each, 6 each, Topical, Q0600, Cox, Amy N, DO   dexamethasone (DECADRON) tablet 2 mg, 2 mg, Oral, Daily, Karna Christmas, Chanell Nadeau, MD, 2 mg at 07/27/22 1039   diazepam (VALIUM) injection 2.5 mg, 2.5 mg, Intravenous, Q6H PRN, Cox, Amy N, DO, 2.5 mg at 07/27/22 0116   digoxin (LANOXIN) tablet 0.0625 mg, 0.0625 mg, Oral, Daily, Karna Christmas, Irais Mottram, MD, 0.0625 mg at 07/27/22 1039   feeding supplement (ENSURE ENLIVE / ENSURE PLUS) liquid 237 mL, 237 mL, Oral, BID BM, Agbata, Tochukwu, MD, 237 mL at 07/27/22 1040   gabapentin (NEURONTIN) capsule 100 mg, 100 mg, Oral, TID, Cox, Amy N, DO, 100 mg at 07/27/22 1038   hydrALAZINE (APRESOLINE) injection 5 mg, 5 mg, Intravenous, Q8H PRN, Cox, Amy N, DO   insulin aspart (novoLOG) injection 0-5 Units, 0-5 Units, Subcutaneous, QHS, Cox, Amy N, DO   insulin aspart (novoLOG) injection  0-9 Units, 0-9 Units, Subcutaneous, TID WC, Cox, Amy N, DO, 2 Units at 07/27/22 1226   ipratropium-albuterol (DUONEB) 0.5-2.5 (3) MG/3ML nebulizer solution 3 mL, 3 mL, Nebulization, Q6H PRN, Cox, Amy N, DO   mycophenolate (CELLCEPT) capsule 250 mg, 250 mg, Oral, BID, Cox, Amy N, DO, 250 mg at 07/27/22 1039   ondansetron (ZOFRAN) tablet 4 mg, 4 mg, Oral, Q6H PRN **OR** ondansetron (ZOFRAN) injection 4 mg, 4 mg, Intravenous, Q6H PRN, Cox, Amy N, DO   senna-docusate (Senokot-S) tablet 1 tablet, 1 tablet, Oral, QHS PRN, Cox, Amy N, DO, 1 tablet at 07/27/22 1044   spironolactone (ALDACTONE) tablet 12.5 mg, 12.5 mg, Oral, Daily, Emile Ringgenberg, MD, 12.5 mg at 07/27/22 1039   torsemide (DEMADEX) tablet 10 mg, 10 mg, Oral, Daily, Karna Christmas, Zoie Sarin, MD, 10 mg at 07/27/22 1043    ALLERGIES   Morphine and related    REVIEW OF SYSTEMS     10 point review of systems is positive for dyspnea, hand and  leg weakness which she states has been progressively getting worse over the last year.  PHYSICAL EXAMINATION   Vital Signs: Temp:  [97.5 F (36.4 C)-98.8 F (37.1 C)] 97.5 F (36.4 C) (04/18 0400) Pulse Rate:  [71-95] 73 (04/18 1200) Resp:  [15-37] 20 (04/18 1200) BP: (95-142)/(54-98) 124/76 (04/18 1200) SpO2:  [91 %-100 %] 100 % (04/18 1200) FiO2 (%):  [50 %] 50 % (04/18 0400) Weight:  [50.4 kg] 50.4 kg (04/17 2245)  GENERAL: Mild distress due to acutely ill status HEAD: Normocephalic, atraumatic.  EYES: Pupils equal, round, reactive to light.  No scleral icterus.  MOUTH: Moist mucosal membrane. NECK: Supple. No thyromegaly. No nodules. No JVD.  PULMONARY: Crepitations and rhonchi bilaterally CARDIOVASCULAR: S1 and S2. Regular rate and rhythm. No murmurs, rubs, or gallops.  GASTROINTESTINAL: Soft, nontender, non-distended. No masses. Positive bowel sounds. No hepatosplenomegaly.  MUSCULOSKELETAL: No swelling, clubbing, or edema.  NEUROLOGIC: Mild distress due to acute  illness SKIN:intact,warm,dry   PERTINENT DATA     Infusions:  Scheduled Medications:  amiodarone  200 mg Oral Daily   apixaban  2.5 mg Oral BID   aspirin EC  81 mg Oral Daily   Chlorhexidine Gluconate Cloth  6 each Topical Q0600   dexamethasone  2 mg Oral Daily   digoxin  0.0625 mg Oral Daily   feeding supplement  237 mL Oral BID BM   gabapentin  100 mg Oral TID   insulin aspart  0-5 Units Subcutaneous QHS   insulin aspart  0-9 Units Subcutaneous TID WC   mycophenolate  250 mg Oral BID   spironolactone  12.5 mg Oral Daily   torsemide  10 mg Oral Daily   PRN Medications: acetaminophen **OR** acetaminophen, diazepam, hydrALAZINE, ipratropium-albuterol, ondansetron **OR** ondansetron (ZOFRAN) IV, senna-docusate Hemodynamic parameters:   Intake/Output: 04/17 0701 - 04/18 0700 In: 1353.3 [IV Piggyback:1353.3] Out: 350 [Urine:350]  Ventilator  Settings: FiO2 (%):  [50 %] 50 %   LAB RESULTS:  Basic Metabolic Panel: Recent Labs  Lab 07/26/22 1341 07/27/22 0416  NA 138 141  K 4.3 5.0  CL 95* 100  CO2 34* 34*  GLUCOSE 138* 135*  BUN 22 26*  CREATININE 0.79 0.96  CALCIUM 8.9 8.9   Liver Function Tests: No results for input(s): "AST", "ALT", "ALKPHOS", "BILITOT", "PROT", "ALBUMIN" in the last 168 hours. No results for input(s): "LIPASE", "AMYLASE" in the last 168 hours. No results for input(s): "AMMONIA" in the last 168 hours. CBC: Recent Labs  Lab 07/26/22 1341 07/27/22 0416  WBC 11.6* 8.2  HGB 10.3* 9.3*  HCT 35.3* 31.8*  MCV 98.6 98.1  PLT 226 215   Cardiac Enzymes: No results for input(s): "CKTOTAL", "CKMB", "CKMBINDEX", "TROPONINI" in the last 168 hours. BNP: Invalid input(s): "POCBNP" CBG: Recent Labs  Lab 07/26/22 2237 07/27/22 0750 07/27/22 1136  GLUCAP 181* 111* 172*       IMAGING RESULTS:  Imaging: CT Angio Chest Pulmonary Embolism (PE) W or WO Contrast  Result Date: 07/26/2022 CLINICAL DATA:  Chronic respiratory failure with  hypoxia. PE suspected. EXAM: CT ANGIOGRAPHY CHEST WITH CONTRAST TECHNIQUE: Multidetector CT imaging of the chest was performed using the standard protocol during bolus administration of intravenous contrast. Multiplanar CT image reconstructions and MIPs were obtained to evaluate the vascular anatomy. RADIATION DOSE REDUCTION: This exam was performed according to the departmental dose-optimization program which includes automated exposure control, adjustment of the mA and/or kV according to patient size and/or use of iterative reconstruction technique. CONTRAST:  75mL OMNIPAQUE IOHEXOL 350  MG/ML SOLN COMPARISON:  Chest radiographs 07/26/2022 and CTA chest 01/20/2022 FINDINGS: Cardiovascular: Satisfactory opacification of the pulmonary arteries to the segmental level. No acute pulmonary embolism. Cardiomegaly. Trace pericardial effusion. Coronary artery and aortic atherosclerotic calcification. Mediastinum/Nodes: Moderate hiatal hernia. Esophagus is otherwise unremarkable. Similar mediastinal adenopathy. For example a pretracheal node again measures 14 mm in short axis (series 4/image 37). Lungs/Pleura: Diffuse bilateral pulmonary interstitial lung disease with central ground-glass opacity, peripheral reticular opacities and honeycombing. Architectural distortion and bronchiectasis/bronchiolectasis greatest in the lower lungs. Mildly increased ground-glass opacities in the left upper lobe compared to 01/20/2022 may be due to infection or edema. Wispy secretions in the trachea. Upper Abdomen: No acute abnormality. Musculoskeletal: Thoracic spondylosis. No acute fracture or destructive osseous lesion. Review of the MIP images confirms the above findings. IMPRESSION: 1. No acute pulmonary embolism. 2. Chronic interstitial lung disease. New ground-glass opacities in the left upper lobe may be due to edema or infection. 3. Cardiomegaly and coronary artery calcification. 4. Moderate hiatal hernia. Aortic Atherosclerosis  (ICD10-I70.0). Electronically Signed   By: Minerva Fester M.D.   On: 07/26/2022 20:19   DG Chest 2 View  Result Date: 07/26/2022 CLINICAL DATA:  SOB EXAM: CHEST - 2 VIEW COMPARISON:  01/19/2022 FINDINGS: Enlarged cardiac silhouette. Extensive pulmonary interstitial fibrosis. No pneumothorax or pleural effusion identified. No focal alveolar consolidation. Osseous structures appear grossly intact. IMPRESSION: Enlarged cardiac silhouette.  Extensive pulmonary fibrosis. Electronically Signed   By: Layla Maw M.D.   On: 07/26/2022 14:26   @PROBHOSP @ CT Angio Chest Pulmonary Embolism (PE) W or WO Contrast  Result Date: 07/26/2022 CLINICAL DATA:  Chronic respiratory failure with hypoxia. PE suspected. EXAM: CT ANGIOGRAPHY CHEST WITH CONTRAST TECHNIQUE: Multidetector CT imaging of the chest was performed using the standard protocol during bolus administration of intravenous contrast. Multiplanar CT image reconstructions and MIPs were obtained to evaluate the vascular anatomy. RADIATION DOSE REDUCTION: This exam was performed according to the departmental dose-optimization program which includes automated exposure control, adjustment of the mA and/or kV according to patient size and/or use of iterative reconstruction technique. CONTRAST:  75mL OMNIPAQUE IOHEXOL 350 MG/ML SOLN COMPARISON:  Chest radiographs 07/26/2022 and CTA chest 01/20/2022 FINDINGS: Cardiovascular: Satisfactory opacification of the pulmonary arteries to the segmental level. No acute pulmonary embolism. Cardiomegaly. Trace pericardial effusion. Coronary artery and aortic atherosclerotic calcification. Mediastinum/Nodes: Moderate hiatal hernia. Esophagus is otherwise unremarkable. Similar mediastinal adenopathy. For example a pretracheal node again measures 14 mm in short axis (series 4/image 37). Lungs/Pleura: Diffuse bilateral pulmonary interstitial lung disease with central ground-glass opacity, peripheral reticular opacities and  honeycombing. Architectural distortion and bronchiectasis/bronchiolectasis greatest in the lower lungs. Mildly increased ground-glass opacities in the left upper lobe compared to 01/20/2022 may be due to infection or edema. Wispy secretions in the trachea. Upper Abdomen: No acute abnormality. Musculoskeletal: Thoracic spondylosis. No acute fracture or destructive osseous lesion. Review of the MIP images confirms the above findings. IMPRESSION: 1. No acute pulmonary embolism. 2. Chronic interstitial lung disease. New ground-glass opacities in the left upper lobe may be due to edema or infection. 3. Cardiomegaly and coronary artery calcification. 4. Moderate hiatal hernia. Aortic Atherosclerosis (ICD10-I70.0). Electronically Signed   By: Minerva Fester M.D.   On: 07/26/2022 20:19   DG Chest 2 View  Result Date: 07/26/2022 CLINICAL DATA:  SOB EXAM: CHEST - 2 VIEW COMPARISON:  01/19/2022 FINDINGS: Enlarged cardiac silhouette. Extensive pulmonary interstitial fibrosis. No pneumothorax or pleural effusion identified. No focal alveolar consolidation. Osseous structures appear grossly intact. IMPRESSION: Enlarged cardiac  silhouette.  Extensive pulmonary fibrosis. Electronically Signed   By: Layla Maw M.D.   On: 07/26/2022 14:26     ASSESSMENT AND PLAN    -Multidisciplinary rounds held today  Acute on chronic hypoxic Respiratory Failure -Due to mild pulmonary edema likely related to CHF -Have given IV Lasix x 1 and started her on Aldactone and torsemide combination p.o., most recent renal function from today is at reference range -Reviewed CT PE protocol there is no pulmonary embolism, no consolidated infiltrate however mild left upper lung zone groundglass infiltrate noted which may be due to edema. -Supportive care with incentive spirometry as well as flutter valve.  Will initiate PT OT and out of bed to chair today -Continue dexamethasone 2 mg p.o. daily and CellCept 500 twice daily -There are no  signs of active infection and I have DC'd Rocephin and Zithromax to avoid any unnecessary adverse effects such as diarrhea and skin rash  Chronic atrial fibrillation -Eliquis 2.5 mg twice daily, digoxin 0.65 mcg, amiodarone 200 p.o. daily -Patient does not tolerate beta-blockers -follow up cardiac enzymes as indicated ICU monitoring  Severe anxiety disorder -Patient has had a prescription of benzodiazepines before however becomes severely sedated and reports feelings of presyncope    GI/Nutrition GI PROPHYLAXIS as indicated DIET-->TF's as tolerated Constipation protocol as indicated  ENDO - ICU hypoglycemic\Hyperglycemia protocol -check FSBS per protocol   ELECTROLYTES -follow labs as needed -replace as needed -pharmacy consultation   DVT/GI PRX ordered -SCDs  TRANSFUSIONS AS NEEDED MONITOR FSBS ASSESS the need for LABS as needed  Critical care provider statement:   Total critical care time: 33 minutes   Performed by: Karna Christmas MD   Critical care time was exclusive of separately billable procedures and treating other patients.   Critical care was necessary to treat or prevent imminent or life-threatening deterioration.   Critical care was time spent personally by me on the following activities: development of treatment plan with patient and/or surrogate as well as nursing, discussions with consultants, evaluation of patient's response to treatment, examination of patient, obtaining history from patient or surrogate, ordering and performing treatments and interventions, ordering and review of laboratory studies, ordering and review of radiographic studies, pulse oximetry and re-evaluation of patient's condition.    Vida Rigger, M.D.  Pulmonary & Critical Care Medicine      This document was prepared using Dragon voice recognition software and may include unintentional dictation errors.

## 2022-07-27 NOTE — Consult Note (Signed)
WOC Nurse Consult Note: WOC consult requested for Stage 2 pressure injury to coccyx.  Performed remotely after review of progress notes and the bedside nursing wound care flow sheet. This was noted to be present on admission; 2.5X.5cm and pink. These types of wounds can be treated independently by the bedside nurses using the Skin care order set in Epic as follows, "Foam dressing to coccyx, change Q 3 days or PRN soiling." Please re-consult if further assistance is needed.  Thank-you,  Cammie Mcgee MSN, RN, CWOCN, [place], CNS [phone]

## 2022-07-27 NOTE — Evaluation (Signed)
Physical Therapy Evaluation Patient Details Name: Sierra Guzman MRN: [id number] DOB: 29-Apr-1940 Today's Date: 07/27/2022  History of Present Illness  Ms. Eshal Propps is a 82 year old female with history of hypertension, non-insulin-dependent diabetes mellitus, hyperlipidemia, neuropathy, depression, anxiety, interstitial lung disease on mycophenolate, GERD, who presents emergency department for chief concerns of increased oxygen requirement from [employer].   Clinical Impression  Patient received in recliner, just finishing bath. She is agreeable to PT assessment, however requires increased time due to increased RR, HR and SOB. (O2 sats at 100% on 6 liters during session). Patient is able to stand from recliner with min guard and ambulated 5 feet with RW. Close chair follow. She is limited by fatigue and sob, requiring to sit back down and is unable to do more at this time. She will continue to benefit from skilled PT to improve strength and activity tolerance.         Recommendations for follow up therapy are one component of a multi-disciplinary discharge planning process, led by the attending physician.  Recommendations may be updated based on patient status, additional functional criteria and insurance authorization.  Follow Up Recommendations Can patient physically be transported by private vehicle: No     Assistance Recommended at Discharge Frequent or constant Supervision/Assistance  Patient can return home with the following  A little help with walking and/or transfers;A little help with bathing/dressing/bathroom;Help with stairs or ramp for entrance;Assist for transportation    Equipment Recommendations None recommended by PT  Recommendations for Other Services       Functional Status Assessment Patient has had a recent decline in their functional status and demonstrates the ability to make significant improvements in function in a reasonable and predictable amount of time.      Precautions / Restrictions Precautions Precautions: Fall Restrictions Weight Bearing Restrictions: No      Mobility  Bed Mobility               General bed mobility comments: NT, patient in recliner and remained in recliner    Transfers Overall transfer level: Needs assistance Equipment used: Rolling walker (2 wheels) Transfers: Sit to/from Stand Sit to Stand: Min guard                Ambulation/Gait Ambulation/Gait assistance: Min guard Gait Distance (Feet): 5 Feet Assistive device: Rolling walker (2 wheels) Gait Pattern/deviations: Step-through pattern, Decreased step length - right, Decreased step length - left, Decreased stride length, Trunk flexed Gait velocity: decr     General Gait Details: patient limited by sob with mobility. HR up to 130s and RR in 30s.  Stairs            Wheelchair Mobility    Modified Rankin (Stroke Patients Only)       Balance Overall balance assessment: Needs assistance Sitting-balance support: Feet supported Sitting balance-Leahy Scale: Good     Standing balance support: Bilateral upper extremity supported, During functional activity, Reliant on assistive device for balance Standing balance-Leahy Scale: Fair                               Pertinent Vitals/Pain Pain Assessment Pain Assessment: No/denies pain    Home Living Family/patient expects to be discharged to:: Skilled nursing facility                   Additional Comments: O2 in home    Prior Function  Mobility Comments: Patient recently went to [facility] from [place] for rehab. She is planning to return there upon discharge. Uses walker for short distances prior to that. Daughter drives her if she leaves home.       Hand Dominance   Dominant Hand: Right    Extremity/Trunk Assessment   Upper Extremity Assessment Upper Extremity Assessment: Defer to OT evaluation    Lower Extremity  Assessment Lower Extremity Assessment: Generalized weakness    Cervical / Trunk Assessment Cervical / Trunk Assessment: Normal  Communication   Communication: No difficulties  Cognition Arousal/Alertness: Awake/alert Behavior During Therapy: WFL for tasks assessed/performed Overall Cognitive Status: Within Functional Limits for tasks assessed                                          General Comments      Exercises     Assessment/Plan    PT Assessment Patient needs continued PT services  PT Problem List Decreased strength;Decreased activity tolerance;Decreased balance;Decreased mobility;Cardiopulmonary status limiting activity       PT Treatment Interventions DME instruction;Gait training;Functional mobility training;Therapeutic activities;Patient/family education;Therapeutic exercise;Balance training    PT Goals (Current goals can be found in the Care Plan section)  Acute Rehab PT Goals Patient Stated Goal: to return to rehab to get stronger PT Goal Formulation: With patient Time For Goal Achievement: 08/08/22 Potential to Achieve Goals: Good    Frequency Min 2X/week     Co-evaluation PT/OT/SLP Co-Evaluation/Treatment: Yes Reason for Co-Treatment: Necessary to address cognition/behavior during functional activity;For patient/therapist safety;To address functional/ADL transfers PT goals addressed during session: Mobility/safety with mobility;Balance;Proper use of DME         AM-PAC PT "6 Clicks" Mobility  Outcome Measure Help needed turning from your back to your side while in a flat bed without using bedrails?: A Lot Help needed moving from lying on your back to sitting on the side of a flat bed without using bedrails?: A Lot Help needed moving to and from a bed to a chair (including a wheelchair)?: A Little Help needed standing up from a chair using your arms (e.g., wheelchair or bedside chair)?: A Little Help needed to walk in hospital room?: A  Lot Help needed climbing 3-5 steps with a railing? : Total 6 Click Score: 13    End of Session Equipment Utilized During Treatment: Gait belt;Oxygen Activity Tolerance: Patient limited by fatigue;Other (comment) (SOB) Patient left: in chair;with call bell/phone within reach Nurse Communication: Mobility status PT Visit Diagnosis: Other abnormalities of gait and mobility (R26.89);Muscle weakness (generalized) (M62.81);Difficulty in walking, not elsewhere classified (R26.2);Unsteadiness on feet (R26.81)    Time: 4098-1191 PT Time Calculation (min) (ACUTE ONLY): 13 min   Charges:   PT Evaluation $PT Eval Moderate Complexity: 1 Mod          Mikhail Hallenbeck, PT, GCS 07/27/22,11:41 AM

## 2022-07-28 DIAGNOSIS — J9621 Acute and chronic respiratory failure with hypoxia: Secondary | ICD-10-CM | POA: Diagnosis not present

## 2022-07-28 LAB — GLUCOSE, CAPILLARY
Glucose-Capillary: 135 mg/dL — ABNORMAL HIGH (ref 70–99)
Glucose-Capillary: 207 mg/dL — ABNORMAL HIGH (ref 70–99)
Glucose-Capillary: 102 mg/dL — ABNORMAL HIGH (ref 70–99)
Glucose-Capillary: 198 mg/dL — ABNORMAL HIGH (ref 70–99)

## 2022-07-28 LAB — CULTURE, BLOOD (ROUTINE X 2): Culture: NO GROWTH

## 2022-07-28 MED ORDER — MIRTAZAPINE 15 MG PO TABS
30.0000 mg | ORAL_TABLET | Freq: Every day | ORAL | Status: DC
  Administered 2022-07-28 – 2022-07-31 (×4): 30 mg via ORAL
  Filled 2022-07-28 (×4): qty 2

## 2022-07-28 MED ORDER — VITAMIN D 25 MCG (1000 UNIT) PO TABS
1000.0000 [IU] | ORAL_TABLET | Freq: Every day | ORAL | Status: DC
  Administered 2022-07-28 – 2022-08-01 (×5): 1000 [IU] via ORAL
  Filled 2022-07-28 (×5): qty 1

## 2022-07-28 MED ORDER — PANTOPRAZOLE SODIUM 40 MG PO TBEC
40.0000 mg | DELAYED_RELEASE_TABLET | Freq: Every day | ORAL | Status: DC
  Administered 2022-07-28 – 2022-08-01 (×5): 40 mg via ORAL
  Filled 2022-07-28 (×5): qty 1

## 2022-07-28 MED ORDER — LORATADINE 10 MG PO TABS
10.0000 mg | ORAL_TABLET | Freq: Every day | ORAL | Status: DC
  Administered 2022-07-28 – 2022-08-01 (×5): 10 mg via ORAL
  Filled 2022-07-28 (×5): qty 1

## 2022-07-28 MED ORDER — MAGNESIUM OXIDE 400 MG PO TABS
400.0000 mg | ORAL_TABLET | Freq: Every day | ORAL | Status: DC
  Administered 2022-07-28 – 2022-08-01 (×5): 400 mg via ORAL
  Filled 2022-07-28 (×10): qty 1

## 2022-07-28 MED ORDER — SERTRALINE HCL 50 MG PO TABS
100.0000 mg | ORAL_TABLET | Freq: Every day | ORAL | Status: DC
  Administered 2022-07-28 – 2022-08-01 (×5): 100 mg via ORAL
  Filled 2022-07-28 (×5): qty 2

## 2022-07-28 NOTE — Progress Notes (Signed)
PROGRESS NOTE    [name]   ZOX:[id number] DOB: 10-17-1940  DOA: 07/26/2022 Date of Service: 07/28/22 PCP: Ethelda Chick, MD     Brief Narrative / Hospital Course:  [name] is a 82 year old female with history of hypertension, non-insulin-dependent diabetes mellitus, hyperlipidemia, neuropathy, depression, anxiety, interstitial lung disease on mycophenolate, GERD, who presents emergency department for chief concerns of increased oxygen requirement from [facility]. Note chronic O2 use between 2.5-5 depending on the activity. Known pressure injury buttocks. C/o dysuria, subjective fever.  04/17: RR 15, heart rate 92, 115/60, SpO2 of 100% on 6 L [place]. DuoNebs one-time dose, Solu-Medrol 125 mg IV one-time dose, azithromycin and ceftriaxone for CAP.  CT PE with findings of interstitial and airspace opacification with bronchiectasis bilaterally and pulmonary fibrosis.  04/18: seen by pulmonology - added Lasix, started aldactone and torsemide po, continue steroids and CellCept, d/c antibiotics.  04/19: SpO2 100% 4L [place]. BCx NGx2d. Still needing BiPap qhs. PT/OT recs for SNF, TOC consulted   Consultants:  Pulmonology   Procedures: none      ASSESSMENT & PLAN:   Principal Problem:   Acute on chronic respiratory failure with hypoxia Active Problems:   SIRS (systemic inflammatory response syndrome)   Interstitial lung disease   Hypertension   Pressure injury of skin   Coronary artery disease   DDD (degenerative disc disease), cervical   Acute hypoxemic respiratory failure   Dysuria   Anxiety  Acute on chronic respiratory failure with hypoxia and hypercapnia Continue oxygen supplementation to maintain SpO2 greater than 92% BiPAP continuous initially -->  now on [place], continue BiPap qhs   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 Last 2D echocardiogram from 03/24 showed an LVEF of 30 to 35% with LV global hypokinesis and grade 1 diastolic  dysfunction S/p Lasix  Continue torsemide and spironolactone Optimize blood pressure control   SIRS (systemic inflammatory response syndrome) Sepsis ruled out  Abx d/c   Chronic atrial fibrillation Continue amiodarone, digoxin and Eliquis as primary prevention for an acute stroke Not on beta-blockers d/t intolerance   Interstitial lung disease Mycophenolate and dexamethasone resumed   Hypertension Hydralazine 5 mg IV every 8 hours as needed for SBP greater 175, 4 days ordered   Pressure injury of skin Present on admission Routine care    Coronary artery disease Aspirin 81 mg daily resumed   Anxiety Daughter reports the patient has done well with Valium in the past and that Ativan is too strong for her Diazepam 2.5 mg IV every 6 hours as needed for anxiety, 3 doses ordered.   diazepam can be increased to 5 mg if 2.5 mg is not sufficient for patient   Dysuria Patient endorses dysuria that started over the last 2 days UA ordered, if positive for leukocytes and/or nitrites, discussed with cross coverage provider to initiate ceftriaxone IV UA was positive for leukocytes, trace.  Will initiate ceftriaxone 1 g IV daily, 5 doses ordered.  Discussed with family and family is in agreement   DDD (degenerative disc disease), cervical Pain control as needed     DVT prophylaxis: lovenox  Pertinent IV fluids/nutrition: no continuous IV fluids, heart healthy diet  Central lines / invasive devices: none  Code Status: DNR/DNI, per discussion with family and confirmed with daughter [name] by admitting hospitalist  ACP documentation reviewed: none on file   Current Admission Status: inpatient   TOC needs / Dispo plan: SNF Barriers to discharge / significant pending items: O2  requirement still above baseline, expect will be here thru the weekend pending SNF placement and improvement in oxygenation              Subjective / Brief ROS:  Patient reports no concerns today other  than feeling tired Denies CP Some SOB w/ activity Pain controlled.  Denies new weakness.  Tolerating diet.  Reports no concerns w/ urination/defecation.   Family Communication: none at this time     Objective Findings:  Vitals:   07/28/22 0751 07/28/22 0800 07/28/22 1000 07/28/22 1200  BP:  95/62 (!) 106/54 (!) 113/57  Pulse:  71 78 71  Resp:  (!) 28 (!) 38 (!) 25  Temp: 98.2 F (36.8 C)     TempSrc: Oral     SpO2:  100% 97% 100%  Weight:      Height:        Intake/Output Summary (Last 24 hours) at 07/28/2022 1305 Last data filed at 07/28/2022 1100 Gross per 24 hour  Intake 600 ml  Output 1600 ml  Net -1000 ml   Filed Weights   07/26/22 2245  Weight: 50.4 kg    Examination:  Physical Exam Constitutional:      General: She is not in acute distress. Cardiovascular:     Rate and Rhythm: Normal rate. Rhythm irregular.  Pulmonary:     Breath sounds: Examination of the right-upper field reveals wheezing and rhonchi. Examination of the left-upper field reveals wheezing and rhonchi. Examination of the right-middle field reveals wheezing, rhonchi and rales. Examination of the left-middle field reveals wheezing, rhonchi and rales. Examination of the right-lower field reveals decreased breath sounds. Examination of the left-lower field reveals decreased breath sounds. Decreased breath sounds, wheezing, rhonchi (scattered) and rales present.  Musculoskeletal:     Right lower leg: No edema.     Left lower leg: No edema.  Skin:    General: Skin is warm and dry.  Neurological:     General: No focal deficit present.     Mental Status: She is alert and oriented to person, place, and time.  Psychiatric:        Mood and Affect: Mood normal.        Behavior: Behavior normal.          Scheduled Medications:   amiodarone  200 mg Oral Daily   apixaban  2.5 mg Oral BID   aspirin EC  81 mg Oral Daily   Chlorhexidine Gluconate Cloth  6 each Topical Q0600   dexamethasone  2  mg Oral Daily   digoxin  0.0625 mg Oral Daily   feeding supplement  237 mL Oral BID BM   gabapentin  100 mg Oral TID   insulin aspart  0-5 Units Subcutaneous QHS   insulin aspart  0-9 Units Subcutaneous TID WC   mycophenolate  250 mg Oral BID   spironolactone  12.5 mg Oral Daily   torsemide  10 mg Oral Daily    Continuous Infusions:   PRN Medications:  acetaminophen **OR** acetaminophen, ALPRAZolam, ondansetron **OR** ondansetron (ZOFRAN) IV, senna-docusate  Antimicrobials from admission:  Anti-infectives (From admission, onward)    Start     Dose/Rate Route Frequency Ordered Stop   07/27/22 1800  cefTRIAXone (ROCEPHIN) 1 g in sodium chloride 0.9 % 100 mL IVPB  Status:  Discontinued        1 g 200 mL/hr over 30 Minutes Intravenous Every 24 hours 07/26/22 1955 07/27/22 0901   07/26/22 2045  azithromycin (ZITHROMAX) 500 mg in  sodium chloride 0.9 % 250 mL IVPB  Status:  Discontinued        500 mg 250 mL/hr over 60 Minutes Intravenous Every 24 hours 07/26/22 2031 07/27/22 0901   07/26/22 2000  cefTRIAXone (ROCEPHIN) 1 g in sodium chloride 0.9 % 100 mL IVPB  Status:  Discontinued        1 g 200 mL/hr over 30 Minutes Intravenous Every 24 hours 07/26/22 1947 07/26/22 1955   07/26/22 1730  cefTRIAXone (ROCEPHIN) 2 g in sodium chloride 0.9 % 100 mL IVPB  Status:  Discontinued        2 g 200 mL/hr over 30 Minutes Intravenous Every 24 hours 07/26/22 1722 07/26/22 1908   07/26/22 1730  azithromycin (ZITHROMAX) 500 mg in sodium chloride 0.9 % 250 mL IVPB  Status:  Discontinued        500 mg 250 mL/hr over 60 Minutes Intravenous Every 24 hours 07/26/22 1722 07/26/22 1908   07/26/22 1715  azithromycin (ZITHROMAX) 500 mg in sodium chloride 0.9 % 250 mL IVPB  Status:  Discontinued        500 mg 250 mL/hr over 60 Minutes Intravenous  Once 07/26/22 1705 07/26/22 1722   07/26/22 1715  cefTRIAXone (ROCEPHIN) 1 g in sodium chloride 0.9 % 100 mL IVPB  Status:  Discontinued        1 g 200 mL/hr over  30 Minutes Intravenous  Once 07/26/22 1705 07/26/22 1722           Data Reviewed:  I have personally reviewed the following...  CBC: Recent Labs  Lab 07/26/22 1341 07/27/22 0416  WBC 11.6* 8.2  HGB 10.3* 9.3*  HCT 35.3* 31.8*  MCV 98.6 98.1  PLT 226 215   Basic Metabolic Panel: Recent Labs  Lab 07/26/22 1341 07/27/22 0416  NA 138 141  K 4.3 5.0  CL 95* 100  CO2 34* 34*  GLUCOSE 138* 135*  BUN 22 26*  CREATININE 0.79 0.96  CALCIUM 8.9 8.9   GFR: Estimated Creatinine Clearance: 35.9 mL/min (by C-G formula based on SCr of 0.96 mg/dL). Liver Function Tests: No results for input(s): "AST", "ALT", "ALKPHOS", "BILITOT", "PROT", "ALBUMIN" in the last 168 hours. No results for input(s): "LIPASE", "AMYLASE" in the last 168 hours. No results for input(s): "AMMONIA" in the last 168 hours. Coagulation Profile: Recent Labs  Lab 07/27/22 0416  INR 1.2   Cardiac Enzymes: No results for input(s): "CKTOTAL", "CKMB", "CKMBINDEX", "TROPONINI" in the last 168 hours. BNP (last 3 results) No results for input(s): "PROBNP" in the last 8760 hours. HbA1C: Recent Labs    07/27/22 0416  HGBA1C 5.5   CBG: Recent Labs  Lab 07/27/22 1611 07/27/22 1747 07/27/22 2125 07/28/22 0733 07/28/22 1131  GLUCAP 66* 175* 97 135* 207*   Lipid Profile: No results for input(s): "CHOL", "HDL", "LDLCALC", "TRIG", "CHOLHDL", "LDLDIRECT" in the last 72 hours. Thyroid Function Tests: No results for input(s): "TSH", "T4TOTAL", "FREET4", "T3FREE", "THYROIDAB" in the last 72 hours. Anemia Panel: No results for input(s): "VITAMINB12", "FOLATE", "FERRITIN", "TIBC", "IRON", "RETICCTPCT" in the last 72 hours. Most Recent Urinalysis On File:     Component Value Date/Time   COLORURINE YELLOW (A) 07/26/2022 1745   APPEARANCEUR CLEAR (A) 07/26/2022 1745   LABSPEC 1.020 07/26/2022 1745   PHURINE 6.0 07/26/2022 1745   GLUCOSEU >=500 (A) 07/26/2022 1745   HGBUR NEGATIVE 07/26/2022 1745    BILIRUBINUR NEGATIVE 07/26/2022 1745   KETONESUR NEGATIVE 07/26/2022 1745   PROTEINUR NEGATIVE 07/26/2022 1745   NITRITE  NEGATIVE 07/26/2022 1745   LEUKOCYTESUR TRACE (A) 07/26/2022 1745   Sepsis Labs: @LABRCNTIP (procalcitonin:4,lacticidven:4) Microbiology: Recent Results (from the past 240 hour(s))  Resp panel by RT-PCR (RSV, Flu A&B, Covid) Anterior Nasal Swab     Status: None   Collection Time: 07/26/22  3:52 PM   Specimen: Anterior Nasal Swab  Result Value Ref Range Status   SARS Coronavirus 2 by RT PCR NEGATIVE NEGATIVE Final    Comment: (NOTE) SARS-CoV-2 target nucleic acids are NOT DETECTED.  The SARS-CoV-2 RNA is generally detectable in upper respiratory specimens during the acute phase of infection. The lowest concentration of SARS-CoV-2 viral copies this assay can detect is 138 copies/mL. A negative result does not preclude SARS-Cov-2 infection and should not be used as the sole basis for treatment or other patient management decisions. A negative result may occur with  improper specimen collection/handling, submission of specimen other than nasopharyngeal swab, presence of viral mutation(s) within the areas targeted by this assay, and inadequate number of viral copies(<138 copies/mL). A negative result must be combined with clinical observations, patient history, and epidemiological information. The expected result is Negative.  Fact Sheet for Patients:  BloggerCourse.com  Fact Sheet for Healthcare Providers:  SeriousBroker.it  This test is no t yet approved or cleared by the [place] FDA and  has been authorized for detection and/or diagnosis of SARS-CoV-2 by FDA under an Emergency Use Authorization (EUA). This EUA will remain  in effect (meaning this test can be used) for the duration of the COVID-19 declaration under Section 564(b)(1) of the Act, 21 U.S.C.section 360bbb-3(b)(1), unless the authorization is  terminated  or revoked sooner.       Influenza A by PCR NEGATIVE NEGATIVE Final   Influenza B by PCR NEGATIVE NEGATIVE Final    Comment: (NOTE) The Xpert Xpress SARS-CoV-2/FLU/RSV plus assay is intended as an aid in the diagnosis of influenza from Nasopharyngeal swab specimens and should not be used as a sole basis for treatment. Nasal washings and aspirates are unacceptable for Xpert Xpress SARS-CoV-2/FLU/RSV testing.  Fact Sheet for Patients: BloggerCourse.com  Fact Sheet for Healthcare Providers: SeriousBroker.it  This test is not yet approved or cleared by the [place] FDA and has been authorized for detection and/or diagnosis of SARS-CoV-2 by FDA under an Emergency Use Authorization (EUA). This EUA will remain in effect (meaning this test can be used) for the duration of the COVID-19 declaration under Section 564(b)(1) of the Act, 21 U.S.C. section 360bbb-3(b)(1), unless the authorization is terminated or revoked.     Resp Syncytial Virus by PCR NEGATIVE NEGATIVE Final    Comment: (NOTE) Fact Sheet for Patients: BloggerCourse.com  Fact Sheet for Healthcare Providers: SeriousBroker.it  This test is not yet approved or cleared by the [place] FDA and has been authorized for detection and/or diagnosis of SARS-CoV-2 by FDA under an Emergency Use Authorization (EUA). This EUA will remain in effect (meaning this test can be used) for the duration of the COVID-19 declaration under Section 564(b)(1) of the Act, 21 U.S.C. section 360bbb-3(b)(1), unless the authorization is terminated or revoked.  Performed at [facility], [address] [place], [place] [address]   Culture, blood (Routine X 2) w Reflex to ID Panel     Status: None (Preliminary result)   Collection Time: 07/26/22  5:45 PM   Specimen: BLOOD  Result Value Ref Range Status   Specimen  Description BLOOD RIGHT ANTECUBITAL  Final   Special Requests   Final    BOTTLES  DRAWN AEROBIC AND ANAEROBIC Blood Culture adequate volume   Culture   Final    NO GROWTH 2 DAYS Performed at [facility], [address] [place], [place] [address]    Report Status PENDING  Incomplete  Culture, blood (Routine X 2) w Reflex to ID Panel     Status: None (Preliminary result)   Collection Time: 07/26/22  5:45 PM   Specimen: BLOOD  Result Value Ref Range Status   Specimen Description BLOOD LEFT ANTECUBITAL  Final   Special Requests   Final    BOTTLES DRAWN AEROBIC AND ANAEROBIC Blood Culture adequate volume   Culture   Final    NO GROWTH 2 DAYS Performed at [facility], [address], [place], [place] [address]    Report Status PENDING  Incomplete  MRSA Next Gen by PCR, Nasal     Status: None   Collection Time: 07/26/22 10:40 PM   Specimen: Nasal Mucosa; Nasal Swab  Result Value Ref Range Status   MRSA by PCR Next Gen NOT DETECTED NOT DETECTED Final    Comment: (NOTE) The GeneXpert MRSA Assay (FDA approved for NASAL specimens only), is one component of a comprehensive MRSA colonization surveillance program. It is not intended to diagnose MRSA infection nor to guide or monitor treatment for MRSA infections. Test performance is not FDA approved in patients less than 3 years old. Performed at [facility], [address] [place], [place] [address]       Radiology Studies last 3 days: CT Angio Chest Pulmonary Embolism (PE) W or WO Contrast  Result Date: 07/26/2022 CLINICAL DATA:  Chronic respiratory failure with hypoxia. PE suspected. EXAM: CT ANGIOGRAPHY CHEST WITH CONTRAST TECHNIQUE: Multidetector CT imaging of the chest was performed using the standard protocol during bolus administration of intravenous contrast. Multiplanar CT image reconstructions and MIPs were obtained to evaluate the vascular anatomy. RADIATION DOSE REDUCTION: This exam was  performed according to the departmental dose-optimization program which includes automated exposure control, adjustment of the mA and/or kV according to patient size and/or use of iterative reconstruction technique. CONTRAST:  75mL OMNIPAQUE IOHEXOL 350 MG/ML SOLN COMPARISON:  Chest radiographs 07/26/2022 and CTA chest 01/20/2022 FINDINGS: Cardiovascular: Satisfactory opacification of the pulmonary arteries to the segmental level. No acute pulmonary embolism. Cardiomegaly. Trace pericardial effusion. Coronary artery and aortic atherosclerotic calcification. Mediastinum/Nodes: Moderate hiatal hernia. Esophagus is otherwise unremarkable. Similar mediastinal adenopathy. For example a pretracheal node again measures 14 mm in short axis (series 4/image 37). Lungs/Pleura: Diffuse bilateral pulmonary interstitial lung disease with central ground-glass opacity, peripheral reticular opacities and honeycombing. Architectural distortion and bronchiectasis/bronchiolectasis greatest in the lower lungs. Mildly increased ground-glass opacities in the left upper lobe compared to 01/20/2022 may be due to infection or edema. Wispy secretions in the trachea. Upper Abdomen: No acute abnormality. Musculoskeletal: Thoracic spondylosis. No acute fracture or destructive osseous lesion. Review of the MIP images confirms the above findings. IMPRESSION: 1. No acute pulmonary embolism. 2. Chronic interstitial lung disease. New ground-glass opacities in the left upper lobe may be due to edema or infection. 3. Cardiomegaly and coronary artery calcification. 4. Moderate hiatal hernia. Aortic Atherosclerosis (ICD10-I70.0). Electronically Signed   By: Minerva Fester M.D.   On: 07/26/2022 20:19   DG Chest 2 View  Result Date: 07/26/2022 CLINICAL DATA:  SOB EXAM: CHEST - 2 VIEW COMPARISON:  01/19/2022 FINDINGS: Enlarged cardiac silhouette. Extensive pulmonary interstitial fibrosis. No pneumothorax or pleural effusion identified. No focal  alveolar consolidation. Osseous structures appear grossly intact.  IMPRESSION: Enlarged cardiac silhouette.  Extensive pulmonary fibrosis. Electronically Signed   By: Layla Maw M.D.   On: 07/26/2022 14:26             LOS: 2 days      Sunnie Nielsen, DO [facility] 07/28/2022, 1:05 PM    Dictation software may have been used to generate the above note. Typos may occur and escape review in typed/dictated notes. Please contact Dr Lyn Hollingshead directly for clarity if needed.  Staff may message me via secure chat in Epic  but this may not receive an immediate response,  please page me for urgent matters!  If 7PM-7AM, please contact night coverage [email or web address]

## 2022-07-28 NOTE — Progress Notes (Signed)
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  She was recently seen at [facility] and was discharged to [facility] and was noted to have worsening hypoxemia subsequently brought to this hospital.  While at [facility] she was requiring 8 L nasal cannula and was saturating only 83% on the settings.  While in the intensive care unit she is slowly improved with mild tachypnea and now weaned down to 6 L of oxygen via nasal cannula.  She received Solu-Medrol on arrival and is also being treated empirically for pneumonia.  She had CT PE performed on arrival with findings of interstitial and airspace opacification with bronchiectasis bilaterally and pulmonary fibrosis.  07/28/22- patient is on 4L/min [place], she reports having good day get out of bed working wit PT.  Her Atrial fibrillation is stable. She is close to baseline in chronically ill state.  She is asking about going home.  She can continue dexamethasone and cellcept at current dose with bactrim SS TIW for PCP ppx.  She should also continue PT/OT upon dc.    Lines/tubes : External Urinary Catheter (Active)     External Urinary Catheter (Active)  Collection Container Dedicated Suction Canister 07/27/22 0400  Suction (Verified suction is between 40-80 mmHg) Yes 07/26/22 2300  Securement Method Securing  device (Describe) 07/27/22 0400  Site Assessment Clean;Dry 07/27/22 0400    Microbiology/Sepsis markers: Results for orders placed or performed during the hospital encounter of 07/26/22  Resp panel by RT-PCR (RSV, Flu A&B, Covid) Anterior Nasal Swab     Status: None   Collection Time: 07/26/22  3:52 PM   Specimen: Anterior Nasal Swab  Result Value Ref Range Status   SARS Coronavirus 2 by RT PCR NEGATIVE NEGATIVE Final    Comment: (NOTE) SARS-CoV-2 target nucleic acids are NOT DETECTED.  The SARS-CoV-2 RNA is generally detectable in upper respiratory specimens during the acute phase of infection. The lowest concentration of SARS-CoV-2 viral copies this assay can detect is 138 copies/mL. A negative result does not preclude SARS-Cov-2 infection and should not be used  as the sole basis for treatment or other patient management decisions. A negative result may occur with  improper specimen collection/handling, submission of specimen other than nasopharyngeal swab, presence of viral mutation(s) within the areas targeted by this assay, and inadequate number of viral copies(<138 copies/mL). A negative result must be combined with clinical observations, patient history, and epidemiological information. The expected result is Negative.  Fact Sheet for Patients:  BloggerCourse.com  Fact Sheet for Healthcare Providers:  SeriousBroker.it  This test is no t yet approved or cleared by the [place] FDA and  has been authorized for detection and/or diagnosis of SARS-CoV-2 by FDA under an Emergency Use Authorization (EUA). This EUA will remain  in effect (meaning this test can be used) for the duration of the COVID-19 declaration under Section 564(b)(1) of the Act, 21 U.S.C.section 360bbb-3(b)(1), unless the authorization is terminated  or revoked sooner.       Influenza A by PCR NEGATIVE NEGATIVE Final   Influenza B by PCR NEGATIVE  NEGATIVE Final    Comment: (NOTE) The Xpert Xpress SARS-CoV-2/FLU/RSV plus assay is intended as an aid in the diagnosis of influenza from Nasopharyngeal swab specimens and should not be used as a sole basis for treatment. Nasal washings and aspirates are unacceptable for Xpert Xpress SARS-CoV-2/FLU/RSV testing.  Fact Sheet for Patients: BloggerCourse.com  Fact Sheet for Healthcare Providers: SeriousBroker.it  This test is not yet approved or cleared by the [place] FDA and has been authorized for detection and/or diagnosis of SARS-CoV-2 by FDA under an Emergency Use Authorization (EUA). This EUA will remain in effect (meaning this test can be used) for the duration of the COVID-19 declaration under Section 564(b)(1) of the Act, 21 U.S.C. section 360bbb-3(b)(1), unless the authorization is terminated or revoked.     Resp Syncytial Virus by PCR NEGATIVE NEGATIVE Final    Comment: (NOTE) Fact Sheet for Patients: BloggerCourse.com  Fact Sheet for Healthcare Providers: SeriousBroker.it  This test is not yet approved or cleared by the [place] FDA and has been authorized for detection and/or diagnosis of SARS-CoV-2 by FDA under an Emergency Use Authorization (EUA). This EUA will remain in effect (meaning this test can be used) for the duration of the COVID-19 declaration under Section 564(b)(1) of the Act, 21 U.S.C. section 360bbb-3(b)(1), unless the authorization is terminated or revoked.  Performed at [facility], [address] [place], [place] [address]   Culture, blood (Routine X 2) w Reflex to ID Panel     Status: None (Preliminary result)   Collection Time: 07/26/22  5:45 PM   Specimen: BLOOD  Result Value Ref Range Status   Specimen Description BLOOD RIGHT ANTECUBITAL  Final   Special Requests   Final    BOTTLES DRAWN AEROBIC AND ANAEROBIC Blood  Culture adequate volume   Culture   Final    NO GROWTH 2 DAYS Performed at [facility], [address] [place], [place] [address]    Report Status PENDING  Incomplete  Culture, blood (Routine X 2) w Reflex to ID Panel     Status: None (Preliminary result)   Collection Time: 07/26/22  5:45 PM   Specimen: BLOOD  Result Value Ref Range Status   Specimen Description BLOOD LEFT ANTECUBITAL  Final   Special Requests   Final    BOTTLES DRAWN AEROBIC AND ANAEROBIC Blood Culture adequate volume   Culture   Final    NO GROWTH 2 DAYS Performed at [facility], [address]  [place], [place] [address]    Report Status PENDING  Incomplete  MRSA Next Gen by PCR, Nasal     Status: None   Collection Time: 07/26/22 10:40 PM   Specimen: Nasal Mucosa; Nasal Swab  Result Value Ref Range Status   MRSA by PCR Next Gen NOT DETECTED NOT DETECTED Final    Comment: (NOTE) The GeneXpert MRSA Assay (FDA approved for NASAL specimens only), is one component of a comprehensive MRSA colonization surveillance program. It is not intended to diagnose MRSA infection nor to guide or monitor treatment for MRSA infections. Test performance is not FDA approved in patients less than 71 years old. Performed at [facility], [address], [place], [place] [address]     Anti-infectives:  Anti-infectives (From admission, onward)    Start     Dose/Rate Route Frequency Ordered Stop   07/27/22 1800  cefTRIAXone (ROCEPHIN) 1 g in sodium chloride 0.9 % 100 mL IVPB  Status:  Discontinued        1 g 200 mL/hr over 30 Minutes Intravenous Every 24 hours 07/26/22 1955 07/27/22 0901   07/26/22 2045  azithromycin (ZITHROMAX) 500 mg in sodium chloride 0.9 % 250 mL IVPB  Status:  Discontinued        500 mg 250 mL/hr over 60 Minutes Intravenous Every 24 hours 07/26/22 2031 07/27/22 0901   07/26/22 2000  cefTRIAXone (ROCEPHIN) 1 g in sodium chloride 0.9 % 100 mL IVPB  Status:  Discontinued         1 g 200 mL/hr over 30 Minutes Intravenous Every 24 hours 07/26/22 1947 07/26/22 1955   07/26/22 1730  cefTRIAXone (ROCEPHIN) 2 g in sodium chloride 0.9 % 100 mL IVPB  Status:  Discontinued        2 g 200 mL/hr over 30 Minutes Intravenous Every 24 hours 07/26/22 1722 07/26/22 1908   07/26/22 1730  azithromycin (ZITHROMAX) 500 mg in sodium chloride 0.9 % 250 mL IVPB  Status:  Discontinued        500 mg 250 mL/hr over 60 Minutes Intravenous Every 24 hours 07/26/22 1722 07/26/22 1908   07/26/22 1715  azithromycin (ZITHROMAX) 500 mg in sodium chloride 0.9 % 250 mL IVPB  Status:  Discontinued        500 mg 250 mL/hr over 60 Minutes Intravenous  Once 07/26/22 1705 07/26/22 1722   07/26/22 1715  cefTRIAXone (ROCEPHIN) 1 g in sodium chloride 0.9 % 100 mL IVPB  Status:  Discontinued        1 g 200 mL/hr over 30 Minutes Intravenous  Once 07/26/22 1705 07/26/22 1722        Consults: Treatment Team:  Vida Rigger, MD    PAST MEDICAL HISTORY   Past Medical History:  Diagnosis Date   Anemia 10/28/2010   unspecified   Anxiety    Aortic valve sclerosis    Arthritis    Asthma    Chronic combined systolic and diastolic CHF (congestive heart failure)    echo- 10/22 LVEF 45-55%, G1DD, moderately reduced RV fxn, mild aortic regurg/stenosis   Chronic kidney disease    Chronic Kidney Disease---Stage III   Coronary artery disease 10/28/2010   DDD (degenerative disc disease), lumbar 2006   Diabetes type 2, controlled 10/28/2010   Dyspnea    GERD (gastroesophageal reflux disease)    Gout 10/28/2010   Heart murmur    History of cardiac catheterization 2006   LAD 50%, D1 50% rest normal.    History of cardiovascular stress  test 09/23/2009    lexiscan Normal Cardiolite test, EF 61%. LV size and function - normal   History of echocardiogram 05/27/2008   EF 55%; Mild aortic insufficiency with minimal aortic sclerosis. L vent normal.    Hyperlipidemia    Hyperlipidemia LDL goal <100 10/28/2010    managed by Dr. Burr Medico   Hypertension 10/28/2010   IPF (idiopathic pulmonary fibrosis)    Progressive, with UIP   Mild vitamin D deficiency 02/22/2011   Obesity (BMI 30-39.9) 10/28/2010   unspecified   Osteoarthritis 10/28/2010   bilateral knees, left hip   Personal history of colonic polyps 10/28/2010   Shortness of breath    Syncope 2013   Vitamin B 12 deficiency 10/28/2010     SURGICAL HISTORY   Past Surgical History:  Procedure Laterality Date   BACK SURGERY  2006   DDD   BREAST BIOPSY Right    neg   CARDIAC CATHETERIZATION  2006   EYE SURGERY Bilateral    Cataract Extraction with IOL   JOINT REPLACEMENT Left    Left Total Hip Replacement   JOINT REPLACEMENT     RIGHT 2005; LEFT 2 X 2004, 2011   REPLACEMENT TOTAL KNEE Left    REVISION TOTAL KNEE ARTHROPLASTY Left    Dr. Ernest Pine   TONSILLECTOMY     TOTAL HIP ARTHROPLASTY Right 12/12/2016   Procedure: TOTAL HIP ARTHROPLASTY ANTERIOR APPROACH;  Surgeon: Kennedy Bucker, MD;  Location: [facility] ORS;  Service: Orthopedics;  Laterality: Right;   TOTAL HIP ARTHROPLASTY Left 12/26/2013   Procedure: ARTHROPLASTY HIP TOTAL; Surgeon: Christell Faith, MD; Location: [facility]; Service: Orthopedics; Laterality: left,    TOTAL KNEE ARTHROPLASTY     both knees     FAMILY HISTORY   Family History  Problem Relation Age of Onset   Breast cancer Mother 48   Diabetes Mother    Stroke Mother    Stroke Brother    Breast cancer Cousin        pat cousin     SOCIAL HISTORY   Social History   Tobacco Use   Smoking status: Former    Packs/day: 0.25    Years: 10.00    Additional pack years: 0.00    Total pack years: 2.50    Types: Cigarettes    Quit date: 08/18/1990    Years since quitting: 31.9   Smokeless tobacco: Never  Vaping Use   Vaping Use: Never used  Substance Use Topics   Alcohol use: No   Drug use: No     MEDICATIONS   Current Medication:  Current Facility-Administered Medications:    acetaminophen  (TYLENOL) tablet 650 mg, 650 mg, Oral, Q6H PRN **OR** acetaminophen (TYLENOL) suppository 650 mg, 650 mg, Rectal, Q6H PRN, Cox, Amy N, DO   ALPRAZolam (XANAX) tablet 0.25 mg, 0.25 mg, Oral, TID PRN, Agbata, Tochukwu, MD, 0.25 mg at 07/27/22 1820   amiodarone (PACERONE) tablet 200 mg, 200 mg, Oral, Daily, Ardice Boyan, MD, 200 mg at 07/28/22 0942   apixaban (ELIQUIS) tablet 2.5 mg, 2.5 mg, Oral, BID, Porter Nakama, MD, 2.5 mg at 07/28/22 0942   aspirin EC tablet 81 mg, 81 mg, Oral, Daily, Cox, Amy N, DO, 81 mg at 07/28/22 0941   Chlorhexidine Gluconate Cloth 2 % PADS 6 each, 6 each, Topical, Q0600, Cox, Amy N, DO, 6 each at 07/28/22 0752   dexamethasone (DECADRON) tablet 2 mg, 2 mg, Oral, Daily, Vida Rigger, MD, 2 mg at 07/28/22 0943   digoxin (LANOXIN) tablet 0.0625  mg, 0.0625 mg, Oral, Daily, Karna Christmas, Ivis Nicolson, MD, 0.0625 mg at 07/28/22 0942   feeding supplement (ENSURE ENLIVE / ENSURE PLUS) liquid 237 mL, 237 mL, Oral, BID BM, Agbata, Tochukwu, MD, 237 mL at 07/28/22 0948   gabapentin (NEURONTIN) capsule 100 mg, 100 mg, Oral, TID, Cox, Amy N, DO, 100 mg at 07/28/22 0942   insulin aspart (novoLOG) injection 0-5 Units, 0-5 Units, Subcutaneous, QHS, Cox, Amy N, DO   insulin aspart (novoLOG) injection 0-9 Units, 0-9 Units, Subcutaneous, TID WC, Cox, Amy N, DO, 1 Units at 07/28/22 9562   mycophenolate (CELLCEPT) capsule 250 mg, 250 mg, Oral, BID, Cox, Amy N, DO, 250 mg at 07/28/22 0943   ondansetron (ZOFRAN) tablet 4 mg, 4 mg, Oral, Q6H PRN **OR** ondansetron (ZOFRAN) injection 4 mg, 4 mg, Intravenous, Q6H PRN, Cox, Amy N, DO   senna-docusate (Senokot-S) tablet 1 tablet, 1 tablet, Oral, QHS PRN, Cox, Amy N, DO, 1 tablet at 07/27/22 1044   spironolactone (ALDACTONE) tablet 12.5 mg, 12.5 mg, Oral, Daily, Karna Christmas, Pacer Dorn, MD, 12.5 mg at 07/28/22 0942   torsemide (DEMADEX) tablet 10 mg, 10 mg, Oral, Daily, Karna Christmas, Aftin Lye, MD, 10 mg at 07/28/22 0947    ALLERGIES   Morphine and  related    REVIEW OF SYSTEMS     10 point review of systems is positive for dyspnea, hand and leg weakness which she states has been progressively getting worse over the last year.  PHYSICAL EXAMINATION   Vital Signs: Temp:  [97.7 F (36.5 C)-98.5 F (36.9 C)] 98.2 F (36.8 C) (04/19 0751) Pulse Rate:  [62-96] 71 (04/19 0800) Resp:  [18-37] 28 (04/19 0800) BP: (94-135)/(48-81) 95/62 (04/19 0800) SpO2:  [98 %-100 %] 100 % (04/19 0800) FiO2 (%):  [40 %] 40 % (04/19 0419)  GENERAL: Mild distress due to acutely ill status HEAD: Normocephalic, atraumatic.  EYES: Pupils equal, round, reactive to light.  No scleral icterus.  MOUTH: Moist mucosal membrane. NECK: Supple. No thyromegaly. No nodules. No JVD.  PULMONARY: Crepitations and rhonchi bilaterally CARDIOVASCULAR: S1 and S2. Regular rate and rhythm. No murmurs, rubs, or gallops.  GASTROINTESTINAL: Soft, nontender, non-distended. No masses. Positive bowel sounds. No hepatosplenomegaly.  MUSCULOSKELETAL: No swelling, clubbing, or edema.  NEUROLOGIC: Mild distress due to acute illness SKIN:intact,warm,dry   PERTINENT DATA     Infusions:  Scheduled Medications:  amiodarone  200 mg Oral Daily   apixaban  2.5 mg Oral BID   aspirin EC  81 mg Oral Daily   Chlorhexidine Gluconate Cloth  6 each Topical Q0600   dexamethasone  2 mg Oral Daily   digoxin  0.0625 mg Oral Daily   feeding supplement  237 mL Oral BID BM   gabapentin  100 mg Oral TID   insulin aspart  0-5 Units Subcutaneous QHS   insulin aspart  0-9 Units Subcutaneous TID WC   mycophenolate  250 mg Oral BID   spironolactone  12.5 mg Oral Daily   torsemide  10 mg Oral Daily   PRN Medications: acetaminophen **OR** acetaminophen, ALPRAZolam, ondansetron **OR** ondansetron (ZOFRAN) IV, senna-docusate Hemodynamic parameters:   Intake/Output: 04/18 0701 - 04/19 0700 In: 560 [P.O.:560] Out: 1100 [Urine:1100]  Ventilator  Settings: FiO2 (%):  [40 %] 40 %   LAB  RESULTS:  Basic Metabolic Panel: Recent Labs  Lab 07/26/22 1341 07/27/22 0416  NA 138 141  K 4.3 5.0  CL 95* 100  CO2 34* 34*  GLUCOSE 138* 135*  BUN 22 26*  CREATININE 0.79 0.96  CALCIUM  8.9 8.9    Liver Function Tests: No results for input(s): "AST", "ALT", "ALKPHOS", "BILITOT", "PROT", "ALBUMIN" in the last 168 hours. No results for input(s): "LIPASE", "AMYLASE" in the last 168 hours. No results for input(s): "AMMONIA" in the last 168 hours. CBC: Recent Labs  Lab 07/26/22 1341 07/27/22 0416  WBC 11.6* 8.2  HGB 10.3* 9.3*  HCT 35.3* 31.8*  MCV 98.6 98.1  PLT 226 215    Cardiac Enzymes: No results for input(s): "CKTOTAL", "CKMB", "CKMBINDEX", "TROPONINI" in the last 168 hours. BNP: Invalid input(s): "POCBNP" CBG: Recent Labs  Lab 07/27/22 1136 07/27/22 1611 07/27/22 1747 07/27/22 2125 07/28/22 0733  GLUCAP 172* 66* 175* 97 135*        IMAGING RESULTS:  Imaging: CT Angio Chest Pulmonary Embolism (PE) W or WO Contrast  Result Date: 07/26/2022 CLINICAL DATA:  Chronic respiratory failure with hypoxia. PE suspected. EXAM: CT ANGIOGRAPHY CHEST WITH CONTRAST TECHNIQUE: Multidetector CT imaging of the chest was performed using the standard protocol during bolus administration of intravenous contrast. Multiplanar CT image reconstructions and MIPs were obtained to evaluate the vascular anatomy. RADIATION DOSE REDUCTION: This exam was performed according to the departmental dose-optimization program which includes automated exposure control, adjustment of the mA and/or kV according to patient size and/or use of iterative reconstruction technique. CONTRAST:  75mL OMNIPAQUE IOHEXOL 350 MG/ML SOLN COMPARISON:  Chest radiographs 07/26/2022 and CTA chest 01/20/2022 FINDINGS: Cardiovascular: Satisfactory opacification of the pulmonary arteries to the segmental level. No acute pulmonary embolism. Cardiomegaly. Trace pericardial effusion. Coronary artery and aortic  atherosclerotic calcification. Mediastinum/Nodes: Moderate hiatal hernia. Esophagus is otherwise unremarkable. Similar mediastinal adenopathy. For example a pretracheal node again measures 14 mm in short axis (series 4/image 37). Lungs/Pleura: Diffuse bilateral pulmonary interstitial lung disease with central ground-glass opacity, peripheral reticular opacities and honeycombing. Architectural distortion and bronchiectasis/bronchiolectasis greatest in the lower lungs. Mildly increased ground-glass opacities in the left upper lobe compared to 01/20/2022 may be due to infection or edema. Wispy secretions in the trachea. Upper Abdomen: No acute abnormality. Musculoskeletal: Thoracic spondylosis. No acute fracture or destructive osseous lesion. Review of the MIP images confirms the above findings. IMPRESSION: 1. No acute pulmonary embolism. 2. Chronic interstitial lung disease. New ground-glass opacities in the left upper lobe may be due to edema or infection. 3. Cardiomegaly and coronary artery calcification. 4. Moderate hiatal hernia. Aortic Atherosclerosis (ICD10-I70.0). Electronically Signed   By: Minerva Fester M.D.   On: 07/26/2022 20:19   DG Chest 2 View  Result Date: 07/26/2022 CLINICAL DATA:  SOB EXAM: CHEST - 2 VIEW COMPARISON:  01/19/2022 FINDINGS: Enlarged cardiac silhouette. Extensive pulmonary interstitial fibrosis. No pneumothorax or pleural effusion identified. No focal alveolar consolidation. Osseous structures appear grossly intact. IMPRESSION: Enlarged cardiac silhouette.  Extensive pulmonary fibrosis. Electronically Signed   By: Layla Maw M.D.   On: 07/26/2022 14:26   @PROBHOSP @ No results found.   ASSESSMENT AND PLAN    -Multidisciplinary rounds held today  Acute on chronic hypoxic Respiratory Failure -Due to mild pulmonary edema likely related to CHF -Have given IV Lasix x 1 and started her on Aldactone and torsemide combination p.o., most recent renal function from today is  at reference range -Reviewed CT PE protocol there is no pulmonary embolism, no consolidated infiltrate however mild left upper lung zone groundglass infiltrate noted which may be due to edema. -Supportive care with incentive spirometry as well as flutter valve.  Will initiate PT OT and out of bed to chair today -Continue dexamethasone 2 mg  p.o. daily and CellCept 500 twice daily -There are no signs of active infection and I have DC'd Rocephin and Zithromax to avoid any unnecessary adverse effects such as diarrhea and skin rash  Chronic atrial fibrillation -Eliquis 2.5 mg twice daily, digoxin 0.65 mcg, amiodarone 200 p.o. daily -Patient does not tolerate beta-blockers -follow up cardiac enzymes as indicated ICU monitoring  Severe anxiety disorder -Patient has had a prescription of benzodiazepines before however becomes severely sedated and reports feelings of presyncope    GI/Nutrition GI PROPHYLAXIS as indicated DIET-->TF's as tolerated Constipation protocol as indicated  ENDO - ICU hypoglycemic\Hyperglycemia protocol -check FSBS per protocol   ELECTROLYTES -follow labs as needed -replace as needed -pharmacy consultation   DVT/GI PRX ordered -SCDs  TRANSFUSIONS AS NEEDED MONITOR FSBS ASSESS the need for LABS as needed    Vida Rigger, M.D.  Pulmonary & Critical Care Medicine      This document was prepared using Dragon voice recognition software and may include unintentional dictation errors.

## 2022-07-28 NOTE — Progress Notes (Signed)
OT Cancellation Note  Patient Details Name: LATRESSA HARRIES MRN: [id number] DOB: 03-24-1941   Cancelled Treatment:    Reason Eval/Treat Not Completed: Other (comment). Pt politely declined OT intervention and reports not feeling well and not sleeping last night. Pt requesting to rest at this time.   Jackquline Denmark, MS, OTR/L , CBIS ascom [phone]  07/28/22, 12:24 PM

## 2022-07-29 DIAGNOSIS — J9621 Acute and chronic respiratory failure with hypoxia: Secondary | ICD-10-CM | POA: Diagnosis not present

## 2022-07-29 LAB — BASIC METABOLIC PANEL
Anion gap: 8 (ref 5–15)
BUN: 28 mg/dL — ABNORMAL HIGH (ref 8–23)
CO2: 39 mmol/L — ABNORMAL HIGH (ref 22–32)
Calcium: 9.4 mg/dL (ref 8.9–10.3)
Chloride: 95 mmol/L — ABNORMAL LOW (ref 98–111)
Creatinine, Ser: 0.85 mg/dL (ref 0.44–1.00)
GFR, Estimated: >60 mL/min (ref >60)
Glucose, Bld: 114 mg/dL — ABNORMAL HIGH (ref 70–99)
Potassium: 3.7 mmol/L (ref 3.5–5.1)
Sodium: 142 mmol/L (ref 135–145)

## 2022-07-29 LAB — GLUCOSE, CAPILLARY
Glucose-Capillary: 139 mg/dL — ABNORMAL HIGH (ref 70–99)
Glucose-Capillary: 312 mg/dL — ABNORMAL HIGH (ref 70–99)
Glucose-Capillary: 83 mg/dL (ref 70–99)
Glucose-Capillary: 104 mg/dL — ABNORMAL HIGH (ref 70–99)

## 2022-07-29 LAB — CULTURE, BLOOD (ROUTINE X 2): Culture: NO GROWTH

## 2022-07-29 NOTE — [place] FL2 (Signed)
[place] MEDICAID FL2 LEVEL OF CARE FORM     IDENTIFICATION  Patient Name: Sierra Guzman Birthdate: Oct 20, 1940 Sex: female Admission Date (Current Location): 07/26/2022  [facility] and [place] Number:  Chiropodist and Address:  [facility], [address], [place], [place] [address]      Provider Number: [id number]  Attending Physician Name and Address:  Sunnie Nielsen, DO  Relative Name and Phone Number:  Dorma Russell (Daughter) [phone] [facility] Phone)    Current Level of Care: Hospital Recommended Level of Care: Skilled Nursing Facility Prior Approval Number:  [id number] Change in condition review 4/8 to 08/16/2022)  Date Approved/Denied:   PASRR Number: #[id number] (07/17/22 to 08/16/22)  Discharge Plan: SNF    Current Diagnoses: Patient Active Problem List   Diagnosis Date Noted   Acute hypoxemic respiratory failure 07/26/2022   SIRS (systemic inflammatory response syndrome) 07/26/2022   Dysuria 07/26/2022   Anxiety 07/26/2022   Periprosthetic hip fracture, initial encounter 09/14/2021   Malnutrition of moderate degree 06/15/2021   Multifocal atrial tachycardia    HFrEF (heart failure with reduced ejection fraction)    Elevated liver function tests 06/12/2021   Hypomagnesemia 06/11/2021   Hypotension 06/11/2021   Interstitial lung disease 06/11/2021   Acute on chronic combined systolic and diastolic CHF (congestive heart failure) 06/10/2021   Influenza A with pneumonia 03/07/2021   Acute on chronic respiratory failure with hypoxemia 03/07/2021   Pressure injury of skin 01/13/2021   Chronic kidney disease, stage 3a 01/01/2021   Chronic diastolic CHF (congestive heart failure) 12/27/2020   AKI (acute kidney injury) 12/27/2020   IPF (idiopathic pulmonary fibrosis) 12/27/2020   Sacral decubitus ulcer 12/27/2020   Occult blood positive stool 12/27/2020   DDD (degenerative disc disease), cervical 12/21/2020   Hereditary and  idiopathic peripheral neuropathy 12/21/2020   Localized, primary osteoarthritis 12/21/2020   Low back pain 12/21/2020   Pain in limb 12/21/2020   Chronic respiratory failure with hypoxia 12/05/2020   Acute on chronic respiratory failure with hypoxia 12/05/2020   Hip pain 08/06/2020   Spinal stenosis in cervical region 08/06/2020   Spondylolisthesis, congenital 08/06/2020   Status post total hip replacement, 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   Primary localized osteoarthritis of right hip 12/12/2016   B12 deficiency 10/28/2010   Coronary artery disease 10/28/2010   Uncontrolled type 2 diabetes mellitus with hyperglycemia, without long-term current use of insulin 10/28/2010   Gout 10/28/2010   Hyperlipidemia with target LDL less than 100 10/28/2010   Hypertension 10/28/2010   Osteoarthritis of left knee 10/28/2010   Anemia of chronic disease 10/28/2010   History of colonic polyps 10/28/2010    Orientation RESPIRATION BLADDER Height & Weight     Self, Time, Situation, Place  O2 (4L/[place] as of 07/29/2022) Incontinent Weight: 50.4 kg Height:   (170.2 cm)  BEHAVIORAL SYMPTOMS/MOOD NEUROLOGICAL BOWEL NUTRITION STATUS      Incontinent Diet  AMBULATORY STATUS COMMUNICATION OF NEEDS Skin   Extensive Assist Verbally PU Stage and Appropriate Care (coccyx present on admission)   PU Stage 2 Dressing:  (Every 3 days)                   Personal Care Assistance Level of Assistance  Bathing, Feeding, Dressing Bathing Assistance: Maximum assistance Feeding assistance: Limited assistance Dressing Assistance: Maximum assistance     Functional Limitations Info             SPECIAL CARE FACTORS FREQUENCY  PT (By licensed PT), OT (By licensed OT)  PT Frequency: 5x/week OT Frequency: 5x/week            Contractures      Additional Factors Info  Code Status, Allergies Code Status Info: DNR Allergies Info: : Morphine And Related           Current Medications (07/29/2022):  This is the  current hospital active medication list Current Facility-Administered Medications  Medication Dose Route Frequency Provider Last Rate Last Admin   acetaminophen (TYLENOL) tablet 650 mg  650 mg Oral Q6H PRN Cox, Amy N, DO   650 mg at 07/29/22 1304   Or   acetaminophen (TYLENOL) suppository 650 mg  650 mg Rectal Q6H PRN Cox, Amy N, DO       ALPRAZolam Prudy Feeler) tablet 0.25 mg  0.25 mg Oral TID PRN Agbata, Tochukwu, MD   0.25 mg at 07/29/22 1228   amiodarone (PACERONE) tablet 200 mg  200 mg Oral Daily Vida Rigger, MD   200 mg at 07/29/22 0959   apixaban (ELIQUIS) tablet 2.5 mg  2.5 mg Oral BID Vida Rigger, MD   2.5 mg at 07/29/22 1000   cholecalciferol (VITAMIN D3) 25 MCG (1000 UNIT) tablet 1,000 Units  1,000 Units Oral Daily Sunnie Nielsen, DO   1,000 Units at 07/29/22 1000   dexamethasone (DECADRON) tablet 2 mg  2 mg Oral Daily Vida Rigger, MD   2 mg at 07/29/22 1001   digoxin (LANOXIN) tablet 0.0625 mg  0.0625 mg Oral Daily Vida Rigger, MD   0.0625 mg at 07/29/22 1002   feeding supplement (ENSURE ENLIVE / ENSURE PLUS) liquid 237 mL  237 mL Oral BID BM Agbata, Tochukwu, MD   237 mL at 07/29/22 1545   gabapentin (NEURONTIN) capsule 100 mg  100 mg Oral TID Cox, Amy N, DO   100 mg at 07/29/22 0959   insulin aspart (novoLOG) injection 0-5 Units  0-5 Units Subcutaneous QHS Cox, Amy N, DO       insulin aspart (novoLOG) injection 0-9 Units  0-9 Units Subcutaneous TID WC Cox, Amy N, DO   7 Units at 07/29/22 1304   loratadine (CLARITIN) tablet 10 mg  10 mg Oral Daily Sunnie Nielsen, DO   10 mg at 07/29/22 1000   magnesium oxide (MAG-OX) tablet 400 mg  400 mg Oral Daily Sunnie Nielsen, DO   400 mg at 07/29/22 1610   mirtazapine (REMERON) tablet 30 mg  30 mg Oral QHS Sunnie Nielsen, DO   30 mg at 07/28/22 2130   mycophenolate (CELLCEPT) capsule 250 mg  250 mg Oral BID Cox, Amy N, DO   250 mg at 07/29/22 1002   ondansetron (ZOFRAN) tablet 4 mg  4 mg Oral Q6H PRN Cox, Amy N, DO        Or   ondansetron (ZOFRAN) injection 4 mg  4 mg Intravenous Q6H PRN Cox, Amy N, DO       pantoprazole (PROTONIX) EC tablet 40 mg  40 mg Oral Daily Sunnie Nielsen, DO   40 mg at 07/29/22 1000   senna-docusate (Senokot-S) tablet 1 tablet  1 tablet Oral QHS PRN Cox, Amy N, DO   1 tablet at 07/27/22 1044   sertraline (ZOLOFT) tablet 100 mg  100 mg Oral Daily Sunnie Nielsen, DO   100 mg at 07/29/22 1000   spironolactone (ALDACTONE) tablet 12.5 mg  12.5 mg Oral Daily Vida Rigger, MD   12.5 mg at 07/29/22 1002   torsemide (DEMADEX) tablet 10 mg  10 mg Oral Daily Aleskerov, Fuad,  MD   10 mg at 07/29/22 1000     Discharge Medications: Please see discharge summary for a list of discharge medications.  Relevant Imaging Results:  Relevant Lab Results:   Additional Information SSN: [id number]  Bing Quarry, RN

## 2022-07-29 NOTE — TOC Progression Note (Addendum)
Transition of Care [facility]) - Progression Note    Patient Details  Name: Sierra Guzman MRN: [id number] Date of Birth: [birth year]  [facility] CM/SW Contact  Bing Quarry, RN Phone Number: 07/29/2022, 10:41 AM  Clinical Narrative:  07/29/2022: 1035 Left VM with new weekend [facility] and called main number and was transferred to the nursing station with a circle of extensions at #18 or #19  with no answer at either nor option to leave a VM.  RN CM also sent secure text to the [facility] in hopes of a return call or answer as to whether patient can go over the weekend or Monday. Gabriel Cirri RN CM [phone] 1050 am. 07/29/22.   Unable to take admissions over the weekend per [facility]. Gabriel Cirri RN CM       Expected Discharge Plan and Services                                               Social Determinants of Health (SDOH) Interventions SDOH Screenings   Food Insecurity: No Food Insecurity (01/20/2022)  Housing: Low Risk  (01/20/2022)  Transportation Needs: No Transportation Needs (01/20/2022)  Utilities: Not At Risk (01/20/2022)  Depression (PHQ2-9): Low Risk  (09/14/2020)  Recent Concern: Depression (PHQ2-9) - Medium Risk (08/10/2020)  Tobacco Use: Medium Risk (07/26/2022)    Readmission Risk Interventions    06/13/2021    1:06 PM 03/09/2021   12:37 PM  Readmission Risk Prevention Plan  Transportation Screening Complete Complete  PCP or Specialist Appt within 3-5 Days  Complete  HRI or Home Care Consult  Complete  Social Work Consult for Recovery Care Planning/Counseling  Complete  Palliative Care Screening  Not Applicable  Medication Review Oceanographer) Complete Complete  PCP or Specialist appointment within 3-5 days of discharge Complete   HRI or Home Care Consult Complete   SW Recovery Care/Counseling Consult Complete   Palliative Care Screening Not Applicable   Skilled Nursing Facility Patient Refused

## 2022-07-29 NOTE — Progress Notes (Signed)
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  She was recently seen at [facility] and was discharged to [facility] and was noted to have worsening hypoxemia subsequently brought to this hospital.  While at [facility] she was requiring 8 L nasal cannula and was saturating only 83% on the settings.  While in the intensive care unit she is slowly improved with mild tachypnea and now weaned down to 6 L of oxygen via nasal cannula.  She received Solu-Medrol on arrival and is also being treated empirically for pneumonia.  She had CT PE performed on arrival with findings of interstitial and airspace opacification with bronchiectasis bilaterally and pulmonary fibrosis.  07/28/22- patient is on 4L/min [place], she reports having good day get out of bed working wit PT.  Her Atrial fibrillation is stable. She is close to baseline in chronically ill state.  She is asking about going home.  She can continue dexamethasone and cellcept at current dose with bactrim SS TIW for PCP ppx.  She should also continue PT/OT upon dc.   07/29/22- patient resting in bed.  Appears to be at baseline clinically.  She's working with PT/OT.  She came from [place] and is being optimized for dc home. I spoke with daughter [name] on phone today.   Lines/tubes : External Urinary Catheter (Active)      External Urinary Catheter (Active)  Collection Container Dedicated Suction Canister 07/27/22 0400  Suction (Verified suction is between 40-80 mmHg) Yes 07/26/22 2300  Securement Method Securing device (Describe) 07/27/22 0400  Site Assessment Clean;Dry 07/27/22 0400    Microbiology/Sepsis markers: Results for orders placed or performed during the hospital encounter of 07/26/22  Resp panel by RT-PCR (RSV, Flu A&B, Covid) Anterior Nasal Swab     Status: None   Collection Time: 07/26/22  3:52 PM   Specimen: Anterior Nasal Swab  Result Value Ref Range Status   SARS Coronavirus 2 by RT PCR NEGATIVE NEGATIVE Final    Comment: (NOTE) SARS-CoV-2 target nucleic acids are NOT DETECTED.  The SARS-CoV-2 RNA is generally  detectable in upper respiratory specimens during the acute phase of infection. The lowest concentration of SARS-CoV-2 viral copies this assay can detect is 138 copies/mL. A negative result does not preclude SARS-Cov-2 infection and should not be used as the sole basis for treatment or other patient management decisions. A negative result may occur with  improper specimen collection/handling, submission of specimen other than nasopharyngeal swab, presence of viral mutation(s) within the areas targeted by this assay, and inadequate number of viral copies(<138 copies/mL). A negative result must be combined with clinical observations, patient history, and epidemiological information. The expected result is Negative.  Fact Sheet for Patients:  BloggerCourse.com  Fact Sheet for Healthcare Providers:  SeriousBroker.it  This test is no t yet approved or cleared by the [place] FDA and  has been authorized for detection and/or diagnosis of SARS-CoV-2 by FDA under an Emergency Use Authorization (EUA). This EUA will remain  in effect (meaning this test can be used) for the duration of the COVID-19 declaration under Section  564(b)(1) of the Act, 21 U.S.C.section 360bbb-3(b)(1), unless the authorization is terminated  or revoked sooner.       Influenza A by PCR NEGATIVE NEGATIVE Final   Influenza B by PCR NEGATIVE NEGATIVE Final    Comment: (NOTE) The Xpert Xpress SARS-CoV-2/FLU/RSV plus assay is intended as an aid in the diagnosis of influenza from Nasopharyngeal swab specimens and should not be used as a sole basis for treatment. Nasal washings and aspirates are unacceptable for Xpert Xpress SARS-CoV-2/FLU/RSV testing.  Fact Sheet for Patients: BloggerCourse.com  Fact Sheet for Healthcare Providers: SeriousBroker.it  This test is not yet approved or cleared by the [place] FDA and has been authorized for detection and/or diagnosis of SARS-CoV-2 by FDA under an Emergency Use Authorization (EUA). This EUA will remain in effect (meaning this test can be used) for the duration of the COVID-19 declaration under Section 564(b)(1) of the Act, 21 U.S.C. section 360bbb-3(b)(1), unless the authorization is terminated or revoked.     Resp Syncytial Virus by PCR NEGATIVE NEGATIVE Final    Comment: (NOTE) Fact Sheet for Patients: BloggerCourse.com  Fact Sheet for Healthcare Providers: SeriousBroker.it  This test is not yet approved or cleared by the [place] FDA and has been authorized for detection and/or diagnosis of SARS-CoV-2 by FDA under an Emergency Use Authorization (EUA). This EUA will remain in effect (meaning this test can be used) for the duration of the COVID-19 declaration under Section 564(b)(1) of the Act, 21 U.S.C. section 360bbb-3(b)(1), unless the authorization is terminated or revoked.  Performed at [facility], [address] [place], [place] [address]   Culture, blood (Routine X 2) w Reflex to ID Panel     Status: None (Preliminary result)   Collection Time:  07/26/22  5:45 PM   Specimen: BLOOD  Result Value Ref Range Status   Specimen Description BLOOD RIGHT ANTECUBITAL  Final   Special Requests   Final    BOTTLES DRAWN AEROBIC AND ANAEROBIC Blood Culture adequate volume   Culture   Final    NO GROWTH 3 DAYS Performed at [facility], [address] [place], [place] [address]    Report Status PENDING  Incomplete  Culture, blood (Routine X 2) w Reflex to ID Panel     Status: None (Preliminary result)   Collection Time: 07/26/22  5:45 PM   Specimen: BLOOD  Result Value Ref Range Status   Specimen Description BLOOD LEFT ANTECUBITAL  Final   Special  Requests   Final    BOTTLES DRAWN AEROBIC AND ANAEROBIC Blood Culture adequate volume   Culture   Final    NO GROWTH 3 DAYS Performed at [facility], [address] [place], [place] [address]    Report Status PENDING  Incomplete  MRSA Next Gen by PCR, Nasal     Status: None   Collection Time: 07/26/22 10:40 PM   Specimen: Nasal Mucosa; Nasal Swab  Result Value Ref Range Status   MRSA by PCR Next Gen NOT DETECTED NOT DETECTED Final    Comment: (NOTE) The GeneXpert MRSA Assay (FDA approved for NASAL specimens only), is one component of a comprehensive MRSA colonization surveillance program. It is not intended to diagnose MRSA infection nor to guide or monitor treatment for MRSA infections. Test performance is not FDA approved in patients less than 41 years old. Performed at [facility], [address] [place], [place] [address]     Anti-infectives:  Anti-infectives (From admission, onward)    Start     Dose/Rate Route Frequency Ordered Stop   07/27/22 1800  cefTRIAXone (ROCEPHIN) 1 g in sodium chloride 0.9 % 100 mL IVPB  Status:  Discontinued        1 g 200 mL/hr over 30 Minutes Intravenous Every 24 hours 07/26/22 1955 07/27/22 0901   07/26/22 2045  azithromycin (ZITHROMAX) 500 mg in sodium chloride 0.9 % 250 mL IVPB  Status:  Discontinued        500  mg 250 mL/hr over 60 Minutes Intravenous Every 24 hours 07/26/22 2031 07/27/22 0901   07/26/22 2000  cefTRIAXone (ROCEPHIN) 1 g in sodium chloride 0.9 % 100 mL IVPB  Status:  Discontinued        1 g 200 mL/hr over 30 Minutes Intravenous Every 24 hours 07/26/22 1947 07/26/22 1955   07/26/22 1730  cefTRIAXone (ROCEPHIN) 2 g in sodium chloride 0.9 % 100 mL IVPB  Status:  Discontinued        2 g 200 mL/hr over 30 Minutes Intravenous Every 24 hours 07/26/22 1722 07/26/22 1908   07/26/22 1730  azithromycin (ZITHROMAX) 500 mg in sodium chloride 0.9 % 250 mL IVPB  Status:  Discontinued        500 mg 250 mL/hr over 60 Minutes Intravenous Every 24 hours 07/26/22 1722 07/26/22 1908   07/26/22 1715  azithromycin (ZITHROMAX) 500 mg in sodium chloride 0.9 % 250 mL IVPB  Status:  Discontinued        500 mg 250 mL/hr over 60 Minutes Intravenous  Once 07/26/22 1705 07/26/22 1722   07/26/22 1715  cefTRIAXone (ROCEPHIN) 1 g in sodium chloride 0.9 % 100 mL IVPB  Status:  Discontinued        1 g 200 mL/hr over 30 Minutes Intravenous  Once 07/26/22 1705 07/26/22 1722        Consults: Treatment Team:  Vida Rigger, MD    PAST MEDICAL HISTORY   Past Medical History:  Diagnosis Date   Anemia 10/28/2010   unspecified   Anxiety    Aortic valve sclerosis    Arthritis    Asthma    Chronic combined systolic and diastolic CHF (congestive heart failure)    echo- 10/22 LVEF 45-55%, G1DD, moderately reduced RV fxn, mild aortic regurg/stenosis   Chronic kidney disease    Chronic Kidney Disease---Stage III   Coronary artery disease 10/28/2010   DDD (degenerative disc disease), lumbar 2006   Diabetes type 2, controlled 10/28/2010   Dyspnea  GERD (gastroesophageal reflux disease)    Gout 10/28/2010   Heart murmur    History of cardiac catheterization 2006   LAD 50%, D1 50% rest normal.    History of cardiovascular stress test 09/23/2009    lexiscan Normal Cardiolite test, EF 61%. LV size and  function - normal   History of echocardiogram 05/27/2008   EF 55%; Mild aortic insufficiency with minimal aortic sclerosis. L vent normal.    Hyperlipidemia    Hyperlipidemia LDL goal <100 10/28/2010   managed by Dr. Burr Medico   Hypertension 10/28/2010   IPF (idiopathic pulmonary fibrosis)    Progressive, with UIP   Mild vitamin D deficiency 02/22/2011   Obesity (BMI 30-39.9) 10/28/2010   unspecified   Osteoarthritis 10/28/2010   bilateral knees, left hip   Personal history of colonic polyps 10/28/2010   Shortness of breath    Syncope 2013   Vitamin B 12 deficiency 10/28/2010     SURGICAL HISTORY   Past Surgical History:  Procedure Laterality Date   BACK SURGERY  2006   DDD   BREAST BIOPSY Right    neg   CARDIAC CATHETERIZATION  2006   EYE SURGERY Bilateral    Cataract Extraction with IOL   JOINT REPLACEMENT Left    Left Total Hip Replacement   JOINT REPLACEMENT     RIGHT 2005; LEFT 2 X 2004, 2011   REPLACEMENT TOTAL KNEE Left    REVISION TOTAL KNEE ARTHROPLASTY Left    Dr. Ernest Pine   TONSILLECTOMY     TOTAL HIP ARTHROPLASTY Right 12/12/2016   Procedure: TOTAL HIP ARTHROPLASTY ANTERIOR APPROACH;  Surgeon: Kennedy Bucker, MD;  Location: [facility] ORS;  Service: Orthopedics;  Laterality: Right;   TOTAL HIP ARTHROPLASTY Left 12/26/2013   Procedure: ARTHROPLASTY HIP TOTAL; Surgeon: Christell Faith, MD; Location: [facility] OR; Service: Orthopedics; Laterality: left,    TOTAL KNEE ARTHROPLASTY     both knees     FAMILY HISTORY   Family History  Problem Relation Age of Onset   Breast cancer Mother 30   Diabetes Mother    Stroke Mother    Stroke Brother    Breast cancer Cousin        pat cousin     SOCIAL HISTORY   Social History   Tobacco Use   Smoking status: Former    Packs/day: 0.25    Years: 10.00    Additional pack years: 0.00    Total pack years: 2.50    Types: Cigarettes    Quit date: 08/18/1990    Years since quitting: 31.9   Smokeless tobacco: Never   Vaping Use   Vaping Use: Never used  Substance Use Topics   Alcohol use: No   Drug use: No     MEDICATIONS   Current Medication:  Current Facility-Administered Medications:    acetaminophen (TYLENOL) tablet 650 mg, 650 mg, Oral, Q6H PRN **OR** acetaminophen (TYLENOL) suppository 650 mg, 650 mg, Rectal, Q6H PRN, Cox, Amy N, DO   ALPRAZolam (XANAX) tablet 0.25 mg, 0.25 mg, Oral, TID PRN, Agbata, Tochukwu, MD, 0.25 mg at 07/28/22 2017   amiodarone (PACERONE) tablet 200 mg, 200 mg, Oral, Daily, Selby Foisy, MD, 200 mg at 07/28/22 0942   apixaban (ELIQUIS) tablet 2.5 mg, 2.5 mg, Oral, BID, Karna Christmas, Wafaa Deemer, MD, 2.5 mg at 07/28/22 2130   cholecalciferol (VITAMIN D3) 25 MCG (1000 UNIT) tablet 1,000 Units, 1,000 Units, Oral, Daily, Sunnie Nielsen, DO, 1,000 Units at 07/28/22 1409   dexamethasone (DECADRON) tablet 2 mg, 2  mg, Oral, Daily, Vida Rigger, MD, 2 mg at 07/28/22 0943   digoxin (LANOXIN) tablet 0.0625 mg, 0.0625 mg, Oral, Daily, Karna Christmas, Tramane Gorum, MD, 0.0625 mg at 07/28/22 0942   feeding supplement (ENSURE ENLIVE / ENSURE PLUS) liquid 237 mL, 237 mL, Oral, BID BM, Agbata, Tochukwu, MD, 237 mL at 07/28/22 1409   gabapentin (NEURONTIN) capsule 100 mg, 100 mg, Oral, TID, Cox, Amy N, DO, 100 mg at 07/28/22 2129   insulin aspart (novoLOG) injection 0-5 Units, 0-5 Units, Subcutaneous, QHS, Cox, Amy N, DO   insulin aspart (novoLOG) injection 0-9 Units, 0-9 Units, Subcutaneous, TID WC, Cox, Amy N, DO, 3 Units at 07/28/22 1213   loratadine (CLARITIN) tablet 10 mg, 10 mg, Oral, Daily, Sunnie Nielsen, DO, 10 mg at 07/28/22 1409   magnesium oxide (MAG-OX) tablet 400 mg, 400 mg, Oral, Daily, Sunnie Nielsen, DO, 400 mg at 07/28/22 1409   mirtazapine (REMERON) tablet 30 mg, 30 mg, Oral, QHS, Sunnie Nielsen, DO, 30 mg at 07/28/22 2130   mycophenolate (CELLCEPT) capsule 250 mg, 250 mg, Oral, BID, Cox, Amy N, DO, 250 mg at 07/28/22 2129   ondansetron (ZOFRAN) tablet 4 mg, 4 mg, Oral,  Q6H PRN **OR** ondansetron (ZOFRAN) injection 4 mg, 4 mg, Intravenous, Q6H PRN, Cox, Amy N, DO   pantoprazole (PROTONIX) EC tablet 40 mg, 40 mg, Oral, Daily, Sunnie Nielsen, DO, 40 mg at 07/28/22 1409   senna-docusate (Senokot-S) tablet 1 tablet, 1 tablet, Oral, QHS PRN, Cox, Amy N, DO, 1 tablet at 07/27/22 1044   sertraline (ZOLOFT) tablet 100 mg, 100 mg, Oral, Daily, Sunnie Nielsen, DO, 100 mg at 07/28/22 1409   spironolactone (ALDACTONE) tablet 12.5 mg, 12.5 mg, Oral, Daily, Karna Christmas, Dortha Neighbors, MD, 12.5 mg at 07/28/22 0942   torsemide (DEMADEX) tablet 10 mg, 10 mg, Oral, Daily, Karna Christmas, Aidden Markovic, MD, 10 mg at 07/28/22 0947    ALLERGIES   Morphine and related    REVIEW OF SYSTEMS     10 point review of systems is positive for dyspnea, hand and leg weakness which she states has been progressively getting worse over the last year.  PHYSICAL EXAMINATION   Vital Signs: Temp:  [97.7 F (36.5 C)-98.2 F (36.8 C)] 97.7 F (36.5 C) (04/20 0416) Pulse Rate:  [71-98] 72 (04/20 0416) Resp:  [18-38] 18 (04/20 0416) BP: (104-126)/(54-75) 126/75 (04/20 0416) SpO2:  [97 %-100 %] 100 % (04/20 0416)  GENERAL: Mild distress due to acutely ill status HEAD: Normocephalic, atraumatic.  EYES: Pupils equal, round, reactive to light.  No scleral icterus.  MOUTH: Moist mucosal membrane. NECK: Supple. No thyromegaly. No nodules. No JVD.  PULMONARY: Crepitations and rhonchi bilaterally CARDIOVASCULAR: S1 and S2. Regular rate and rhythm. No murmurs, rubs, or gallops.  GASTROINTESTINAL: Soft, nontender, non-distended. No masses. Positive bowel sounds. No hepatosplenomegaly.  MUSCULOSKELETAL: No swelling, clubbing, or edema.  NEUROLOGIC: Mild distress due to acute illness SKIN:intact,warm,dry   PERTINENT DATA     Infusions:  Scheduled Medications:  amiodarone  200 mg Oral Daily   apixaban  2.5 mg Oral BID   cholecalciferol  1,000 Units Oral Daily   dexamethasone  2 mg Oral Daily    digoxin  0.0625 mg Oral Daily   feeding supplement  237 mL Oral BID BM   gabapentin  100 mg Oral TID   insulin aspart  0-5 Units Subcutaneous QHS   insulin aspart  0-9 Units Subcutaneous TID WC   loratadine  10 mg Oral Daily   magnesium oxide  400 mg Oral Daily  mirtazapine  30 mg Oral QHS   mycophenolate  250 mg Oral BID   pantoprazole  40 mg Oral Daily   sertraline  100 mg Oral Daily   spironolactone  12.5 mg Oral Daily   torsemide  10 mg Oral Daily   PRN Medications: acetaminophen **OR** acetaminophen, ALPRAZolam, ondansetron **OR** ondansetron (ZOFRAN) IV, senna-docusate Hemodynamic parameters:   Intake/Output: 04/19 0701 - 04/20 0700 In: 1080 [P.O.:1080] Out: 1200 [Urine:1200]  Ventilator  Settings:     LAB RESULTS:  Basic Metabolic Panel: Recent Labs  Lab 07/26/22 1341 07/27/22 0416 07/29/22 0543  NA 138 141 142  K 4.3 5.0 3.7  CL 95* 100 95*  CO2 34* 34* 39*  GLUCOSE 138* 135* 114*  BUN 22 26* 28*  CREATININE 0.79 0.96 0.85  CALCIUM 8.9 8.9 9.4    Liver Function Tests: No results for input(s): "AST", "ALT", "ALKPHOS", "BILITOT", "PROT", "ALBUMIN" in the last 168 hours. No results for input(s): "LIPASE", "AMYLASE" in the last 168 hours. No results for input(s): "AMMONIA" in the last 168 hours. CBC: Recent Labs  Lab 07/26/22 1341 07/27/22 0416  WBC 11.6* 8.2  HGB 10.3* 9.3*  HCT 35.3* 31.8*  MCV 98.6 98.1  PLT 226 215    Cardiac Enzymes: No results for input(s): "CKTOTAL", "CKMB", "CKMBINDEX", "TROPONINI" in the last 168 hours. BNP: Invalid input(s): "POCBNP" CBG: Recent Labs  Lab 07/27/22 2125 07/28/22 0733 07/28/22 1131 07/28/22 1630 07/28/22 2114  GLUCAP 97 135* 207* 102* 198*        IMAGING RESULTS:  Imaging: No results found. @PROBHOSP @ No results found.   ASSESSMENT AND PLAN    -Multidisciplinary rounds held today  Acute on chronic hypoxic Respiratory Failure -patient is close to baseline , now optimizing for dc  home   Chronic atrial fibrillation -Eliquis 2.5 mg twice daily, digoxin 0.65 mcg, amiodarone 200 p.o. daily -Patient does not tolerate beta-blockers -follow up cardiac enzymes as indicated ICU monitoring  Severe anxiety disorder -Patient has had a prescription of benzodiazepines before however becomes severely sedated and reports feelings of presyncope    GI/Nutrition GI PROPHYLAXIS as indicated DIET-->TF's as tolerated Constipation protocol as indicated  ENDO - ICU hypoglycemic\Hyperglycemia protocol -check FSBS per protocol   ELECTROLYTES -follow labs as needed -replace as needed -pharmacy consultation   DVT/GI PRX ordered -SCDs  TRANSFUSIONS AS NEEDED MONITOR FSBS ASSESS the need for LABS as needed    Vida Rigger, M.D.  Pulmonary & Critical Care Medicine      This document was prepared using Dragon voice recognition software and may include unintentional dictation errors.

## 2022-07-29 NOTE — Progress Notes (Signed)
PROGRESS NOTE    Sierra Guzman   ZOX:[id number] DOB: 03-22-1941  DOA: 07/26/2022 Date of Service: 07/29/22 PCP: Ethelda Chick, MD     Brief Narrative / Hospital Course:  [name] is a 82 year old female with history of hypertension, non-insulin-dependent diabetes mellitus, hyperlipidemia, neuropathy, depression, anxiety, interstitial lung disease on mycophenolate, GERD, who presents emergency department for chief concerns of increased oxygen requirement from [facility]. Note chronic O2 use between 2.5-5 depending on the activity. Known pressure injury buttocks. C/o dysuria, subjective fever.  04/17: RR 15, heart rate 92, 115/60, SpO2 of 100% on 6 L [place]. DuoNebs one-time dose, Solu-Medrol 125 mg IV one-time dose, azithromycin and ceftriaxone for CAP.  CT PE with findings of interstitial and airspace opacification with bronchiectasis bilaterally and pulmonary fibrosis.  04/18: seen by pulmonology - added Lasix, started aldactone and torsemide po, continue steroids and CellCept, d/c antibiotics.  04/19: SpO2 100% 4L [place]. BCx NGx2d. Still needing BiPap qhs. PT/OT recs for SNF, TOC consulted 04/20: remains on 4L. About at baseline per pulmonary, but substantial weakness/desaturation w/ minimal exertion. TOC unable to reach her facility anyway to discuss possible accepting her back today/tomorrow, will keep here likely thru the weekend    Consultants:  Pulmonology   Procedures: none      ASSESSMENT & PLAN:   Principal Problem:   Acute on chronic respiratory failure with hypoxia Active Problems:   SIRS (systemic inflammatory response syndrome)   Interstitial lung disease   Hypertension   Pressure injury of skin   Coronary artery disease   DDD (degenerative disc disease), cervical   Acute hypoxemic respiratory failure   Dysuria   Anxiety  Acute on chronic respiratory failure with hypoxia and hypercapnia Likely d/t ILD and CHF Continue oxygen supplementation to  maintain SpO2 greater than 92% BiPAP continuous initially -->  now on [place], w/ BiPap qhs/prn Treat underlying causes as below    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 Last 2D echocardiogram from 03/24 showed an LVEF of 30 to 35% with LV global hypokinesis and grade 1 diastolic dysfunction S/p Lasix  Continue torsemide and spironolactone Optimize blood pressure control Monitor BMP   Interstitial lung disease Mycophenolate and dexamethasone resumed  SIRS (systemic inflammatory response syndrome) Sepsis ruled out  Abx d/c   Chronic atrial fibrillation Continue amiodarone, digoxin and Eliquis as primary prevention for an acute stroke Not on beta-blockers d/t intolerance   Hypertension Hydralazine 5 mg IV every 8 hours as needed for SBP greater 175, 4 days ordered   Pressure injury of skin Present on admission Routine care    Coronary artery disease Aspirin 81 mg daily resumed   Anxiety Daughter reports the patient has done well with Valium in the past and that Ativan is too strong for her Diazepam 2.5 mg IV every 6 hours as needed for anxiety, 3 doses ordered.   diazepam can be increased to 5 mg if 2.5 mg is not sufficient for patient   Dysuria Patient endorses dysuria that started over the last 2 days UA was positive for leukocytes, trace.   NO sepsis, No UCx collected, abx d/c, monitor symptoms    DDD (degenerative disc disease), cervical Pain control as needed     DVT prophylaxis: lovenox  Pertinent IV fluids/nutrition: no continuous IV fluids, heart healthy diet  Central lines / invasive devices: none   Code Status: DNR/DNI, per discussion with family and confirmed with daughter [name] by admitting hospitalist  ACP documentation reviewed:  none on file   Current Admission Status: inpatient   TOC needs / Dispo plan: SNF Barriers to discharge / significant pending items: O2 requirement is about baseline at rest but still substantial O2  requirement w/ minimal exertion, expect will be here thru the weekend pending SNF return and improvement in oxygenation              Subjective / Brief ROS:  Patient reports a bit anxious today, concerned about breathing Substantial SOB w/ activity today w/ PT Pain controlled.  Denies new weakness.  Tolerating diet.  Reports no concerns w/ urination/defecation.   Family Communication: none at this time     Objective Findings:  Vitals:   07/28/22 1631 07/28/22 1919 07/29/22 0416 07/29/22 0901  BP:  119/64 126/75 (!) 110/51  Pulse:  81 72 86  Resp:  Temp:  97.8 F (36.6 C) 97.7 F (36.5 C) 98.2 F (36.8 C)  TempSrc:   Oral Oral  SpO2: 100% 100% 100% 100%  Weight:      Height:        Intake/Output Summary (Last 24 hours) at 07/29/2022 1306 Last data filed at 07/29/2022 1026 Gross per 24 hour  Intake 1080 ml  Output 700 ml  Net 380 ml   Filed Weights   07/26/22 2245  Weight: 50.4 kg    Examination:  Physical Exam Constitutional:      General: She is not in acute distress. Cardiovascular:     Rate and Rhythm: Normal rate. Rhythm irregular.  Pulmonary:     Breath sounds: Examination of the right-upper field reveals wheezing. Examination of the left-upper field reveals wheezing. Examination of the right-middle field reveals wheezing and rales. Examination of the left-middle field reveals wheezing and rales. Examination of the right-lower field reveals decreased breath sounds. Examination of the left-lower field reveals decreased breath sounds. Decreased breath sounds, wheezing and rales present. Rhonchi: scattered. Musculoskeletal:     Right lower leg: No edema.     Left lower leg: No edema.  Skin:    General: Skin is warm and dry.  Neurological:     General: No focal deficit present.     Mental Status: She is alert and oriented to person, place, and time.  Psychiatric:        Mood and Affect: Mood normal.        Behavior: Behavior normal.           Scheduled Medications:   amiodarone  200 mg Oral Daily   apixaban  2.5 mg Oral BID   cholecalciferol  1,000 Units Oral Daily   dexamethasone  2 mg Oral Daily   digoxin  0.0625 mg Oral Daily   feeding supplement  237 mL Oral BID BM   gabapentin  100 mg Oral TID   insulin aspart  0-5 Units Subcutaneous QHS   insulin aspart  0-9 Units Subcutaneous TID WC   loratadine  10 mg Oral Daily   magnesium oxide  400 mg Oral Daily   mirtazapine  30 mg Oral QHS   mycophenolate  250 mg Oral BID   pantoprazole  40 mg Oral Daily   sertraline  100 mg Oral Daily   spironolactone  12.5 mg Oral Daily   torsemide  10 mg Oral Daily    Continuous Infusions:   PRN Medications:  acetaminophen **OR** acetaminophen, ALPRAZolam, ondansetron **OR** ondansetron (ZOFRAN) IV, senna-docusate  Antimicrobials from admission:  Anti-infectives (From admission, onward)    Start  Dose/Rate Route Frequency Ordered Stop   07/27/22 1800  cefTRIAXone (ROCEPHIN) 1 g in sodium chloride 0.9 % 100 mL IVPB  Status:  Discontinued        1 g 200 mL/hr over 30 Minutes Intravenous Every 24 hours 07/26/22 1955 07/27/22 0901   07/26/22 2045  azithromycin (ZITHROMAX) 500 mg in sodium chloride 0.9 % 250 mL IVPB  Status:  Discontinued        500 mg 250 mL/hr over 60 Minutes Intravenous Every 24 hours 07/26/22 2031 07/27/22 0901   07/26/22 2000  cefTRIAXone (ROCEPHIN) 1 g in sodium chloride 0.9 % 100 mL IVPB  Status:  Discontinued        1 g 200 mL/hr over 30 Minutes Intravenous Every 24 hours 07/26/22 1947 07/26/22 1955   07/26/22 1730  cefTRIAXone (ROCEPHIN) 2 g in sodium chloride 0.9 % 100 mL IVPB  Status:  Discontinued        2 g 200 mL/hr over 30 Minutes Intravenous Every 24 hours 07/26/22 1722 07/26/22 1908   07/26/22 1730  azithromycin (ZITHROMAX) 500 mg in sodium chloride 0.9 % 250 mL IVPB  Status:  Discontinued        500 mg 250 mL/hr over 60 Minutes Intravenous Every 24 hours 07/26/22 1722 07/26/22  1908   07/26/22 1715  azithromycin (ZITHROMAX) 500 mg in sodium chloride 0.9 % 250 mL IVPB  Status:  Discontinued        500 mg 250 mL/hr over 60 Minutes Intravenous  Once 07/26/22 1705 07/26/22 1722   07/26/22 1715  cefTRIAXone (ROCEPHIN) 1 g in sodium chloride 0.9 % 100 mL IVPB  Status:  Discontinued        1 g 200 mL/hr over 30 Minutes Intravenous  Once 07/26/22 1705 07/26/22 1722           Data Reviewed:  I have personally reviewed the following...  CBC: Recent Labs  Lab 07/26/22 1341 07/27/22 0416  WBC 11.6* 8.2  HGB 10.3* 9.3*  HCT 35.3* 31.8*  MCV 98.6 98.1  PLT 226 215   Basic Metabolic Panel: Recent Labs  Lab 07/26/22 1341 07/27/22 0416 07/29/22 0543  NA 138 141 142  K 4.3 5.0 3.7  CL 95* 100 95*  CO2 34* 34* 39*  GLUCOSE 138* 135* 114*  BUN 22 26* 28*  CREATININE 0.79 0.96 0.85  CALCIUM 8.9 8.9 9.4   GFR: Estimated Creatinine Clearance: 40.6 mL/min (by C-G formula based on SCr of 0.85 mg/dL). Liver Function Tests: No results for input(s): "AST", "ALT", "ALKPHOS", "BILITOT", "PROT", "ALBUMIN" in the last 168 hours. No results for input(s): "LIPASE", "AMYLASE" in the last 168 hours. No results for input(s): "AMMONIA" in the last 168 hours. Coagulation Profile: Recent Labs  Lab 07/27/22 0416  INR 1.2   Cardiac Enzymes: No results for input(s): "CKTOTAL", "CKMB", "CKMBINDEX", "TROPONINI" in the last 168 hours. BNP (last 3 results) No results for input(s): "PROBNP" in the last 8760 hours. HbA1C: Recent Labs    07/27/22 0416  HGBA1C 5.5   CBG: Recent Labs  Lab 07/28/22 1131 07/28/22 1630 07/28/22 2114 07/29/22 0830 07/29/22 1249  GLUCAP 207* 102* 198* 139* 312*   Lipid Profile: No results for input(s): "CHOL", "HDL", "LDLCALC", "TRIG", "CHOLHDL", "LDLDIRECT" in the last 72 hours. Thyroid Function Tests: No results for input(s): "TSH", "T4TOTAL", "FREET4", "T3FREE", "THYROIDAB" in the last 72 hours. Anemia Panel: No results for  input(s): "VITAMINB12", "FOLATE", "FERRITIN", "TIBC", "IRON", "RETICCTPCT" in the last 72 hours. Most Recent Urinalysis On  File:     Component Value Date/Time   COLORURINE YELLOW (A) 07/26/2022 1745   APPEARANCEUR CLEAR (A) 07/26/2022 1745   LABSPEC 1.020 07/26/2022 1745   PHURINE 6.0 07/26/2022 1745   GLUCOSEU >=500 (A) 07/26/2022 1745   HGBUR NEGATIVE 07/26/2022 1745   BILIRUBINUR NEGATIVE 07/26/2022 1745   KETONESUR NEGATIVE 07/26/2022 1745   PROTEINUR NEGATIVE 07/26/2022 1745   NITRITE NEGATIVE 07/26/2022 1745   LEUKOCYTESUR TRACE (A) 07/26/2022 1745   Sepsis Labs: @LABRCNTIP (procalcitonin:4,lacticidven:4) Microbiology: Recent Results (from the past 240 hour(s))  Resp panel by RT-PCR (RSV, Flu A&B, Covid) Anterior Nasal Swab     Status: None   Collection Time: 07/26/22  3:52 PM   Specimen: Anterior Nasal Swab  Result Value Ref Range Status   SARS Coronavirus 2 by RT PCR NEGATIVE NEGATIVE Final    Comment: (NOTE) SARS-CoV-2 target nucleic acids are NOT DETECTED.  The SARS-CoV-2 RNA is generally detectable in upper respiratory specimens during the acute phase of infection. The lowest concentration of SARS-CoV-2 viral copies this assay can detect is 138 copies/mL. A negative result does not preclude SARS-Cov-2 infection and should not be used as the sole basis for treatment or other patient management decisions. A negative result may occur with  improper specimen collection/handling, submission of specimen other than nasopharyngeal swab, presence of viral mutation(s) within the areas targeted by this assay, and inadequate number of viral copies(<138 copies/mL). A negative result must be combined with clinical observations, patient history, and epidemiological information. The expected result is Negative.  Fact Sheet for Patients:  BloggerCourse.com  Fact Sheet for Healthcare Providers:  SeriousBroker.it  This test is no  t yet approved or cleared by the [place] FDA and  has been authorized for detection and/or diagnosis of SARS-CoV-2 by FDA under an Emergency Use Authorization (EUA). This EUA will remain  in effect (meaning this test can be used) for the duration of the COVID-19 declaration under Section 564(b)(1) of the Act, 21 U.S.C.section 360bbb-3(b)(1), unless the authorization is terminated  or revoked sooner.       Influenza A by PCR NEGATIVE NEGATIVE Final   Influenza B by PCR NEGATIVE NEGATIVE Final    Comment: (NOTE) The Xpert Xpress SARS-CoV-2/FLU/RSV plus assay is intended as an aid in the diagnosis of influenza from Nasopharyngeal swab specimens and should not be used as a sole basis for treatment. Nasal washings and aspirates are unacceptable for Xpert Xpress SARS-CoV-2/FLU/RSV testing.  Fact Sheet for Patients: BloggerCourse.com  Fact Sheet for Healthcare Providers: SeriousBroker.it  This test is not yet approved or cleared by the [place] FDA and has been authorized for detection and/or diagnosis of SARS-CoV-2 by FDA under an Emergency Use Authorization (EUA). This EUA will remain in effect (meaning this test can be used) for the duration of the COVID-19 declaration under Section 564(b)(1) of the Act, 21 U.S.C. section 360bbb-3(b)(1), unless the authorization is terminated or revoked.     Resp Syncytial Virus by PCR NEGATIVE NEGATIVE Final    Comment: (NOTE) Fact Sheet for Patients: BloggerCourse.com  Fact Sheet for Healthcare Providers: SeriousBroker.it  This test is not yet approved or cleared by the [place] FDA and has been authorized for detection and/or diagnosis of SARS-CoV-2 by FDA under an Emergency Use Authorization (EUA). This EUA will remain in effect (meaning this test can be used) for the duration of the COVID-19 declaration under Section  564(b)(1) of the Act, 21 U.S.C. section 360bbb-3(b)(1), unless the authorization is terminated or revoked.  Performed at  [facility], [address], [place], [place] [address]   Culture, blood (Routine X 2) w Reflex to ID Panel     Status: None (Preliminary result)   Collection Time: 07/26/22  5:45 PM   Specimen: BLOOD  Result Value Ref Range Status   Specimen Description BLOOD RIGHT ANTECUBITAL  Final   Special Requests   Final    BOTTLES DRAWN AEROBIC AND ANAEROBIC Blood Culture adequate volume   Culture   Final    NO GROWTH 3 DAYS Performed at [facility], [address] [place], [place] [address]    Report Status PENDING  Incomplete  Culture, blood (Routine X 2) w Reflex to ID Panel     Status: None (Preliminary result)   Collection Time: 07/26/22  5:45 PM   Specimen: BLOOD  Result Value Ref Range Status   Specimen Description BLOOD LEFT ANTECUBITAL  Final   Special Requests   Final    BOTTLES DRAWN AEROBIC AND ANAEROBIC Blood Culture adequate volume   Culture   Final    NO GROWTH 3 DAYS Performed at [facility], [address] [place], [place] [address]    Report Status PENDING  Incomplete  MRSA Next Gen by PCR, Nasal     Status: None   Collection Time: 07/26/22 10:40 PM   Specimen: Nasal Mucosa; Nasal Swab  Result Value Ref Range Status   MRSA by PCR Next Gen NOT DETECTED NOT DETECTED Final    Comment: (NOTE) The GeneXpert MRSA Assay (FDA approved for NASAL specimens only), is one component of a comprehensive MRSA colonization surveillance program. It is not intended to diagnose MRSA infection nor to guide or monitor treatment for MRSA infections. Test performance is not FDA approved in patients less than 83 years old. Performed at [facility], [address] [place], [place] [address]       Radiology Studies last 3 days: CT Angio Chest Pulmonary Embolism (PE) W or WO Contrast  Result Date: 07/26/2022 CLINICAL  DATA:  Chronic respiratory failure with hypoxia. PE suspected. EXAM: CT ANGIOGRAPHY CHEST WITH CONTRAST TECHNIQUE: Multidetector CT imaging of the chest was performed using the standard protocol during bolus administration of intravenous contrast. Multiplanar CT image reconstructions and MIPs were obtained to evaluate the vascular anatomy. RADIATION DOSE REDUCTION: This exam was performed according to the departmental dose-optimization program which includes automated exposure control, adjustment of the mA and/or kV according to patient size and/or use of iterative reconstruction technique. CONTRAST:  75mL OMNIPAQUE IOHEXOL 350 MG/ML SOLN COMPARISON:  Chest radiographs 07/26/2022 and CTA chest 01/20/2022 FINDINGS: Cardiovascular: Satisfactory opacification of the pulmonary arteries to the segmental level. No acute pulmonary embolism. Cardiomegaly. Trace pericardial effusion. Coronary artery and aortic atherosclerotic calcification. Mediastinum/Nodes: Moderate hiatal hernia. Esophagus is otherwise unremarkable. Similar mediastinal adenopathy. For example a pretracheal node again measures 14 mm in short axis (series 4/image 37). Lungs/Pleura: Diffuse bilateral pulmonary interstitial lung disease with central ground-glass opacity, peripheral reticular opacities and honeycombing. Architectural distortion and bronchiectasis/bronchiolectasis greatest in the lower lungs. Mildly increased ground-glass opacities in the left upper lobe compared to 01/20/2022 may be due to infection or edema. Wispy secretions in the trachea. Upper Abdomen: No acute abnormality. Musculoskeletal: Thoracic spondylosis. No acute fracture or destructive osseous lesion. Review of the MIP images confirms the above findings. IMPRESSION: 1. No acute pulmonary embolism. 2. Chronic interstitial lung disease. New ground-glass opacities in the left upper lobe may be due to edema or infection. 3. Cardiomegaly and coronary artery  calcification. 4. Moderate  hiatal hernia. Aortic Atherosclerosis (ICD10-I70.0). Electronically Signed   By: Minerva Fester M.D.   On: 07/26/2022 20:19   DG Chest 2 View  Result Date: 07/26/2022 CLINICAL DATA:  SOB EXAM: CHEST - 2 VIEW COMPARISON:  01/19/2022 FINDINGS: Enlarged cardiac silhouette. Extensive pulmonary interstitial fibrosis. No pneumothorax or pleural effusion identified. No focal alveolar consolidation. Osseous structures appear grossly intact. IMPRESSION: Enlarged cardiac silhouette.  Extensive pulmonary fibrosis. Electronically Signed   By: Layla Maw M.D.   On: 07/26/2022 14:26             LOS: 3 days      Sunnie Nielsen, DO [facility] 07/29/2022, 1:06 PM    Dictation software may have been used to generate the above note. Typos may occur and escape review in typed/dictated notes. Please contact Dr Lyn Hollingshead directly for clarity if needed.  Staff may message me via secure chat in Epic  but this may not receive an immediate response,  please page me for urgent matters!  If 7PM-7AM, please contact night coverage [email or web address]

## 2022-07-29 NOTE — Plan of Care (Signed)
  Problem: Respiratory: Goal: Ability to maintain adequate ventilation will improve Outcome: Progressing   Problem: Activity: Goal: Risk for activity intolerance will decrease Outcome: Progressing   Problem: Nutrition: Goal: Adequate nutrition will be maintained Outcome: Progressing   Problem: Safety: Goal: Ability to remain free from injury will improve Outcome: Progressing   

## 2022-07-29 NOTE — Progress Notes (Signed)
Physical Therapy Treatment Patient Details Name: Sierra Guzman MRN: [id number] DOB: May 28, 1940 Today's Date: 07/29/2022   History of Present Illness [name] is a 82 year old female with history of hypertension, non-insulin-dependent diabetes mellitus, hyperlipidemia, neuropathy, depression, anxiety, interstitial lung disease on mycophenolate, GERD, who presents emergency department for chief concerns of increased oxygen requirement from [employer].    PT Comments    Pt received up in chair on 4L O2 with resting SpO2 at 97%, HR 78-84bpm. Discussed role of PT and current goals to assess ability to progress functional mobility for return to SNF. Pt required MinA to stand from recliner to RW. Pt assisted with CGA for safety to ambulate forward 25ft and back to recliner for seated rest break. Significant increased fatigue and SOB noted, SpO2 taken from toe due to poor perfusion which had dropped to 70% with HR irregular between 84-114bpm. Pt required 6 minutes rest with cues for PLB and relaxation technique to return SpO2 to 91%. Pt unable to tolerate further activity. Dr. Lyn Hollingshead notified of pt's difficult recovery time after minimal activity.     Recommendations for follow up therapy are one component of a multi-disciplinary discharge planning process, led by the attending physician.  Recommendations may be updated based on patient status, additional functional criteria and insurance authorization.  Follow Up Recommendations  Can patient physically be transported by private vehicle: No    Assistance Recommended at Discharge Frequent or constant Supervision/Assistance  Patient can return home with the following A little help with walking and/or transfers;A little help with bathing/dressing/bathroom;Help with stairs or ramp for entrance;Assist for transportation   Equipment Recommendations  None recommended by PT    Recommendations for Other Services       Precautions /  Restrictions Precautions Precautions: Fall Restrictions Weight Bearing Restrictions: No     Mobility  Bed Mobility               General bed mobility comments: NT, patient in recliner and remained in recliner    Transfers Overall transfer level: Needs assistance Equipment used: Rolling walker (2 wheels) Transfers: Sit to/from Stand Sit to Stand: Min assist                Ambulation/Gait Ambulation/Gait assistance: Min guard Gait Distance (Feet):  (12) Assistive device: Rolling walker (2 wheels) Gait Pattern/deviations: Step-through pattern, Decreased step length - right, Decreased step length - left, Decreased stride length, Trunk flexed Gait velocity: decr     General Gait Details:  (Pt ambulated 44ft forward & 22ft back.)   Stairs             Wheelchair Mobility    Modified Rankin (Stroke Patients Only)       Balance Overall balance assessment: Needs assistance Sitting-balance support: Feet supported Sitting balance-Leahy Scale: Good     Standing balance support: Bilateral upper extremity supported, During functional activity, Reliant on assistive device for balance Standing balance-Leahy Scale: Fair Standing balance comment:  (quick to fatigue in standing)                            Cognition Arousal/Alertness: Awake/alert Behavior During Therapy: WFL for tasks assessed/performed Overall Cognitive Status: Within Functional Limits for tasks assessed                                 General Comments: pleasant and cooperative within ability  Exercises General Exercises - Lower Extremity Ankle Circles/Pumps: AROM, Both, 10 reps, Seated Long Arc Quad: AROM, Both, 10 reps, Seated    General Comments General comments (skin integrity, edema, etc.):  (Pt incontinent of loose stool and urine)      Pertinent Vitals/Pain Pain Assessment Pain Assessment: Faces Faces Pain Scale: Hurts a little bit Pain  Location:  (buttocks) Pain Descriptors / Indicators: Aching, Discomfort Pain Intervention(s): Limited activity within patient's tolerance, Repositioned    Home Living                          Prior Function            PT Goals (current goals can now be found in the care plan section) Acute Rehab PT Goals Patient Stated Goal: to return to rehab to get stronger Progress towards PT goals: Progressing toward goals    Frequency    Min 2X/week      PT Plan Current plan remains appropriate    Co-evaluation              AM-PAC PT "6 Clicks" Mobility   Outcome Measure  Help needed turning from your back to your side while in a flat bed without using bedrails?: A Lot Help needed moving from lying on your back to sitting on the side of a flat bed without using bedrails?: A Lot Help needed moving to and from a bed to a chair (including a wheelchair)?: A Little Help needed standing up from a chair using your arms (e.g., wheelchair or bedside chair)?: A Little Help needed to walk in hospital room?: A Lot Help needed climbing 3-5 steps with a railing? : Total 6 Click Score: 13    End of Session Equipment Utilized During Treatment: Gait belt;Oxygen (4L O2 [facility]) Activity Tolerance: Other (comment) (Limited by DOE and desaturation) Patient left: in chair;with call bell/phone within reach;with chair alarm set Nurse Communication: Mobility status;Other (comment) (Drop in O2 sats) PT Visit Diagnosis: Other abnormalities of gait and mobility (R26.89);Muscle weakness (generalized) (M62.81);Difficulty in walking, not elsewhere classified (R26.2);Unsteadiness on feet (R26.81)     Time: 1050-1110 PT Time Calculation (min) (ACUTE ONLY): 20 min  Charges:  $Therapeutic Activity: 8-22 mins                    Zadie Cleverly, PTA  Jannet Askew 07/29/2022, 1:24 PM

## 2022-07-30 ENCOUNTER — Inpatient Hospital Stay: Payer: Medicare Other

## 2022-07-30 DIAGNOSIS — J9621 Acute and chronic respiratory failure with hypoxia: Secondary | ICD-10-CM | POA: Diagnosis not present

## 2022-07-30 LAB — DIGOXIN LEVEL: Digoxin Level: 1.2 ng/mL (ref 0.8–2.0)

## 2022-07-30 LAB — GLUCOSE, CAPILLARY
Glucose-Capillary: 118 mg/dL — ABNORMAL HIGH (ref 70–99)
Glucose-Capillary: 235 mg/dL — ABNORMAL HIGH (ref 70–99)
Glucose-Capillary: 149 mg/dL — ABNORMAL HIGH (ref 70–99)
Glucose-Capillary: 198 mg/dL — ABNORMAL HIGH (ref 70–99)

## 2022-07-30 LAB — CULTURE, BLOOD (ROUTINE X 2): Special Requests: ADEQUATE

## 2022-07-30 MED ORDER — DEXAMETHASONE 0.5 MG PO TABS
1.0000 mg | ORAL_TABLET | Freq: Every day | ORAL | Status: DC
  Administered 2022-07-31 – 2022-08-01 (×2): 1 mg via ORAL
  Filled 2022-07-30 (×2): qty 2

## 2022-07-30 NOTE — TOC Progression Note (Addendum)
Transition of Care [facility]) - Progression Note    Patient Details  Name: Sierra Guzman MRN: [id number] Date of Birth: 1940-06-17  [facility] CM/SW Contact  Bing Quarry, RN Phone Number: 07/30/2022, 10:13 AM  Clinical Narrative: CSW referral with new signed FL2 sent via HUB to [facility] for review for readmission. AC indicated on 07/30/22 that DON would need to review as well, likely for the oxygen requirements needed at discharge prior to accepting patient.  Will need new insurance authorization if accepted back to [employer] [facility]. Request pending in HUB at 1015 am. Gabriel Cirri RN CM     4/21: 350 pm: Per mobility progress note dated today at 11:11 am: "Pt resting in bed on 5L upon entry. Pt STS and transfers to [facility] ModA. Pt desat to 85% during transfer and recovered to 90 after 2-3 minutes of rest while on BSC". Patient also noted to be on 5L/[place] at 0730 per nursing flowsheet documentation. Gabriel Cirri RN CM     Barriers to Discharge: Other (must enter comment) (Unable to reach facility to confirm readmission needs over the weekend. 07/29/22.)  Expected Discharge Plan and Services                         DME Arranged: N/A DME Agency: NA       HH Arranged: NA HH Agency: NA         Social Determinants of Health (SDOH) Interventions SDOH Screenings   Food Insecurity: No Food Insecurity (01/20/2022)  Housing: Low Risk  (01/20/2022)  Transportation Needs: No Transportation Needs (01/20/2022)  Utilities: Not At Risk (01/20/2022)  Depression (PHQ2-9): Low Risk  (09/14/2020)  Recent Concern: Depression (PHQ2-9) - Medium Risk (08/10/2020)  Tobacco Use: Medium Risk (07/26/2022)    Readmission Risk Interventions    06/13/2021    1:06 PM 03/09/2021   12:37 PM  Readmission Risk Prevention Plan  Transportation Screening Complete Complete  PCP or Specialist Appt within 3-5 Days  Complete  HRI or Home Care Consult  Complete  Social Work Consult for Recovery Care  Planning/Counseling  Complete  Palliative Care Screening  Not Applicable  Medication Review Oceanographer) Complete Complete  PCP or Specialist appointment within 3-5 days of discharge Complete   HRI or Home Care Consult Complete   SW Recovery Care/Counseling Consult Complete   Palliative Care Screening Not Applicable   Skilled Nursing Facility Patient Refused

## 2022-07-30 NOTE — Progress Notes (Signed)
Mobility Specialist - Progress Note   Pre-mobility:SpO2(96) During mobility: SpO2(85) Post-mobility: SPO2 (100)     07/30/22 1059  Mobility  Activity Transferred to/from BSC;Dangled on edge of bed  Level of Assistance Moderate assist, patient does 50-74%  Assistive Device None  Range of Motion/Exercises Active  Activity Response Tolerated well  Mobility Referral Yes  $Mobility charge 1 Mobility   Pt resting in bed on 5L upon entry. Pt STS and transfers to [facility] ModA. Pt desat to 85% during transfer and recovered to 90 after 2-3 minutes of rest while on BSC. Pt transferred back to bed and left with needs in reach and bed alarm activated.   Sierra Guzman Mobility Specialist 07/30/22, 11:10 AM

## 2022-07-30 NOTE — Progress Notes (Signed)
Mobility Specialist - Progress Note     07/30/22 1706  Mobility  Activity Transferred to/from BSC;Stood at bedside;Transferred from chair to bed;Dangled on edge of bed  Level of Assistance Moderate assist, patient does 50-74%  Range of Motion/Exercises Active  Activity Response Tolerated well  Mobility Referral Yes  $Mobility charge 1 Mobility   Pt resting in bed on RA upon entry. Pt STS and pivot transfers to [facility] ModA with no AD. Pt desat to 85% but recovered quickly. Pt returned to bed and left with needs in reach and bed alarm activated. Daughter present at bedside. RN discontinued telemetry.   Johnathan Hausen Mobility Specialist 07/30/22, 5:41 PM

## 2022-07-30 NOTE — Progress Notes (Signed)
PROGRESS NOTE    Sierra Guzman   ZOX:[id number] DOB: [birth year]  DOA: 07/26/2022 Date of Service: 07/30/22 PCP: Ethelda Chick, MD     Brief Narrative / Hospital Course:  [name] is a 82 year old female with history of hypertension, non-insulin-dependent diabetes mellitus, hyperlipidemia, neuropathy, depression, anxiety, interstitial lung disease on mycophenolate, GERD, who presents emergency department for chief concerns of increased oxygen requirement from [facility]. Note chronic O2 use between 2.5-5 depending on the activity. Known pressure injury buttocks. C/o dysuria, subjective fever.  04/17: RR 15, heart rate 92, 115/60, SpO2 of 100% on 6 L [place]. DuoNebs one-time dose, Solu-Medrol 125 mg IV one-time dose, azithromycin and ceftriaxone for CAP.  CT PE with findings of interstitial and airspace opacification with bronchiectasis bilaterally and pulmonary fibrosis.  04/18: seen by pulmonology - added Lasix, started aldactone and torsemide po, continue steroids and CellCept, d/c antibiotics.  04/19: SpO2 100% 4L [place]. BCx NGx2d. Still needing BiPap qhs. PT/OT recs for SNF, TOC consulted 04/20: remains on 4L. About at baseline per pulmonary as of 04/20, but substantial weakness/desaturation/tachycardia w/ minimal exertion. TOC unable to reach her facility anyway to discuss possible accepting her back over the weekend will continue to monitor and O2 support, telemetry   Consultants:  Pulmonology   Procedures: none      ASSESSMENT & PLAN:   Principal Problem:   Acute on chronic respiratory failure with hypoxia Active Problems:   SIRS (systemic inflammatory response syndrome)   Interstitial lung disease   Hypertension   Pressure injury of skin   Coronary artery disease   DDD (degenerative disc disease), cervical   Acute hypoxemic respiratory failure   Dysuria   Anxiety  Acute on chronic respiratory failure with hypoxia and hypercapnia Likely d/t ILD and  CHF Continue oxygen supplementation to maintain SpO2 greater than 92% BiPAP continuous initially -->  now on [place], w/ BiPap qhs/prn Treat underlying causes as below    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 Last 2D echocardiogram from 03/24 showed an LVEF of 30 to 35% with LV global hypokinesis and grade 1 diastolic dysfunction S/p Lasix  Continue torsemide and spironolactone Optimize blood pressure control - BP occasionally soft, would avoid increase meds at this time  Monitor BMP   Interstitial lung disease Mycophenolate and dexamethasone resumed  SIRS (systemic inflammatory response syndrome) Sepsis ruled out  Abx d/c   Chronic atrial fibrillation Continue amiodarone, digoxin and Eliquis as primary prevention for an acute stroke Not on beta-blockers d/t intolerance   Hypertension Hydralazine 5 mg IV every 8 hours as needed for SBP greater 175, 4 days ordered   Pressure injury of skin Present on admission Routine care    Coronary artery disease Aspirin 81 mg daily resumed   Anxiety Daughter reports the patient has done well with Valium in the past and that Ativan is too strong for her Diazepam 2.5 mg IV every 6 hours as needed for anxiety, 3 doses ordered.   diazepam can be increased to 5 mg if 2.5 mg is not sufficient for patient   Dysuria Patient endorses dysuria that started over the last 2 days UA was positive for leukocytes, trace.   NO sepsis, No UCx collected, abx d/c, monitor symptoms    DDD (degenerative disc disease), cervical Pain control as needed     DVT prophylaxis: lovenox  Pertinent IV fluids/nutrition: no continuous IV fluids, heart healthy diet  Central lines / invasive devices: none   Code Status:  DNR/DNI, per discussion with family and confirmed with daughter Sierra Guzman by admitting hospitalist  ACP documentation reviewed: none on file   Current Admission Status: inpatient   TOC needs / Dispo plan: SNF Barriers to  discharge / significant pending items: O2 requirement is about baseline at rest but still substantial O2 requirement w/ minimal exertion, expect will be here thru the weekend pending SNF return and improvement in oxygenation              Subjective / Brief ROS:  Patient reports a bit anxious today, concerned about breathing, very SOB w/ minimal movement even to [facility] Pain controlled - requests tylenol for her bedsore pain and requests padding for bedsores  Denies new weakness.  Tolerating diet.  Reports no concerns w/ urination/defecation.   Family Communication: none at this time     Objective Findings:  Vitals:   07/29/22 1536 07/29/22 2118 07/30/22 0322 07/30/22 0730  BP: (!) 103/54 117/63 (!) 119/57 121/63  Pulse: 88 85 81 82  Resp: 20 20 20  (!) 22  Temp: 98.2 F (36.8 C) 98.2 F (36.8 C) 98.3 F (36.8 C) 98 F (36.7 C)  TempSrc:  Oral Oral   SpO2: 100% 100% 99% 100%  Weight:      Height:        Intake/Output Summary (Last 24 hours) at 07/30/2022 1211 Last data filed at 07/30/2022 9147 Gross per 24 hour  Intake --  Output 200 ml  Net -200 ml   Filed Weights   07/26/22 2245  Weight: 50.4 kg    Examination:  Physical Exam Constitutional:      General: She is not in acute distress. Cardiovascular:     Rate and Rhythm: Normal rate. Rhythm irregular.  Pulmonary:     Breath sounds: Examination of the right-upper field reveals wheezing. Examination of the left-upper field reveals wheezing. Examination of the right-middle field reveals wheezing and rales. Examination of the left-middle field reveals wheezing and rales. Examination of the right-lower field reveals decreased breath sounds. Examination of the left-lower field reveals decreased breath sounds. Decreased breath sounds, wheezing and rales present. Rhonchi: scattered. Musculoskeletal:     Right lower leg: No edema.     Left lower leg: No edema.  Skin:    General: Skin is warm and dry.  Neurological:      General: No focal deficit present.     Mental Status: She is alert and oriented to person, place, and time.  Psychiatric:        Mood and Affect: Mood normal.        Behavior: Behavior normal.          Scheduled Medications:   amiodarone  200 mg Oral Daily   apixaban  2.5 mg Oral BID   cholecalciferol  1,000 Units Oral Daily   dexamethasone  2 mg Oral Daily   digoxin  0.0625 mg Oral Daily   feeding supplement  237 mL Oral BID BM   gabapentin  100 mg Oral TID   insulin aspart  0-5 Units Subcutaneous QHS   insulin aspart  0-9 Units Subcutaneous TID WC   loratadine  10 mg Oral Daily   magnesium oxide  400 mg Oral Daily   mirtazapine  30 mg Oral QHS   mycophenolate  250 mg Oral BID   pantoprazole  40 mg Oral Daily   sertraline  100 mg Oral Daily   spironolactone  12.5 mg Oral Daily   torsemide  10 mg Oral Daily  Continuous Infusions:   PRN Medications:  acetaminophen **OR** acetaminophen, ALPRAZolam, ondansetron **OR** ondansetron (ZOFRAN) IV, senna-docusate  Antimicrobials from admission:  Anti-infectives (From admission, onward)    Start     Dose/Rate Route Frequency Ordered Stop   07/27/22 1800  cefTRIAXone (ROCEPHIN) 1 g in sodium chloride 0.9 % 100 mL IVPB  Status:  Discontinued        1 g 200 mL/hr over 30 Minutes Intravenous Every 24 hours 07/26/22 1955 07/27/22 0901   07/26/22 2045  azithromycin (ZITHROMAX) 500 mg in sodium chloride 0.9 % 250 mL IVPB  Status:  Discontinued        500 mg 250 mL/hr over 60 Minutes Intravenous Every 24 hours 07/26/22 2031 07/27/22 0901   07/26/22 2000  cefTRIAXone (ROCEPHIN) 1 g in sodium chloride 0.9 % 100 mL IVPB  Status:  Discontinued        1 g 200 mL/hr over 30 Minutes Intravenous Every 24 hours 07/26/22 1947 07/26/22 1955   07/26/22 1730  cefTRIAXone (ROCEPHIN) 2 g in sodium chloride 0.9 % 100 mL IVPB  Status:  Discontinued        2 g 200 mL/hr over 30 Minutes Intravenous Every 24 hours 07/26/22 1722 07/26/22 1908    07/26/22 1730  azithromycin (ZITHROMAX) 500 mg in sodium chloride 0.9 % 250 mL IVPB  Status:  Discontinued        500 mg 250 mL/hr over 60 Minutes Intravenous Every 24 hours 07/26/22 1722 07/26/22 1908   07/26/22 1715  azithromycin (ZITHROMAX) 500 mg in sodium chloride 0.9 % 250 mL IVPB  Status:  Discontinued        500 mg 250 mL/hr over 60 Minutes Intravenous  Once 07/26/22 1705 07/26/22 1722   07/26/22 1715  cefTRIAXone (ROCEPHIN) 1 g in sodium chloride 0.9 % 100 mL IVPB  Status:  Discontinued        1 g 200 mL/hr over 30 Minutes Intravenous  Once 07/26/22 1705 07/26/22 1722           Data Reviewed:  I have personally reviewed the following...  CBC: Recent Labs  Lab 07/26/22 1341 07/27/22 0416  WBC 11.6* 8.2  HGB 10.3* 9.3*  HCT 35.3* 31.8*  MCV 98.6 98.1  PLT 226 215   Basic Metabolic Panel: Recent Labs  Lab 07/26/22 1341 07/27/22 0416 07/29/22 0543  NA 138 141 142  K 4.3 5.0 3.7  CL 95* 100 95*  CO2 34* 34* 39*  GLUCOSE 138* 135* 114*  BUN 22 26* 28*  CREATININE 0.79 0.96 0.85  CALCIUM 8.9 8.9 9.4   GFR: Estimated Creatinine Clearance: 40.6 mL/min (by C-G formula based on SCr of 0.85 mg/dL). Liver Function Tests: No results for input(s): "AST", "ALT", "ALKPHOS", "BILITOT", "PROT", "ALBUMIN" in the last 168 hours. No results for input(s): "LIPASE", "AMYLASE" in the last 168 hours. No results for input(s): "AMMONIA" in the last 168 hours. Coagulation Profile: Recent Labs  Lab 07/27/22 0416  INR 1.2   Cardiac Enzymes: No results for input(s): "CKTOTAL", "CKMB", "CKMBINDEX", "TROPONINI" in the last 168 hours. BNP (last 3 results) No results for input(s): "PROBNP" in the last 8760 hours. HbA1C: No results for input(s): "HGBA1C" in the last 72 hours.  CBG: Recent Labs  Lab 07/29/22 1249 07/29/22 1719 07/29/22 2122 07/30/22 0806 07/30/22 1138  GLUCAP 312* 83 104* 118* 235*   Lipid Profile: No results for input(s): "CHOL", "HDL", "LDLCALC",  "TRIG", "CHOLHDL", "LDLDIRECT" in the last 72 hours. Thyroid Function Tests: No results  for input(s): "TSH", "T4TOTAL", "FREET4", "T3FREE", "THYROIDAB" in the last 72 hours. Anemia Panel: No results for input(s): "VITAMINB12", "FOLATE", "FERRITIN", "TIBC", "IRON", "RETICCTPCT" in the last 72 hours. Most Recent Urinalysis On File:     Component Value Date/Time   COLORURINE YELLOW (A) 07/26/2022 1745   APPEARANCEUR CLEAR (A) 07/26/2022 1745   LABSPEC 1.020 07/26/2022 1745   PHURINE 6.0 07/26/2022 1745   GLUCOSEU >=500 (A) 07/26/2022 1745   HGBUR NEGATIVE 07/26/2022 1745   BILIRUBINUR NEGATIVE 07/26/2022 1745   KETONESUR NEGATIVE 07/26/2022 1745   PROTEINUR NEGATIVE 07/26/2022 1745   NITRITE NEGATIVE 07/26/2022 1745   LEUKOCYTESUR TRACE (A) 07/26/2022 1745   Sepsis Labs: (procalcitonin:4,lacticidven:4) Microbiology: Recent Results (from the past 240 hour(s))  Resp panel by RT-PCR (RSV, Flu A&B, Covid) Anterior Nasal Swab     Status: None   Collection Time: 07/26/22  3:52 PM   Specimen: Anterior Nasal Swab  Result Value Ref Range Status   SARS Coronavirus 2 by RT PCR NEGATIVE NEGATIVE Final    Comment: (NOTE) SARS-CoV-2 target nucleic acids are NOT DETECTED.  The SARS-CoV-2 RNA is generally detectable in upper respiratory specimens during the acute phase of infection. The lowest concentration of SARS-CoV-2 viral copies this assay can detect is 138 copies/mL. A negative result does not preclude SARS-Cov-2 infection and should not be used as the sole basis for treatment or other patient management decisions. A negative result may occur with  improper specimen collection/handling, submission of specimen other than nasopharyngeal swab, presence of viral mutation(s) within the areas targeted by this assay, and inadequate number of viral copies(<138 copies/mL). A negative result must be combined with clinical observations, patient history, and epidemiological information.  The expected result is Negative.  Fact Sheet for Patients:  BloggerCourse.com  Fact Sheet for Healthcare Providers:  SeriousBroker.it  This test is no t yet approved or cleared by the [place] FDA and  has been authorized for detection and/or diagnosis of SARS-CoV-2 by FDA under an Emergency Use Authorization (EUA). This EUA will remain  in effect (meaning this test can be used) for the duration of the COVID-19 declaration under Section 564(b)(1) of the Act, 21 U.S.C.section 360bbb-3(b)(1), unless the authorization is terminated  or revoked sooner.       Influenza A by PCR NEGATIVE NEGATIVE Final   Influenza B by PCR NEGATIVE NEGATIVE Final    Comment: (NOTE) The Xpert Xpress SARS-CoV-2/FLU/RSV plus assay is intended as an aid in the diagnosis of influenza from Nasopharyngeal swab specimens and should not be used as a sole basis for treatment. Nasal washings and aspirates are unacceptable for Xpert Xpress SARS-CoV-2/FLU/RSV testing.  Fact Sheet for Patients: BloggerCourse.com  Fact Sheet for Healthcare Providers: SeriousBroker.it  This test is not yet approved or cleared by the [place] FDA and has been authorized for detection and/or diagnosis of SARS-CoV-2 by FDA under an Emergency Use Authorization (EUA). This EUA will remain in effect (meaning this test can be used) for the duration of the COVID-19 declaration under Section 564(b)(1) of the Act, 21 U.S.C. section 360bbb-3(b)(1), unless the authorization is terminated or revoked.     Resp Syncytial Virus by PCR NEGATIVE NEGATIVE Final    Comment: (NOTE) Fact Sheet for Patients: BloggerCourse.com  Fact Sheet for Healthcare Providers: SeriousBroker.it  This test is not yet approved or cleared by the [place] FDA and has been authorized for detection and/or  diagnosis of SARS-CoV-2 by FDA under an Emergency Use Authorization (EUA). This EUA will remain in effect (  meaning this test can be used) for the duration of the COVID-19 declaration under Section 564(b)(1) of the Act, 21 U.S.C. section 360bbb-3(b)(1), unless the authorization is terminated or revoked.  Performed at [facility], [address] [place], [place] [address]   Culture, blood (Routine X 2) w Reflex to ID Panel     Status: None (Preliminary result)   Collection Time: 07/26/22  5:45 PM   Specimen: BLOOD  Result Value Ref Range Status   Specimen Description BLOOD RIGHT ANTECUBITAL  Final   Special Requests   Final    BOTTLES DRAWN AEROBIC AND ANAEROBIC Blood Culture adequate volume   Culture   Final    NO GROWTH 4 DAYS Performed at [facility], [address] [place], [place] [address]    Report Status PENDING  Incomplete  Culture, blood (Routine X 2) w Reflex to ID Panel     Status: None (Preliminary result)   Collection Time: 07/26/22  5:45 PM   Specimen: BLOOD  Result Value Ref Range Status   Specimen Description BLOOD LEFT ANTECUBITAL  Final   Special Requests   Final    BOTTLES DRAWN AEROBIC AND ANAEROBIC Blood Culture adequate volume   Culture   Final    NO GROWTH 4 DAYS Performed at [facility], [address], [place], [place] [address]    Report Status PENDING  Incomplete  MRSA Next Gen by PCR, Nasal     Status: None   Collection Time: 07/26/22 10:40 PM   Specimen: Nasal Mucosa; Nasal Swab  Result Value Ref Range Status   MRSA by PCR Next Gen NOT DETECTED NOT DETECTED Final    Comment: (NOTE) The GeneXpert MRSA Assay (FDA approved for NASAL specimens only), is one component of a comprehensive MRSA colonization surveillance program. It is not intended to diagnose MRSA infection nor to guide or monitor treatment for MRSA infections. Test performance is not FDA approved in patients less than 76 years old. Performed at  [facility], [address] [place], [place] [address]       Radiology Studies last 3 days: CT Angio Chest Pulmonary Embolism (PE) W or WO Contrast  Result Date: 07/26/2022 CLINICAL DATA:  Chronic respiratory failure with hypoxia. PE suspected. EXAM: CT ANGIOGRAPHY CHEST WITH CONTRAST TECHNIQUE: Multidetector CT imaging of the chest was performed using the standard protocol during bolus administration of intravenous contrast. Multiplanar CT image reconstructions and MIPs were obtained to evaluate the vascular anatomy. RADIATION DOSE REDUCTION: This exam was performed according to the departmental dose-optimization program which includes automated exposure control, adjustment of the mA and/or kV according to patient size and/or use of iterative reconstruction technique. CONTRAST:  75mL OMNIPAQUE IOHEXOL 350 MG/ML SOLN COMPARISON:  Chest radiographs 07/26/2022 and CTA chest 01/20/2022 FINDINGS: Cardiovascular: Satisfactory opacification of the pulmonary arteries to the segmental level. No acute pulmonary embolism. Cardiomegaly. Trace pericardial effusion. Coronary artery and aortic atherosclerotic calcification. Mediastinum/Nodes: Moderate hiatal hernia. Esophagus is otherwise unremarkable. Similar mediastinal adenopathy. For example a pretracheal node again measures 14 mm in short axis (series 4/image 37). Lungs/Pleura: Diffuse bilateral pulmonary interstitial lung disease with central ground-glass opacity, peripheral reticular opacities and honeycombing. Architectural distortion and bronchiectasis/bronchiolectasis greatest in the lower lungs. Mildly increased ground-glass opacities in the left upper lobe compared to 01/20/2022 may be due to infection or edema. Wispy secretions in the trachea. Upper Abdomen: No acute abnormality. Musculoskeletal: Thoracic spondylosis. No acute fracture or destructive osseous lesion. Review of the MIP images confirms the  above findings. IMPRESSION: 1. No acute  pulmonary embolism. 2. Chronic interstitial lung disease. New ground-glass opacities in the left upper lobe may be due to edema or infection. 3. Cardiomegaly and coronary artery calcification. 4. Moderate hiatal hernia. Aortic Atherosclerosis (ICD10-I70.0). Electronically Signed   By: Minerva Fester M.D.   On: 07/26/2022 20:19   DG Chest 2 View  Result Date: 07/26/2022 CLINICAL DATA:  SOB EXAM: CHEST - 2 VIEW COMPARISON:  01/19/2022 FINDINGS: Enlarged cardiac silhouette. Extensive pulmonary interstitial fibrosis. No pneumothorax or pleural effusion identified. No focal alveolar consolidation. Osseous structures appear grossly intact. IMPRESSION: Enlarged cardiac silhouette.  Extensive pulmonary fibrosis. Electronically Signed   By: Layla Maw M.D.   On: 07/26/2022 14:26             LOS: 4 days      Sunnie Nielsen, DO [facility] 07/30/2022, 12:11 PM    Dictation software may have been used to generate the above note. Typos may occur and escape review in typed/dictated notes. Please contact Dr Lyn Hollingshead directly for clarity if needed.  Staff may message me via secure chat in Epic  but this may not receive an immediate response,  please page me for urgent matters!  If 7PM-7AM, please contact night coverage [email or web address]

## 2022-07-30 NOTE — Progress Notes (Signed)
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  She was recently seen at [facility] and was discharged to [facility] and was noted to have worsening hypoxemia subsequently brought to this hospital.  While at [facility] she was requiring 8 L nasal cannula and was saturating only 83% on the settings.  While in the intensive care unit she is slowly improved with mild tachypnea and now weaned down to 6 L of oxygen via nasal cannula.  She received Solu-Medrol on arrival and is also being treated empirically for pneumonia.  She had CT PE performed on arrival with findings of interstitial and airspace opacification with bronchiectasis bilaterally and pulmonary fibrosis.  07/28/22- patient is on 4L/min Sedan, she reports having good day get out of bed working wit PT.  Her Atrial fibrillation is stable. She is close to baseline in chronically ill state.  She is asking about going home.  She can continue dexamethasone and cellcept at current dose with bactrim SS TIW for PCP ppx.  She should also continue PT/OT upon dc.   07/29/22- patient resting in bed.  Appears to be at baseline clinically.  She's working with PT/OT.  She came from [place] and is being optimized for dc home. I spoke with daughter [name] on phone today.  07/30/22- patient stable overnight. Reducing  dexamethasone to 1mg  daily and plan to stay on this with mycophenolate BID for next 3 months. PCCM will sign off at this time and I can follow up withpatient on outpatient basis. Have ordered digoxin level and cxr today.   Lines/tubes : External Urinary Catheter (Active)     External Urinary Catheter (Active)  Collection Container Dedicated Suction Canister 07/27/22 0400  Suction (Verified suction is between 40-80 mmHg) Yes 07/26/22 2300  Securement Method Securing device (Describe) 07/27/22 0400  Site Assessment Clean;Dry 07/27/22 0400    Microbiology/Sepsis markers: Results for orders placed or performed during the hospital encounter of 07/26/22  Resp panel by RT-PCR (RSV, Flu A&B, Covid) Anterior Nasal Swab     Status: None   Collection Time:  07/26/22  3:52 PM   Specimen: Anterior Nasal Swab  Result Value Ref Range Status   SARS Coronavirus 2 by RT PCR NEGATIVE NEGATIVE Final    Comment: (NOTE) SARS-CoV-2 target nucleic acids are NOT DETECTED.  The SARS-CoV-2 RNA is generally detectable in upper respiratory specimens during the acute phase of infection. The lowest concentration of SARS-CoV-2 viral copies this assay can detect is 138 copies/mL. A negative result does not preclude SARS-Cov-2 infection and should not be used as the sole basis for treatment or other patient management decisions. A negative result may occur with  improper specimen collection/handling, submission of specimen other than nasopharyngeal swab, presence of viral mutation(s) within the areas targeted by this assay, and inadequate number of viral copies(<138 copies/mL). A negative result must be combined with clinical observations, patient history, and epidemiological information. The expected result is Negative.  Fact Sheet for Patients:  BloggerCourse.com  Fact Sheet for Healthcare Providers:  SeriousBroker.it  This test is no t yet approved or  cleared by the [place] FDA and  has been authorized for detection and/or diagnosis of SARS-CoV-2 by FDA under an Emergency Use Authorization (EUA). This EUA will remain  in effect (meaning this test can be used) for the duration of the COVID-19 declaration under Section 564(b)(1) of the Act, 21 U.S.C.section 360bbb-3(b)(1), unless the authorization is terminated  or revoked sooner.       Influenza A by PCR NEGATIVE NEGATIVE Final   Influenza B by PCR NEGATIVE NEGATIVE Final    Comment: (NOTE) The Xpert Xpress SARS-CoV-2/FLU/RSV plus assay is intended as an aid in the diagnosis of influenza from Nasopharyngeal swab specimens and should not be used as a sole basis for treatment. Nasal washings and aspirates are unacceptable for Xpert Xpress SARS-CoV-2/FLU/RSV testing.  Fact Sheet for Patients: BloggerCourse.com  Fact Sheet for Healthcare Providers: SeriousBroker.it  This test is not yet approved or cleared by the [place] FDA and has been authorized for detection and/or diagnosis of SARS-CoV-2 by FDA under an Emergency Use Authorization (EUA). This EUA will remain in effect (meaning this test can be used) for the duration of the COVID-19 declaration under Section 564(b)(1) of the Act, 21 U.S.C. section 360bbb-3(b)(1), unless the authorization is terminated or revoked.     Resp Syncytial Virus by PCR NEGATIVE NEGATIVE Final    Comment: (NOTE) Fact Sheet for Patients: BloggerCourse.com  Fact Sheet for Healthcare Providers: SeriousBroker.it  This test is not yet approved or cleared by the [place] FDA and has been authorized for detection and/or diagnosis of SARS-CoV-2 by FDA under an Emergency Use Authorization (EUA). This EUA will remain in effect (meaning this test can be used) for the duration of the COVID-19 declaration under Section 564(b)(1) of the Act, 21  U.S.C. section 360bbb-3(b)(1), unless the authorization is terminated or revoked.  Performed at [facility], [address] [place], [place] [address]   Culture, blood (Routine X 2) w Reflex to ID Panel     Status: None (Preliminary result)   Collection Time: 07/26/22  5:45 PM   Specimen: BLOOD  Result Value Ref Range Status   Specimen Description BLOOD RIGHT ANTECUBITAL  Final   Special Requests   Final    BOTTLES DRAWN AEROBIC AND ANAEROBIC Blood Culture adequate volume   Culture   Final    NO GROWTH 4 DAYS Performed at [facility], [address], [place], [place] [address]    Report Status PENDING  Incomplete  Culture, blood (Routine  X 2) w Reflex to ID Panel     Status: None (Preliminary result)   Collection Time: 07/26/22  5:45 PM   Specimen: BLOOD  Result Value Ref Range Status   Specimen Description BLOOD LEFT ANTECUBITAL  Final   Special Requests   Final    BOTTLES DRAWN AEROBIC AND ANAEROBIC Blood Culture adequate volume   Culture   Final    NO GROWTH 4 DAYS Performed at [facility], [address] [place], [place] [address]    Report Status PENDING  Incomplete  MRSA Next Gen by PCR, Nasal     Status: None   Collection Time: 07/26/22 10:40 PM   Specimen: Nasal Mucosa; Nasal Swab  Result Value Ref Range Status   MRSA by PCR Next Gen NOT DETECTED NOT DETECTED Final    Comment: (NOTE) The GeneXpert MRSA Assay (FDA approved for NASAL specimens only), is one component of a comprehensive MRSA colonization surveillance program. It is not intended to diagnose MRSA infection nor to guide or monitor treatment for MRSA infections. Test performance is not FDA approved in patients less than 40 years old. Performed at [facility], [address], [place], [place] [address]     Anti-infectives:  Anti-infectives (From admission, onward)    Start     Dose/Rate Route Frequency Ordered Stop   07/27/22 1800  cefTRIAXone (ROCEPHIN)  1 g in sodium chloride 0.9 % 100 mL IVPB  Status:  Discontinued        1 g 200 mL/hr over 30 Minutes Intravenous Every 24 hours 07/26/22 1955 07/27/22 0901   07/26/22 2045  azithromycin (ZITHROMAX) 500 mg in sodium chloride 0.9 % 250 mL IVPB  Status:  Discontinued        500 mg 250 mL/hr over 60 Minutes Intravenous Every 24 hours 07/26/22 2031 07/27/22 0901   07/26/22 2000  cefTRIAXone (ROCEPHIN) 1 g in sodium chloride 0.9 % 100 mL IVPB  Status:  Discontinued        1 g 200 mL/hr over 30 Minutes Intravenous Every 24 hours 07/26/22 1947 07/26/22 1955   07/26/22 1730  cefTRIAXone (ROCEPHIN) 2 g in sodium chloride 0.9 % 100 mL IVPB  Status:  Discontinued        2 g 200 mL/hr over 30 Minutes Intravenous Every 24 hours 07/26/22 1722 07/26/22 1908   07/26/22 1730  azithromycin (ZITHROMAX) 500 mg in sodium chloride 0.9 % 250 mL IVPB  Status:  Discontinued        500 mg 250 mL/hr over 60 Minutes Intravenous Every 24 hours 07/26/22 1722 07/26/22 1908   07/26/22 1715  azithromycin (ZITHROMAX) 500 mg in sodium chloride 0.9 % 250 mL IVPB  Status:  Discontinued        500 mg 250 mL/hr over 60 Minutes Intravenous  Once 07/26/22 1705 07/26/22 1722   07/26/22 1715  cefTRIAXone (ROCEPHIN) 1 g in sodium chloride 0.9 % 100 mL IVPB  Status:  Discontinued        1 g 200 mL/hr over 30 Minutes Intravenous  Once 07/26/22 1705 07/26/22 1722        Consults: Treatment Team:  Vida Rigger, MD    PAST MEDICAL HISTORY   Past Medical History:  Diagnosis Date   Anemia 10/28/2010   unspecified   Anxiety    Aortic valve sclerosis    Arthritis    Asthma    Chronic combined systolic and diastolic CHF (congestive heart failure)    echo- 10/22 LVEF 45-55%,  G1DD, moderately reduced RV fxn, mild aortic regurg/stenosis   Chronic kidney disease    Chronic Kidney Disease---Stage III   Coronary artery disease 10/28/2010   DDD (degenerative disc disease), lumbar 2006   Diabetes type 2, controlled 10/28/2010    Dyspnea    GERD (gastroesophageal reflux disease)    Gout 10/28/2010   Heart murmur    History of cardiac catheterization 2006   LAD 50%, D1 50% rest normal.    History of cardiovascular stress test 09/23/2009    lexiscan Normal Cardiolite test, EF 61%. LV size and function - normal   History of echocardiogram 05/27/2008   EF 55%; Mild aortic insufficiency with minimal aortic sclerosis. L vent normal.    Hyperlipidemia    Hyperlipidemia LDL goal <100 10/28/2010   managed by Dr. Burr Medico   Hypertension 10/28/2010   IPF (idiopathic pulmonary fibrosis)    Progressive, with UIP   Mild vitamin D deficiency 02/22/2011   Obesity (BMI 30-39.9) 10/28/2010   unspecified   Osteoarthritis 10/28/2010   bilateral knees, left hip   Personal history of colonic polyps 10/28/2010   Shortness of breath    Syncope 2013   Vitamin B 12 deficiency 10/28/2010     SURGICAL HISTORY   Past Surgical History:  Procedure Laterality Date   BACK SURGERY  2006   DDD   BREAST BIOPSY Right    neg   CARDIAC CATHETERIZATION  2006   EYE SURGERY Bilateral    Cataract Extraction with IOL   JOINT REPLACEMENT Left    Left Total Hip Replacement   JOINT REPLACEMENT     RIGHT 2005; LEFT 2 X 2004, 2011   REPLACEMENT TOTAL KNEE Left    REVISION TOTAL KNEE ARTHROPLASTY Left    Dr. Ernest Pine   TONSILLECTOMY     TOTAL HIP ARTHROPLASTY Right 12/12/2016   Procedure: TOTAL HIP ARTHROPLASTY ANTERIOR APPROACH;  Surgeon: Kennedy Bucker, MD;  Location: [facility] ORS;  Service: Orthopedics;  Laterality: Right;   TOTAL HIP ARTHROPLASTY Left 12/26/2013   Procedure: ARTHROPLASTY HIP TOTAL; Surgeon: Christell Faith, MD; Location: [facility] OR; Service: Orthopedics; Laterality: left,    TOTAL KNEE ARTHROPLASTY     both knees     FAMILY HISTORY   Family History  Problem Relation Age of Onset   Breast cancer Mother 53   Diabetes Mother    Stroke Mother    Stroke Brother    Breast cancer Cousin        pat cousin     SOCIAL  HISTORY   Social History   Tobacco Use   Smoking status: Former    Packs/day: 0.25    Years: 10.00    Additional pack years: 0.00    Total pack years: 2.50    Types: Cigarettes    Quit date: 08/18/1990    Years since quitting: 31.9   Smokeless tobacco: Never  Vaping Use   Vaping Use: Never used  Substance Use Topics   Alcohol use: No   Drug use: No     MEDICATIONS   Current Medication:  Current Facility-Administered Medications:    acetaminophen (TYLENOL) tablet 650 mg, 650 mg, Oral, Q6H PRN, 650 mg at 07/29/22 1304 **OR** acetaminophen (TYLENOL) suppository 650 mg, 650 mg, Rectal, Q6H PRN, Cox, Amy N, DO   ALPRAZolam (XANAX) tablet 0.25 mg, 0.25 mg, Oral, TID PRN, Agbata, Tochukwu, MD, 0.25 mg at 07/29/22 2128   amiodarone (PACERONE) tablet 200 mg, 200 mg, Oral, Daily, Karna Christmas, Zenora Karpel, MD, 200 mg at 07/29/22  7829   apixaban (ELIQUIS) tablet 2.5 mg, 2.5 mg, Oral, BID, Vida Rigger, MD, 2.5 mg at 07/29/22 2124   cholecalciferol (VITAMIN D3) 25 MCG (1000 UNIT) tablet 1,000 Units, 1,000 Units, Oral, Daily, Sunnie Nielsen, DO, 1,000 Units at 07/29/22 1000   dexamethasone (DECADRON) tablet 2 mg, 2 mg, Oral, Daily, Vida Rigger, MD, 2 mg at 07/29/22 1001   digoxin (LANOXIN) tablet 0.0625 mg, 0.0625 mg, Oral, Daily, Karna Christmas, Keiva Dina, MD, 0.0625 mg at 07/29/22 1002   feeding supplement (ENSURE ENLIVE / ENSURE PLUS) liquid 237 mL, 237 mL, Oral, BID BM, Agbata, Tochukwu, MD, 237 mL at 07/29/22 1545   gabapentin (NEURONTIN) capsule 100 mg, 100 mg, Oral, TID, Cox, Amy N, DO, 100 mg at 07/29/22 2124   insulin aspart (novoLOG) injection 0-5 Units, 0-5 Units, Subcutaneous, QHS, Cox, Amy N, DO   insulin aspart (novoLOG) injection 0-9 Units, 0-9 Units, Subcutaneous, TID WC, Cox, Amy N, DO, 7 Units at 07/29/22 1304   loratadine (CLARITIN) tablet 10 mg, 10 mg, Oral, Daily, Sunnie Nielsen, DO, 10 mg at 07/29/22 1000   magnesium oxide (MAG-OX) tablet 400 mg, 400 mg, Oral, Daily,  Sunnie Nielsen, DO, 400 mg at 07/29/22 5621   mirtazapine (REMERON) tablet 30 mg, 30 mg, Oral, QHS, Sunnie Nielsen, DO, 30 mg at 07/29/22 2124   mycophenolate (CELLCEPT) capsule 250 mg, 250 mg, Oral, BID, Cox, Amy N, DO, 250 mg at 07/29/22 2124   ondansetron (ZOFRAN) tablet 4 mg, 4 mg, Oral, Q6H PRN **OR** ondansetron (ZOFRAN) injection 4 mg, 4 mg, Intravenous, Q6H PRN, Cox, Amy N, DO   pantoprazole (PROTONIX) EC tablet 40 mg, 40 mg, Oral, Daily, Sunnie Nielsen, DO, 40 mg at 07/29/22 1000   senna-docusate (Senokot-S) tablet 1 tablet, 1 tablet, Oral, QHS PRN, Cox, Amy N, DO, 1 tablet at 07/27/22 1044   sertraline (ZOLOFT) tablet 100 mg, 100 mg, Oral, Daily, Sunnie Nielsen, DO, 100 mg at 07/29/22 1000   spironolactone (ALDACTONE) tablet 12.5 mg, 12.5 mg, Oral, Daily, Karna Christmas, Kashden Deboy, MD, 12.5 mg at 07/29/22 1002   torsemide (DEMADEX) tablet 10 mg, 10 mg, Oral, Daily, Karna Christmas, Renatha Rosen, MD, 10 mg at 07/29/22 1000    ALLERGIES   Morphine and related    REVIEW OF SYSTEMS     10 point review of systems is positive for dyspnea, hand and leg weakness which she states has been progressively getting worse over the last year.  PHYSICAL EXAMINATION   Vital Signs: Temp:  [98 F (36.7 C)-98.3 F (36.8 C)] 98 F (36.7 C) (04/21 0730) Pulse Rate:  [81-88] 82 (04/21 0730) Resp:  [20-22] 22 (04/21 0730) BP: (103-121)/(54-63) 121/63 (04/21 0730) SpO2:  [99 %-100 %] 100 % (04/21 0730)  GENERAL: Mild distress due to acutely ill status HEAD: Normocephalic, atraumatic.  EYES: Pupils equal, round, reactive to light.  No scleral icterus.  MOUTH: Moist mucosal membrane. NECK: Supple. No thyromegaly. No nodules. No JVD.  PULMONARY: Crepitations and rhonchi bilaterally CARDIOVASCULAR: S1 and S2. Regular rate and rhythm. No murmurs, rubs, or gallops.  GASTROINTESTINAL: Soft, nontender, non-distended. No masses. Positive bowel sounds. No hepatosplenomegaly.  MUSCULOSKELETAL: No swelling,  clubbing, or edema.  NEUROLOGIC: Mild distress due to acute illness SKIN:intact,warm,dry   PERTINENT DATA     Infusions:  Scheduled Medications:  amiodarone  200 mg Oral Daily   apixaban  2.5 mg Oral BID   cholecalciferol  1,000 Units Oral Daily   dexamethasone  2 mg Oral Daily   digoxin  0.0625 mg Oral Daily  feeding supplement  237 mL Oral BID BM   gabapentin  100 mg Oral TID   insulin aspart  0-5 Units Subcutaneous QHS   insulin aspart  0-9 Units Subcutaneous TID WC   loratadine  10 mg Oral Daily   magnesium oxide  400 mg Oral Daily   mirtazapine  30 mg Oral QHS   mycophenolate  250 mg Oral BID   pantoprazole  40 mg Oral Daily   sertraline  100 mg Oral Daily   spironolactone  12.5 mg Oral Daily   torsemide  10 mg Oral Daily   PRN Medications: acetaminophen **OR** acetaminophen, ALPRAZolam, ondansetron **OR** ondansetron (ZOFRAN) IV, senna-docusate Hemodynamic parameters:   Intake/Output: 04/20 0701 - 04/21 0700 In: 360 [P.O.:360] Out: 200 [Urine:200]  Ventilator  Settings:     LAB RESULTS:  Basic Metabolic Panel: Recent Labs  Lab 07/26/22 1341 07/27/22 0416 07/29/22 0543  NA 138 141 142  K 4.3 5.0 3.7  CL 95* 100 95*  CO2 34* 34* 39*  GLUCOSE 138* 135* 114*  BUN 22 26* 28*  CREATININE 0.79 0.96 0.85  CALCIUM 8.9 8.9 9.4    Liver Function Tests: No results for input(s): "AST", "ALT", "ALKPHOS", "BILITOT", "PROT", "ALBUMIN" in the last 168 hours. No results for input(s): "LIPASE", "AMYLASE" in the last 168 hours. No results for input(s): "AMMONIA" in the last 168 hours. CBC: Recent Labs  Lab 07/26/22 1341 07/27/22 0416  WBC 11.6* 8.2  HGB 10.3* 9.3*  HCT 35.3* 31.8*  MCV 98.6 98.1  PLT 226 215    Cardiac Enzymes: No results for input(s): "CKTOTAL", "CKMB", "CKMBINDEX", "TROPONINI" in the last 168 hours. BNP: Invalid input(s): "POCBNP" CBG: Recent Labs  Lab 07/29/22 0830 07/29/22 1249 07/29/22 1719 07/29/22 2122 07/30/22 0806   GLUCAP 139* 312* 83 104* 118*        IMAGING RESULTS:  Imaging: No results found. @ No results found.   ASSESSMENT AND PLAN    -Multidisciplinary rounds held today  Acute on chronic hypoxic Respiratory Failure -patient is close to baseline , now optimizing for dc home  -ILD - continue mycophenolate 250 bid and dexamethasone  daily   Chronic atrial fibrillation -Eliquis 2.5 mg twice daily, digoxin 0.65 mcg, amiodarone 200 p.o. daily -Patient does not tolerate beta-blockers -follow up cardiac enzymes as indicated ICU monitoring  Severe anxiety disorder -Patient has had a prescription of benzodiazepines before however becomes severely sedated and reports feelings of presyncope    GI/Nutrition GI PROPHYLAXIS as indicated DIET-->TF's as tolerated Constipation protocol as indicated  ENDO - ICU hypoglycemic\Hyperglycemia protocol -check FSBS per protocol   ELECTROLYTES -follow labs as needed -replace as needed -pharmacy consultation   DVT/GI PRX ordered -SCDs  TRANSFUSIONS AS NEEDED MONITOR FSBS ASSESS the need for LABS as needed    Vida Rigger, M.D.  Pulmonary & Critical Care Medicine      This document was prepared using Dragon voice recognition software and may include unintentional dictation errors.

## 2022-07-31 DIAGNOSIS — J9621 Acute and chronic respiratory failure with hypoxia: Secondary | ICD-10-CM | POA: Diagnosis not present

## 2022-07-31 LAB — BASIC METABOLIC PANEL
Anion gap: 8 (ref 5–15)
BUN: 39 mg/dL — ABNORMAL HIGH (ref 8–23)
CO2: 41 mmol/L — ABNORMAL HIGH (ref 22–32)
Calcium: 9.2 mg/dL (ref 8.9–10.3)
Chloride: 93 mmol/L — ABNORMAL LOW (ref 98–111)
Creatinine, Ser: 0.75 mg/dL (ref 0.44–1.00)
GFR, Estimated: >60 mL/min (ref >60)
Glucose, Bld: 110 mg/dL — ABNORMAL HIGH (ref 70–99)
Potassium: 3.7 mmol/L (ref 3.5–5.1)
Sodium: 142 mmol/L (ref 135–145)

## 2022-07-31 LAB — CBC
HCT: 32.9 % — ABNORMAL LOW (ref 36.0–46.0)
Hemoglobin: 9.5 g/dL — ABNORMAL LOW (ref 12.0–15.0)
MCH: 28.5 pg (ref 26.0–34.0)
MCHC: 28.9 g/dL — ABNORMAL LOW (ref 30.0–36.0)
MCV: 98.8 fL (ref 80.0–100.0)
Platelets: 221 10*3/uL (ref 150–400)
RBC: 3.33 MIL/uL — ABNORMAL LOW (ref 3.87–5.11)
RDW: 14 % (ref 11.5–15.5)
WBC: 10.1 10*3/uL (ref 4.0–10.5)
nRBC: 0 % (ref 0.0–0.2)

## 2022-07-31 LAB — GLUCOSE, CAPILLARY
Glucose-Capillary: 195 mg/dL — ABNORMAL HIGH (ref 70–99)
Glucose-Capillary: 213 mg/dL — ABNORMAL HIGH (ref 70–99)
Glucose-Capillary: 94 mg/dL (ref 70–99)
Glucose-Capillary: 114 mg/dL — ABNORMAL HIGH (ref 70–99)

## 2022-07-31 MED ORDER — BISACODYL 10 MG RE SUPP
10.0000 mg | Freq: Once | RECTAL | Status: AC
  Administered 2022-07-31: 10 mg via RECTAL
  Filled 2022-07-31: qty 1

## 2022-07-31 MED ORDER — SALINE SPRAY 0.65 % NA SOLN
1.0000 | NASAL | Status: DC | PRN
  Administered 2022-07-31: 1 via NASAL
  Filled 2022-07-31: qty 44

## 2022-07-31 NOTE — Progress Notes (Signed)
Mobility Specialist - Progress Note   07/31/22 1000  Mobility  Activity Transferred to/from [facility];Transferred from chair to bed  Level of Assistance Minimal assist, patient does 75% or more  Assistive Device Front wheel walker  Distance Ambulated (ft) 4 ft  Activity Response Tolerated well  $Mobility charge 1 Mobility     During mobility: 93 HR, 78-81% SpO2 Post-mobility: 88 HR, 94% SpO2   Pt sitting in recliner upon arrival, utilizing 5L. Pt STS with minA + RW. Pt SPT to [facility] for urinary out with assistance to navigate RW at pt request d/t increased weakness. Very fatigued with transfer. O2 desat to 78% with extended recovery time, so O2 was increased to 6L with sats returning to low 90s. Assist for peri-care. Pt SPT to bed with seated rest break before scoot transfer to [facility]. O2 desat again to 81% on 6L with extended recovery time. Max HR 113 bpm. Limited standing tolerance. Pt left in bed with alarm set, needs in reach. RN notified. Pt returned to 5L prior to exit.    Filiberto Pinks Mobility Specialist 07/31/22, 10:43 AM

## 2022-07-31 NOTE — Care Management Important Message (Signed)
Important Message  Patient Details  Name: Sierra Guzman MRN: [id number] Date of Birth: 04-19-1940   Medicare Important Message Given:  Yes     [name] 07/31/2022, 10:53 AM

## 2022-07-31 NOTE — Progress Notes (Signed)
Occupational Therapy Treatment Patient Details Name: LAYTOYA ION MRN: [id number] DOB: [birth year] Today's Date: 07/31/2022   History of present illness Ms. Dorrene Bently is a 82 year old female with history of hypertension, non-insulin-dependent diabetes mellitus, hyperlipidemia, neuropathy, depression, anxiety, interstitial lung disease on mycophenolate, GERD, who presents emergency department for chief concerns of increased oxygen requirement from [employer].   OT comments  Ms. Pourciau was seen for OT/PT co-tx session. Pt continues to be functionally limited by generalized weakness, decreased activity tolerance, and limited cardiopulmonary status. She fatigues quickly with activity this date and requires assist for ADL management as described below (See ADL section for additional detail). Pt progressing toward OT goals and continues to benefit from skilled OT services to maximize safety and functional independence upon hospital DC.    Recommendations for follow up therapy are one component of a multi-disciplinary discharge planning process, led by the attending physician.  Recommendations may be updated based on patient status, additional functional criteria and insurance authorization.    Assistance Recommended at Discharge Intermittent Supervision/Assistance  Patient can return home with the following  A lot of help with bathing/dressing/bathroom;A little help with walking and/or transfers;Assistance with cooking/housework;Assist for transportation;Help with stairs or ramp for entrance   Equipment Recommendations  BSC/3in1    Recommendations for Other Services      Precautions / Restrictions Precautions Precautions: Fall Restrictions Weight Bearing Restrictions: No       Mobility Bed Mobility Overal bed mobility: Needs Assistance Bed Mobility: Supine to Sit     Supine to sit: Mod assist, HOB elevated     General bed mobility comments: MOD A for trunk elevation and to advance  hips toward EOB.    Transfers Overall transfer level: Needs assistance Equipment used: Rolling walker (2 wheels) Transfers: Sit to/from Stand, Bed to chair/wheelchair/BSC Sit to Stand: Mod assist, Min assist, +2 physical assistance Stand pivot transfers: Max assist, +2 physical assistance         General transfer comment: +2 for safety and physical assist by end of session.     Balance Overall balance assessment: Needs assistance Sitting-balance support: Feet supported Sitting balance-Leahy Scale: Good     Standing balance support: Bilateral upper extremity supported, During functional activity, Reliant on assistive device for balance Standing balance-Leahy Scale: Poor Standing balance comment: Requires assist to maintain standing, fatigues quickly.                           ADL either performed or assessed with clinical judgement   ADL       Grooming: Sitting;Wash/dry face;Set up;Supervision/safety Grooming Details (indicate cue type and reason): Fatigues quickly while sitting EOB, able to sit on commode with pillow for back support and perform UB grooming while leaning back.                 Toilet Transfer: Maximal assistance;Stand-pivot;Rolling walker (2 wheels);+2 for safety/equipment Toilet Transfer Details (indicate cue type and reason): +2 for safety to stand pivot to [facility]. Pt initially able to perform with MOD A +2 but fatigues part way through transfer, ultimately requires MAX A to complete and safely lower onto [facility]. Noted desat from 92% to 85% with transfer from EOB to [facility]. Toileting- Clothing Manipulation and Hygiene: +2 for physical assistance;+2 for safety/equipment;Total assistance Toileting - Clothing Manipulation Details (indicate cue type and reason): TOTAL A to perform standing peri hygiene from [facility]. Pt attempts to perform herself but is too fatigued to stand without  MAX-TOTAL A and BUE support on RW..     Functional mobility during ADLs:  Maximal assistance;Total assistance;+2 for physical assistance;+2 for safety/equipment;Cueing for safety;Rolling walker (2 wheels) General ADL Comments: Increased knee buckling noted with functional mobility this date. Pt remains on 5L [place] t/o session. Desats noted with mobility attempts and pt requires ~5 min therapeutic rest break with cues for PLB to recover. When transferring from [facility] to recliner pt spO2 drops from 95% at rest to 81% once seated in chair. Care team notified via secure chat by PT while therapists are in room. Pt rebounds to 95% by end of session while seated in chair.    Extremity/Trunk Assessment Upper Extremity Assessment Upper Extremity Assessment: Generalized weakness   Lower Extremity Assessment Lower Extremity Assessment: Generalized weakness   Cervical / Trunk Assessment Cervical / Trunk Assessment: Normal    Vision Patient Visual Report: No change from baseline     Perception     Praxis      Cognition Arousal/Alertness: Awake/alert, Lethargic Behavior During Therapy: WFL for tasks assessed/performed, Flat affect Overall Cognitive Status: Within Functional Limits for tasks assessed                                 General Comments: Pleasant, eager to participate. Initially talkative at start of session but fatigues quickly and is less communicative by end of session.        Exercises Other Exercises Other Exercises: Pt educated on falls prevention strategies, energy conservation techniques, and routines modifications to support safety and functional independence during ADL management as described above.    Shoulder Instructions       General Comments      Pertinent Vitals/ Pain       Pain Assessment Pain Assessment: No/denies pain  Home Living                                          Prior Functioning/Environment              Frequency  Min 1X/week        Progress Toward Goals  OT Goals(current  goals can now be found in the care plan section)  Progress towards OT goals: Progressing toward goals  Acute Rehab OT Goals Patient Stated Goal: to get back to rehab and then to live with my daughter OT Goal Formulation: With patient Time For Goal Achievement: 08/16/2022 Potential to Achieve Goals: Fair  Plan Discharge plan remains appropriate;Frequency remains appropriate    Co-evaluation      Reason for Co-Treatment: For patient/therapist safety;To address functional/ADL transfers;Other (comment) PT goals addressed during session: Mobility/safety with mobility;Balance OT goals addressed during session: ADL's and self-care;Strengthening/ROM      AM-PAC OT "6 Clicks" Daily Activity     Outcome Measure   Help from another person eating meals?: None Help from another person taking care of personal grooming?: A Little Help from another person toileting, which includes using toliet, bedpan, or urinal?: A Lot Help from another person bathing (including washing, rinsing, drying)?: A Lot Help from another person to put on and taking off regular upper body clothing?: A Little Help from another person to put on and taking off regular lower body clothing?: A Lot 6 Click Score: 16    End of Session Equipment Utilized During Treatment: Rolling walker (  2 wheels)  OT Visit Diagnosis: Unsteadiness on feet (R26.81);Muscle weakness (generalized) (M62.81)   Activity Tolerance Patient limited by fatigue   Patient Left in chair;with call bell/phone within reach;with chair alarm set   Nurse Communication          Time: 4098-1191 OT Time Calculation (min): 30 min  Charges: OT General Charges $OT Visit: 1 Visit OT Treatments $Self Care/Home Management : 8-22 mins  Rockney Ghee, M.S., OTR/L 07/31/22, 3:35 PM

## 2022-07-31 NOTE — Progress Notes (Signed)
Physical Therapy Treatment Patient Details Name: Sierra Guzman MRN: [id number] DOB: 03/26/1941 Today's Date: 07/31/2022   History of Present Illness Sierra Guzman is a 82 year old female with history of hypertension, non-insulin-dependent diabetes mellitus, hyperlipidemia, neuropathy, depression, anxiety, interstitial lung disease on mycophenolate, GERD, who presents emergency department for chief concerns of increased oxygen requirement from [employer].    PT Comments    Overlap/Co-tx session with OT for pt safety due to increased assistance needed in standing and functional activities. Pt was very shaky this session in standing with B knees buckling continuously requiring ModA to prevent fall. Pt quick to fatigue and DOE with O2 sats declining from 92% to 81% after simple transfer. Pt required several minutes of seated rest to return to 92% while on 5L O2. Good demonstration of PLB technique and relaxation to restore SpO2. Will continue to see acutely per POC until d/c.   Recommendations for follow up therapy are one component of a multi-disciplinary discharge planning process, led by the attending physician.  Recommendations may be updated based on patient status, additional functional criteria and insurance authorization.  Follow Up Recommendations  Can patient physically be transported by private vehicle: No    Assistance Recommended at Discharge Frequent or constant Supervision/Assistance  Patient can return home with the following A little help with walking and/or transfers;A little help with bathing/dressing/bathroom;Help with stairs or ramp for entrance;Assist for transportation   Equipment Recommendations  None recommended by PT    Recommendations for Other Services       Precautions / Restrictions Precautions Precautions: Fall Restrictions Weight Bearing Restrictions: No     Mobility  Bed Mobility Overal bed mobility: Needs Assistance Bed Mobility: Supine to Sit      Supine to sit: Mod assist, HOB elevated     General bed mobility comments: MOD A for trunk elevation and to advance hips toward EOB.    Transfers Overall transfer level: Needs assistance Equipment used: Rolling walker (2 wheels) Transfers: Sit to/from Stand, Bed to chair/wheelchair/BSC Sit to Stand: Mod assist, Min assist, +2 physical assistance Stand pivot transfers: Max assist, +2 physical assistance         General transfer comment: +2 for safety and physical assist by end of session.    Ambulation/Gait Ambulation/Gait assistance: Mod assist Gait Distance (Feet): 3 Feet Assistive device: Rolling walker (2 wheels) Gait Pattern/deviations: Step-to pattern, Decreased step length - right, Decreased step length - left, Knees buckling Gait velocity: decr     General Gait Details: patient limited by sob with minimal mobility.   Stairs             Wheelchair Mobility    Modified Rankin (Stroke Patients Only)       Balance Overall balance assessment: Needs assistance Sitting-balance support: Feet supported Sitting balance-Leahy Scale: Good     Standing balance support: Bilateral upper extremity supported, During functional activity, Reliant on assistive device for balance Standing balance-Leahy Scale: Poor Standing balance comment: Requires assist to maintain standing, fatigues quickly, very shakey with B knees buckling continuously                            Cognition Arousal/Alertness: Awake/alert, Lethargic Behavior During Therapy: WFL for tasks assessed/performed, Flat affect Overall Cognitive Status: Within Functional Limits for tasks assessed  General Comments: Pleasant, eager to participate. Initially talkative at start of session but fatigues quickly and is less communicative by end of session.        Exercises      General Comments General comments (skin integrity, edema, etc.): Pt on  5L [place] with resting SpO2 at 92% dropping to 81% with simple transfers bed to chair requiring several minutes for recovery      Pertinent Vitals/Pain Pain Assessment Pain Assessment: No/denies pain    Home Living                          Prior Function            PT Goals (current goals can now be found in the care plan section)      Frequency    Min 2X/week      PT Plan Current plan remains appropriate    Co-evaluation PT/OT/SLP Co-Evaluation/Treatment: Yes Reason for Co-Treatment: For patient/therapist safety;To address functional/ADL transfers;Other (comment) PT goals addressed during session: Mobility/safety with mobility;Balance OT goals addressed during session: ADL's and self-care;Strengthening/ROM      AM-PAC PT "6 Clicks" Mobility   Outcome Measure  Help needed turning from your back to your side while in a flat bed without using bedrails?: A Lot Help needed moving from lying on your back to sitting on the side of a flat bed without using bedrails?: A Lot Help needed moving to and from a bed to a chair (including a wheelchair)?: A Little Help needed standing up from a chair using your arms (e.g., wheelchair or bedside chair)?: A Little Help needed to walk in hospital room?: A Lot Help needed climbing 3-5 steps with a railing? : Total 6 Click Score: 13    End of Session Equipment Utilized During Treatment: Oxygen (5L O2) Activity Tolerance: Patient limited by fatigue;Other (comment) (Limited by DOE and desaturation) Patient left: in chair;with call bell/phone within reach;with chair alarm set Nurse Communication: Mobility status;Other (comment) (Drop in O2 saturation) PT Visit Diagnosis: Other abnormalities of gait and mobility (R26.89);Muscle weakness (generalized) (M62.81);Difficulty in walking, not elsewhere classified (R26.2);Unsteadiness on feet (R26.81)     Time: 1355-1420 PT Time Calculation (min) (ACUTE ONLY): 25 min  Charges:   $Therapeutic Activity: 8-22 mins                    Zadie Cleverly, PTA   Jannet Askew 07/31/2022, 4:50 PM

## 2022-07-31 NOTE — TOC Progression Note (Addendum)
Transition of Care [facility]) - Progression Note    Patient Details  Name: Sierra Guzman MRN: [id number] Date of Birth: [birth year]  Transition of Care [facility]) CM/SW Contact  Chapman Fitch, RN Phone Number: 07/31/2022, 11:34 AM  Clinical Narrative:     Patient requiring 5L at rest.  Sats drop with exertion  Per Tiffany with [facility] they do not have a 10L concentrator available and will not be able to offer patient a bed back at discharge  Patient request that I speak with daughter Albertine Grates agreeable to bed search.  1st preference Mal[facility]  Erin with [facility] checking to see if they can obtain a 10 L concentrator    Update:  [facility] to order 10L concentrator and it will be there tomorrow or Wednesday.  Daughter [name] accepts bed. Holly with TOC to start auth    Barriers to Discharge: Other (must enter comment) (Unable to reach facility to confirm readmission needs over the weekend. 07/29/22.)  Expected Discharge Plan and Services                         DME Arranged: N/A DME Agency: NA       HH Arranged: NA HH Agency: NA         Social Determinants of Health (SDOH) Interventions SDOH Screenings   Food Insecurity: No Food Insecurity (07/31/2022)  Housing: Low Risk  (07/31/2022)  Transportation Needs: No Transportation Needs (07/31/2022)  Utilities: Not At Risk (07/31/2022)  Depression (PHQ2-9): Low Risk  (09/14/2020)  Recent Concern: Depression (PHQ2-9) - Medium Risk (08/10/2020)  Tobacco Use: Medium Risk (07/26/2022)    Readmission Risk Interventions    06/13/2021    1:06 PM 03/09/2021   12:37 PM  Readmission Risk Prevention Plan  Transportation Screening Complete Complete  PCP or Specialist Appt within 3-5 Days  Complete  HRI or Home Care Consult  Complete  Social Work Consult for Recovery Care Planning/Counseling  Complete  Palliative Care Screening  Not Applicable  Medication Review [employer]) Complete Complete  PCP or Specialist  appointment within 3-5 days of discharge Complete   HRI or Home Care Consult Complete   SW Recovery Care/Counseling Consult Complete   Palliative Care Screening Not Applicable   Skilled Nursing Facility Patient Refused

## 2022-07-31 NOTE — Progress Notes (Signed)
PROGRESS NOTE    Sierra Guzman   ZOX:[id number] DOB: May 21, 1940  DOA: 07/26/2022 Date of Service: 07/31/22 PCP: Ethelda Chick, MD     Brief Narrative / Hospital Course:  [name] is a 82 year old female with history of hypertension, non-insulin-dependent diabetes mellitus, hyperlipidemia, neuropathy, depression, anxiety, interstitial lung disease on mycophenolate, GERD, who presents emergency department for chief concerns of increased oxygen requirement from [facility]. Note chronic O2 use between 2.5-5 depending on the activity. Known pressure injury buttocks. C/o dysuria, subjective fever.  04/17: RR 15, heart rate 92, 115/60, SpO2 of 100% on 6 L [place]. DuoNebs one-time dose, Solu-Medrol 125 mg IV one-time dose, azithromycin and ceftriaxone for CAP.  CT PE with findings of interstitial and airspace opacification with bronchiectasis bilaterally and pulmonary fibrosis.  04/18: seen by pulmonology - added Lasix, started aldactone and torsemide po, continue steroids and CellCept, d/c antibiotics.  04/19: SpO2 100% 4L [place]. BCx NGx2d. Still needing BiPap qhs. PT/OT recs for SNF, TOC consulted 04/20: remains on 4L. About at baseline per pulmonary as of 04/20, but substantial weakness/desaturation/tachycardia w/ minimal exertion. TOC unable to reach her facility anyway to discuss possible accepting her back over the weekend will continue to monitor and O2 support, telemetry  04/21: reducing dexamethasone, stay on this and mycophenylate for next 3 mos, pulmonary s/o.  04/22: stable but on 5L O2, facility will not take her back at that level O2 support, will attempt to wean as able but may be here few more days depending on progress w/ this.   Consultants:  Pulmonology   Procedures: none      ASSESSMENT & PLAN:   Principal Problem:   Acute on chronic respiratory failure with hypoxia Active Problems:   SIRS (systemic inflammatory response syndrome)   Interstitial lung disease    Hypertension   Pressure injury of skin   Coronary artery disease   DDD (degenerative disc disease), cervical   Acute hypoxemic respiratory failure   Dysuria   Anxiety  Acute on chronic respiratory failure with hypoxia and hypercapnia - improved, close to baseline now  Likely d/t ILD and CHF Continue oxygen supplementation to maintain SpO2 greater than 92% BiPAP continuous initially -->  now on , w/ BiPap qhs/prn Treat underlying causes as below  facility will not take her back at 5L/min O2 support, will attempt to wean as able but may be here few more days depending on progress w/ this.    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 - improved  Last 2D echocardiogram from 03/24 showed an LVEF of 30 to 35% with LV global hypokinesis and grade 1 diastolic dysfunction S/p Lasix  Continue torsemide and spironolactone Optimize blood pressure control - BP occasionally soft, would avoid increase meds at this time  Monitor BMP   Interstitial lung disease Mycophenolate and dexamethasone per pulmonary for next 3 mos Will need pulmonary outpatient f/u   SIRS (systemic inflammatory response syndrome) Sepsis ruled out  Abx d/c   Chronic atrial fibrillation Continue amiodarone, digoxin and Eliquis as primary prevention for an acute stroke Not on beta-blockers d/t intolerance   Hypertension Hydralazine 5 mg IV every 8 hours as needed for SBP greater 175, 4 days ordered   Pressure injury of skin Present on admission Routine care    Coronary artery disease Aspirin 81 mg daily resumed   Anxiety Daughter reports the patient has done well with Valium in the past and that Ativan is too strong for her Diazepam  2.5 mg IV every 6 hours as needed for anxiety, 3 doses ordered.   diazepam can be increased to 5 mg if 2.5 mg is not sufficient for patient   Dysuria Patient endorses dysuria that started over the last 2 days UA was positive for leukocytes, trace.   NO  sepsis, No UCx collected, abx d/c, monitor symptoms    DDD (degenerative disc disease), cervical Pain control as needed     DVT prophylaxis: lovenox  Pertinent IV fluids/nutrition: no continuous IV fluids, heart healthy diet  Central lines / invasive devices: none   Code Status: DNR/DNI, per discussion with family and confirmed with daughter [name] by admitting hospitalist  ACP documentation reviewed: none on file   Current Admission Status: inpatient   TOC needs / Dispo plan: SNF Barriers to discharge / significant pending items: stable but on 5L O2, facility will not take her back at that level O2 support, will attempt to wean as able but may be here few more days depending on progress w/ this.              Subjective / Brief ROS:  Patient reports a bit anxious today, SOB w/ minimal movement even to [facility] about same as yesterday  Pain controlled - requests tylenol for her bedsore pain and requests padding for bedsores same as yesterday  Denies new weakness.  Tolerating diet.  Reports no concerns w/ urination/defecation.   Family Communication: none at this time     Objective Findings:  Vitals:   07/30/22 1605 07/30/22 2023 07/31/22 0427 07/31/22 0818  BP: 124/68 (!) 104/53 (!) 120/51 (!) 113/52  Pulse: 86 86 75 80  Resp: 20 20 20    Temp: 98.2 F (36.8 C) 98.7 F (37.1 C) 97.8 F (36.6 C) 98.3 F (36.8 C)  TempSrc:  Oral    SpO2: 100% 96% 100% 100%  Weight:      Height:        Intake/Output Summary (Last 24 hours) at 07/31/2022 1139 Last data filed at 07/31/2022 1005 Gross per 24 hour  Intake 480 ml  Output --  Net 480 ml    Filed Weights   07/26/22 2245  Weight: 50.4 kg    Examination:  Physical Exam Constitutional:      General: She is not in acute distress. Cardiovascular:     Rate and Rhythm: Normal rate. Rhythm irregular.  Pulmonary:     Breath sounds: Examination of the right-upper field reveals wheezing. Examination of the left-upper  field reveals wheezing. Examination of the right-middle field reveals wheezing and rales. Examination of the left-middle field reveals wheezing and rales. Examination of the right-lower field reveals decreased breath sounds. Examination of the left-lower field reveals decreased breath sounds. Decreased breath sounds, wheezing and rales present. Rhonchi: scattered. Musculoskeletal:     Right lower leg: No edema.     Left lower leg: No edema.  Skin:    General: Skin is warm and dry.  Neurological:     General: No focal deficit present.     Mental Status: She is alert and oriented to person, place, and time.  Psychiatric:        Mood and Affect: Mood normal.        Behavior: Behavior normal.          Scheduled Medications:   amiodarone  200 mg Oral Daily   apixaban  2.5 mg Oral BID   cholecalciferol  1,000 Units Oral Daily   dexamethasone  1 mg Oral  Daily   digoxin  0.0625 mg Oral Daily   feeding supplement  237 mL Oral BID BM   gabapentin  100 mg Oral TID   insulin aspart  0-5 Units Subcutaneous QHS   insulin aspart  0-9 Units Subcutaneous TID WC   loratadine  10 mg Oral Daily   magnesium oxide  400 mg Oral Daily   mirtazapine  30 mg Oral QHS   mycophenolate  250 mg Oral BID   pantoprazole  40 mg Oral Daily   sertraline  100 mg Oral Daily   spironolactone  12.5 mg Oral Daily   torsemide  10 mg Oral Daily    Continuous Infusions:   PRN Medications:  acetaminophen **OR** acetaminophen, ALPRAZolam, ondansetron **OR** ondansetron (ZOFRAN) IV, senna-docusate  Antimicrobials from admission:  Anti-infectives (From admission, onward)    Start     Dose/Rate Route Frequency Ordered Stop   07/27/22 1800  cefTRIAXone (ROCEPHIN) 1 g in sodium chloride 0.9 % 100 mL IVPB  Status:  Discontinued        1 g 200 mL/hr over 30 Minutes Intravenous Every 24 hours 07/26/22 1955 07/27/22 0901   07/26/22 2045  azithromycin (ZITHROMAX) 500 mg in sodium chloride 0.9 % 250 mL IVPB  Status:   Discontinued        500 mg 250 mL/hr over 60 Minutes Intravenous Every 24 hours 07/26/22 2031 07/27/22 0901   07/26/22 2000  cefTRIAXone (ROCEPHIN) 1 g in sodium chloride 0.9 % 100 mL IVPB  Status:  Discontinued        1 g 200 mL/hr over 30 Minutes Intravenous Every 24 hours 07/26/22 1947 07/26/22 1955   07/26/22 1730  cefTRIAXone (ROCEPHIN) 2 g in sodium chloride 0.9 % 100 mL IVPB  Status:  Discontinued        2 g 200 mL/hr over 30 Minutes Intravenous Every 24 hours 07/26/22 1722 07/26/22 1908   07/26/22 1730  azithromycin (ZITHROMAX) 500 mg in sodium chloride 0.9 % 250 mL IVPB  Status:  Discontinued        500 mg 250 mL/hr over 60 Minutes Intravenous Every 24 hours 07/26/22 1722 07/26/22 1908   07/26/22 1715  azithromycin (ZITHROMAX) 500 mg in sodium chloride 0.9 % 250 mL IVPB  Status:  Discontinued        500 mg 250 mL/hr over 60 Minutes Intravenous  Once 07/26/22 1705 07/26/22 1722   07/26/22 1715  cefTRIAXone (ROCEPHIN) 1 g in sodium chloride 0.9 % 100 mL IVPB  Status:  Discontinued        1 g 200 mL/hr over 30 Minutes Intravenous  Once 07/26/22 1705 07/26/22 1722           Data Reviewed:  I have personally reviewed the following...  CBC: Recent Labs  Lab 07/26/22 1341 07/27/22 0416 07/31/22 0456  WBC 11.6* 8.2 10.1  HGB 10.3* 9.3* 9.5*  HCT 35.3* 31.8* 32.9*  MCV 98.6 98.1 98.8  PLT 226 215 221    Basic Metabolic Panel: Recent Labs  Lab 07/26/22 1341 07/27/22 0416 07/29/22 0543 07/31/22 0456  NA 138 141 142 142  K 4.3 5.0 3.7 3.7  CL 95* 100 95* 93*  CO2 34* 34* 39* 41*  GLUCOSE 138* 135* 114* 110*  BUN 22 26* 28* 39*  CREATININE 0.79 0.96 0.85 0.75  CALCIUM 8.9 8.9 9.4 9.2    GFR: Estimated Creatinine Clearance: 43.1 mL/min (by C-G formula based on SCr of 0.75 mg/dL). Liver Function Tests: No results for input(s): "  AST", "ALT", "ALKPHOS", "BILITOT", "PROT", "ALBUMIN" in the last 168 hours. No results for input(s): "LIPASE", "AMYLASE" in the last  168 hours. No results for input(s): "AMMONIA" in the last 168 hours. Coagulation Profile: Recent Labs  Lab 07/27/22 0416  INR 1.2    Cardiac Enzymes: No results for input(s): "CKTOTAL", "CKMB", "CKMBINDEX", "TROPONINI" in the last 168 hours. BNP (last 3 results) No results for input(s): "PROBNP" in the last 8760 hours. HbA1C: No results for input(s): "HGBA1C" in the last 72 hours.  CBG: Recent Labs  Lab 07/30/22 1138 07/30/22 1635 07/30/22 2322 07/31/22 0821 07/31/22 1133  GLUCAP 235* 149* 198* 195* 213*    Lipid Profile: No results for input(s): "CHOL", "HDL", "LDLCALC", "TRIG", "CHOLHDL", "LDLDIRECT" in the last 72 hours. Thyroid Function Tests: No results for input(s): "TSH", "T4TOTAL", "FREET4", "T3FREE", "THYROIDAB" in the last 72 hours. Anemia Panel: No results for input(s): "VITAMINB12", "FOLATE", "FERRITIN", "TIBC", "IRON", "RETICCTPCT" in the last 72 hours. Most Recent Urinalysis On File:     Component Value Date/Time   COLORURINE YELLOW (A) 07/26/2022 1745   APPEARANCEUR CLEAR (A) 07/26/2022 1745   LABSPEC 1.020 07/26/2022 1745   PHURINE 6.0 07/26/2022 1745   GLUCOSEU >=500 (A) 07/26/2022 1745   HGBUR NEGATIVE 07/26/2022 1745   BILIRUBINUR NEGATIVE 07/26/2022 1745   KETONESUR NEGATIVE 07/26/2022 1745   PROTEINUR NEGATIVE 07/26/2022 1745   NITRITE NEGATIVE 07/26/2022 1745   LEUKOCYTESUR TRACE (A) 07/26/2022 1745   Sepsis Labs: @LABRCNTIP (procalcitonin:4,lacticidven:4) Microbiology: Recent Results (from the past 240 hour(s))  Resp panel by RT-PCR (RSV, Flu A&B, Covid) Anterior Nasal Swab     Status: None   Collection Time: 07/26/22  3:52 PM   Specimen: Anterior Nasal Swab  Result Value Ref Range Status   SARS Coronavirus 2 by RT PCR NEGATIVE NEGATIVE Final    Comment: (NOTE) SARS-CoV-2 target nucleic acids are NOT DETECTED.  The SARS-CoV-2 RNA is generally detectable in upper respiratory specimens during the acute phase of infection. The  lowest concentration of SARS-CoV-2 viral copies this assay can detect is 138 copies/mL. A negative result does not preclude SARS-Cov-2 infection and should not be used as the sole basis for treatment or other patient management decisions. A negative result may occur with  improper specimen collection/handling, submission of specimen other than nasopharyngeal swab, presence of viral mutation(s) within the areas targeted by this assay, and inadequate number of viral copies(<138 copies/mL). A negative result must be combined with clinical observations, patient history, and epidemiological information. The expected result is Negative.  Fact Sheet for Patients:  BloggerCourse.com  Fact Sheet for Healthcare Providers:  SeriousBroker.it  This test is no t yet approved or cleared by the [place] FDA and  has been authorized for detection and/or diagnosis of SARS-CoV-2 by FDA under an Emergency Use Authorization (EUA). This EUA will remain  in effect (meaning this test can be used) for the duration of the COVID-19 declaration under Section 564(b)(1) of the Act, 21 U.S.C.section 360bbb-3(b)(1), unless the authorization is terminated  or revoked sooner.       Influenza A by PCR NEGATIVE NEGATIVE Final   Influenza B by PCR NEGATIVE NEGATIVE Final    Comment: (NOTE) The Xpert Xpress SARS-CoV-2/FLU/RSV plus assay is intended as an aid in the diagnosis of influenza from Nasopharyngeal swab specimens and should not be used as a sole basis for treatment. Nasal washings and aspirates are unacceptable for Xpert Xpress SARS-CoV-2/FLU/RSV testing.  Fact Sheet for Patients: BloggerCourse.com  Fact Sheet for Healthcare Providers: SeriousBroker.it  This test is not yet approved or cleared by the [place] and has been authorized for detection and/or diagnosis of SARS-CoV-2 by FDA under  an Emergency Use Authorization (EUA). This EUA will remain in effect (meaning this test can be used) for the duration of the COVID-19 declaration under Section 564(b)(1) of the Act, 21 U.S.C. section 360bbb-3(b)(1), unless the authorization is terminated or revoked.     Resp Syncytial Virus by PCR NEGATIVE NEGATIVE Final    Comment: (NOTE) Fact Sheet for Patients: BloggerCourse.com  Fact Sheet for Healthcare Providers: SeriousBroker.it  This test is not yet approved or cleared by the [place] FDA and has been authorized for detection and/or diagnosis of SARS-CoV-2 by FDA under an Emergency Use Authorization (EUA). This EUA will remain in effect (meaning this test can be used) for the duration of the COVID-19 declaration under Section 564(b)(1) of the Act, 21 U.S.C. section 360bbb-3(b)(1), unless the authorization is terminated or revoked.  Performed at [facility], [address] [place], [place] [address]   Culture, blood (Routine X 2) w Reflex to ID Panel     Status: None (Preliminary result)   Collection Time: 07/26/22  5:45 PM   Specimen: BLOOD  Result Value Ref Range Status   Specimen Description BLOOD RIGHT ANTECUBITAL  Final   Special Requests   Final    BOTTLES DRAWN AEROBIC AND ANAEROBIC Blood Culture adequate volume   Culture   Final    NO GROWTH 4 DAYS Performed at [facility], [address], [place], [place] [address]    Report Status PENDING  Incomplete  Culture, blood (Routine X 2) w Reflex to ID Panel     Status: None (Preliminary result)   Collection Time: 07/26/22  5:45 PM   Specimen: BLOOD  Result Value Ref Range Status   Specimen Description BLOOD LEFT ANTECUBITAL  Final   Special Requests   Final    BOTTLES DRAWN AEROBIC AND ANAEROBIC Blood Culture adequate volume   Culture   Final    NO GROWTH 4 DAYS Performed at [facility], [address] [place], [place]  [address]    Report Status PENDING  Incomplete  MRSA Next Gen by PCR, Nasal     Status: None   Collection Time: 07/26/22 10:40 PM   Specimen: Nasal Mucosa; Nasal Swab  Result Value Ref Range Status   MRSA by PCR Next Gen NOT DETECTED NOT DETECTED Final    Comment: (NOTE) The GeneXpert MRSA Assay (FDA approved for NASAL specimens only), is one component of a comprehensive MRSA colonization surveillance program. It is not intended to diagnose MRSA infection nor to guide or monitor treatment for MRSA infections. Test performance is not FDA approved in patients less than 41 years old. Performed at [facility], [address] [place], [place] [address]       Radiology Studies last 3 days: [facility] 1 View  Result Date: 07/30/2022 CLINICAL DATA:  [id number] ILD (interstitial lung disease) [id number] EXAM: PORTABLE CHEST 1 VIEW COMPARISON:  07/26/2022 FINDINGS: Cardiomegaly. Aortic atherosclerosis. Extensive findings of interstitial lung disease and pulmonary fibrosis. No definite superimposed airspace consolidation. No pleural effusion or pneumothorax. IMPRESSION: Extensive findings of interstitial lung disease and pulmonary fibrosis. No definite superimposed airspace consolidation. Electronically Signed   By: Duanne Guess D.O.   On: 07/30/2022 17:30             LOS: 5 days      Sunnie Nielsen, DO  [facility] 07/31/2022, 11:39 AM    Dictation software may have been used to generate the above note. Typos may occur and escape review in typed/dictated notes. Please contact Dr Lyn Hollingshead directly for clarity if needed.  Staff may message me via secure chat in Epic  but this may not receive an immediate response,  please page me for urgent matters!  If 7PM-7AM, please contact night coverage [email or web address]

## 2022-08-01 DIAGNOSIS — J9621 Acute and chronic respiratory failure with hypoxia: Secondary | ICD-10-CM | POA: Diagnosis not present

## 2022-08-01 LAB — GLUCOSE, CAPILLARY: Glucose-Capillary: 108 mg/dL — ABNORMAL HIGH (ref 70–99)

## 2022-08-01 IMAGING — CT CT CERVICAL SPINE W/O CM
3 of 4 series · 14 of 35 positions shown, 17 images · non-contrast
Comparison: None Available.

CLINICAL DATA: Head trauma, moderate-severe; Neck trauma (Age >=
65y)



[Series 6: sagittal bone · sagittal · 0.30mm/px · 5 of 74 slices shown, 6 images]
[im 25/74  bone]
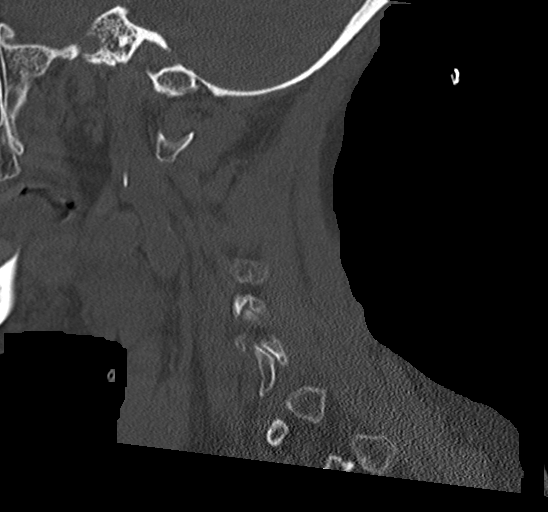
[im 31/74  bone]
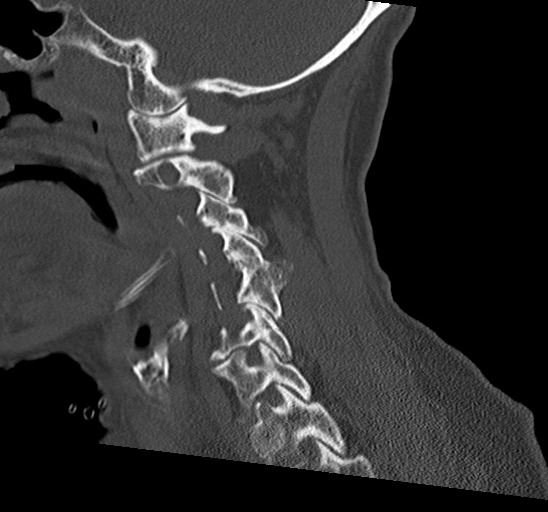
[im 37/74  soft-tissue]
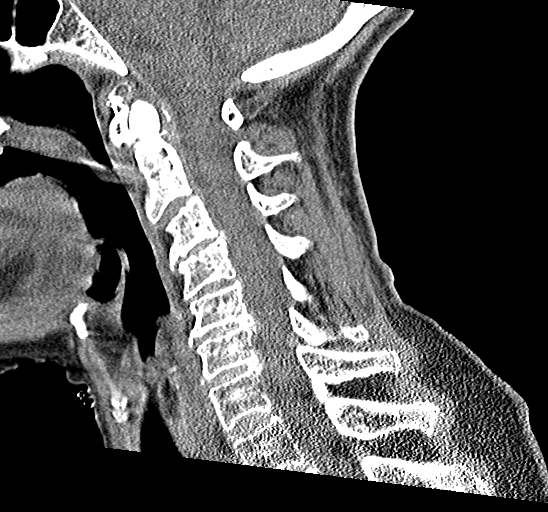
[im 37/74  bone]
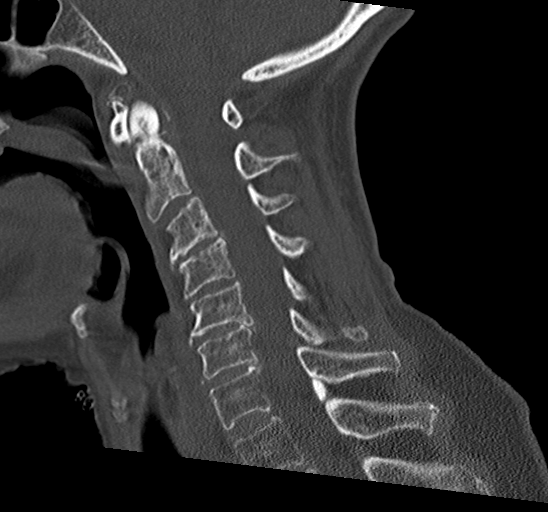
[im 43/74  bone]
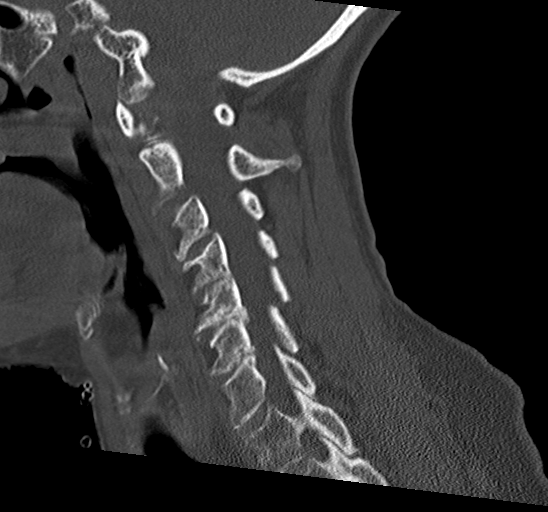
[im 49/74  bone]
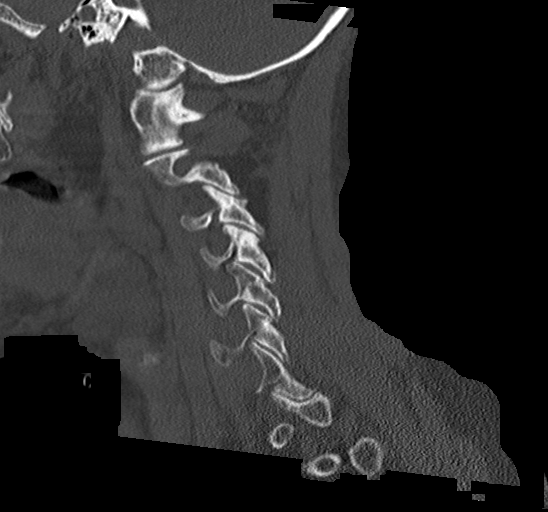

[Series 7: coronal bone · coronal · 0.29mm/px · 3 of 84 slices shown]
[im 17/84  bone]
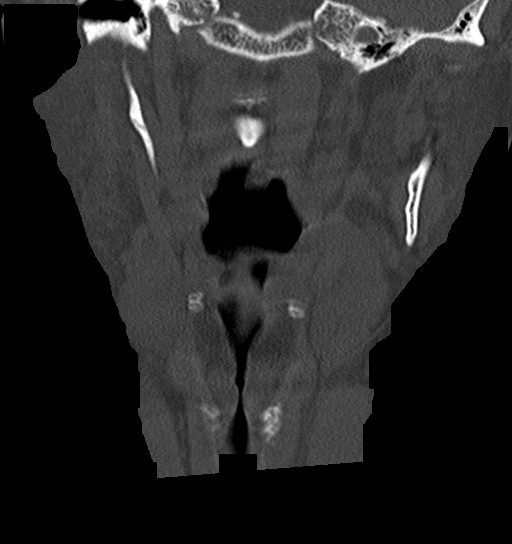
[im 34/84  bone]
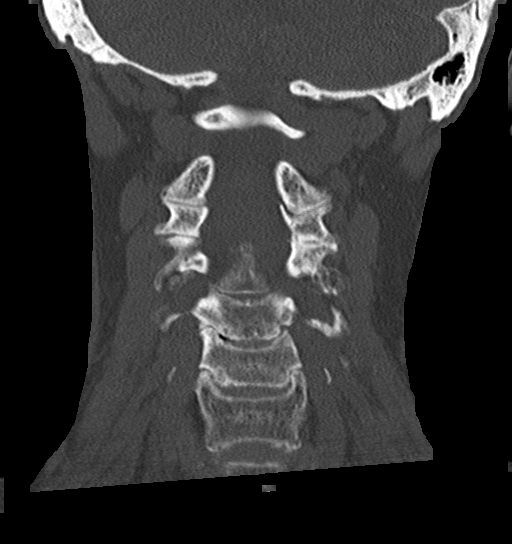
[im 50/84  bone]
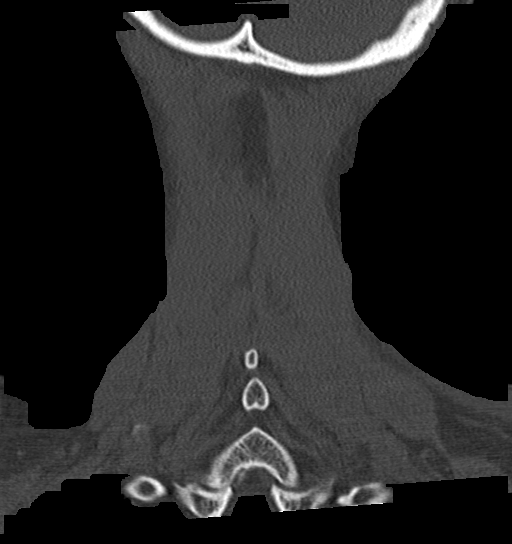

[Series 9: orthogonal bone · axial · 0.21mm/px · z∈[-193,-114]mm · 6 of 87 slices shown, 8 images]
[im 13/87  soft-tissue]
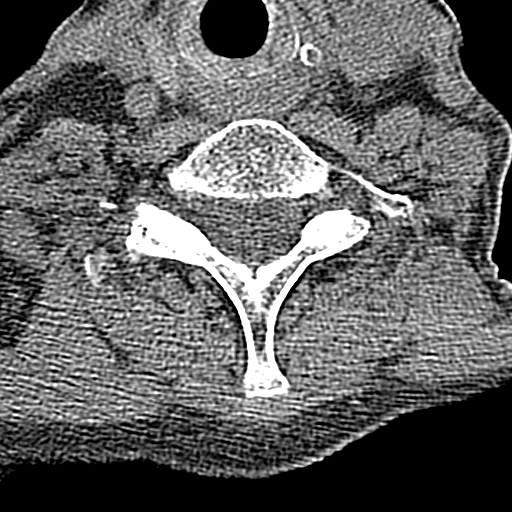
[im 13/87  bone]
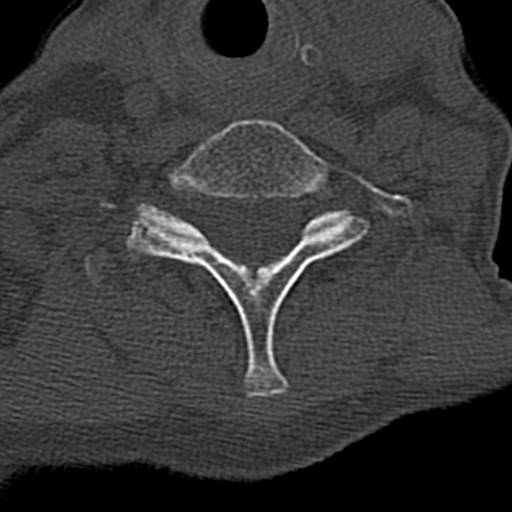
[im 25/87  bone]
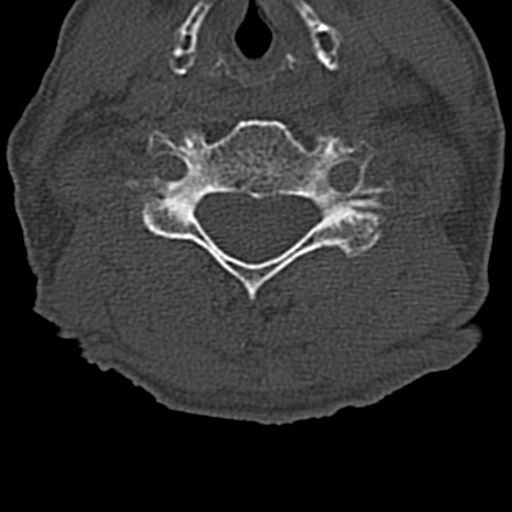
[im 37/87  bone]
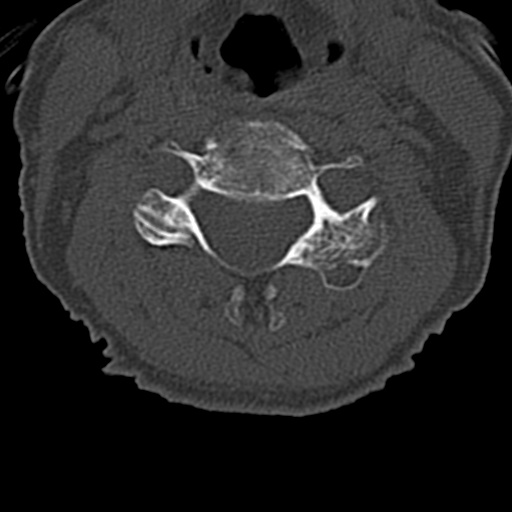
[im 50/87  bone]
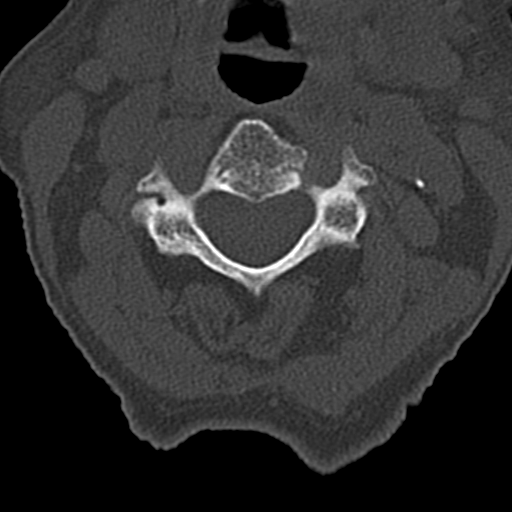
[im 62/87  soft-tissue]
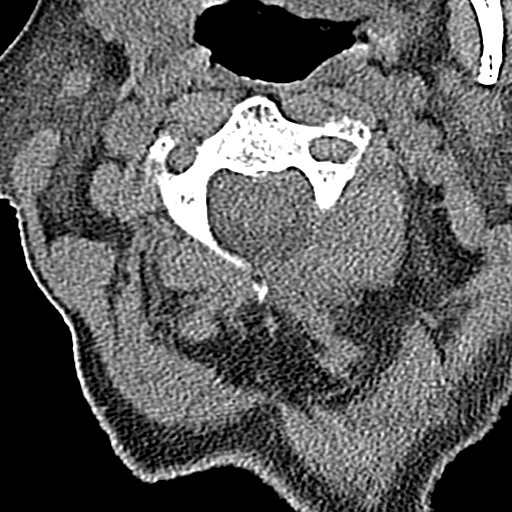
[im 62/87  bone]
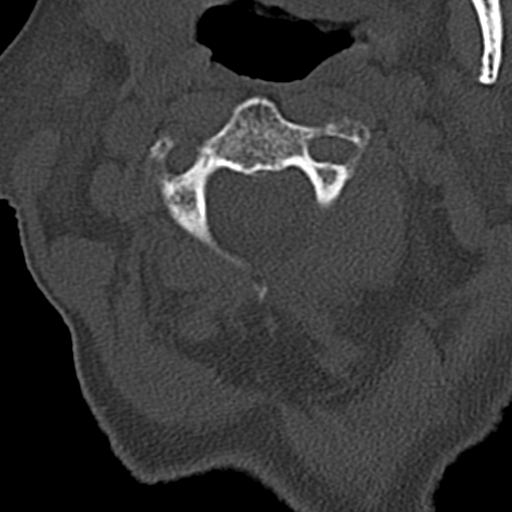
[im 74/87  bone]
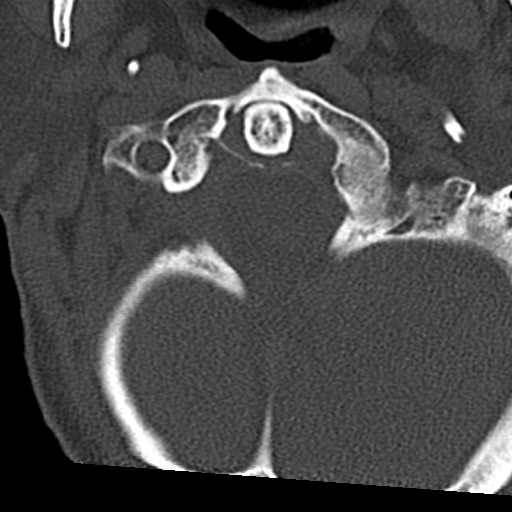

[14 of 35 positions shown; findings below may reference images not displayed]

FINDINGS: CT HEAD FINDINGS

Brain: No evidence of acute infarction, hemorrhage, hydrocephalus,
extra-axial collection or mass lesion/mass effect. Patchy white
matter hypodensities, nonspecific but compatible with chronic
microvascular ischemic disease. Cerebral atrophy, mild for age.

Vascular: Calcific intracranial atherosclerosis. No hyperdense
vessel identified.

Skull: No acute fracture.

Sinuses/Orbits: Clear sinuses.  No acute orbital findings.

Other: No mastoid effusions.

CT CERVICAL SPINE FINDINGS

Alignment: Mild reversal the normal cervical lordosis. No
substantial sagittal subluxation.

Skull base and vertebrae: Vertebral body heights are maintained. No
evidence of acute fracture. Diffuse osteopenia.

Soft tissues and spinal canal: No prevertebral fluid or swelling. No
visible canal hematoma.

Disc levels: Multilevel degenerative disc disease as well as facet
arthropathy with fusion across the left C4-C5 facet joint.

Upper chest: Partially imaged biapical pleuroparenchymal scarring.
IMPRESSION: 1. No evidence of acute intracranial abnormality.
2. No evidence of acute fracture or traumatic malalignment in the
cervical spine.

## 2022-08-01 IMAGING — CT CT L SPINE W/O CM
3 of 4 series · 12 of 33 positions shown, 13 images · non-contrast
Comparison: No recent imaging.nn

CLINICAL DATA: Back trauma, .



[Series 6: l spine soft · axial · 0.38mm/px · z∈[-789,-591]mm · 4 of 144 slices shown, 5 images]
[im 23/144  soft-tissue]
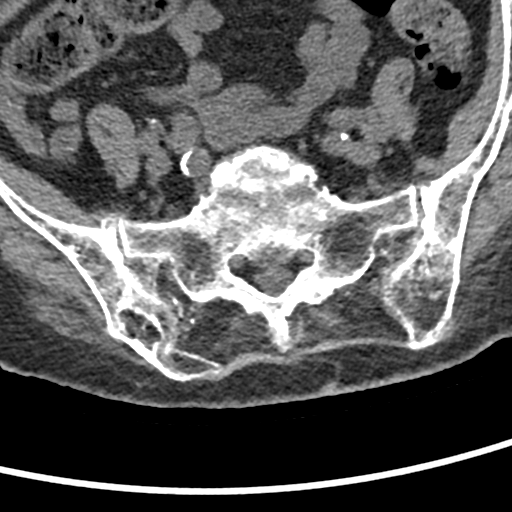
[im 23/144  bone]
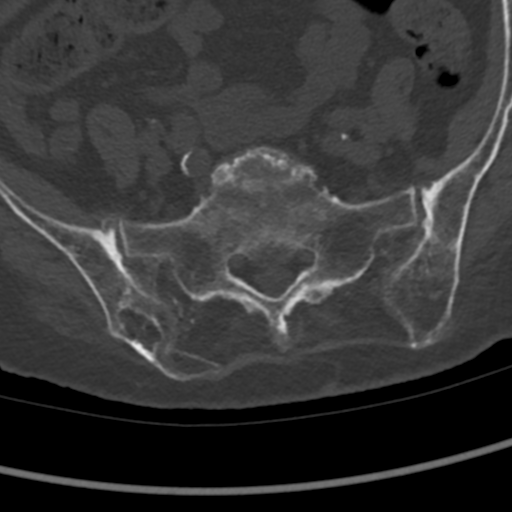
[im 56/144  bone]
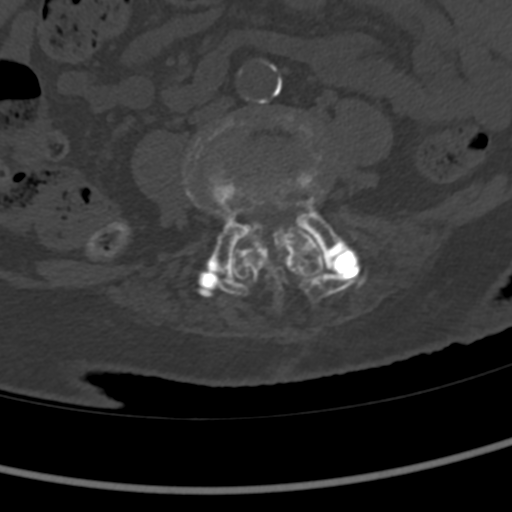
[im 89/144  bone]
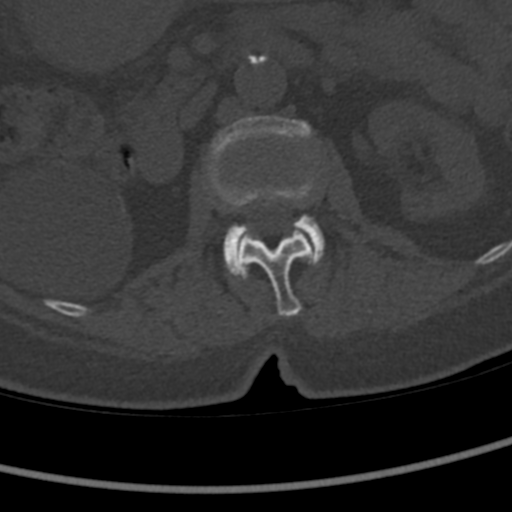
[im 122/144  bone]
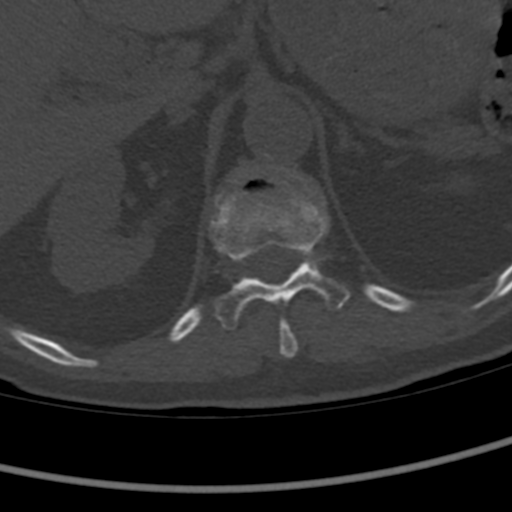

[Series 7: sagittal bone · sagittal · 0.37mm/px · 5 of 75 slices shown]
[im 13/75  bone]
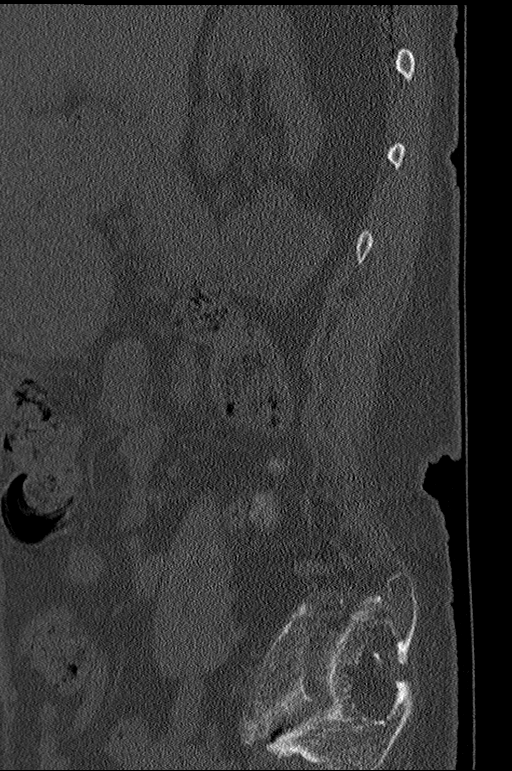
[im 25/75  bone]
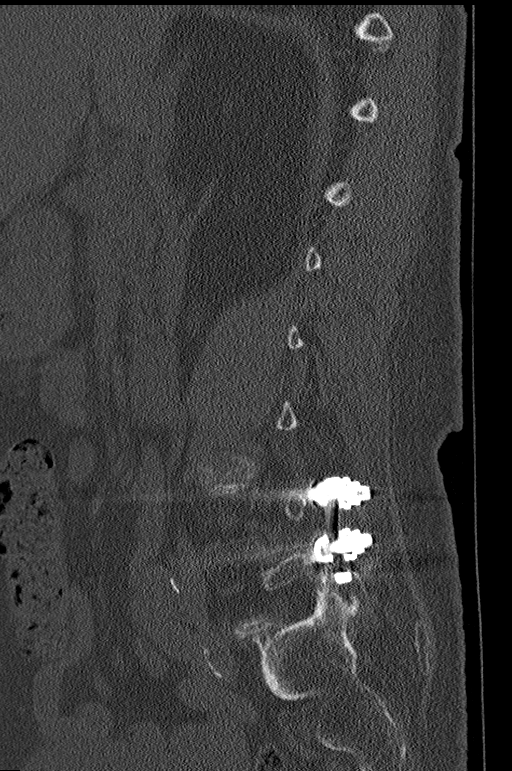
[im 38/75  bone]
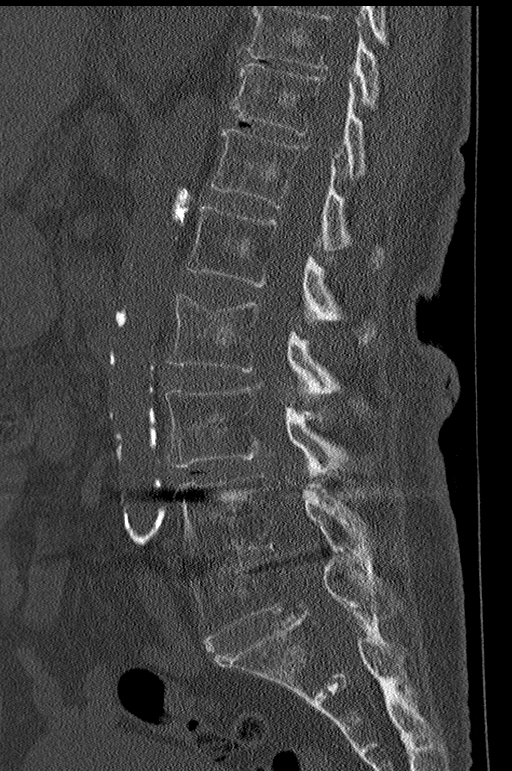
[im 50/75  bone]
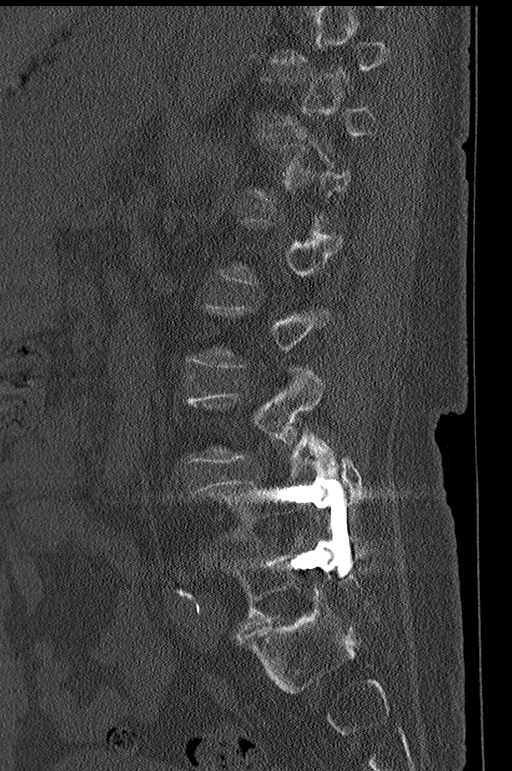
[im 62/75  bone]
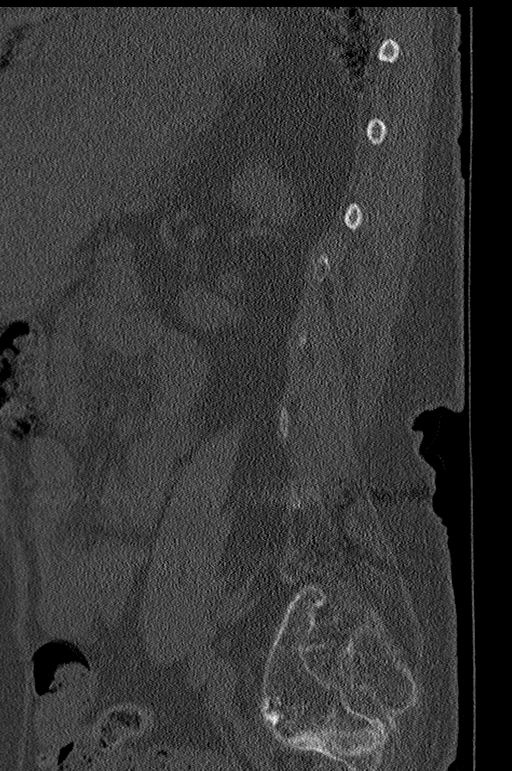

[Series 9: coronal bone · coronal · 0.30mm/px · 3 of 74 slices shown]
[im 15/74  bone]
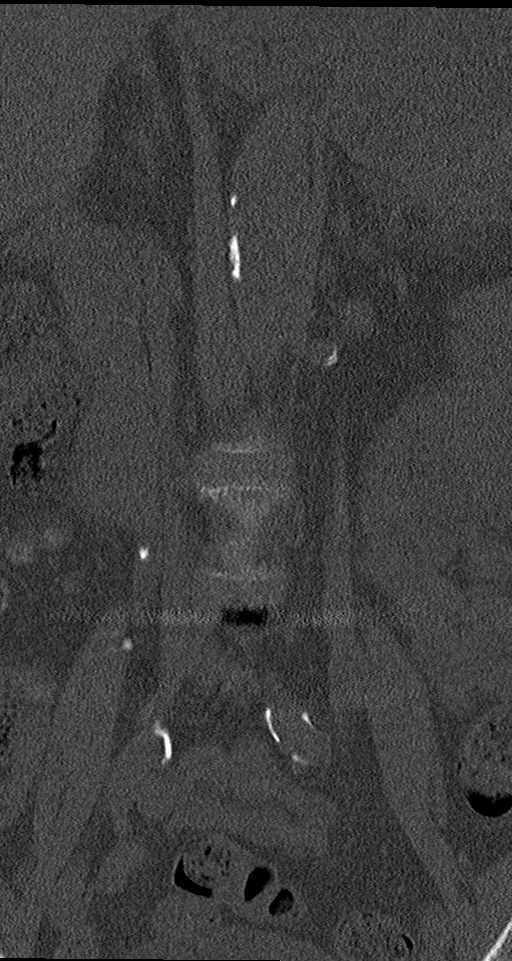
[im 30/74  bone]
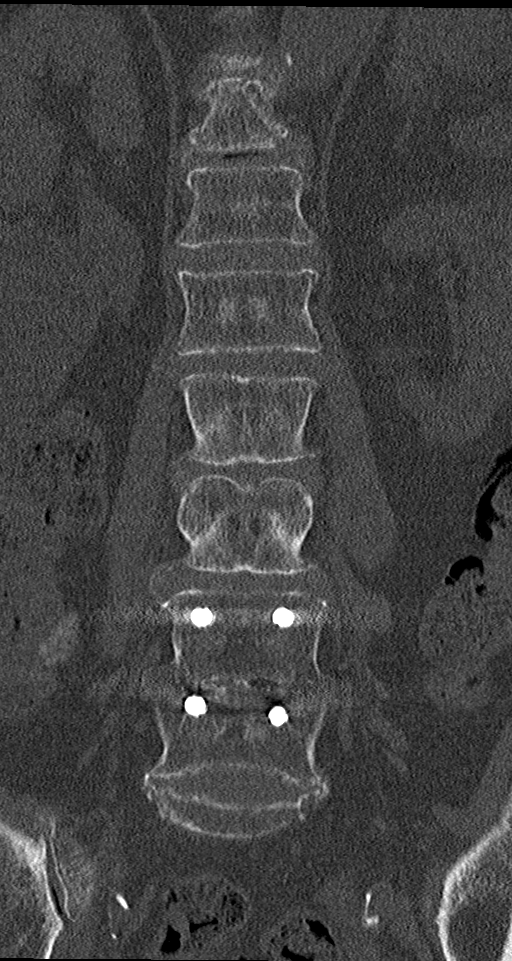
[im 44/74  bone]
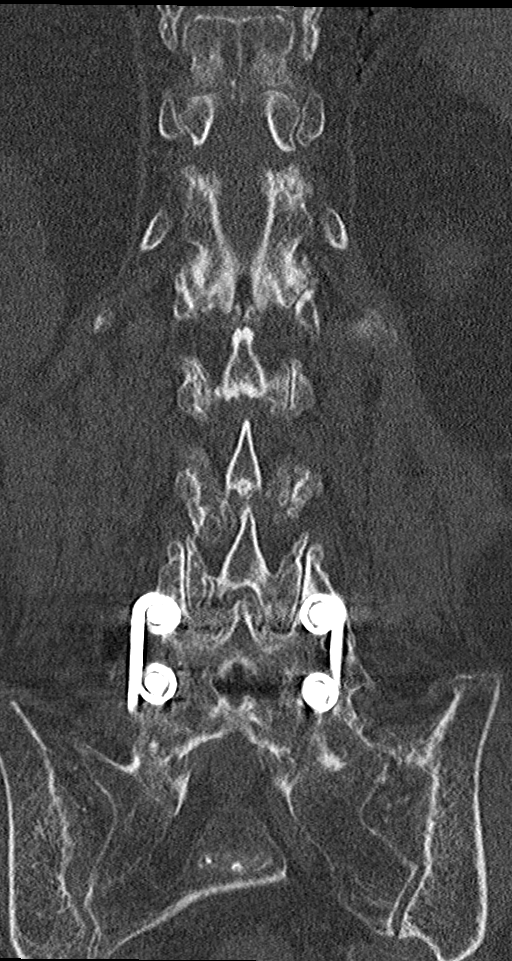

[12 of 33 positions shown; findings below may reference images not displayed]

FINDINGS: Segmentation: 5 lumbar type vertebrae.

Alignment: Normal.

Vertebrae: Bipedicular screws and rod fixation at L4-L5. There is a
lucent lesion in the posterior aspect of the right ilium measuring
approximately 2.4 x 1.6 x 2 point cm.

No acute fracture or traumatic subluxation. Mild superior endplate
height loss of L2 vertebral body.

Paraspinal and other soft tissues: Advanced atherosclerotic
calcification of abdominal aorta and branch vessels which are
however normal in caliber. Large exophytic cyst in the lower pole of
the right kidney. Paraspinal soft tissues are within normal limits.

Disc levels:

T12-L1: Significant spinal canal or neural foraminal stenosis.

L1-L2: Mild broad-based disc bulge with narrowing of lateral
recesses. Mild facet joint arthropathy. No significant neural
foraminal stenosis.

L2-L3: Broad-based disc bulge with moderate bilateral recess
stenosis. Moderate facet joint arthropathy. No significant neural
foraminal stenosis.

L3-L4: Disc osteophyte complex with marked bilateral lateral recess
stenosis. Moderate to severe facet joint arthropathy. No significant
neural foraminal stenosis.

L4-L5: Disc osteophyte complex with mild central canal stenosis.
Heterotopic ossification. No appreciable neural foraminal stenosis.

L5-S1: Disc osteophyte complex with moderate spinal canal stenosis.
Moderate facet joint arthropathy. No significant neural foraminal
stenosis.
IMPRESSION: 1.  No evidence of acute fracture or traumatic subluxation.

2. Postsurgical changes with posterior spinal fusion at L4-L5 with
bipedicular screws. The hardware is intact.

3. Multilevel degenerate disc disease at L2-L3, L3-L4 with narrowing
of lateral recesses and encroachment of the descending nerve roots.
Disc osteophyte complex with moderate spinal canal stenosis at
L5-S1.

4. Lytic lesion in right ilium measuring approximately 2.4 x 1.6 x
2.9 cm. Further evaluation with bone scan would be helpful.
Correlate with prior clinical history.

## 2022-08-01 IMAGING — CR DG HIP (WITH OR WITHOUT PELVIS) 2-3V*R*
3 series · 3 of 3 positions shown · non-contrast
Comparison: 12/12/2016

CLINICAL DATA: Fall.  Right hip pain

EXAM:
DG HIP (WITH OR WITHOUT PELVIS) 2-3V RIGHT

[pelvis ap]
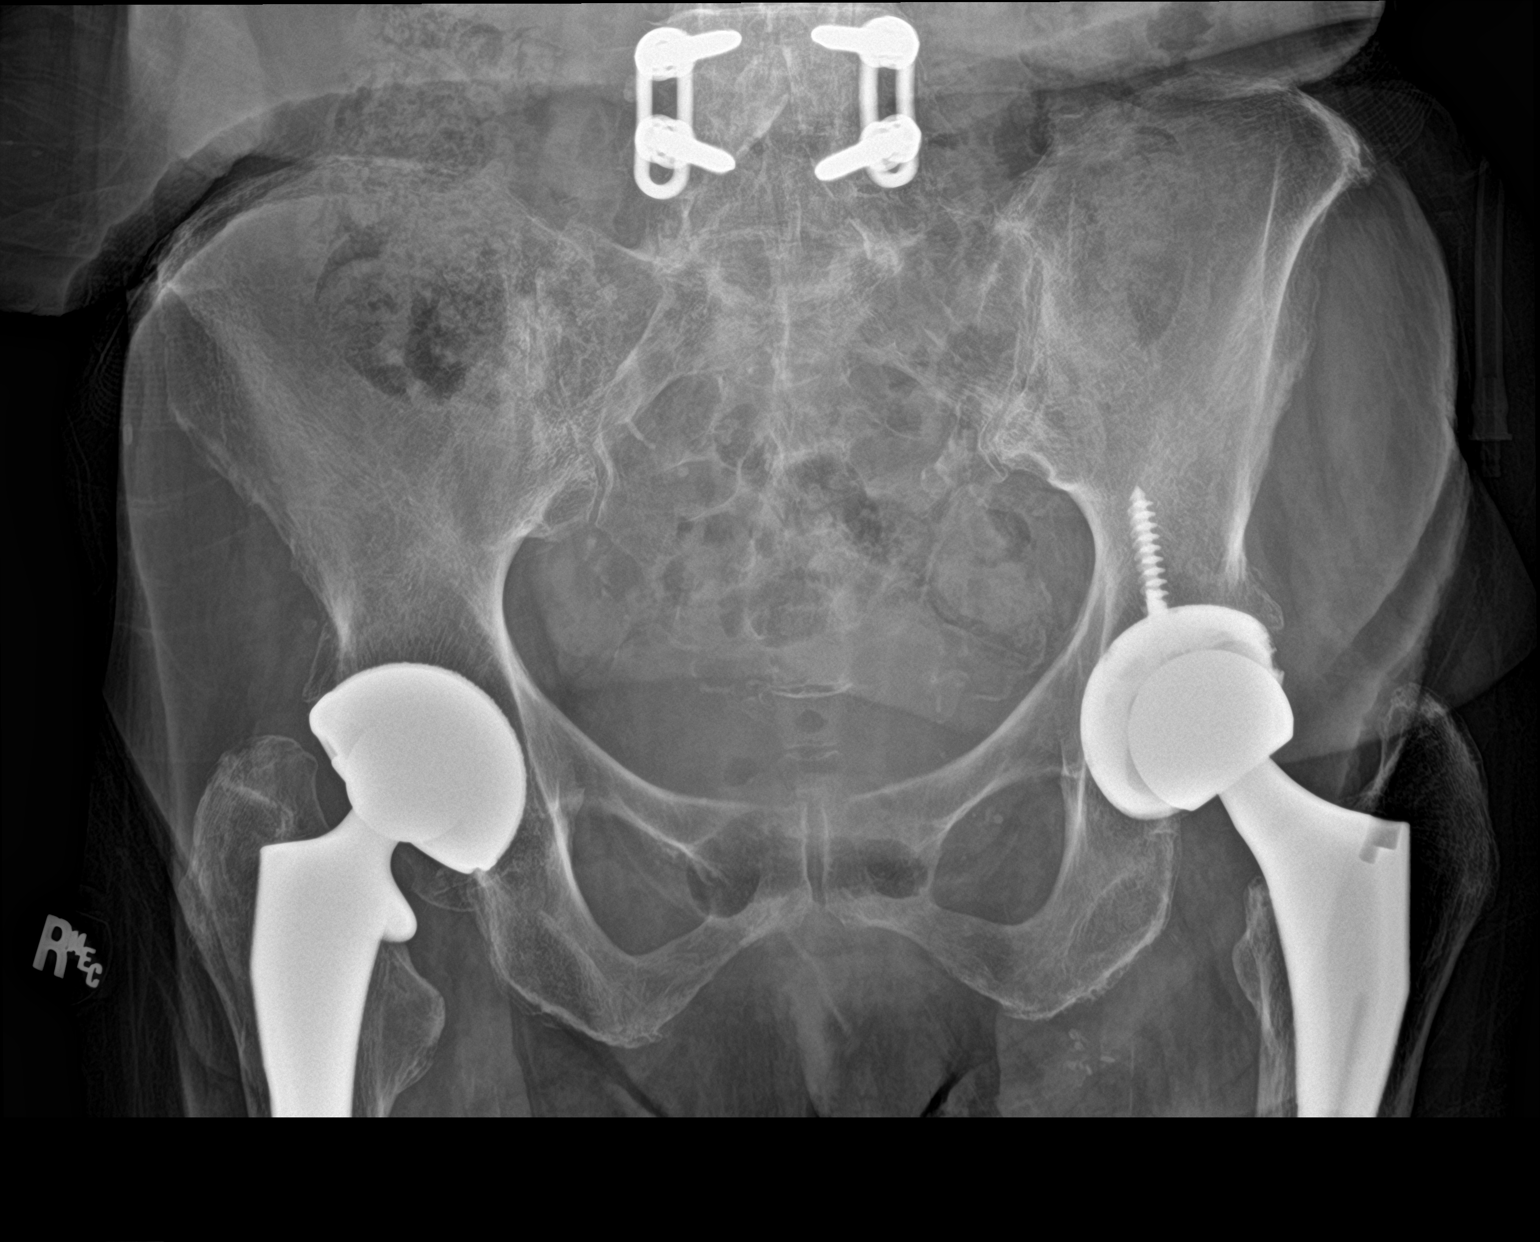

[hip ap]
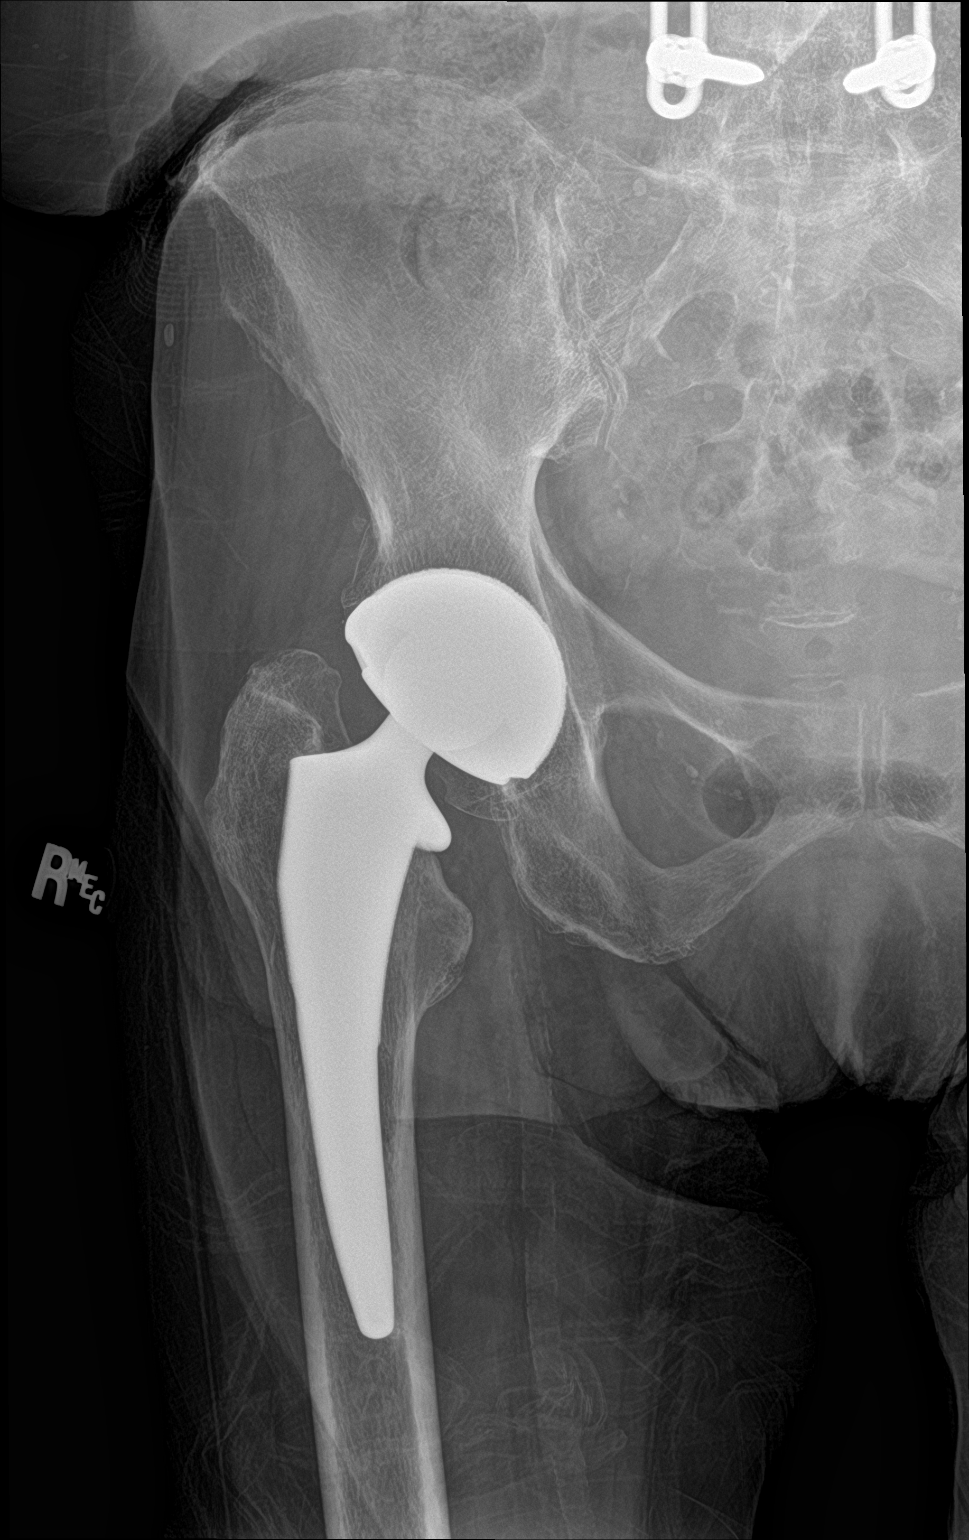

[hip lat]
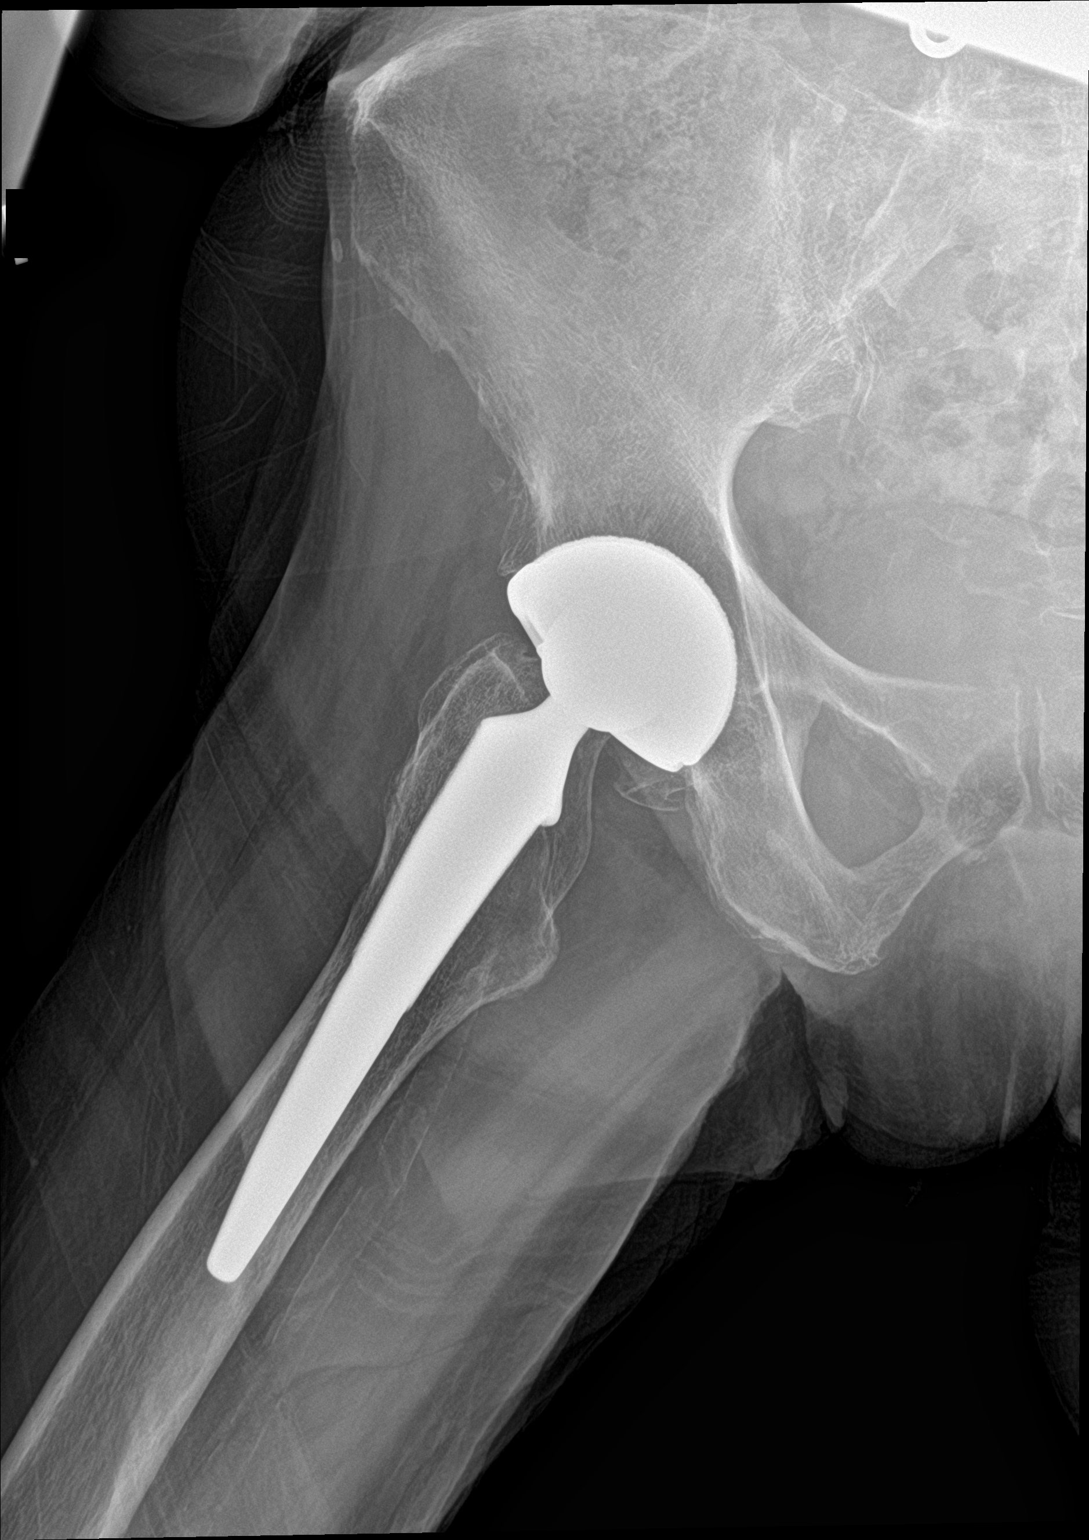

[3 of 3 positions shown; findings below may reference images not displayed]

FINDINGS: Postsurgical changes from right total hip arthroplasty. Arthroplasty
components appear in their expected alignment. Subtle cortical
irregularity of the lesser trochanter, which could reflect
enthesopathic change, although a nondisplaced fracture is not
excluded. No periprosthetic lucency is seen. Bones are diffusely
demineralized. Extensive atherosclerotic vascular calcifications.
IMPRESSION: Subtle cortical irregularity of the lesser trochanter, which could
reflect enthesopathic change, although a nondisplaced fracture is
not excluded. Correlate with point tenderness and consider further
evaluation with metal artifact reduction protocol CT of the hip if
there is high clinical suspicion for fracture.

## 2022-08-01 MED ORDER — SENNOSIDES-DOCUSATE SODIUM 8.6-50 MG PO TABS: 1.0000 | ORAL_TABLET | Freq: Every evening | ORAL | Status: AC | PRN

## 2022-08-01 MED ORDER — TORSEMIDE 10 MG PO TABS: 10.0000 mg | ORAL_TABLET | Freq: Every day | ORAL | 0 refills | Status: AC

## 2022-08-01 MED ORDER — APIXABAN 2.5 MG PO TABS: 2.5000 mg | ORAL_TABLET | Freq: Two times a day (BID) | ORAL | 0 refills | Status: AC

## 2022-08-01 MED ORDER — AMIODARONE HCL 200 MG PO TABS: 200.0000 mg | ORAL_TABLET | Freq: Every day | ORAL | 0 refills | Status: AC

## 2022-08-01 MED ORDER — ALPRAZOLAM 0.25 MG PO TABS: 0.2500 mg | ORAL_TABLET | Freq: Three times a day (TID) | ORAL | 0 refills | Status: AC | PRN

## 2022-08-01 MED ORDER — DIGOXIN 62.5 MCG PO TABS: 0.0625 mg | ORAL_TABLET | Freq: Every day | ORAL | 0 refills | Status: AC

## 2022-08-01 MED ORDER — DEXAMETHASONE 1 MG PO TABS: 1.0000 mg | ORAL_TABLET | Freq: Every day | ORAL | 0 refills | Status: AC

## 2022-08-01 MED ORDER — ALPRAZOLAM 0.25 MG PO TABS: 0.2500 mg | ORAL_TABLET | Freq: Three times a day (TID) | ORAL | 0 refills | Status: DC | PRN

## 2022-08-01 MED ORDER — SPIRONOLACTONE 25 MG PO TABS: 12.5000 mg | ORAL_TABLET | Freq: Every day | ORAL | 0 refills | Status: AC

## 2022-08-01 NOTE — Discharge Summary (Addendum)
Physician Discharge Summary   Patient: Sierra Guzman MRN: [id number]  DOB: December 22, 1940   Admit:     Date of Admission: 07/26/2022 Admitted from: [facility]   Discharge: Date of discharge: 08/01/22 Disposition: Skilled nursing facility Condition at discharge: fair  CODE STATUS: DNR     Discharge Physician: Sierra Nielsen, DO [facility]     PCP: Sierra Chick, MD  Recommendations for Outpatient Follow-up:  Follow up with PCP Sierra Chick, MD in 1-2 weeks Please obtain labs/tests: CBC, BMP in 1-2 weeks Follow up with Dr Sierra Guzman pulmonary 1-2 weeks  Follow up with cardiology in 2-4 weeks  Please follow up on the following pending results: n/a PCP AND OTHER OUTPATIENT PROVIDERS: SEE BELOW FOR SPECIFIC DISCHARGE INSTRUCTIONS PRINTED FOR PATIENT IN ADDITION TO GENERIC AVS PATIENT INFO    Discharge Instructions     Diet - low sodium heart healthy   Complete by: As directed    Discharge instructions   Complete by: As directed    O2 via [place] 4-5 L/min at rest, can increase to 5-7 L/min on exertion. Follow with pulmonology in 1-2 weeks Dr Sierra Guzman.   Discharge wound care:   Complete by: As directed    Pressure wound dressing and frequent repositioning per protocol   Increase activity slowly   Complete by: As directed          Discharge Diagnoses: Principal Problem:   Acute on chronic respiratory failure with hypoxia Active Problems:   SIRS (systemic inflammatory response syndrome)   Interstitial lung disease   Hypertension   Pressure injury of skin   Coronary artery disease   DDD (degenerative disc disease), cervical   Acute hypoxemic respiratory failure   Dysuria   Anxiety       Hospital Course: [name] is a 82 year old female with history of hypertension, non-insulin-dependent diabetes mellitus, hyperlipidemia, neuropathy, depression, anxiety, interstitial lung disease on mycophenolate, GERD, who presents emergency  department for chief concerns of increased oxygen requirement from [employer]. Note chronic O2 use between 2.5-5 depending on the activity. Known pressure injury buttocks. C/o dysuria, subjective fever.  04/17: RR 15, heart rate 92, 115/60, SpO2 of 100% on 6 L [place]. DuoNebs one-time dose, Solu-Medrol 125 mg IV one-time dose, azithromycin and ceftriaxone for CAP.  CT PE with findings of interstitial and airspace opacification with bronchiectasis bilaterally and pulmonary fibrosis.  04/18: seen by pulmonology - added Lasix, started aldactone and torsemide po, continue steroids and CellCept, d/c antibiotics.  04/19: SpO2 100% 4L [place]. BCx NGx2d. Still needing BiPap qhs. PT/OT recs for SNF, TOC consulted 04/20: remains on 4L. About at baseline per pulmonary as of 04/20, but substantial weakness/desaturation/tachycardia w/ minimal exertion. TOC unable to reach her facility anyway to discuss possible accepting her back over the weekend will continue to monitor and O2 support, telemetry  04/21: reducing dexamethasone, stay on this and mycophenylate for next 3 mos, pulmonary s/o.  04/22: stable but on 5L O2, facility will not take her back at that level O2 support, will attempt to wean as able but may be here few more days depending on progress w/ this.  04/23: to [facility] since the ycan offer higher O2 support. Stab;e for discharge.   Consultants:  Pulmonology   Procedures: none      ASSESSMENT & PLAN:   Acute on chronic respiratory failure with hypoxia and hypercapnia - improved, close to baseline now  Likely d/t ILD and CHF Continue oxygen  supplementation to maintain SpO2 greater than 92% BiPAP continuous initially -->  now on [place] Treat underlying causes as below  facility will not take her back at 5L/min O2 support, will attempt to wean as able but may be here few more days depending on progress w/ this.    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 - improved   Last 2D echocardiogram from 03/24 showed an LVEF of 30 to 35% with LV global hypokinesis and grade 1 diastolic dysfunction S/p Lasix  Continue torsemide and spironolactone Optimize blood pressure control - BP occasionally soft, would avoid increase meds at this time  Monitor BMP   Interstitial lung disease Mycophenolate and dexamethasone per pulmonary for next 3 mos Will need pulmonary outpatient f/u   SIRS (systemic inflammatory response syndrome) Sepsis ruled out  Abx d/c   Chronic atrial fibrillation Continue amiodarone, digoxin and Eliquis as primary prevention for an acute stroke Not on beta-blockers d/t intolerance   Hypertension Hydralazine 5 mg IV every 8 hours as needed for SBP greater 175, 4 days ordered   Pressure injury of skin Present on admission Routine care    Coronary artery disease Aspirin 81 mg daily resumed   Anxiety Daughter reports the patient has done well with Valium in the past and that Ativan is too strong for her Diazepam 2.5 mg IV every 6 hours as needed for anxiety, 3 doses ordered.   diazepam can be increased to 5 mg if 2.5 mg is not sufficient for patient   Dysuria Patient endorses dysuria that started over the last 2 days UA was positive for leukocytes, trace.   NO sepsis, No UCx collected, abx d/c, monitor symptoms    DDD (degenerative disc disease), cervical Pain control as needed     DVT prophylaxis: lovenox  Pertinent IV fluids/nutrition: no continuous IV fluids, heart healthy diet  Central lines / invasive devices: none   Code Status: DNR/DNI, per discussion with family and confirmed with daughter Sierra Guzman by admitting hospitalist  ACP documentation reviewed: none on file   Current Admission Status: inpatient   TOC needs / Dispo plan: SNF Barriers to discharge / significant pending items: stable but on 5L O2, facility will not take her back at that level O2 support, will attempt to wean as able but may be here few more days  depending on progress w/ this.             Discharge Instructions  Allergies as of 08/01/2022       Reactions   Morphine And Related Nausea And Vomiting        Medication List     STOP taking these medications    acetaminophen 325 MG tablet Commonly known as: TYLENOL   aspirin EC 81 MG tablet   budesonide 0.25 MG/2ML nebulizer solution Commonly known as: PULMICORT   metFORMIN 850 MG tablet Commonly known as: GLUCOPHAGE   NAC 600 MG Caps Generic drug: Acetylcysteine   Pirfenidone 267 MG Caps   predniSONE 10 MG tablet Commonly known as: DELTASONE   predniSONE 50 MG tablet Commonly known as: DELTASONE   rosuvastatin 40 MG tablet Commonly known as: CRESTOR       TAKE these medications    albuterol (2.5 MG/3ML) 0.083% nebulizer solution Commonly known as: PROVENTIL Take 3 mLs (2.5 mg total) by nebulization every 2 (two) hours as needed for wheezing or shortness of breath.   ALPRAZolam 0.25 MG tablet Commonly known as: XANAX Take 1 tablet (0.25 mg total)  by mouth 3 (three) times daily as needed for anxiety.   amiodarone 200 MG tablet Commonly known as: PACERONE Take 1 tablet (200 mg total) by mouth daily. Start taking on: August 02, 2022   apixaban 2.5 MG Tabs tablet Commonly known as: ELIQUIS Take 1 tablet (2.5 mg total) by mouth 2 (two) times daily.   cholecalciferol 1000 units tablet Commonly known as: VITAMIN D Take 1,000 Units by mouth daily.   dexamethasone 1 MG tablet Commonly known as: DECADRON Take 1 tablet (1 mg total) by mouth daily. Start taking on: August 02, 2022   Digoxin 62.5 MCG Tabs Take 0.0625 mg by mouth daily. Start taking on: August 02, 2022   Ensure Max Protein Liqd Take 330 mLs (11 oz total) by mouth at bedtime.   feeding supplement (GLUCERNA SHAKE) Liqd Take 237 mLs by mouth 2 (two) times daily between meals.   Fish Oil 1000 MG Caps Take 1,000 mg by mouth daily.   gabapentin 100 MG capsule Commonly known  as: NEURONTIN Take 100 mg by mouth 3 (three) times daily.   Guaifenesin 1200 MG Tb12 Take 1,200 mg by mouth every 12 (twelve) hours as needed (cough).   ipratropium-albuterol 0.5-2.5 (3) MG/3ML Soln Commonly known as: DUONEB Take 3 mLs by nebulization every 6 (six) hours.   Jardiance 25 MG Tabs tablet Generic drug: empagliflozin Take 25 mg by mouth daily.   loratadine 10 MG tablet Commonly known as: CLARITIN Take 10 mg by mouth daily.   magnesium oxide 400 MG tablet Commonly known as: MAG-OX Take 1 tablet by mouth daily.   mirtazapine 30 MG tablet Commonly known as: REMERON Take 30 mg by mouth at bedtime.   mycophenolate 250 MG capsule Commonly known as: CELLCEPT Take 1 capsule (250 mg total) by mouth 2 (two) times daily.   omeprazole 20 MG capsule Commonly known as: PRILOSEC Take 20 mg by mouth daily.   One-A-Day Essential Tabs Take 1 tablet by mouth daily.   polyethylene glycol 17 g packet Commonly known as: MIRALAX / GLYCOLAX Take 17 g by mouth daily.   senna-docusate 8.6-50 MG tablet Commonly known as: Senokot-S Take 1 tablet by mouth at bedtime as needed for mild constipation.   sertraline 100 MG tablet Commonly known as: ZOLOFT Take 100 mg by mouth daily.   sodium chloride 0.65 % Soln nasal spray Commonly known as: OCEAN Place 2 sprays into both nostrils as needed for congestion.   spironolactone 25 MG tablet Commonly known as: ALDACTONE Take 0.5 tablets (12.5 mg total) by mouth daily. Start taking on: August 02, 2022   sulfamethoxazole-trimethoprim 400-80 MG tablet Commonly known as: BACTRIM Take 1 tablet by mouth 3 (three) times a week.   torsemide 10 MG tablet Commonly known as: DEMADEX Take 1 tablet (10 mg total) by mouth daily. Start taking on: August 02, 2022               Discharge Care Instructions  (From admission, onward)           Start     Ordered   08/01/22 0000  Discharge wound care:       Comments: Pressure wound  dressing and frequent repositioning per protocol   08/01/22 1052              Allergies  Allergen Reactions   Morphine And Related Nausea And Vomiting     Subjective: pt reports breathin a bit easier today, no other concerns other than chronic pain from pressure sores  on buttock ares    Discharge Exam: BP (!) 106/53 (BP Location: Right Arm)   Pulse 76   Temp 98 F (36.7 C) (Oral)   Resp 18   Ht 5\' 7"  (1.702 m)   Wt 50.4 kg   SpO2 100%   BMI 17.40 kg/m  General: Pt is alert, awake, not in acute distress Cardiovascular: RRR, S1/S2 +, no rubs, no gallops Respiratory: diminished breath sounds bilaterally, +scattered wheezing, no rhonchi Abdominal: Soft, NT, ND, bowel sounds +WNL Extremities: no edema, no cyanosis     The results of significant diagnostics from this hospitalization (including imaging, microbiology, ancillary and laboratory) are listed below for reference.     Microbiology: Recent Results (from the past 240 hour(s))  Resp panel by RT-PCR (RSV, Flu A&B, Covid) Anterior Nasal Swab     Status: None   Collection Time: 07/26/22  3:52 PM   Specimen: Anterior Nasal Swab  Result Value Ref Range Status   SARS Coronavirus 2 by RT PCR NEGATIVE NEGATIVE Final    Comment: (NOTE) SARS-CoV-2 target nucleic acids are NOT DETECTED.  The SARS-CoV-2 RNA is generally detectable in upper respiratory specimens during the acute phase of infection. The lowest concentration of SARS-CoV-2 viral copies this assay can detect is 138 copies/mL. A negative result does not preclude SARS-Cov-2 infection and should not be used as the sole basis for treatment or other patient management decisions. A negative result may occur with  improper specimen collection/handling, submission of specimen other than nasopharyngeal swab, presence of viral mutation(s) within the areas targeted by this assay, and inadequate number of viral copies(<138 copies/mL). A negative result must be  combined with clinical observations, patient history, and epidemiological information. The expected result is Negative.  Fact Sheet for Patients:  BloggerCourse.com  Fact Sheet for Healthcare Providers:  SeriousBroker.it  This test is no t yet approved or cleared by the [place] FDA and  has been authorized for detection and/or diagnosis of SARS-CoV-2 by FDA under an Emergency Use Authorization (EUA). This EUA will remain  in effect (meaning this test can be used) for the duration of the COVID-19 declaration under Section 564(b)(1) of the Act, 21 U.S.C.section 360bbb-3(b)(1), unless the authorization is terminated  or revoked sooner.       Influenza A by PCR NEGATIVE NEGATIVE Final   Influenza B by PCR NEGATIVE NEGATIVE Final    Comment: (NOTE) The Xpert Xpress SARS-CoV-2/FLU/RSV plus assay is intended as an aid in the diagnosis of influenza from Nasopharyngeal swab specimens and should not be used as a sole basis for treatment. Nasal washings and aspirates are unacceptable for Xpert Xpress SARS-CoV-2/FLU/RSV testing.  Fact Sheet for Patients: BloggerCourse.com  Fact Sheet for Healthcare Providers: SeriousBroker.it  This test is not yet approved or cleared by the [place] FDA and has been authorized for detection and/or diagnosis of SARS-CoV-2 by FDA under an Emergency Use Authorization (EUA). This EUA will remain in effect (meaning this test can be used) for the duration of the COVID-19 declaration under Section 564(b)(1) of the Act, 21 U.S.C. section 360bbb-3(b)(1), unless the authorization is terminated or revoked.     Resp Syncytial Virus by PCR NEGATIVE NEGATIVE Final    Comment: (NOTE) Fact Sheet for Patients: BloggerCourse.com  Fact Sheet for Healthcare Providers: SeriousBroker.it  This test is not yet  approved or cleared by the [place] FDA and has been authorized for detection and/or diagnosis of SARS-CoV-2 by FDA under an Emergency Use Authorization (EUA). This EUA will remain  in effect (meaning this test can be used) for the duration of the COVID-19 declaration under Section 564(b)(1) of the Act, 21 U.S.C. section 360bbb-3(b)(1), unless the authorization is terminated or revoked.  Performed at [facility], [address] [place], [place] [address]   Culture, blood (Routine X 2) w Reflex to ID Panel     Status: None   Collection Time: 07/26/22  5:45 PM   Specimen: BLOOD  Result Value Ref Range Status   Specimen Description BLOOD RIGHT ANTECUBITAL  Final   Special Requests   Final    BOTTLES DRAWN AEROBIC AND ANAEROBIC Blood Culture adequate volume   Culture   Final    NO GROWTH 5 DAYS Performed at [facility], [address], [place], [place] [address]    Report Status 07/31/2022 FINAL  Final  Culture, blood (Routine X 2) w Reflex to ID Panel     Status: None   Collection Time: 07/26/22  5:45 PM   Specimen: BLOOD  Result Value Ref Range Status   Specimen Description BLOOD LEFT ANTECUBITAL  Final   Special Requests   Final    BOTTLES DRAWN AEROBIC AND ANAEROBIC Blood Culture adequate volume   Culture   Final    NO GROWTH 5 DAYS Performed at [facility], [address] [place] [address] [place], [place] [address]    Report Status 07/31/2022 FINAL  Final  MRSA Next Gen by PCR, Nasal     Status: None   Collection Time: 07/26/22 10:40 PM   Specimen: Nasal Mucosa; Nasal Swab  Result Value Ref Range Status   MRSA by PCR Next Gen NOT DETECTED NOT DETECTED Final    Comment: (NOTE) The GeneXpert MRSA Assay (FDA approved for NASAL specimens only), is one component of a comprehensive MRSA colonization surveillance program. It is not intended to diagnose MRSA infection nor to guide or monitor treatment for MRSA infections. Test performance is not FDA  approved in patients less than 76 years old. Performed at [facility], [address] [place], [place] [address]      Labs: BNP (last 3 results) Recent Labs    07/26/22 1551  BNP 262.0*   Basic Metabolic Panel: Recent Labs  Lab 07/26/22 1341 07/27/22 0416 07/29/22 0543 07/31/22 0456  NA 138 141 142 142  K 4.3 5.0 3.7 3.7  CL 95* 100 95* 93*  CO2 34* 34* 39* 41*  GLUCOSE 138* 135* 114* 110*  BUN 22 26* 28* 39*  CREATININE 0.79 0.96 0.85 0.75  CALCIUM 8.9 8.9 9.4 9.2   Liver Function Tests: No results for input(s): "AST", "ALT", "ALKPHOS", "BILITOT", "PROT", "ALBUMIN" in the last 168 hours. No results for input(s): "LIPASE", "AMYLASE" in the last 168 hours. No results for input(s): "AMMONIA" in the last 168 hours. CBC: Recent Labs  Lab 07/26/22 1341 07/27/22 0416 07/31/22 0456  WBC 11.6* 8.2 10.1  HGB 10.3* 9.3* 9.5*  HCT 35.3* 31.8* 32.9*  MCV 98.6 98.1 98.8  PLT 226 215 221   Cardiac Enzymes: No results for input(s): "CKTOTAL", "CKMB", "CKMBINDEX", "TROPONINI" in the last 168 hours. BNP: Invalid input(s): "POCBNP" CBG: Recent Labs  Lab 07/31/22 0821 07/31/22 1133 07/31/22 1643 07/31/22 2253 08/01/22 0736  GLUCAP 195* 213* 94 114* 108*   D-Dimer No results for input(s): "DDIMER" in the last 72 hours. Hgb A1c No results for input(s): "HGBA1C" in the last 72 hours. Lipid Profile No results for input(s): "CHOL", "HDL", "LDLCALC", "TRIG", "CHOLHDL", "LDLDIRECT" in the last 72 hours.  Thyroid function studies No results for input(s): "TSH", "T4TOTAL", "T3FREE", "THYROIDAB" in the last 72 hours.  Invalid input(s): "FREET3" Anemia work up No results for input(s): "VITAMINB12", "FOLATE", "FERRITIN", "TIBC", "IRON", "RETICCTPCT" in the last 72 hours. Urinalysis    Component Value Date/Time   COLORURINE YELLOW (A) 07/26/2022 1745   APPEARANCEUR CLEAR (A) 07/26/2022 1745   LABSPEC 1.020 07/26/2022 1745   PHURINE 6.0 07/26/2022 1745    GLUCOSEU >=500 (A) 07/26/2022 1745   HGBUR NEGATIVE 07/26/2022 1745   BILIRUBINUR NEGATIVE 07/26/2022 1745   KETONESUR NEGATIVE 07/26/2022 1745   PROTEINUR NEGATIVE 07/26/2022 1745   NITRITE NEGATIVE 07/26/2022 1745   LEUKOCYTESUR TRACE (A) 07/26/2022 1745   Sepsis Labs Recent Labs  Lab 07/26/22 1341 07/27/22 0416 07/31/22 0456  WBC 11.6* 8.2 10.1   Microbiology Recent Results (from the past 240 hour(s))  Resp panel by RT-PCR (RSV, Flu A&B, Covid) Anterior Nasal Swab     Status: None   Collection Time: 07/26/22  3:52 PM   Specimen: Anterior Nasal Swab  Result Value Ref Range Status   SARS Coronavirus 2 by RT PCR NEGATIVE NEGATIVE Final    Comment: (NOTE) SARS-CoV-2 target nucleic acids are NOT DETECTED.  The SARS-CoV-2 RNA is generally detectable in upper respiratory specimens during the acute phase of infection. The lowest concentration of SARS-CoV-2 viral copies this assay can detect is 138 copies/mL. A negative result does not preclude SARS-Cov-2 infection and should not be used as the sole basis for treatment or other patient management decisions. A negative result may occur with  improper specimen collection/handling, submission of specimen other than nasopharyngeal swab, presence of viral mutation(s) within the areas targeted by this assay, and inadequate number of viral copies(<138 copies/mL). A negative result must be combined with clinical observations, patient history, and epidemiological information. The expected result is Negative.  Fact Sheet for Patients:  BloggerCourse.com  Fact Sheet for Healthcare Providers:  SeriousBroker.it  This test is no t yet approved or cleared by the [place] FDA and  has been authorized for detection and/or diagnosis of SARS-CoV-2 by FDA under an Emergency Use Authorization (EUA). This EUA will remain  in effect (meaning this test can be used) for the duration of  the COVID-19 declaration under Section 564(b)(1) of the Act, 21 U.S.C.section 360bbb-3(b)(1), unless the authorization is terminated  or revoked sooner.       Influenza A by PCR NEGATIVE NEGATIVE Final   Influenza B by PCR NEGATIVE NEGATIVE Final    Comment: (NOTE) The Xpert Xpress SARS-CoV-2/FLU/RSV plus assay is intended as an aid in the diagnosis of influenza from Nasopharyngeal swab specimens and should not be used as a sole basis for treatment. Nasal washings and aspirates are unacceptable for Xpert Xpress SARS-CoV-2/FLU/RSV testing.  Fact Sheet for Patients: BloggerCourse.com  Fact Sheet for Healthcare Providers: SeriousBroker.it  This test is not yet approved or cleared by the [place] FDA and has been authorized for detection and/or diagnosis of SARS-CoV-2 by FDA under an Emergency Use Authorization (EUA). This EUA will remain in effect (meaning this test can be used) for the duration of the COVID-19 declaration under Section 564(b)(1) of the Act, 21 U.S.C. section 360bbb-3(b)(1), unless the authorization is terminated or revoked.     Resp Syncytial Virus by PCR NEGATIVE NEGATIVE Final    Comment: (NOTE) Fact Sheet for Patients: BloggerCourse.com  Fact Sheet for Healthcare Providers: SeriousBroker.it  This test is not yet approved or cleared by the [place] and has been authorized  for detection and/or diagnosis of SARS-CoV-2 by FDA under an Emergency Use Authorization (EUA). This EUA will remain in effect (meaning this test can be used) for the duration of the COVID-19 declaration under Section 564(b)(1) of the Act, 21 U.S.C. section 360bbb-3(b)(1), unless the authorization is terminated or revoked.  Performed at [facility], [address] [place], [place] [address]   Culture, blood (Routine X 2) w Reflex to ID Panel     Status: None    Collection Time: 07/26/22  5:45 PM   Specimen: BLOOD  Result Value Ref Range Status   Specimen Description BLOOD RIGHT ANTECUBITAL  Final   Special Requests   Final    BOTTLES DRAWN AEROBIC AND ANAEROBIC Blood Culture adequate volume   Culture   Final    NO GROWTH 5 DAYS Performed at [facility], [address], [place], [place] [address]    Report Status 07/31/2022 FINAL  Final  Culture, blood (Routine X 2) w Reflex to ID Panel     Status: None   Collection Time: 07/26/22  5:45 PM   Specimen: BLOOD  Result Value Ref Range Status   Specimen Description BLOOD LEFT ANTECUBITAL  Final   Special Requests   Final    BOTTLES DRAWN AEROBIC AND ANAEROBIC Blood Culture adequate volume   Culture   Final    NO GROWTH 5 DAYS Performed at [facility], [address] [place], [place] [address]    Report Status 07/31/2022 FINAL  Final  MRSA Next Gen by PCR, Nasal     Status: None   Collection Time: 07/26/22 10:40 PM   Specimen: Nasal Mucosa; Nasal Swab  Result Value Ref Range Status   MRSA by PCR Next Gen NOT DETECTED NOT DETECTED Final    Comment: (NOTE) The GeneXpert MRSA Assay (FDA approved for NASAL specimens only), is one component of a comprehensive MRSA colonization surveillance program. It is not intended to diagnose MRSA infection nor to guide or monitor treatment for MRSA infections. Test performance is not FDA approved in patients less than 60 years old. Performed at [facility], [address], [place], [place] [address]    Imaging CT Angio Chest Pulmonary Embolism (PE) W or WO Contrast  Result Date: 07/26/2022 CLINICAL DATA:  Chronic respiratory failure with hypoxia. PE suspected. EXAM: CT ANGIOGRAPHY CHEST WITH CONTRAST TECHNIQUE: Multidetector CT imaging of the chest was performed using the standard protocol during bolus administration of intravenous contrast. Multiplanar CT image reconstructions and MIPs were obtained to evaluate the  vascular anatomy. RADIATION DOSE REDUCTION: This exam was performed according to the departmental dose-optimization program which includes automated exposure control, adjustment of the mA and/or kV according to patient size and/or use of iterative reconstruction technique. CONTRAST:  75mL OMNIPAQUE IOHEXOL 350 MG/ML SOLN COMPARISON:  Chest radiographs 07/26/2022 and CTA chest 01/20/2022 FINDINGS: Cardiovascular: Satisfactory opacification of the pulmonary arteries to the segmental level. No acute pulmonary embolism. Cardiomegaly. Trace pericardial effusion. Coronary artery and aortic atherosclerotic calcification. Mediastinum/Nodes: Moderate hiatal hernia. Esophagus is otherwise unremarkable. Similar mediastinal adenopathy. For example a pretracheal node again measures 14 mm in short axis (series 4/image 37). Lungs/Pleura: Diffuse bilateral pulmonary interstitial lung disease with central ground-glass opacity, peripheral reticular opacities and honeycombing. Architectural distortion and bronchiectasis/bronchiolectasis greatest in the lower lungs. Mildly increased ground-glass opacities in the left upper lobe compared to 01/20/2022 may be due to infection or edema. Wispy secretions in the trachea. Upper Abdomen: No acute abnormality. Musculoskeletal: Thoracic spondylosis. No acute  fracture or destructive osseous lesion. Review of the MIP images confirms the above findings. IMPRESSION: 1. No acute pulmonary embolism. 2. Chronic interstitial lung disease. New ground-glass opacities in the left upper lobe may be due to edema or infection. 3. Cardiomegaly and coronary artery calcification. 4. Moderate hiatal hernia. Aortic Atherosclerosis (ICD10-I70.0). Electronically Signed   By: Minerva Fester M.D.   On: 07/26/2022 20:19   DG Chest 2 View  Result Date: 07/26/2022 CLINICAL DATA:  SOB EXAM: CHEST - 2 VIEW COMPARISON:  01/19/2022 FINDINGS: Enlarged cardiac silhouette. Extensive pulmonary interstitial fibrosis. No  pneumothorax or pleural effusion identified. No focal alveolar consolidation. Osseous structures appear grossly intact. IMPRESSION: Enlarged cardiac silhouette.  Extensive pulmonary fibrosis. Electronically Signed   By: Layla Maw M.D.   On: 07/26/2022 14:26      Time coordinating discharge: over 30 minutes  SIGNED:  Sunnie Nielsen DO [facility]

## 2022-08-01 NOTE — TOC Transition Note (Signed)
Transition of Care [facility]) - CM/SW Discharge Note   Patient Details  Name: Sierra Guzman MRN: [id number] Date of Birth: May 08, 1940  Transition of Care [facility]) CM/SW Contact:  Chapman Fitch, RN Phone Number: 08/01/2022, 1:46 PM   Clinical Narrative:    Patient will DC [id number] Anticipated DC date: 08/01/22  Family notified: [name] Transport by: Wendie Simmer  Per MD patient ready for DC to . RN, patient, patient's family, and facility notified of DC. Discharge Summary sent to facility. RN given number for report. DC packet on chart. Ambulance transport requested for patient.  TOC signing off.  Confirmed with MD patient will not require Bipap at discharge DNR and RX signed by MD   Bevelyn Ngo [facility] [phone]    Final next level of care:  (From [facility]) Barriers to Discharge: Other (must enter comment) (Unable to reach facility to confirm readmission needs over the weekend. 07/29/22.)   Patient Goals and CMS Choice      Discharge Placement                         Discharge Plan and Services Additional resources added to the After Visit Summary for                  DME Arranged: N/A DME Agency: NA       HH Arranged: NA HH Agency: NA        Social Determinants of Health (SDOH) Interventions SDOH Screenings   Food Insecurity: No Food Insecurity (07/31/2022)  Housing: Low Risk  (07/31/2022)  Transportation Needs: No Transportation Needs (07/31/2022)  Utilities: Not At Risk (07/31/2022)  Depression (PHQ2-9): Low Risk  (09/14/2020)  Recent Concern: Depression (PHQ2-9) - Medium Risk (08/10/2020)  Tobacco Use: Medium Risk (07/26/2022)     Readmission Risk Interventions    06/13/2021    1:06 PM 03/09/2021   12:37 PM  Readmission Risk Prevention Plan  Transportation Screening Complete Complete  PCP or Specialist Appt within 3-5 Days  Complete  HRI or Home Care Consult  Complete  Social Work Consult for Recovery Care Planning/Counseling  Complete   Palliative Care Screening  Not Applicable  Medication Review [employer]) Complete Complete  PCP or Specialist appointment within 3-5 days of discharge Complete   HRI or Home Care Consult Complete   SW Recovery Care/Counseling Consult Complete   Palliative Care Screening Not Applicable   Skilled Nursing Facility Patient Refused

## 2022-08-01 NOTE — Progress Notes (Signed)
Report called to [facility]. Confirmed delivery of O2 concentrator for patient.

## 2022-08-01 NOTE — Progress Notes (Signed)
Mobility Specialist - Progress Note    08/01/22 0926  Mobility  Activity Turned to right side;Turned to back - supine;Turned to left side  Level of Assistance Minimal assist, patient does 75% or more  Activity Response Tolerated well  Mobility Referral Yes  $Mobility charge 1 Mobility   Author responded to bed alarm. Pt supine in bed upon entry, bedding and linen changed. Pt turned left and right to perform hygiene task, Pt left with needs in reach and bed alarm activated.   Johnathan Hausen Mobility Specialist 08/01/22, 9:32 AM

## 2022-08-06 NOTE — Progress Notes (Deleted)
   Patient ID: Sierra Guzman, female    DOB: Feb 27, 1941, 82 y.o.   MRN: [id number]  Primary cardiologist: Maisie Fus, MD (last seen 01/23) PCP: Ethelda Chick, MD (last seen 02/23)  HPI  Ms Ketring is a 82 y/o female with a history of  TEE 07/14/22. Echo 06/11/21: EF 30-35% with moderately elevated PA pressure of 45.4 mmHg, mild MR and moderate TR.   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 Antibiotics and steroids given for CAP. Initially needed bipap but then weaned to 5L [place]. CT PE with findings of interstitial and airspace opacification with bronchiectasis bilaterally and pulmonary fibrosis. Discharged to SNF. Admitted 07/13/22 due to new hypoxia likely in setting of CHF exacerbation with ILD flare. IV diuresed. Clinically euvolemic & CT scan negative. Family hesitant to increase daily prednisone due to anxiety. Hospital course complicated by chronic constipation with stool ball, requiring manual disimpaction. Initially needed 8L oxygen but then weaned down to 5L.    She presents today for her initial visit with a chief complaint of   Review of Systems    Physical Exam    Assessment & Plan:  1: Chronic heart failure with reduced ejection fraction- - etiology likely ILD - NYHA class - TEE 07/14/22. Echo 06/11/21: EF 30-35% with moderately elevated PA pressure of 45.4 mmHg, mild MR and moderate TR.  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

## 2022-08-07 ENCOUNTER — Telehealth: Payer: Self-pay | Admitting: Family

## 2022-08-07 ENCOUNTER — Encounter: Payer: Medicare HMO | Admitting: Family

## 2022-08-07 NOTE — Telephone Encounter (Signed)
Patient did not show for her initial [facility] appointment on 08/07/22.

## 2022-09-09 DEATH — deceased
